# Patient Record
Sex: Male | Born: 1960 | Race: Black or African American | Hispanic: No | Marital: Single | Smoking: Current every day smoker
Health system: Southern US, Community
[De-identification: ages and names within clinical notes are randomized; demographics above are authoritative.]

## PROBLEM LIST (undated history)

## (undated) DIAGNOSIS — E119 Type 2 diabetes mellitus without complications: Secondary | ICD-10-CM

## (undated) DIAGNOSIS — I2699 Other pulmonary embolism without acute cor pulmonale: Secondary | ICD-10-CM

## (undated) DIAGNOSIS — E785 Hyperlipidemia, unspecified: Secondary | ICD-10-CM

## (undated) DIAGNOSIS — M869 Osteomyelitis, unspecified: Secondary | ICD-10-CM

## (undated) DIAGNOSIS — I639 Cerebral infarction, unspecified: Secondary | ICD-10-CM

## (undated) DIAGNOSIS — I10 Essential (primary) hypertension: Secondary | ICD-10-CM

## (undated) DIAGNOSIS — I739 Peripheral vascular disease, unspecified: Secondary | ICD-10-CM

## (undated) DIAGNOSIS — I5189 Other ill-defined heart diseases: Secondary | ICD-10-CM

## (undated) HISTORY — DX: Other pulmonary embolism without acute cor pulmonale: I26.99

## (undated) HISTORY — PX: FACIAL RECONSTRUCTION SURGERY: SHX631

## (undated) HISTORY — DX: Osteomyelitis, unspecified: M86.9

## (undated) HISTORY — DX: Peripheral vascular disease, unspecified: I73.9

## (undated) HISTORY — DX: Essential (primary) hypertension: I10

## (undated) HISTORY — DX: Other ill-defined heart diseases: I51.89

## (undated) HISTORY — DX: Hyperlipidemia, unspecified: E78.5

---
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
## 2006-02-14 ENCOUNTER — Emergency Department: Payer: Self-pay | Admitting: Emergency Medicine

## 2013-12-29 ENCOUNTER — Emergency Department: Payer: Self-pay | Admitting: Emergency Medicine

## 2013-12-29 LAB — URINALYSIS, COMPLETE
BILIRUBIN, UR: NEGATIVE
BLOOD: NEGATIVE
Bacteria: NONE SEEN
KETONE: NEGATIVE
Leukocyte Esterase: NEGATIVE
Nitrite: NEGATIVE
Ph: 5 (ref 4.5–8.0)
Protein: NEGATIVE
Specific Gravity: 1.017 (ref 1.003–1.030)
Squamous Epithelial: NONE SEEN

## 2013-12-29 LAB — TROPONIN I: Troponin-I: <0.02 ng/mL

## 2013-12-29 LAB — BASIC METABOLIC PANEL
Anion Gap: 8 (ref 7–16)
BUN: 8 mg/dL (ref 7–18)
CALCIUM: 8.3 mg/dL — AB (ref 8.5–10.1)
CREATININE: 0.86 mg/dL (ref 0.60–1.30)
Chloride: 99 mmol/L (ref 98–107)
Co2: 28 mmol/L (ref 21–32)
Glucose: 234 mg/dL — ABNORMAL HIGH (ref 65–99)
Osmolality: 276 (ref 275–301)
Potassium: 3.8 mmol/L (ref 3.5–5.1)
SODIUM: 135 mmol/L — AB (ref 136–145)

## 2013-12-29 LAB — CBC WITH DIFFERENTIAL/PLATELET
BASOS PCT: 0.7 %
Basophil #: 0 10*3/uL (ref 0.0–0.1)
EOS ABS: 0.1 10*3/uL (ref 0.0–0.7)
Eosinophil %: 2.2 %
HCT: 45.2 % (ref 40.0–52.0)
HGB: 14.8 g/dL (ref 13.0–18.0)
LYMPHS PCT: 36 %
Lymphocyte #: 2.3 10*3/uL (ref 1.0–3.6)
MCH: 29.9 pg (ref 26.0–34.0)
MCHC: 32.8 g/dL (ref 32.0–36.0)
MCV: 91 fL (ref 80–100)
Monocyte #: 0.3 x10 3/mm (ref 0.2–1.0)
Monocyte %: 4.9 %
NEUTROS ABS: 3.6 10*3/uL (ref 1.4–6.5)
NEUTROS PCT: 56.2 %
Platelet: 143 10*3/uL — ABNORMAL LOW (ref 150–440)
RBC: 4.96 10*6/uL (ref 4.40–5.90)
RDW: 12.8 % (ref 11.5–14.5)
WBC: 6.3 10*3/uL (ref 3.8–10.6)

## 2013-12-29 LAB — HEMOGLOBIN A1C: Hemoglobin A1C: 8.4 % — ABNORMAL HIGH (ref 4.2–6.3)

## 2014-10-30 ENCOUNTER — Emergency Department: Payer: No Typology Code available for payment source

## 2014-10-30 ENCOUNTER — Inpatient Hospital Stay
Admission: EM | Admit: 2014-10-30 | Discharge: 2014-11-03 | DRG: 252 | Disposition: A | Discharge status: Home / Self Care | Payer: No Typology Code available for payment source | Attending: Specialist | Admitting: Specialist

## 2014-10-30 ENCOUNTER — Encounter: Payer: Self-pay | Admitting: Emergency Medicine

## 2014-10-30 DIAGNOSIS — E119 Type 2 diabetes mellitus without complications: Secondary | ICD-10-CM

## 2014-10-30 DIAGNOSIS — Z950 Presence of cardiac pacemaker: Secondary | ICD-10-CM

## 2014-10-30 DIAGNOSIS — M869 Osteomyelitis, unspecified: Secondary | ICD-10-CM | POA: Diagnosis present

## 2014-10-30 DIAGNOSIS — E1152 Type 2 diabetes mellitus with diabetic peripheral angiopathy with gangrene: Secondary | ICD-10-CM | POA: Diagnosis present

## 2014-10-30 DIAGNOSIS — E876 Hypokalemia: Secondary | ICD-10-CM | POA: Diagnosis present

## 2014-10-30 DIAGNOSIS — A480 Gas gangrene: Secondary | ICD-10-CM | POA: Diagnosis present

## 2014-10-30 DIAGNOSIS — R739 Hyperglycemia, unspecified: Secondary | ICD-10-CM

## 2014-10-30 DIAGNOSIS — F17200 Nicotine dependence, unspecified, uncomplicated: Secondary | ICD-10-CM | POA: Diagnosis present

## 2014-10-30 DIAGNOSIS — E1169 Type 2 diabetes mellitus with other specified complication: Secondary | ICD-10-CM | POA: Diagnosis present

## 2014-10-30 DIAGNOSIS — I96 Gangrene, not elsewhere classified: Secondary | ICD-10-CM | POA: Diagnosis present

## 2014-10-30 DIAGNOSIS — Z716 Tobacco abuse counseling: Secondary | ICD-10-CM

## 2014-10-30 DIAGNOSIS — E1165 Type 2 diabetes mellitus with hyperglycemia: Secondary | ICD-10-CM | POA: Diagnosis present

## 2014-10-30 DIAGNOSIS — Z9114 Patient's other noncompliance with medication regimen: Secondary | ICD-10-CM | POA: Diagnosis not present

## 2014-10-30 HISTORY — DX: Type 2 diabetes mellitus without complications: E11.9

## 2014-10-30 LAB — CBC WITH DIFFERENTIAL/PLATELET
Basophils Absolute: 0.1 10*3/uL (ref 0–0.1)
Basophils Relative: 1 %
Eosinophils Absolute: 0.1 10*3/uL (ref 0–0.7)
Eosinophils Relative: 1 %
HCT: 38.1 % — ABNORMAL LOW (ref 40.0–52.0)
Hemoglobin: 12.6 g/dL — ABNORMAL LOW (ref 13.0–18.0)
LYMPHS PCT: 11 %
Lymphs Abs: 1.3 10*3/uL (ref 1.0–3.6)
MCH: 29.2 pg (ref 26.0–34.0)
MCHC: 33.1 g/dL (ref 32.0–36.0)
MCV: 88.4 fL (ref 80.0–100.0)
MONOS PCT: 6 %
Monocytes Absolute: 0.6 10*3/uL (ref 0.2–1.0)
NEUTROS ABS: 9.2 10*3/uL — AB (ref 1.4–6.5)
Neutrophils Relative %: 81 %
PLATELETS: 217 10*3/uL (ref 150–440)
RBC: 4.31 MIL/uL — ABNORMAL LOW (ref 4.40–5.90)
RDW: 12.8 % (ref 11.5–14.5)
WBC: 11.3 10*3/uL — AB (ref 3.8–10.6)

## 2014-10-30 LAB — BASIC METABOLIC PANEL
Anion gap: 8 (ref 5–15)
BUN: 13 mg/dL (ref 6–20)
CO2: 27 mmol/L (ref 22–32)
Calcium: 9 mg/dL (ref 8.9–10.3)
Chloride: 100 mmol/L — ABNORMAL LOW (ref 101–111)
Creatinine, Ser: 0.74 mg/dL (ref 0.61–1.24)
GLUCOSE: 114 mg/dL — AB (ref 65–99)
Potassium: 3.5 mmol/L (ref 3.5–5.1)
Sodium: 135 mmol/L (ref 135–145)

## 2014-10-30 LAB — GLUCOSE, CAPILLARY
GLUCOSE-CAPILLARY: 101 mg/dL — AB (ref 65–99)
GLUCOSE-CAPILLARY: 148 mg/dL — AB (ref 65–99)
Glucose-Capillary: 95 mg/dL (ref 65–99)

## 2014-10-30 LAB — LACTIC ACID, PLASMA: Lactic Acid, Venous: 1.2 mmol/L (ref 0.5–2.0)

## 2014-10-30 MED ORDER — ENOXAPARIN SODIUM 40 MG/0.4ML ~~LOC~~ SOLN
40.0000 mg | SUBCUTANEOUS | Status: DC
  Administered 2014-10-30 – 2014-11-01 (×2): 40 mg via SUBCUTANEOUS
  Filled 2014-10-30 (×3): qty 0.4

## 2014-10-30 MED ORDER — SODIUM CHLORIDE 0.9 % IV BOLUS (SEPSIS)
1000.0000 mL | Freq: Once | INTRAVENOUS | Status: AC
  Administered 2014-10-30: 1000 mL via INTRAVENOUS

## 2014-10-30 MED ORDER — SODIUM CHLORIDE 0.9 % IV SOLN
INTRAVENOUS | Status: DC
  Administered 2014-10-30: 15:00:00 via INTRAVENOUS

## 2014-10-30 MED ORDER — INSULIN ASPART 100 UNIT/ML ~~LOC~~ SOLN
0.0000 [IU] | Freq: Every day | SUBCUTANEOUS | Status: DC
  Administered 2014-11-02: 2 [IU] via SUBCUTANEOUS
  Filled 2014-10-30: qty 2
  Filled 2014-10-30: qty 3

## 2014-10-30 MED ORDER — PIPERACILLIN-TAZOBACTAM 3.375 G IVPB
3.3750 g | Freq: Three times a day (TID) | INTRAVENOUS | Status: DC
  Administered 2014-10-30 – 2014-10-31 (×2): 3.375 g via INTRAVENOUS
  Administered 2014-10-31: 2.25 g via INTRAVENOUS
  Administered 2014-10-31 – 2014-11-03 (×8): 3.375 g via INTRAVENOUS
  Filled 2014-10-30 (×14): qty 50

## 2014-10-30 MED ORDER — VANCOMYCIN HCL 10 G IV SOLR
1250.0000 mg | Freq: Once | INTRAVENOUS | Status: AC
  Administered 2014-10-30: 1250 mg via INTRAVENOUS
  Filled 2014-10-30: qty 1250

## 2014-10-30 MED ORDER — PIPERACILLIN-TAZOBACTAM 3.375 G IVPB 30 MIN
3.3750 g | Freq: Once | INTRAVENOUS | Status: AC
  Administered 2014-10-30: 3.375 g via INTRAVENOUS
  Filled 2014-10-30: qty 50

## 2014-10-30 MED ORDER — ACETAMINOPHEN 325 MG PO TABS
650.0000 mg | ORAL_TABLET | Freq: Four times a day (QID) | ORAL | Status: DC | PRN
  Administered 2014-11-01: 650 mg via ORAL
  Filled 2014-10-30: qty 2

## 2014-10-30 MED ORDER — VANCOMYCIN HCL 10 G IV SOLR
1250.0000 mg | Freq: Two times a day (BID) | INTRAVENOUS | Status: DC
  Administered 2014-10-30 – 2014-11-02 (×6): 1250 mg via INTRAVENOUS
  Filled 2014-10-30 (×8): qty 1250

## 2014-10-30 MED ORDER — SENNOSIDES-DOCUSATE SODIUM 8.6-50 MG PO TABS: 1.0000 | ORAL_TABLET | Freq: Every evening | ORAL | Status: DC | PRN

## 2014-10-30 MED ORDER — INFLUENZA VAC SPLIT QUAD 0.5 ML IM SUSY
0.5000 mL | PREFILLED_SYRINGE | INTRAMUSCULAR | Status: AC
Start: 2014-10-31 — End: 2014-11-01
  Administered 2014-11-01: 0.5 mL via INTRAMUSCULAR
  Filled 2014-10-30: qty 0.5

## 2014-10-30 MED ORDER — ACETAMINOPHEN 650 MG RE SUPP: 650.0000 mg | Freq: Four times a day (QID) | RECTAL | Status: DC | PRN

## 2014-10-30 MED ORDER — ONDANSETRON HCL 4 MG PO TABS: 4.0000 mg | ORAL_TABLET | Freq: Four times a day (QID) | ORAL | Status: DC | PRN

## 2014-10-30 MED ORDER — VANCOMYCIN HCL IN DEXTROSE 1-5 GM/200ML-% IV SOLN: 1000.0000 mg | INTRAVENOUS | Status: DC

## 2014-10-30 MED ORDER — INSULIN ASPART 100 UNIT/ML ~~LOC~~ SOLN
0.0000 [IU] | Freq: Three times a day (TID) | SUBCUTANEOUS | Status: DC
  Administered 2014-10-30 – 2014-11-02 (×2): 1 [IU] via SUBCUTANEOUS
  Administered 2014-11-02 – 2014-11-03 (×2): 2 [IU] via SUBCUTANEOUS
  Filled 2014-10-30 (×2): qty 1

## 2014-10-30 MED ORDER — PIPERACILLIN-TAZOBACTAM 3.375 G IVPB
3.3750 g | Freq: Four times a day (QID) | INTRAVENOUS | Status: DC
Start: 2014-10-30 — End: 2014-10-30

## 2014-10-30 MED ORDER — ONDANSETRON HCL 4 MG/2ML IJ SOLN: 4.0000 mg | Freq: Four times a day (QID) | INTRAMUSCULAR | Status: DC | PRN

## 2014-10-30 NOTE — ED Provider Notes (Addendum)
CSN: [id number]     Arrival date & time 10/30/14  1015 History   First MD Initiated Contact with Patient 10/30/14 1035     Chief Complaint  Patient presents with  . Leg Swelling     (Consider location/radiation/quality/duration/timing/severity/associated sxs/prior Treatment) The history is provided by the patient.  Jourdon Laflash is a 54 y.o. male hx of DM not on meds here with R foot pain. Patient states that he stubbed his toe about 2 weeks ago. States that he had progressively got more painful and swollen. States that the big toe turned black as well with with drainage. Denies fever or chills. He doesn't check his blood sugar and is not taking anything for diabetes.   History reviewed. No pertinent past medical history. History reviewed. No pertinent past surgical history. History reviewed. No pertinent family history. Social History  Substance Use Topics  . Smoking status: Current Every Day Smoker  . Smokeless tobacco: None  . Alcohol Use: No    Review of Systems  Musculoskeletal:       R foot pain   All other systems reviewed and are negative.     Allergies  Review of patient's allergies indicates no known allergies.  Home Medications   Prior to Admission medications   Medication Sig Start Date End Date Taking? Authorizing Provider  naproxen sodium (ANAPROX) 220 MG tablet Take 220 mg by mouth daily as needed (pain).   Yes Historical Provider, MD   BP 136/81 mmHg  Pulse 78  Temp(Src) 98.2 F (36.8 C) (Oral)  Resp 18  Ht 5\' 6"  (1.676 m)  Wt 165 lb (74.844 kg)  BMI 26.64 kg/m2  SpO2 100% Physical Exam  Constitutional: He is oriented to person, place, and time.  Chronically ill   HENT:  Head: Normocephalic.  Mouth/Throat: Oropharynx is clear and moist.  Eyes: Conjunctivae are normal. Pupils are equal, round, and reactive to light.  Neck: Normal range of motion. Neck supple.  Cardiovascular: Normal rate, regular rhythm and normal heart sounds.    Pulmonary/Chest: Effort normal and breath sounds normal. No respiratory distress. He has no wheezes. He has no rales.  Abdominal: Soft. Bowel sounds are normal. He exhibits no distension. There is no tenderness. There is no rebound.  Musculoskeletal: Normal range of motion.  R big toe with obvious gangrene, cellulitis to mid foot. 2+ pedal pulse. Foul smelling. R calf swollen   Neurological: He is alert and oriented to person, place, and time.  Skin: Skin is warm. There is erythema.  Psychiatric: He has a normal mood and affect. His behavior is normal. Judgment and thought content normal.  Nursing note and vitals reviewed.   ED Course  Procedures (including critical care time) Labs Review Labs Reviewed  CBC WITH DIFFERENTIAL/PLATELET - Abnormal; Notable for the following:    WBC 11.3 (*)    RBC 4.31 (*)    Hemoglobin 12.6 (*)    HCT 38.1 (*)    Neutro Abs 9.2 (*)    All other components within normal limits  BASIC METABOLIC PANEL - Abnormal; Notable for the following:    Chloride 100 (*)    Glucose, Bld 114 (*)    All other components within normal limits  GLUCOSE, CAPILLARY - Abnormal; Notable for the following:    Glucose-Capillary 101 (*)    All other components within normal limits  CULTURE, BLOOD (ROUTINE X 2)  CULTURE, BLOOD (ROUTINE X 2)  LACTIC ACID, PLASMA  CBG MONITORING, ED    Imaging  Review US Venous Img Lower Unilateral Right  10/30/2014   CLINICAL DATA:  Right lower extremity pain and swelling for 2 weeks. Evaluate for DVT.  EXAM: RIGHT LOWER EXTREMITY VENOUS DOPPLER ULTRASOUND  TECHNIQUE: Gray-scale sonography with graded compression, as well as color Doppler and duplex ultrasound were performed to evaluate the lower extremity deep venous systems from the level of the common femoral vein and including the common femoral, femoral, profunda femoral, popliteal and calf veins including the posterior tibial, peroneal and gastrocnemius veins when visible. The  superficial great saphenous vein was also interrogated. Spectral Doppler was utilized to evaluate flow at rest and with distal augmentation maneuvers in the common femoral, femoral and popliteal veins.  COMPARISON:  Right foot radiographs - 10/30/2014  FINDINGS: Contralateral Common Femoral Vein: Respiratory phasicity is normal and symmetric with the symptomatic side. No evidence of thrombus. Normal compressibility.  Common Femoral Vein: No evidence of thrombus. Normal compressibility, respiratory phasicity and response to augmentation.  Saphenofemoral Junction: No evidence of thrombus. Normal compressibility and flow on color Doppler imaging.  Profunda Femoral Vein: No evidence of thrombus. Normal compressibility and flow on color Doppler imaging.  Femoral Vein: No evidence of thrombus. Normal compressibility, respiratory phasicity and response to augmentation.  Popliteal Vein: No evidence of thrombus. Normal compressibility, respiratory phasicity and response to augmentation.  Calf Veins: No evidence of thrombus. Normal compressibility and flow on color Doppler imaging.  Superficial Great Saphenous Vein: No evidence of thrombus. Normal compressibility and flow on color Doppler imaging.  Venous Reflux:  None.  Other Findings: Note is made of a benign appearing, non pathologically enlarged likely reactive right inguinal lymph node (image 41).  IMPRESSION: No evidence of DVT within right lower extremity.   Electronically Signed   By: Sandi Mariscal M.D.   On: 10/30/2014 12:13   Dg Foot Complete Right  10/30/2014   CLINICAL DATA:  Foot swelling for 2 weeks. Redness. Ulcerations. Gangrenous appearance.  EXAM: RIGHT FOOT COMPLETE - 3+ VIEW  COMPARISON:  None.  FINDINGS: New diffuse soft tissue swelling. Mild amount of subcutaneous air about the great toe cannot be excluded. This to be consistent with clinical diagnosis of a a gangrenous foot. Infection within the soft tissues of the right great toe should be considered  . Subtle erosion of the distal right first metatarsal noted. This could represent a focal area of osteomyelitis. Joint space involvement is most likely present.  IMPRESSION: 1. Diffuse soft tissue swelling. Soft tissue air is noted about the right great toe suggesting a gangrenous toe with soft tissue infection as clinically suggested. 2. Focal lucency noted the distal right first metatarsal consistent with osteomyelitis. Involvement of the joint space is most likely present. Gadolinium-enhanced MRI of the right foot can be obtained for further evaluation as needed. These results will be called to the ordering clinician or representative by the Radiologist Assistant, and communication documented in the PACS or zVision Dashboard.   Electronically Signed   By: Marcello Moores  Register   On: 10/30/2014 11:14   I have personally reviewed and evaluated these images and lab results as part of my medical decision-making.   EKG Interpretation None     ED ECG REPORT I, Joud Ingwersen, the attending physician, personally viewed and interpreted this ECG.   Date: 10/30/2014  EKG Time: 13:30  Rate: \77  Rhythm: normal EKG, normal sinus rhythm  Axis: normal  Intervals:none  ST&T Change: none    MDM   Final diagnoses:  None  Jediah Gallis is a 54 y.o. male here with gangrene R big toe. Will do sepsis workup and get DVT study and xray.  1:20 PM Xray showed gangrene with air. Called podiatry, Dr. Cleda Mccreedy, who will see patient. WBC 11. CBG 100. Given vanc/zosyn. Hospitalist to admit.     Wandra Arthurs, MD 10/30/14 [place] Leeann Bady, MD 10/30/14 1330

## 2014-10-30 NOTE — Progress Notes (Signed)
ANTIBIOTIC CONSULT NOTE - INITIAL  Pharmacy Consult for Zosyn/Vancomycin Indication: Diabetic Foot Infection/Possible Osteomyelitis  No Known Allergies  Patient Measurements: Height: 5\' 6"  (167.6 cm) Weight: 165 lb (74.844 kg) IBW/kg (Calculated) : 63.8 Adjusted Body Weight:    Vital Signs: Temp: 98.2 F (36.8 C) (10/07 1032) Temp Source: Oral (10/07 1032) BP: 136/81 mmHg (10/07 1301) Pulse Rate: 78 (10/07 1301)  Labs:  Recent Labs  10/30/14 1119  WBC 11.3*  HGB 12.6*  PLT 217  CREATININE 0.74   Estimated Creatinine Clearance: 97.5 mL/min (by C-G formula based on Cr of 0.74).   Microbiology: No results found for this or any previous visit (from the past 720 hour(s)).  Medical History: History reviewed. No pertinent past medical history.  Medications:  Scheduled:  . enoxaparin (LOVENOX) injection  40 mg Subcutaneous Q24H  . insulin aspart  0-5 Units Subcutaneous QHS  . insulin aspart  0-9 Units Subcutaneous TID WC   Anti-infectives    Start     Dose/Rate Route Frequency Ordered Stop   10/31/14 0000  vancomycin (VANCOCIN) IVPB 1000 mg/200 mL premix  Status:  Discontinued     1,000 mg 200 mL/hr over 60 Minutes Intravenous Every 24 hours 10/30/14 1325 10/30/14 1420   10/30/14 1500  vancomycin (VANCOCIN) 1,250 mg in sodium chloride 0.9 % 250 mL IVPB     1,250 mg 166.7 mL/hr over 90 Minutes Intravenous  Once 10/30/14 1420     10/30/14 1415  piperacillin-tazobactam (ZOSYN) IVPB 3.375 g     3.375 g 100 mL/hr over 30 Minutes Intravenous  Once 10/30/14 1413     10/30/14 1330  piperacillin-tazobactam (ZOSYN) IVPB 3.375 g  Status:  Discontinued     3.375 g 12.5 mL/hr over 240 Minutes Intravenous 4 times per day 10/30/14 1325 10/30/14 1413     Assessment: 54 yo male with gangrenous right great toe, possible Osteomyelitis. Hx DM. Possible amputation?  Ke 0.085  T1/2 8.15  Vd 52.36  Goal of Therapy:  Vancomycin trough level 15-20 mcg/ml  Plan:  Measure  antibiotic drug levels at steady state Follow up culture results Will order EI Zosyn 3.375gm IV q8h  Will order Vancomycin 1250mg  IV Q12h with 2nd dose to start ~7 hrs after 1st for stacked dosing. Will order Vancomycin trough prior to 5th dose on 10/9 at 0930.  Chinita Greenland PharmD Clinical Pharmacist 10/30/2014 2:36 PM

## 2014-10-30 NOTE — Consult Note (Signed)
[facility] Vascular Consult Note  MRN : [id number]  [name] is a 54 y.o. (Oct 18, 1960) male who presents with chief complaint of  Chief Complaint  Patient presents with  . Leg Swelling  .  History of Present Illness: Patient presents with 3 week history of foul smelling, dark discoloration of the right great toe.  This is mildly painful.  He has a fever with signs of infection and was found to have gangrenous changes.  For OR debridement tomorrow.  We are asked to see him to assess perfusion.  No claudication.  No ischemic rest pain  Current Facility-Administered Medications  Medication Dose Route Frequency Provider Last Rate Last Dose  . 0.9 %  sodium chloride infusion   Intravenous Continuous Epifanio Lesches, MD 75 mL/hr at 10/30/14 1454    . acetaminophen (TYLENOL) tablet 650 mg  650 mg Oral Q6H PRN Epifanio Lesches, MD       Or  . acetaminophen (TYLENOL) suppository 650 mg  650 mg Rectal Q6H PRN Epifanio Lesches, MD      . enoxaparin (LOVENOX) injection 40 mg  40 mg Subcutaneous Q24H Epifanio Lesches, MD   40 mg at 10/30/14 1618  . insulin aspart (novoLOG) injection 0-5 Units  0-5 Units Subcutaneous QHS Epifanio Lesches, MD      . insulin aspart (novoLOG) injection 0-9 Units  0-9 Units Subcutaneous TID WC Epifanio Lesches, MD   1 Units at 10/30/14 1644  . ondansetron (ZOFRAN) tablet 4 mg  4 mg Oral Q6H PRN Epifanio Lesches, MD       Or  . ondansetron (ZOFRAN) injection 4 mg  4 mg Intravenous Q6H PRN Epifanio Lesches, MD      . senna-docusate (Senokot-S) tablet 1 tablet  1 tablet Oral QHS PRN Epifanio Lesches, MD        History reviewed. No pertinent past medical history.  History reviewed. No pertinent past surgical history.  Social History Social History  Substance Use Topics  . Smoking status: Current Every Day Smoker  . Smokeless tobacco: None  . Alcohol Use: No  No IVDU  Family History No bleeding disorders,  clotting disorders, or autoimmune diseases  No Known Allergies   REVIEW OF SYSTEMS (Negative unless checked)  Constitutional: [] Weight loss  [] Fever  [] Chills Cardiac: [] Chest pain   [] Chest pressure   [] Palpitations   [] Shortness of breath when laying flat   [] Shortness of breath at rest   [] Shortness of breath with exertion. Vascular:  [] Pain in legs with walking   [] Pain in legs at rest   [] Pain in legs when laying flat   [] Claudication   [] Pain in feet when walking  [] Pain in feet at rest  [] Pain in feet when laying flat   [] History of DVT   [] Phlebitis   [x] Swelling in legs   [] Varicose veins   [x] Non-healing ulcers Pulmonary:   [] Uses home oxygen   [] Productive cough   [] Hemoptysis   [] Wheeze  [] COPD   [] Asthma Neurologic:  [] Dizziness  [] Blackouts   [] Seizures   [] History of stroke   [] History of TIA  [] Aphasia   [] Temporary blindness   [] Dysphagia   [] Weakness or numbness in arms   [] Weakness or numbness in legs Musculoskeletal:  [] Arthritis   [] Joint swelling   [] Joint pain   [] Low back pain Hematologic:  [] Easy bruising  [] Easy bleeding   [] Hypercoagulable state   [] Anemic  [] Hepatitis Gastrointestinal:  [] Blood in stool   [] Vomiting blood  [] Gastroesophageal reflux/heartburn   [] Difficulty  swallowing. Genitourinary:  [] Chronic kidney disease   [] Difficult urination  [] Frequent urination  [] Burning with urination   [] Blood in urine Skin:  [] Rashes   [x] Ulcers   [] Wounds Psychological:  [] History of anxiety   []  History of major depression.  Physical Examination  Filed Vitals:   10/30/14 1032 10/30/14 1301 10/30/14 1450 10/30/14 1506  BP: 139/76 136/81 122/69 136/68  Pulse: 81 78 72 82  Temp: 98.2 F (36.8 C)   98.5 F (36.9 C)  TempSrc: Oral   Oral  Resp: 20 18 18 18   Height: 5\' 6"  (1.676 m)     Weight: 74.844 kg (165 lb)     SpO2: 100% 100% 99% 100%   Body mass index is 26.64 kg/(m^2). Gen:  WD/WN, NAD Head: [place]/AT, No temporalis wasting. Prominent temp pulse not  noted. Ear/Nose/Throat: Hearing grossly intact, nares w/o erythema or drainage, oropharynx w/o Erythema/Exudate Eyes: PERRLA, EOMI.  Neck: Supple, no nuchal rigidity.  No bruit or JVD.  Pulmonary:  Good air movement, clear to auscultation bilaterally.  Cardiac: RRR, normal S1, S2, no Murmurs, rubs or gallops. Vascular:  Vessel Right Left  Radial Palpable Palpable  Ulnar Palpable Palpable  Brachial Palpable Palpable  Carotid Palpable, without bruit Palpable, without bruit  Aorta Not palpable N/A  Femoral Palpable Palpable  Popliteal Weakly Palpable Palpable  PT Weakly Palpable Palpable  DP Palpable Palpable   Gastrointestinal: soft, non-tender/non-distended. No guarding/reflex. No masses, surgical incisions, or scars. Musculoskeletal: M/S 5/5 throughout.  Extremities without ischemic changes.  No deformity or atrophy. RLE 2+ edema. Neurologic: CN 2-12 intact. Pain and light touch intact in extremities.  Symmetrical.  Speech is fluent. Motor exam as listed above. Psychiatric: Judgment intact, Mood & affect appropriate for pt's clinical situation. Dermatologic: Right great toe with gangrenous changes and a foul odor. Lymph : No Cervical, Axillary, or Inguinal lymphadenopathy.      CBC Lab Results  Component Value Date   WBC 11.3* 10/30/2014   HGB 12.6* 10/30/2014   HCT 38.1* 10/30/2014   MCV 88.4 10/30/2014   PLT 217 10/30/2014    BMET    Component Value Date/Time   NA 135 10/30/2014 1119   NA 135* 12/29/2013 1214   K 3.5 10/30/2014 1119   K 3.8 12/29/2013 1214   CL 100* 10/30/2014 1119   CL 99 12/29/2013 1214   CO2 27 10/30/2014 1119   CO2 28 12/29/2013 1214   GLUCOSE 114* 10/30/2014 1119   GLUCOSE 234* 12/29/2013 1214   BUN 13 10/30/2014 1119   BUN 8 12/29/2013 1214   CREATININE 0.74 10/30/2014 1119   CREATININE 0.86 12/29/2013 1214   CALCIUM 9.0 10/30/2014 1119   CALCIUM 8.3* 12/29/2013 1214   GFRNONAA >60 10/30/2014 1119   GFRNONAA >60 12/29/2013 1214    GFRAA >60 10/30/2014 1119   GFRAA >60 12/29/2013 1214   Estimated Creatinine Clearance: 97.5 mL/min (by C-G formula based on Cr of 0.74).  COAG No results found for: INR, PROTIME  Radiology US Venous Img Lower Unilateral Right  10/30/2014   CLINICAL DATA:  Right lower extremity pain and swelling for 2 weeks. Evaluate for DVT.  EXAM: RIGHT LOWER EXTREMITY VENOUS DOPPLER ULTRASOUND  TECHNIQUE: Gray-scale sonography with graded compression, as well as color Doppler and duplex ultrasound were performed to evaluate the lower extremity deep venous systems from the level of the common femoral vein and including the common femoral, femoral, profunda femoral, popliteal and calf veins including the posterior tibial, peroneal and gastrocnemius veins when visible.  The superficial great saphenous vein was also interrogated. Spectral Doppler was utilized to evaluate flow at rest and with distal augmentation maneuvers in the common femoral, femoral and popliteal veins.  COMPARISON:  Right foot radiographs - 10/30/2014  FINDINGS: Contralateral Common Femoral Vein: Respiratory phasicity is normal and symmetric with the symptomatic side. No evidence of thrombus. Normal compressibility.  Common Femoral Vein: No evidence of thrombus. Normal compressibility, respiratory phasicity and response to augmentation.  Saphenofemoral Junction: No evidence of thrombus. Normal compressibility and flow on color Doppler imaging.  Profunda Femoral Vein: No evidence of thrombus. Normal compressibility and flow on color Doppler imaging.  Femoral Vein: No evidence of thrombus. Normal compressibility, respiratory phasicity and response to augmentation.  Popliteal Vein: No evidence of thrombus. Normal compressibility, respiratory phasicity and response to augmentation.  Calf Veins: No evidence of thrombus. Normal compressibility and flow on color Doppler imaging.  Superficial Great Saphenous Vein: No evidence of thrombus. Normal compressibility  and flow on color Doppler imaging.  Venous Reflux:  None.  Other Findings: Note is made of a benign appearing, non pathologically enlarged likely reactive right inguinal lymph node (image 41).  IMPRESSION: No evidence of DVT within right lower extremity.   Electronically Signed   By: Sandi Mariscal M.D.   On: 10/30/2014 12:13   Dg Foot Complete Right  10/30/2014   CLINICAL DATA:  Foot swelling for 2 weeks. Redness. Ulcerations. Gangrenous appearance.  EXAM: RIGHT FOOT COMPLETE - 3+ VIEW  COMPARISON:  None.  FINDINGS: New diffuse soft tissue swelling. Mild amount of subcutaneous air about the great toe cannot be excluded. This to be consistent with clinical diagnosis of a a gangrenous foot. Infection within the soft tissues of the right great toe should be considered . Subtle erosion of the distal right first metatarsal noted. This could represent a focal area of osteomyelitis. Joint space involvement is most likely present.  IMPRESSION: 1. Diffuse soft tissue swelling. Soft tissue air is noted about the right great toe suggesting a gangrenous toe with soft tissue infection as clinically suggested. 2. Focal lucency noted the distal right first metatarsal consistent with osteomyelitis. Involvement of the joint space is most likely present. Gadolinium-enhanced MRI of the right foot can be obtained for further evaluation as needed. These results will be called to the ordering clinician or representative by the Radiologist Assistant, and communication documented in the PACS or zVision Dashboard.   Electronically Signed   By: [place]   On: 10/30/2014 11:14     Assessment/Plan 1. PAD with gangrene RLE. Although his pulses can be felt distally, given the clear limb threatening nature of the situation and our lack of noninvasive studies at this institution, I would recommend an angiogram be performed for further evaluation. This will be scheduled for Monday, October 10. Risks and benefits were discussed with  the patient. He was agreeable to proceed. 2. Tobacco dependence.  Makes pad more likely. Tobacco cessation recommended   Haily Caley, MD  10/30/2014 6:27 PM

## 2014-10-30 NOTE — H&P (Signed)
[facility] [place] NAME: [name]    MR#:  [id number]  DATE OF BIRTH:  Mar 25, 1960  DATE OF ADMISSION:  10/30/2014  PRIMARY CARE PHYSICIAN: No PCP Per Patient   REQUESTING/REFERRING PHYSICIAN: Dr.Yao  CHIEF COMPLAINT:   Chief Complaint  Patient presents with  . Leg Swelling    HISTORY OF PRESENT ILLNESS:  [name]  is a 54 y.o. male with a known history of diabetes not on medications for the past 1 year and noncompliant with medicines, heavy tobacco abuse comes in with worsening infection of the right great toe. Started with blister on the bottom of the right great toe about 2 weeks ago associated with pain. Patient blister popped up, has foul-smelling drainage for the first few days, patient turned black in the last 2 days. Fever, chills. Patient does not take any medication for diabetes.  PAST MEDICAL HISTORY:  History reviewed. No pertinent past medical history.  PAST SURGICAL HISTOIRY:  History reviewed. No pertinent past surgical history.  SOCIAL HISTORY:   Social History  Substance Use Topics  . Smoking status: Current Every Day Smoker  . Smokeless tobacco: Not on file  . Alcohol Use: No    FAMILY HISTORY:  History reviewed. No pertinent family history.  DRUG ALLERGIES:  No Known Allergies  REVIEW OF SYSTEMS:  CONSTITUTIONAL: No fever, fatigue or weakness.  EYES: No blurred or double vision.  EARS, NOSE, AND THROAT: No tinnitus or ear pain.  RESPIRATORY: No cough, shortness of breath, wheezing or hemoptysis.  CARDIOVASCULAR: No chest pain, orthopnea, edema.  GASTROINTESTINAL: No nausea, vomiting, diarrhea or abdominal pain.  GENITOURINARY: No dysuria, hematuria.  ENDOCRINE: No polyuria, nocturia,  HEMATOLOGY: No anemia, easy bruising or bleeding SKIN: No rash or lesion. MUSCULOSKELETAL: Right great toe discharge and pain. NEUROLOGIC: No tingling, numbness, weakness.  PSYCHIATRY: No anxiety or  depression.   MEDICATIONS AT HOME:   Prior to Admission medications   Medication Sig Start Date End Date Taking? Authorizing Provider  naproxen sodium (ANAPROX) 220 MG tablet Take 220 mg by mouth daily as needed (pain).   Yes Historical Provider, MD      VITAL SIGNS:  Blood pressure 136/81, pulse 78, temperature 98.2 F (36.8 C), temperature source Oral, resp. rate 18, height 5\' 6"  (1.676 m), weight 74.844 kg (165 lb), SpO2 100 %.  PHYSICAL EXAMINATION:  GENERAL:  54 y.o.-year-old patient lying in the bed with no acute distress.  EYES: Pupils equal, round, reactive to light and accommodation. No scleral icterus. Extraocular muscles intact.  HEENT: Head atraumatic, normocephalic. Oropharynx and nasopharynx clear.  NECK:  Supple, no jugular venous distention. No thyroid enlargement, no tenderness.  LUNGS: Normal breath sounds bilaterally, no wheezing, rales,rhonchi or crepitation. No use of accessory muscles of respiration.  CARDIOVASCULAR: S1, S2 normal. No murmurs, rubs, or gallops.  ABDOMEN: Soft, nontender, nondistended. Bowel sounds present. No organomegaly or mass.  EXTREMITIES: Peripheral pulses the dorsalis pedis on both feet, right leg edema present up to the ankle, the area of black discoloration with ulcer and foul-smelling drainage on the bottom of the right great toe .  NEUROLOGIC: Cranial nerves II through XII are intact. Muscle strength 5/5 in all extremities. Sensation intact. Gait not checked PSYCHIATRIC: The patient is alert and oriented x 3.  SKIN: Ulcer on the bottom of  right great toe  LABORATORY PANEL:   CBC  Recent Labs Lab 10/30/14 1119  WBC 11.3*  HGB 12.6*  HCT 38.1*  PLT 217   ------------------------------------------------------------------------------------------------------------------  Chemistries   Recent Labs Lab 10/30/14 1119  NA 135  K 3.5  CL 100*  CO2 27  GLUCOSE 114*  BUN 13  CREATININE 0.74  CALCIUM 9.0    ------------------------------------------------------------------------------------------------------------------  Cardiac Enzymes No results for input(s): TROPONINI in the last 168 hours. ------------------------------------------------------------------------------------------------------------------  RADIOLOGY:  US Venous Img Lower Unilateral Right  10/30/2014   CLINICAL DATA:  Right lower extremity pain and swelling for 2 weeks. Evaluate for DVT.  EXAM: RIGHT LOWER EXTREMITY VENOUS DOPPLER ULTRASOUND  TECHNIQUE: Gray-scale sonography with graded compression, as well as color Doppler and duplex ultrasound were performed to evaluate the lower extremity deep venous systems from the level of the common femoral vein and including the common femoral, femoral, profunda femoral, popliteal and calf veins including the posterior tibial, peroneal and gastrocnemius veins when visible. The superficial great saphenous vein was also interrogated. Spectral Doppler was utilized to evaluate flow at rest and with distal augmentation maneuvers in the common femoral, femoral and popliteal veins.  COMPARISON:  Right foot radiographs - 10/30/2014  FINDINGS: Contralateral Common Femoral Vein: Respiratory phasicity is normal and symmetric with the symptomatic side. No evidence of thrombus. Normal compressibility.  Common Femoral Vein: No evidence of thrombus. Normal compressibility, respiratory phasicity and response to augmentation.  Saphenofemoral Junction: No evidence of thrombus. Normal compressibility and flow on color Doppler imaging.  Profunda Femoral Vein: No evidence of thrombus. Normal compressibility and flow on color Doppler imaging.  Femoral Vein: No evidence of thrombus. Normal compressibility, respiratory phasicity and response to augmentation.  Popliteal Vein: No evidence of thrombus. Normal compressibility, respiratory phasicity and response to augmentation.  Calf Veins: No evidence of thrombus. Normal  compressibility and flow on color Doppler imaging.  Superficial Great Saphenous Vein: No evidence of thrombus. Normal compressibility and flow on color Doppler imaging.  Venous Reflux:  None.  Other Findings: Note is made of a benign appearing, non pathologically enlarged likely reactive right inguinal lymph node (image 41).  IMPRESSION: No evidence of DVT within right lower extremity.   Electronically Signed   By: Sandi Mariscal M.D.   On: 10/30/2014 12:13   Dg Foot Complete Right  10/30/2014   CLINICAL DATA:  Foot swelling for 2 weeks. Redness. Ulcerations. Gangrenous appearance.  EXAM: RIGHT FOOT COMPLETE - 3+ VIEW  COMPARISON:  None.  FINDINGS: New diffuse soft tissue swelling. Mild amount of subcutaneous air about the great toe cannot be excluded. This to be consistent with clinical diagnosis of a a gangrenous foot. Infection within the soft tissues of the right great toe should be considered . Subtle erosion of the distal right first metatarsal noted. This could represent a focal area of osteomyelitis. Joint space involvement is most likely present.  IMPRESSION: 1. Diffuse soft tissue swelling. Soft tissue air is noted about the right great toe suggesting a gangrenous toe with soft tissue infection as clinically suggested. 2. Focal lucency noted the distal right first metatarsal consistent with osteomyelitis. Involvement of the joint space is most likely present. Gadolinium-enhanced MRI of the right foot can be obtained for further evaluation as needed. These results will be called to the ordering clinician or representative by the Radiologist Assistant, and communication documented in the PACS or zVision Dashboard.   Electronically Signed   By: Marcello Moores  Register   On: 10/30/2014 11:14    EKG:   Orders placed or performed during the hospital encounter of 10/30/14  . EKG 12-Lead  . EKG 12-Lead  paroxysmal sinus rhythm with 77 bpm no ST-T changes.  IMPRESSION AND PLAN:  35. 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 Admit to medical service, start vancomycin, Zosyn, podiatriy consult for possible toe amputation of the right great toe Diabetes mellitus  type 2: Noncompliance with medications not taking medicine for almost more than an year. Patient will get the hemoglobin A1c, start the insulin and sliding scale with coverage, he was on metformin before , if no surgical or contrast-induced procedure is planned ,can start Metformin. 3. Tobacco abuse: Counseled again is smoking for  8 minutes.  GI, DVT prophylaxis   All the records are reviewed and case discussed with ED provider. Management plans discussed with the patient, family and they are in agreement.  CODE STATUS: full TOTAL TIME TAKING CARE OF THIS PATIENT:55 minutes.    Epifanio Lesches M.D on 10/30/2014 at 1:40 PM  Between 7am to 6pm - Pager - [phone]  After 6pm go to [email or web address] - password [email or web address] [facility]  [facility]  Office  [phone]  CC: Primary care physician; No PCP Per Patient

## 2014-10-30 NOTE — Progress Notes (Signed)
righrt great toe darker ;necrotic with foul odor present

## 2014-10-30 NOTE — Consult Note (Signed)
Reason for Consult: Gangrene with osteomyelitis right great toe Referring Physician: [facility]  [name] is an 54 y.o. male.  HPI: The patient relates that this started a couple of weeks ago when he injured the right great toe. He noticed a blister on the bottom of the toe that just recently within the last few days started to turn black and discolored. Started to notice some odor from the area. Presented to the emergency department where x-rays confirmed gas gangrene in the soft tissues with some erosive changes in the bone of the great toe.  History reviewed. No pertinent past medical history.  History reviewed. No pertinent past surgical history.  History reviewed. No pertinent family history.  Social History:  reports that he has been smoking.  He does not have any smokeless tobacco history on file. He reports that he does not drink alcohol or use illicit drugs.  Allergies: No Known Allergies  Medications: I have reviewed the patient's current medications.  Results for orders placed or performed during the hospital encounter of 10/30/14 (from the past 48 hour(s))  CBC with Differential/Platelet     Status: Abnormal   Collection Time: 10/30/14 11:19 AM  Result Value Ref Range   WBC 11.3 (H) 3.8 - 10.6 K/uL   RBC 4.31 (L) 4.40 - 5.90 MIL/uL   Hemoglobin 12.6 (L) 13.0 - 18.0 g/dL   HCT 38.1 (L) 40.0 - 52.0 %   MCV 88.4 80.0 - 100.0 fL   MCH 29.2 26.0 - 34.0 pg   MCHC 33.1 32.0 - 36.0 g/dL   RDW 12.8 11.5 - 14.5 %   Platelets 217 150 - 440 K/uL   Neutrophils Relative % 81 %   Neutro Abs 9.2 (H) 1.4 - 6.5 K/uL   Lymphocytes Relative 11 %   Lymphs Abs 1.3 1.0 - 3.6 K/uL   Monocytes Relative 6 %   Monocytes Absolute 0.6 0.2 - 1.0 K/uL   Eosinophils Relative 1 %   Eosinophils Absolute 0.1 0 - 0.7 K/uL   Basophils Relative 1 %   Basophils Absolute 0.1 0 - 0.1 K/uL  Basic metabolic panel     Status: Abnormal   Collection Time: 10/30/14 11:19 AM  Result  Value Ref Range   Sodium 135 135 - 145 mmol/L   Potassium 3.5 3.5 - 5.1 mmol/L   Chloride 100 (L) 101 - 111 mmol/L   CO2 27 22 - 32 mmol/L   Glucose, Bld 114 (H) 65 - 99 mg/dL   BUN 13 6 - 20 mg/dL   Creatinine, Ser 0.74 0.61 - 1.24 mg/dL   Calcium 9.0 8.9 - 10.3 mg/dL   GFR calc non Af Amer >60 >60 mL/min   GFR calc Af Amer >60 >60 mL/min    Comment: (NOTE) The eGFR has been calculated using the CKD EPI equation. This calculation has not been validated in all clinical situations. eGFR's persistently <60 mL/min signify possible Chronic Kidney Disease.    Anion gap 8 5 - 15  Lactic acid, plasma     Status: None   Collection Time: 10/30/14 11:19 AM  Result Value Ref Range   Lactic Acid, Venous 1.2 0.5 - 2.0 mmol/L  Glucose, capillary     Status: Abnormal   Collection Time: 10/30/14 12:09 PM  Result Value Ref Range   Glucose-Capillary 101 (H) 65 - 99 mg/dL  Glucose, capillary     Status: Abnormal   Collection Time: 10/30/14  4:23 PM  Result Value Ref Range  Glucose-Capillary 148 (H) 65 - 99 mg/dL   Comment 1 Notify RN     US Venous Img Lower Unilateral Right  10/30/2014   CLINICAL DATA:  Right lower extremity pain and swelling for 2 weeks. Evaluate for DVT.  EXAM: RIGHT LOWER EXTREMITY VENOUS DOPPLER ULTRASOUND  TECHNIQUE: Gray-scale sonography with graded compression, as well as color Doppler and duplex ultrasound were performed to evaluate the lower extremity deep venous systems from the level of the common femoral vein and including the common femoral, femoral, profunda femoral, popliteal and calf veins including the posterior tibial, peroneal and gastrocnemius veins when visible. The superficial great saphenous vein was also interrogated. Spectral Doppler was utilized to evaluate flow at rest and with distal augmentation maneuvers in the common femoral, femoral and popliteal veins.  COMPARISON:  Right foot radiographs - 10/30/2014  FINDINGS: Contralateral Common Femoral Vein:  Respiratory phasicity is normal and symmetric with the symptomatic side. No evidence of thrombus. Normal compressibility.  Common Femoral Vein: No evidence of thrombus. Normal compressibility, respiratory phasicity and response to augmentation.  Saphenofemoral Junction: No evidence of thrombus. Normal compressibility and flow on color Doppler imaging.  Profunda Femoral Vein: No evidence of thrombus. Normal compressibility and flow on color Doppler imaging.  Femoral Vein: No evidence of thrombus. Normal compressibility, respiratory phasicity and response to augmentation.  Popliteal Vein: No evidence of thrombus. Normal compressibility, respiratory phasicity and response to augmentation.  Calf Veins: No evidence of thrombus. Normal compressibility and flow on color Doppler imaging.  Superficial Great Saphenous Vein: No evidence of thrombus. Normal compressibility and flow on color Doppler imaging.  Venous Reflux:  None.  Other Findings: Note is made of a benign appearing, non pathologically enlarged likely reactive right inguinal lymph node (image 41).  IMPRESSION: No evidence of DVT within right lower extremity.   Electronically Signed   By: Sandi Mariscal M.D.   On: 10/30/2014 12:13   Dg Foot Complete Right  10/30/2014   CLINICAL DATA:  Foot swelling for 2 weeks. Redness. Ulcerations. Gangrenous appearance.  EXAM: RIGHT FOOT COMPLETE - 3+ VIEW  COMPARISON:  None.  FINDINGS: New diffuse soft tissue swelling. Mild amount of subcutaneous air about the great toe cannot be excluded. This to be consistent with clinical diagnosis of a a gangrenous foot. Infection within the soft tissues of the right great toe should be considered . Subtle erosion of the distal right first metatarsal noted. This could represent a focal area of osteomyelitis. Joint space involvement is most likely present.  IMPRESSION: 1. Diffuse soft tissue swelling. Soft tissue air is noted about the right great toe suggesting a gangrenous toe with soft  tissue infection as clinically suggested. 2. Focal lucency noted the distal right first metatarsal consistent with osteomyelitis. Involvement of the joint space is most likely present. Gadolinium-enhanced MRI of the right foot can be obtained for further evaluation as needed. These results will be called to the ordering clinician or representative by the Radiologist Assistant, and communication documented in the PACS or zVision Dashboard.   Electronically Signed   By: [place]   On: 10/30/2014 11:14    Review of Systems  Constitutional: Negative.   HENT: Negative.   Eyes: Negative.   Cardiovascular: Negative.   Gastrointestinal: Negative.   Genitourinary: Negative.   Musculoskeletal: Negative.   Skin:       Drainage from the ulceration on the bottom of his right great toe. Has noticed some malodor with this as well. Redness and discoloration in the  right foot  Neurological:       Relates some occasional paresthesias and tingling in the feet.  Endo/Heme/Allergies: Negative.   Psychiatric/Behavioral: Negative.    Blood pressure 136/68, pulse 82, temperature 98.5 F (36.9 C), temperature source Oral, resp. rate 18, height _0  (1.676 m), weight 74.844 kg (165 lb), SpO2 100 %. Physical Exam  Cardiovascular:  DP and PT pulses are barely palpable bilateral. Capillary filling time absent to the right hallux  Musculoskeletal:  Adequate range of motion of the pedal joints. Muscle testing is normal.  Neurological:  Protective threshold monofilament wire appears to be intact to the digits and forefoot.  Skin:  The skin is warm dry and somewhat atrophic with some diminished hair growth distally in the forefoot and digits. Cyanosis is present in the distal right hallux with a full-thickness malodorous ulceration with gangrenous tissue along the plantar aspect of the hallux IPJ. Some discoloration is noted in the distal forefoot with some mild erythema. Some edema is noted generalized in the  right foot and lower leg as compared to the left.    Assessment/Plan: Assessment: 1. Gangrene with osteomyelitis right great toe. 2. Diabetes mellitus poorly controlled. 3. Evidence for peripheral vascular disease. Plan: Discussed with the patient the need for amputation of his right great toe due to the gangrenous changes. We also discussed that with his diabetes under poor control as well as his long history of smoking that he is at a much higher risk for vascular disease and nonhealing following the surgery. We will obtain a vascular consult but plan to proceed with the amputation for tomorrow. Consent form for amputation of the right great toe. Nothing by mouth after midnight.  Sriya Kroeze W. 10/30/2014, 5:35 PM

## 2014-10-30 NOTE — Progress Notes (Signed)
diabetec ulcer

## 2014-10-30 NOTE — ED Notes (Signed)
Pt to ed with c/o right leg and foot swelling x 2 weeks.  Denies having PMD. Pt also reports pain worse with walking in right leg.

## 2014-10-31 ENCOUNTER — Inpatient Hospital Stay: Payer: No Typology Code available for payment source | Admitting: Anesthesiology

## 2014-10-31 ENCOUNTER — Encounter
Admission: EM | Disposition: A | Discharge status: Home / Self Care | Payer: Self-pay | Source: Home / Self Care | Attending: Specialist

## 2014-10-31 HISTORY — PX: AMPUTATION TOE: SHX6595

## 2014-10-31 LAB — GLUCOSE, CAPILLARY
GLUCOSE-CAPILLARY: 73 mg/dL (ref 65–99)
GLUCOSE-CAPILLARY: 110 mg/dL — AB (ref 65–99)
Glucose-Capillary: 83 mg/dL (ref 65–99)
Glucose-Capillary: 109 mg/dL — ABNORMAL HIGH (ref 65–99)

## 2014-10-31 LAB — BASIC METABOLIC PANEL
ANION GAP: 6 (ref 5–15)
BUN: 13 mg/dL (ref 6–20)
CO2: 24 mmol/L (ref 22–32)
Calcium: 8.3 mg/dL — ABNORMAL LOW (ref 8.9–10.3)
Chloride: 108 mmol/L (ref 101–111)
Creatinine, Ser: 0.72 mg/dL (ref 0.61–1.24)
GFR calc Af Amer: >60 mL/min (ref >60)
GFR calc non Af Amer: >60 mL/min (ref >60)
GLUCOSE: 71 mg/dL (ref 65–99)
POTASSIUM: 3.3 mmol/L — AB (ref 3.5–5.1)
Sodium: 138 mmol/L (ref 135–145)

## 2014-10-31 LAB — CBC
HEMATOCRIT: 33.1 % — AB (ref 40.0–52.0)
HEMOGLOBIN: 10.9 g/dL — AB (ref 13.0–18.0)
MCH: 29.4 pg (ref 26.0–34.0)
MCHC: 32.9 g/dL (ref 32.0–36.0)
MCV: 89.1 fL (ref 80.0–100.0)
Platelets: 204 10*3/uL (ref 150–440)
RBC: 3.72 MIL/uL — ABNORMAL LOW (ref 4.40–5.90)
RDW: 12.8 % (ref 11.5–14.5)
WBC: 9.3 10*3/uL (ref 3.8–10.6)

## 2014-10-31 LAB — HEMOGLOBIN A1C: Hgb A1c MFr Bld: 8 % — ABNORMAL HIGH (ref 4.0–6.0)

## 2014-10-31 LAB — SURGICAL PCR SCREEN
MRSA, PCR: NEGATIVE
STAPHYLOCOCCUS AUREUS: POSITIVE — AB

## 2014-10-31 SURGERY — AMPUTATION, TOE
Anesthesia: Monitor Anesthesia Care | Laterality: Right

## 2014-10-31 MED ORDER — FENTANYL CITRATE (PF) 100 MCG/2ML IJ SOLN
INTRAMUSCULAR | Status: DC | PRN
  Administered 2014-10-31: 50 ug via INTRAVENOUS

## 2014-10-31 MED ORDER — DEXTROSE 5 % IV SOLN
Freq: Once | INTRAVENOUS | Status: AC
  Administered 2014-10-31: 09:00:00 via INTRAVENOUS

## 2014-10-31 MED ORDER — MORPHINE SULFATE (PF) 4 MG/ML IV SOLN: 4.0000 mg | INTRAVENOUS | Status: DC | PRN

## 2014-10-31 MED ORDER — SODIUM CHLORIDE 0.9 % IV SOLN
INTRAVENOUS | Status: DC | PRN
  Administered 2014-10-31: 14:00:00 via INTRAVENOUS

## 2014-10-31 MED ORDER — NEOMYCIN-POLYMYXIN B GU 40-200000 IR SOLN
Status: DC | PRN
  Administered 2014-10-31: 2 mL

## 2014-10-31 MED ORDER — BUPIVACAINE HCL 0.5 % IJ SOLN
INTRAMUSCULAR | Status: DC | PRN
  Administered 2014-10-31: 10 mL

## 2014-10-31 MED ORDER — BUPIVACAINE HCL (PF) 0.5 % IJ SOLN
INTRAMUSCULAR | Status: AC
  Filled 2014-10-31: qty 30

## 2014-10-31 MED ORDER — SODIUM CHLORIDE 0.9 % IJ SOLN
3.0000 mL | Freq: Two times a day (BID) | INTRAMUSCULAR | Status: DC
  Administered 2014-11-01 – 2014-11-03 (×5): 3 mL via INTRAVENOUS

## 2014-10-31 MED ORDER — NEOMYCIN-POLYMYXIN B GU 40-200000 IR SOLN
Status: AC
  Filled 2014-10-31: qty 2

## 2014-10-31 MED ORDER — EPHEDRINE SULFATE 50 MG/ML IJ SOLN
INTRAMUSCULAR | Status: DC | PRN
  Administered 2014-10-31: 10 mg via INTRAVENOUS

## 2014-10-31 MED ORDER — PROMETHAZINE HCL 25 MG PO TABS: 12.5000 mg | ORAL_TABLET | ORAL | Status: DC | PRN

## 2014-10-31 MED ORDER — LIDOCAINE HCL (CARDIAC) 20 MG/ML IV SOLN
INTRAVENOUS | Status: DC | PRN
  Administered 2014-10-31: 60 mg via INTRAVENOUS

## 2014-10-31 MED ORDER — MIDAZOLAM HCL 5 MG/5ML IJ SOLN
INTRAMUSCULAR | Status: DC | PRN
  Administered 2014-10-31: 2 mg via INTRAVENOUS

## 2014-10-31 MED ORDER — FENTANYL CITRATE (PF) 100 MCG/2ML IJ SOLN: 25.0000 ug | INTRAMUSCULAR | Status: DC | PRN

## 2014-10-31 MED ORDER — HYDROCODONE-ACETAMINOPHEN 5-325 MG PO TABS
1.0000 | ORAL_TABLET | ORAL | Status: DC | PRN
  Administered 2014-10-31 – 2014-11-03 (×3): 1 via ORAL
  Filled 2014-10-31 (×4): qty 1

## 2014-10-31 MED ORDER — PROPOFOL 10 MG/ML IV BOLUS
INTRAVENOUS | Status: DC | PRN
  Administered 2014-10-31: 200 mg via INTRAVENOUS

## 2014-10-31 MED ORDER — LIDOCAINE HCL (PF) 1 % IJ SOLN
INTRAMUSCULAR | Status: AC
  Filled 2014-10-31: qty 30

## 2014-10-31 MED ORDER — SODIUM CHLORIDE 0.9 % IJ SOLN: 3.0000 mL | INTRAMUSCULAR | Status: DC | PRN

## 2014-10-31 MED ORDER — POTASSIUM CHLORIDE CRYS ER 20 MEQ PO TBCR
20.0000 meq | EXTENDED_RELEASE_TABLET | Freq: Two times a day (BID) | ORAL | Status: DC
  Administered 2014-10-31 – 2014-11-03 (×7): 20 meq via ORAL
  Filled 2014-10-31 (×6): qty 1

## 2014-10-31 MED ORDER — SODIUM CHLORIDE 0.9 % IV SOLN
250.0000 mL | INTRAVENOUS | Status: DC | PRN
  Administered 2014-10-31: 250 mL via INTRAVENOUS

## 2014-10-31 MED ORDER — MORPHINE SULFATE (PF) 2 MG/ML IV SOLN: 2.0000 mg | INTRAVENOUS | Status: DC | PRN

## 2014-10-31 MED ORDER — ONDANSETRON HCL 4 MG/2ML IJ SOLN: 4.0000 mg | Freq: Once | INTRAMUSCULAR | Status: DC | PRN

## 2014-10-31 SURGICAL SUPPLY — 49 items
BANDAGE ELASTIC 4 CLIP ST LF (GAUZE/BANDAGES/DRESSINGS) ×3 IMPLANT
BLADE MED AGGRESSIVE (BLADE) ×3 IMPLANT
BLADE OSC/SAGITTAL MD 5.5X18 (BLADE) ×3 IMPLANT
BLADE SURG 15 STRL LF DISP TIS (BLADE) ×2 IMPLANT
BLADE SURG 15 STRL SS (BLADE) ×4
BLADE SURG MINI STRL (BLADE) ×3 IMPLANT
BNDG COHESIVE 4X5 WHT NS (GAUZE/BANDAGES/DRESSINGS) ×3 IMPLANT
BNDG ESMARK 4X12 TAN STRL LF (GAUZE/BANDAGES/DRESSINGS) ×3 IMPLANT
BNDG GAUZE 4.5X4.1 6PLY STRL (MISCELLANEOUS) ×3 IMPLANT
CANISTER SUCT 1200ML W/VALVE (MISCELLANEOUS) IMPLANT
CANISTER SUCT 3000ML (MISCELLANEOUS) ×6 IMPLANT
CLOSURE WOUND 1/2 X4 (GAUZE/BANDAGES/DRESSINGS) ×1
CLOSURE WOUND 1/4X4 (GAUZE/BANDAGES/DRESSINGS)
CUFF TOURN 18 STER (MISCELLANEOUS) IMPLANT
CUFF TOURN DUAL PL 12 NO SLV (MISCELLANEOUS) ×3 IMPLANT
DRAPE FLUOR MINI C-ARM 54X84 (DRAPES) IMPLANT
DURAPREP 26ML APPLICATOR (WOUND CARE) ×3 IMPLANT
GAUZE FLUFF 18X24 1PLY STRL (GAUZE/BANDAGES/DRESSINGS) IMPLANT
GAUZE PETRO XEROFOAM 1X8 (MISCELLANEOUS) ×3 IMPLANT
GAUZE SPONGE 4X4 12PLY STRL (GAUZE/BANDAGES/DRESSINGS) ×3 IMPLANT
GAUZE STRETCH 2X75IN STRL (MISCELLANEOUS) IMPLANT
GAUZE XEROFORM 4X4 STRL (GAUZE/BANDAGES/DRESSINGS) ×3 IMPLANT
GLOVE BIO SURGEON STRL SZ7.5 (GLOVE) ×9 IMPLANT
GLOVE INDICATOR 8.0 STRL GRN (GLOVE) ×3 IMPLANT
GOWN STRL REUS W/ TWL LRG LVL3 (GOWN DISPOSABLE) ×2 IMPLANT
GOWN STRL REUS W/TWL LRG LVL3 (GOWN DISPOSABLE) ×4
HANDPIECE VERSAJET DEBRIDEMENT (MISCELLANEOUS) ×3 IMPLANT
KIT RM TURNOVER STRD PROC AR (KITS) ×3 IMPLANT
LABEL OR SOLS (LABEL) ×3 IMPLANT
NEEDLE FILTER BLUNT 18X 1/2SAF (NEEDLE) ×2
NEEDLE FILTER BLUNT 18X1 1/2 (NEEDLE) ×1 IMPLANT
NEEDLE HYPO 25X1 1.5 SAFETY (NEEDLE) ×6 IMPLANT
NS IRRIG 500ML POUR BTL (IV SOLUTION) ×3 IMPLANT
PACK EXTREMITY ARMC (MISCELLANEOUS) ×3 IMPLANT
PAD ABD DERMACEA PRESS 5X9 (GAUZE/BANDAGES/DRESSINGS) ×6 IMPLANT
PAD GROUND ADULT SPLIT (MISCELLANEOUS) ×3 IMPLANT
PENCIL ELECTRO HAND CTR (MISCELLANEOUS) IMPLANT
SOL .9 NS 3000ML IRR  AL (IV SOLUTION) ×2
SOL .9 NS 3000ML IRR UROMATIC (IV SOLUTION) ×1 IMPLANT
SOL PREP PVP 2OZ (MISCELLANEOUS) ×3
SOLUTION PREP PVP 2OZ (MISCELLANEOUS) ×1 IMPLANT
STOCKINETTE STRL 6IN 960660 (GAUZE/BANDAGES/DRESSINGS) ×3 IMPLANT
STRIP CLOSURE SKIN 1/2X4 (GAUZE/BANDAGES/DRESSINGS) ×2 IMPLANT
STRIP CLOSURE SKIN 1/4X4 (GAUZE/BANDAGES/DRESSINGS) IMPLANT
SUT ETHILON 4-0 (SUTURE) ×4
SUT ETHILON 4-0 FS2 18XMFL BLK (SUTURE) ×2
SUT ETHILON 5-0 FS-2 18 BLK (SUTURE) ×3 IMPLANT
SUTURE ETHLN 4-0 FS2 18XMF BLK (SUTURE) ×2 IMPLANT
SYRINGE 10CC LL (SYRINGE) ×3 IMPLANT

## 2014-10-31 NOTE — Anesthesia Preprocedure Evaluation (Signed)
Anesthesia Evaluation  Patient identified by MRN, date of birth, ID band Patient awake    Reviewed: Allergy & Precautions, NPO status , Patient's Chart, lab work & pertinent test results  History of Anesthesia Complications Negative for: history of anesthetic complications  Airway Mallampati: II       Dental  (+) Edentulous Upper, Partial Lower, Poor Dentition   Pulmonary Current Smoker,     + decreased breath sounds      Cardiovascular negative cardio ROS Normal cardiovascular exam     Neuro/Psych    GI/Hepatic negative GI ROS, Neg liver ROS,   Endo/Other  diabetes, Poorly Controlled  Renal/GU negative Renal ROS  negative genitourinary   Musculoskeletal negative musculoskeletal ROS (+)   Abdominal Normal abdominal exam  (+)   Peds negative pediatric ROS (+)  Hematology negative hematology ROS (+)   Anesthesia Other Findings   Reproductive/Obstetrics                             Anesthesia Physical Anesthesia Plan  ASA: II  Anesthesia Plan: MAC   Post-op Pain Management:    Induction: Intravenous  Airway Management Planned: Nasal Cannula  Additional Equipment:   Intra-op Plan:   Post-operative Plan:   Informed Consent: I have reviewed the patients History and Physical, chart, labs and discussed the procedure including the risks, benefits and alternatives for the proposed anesthesia with the patient or authorized representative who has indicated his/her understanding and acceptance.     Plan Discussed with: CRNA  Anesthesia Plan Comments:         Anesthesia Quick Evaluation

## 2014-10-31 NOTE — Progress Notes (Signed)
Right great toe

## 2014-10-31 NOTE — Interval H&P Note (Signed)
History and Physical Interval Note:  10/31/2014 12:05 PM  [name]  has presented today for surgery, with the diagnosis of Gangrene  The various methods of treatment have been discussed with the patient and family. After consideration of risks, benefits and other options for treatment, the patient has consented to  Procedure(s): AMPUTATION TOE (Right) as a surgical intervention .  The patient's history has been reviewed, patient examined, no change in status, stable for surgery.  I have reviewed the patient's chart and labs.  Questions were answered to the patient's satisfaction.     Tresea Heine W.

## 2014-10-31 NOTE — Op Note (Signed)
Date of operation: 10/31/2014.  Surgeon: Durward Fortes DPM.  Preoperative diagnosis: Gangrene with osteomyelitis right great toe.  Postoperative diagnosis: Same.  Procedure: Amputation right great toe.  Anesthesia: Gen. endotracheal.  Hemostasis: None.  Estimated blood loss: Approximately 5 cc.  Pathology: Right great toe.  Cultures: Bone culture right great toe.  Complications: None, other than minimal bleeding throughout the procedure.  Operative indications: This is a 54 year old male with recent development of a blister that progressed to gangrene on his right great toe. Presented to the emergency department where he was admitted for IV antibiotics and surgical management.  Operative procedure: The patient was taken to the operating room and placed on the table in the supine position. Following satisfactory general anesthesia the left foot was prepped and draped in the usual sterile fashion. Attention was then directed to the distal aspect of the right foot where an obviously gangrenous great toe was present. An elliptical incision was made from dorsal to plantar around the base of the toe. Devitalized tissue was still present along the lateral border of the incision. The incision was carried sharply down to bone and periosteal dissection carried back to the level of the metatarsophalangeal joint where the toe was disarticulated in toto. There was still noted to be some necrotic tissue along the lateral aspect and this was excisionally debrided with a versa jet debrider on a setting of 6. The wound was then thoroughly flushed with the versa jet on a setting of 1-2. At this point there was noted to be relatively granular-looking tissues with no residual necrosis. Minimal bleeding was noted throughout the procedure. The wound was then closed with 40 vertical mattress and simple interrupted sutures. Xeroform 4 x 4's and ABDs and Kerlix were then applied followed by an Ace wrap. The patient  tolerated the procedure and anesthesia well and was transported to the PACU with vital signs stable and in good condition.

## 2014-10-31 NOTE — Progress Notes (Signed)
[place] [facility] [place] NAME: [name]    MR#:  [id number]  DATE OF BIRTH:  1960/02/22  SUBJECTIVE:  CHIEF COMPLAINT:   Chief Complaint  Patient presents with  . Leg Swelling   patient here due to swelling of the right leg and foot and noted to have a right great toe osteomyelitis. Going to the OR later today. Right great toe amputation.  No complaints.  REVIEW OF SYSTEMS:    Review of Systems  Constitutional: Negative for fever and chills.  HENT: Negative for congestion and tinnitus.   Eyes: Negative for blurred vision and double vision.  Respiratory: Negative for cough, shortness of breath and wheezing.   Cardiovascular: Negative for chest pain, orthopnea and PND.  Gastrointestinal: Negative for nausea, vomiting, abdominal pain and diarrhea.  Genitourinary: Negative for dysuria and hematuria.  Neurological: Negative for dizziness, sensory change and focal weakness.  All other systems reviewed and are negative.   Nutrition: Carb control Tolerating Diet: Yes Tolerating PT: Await evaluation   DRUG ALLERGIES:  No Known Allergies  VITALS:  Blood pressure 122/69, pulse 68, temperature 98 F (36.7 C), temperature source Oral, resp. rate 18, height 5\' 6"  (1.676 m), weight 74.844 kg (165 lb), SpO2 100 %.  PHYSICAL EXAMINATION:   Physical Exam  GENERAL:  54 y.o.-year-old patient lying in the bed with no acute distress.  EYES: Pupils equal, round, reactive to light and accommodation. No scleral icterus. Extraocular muscles intact.  HEENT: Head atraumatic, normocephalic. Oropharynx and nasopharynx clear.  NECK:  Supple, no jugular venous distention. No thyroid enlargement, no tenderness.  LUNGS: Normal breath sounds bilaterally, no wheezing, rales, rhonchi. No use of accessory muscles of respiration.  CARDIOVASCULAR: S1, S2 normal. No murmurs, rubs, or gallops.  ABDOMEN: Soft, nontender, nondistended. Bowel sounds present.  No organomegaly or mass.  EXTREMITIES: No cyanosis, clubbing b/l.  Right foot dressing in place. +1 right ankle/foot edema.  NEUROLOGIC: Cranial nerves II through XII are intact. No focal Motor or sensory deficits b/l.   PSYCHIATRIC: The patient is alert and oriented x 3. Good affect SKIN: No obvious rash, lesion, or ulcer.    LABORATORY PANEL:   CBC  Recent Labs Lab 10/31/14 0312  WBC 9.3  HGB 10.9*  HCT 33.1*  PLT 204   ------------------------------------------------------------------------------------------------------------------  Chemistries   Recent Labs Lab 10/31/14 0312  NA 138  K 3.3*  CL 108  CO2 24  GLUCOSE 71  BUN 13  CREATININE 0.72  CALCIUM 8.3*   ------------------------------------------------------------------------------------------------------------------  Cardiac Enzymes No results for input(s): TROPONINI in the last 168 hours. ------------------------------------------------------------------------------------------------------------------  RADIOLOGY:  US Venous Img Lower Unilateral Right  10/30/2014   CLINICAL DATA:  Right lower extremity pain and swelling for 2 weeks. Evaluate for DVT.  EXAM: RIGHT LOWER EXTREMITY VENOUS DOPPLER ULTRASOUND  TECHNIQUE: Gray-scale sonography with graded compression, as well as color Doppler and duplex ultrasound were performed to evaluate the lower extremity deep venous systems from the level of the common femoral vein and including the common femoral, femoral, profunda femoral, popliteal and calf veins including the posterior tibial, peroneal and gastrocnemius veins when visible. The superficial great saphenous vein was also interrogated. Spectral Doppler was utilized to evaluate flow at rest and with distal augmentation maneuvers in the common femoral, femoral and popliteal veins.  COMPARISON:  Right foot radiographs - 10/30/2014  FINDINGS: Contralateral Common Femoral Vein: Respiratory phasicity is normal and  symmetric with the symptomatic side. No evidence of thrombus.  Normal compressibility.  Common Femoral Vein: No evidence of thrombus. Normal compressibility, respiratory phasicity and response to augmentation.  Saphenofemoral Junction: No evidence of thrombus. Normal compressibility and flow on color Doppler imaging.  Profunda Femoral Vein: No evidence of thrombus. Normal compressibility and flow on color Doppler imaging.  Femoral Vein: No evidence of thrombus. Normal compressibility, respiratory phasicity and response to augmentation.  Popliteal Vein: No evidence of thrombus. Normal compressibility, respiratory phasicity and response to augmentation.  Calf Veins: No evidence of thrombus. Normal compressibility and flow on color Doppler imaging.  Superficial Great Saphenous Vein: No evidence of thrombus. Normal compressibility and flow on color Doppler imaging.  Venous Reflux:  None.  Other Findings: Note is made of a benign appearing, non pathologically enlarged likely reactive right inguinal lymph node (image 41).  IMPRESSION: No evidence of DVT within right lower extremity.   Electronically Signed   By: Sandi Mariscal M.D.   On: 10/30/2014 12:13   Dg Foot Complete Right  10/30/2014   CLINICAL DATA:  Foot swelling for 2 weeks. Redness. Ulcerations. Gangrenous appearance.  EXAM: RIGHT FOOT COMPLETE - 3+ VIEW  COMPARISON:  None.  FINDINGS: New diffuse soft tissue swelling. Mild amount of subcutaneous air about the great toe cannot be excluded. This to be consistent with clinical diagnosis of a a gangrenous foot. Infection within the soft tissues of the right great toe should be considered . Subtle erosion of the distal right first metatarsal noted. This could represent a focal area of osteomyelitis. Joint space involvement is most likely present.  IMPRESSION: 1. Diffuse soft tissue swelling. Soft tissue air is noted about the right great toe suggesting a gangrenous toe with soft tissue infection as clinically  suggested. 2. Focal lucency noted the distal right first metatarsal consistent with osteomyelitis. Involvement of the joint space is most likely present. Gadolinium-enhanced MRI of the right foot can be obtained for further evaluation as needed. These results will be called to the ordering clinician or representative by the Radiologist Assistant, and communication documented in the PACS or zVision Dashboard.   Electronically Signed   By: Marcello Moores  Register   On: 10/30/2014 11:14     ASSESSMENT AND PLAN:   54 year old male with past medical history of diabetes who presented to the hospital due to right foot swelling and noted to have a right great toe osteomyelitis.  #1 right foot osteomyelitis-patient has an Oxymizer the right great toe. This is likely the cause of patient's right foot swelling and pain and redness. -Continue IV vancomycin, Zosyn. Follow cultures. -Afebrile and hemodynamically stable.   -Appreciate podiatry input and patient going to the OR later today for right great toe amputation.  #2 diabetes type 2-uncontrolled. Continue sliding scale insulin for now.  #3 hypokalemia-we'll place on oral potassium supplements.   All the records are reviewed and case discussed with Care Management/Social Workerr. Management plans discussed with the patient, family and they are in agreement.  CODE STATUS: Full  DVT Prophylaxis: Lovenox   TOTAL TIME TAKING CARE OF THIS PATIENT: 25 minutes.   POSSIBLE D/C IN 1-2 DAYS, DEPENDING ON CLINICAL CONDITION.   Henreitta Leber M.D on 10/31/2014 at 2:00 PM  Between 7am to 6pm - Pager - [phone]  After 6pm go to [email or web address] - password [email or web address] [facility]  [place]  Office  [phone]  CC: Primary care physician; No PCP Per Patient

## 2014-10-31 NOTE — Progress Notes (Signed)
Dr.Sainani here to see patient, informed of patient's K+= 3.3

## 2014-10-31 NOTE — Progress Notes (Signed)
Call received from [name], RN- report given, consents on chart. Informed of patient's blood sugar of 83.  Will send 1400 pm zosyn in chart- if available from Pharmacy.

## 2014-10-31 NOTE — Anesthesia Procedure Notes (Signed)
Procedure Name: LMA Insertion Date/Time: 10/31/2014 2:04 PM Performed by: Delaney Meigs Pre-anesthesia Checklist: Patient identified, Emergency Drugs available, Suction available, Patient being monitored and Timeout performed Patient Re-evaluated:Patient Re-evaluated prior to inductionOxygen Delivery Method: Circle system utilized and Simple face mask Preoxygenation: Pre-oxygenation with 100% oxygen Intubation Type: IV induction Ventilation: Mask ventilation without difficulty LMA Size: 4.5 Number of attempts: 1 Placement Confirmation: breath sounds checked- equal and bilateral and positive ETCO2 Tube secured with: Tape

## 2014-10-31 NOTE — Plan of Care (Signed)
Problem: Consults Goal: Diabetes Guidelines if Diabetic/Glucose > 140 If diabetic or lab glucose is > 140 mg/dl - Initiate Diabetes/Hyperglycemia Guidelines & Document Interventions  Outcome: Completed/Met Date Met:  10/31/14 fsbs 95

## 2014-10-31 NOTE — Transfer of Care (Signed)
Immediate Anesthesia Transfer of Care Note  Patient: [name]  Procedure(s) Performed: Procedure(s): AMPUTATION TOE (Right)  Patient Location: PACU  Anesthesia Type:General  Level of Consciousness: awake, alert  and oriented  Airway & Oxygen Therapy: Patient Spontanous Breathing and Patient connected to face mask oxygen  Post-op Assessment: Report given to RN and Post -op Vital signs reviewed and stable  Post vital signs: Reviewed and stable  Last Vitals: 83 hr 99% sat 28resp 100.2 temp 122/69 Filed Vitals:   10/31/14 0737  BP: 122/69  Pulse: 68  Temp: 36.7 C  Resp: 18    Complications: No apparent anesthesia complications

## 2014-10-31 NOTE — Progress Notes (Signed)
Orderly here to take patient to surgery.

## 2014-10-31 NOTE — Progress Notes (Signed)
Dr Verdell Carmine notified of glucose 73 orders received

## 2014-10-31 NOTE — H&P (View-Only) (Signed)
Reason for Consult: Gangrene with osteomyelitis right great toe Referring Physician: [facility]  Angelina Luhmann is an 54 y.o. male.  HPI: The patient relates that this started a couple of weeks ago when he injured the right great toe. He noticed a blister on the bottom of the toe that just recently within the last few days started to turn black and discolored. Started to notice some odor from the area. Presented to the emergency department where x-rays confirmed gas gangrene in the soft tissues with some erosive changes in the bone of the great toe.  History reviewed. No pertinent past medical history.  History reviewed. No pertinent past surgical history.  History reviewed. No pertinent family history.  Social History:  reports that he has been smoking.  He does not have any smokeless tobacco history on file. He reports that he does not drink alcohol or use illicit drugs.  Allergies: No Known Allergies  Medications: I have reviewed the patient's current medications.  Results for orders placed or performed during the hospital encounter of 10/30/14 (from the past 48 hour(s))  CBC with Differential/Platelet     Status: Abnormal   Collection Time: 10/30/14 11:19 AM  Result Value Ref Range   WBC 11.3 (H) 3.8 - 10.6 K/uL   RBC 4.31 (L) 4.40 - 5.90 MIL/uL   Hemoglobin 12.6 (L) 13.0 - 18.0 g/dL   HCT 38.1 (L) 40.0 - 52.0 %   MCV 88.4 80.0 - 100.0 fL   MCH 29.2 26.0 - 34.0 pg   MCHC 33.1 32.0 - 36.0 g/dL   RDW 12.8 11.5 - 14.5 %   Platelets 217 150 - 440 K/uL   Neutrophils Relative % 81 %   Neutro Abs 9.2 (H) 1.4 - 6.5 K/uL   Lymphocytes Relative 11 %   Lymphs Abs 1.3 1.0 - 3.6 K/uL   Monocytes Relative 6 %   Monocytes Absolute 0.6 0.2 - 1.0 K/uL   Eosinophils Relative 1 %   Eosinophils Absolute 0.1 0 - 0.7 K/uL   Basophils Relative 1 %   Basophils Absolute 0.1 0 - 0.1 K/uL  Basic metabolic panel     Status: Abnormal   Collection Time: 10/30/14 11:19 AM  Result  Value Ref Range   Sodium 135 135 - 145 mmol/L   Potassium 3.5 3.5 - 5.1 mmol/L   Chloride 100 (L) 101 - 111 mmol/L   CO2 27 22 - 32 mmol/L   Glucose, Bld 114 (H) 65 - 99 mg/dL   BUN 13 6 - 20 mg/dL   Creatinine, Ser 0.74 0.61 - 1.24 mg/dL   Calcium 9.0 8.9 - 10.3 mg/dL   GFR calc non Af Amer >60 >60 mL/min   GFR calc Af Amer >60 >60 mL/min    Comment: (NOTE) The eGFR has been calculated using the CKD EPI equation. This calculation has not been validated in all clinical situations. eGFR's persistently <60 mL/min signify possible Chronic Kidney Disease.    Anion gap 8 5 - 15  Lactic acid, plasma     Status: None   Collection Time: 10/30/14 11:19 AM  Result Value Ref Range   Lactic Acid, Venous 1.2 0.5 - 2.0 mmol/L  Glucose, capillary     Status: Abnormal   Collection Time: 10/30/14 12:09 PM  Result Value Ref Range   Glucose-Capillary 101 (H) 65 - 99 mg/dL  Glucose, capillary     Status: Abnormal   Collection Time: 10/30/14  4:23 PM  Result Value Ref Range     Glucose-Capillary 148 (H) 65 - 99 mg/dL   Comment 1 Notify RN     Us Venous Img Lower Unilateral Right  10/30/2014   CLINICAL DATA:  Right lower extremity pain and swelling for 2 weeks. Evaluate for DVT.  EXAM: RIGHT LOWER EXTREMITY VENOUS DOPPLER ULTRASOUND  TECHNIQUE: Gray-scale sonography with graded compression, as well as color Doppler and duplex ultrasound were performed to evaluate the lower extremity deep venous systems from the level of the common femoral vein and including the common femoral, femoral, profunda femoral, popliteal and calf veins including the posterior tibial, peroneal and gastrocnemius veins when visible. The superficial great saphenous vein was also interrogated. Spectral Doppler was utilized to evaluate flow at rest and with distal augmentation maneuvers in the common femoral, femoral and popliteal veins.  COMPARISON:  Right foot radiographs - 10/30/2014  FINDINGS: Contralateral Common Femoral Vein:  Respiratory phasicity is normal and symmetric with the symptomatic side. No evidence of thrombus. Normal compressibility.  Common Femoral Vein: No evidence of thrombus. Normal compressibility, respiratory phasicity and response to augmentation.  Saphenofemoral Junction: No evidence of thrombus. Normal compressibility and flow on color Doppler imaging.  Profunda Femoral Vein: No evidence of thrombus. Normal compressibility and flow on color Doppler imaging.  Femoral Vein: No evidence of thrombus. Normal compressibility, respiratory phasicity and response to augmentation.  Popliteal Vein: No evidence of thrombus. Normal compressibility, respiratory phasicity and response to augmentation.  Calf Veins: No evidence of thrombus. Normal compressibility and flow on color Doppler imaging.  Superficial Great Saphenous Vein: No evidence of thrombus. Normal compressibility and flow on color Doppler imaging.  Venous Reflux:  None.  Other Findings: Note is made of a benign appearing, non pathologically enlarged likely reactive right inguinal lymph node (image 41).  IMPRESSION: No evidence of DVT within right lower extremity.   Electronically Signed   By: John  Watts M.D.   On: 10/30/2014 12:13   Dg Foot Complete Right  10/30/2014   CLINICAL DATA:  Foot swelling for 2 weeks. Redness. Ulcerations. Gangrenous appearance.  EXAM: RIGHT FOOT COMPLETE - 3+ VIEW  COMPARISON:  None.  FINDINGS: New diffuse soft tissue swelling. Mild amount of subcutaneous air about the great toe cannot be excluded. This to be consistent with clinical diagnosis of a a gangrenous foot. Infection within the soft tissues of the right great toe should be considered . Subtle erosion of the distal right first metatarsal noted. This could represent a focal area of osteomyelitis. Joint space involvement is most likely present.  IMPRESSION: 1. Diffuse soft tissue swelling. Soft tissue air is noted about the right great toe suggesting a gangrenous toe with soft  tissue infection as clinically suggested. 2. Focal lucency noted the distal right first metatarsal consistent with osteomyelitis. Involvement of the joint space is most likely present. Gadolinium-enhanced MRI of the right foot can be obtained for further evaluation as needed. These results will be called to the ordering clinician or representative by the Radiologist Assistant, and communication documented in the PACS or zVision Dashboard.   Electronically Signed   By: Thomas  Register   On: 10/30/2014 11:14    Review of Systems  Constitutional: Negative.   HENT: Negative.   Eyes: Negative.   Cardiovascular: Negative.   Gastrointestinal: Negative.   Genitourinary: Negative.   Musculoskeletal: Negative.   Skin:       Drainage from the ulceration on the bottom of his right great toe. Has noticed some malodor with this as well. Redness and discoloration in the   right foot  Neurological:       Relates some occasional paresthesias and tingling in the feet.  Endo/Heme/Allergies: Negative.   Psychiatric/Behavioral: Negative.    Blood pressure 136/68, pulse 82, temperature 98.5 F (36.9 C), temperature source Oral, resp. rate 18, height 5' 6" (1.676 m), weight 74.844 kg (165 lb), SpO2 100 %. Physical Exam  Cardiovascular:  DP and PT pulses are barely palpable bilateral. Capillary filling time absent to the right hallux  Musculoskeletal:  Adequate range of motion of the pedal joints. Muscle testing is normal.  Neurological:  Protective threshold monofilament wire appears to be intact to the digits and forefoot.  Skin:  The skin is warm dry and somewhat atrophic with some diminished hair growth distally in the forefoot and digits. Cyanosis is present in the distal right hallux with a full-thickness malodorous ulceration with gangrenous tissue along the plantar aspect of the hallux IPJ. Some discoloration is noted in the distal forefoot with some mild erythema. Some edema is noted generalized in the  right foot and lower leg as compared to the left.    Assessment/Plan: Assessment: 1. Gangrene with osteomyelitis right great toe. 2. Diabetes mellitus poorly controlled. 3. Evidence for peripheral vascular disease. Plan: Discussed with the patient the need for amputation of his right great toe due to the gangrenous changes. We also discussed that with his diabetes under poor control as well as his long history of smoking that he is at a much higher risk for vascular disease and nonhealing following the surgery. We will obtain a vascular consult but plan to proceed with the amputation for tomorrow. Consent form for amputation of the right great toe. Nothing by mouth after midnight.  Floreine Kingdon W. 10/30/2014, 5:35 PM      

## 2014-10-31 NOTE — Progress Notes (Signed)
Patient npo for surgery today on right great toe MRSA swab negative for resistance but positive for staph. Antibiotics given iv as ordered. No c/o pain. Surgery today at 1200 pm with Dr. Sharlotte Alamo. Patient to sign  Consents  this am

## 2014-11-01 LAB — GLUCOSE, CAPILLARY
GLUCOSE-CAPILLARY: 104 mg/dL — AB (ref 65–99)
Glucose-Capillary: 74 mg/dL (ref 65–99)
Glucose-Capillary: 119 mg/dL — ABNORMAL HIGH (ref 65–99)
Glucose-Capillary: 139 mg/dL — ABNORMAL HIGH (ref 65–99)

## 2014-11-01 LAB — HEMOGLOBIN A1C: Hgb A1c MFr Bld: 7.7 % — ABNORMAL HIGH (ref 4.0–6.0)

## 2014-11-01 LAB — POTASSIUM: Potassium: 3.7 mmol/L (ref 3.5–5.1)

## 2014-11-01 LAB — VANCOMYCIN, TROUGH: VANCOMYCIN TR: 9 ug/mL — AB (ref 10–20)

## 2014-11-01 NOTE — Progress Notes (Signed)
[place] [facility] [place] NAME: [name]    MR#:  [id number]  DATE OF BIRTH:  07/28/1960  SUBJECTIVE:  CHIEF COMPLAINT:   Chief Complaint  Patient presents with  . Leg Swelling   patient here due to swelling of the right leg and foot and noted to have a right great toe osteomyelitis. Status post right great toe amputation postop day #1.  No complaints presently  REVIEW OF SYSTEMS:    Review of Systems  Constitutional: Negative for fever and chills.  HENT: Negative for congestion and tinnitus.   Eyes: Negative for blurred vision and double vision.  Respiratory: Negative for cough, shortness of breath and wheezing.   Cardiovascular: Negative for chest pain, orthopnea and PND.  Gastrointestinal: Negative for nausea, vomiting, abdominal pain and diarrhea.  Genitourinary: Negative for dysuria and hematuria.  Neurological: Negative for dizziness, sensory change and focal weakness.  All other systems reviewed and are negative.   Nutrition: Carb control Tolerating Diet: Yes Tolerating PT: Await evaluation   DRUG ALLERGIES:  No Known Allergies  VITALS:  Blood pressure 136/76, pulse 69, temperature 98.6 F (37 C), temperature source Oral, resp. rate 18, height 5\' 6"  (1.676 m), weight 74.844 kg (165 lb), SpO2 100 %.  PHYSICAL EXAMINATION:   Physical Exam  GENERAL:  54 y.o.-year-old patient lying in the bed with no acute distress.  EYES: Pupils equal, round, reactive to light and accommodation. No scleral icterus. Extraocular muscles intact.  HEENT: Head atraumatic, normocephalic. Oropharynx and nasopharynx clear.  NECK:  Supple, no jugular venous distention. No thyroid enlargement, no tenderness.  LUNGS: Normal breath sounds bilaterally, no wheezing, rales, rhonchi. No use of accessory muscles of respiration.  CARDIOVASCULAR: S1, S2 normal. No murmurs, rubs, or gallops.  ABDOMEN: Soft, nontender, nondistended. Bowel sounds  present. No organomegaly or mass.  EXTREMITIES: No cyanosis, clubbing b/l.  Right foot dressing in place. +1 right ankle/foot edema.  NEUROLOGIC: Cranial nerves II through XII are intact. No focal Motor or sensory deficits b/l.   PSYCHIATRIC: The patient is alert and oriented x 3. Good affect SKIN: No obvious rash, lesion, or ulcer.    LABORATORY PANEL:   CBC  Recent Labs Lab 10/31/14 0312  WBC 9.3  HGB 10.9*  HCT 33.1*  PLT 204   ------------------------------------------------------------------------------------------------------------------  Chemistries   Recent Labs Lab 10/31/14 0312  NA 138  K 3.3*  CL 108  CO2 24  GLUCOSE 71  BUN 13  CREATININE 0.72  CALCIUM 8.3*   ------------------------------------------------------------------------------------------------------------------  Cardiac Enzymes No results for input(s): TROPONINI in the last 168 hours. ------------------------------------------------------------------------------------------------------------------  RADIOLOGY:  No results found.   ASSESSMENT AND PLAN:   54 year old male with past medical history of diabetes who presented to the hospital due to right foot swelling and noted to have a right great toe osteomyelitis.  #1 right foot osteomyelitis-patient has an Osteomyelitis of the right great toe. This is likely the cause of patient's right foot swelling and pain and redness. -Status post right great toe amputation. Postop day #1. -Continue IV vancomycin, Zosyn. Follow cultures. -Afebrile and hemodynamically stable.   -Patient likely to go for angiogram tomorrow as per vascular surgery.  #2 diabetes type 2-Continue sliding scale insulin for now. -Noncompliant and was not taking oral meds. Will check hemoglobin A1c. Blood sugars here have been stable.   #3 hypokalemia-improving w/ supplementation.    All the records are reviewed and case discussed with Care Management/Social  Workerr. Management plans  discussed with the patient, family and they are in agreement.  CODE STATUS: Full  DVT Prophylaxis: Lovenox   TOTAL TIME TAKING CARE OF THIS PATIENT: 25 minutes.   POSSIBLE D/C IN 1-2 DAYS, DEPENDING ON CLINICAL CONDITION.   Henreitta Leber M.D on 11/01/2014 at 12:03 PM  Between 7am to 6pm - Pager - [phone]  After 6pm go to [email or web address] - password [email or web address] [facility]  [place] [facility]  Office  [phone]  CC: Primary care physician; No PCP Per Patient

## 2014-11-01 NOTE — Progress Notes (Signed)
1 Day Post-Op  Subjective: Patient seen. Only mild occasional pain in his right foot. Approximately 3-4.  Objective: Vital signs in last 24 hours: Temp:  [98.3 F (36.8 C)-101 F (38.3 C)] 98.6 F (37 C) (10/09 0740) Pulse Rate:  [67-83] 69 (10/09 0740) Resp:  [15-35] 18 (10/09 0740) BP: (109-136)/(65-76) 136/76 mmHg (10/09 0740) SpO2:  [97 %-100 %] 100 % (10/09 0740) Last BM Date: 10/31/14  Intake/Output from previous day: 10/08 0701 - 10/09 0700 In: 1105 [P.O.:240; I.V.:865] Out: 1400 [Urine:1400] Intake/Output this shift: Total I/O In: 120 [P.O.:120] Out: 1450 [Urine:1450]  The bandages dry and intact with no evidence of any bleeding through the bandage. Some dusky appearance to the distal tips of the second and third toes.  Lab Results:   Recent Labs  10/30/14 1119 10/31/14 0312  WBC 11.3* 9.3  HGB 12.6* 10.9*  HCT 38.1* 33.1*  PLT 217 204   BMET  Recent Labs  10/30/14 1119 10/31/14 0312 11/01/14 0924  NA 135 138  --   K 3.5 3.3* 3.7  CL 100* 108  --   CO2 27 24  --   GLUCOSE 114* 71  --   BUN 13 13  --   CREATININE 0.74 0.72  --   CALCIUM 9.0 8.3*  --    PT/INR No results for input(s): LABPROT, INR in the last 72 hours. ABG No results for input(s): PHART, HCO3 in the last 72 hours.  Invalid input(s): PCO2, PO2  Studies/Results: No results found.  Anti-infectives: Anti-infectives    Start     Dose/Rate Route Frequency Ordered Stop   10/31/14 0000  vancomycin (VANCOCIN) IVPB 1000 mg/200 mL premix  Status:  Discontinued     1,000 mg 200 mL/hr over 60 Minutes Intravenous Every 24 hours 10/30/14 1325 10/30/14 1420   10/30/14 2200  piperacillin-tazobactam (ZOSYN) IVPB 3.375 g     3.375 g 12.5 mL/hr over 240 Minutes Intravenous 3 times per day 10/30/14 1952     10/30/14 2200  vancomycin (VANCOCIN) 1,250 mg in sodium chloride 0.9 % 250 mL IVPB     1,250 mg 166.7 mL/hr over 90 Minutes Intravenous Every 12 hours 10/30/14 1952     10/30/14 1500   vancomycin (VANCOCIN) 1,250 mg in sodium chloride 0.9 % 250 mL IVPB     1,250 mg 166.7 mL/hr over 90 Minutes Intravenous  Once 10/30/14 1420 10/30/14 1748   10/30/14 1415  piperacillin-tazobactam (ZOSYN) IVPB 3.375 g     3.375 g 100 mL/hr over 30 Minutes Intravenous  Once 10/30/14 1413 10/30/14 1524   10/30/14 1330  piperacillin-tazobactam (ZOSYN) IVPB 3.375 g  Status:  Discontinued     3.375 g 12.5 mL/hr over 240 Minutes Intravenous 4 times per day 10/30/14 1325 10/30/14 1413      Assessment/Plan: s/p Procedure(s): AMPUTATION TOE (Right) Assessment: Gangrene with osteomyelitis status post amputation   Plan: Dressing was left intact. Scheduled for angiogram tomorrow with intervention. We will reassess following his angiogram for additional tissue loss and/or further debridements or amputations.  LOS: 2 days    Jalaiyah Throgmorton W. 11/01/2014

## 2014-11-01 NOTE — Progress Notes (Signed)
Right great toe

## 2014-11-01 NOTE — Care Management Important Message (Signed)
Important Message  Patient Details  Name: Tilian Celedon MRN: [id number] Date of Birth: [birth year]   Medicare Important Message Given:  Yes-second notification given    Chellsea Beckers A, RN 11/01/2014, 8:54 AM

## 2014-11-01 NOTE — Care Management Note (Signed)
Case Management Note  Patient Details  Name: [name] MRN: [id number] Date of Birth: 07-28-1960  Subjective/Objective:      54yo [name] was admitted 10/30/14 per gangrene and osteomylitis of his right great toe. Toe was amputated on 10/31/14 by Dr Cleda Mccreedy. [name] is employed full time at [employer] in [place]. He states that he has never had [name] physician PCP. Pharmacy[facility] [place] equipment consists of [name] shower chair. No home oxygen. Drives own car. Anticipate need for either [name] walker or crutches at discharge. Case management will follow for discharge planning.               Action/Plan:   Expected Discharge Date:  11/03/14               Expected Discharge Plan:     In-House Referral:     Discharge planning Services     Post Acute Care Choice:    Choice offered to:     DME Arranged:    DME Agency:     HH Arranged:    [place] Agency:     Status of Service:     Medicare Important Message Given:  Yes-second notification given Date Medicare IM Given:    Medicare IM give by:    Date Additional Medicare IM Given:    Additional Medicare Important Message give by:     If discussed at [place] of Stay Meetings, dates discussed:    Additional Comments:  Gregory Willhoite A, RN 11/01/2014, 11:06 AM

## 2014-11-01 NOTE — Anesthesia Postprocedure Evaluation (Signed)
  Anesthesia Post-op Note  Patient: [name]  Procedure(s) Performed: Procedure(s): AMPUTATION TOE (Right)  Anesthesia type:MAC  Patient location: PACU  Post pain: Pain level controlled  Post assessment: Post-op Vital signs reviewed, Patient's Cardiovascular Status Stable, Respiratory Function Stable, Patent Airway and No signs of Nausea or vomiting  Post vital signs: Reviewed and stable  Last Vitals:  Filed Vitals:   11/01/14 0003  BP: 123/73  Pulse: 73  Temp: 37.4 C  Resp: 18    Level of consciousness: awake, alert  and patient cooperative  Complications: No apparent anesthesia complications

## 2014-11-01 NOTE — Progress Notes (Signed)
ANTIBIOTIC CONSULT NOTE - INITIAL  Pharmacy Consult for Zosyn/Vancomycin Indication: Diabetic Foot Infection/Possible Osteomyelitis  No Known Allergies  Patient Measurements: Height: 5\' 6"  (167.6 cm) Weight: 165 lb (74.844 kg) IBW/kg (Calculated) : 63.8 Adjusted Body Weight:    Vital Signs: Temp: 98.6 F (37 C) (10/09 0740) Temp Source: Oral (10/09 0740) BP: 136/76 mmHg (10/09 0740) Pulse Rate: 69 (10/09 0740)  Labs:  Recent Labs  10/30/14 1119 10/31/14 0312  WBC 11.3* 9.3  HGB 12.6* 10.9*  PLT 217 204  CREATININE 0.74 0.72   Estimated Creatinine Clearance: 97.5 mL/min (by C-G formula based on Cr of 0.72).   Microbiology: Recent Results (from the past 720 hour(s))  Blood culture (routine x 2)     Status: None (Preliminary result)   Collection Time: 10/30/14 11:19 AM  Result Value Ref Range Status   Specimen Description BLOOD LEFT AC  Final   Special Requests   Final    BOTTLES DRAWN AEROBIC AND ANAEROBIC  AER 14CC ANA 10CC   Culture NO GROWTH 2 DAYS  Final   Report Status PENDING  Incomplete  Blood culture (routine x 2)     Status: None (Preliminary result)   Collection Time: 10/30/14 11:52 AM  Result Value Ref Range Status   Specimen Description BLOOD RIGHT HAND  Final   Special Requests BOTTLES DRAWN AEROBIC AND ANAEROBIC  1CC  Final   Culture NO GROWTH 2 DAYS  Final   Report Status PENDING  Incomplete  Surgical pcr screen     Status: Abnormal   Collection Time: 10/30/14 10:30 PM  Result Value Ref Range Status   MRSA, PCR NEGATIVE NEGATIVE Final   Staphylococcus aureus POSITIVE (A) NEGATIVE Final    Comment: CALLED IVIE FRYAR AT 0129 ON 10/31/14 [facility] READ BACK   Wound culture     Status: None (Preliminary result)   Collection Time: 10/31/14  2:47 PM  Result Value Ref Range Status   Specimen Description WOUND  Final   Special Requests NONE  Final   Gram Stain PENDING  Incomplete   Culture NO GROWTH < 24 HOURS  Final   Report Status PENDING  Incomplete     Medical History: Past Medical History  Diagnosis Date  . Presence of permanent cardiac pacemaker     Medications:  Scheduled:  . enoxaparin (LOVENOX) injection  40 mg Subcutaneous Q24H  . insulin aspart  0-5 Units Subcutaneous QHS  . insulin aspart  0-9 Units Subcutaneous TID WC  . piperacillin-tazobactam (ZOSYN)  IV  3.375 g Intravenous 3 times per day  . potassium chloride  20 mEq Oral BID  . sodium chloride  3 mL Intravenous Q12H  . vancomycin  1,250 mg Intravenous Q12H   Anti-infectives    Start     Dose/Rate Route Frequency Ordered Stop   10/31/14 0000  vancomycin (VANCOCIN) IVPB 1000 mg/200 mL premix  Status:  Discontinued     1,000 mg 200 mL/hr over 60 Minutes Intravenous Every 24 hours 10/30/14 1325 10/30/14 1420   10/30/14 2200  piperacillin-tazobactam (ZOSYN) IVPB 3.375 g     3.375 g 12.5 mL/hr over 240 Minutes Intravenous 3 times per day 10/30/14 1952     10/30/14 2200  vancomycin (VANCOCIN) 1,250 mg in sodium chloride 0.9 % 250 mL IVPB     1,250 mg 166.7 mL/hr over 90 Minutes Intravenous Every 12 hours 10/30/14 1952     10/30/14 1500  vancomycin (VANCOCIN) 1,250 mg in sodium chloride 0.9 % 250 mL IVPB  1,250 mg 166.7 mL/hr over 90 Minutes Intravenous  Once 10/30/14 1420 10/30/14 1748   10/30/14 1415  piperacillin-tazobactam (ZOSYN) IVPB 3.375 g     3.375 g 100 mL/hr over 30 Minutes Intravenous  Once 10/30/14 1413 10/30/14 1524   10/30/14 1330  piperacillin-tazobactam (ZOSYN) IVPB 3.375 g  Status:  Discontinued     3.375 g 12.5 mL/hr over 240 Minutes Intravenous 4 times per day 10/30/14 1325 10/30/14 1413     Assessment: 54 yo male with gangrenous right great toe, possible Osteomyelitis. Hx DM. Possible amputation?  Goal of Therapy:  Vancomycin trough level 15-20 mcg/ml  Plan:  Measure antibiotic drug levels at steady state Follow up culture results Will continue Zosyn 3.375gm IV q8h.  Vancomycin dose missed last night so trough, although low, is  not meaningful. Will continue vancomycin 1250 mg iv q 12 hours and check another trough in 4 doses.   Ulice Dash, PharmD Clinical Pharmacist 11/01/2014 10:45 AM

## 2014-11-01 NOTE — Progress Notes (Deleted)
PT here to see patient this am for morning therapy.

## 2014-11-02 ENCOUNTER — Encounter: Payer: Self-pay | Admitting: Podiatry

## 2014-11-02 ENCOUNTER — Encounter
Admission: EM | Disposition: A | Discharge status: Home / Self Care | Payer: Self-pay | Source: Home / Self Care | Attending: Specialist

## 2014-11-02 HISTORY — PX: PERIPHERAL VASCULAR CATHETERIZATION: SHX172C

## 2014-11-02 LAB — VANCOMYCIN, TROUGH: Vancomycin Tr: 12 ug/mL (ref 10–20)

## 2014-11-02 LAB — GLUCOSE, CAPILLARY
GLUCOSE-CAPILLARY: 158 mg/dL — AB (ref 65–99)
GLUCOSE-CAPILLARY: 76 mg/dL (ref 65–99)
Glucose-Capillary: 124 mg/dL — ABNORMAL HIGH (ref 65–99)
Glucose-Capillary: 128 mg/dL — ABNORMAL HIGH (ref 65–99)
Glucose-Capillary: 237 mg/dL — ABNORMAL HIGH (ref 65–99)

## 2014-11-02 LAB — CREATININE, SERUM: Creatinine, Ser: 0.81 mg/dL (ref 0.61–1.24)

## 2014-11-02 SURGERY — ABDOMINAL AORTOGRAM W/LOWER EXTREMITY
Laterality: Right | Wound class: Clean

## 2014-11-02 MED ORDER — FENTANYL CITRATE (PF) 100 MCG/2ML IJ SOLN
INTRAMUSCULAR | Status: AC
  Filled 2014-11-02: qty 2

## 2014-11-02 MED ORDER — LIDOCAINE-EPINEPHRINE (PF) 1 %-1:200000 IJ SOLN
INTRAMUSCULAR | Status: DC | PRN
  Administered 2014-11-02: 10 mL via INTRADERMAL

## 2014-11-02 MED ORDER — HEPARIN SODIUM (PORCINE) 1000 UNIT/ML IJ SOLN
INTRAMUSCULAR | Status: AC
  Filled 2014-11-02: qty 1

## 2014-11-02 MED ORDER — IOHEXOL 300 MG/ML  SOLN
INTRAMUSCULAR | Status: DC | PRN
  Administered 2014-11-02: 70 mL via INTRA_ARTERIAL

## 2014-11-02 MED ORDER — CEFAZOLIN SODIUM 1-5 GM-% IV SOLN
INTRAVENOUS | Status: AC
Start: 2014-11-02 — End: 2014-11-02
  Filled 2014-11-02: qty 50

## 2014-11-02 MED ORDER — ENOXAPARIN SODIUM 60 MG/0.6ML ~~LOC~~ SOLN
40.0000 mg | Freq: Every day | SUBCUTANEOUS | Status: DC
  Administered 2014-11-02: 40 mg via SUBCUTANEOUS
  Filled 2014-11-02 (×2): qty 0.6

## 2014-11-02 MED ORDER — MIDAZOLAM HCL 5 MG/5ML IJ SOLN
INTRAMUSCULAR | Status: AC
Start: 2014-11-02 — End: 2014-11-02
  Filled 2014-11-02: qty 5

## 2014-11-02 MED ORDER — FENTANYL CITRATE (PF) 100 MCG/2ML IJ SOLN
INTRAMUSCULAR | Status: DC | PRN
  Administered 2014-11-02 (×2): 50 ug via INTRAVENOUS

## 2014-11-02 MED ORDER — HEPARIN SODIUM (PORCINE) 1000 UNIT/ML IJ SOLN
INTRAMUSCULAR | Status: DC | PRN
  Administered 2014-11-02: 4000 [IU] via INTRAVENOUS

## 2014-11-02 MED ORDER — MIDAZOLAM HCL 2 MG/2ML IJ SOLN
INTRAMUSCULAR | Status: DC | PRN
  Administered 2014-11-02: 2 mg via INTRAVENOUS
  Administered 2014-11-02: 1 mg via INTRAVENOUS

## 2014-11-02 SURGICAL SUPPLY — 19 items
BALLN LUTONIX 4X150X130 (BALLOONS) ×5
BALLN LUTONIX 5X150X130 (BALLOONS) ×5
BALLN ULTRVRSE 3X220X150 (BALLOONS) ×5
BALLOON LUTONIX 4X150X130 (BALLOONS) ×3 IMPLANT
BALLOON LUTONIX 5X150X130 (BALLOONS) ×3 IMPLANT
BALLOON ULTRVRSE 3X220X150 (BALLOONS) ×3 IMPLANT
CATH CXI SUPP ST 4FR 135CM (MICROCATHETER) ×5 IMPLANT
CATH ROYAL FLUSH PIG 5F 70CM (CATHETERS) ×5 IMPLANT
CATH VERT 100CM (CATHETERS) ×5 IMPLANT
DEVICE PRESTO INFLATION (MISCELLANEOUS) ×5 IMPLANT
DEVICE STARCLOSE SE CLOSURE (Vascular Products) ×5 IMPLANT
GLIDEWIRE ADV .035X260CM (WIRE) ×5 IMPLANT
PACK ANGIOGRAPHY (CUSTOM PROCEDURE TRAY) ×5 IMPLANT
SHEATH ANL2 6FRX45 HC (SHEATH) ×5 IMPLANT
SHEATH BRITE TIP 5FRX11 (SHEATH) ×5 IMPLANT
SYR MEDRAD MARK V 150ML (SYRINGE) ×5 IMPLANT
TUBING CONTRAST HIGH PRESS 72 (TUBING) ×5 IMPLANT
WIRE G V18X300CM (WIRE) ×5 IMPLANT
WIRE J 3MM .035X145CM (WIRE) ×5 IMPLANT

## 2014-11-02 NOTE — Op Note (Signed)
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 Ultrasound guidance for vascular access left femoral artery 2. Catheter placement into right posterior tibial artery from left femoral approach 3. Aortogram and selective right lower extremity angiogram 4. Percutaneous transluminal angioplasty of right posterior tibial artery and tibioperoneal trunk with 3 mm diameter conventional and 4 mm diameter drug-coated angioplasty balloon 5. Percutaneous transluminal angioplasty of right popliteal artery with 5 mm diameter by 15 cm length Lutonix drug-coated angioplasty balloon  6.  StarClose closure device left femoral artery  EBL: 25 cc  Contrast: 70 cc  Fluoro Time: 5 minutes  Indications: Patient is a 54 year old male with a gangrenous right great toe and poor blood flow at surgery for amputation of the toe. The patient is brought in for angiography for further evaluation and potential treatment. Risks and benefits are discussed and informed consent is obtained  Procedure: The patient was identified and appropriate procedural time out was performed. The patient was then placed supine on the table and prepped and draped in the usual sterile fashion. Ultrasound was used to evaluate the left common femoral artery. It was patent . A digital ultrasound image was acquired. A Seldinger needle was used to access the left common femoral artery under direct ultrasound guidance and a permanent image was performed. A 0.035 J wire was advanced without resistance and a 5Fr sheath was placed. Pigtail catheter was placed into the aorta and an AP aortogram was performed. This demonstrated  normal renal arteries and normal aorta and iliac segments without significant stenosis. I then crossed the aortic bifurcation and advanced to the right femoral head. Selective right lower extremity angiogram was then performed. This demonstrated normal common femoral artery and profunda femoris artery, the SFA demonstrated multiple areas of less than 50% stenosis, the popliteal artery had 2 areas of 60-70% stenosis in close proximity. There was then occlusion of the tibioperoneal trunk and the proximal peroneal and posterior tibial arteries. The posterior tibial artery was the dominant runoff to the foot after it reconstituted in its proximal to mid segment. The patient was systemically heparinized and a 6 [place] Ansell sheath was then placed over the [employer] wire. I then used a Kumpe catheter and the advantage wire to navigate through the popliteal stenosis and then into the tibioperoneal trunk occlusion. I then exchanged for a 135 cm CXI catheter and cross the tibioperoneal trunk and proximal posterior tibial artery occlusion confirmed intraluminal flow in the mid posterior tibial artery. I then exchanged for a 0.018 wire to perform intervention. A 3 mm diameter by 22 cm length balloon was used to treat the posterior tibial artery and tibioperoneal trunk. This was inflated to 8 atm for 1 minute. I then pre-treated the popliteal artery prior to using a larger Lutonix drug-coated angioplasty balloon in the popliteal artery for definitive treatment. A 5 mm diameter by 15 cm length Lutonix drug-coated angioplasty balloon was inflated in the popliteal artery to 14 atm for 1 minute with less than 20% residual stenosis after angioplasty. The tibioperoneal trunk and proximal posterior tibial artery however had greater than 60% residual stenosis. I then upsized to a 4 mm diameter by 15 cm length Lutonix drug-coated angioplasty balloon in the tibioperoneal trunk and proximal posterior tibial artery. This was  inflated to 8 atm for 1 minute. The balloon was  then deflated and completion angiogram showed markedly improved flow with only 10-15% residual stenosis in the tibioperoneal trunk and proximal posterior tibial artery. The posterior tibial artery was then continuous to the foot. I elected to terminate the procedure. The sheath was removed and StarClose closure device was deployed in the left femoral artery with excellent hemostatic result. The patient was taken to the recovery room in stable condition having tolerated the procedure well.  Findings:  Aortogram: normal aorta and common and external iliac arteries, left hypogastric artery occlusion, normal renal arteries Right Lower Extremity: popliteal stenosis, occlusion of TP trunk and proximal PT artery, PT artery was dominant runoff   Disposition: Patient was taken to the recovery room in stable condition having tolerated the procedure well.  Complications: None  DEW,JASON 11/02/2014 2:10 PM

## 2014-11-02 NOTE — Progress Notes (Addendum)
[name] at [place] NAME: [name]    MR#:  [id number]  DATE OF BIRTH:  03-09-1960  SUBJECTIVE:   patient here due to swelling of the right leg and foot and noted to have a right great toe osteomyelitis. Status post right great toe amputation postop day #2.  No complaints presently Seen in specials recovery post angiogram  REVIEW OF SYSTEMS:    Review of Systems  Constitutional: Negative for fever and chills.  HENT: Negative for congestion and tinnitus.   Eyes: Negative for blurred vision and double vision.  Respiratory: Negative for cough, shortness of breath and wheezing.   Cardiovascular: Negative for chest pain, orthopnea and PND.  Gastrointestinal: Negative for nausea, vomiting, abdominal pain and diarrhea.  Genitourinary: Negative for dysuria and hematuria.  Neurological: Negative for dizziness, sensory change and focal weakness.  All other systems reviewed and are negative.   Nutrition: Carb control Tolerating Diet: Yes Tolerating PT: Await evaluation   DRUG ALLERGIES:  No Known Allergies  VITALS:  Blood pressure 132/76, pulse 69, temperature 98.6 F (37 C), temperature source Oral, resp. rate 16, height 5\' 6"  (1.676 m), weight 74.844 kg (165 lb), SpO2 100 %.  PHYSICAL EXAMINATION:   Physical Exam  GENERAL:  54 y.o.-year-old patient lying in the bed with no acute distress.  EYES: Pupils equal, round, reactive to light and accommodation. No scleral icterus. Extraocular muscles intact.  HEENT: Head atraumatic, normocephalic. Oropharynx and nasopharynx clear.  NECK:  Supple, no jugular venous distention. No thyroid enlargement, no tenderness.  LUNGS: Normal breath sounds bilaterally, no wheezing, rales, rhonchi. No use of accessory muscles of respiration.  CARDIOVASCULAR: S1, S2 normal. No murmurs, rubs, or gallops.  ABDOMEN: Soft, nontender, nondistended. Bowel sounds present. No organomegaly or mass.   EXTREMITIES: No cyanosis, clubbing b/l.  Right foot dressing in place. +1 right ankle/foot edema.  NEUROLOGIC: Cranial nerves II through XII are intact. No focal Motor or sensory deficits b/l.   PSYCHIATRIC: The patient is alert and oriented x 3. Good affect SKIN: No obvious rash, lesion, or ulcer.    LABORATORY PANEL:   CBC  Recent Labs Lab 10/31/14 0312  WBC 9.3  HGB 10.9*  HCT 33.1*  PLT 204   ------------------------------------------------------------------------------------------------------------------  Chemistries   Recent Labs Lab 10/31/14 0312 11/01/14 0924  NA 138  --   K 3.3* 3.7  CL 108  --   CO2 24  --   GLUCOSE 71  --   BUN 13  --   CREATININE 0.72  --   CALCIUM 8.3*  --    ASSESSMENT AND PLAN:   54 year old male with past medical history of diabetes who presented to the hospital due to right foot swelling and noted to have a right great toe osteomyelitis.  #1 right foot osteomyelitis-patient has an Osteomyelitis of the right great toe. This is likely the cause of patient's right foot swelling and pain and redness. -Status post right great toe amputation. Postop day #2. -Continue IV vancomycin, Zosyn. Follow cultures. -Afebrile and hemodynamically stable.   -s/p angiogram oct 10th as per vascular surgery with angioplasty  #2 diabetes type 2-Continue sliding scale insulin for now. -Noncompliant and was not taking oral meds. Will check hemoglobin A1c. Blood sugars here have been stable.   #3 hypokalemia-improving w/ supplementation.    All the records are reviewed and case discussed with Care Management/Social Workerr. Management plans discussed with the patient, family and they are in agreement.  CODE STATUS: Full  DVT Prophylaxis: Lovenox   TOTAL TIME TAKING CARE OF THIS PATIENT: 25 minutes.   POSSIBLE D/C IN 1-2 DAYS, DEPENDING ON CLINICAL CONDITION.   [name] M.D on 11/02/2014 at 5:39 PM  Between 7am to 6pm - Pager -  [phone]  After 6pm go to [email or web address] - password [email or web address] [facility]  [facility]  Office  [phone]  CC: Primary care physician; No PCP Per Patient

## 2014-11-02 NOTE — Progress Notes (Signed)
2 Days Post-Op  Subjective: Patient seen. Doing well. No real complaints.  Objective: Vital signs in last 24 hours: Temp:  [98.1 F (36.7 C)-99.5 F (37.5 C)] 98.8 F (37.1 C) (10/10 0726) Pulse Rate:  [63-76] 64 (10/10 0726) Resp:  [14-18] 14 (10/10 0726) BP: (115-139)/(69-81) 133/78 mmHg (10/10 0726) SpO2:  [99 %-100 %] 100 % (10/10 0726) Last BM Date: 10/31/14  Intake/Output from previous day: 10/09 0701 - 10/10 0700 In: 540 [P.O.:240; IV Piggyback:300] Out: 2850 [Urine:2850] Intake/Output this shift:    Minimal bleeding on the bandage. No significant sign of purulence. The incision is well coapted with some mild dehiscence along the base of the second toe area. Still some discoloration in the forefoot but no significant increase in the dusky appearance to the second and third toes. Improvement in the edema in the foot and leg.  Lab Results:   Recent Labs  10/30/14 1119 10/31/14 0312  WBC 11.3* 9.3  HGB 12.6* 10.9*  HCT 38.1* 33.1*  PLT 217 204   BMET  Recent Labs  10/30/14 1119 10/31/14 0312 11/01/14 0924  NA 135 138  --   K 3.5 3.3* 3.7  CL 100* 108  --   CO2 27 24  --   GLUCOSE 114* 71  --   BUN 13 13  --   CREATININE 0.74 0.72  --   CALCIUM 9.0 8.3*  --    PT/INR No results for input(s): LABPROT, INR in the last 72 hours. ABG No results for input(s): PHART, HCO3 in the last 72 hours.  Invalid input(s): PCO2, PO2  Studies/Results: No results found.  Anti-infectives: Anti-infectives    Start     Dose/Rate Route Frequency Ordered Stop   10/31/14 0000  vancomycin (VANCOCIN) IVPB 1000 mg/200 mL premix  Status:  Discontinued     1,000 mg 200 mL/hr over 60 Minutes Intravenous Every 24 hours 10/30/14 1325 10/30/14 1420   10/30/14 2200  piperacillin-tazobactam (ZOSYN) IVPB 3.375 g     3.375 g 12.5 mL/hr over 240 Minutes Intravenous 3 times per day 10/30/14 1952     10/30/14 2200  vancomycin (VANCOCIN) 1,250 mg in sodium chloride 0.9 % 250 mL IVPB      1,250 mg 166.7 mL/hr over 90 Minutes Intravenous Every 12 hours 10/30/14 1952     10/30/14 1500  vancomycin (VANCOCIN) 1,250 mg in sodium chloride 0.9 % 250 mL IVPB     1,250 mg 166.7 mL/hr over 90 Minutes Intravenous  Once 10/30/14 1420 10/30/14 1748   10/30/14 1415  piperacillin-tazobactam (ZOSYN) IVPB 3.375 g     3.375 g 100 mL/hr over 30 Minutes Intravenous  Once 10/30/14 1413 10/30/14 1524   10/30/14 1330  piperacillin-tazobactam (ZOSYN) IVPB 3.375 g  Status:  Discontinued     3.375 g 12.5 mL/hr over 240 Minutes Intravenous 4 times per day 10/30/14 1325 10/30/14 1413      Assessment/Plan: s/p Procedure(s): AMPUTATION TOE (Right) Assessment: Gangrene with osteomyelitis status post amputation   Plan: Betadine and a sterile bandage reapplied to the right foot. Patient is scheduled for his angiogram later on today. We will continue to monitor patient post procedure for healing or demarcation.  LOS: 3 days    Gregory Creson W. 11/02/2014

## 2014-11-02 NOTE — Progress Notes (Signed)
ACCORDING TO VASCULAR ,PT WILL HAVE ARTERIOGRAM AT 4PM. RN SPOKE WITH  DR Lucky Cowboy. MD ORDERS CLEAR LIQUID BREAKFAST THEN NPO

## 2014-11-03 ENCOUNTER — Encounter: Payer: Self-pay | Admitting: Vascular Surgery

## 2014-11-03 LAB — LIPID PANEL
CHOL/HDL RATIO: 6.9 ratio
Cholesterol: 118 mg/dL (ref 0–200)
HDL: 17 mg/dL — AB (ref >40)
LDL CALC: 80 mg/dL (ref 0–99)
TRIGLYCERIDES: 103 mg/dL (ref <150)
VLDL: 21 mg/dL (ref 0–40)

## 2014-11-03 LAB — GLUCOSE, CAPILLARY
Glucose-Capillary: 118 mg/dL — ABNORMAL HIGH (ref 65–99)
Glucose-Capillary: 168 mg/dL — ABNORMAL HIGH (ref 65–99)

## 2014-11-03 LAB — WOUND CULTURE: CULTURE: NORMAL

## 2014-11-03 MED ORDER — AMOXICILLIN-POT CLAVULANATE 875-125 MG PO TABS: 1.0000 | ORAL_TABLET | Freq: Two times a day (BID) | ORAL | Status: DC

## 2014-11-03 MED ORDER — VANCOMYCIN HCL 10 G IV SOLR
1500.0000 mg | Freq: Two times a day (BID) | INTRAVENOUS | Status: DC
  Administered 2014-11-03: 1500 mg via INTRAVENOUS
  Filled 2014-11-03 (×2): qty 1500

## 2014-11-03 MED ORDER — METFORMIN HCL 500 MG PO TABS: 500.0000 mg | ORAL_TABLET | Freq: Two times a day (BID) | ORAL | Status: DC

## 2014-11-03 NOTE — Progress Notes (Signed)
ANTIBIOTIC CONSULT NOTE - INITIAL  Pharmacy Consult for Zosyn/Vancomycin Indication: Diabetic Foot Infection/Possible Osteomyelitis  No Known Allergies  Patient Measurements: Height: 5\' 6"  (167.6 cm) Weight: 165 lb (74.844 kg) IBW/kg (Calculated) : 63.8 Adjusted Body Weight:    Vital Signs: Temp: 99.2 F (37.3 C) (10/10 1934) Temp Source: Oral (10/10 1934) BP: 127/78 mmHg (10/10 1934) Pulse Rate: 81 (10/10 1934)  Labs:  Recent Labs  10/31/14 0312 11/02/14 2156  WBC 9.3  --   HGB 10.9*  --   PLT 204  --   CREATININE 0.72 0.81   Estimated Creatinine Clearance: 96.3 mL/min (by C-G formula based on Cr of 0.81).   Microbiology: Recent Results (from the past 720 hour(s))  Blood culture (routine x 2)     Status: None (Preliminary result)   Collection Time: 10/30/14 11:19 AM  Result Value Ref Range Status   Specimen Description BLOOD LEFT AC  Final   Special Requests   Final    BOTTLES DRAWN AEROBIC AND ANAEROBIC  AER 14CC ANA 10CC   Culture NO GROWTH 3 DAYS  Final   Report Status PENDING  Incomplete  Blood culture (routine x 2)     Status: None (Preliminary result)   Collection Time: 10/30/14 11:52 AM  Result Value Ref Range Status   Specimen Description BLOOD RIGHT HAND  Final   Special Requests BOTTLES DRAWN AEROBIC AND ANAEROBIC  1CC  Final   Culture NO GROWTH 3 DAYS  Final   Report Status PENDING  Incomplete  Surgical pcr screen     Status: Abnormal   Collection Time: 10/30/14 10:30 PM  Result Value Ref Range Status   MRSA, PCR NEGATIVE NEGATIVE Final   Staphylococcus aureus POSITIVE (A) NEGATIVE Final    Comment: CALLED IVIE FRYAR AT 0129 ON 10/31/14 RWW READ BACK   Anaerobic culture     Status: None (Preliminary result)   Collection Time: 10/31/14  2:47 PM  Result Value Ref Range Status   Specimen Description WOUND  Final   Special Requests NONE  Final   Gram Stain   Final    FEW WBC SEEN RARE GRAM POSITIVE COCCI RARE GRAM NEGATIVE RODS    Culture  HOLDING FOR POSSIBLE PATHOGEN  Final   Report Status PENDING  Incomplete  Wound culture     Status: None (Preliminary result)   Collection Time: 10/31/14  2:47 PM  Result Value Ref Range Status   Specimen Description WOUND  Final   Special Requests NONE  Final   Gram Stain PENDING  Incomplete   Culture NO GROWTH 2 DAYS  Final   Report Status PENDING  Incomplete    Medical History: Past Medical History  Diagnosis Date  . Presence of permanent cardiac pacemaker   . Diabetes mellitus without complication (HCC)     Medications:  Scheduled:  . enoxaparin (LOVENOX) injection  40 mg Subcutaneous QHS  . insulin aspart  0-5 Units Subcutaneous QHS  . insulin aspart  0-9 Units Subcutaneous TID WC  . piperacillin-tazobactam (ZOSYN)  IV  3.375 g Intravenous 3 times per day  . potassium chloride  20 mEq Oral BID  . sodium chloride  3 mL Intravenous Q12H  . vancomycin  1,500 mg Intravenous Q12H   Anti-infectives    Start     Dose/Rate Route Frequency Ordered Stop   11/03/14 1000  vancomycin (VANCOCIN) 1,500 mg in sodium chloride 0.9 % 500 mL IVPB     1,500 mg 250 mL/hr over 120 Minutes Intravenous  Every 12 hours 11/03/14 0051     10/31/14 0000  vancomycin (VANCOCIN) IVPB 1000 mg/200 mL premix  Status:  Discontinued     1,000 mg 200 mL/hr over 60 Minutes Intravenous Every 24 hours 10/30/14 1325 10/30/14 1420   10/30/14 2200  piperacillin-tazobactam (ZOSYN) IVPB 3.375 g     3.375 g 12.5 mL/hr over 240 Minutes Intravenous 3 times per day 10/30/14 1952     10/30/14 2200  vancomycin (VANCOCIN) 1,250 mg in sodium chloride 0.9 % 250 mL IVPB  Status:  Discontinued     1,250 mg 166.7 mL/hr over 90 Minutes Intravenous Every 12 hours 10/30/14 1952 11/03/14 0050   10/30/14 1500  vancomycin (VANCOCIN) 1,250 mg in sodium chloride 0.9 % 250 mL IVPB     1,250 mg 166.7 mL/hr over 90 Minutes Intravenous  Once 10/30/14 1420 10/30/14 1748   10/30/14 1415  piperacillin-tazobactam (ZOSYN) IVPB 3.375 g      3.375 g 100 mL/hr over 30 Minutes Intravenous  Once 10/30/14 1413 10/30/14 1524   10/30/14 1330  piperacillin-tazobactam (ZOSYN) IVPB 3.375 g  Status:  Discontinued     3.375 g 12.5 mL/hr over 240 Minutes Intravenous 4 times per day 10/30/14 1325 10/30/14 1413     Assessment: 54 yo male with gangrenous right great toe, possible Osteomyelitis. Hx DM. Possible amputation?  Goal of Therapy:  Vancomycin trough level 15-20 mcg/ml  Plan:  Measure antibiotic drug levels at steady state Follow up culture results Will continue Zosyn 3.375gm IV q8h.  Vancomycin dose missed last night so trough, although low, is not meaningful. Will continue vancomycin 1250 mg iv q 12 hours and check another trough in 4 doses.   1010 2156 vancomycin trough subtherapeutic. Increased to 1.5 gm IV Q12H (Ke 0.096 hr, predicted trough 15 mcg/mL), want to keep on Q12H dosing if patient needs prolonged course. Will check another level on 1013.  [name] A. Jordan Hawks, PharmD Clinical Pharmacist 11/03/2014 12:51 AM

## 2014-11-03 NOTE — Discharge Summary (Signed)
[facility] [place] NAME: Abdishakur Ryman    MR#:  [id number]  DATE OF BIRTH:  [birth year]  DATE OF ADMISSION:  10/30/2014 ADMITTING PHYSICIAN: Epifanio Lesches, MD  DATE OF DISCHARGE: 11/03/2014  3:04 PM  PRIMARY CARE PHYSICIAN: No PCP Per Patient    ADMISSION DIAGNOSIS:  Hyperglycemia [R73.9] Toe gangrene ([place]) [I96]  DISCHARGE DIAGNOSIS:  Active Problems:   Toe gangrene ([place])   SECONDARY DIAGNOSIS:   Past Medical History  Diagnosis Date  . Presence of permanent cardiac pacemaker   . Diabetes mellitus without complication [facility])     HOSPITAL COURSE:   54 year old male with past medical history of diabetes who presented to the hospital due to right foot swelling and noted to have a right great toe osteomyelitis.  #1 right foot osteomyelitis-this was likely the cause of patient's right foot swelling pain and redness. Patient was admitted to the hospital started on broad-spectrum IV antibiotics with vancomycin and Zosyn. Podiatry consult was obtained. Patient underwent right great toe amputation. -Postoperatively patient has done well. He denies any worsening pain. He was also seen by vascular surgery and underwent angiogram and revascularization to his right lower extremity. -Patient is clinically afebrile and hemodynamically stable. He is being discharged home on oral Augmentin for 2 weeks with close follow-up with podiatry next week. -Patient was advised to bear weight on his heel and was discharged on a specialized boot.    #2 diabetes type 2-patient was noncompliant with his medications. His hemoglobin A1c was noted to be 7.7. -He is being discharged on oral metformin and will follow up with his primary care physician.  #3 hypokalemia-resolved with supplementation   DISCHARGE CONDITIONS:   Stable  CONSULTS OBTAINED:  Treatment Team:  Sharlotte Alamo, MD Algernon Huxley, MD  DRUG ALLERGIES:  No Known  Allergies  DISCHARGE MEDICATIONS:   Discharge Medication List as of 11/03/2014  2:46 PM    START taking these medications   Details  amoxicillin-clavulanate (AUGMENTIN) 875-125 MG tablet Take 1 tablet by mouth 2 (two) times daily., Starting 11/03/2014, Until Discontinued, Print    metFORMIN (GLUCOPHAGE) 500 MG tablet Take 1 tablet (500 mg total) by mouth 2 (two) times daily with a meal., Starting 11/03/2014, Until Discontinued, Print      CONTINUE these medications which have NOT CHANGED   Details  naproxen sodium (ANAPROX) 220 MG tablet Take 220 mg by mouth daily as needed (pain)., Until Discontinued, Historical Med         DISCHARGE INSTRUCTIONS:   DIET:  Diabetic diet  DISCHARGE CONDITION:  Stable  ACTIVITY:  Activity as tolerated  OXYGEN:  Home Oxygen: No.   Oxygen Delivery: room air  DISCHARGE LOCATION:  home   If you experience worsening of your admission symptoms, develop shortness of breath, life threatening emergency, suicidal or homicidal thoughts you must seek medical attention immediately by calling 911 or calling your MD immediately  if symptoms less severe.  You Must read complete instructions/literature along with all the possible adverse reactions/side effects for all the Medicines you take and that have been prescribed to you. Take any new Medicines after you have completely understood and accpet all the possible adverse reactions/side effects.   Please note  You were cared for by a hospitalist during your hospital stay. If you have any questions about your discharge medications or the care you received while you were in the hospital after you are discharged, you can call the unit and  asked to speak with the hospitalist on call if the hospitalist that took care of you is not available. Once you are discharged, your primary care physician will handle any further medical issues. Please note that NO REFILLS for any discharge medications will be authorized  once you are discharged, as it is imperative that you return to your primary care physician (or establish a relationship with a primary care physician if you do not have one) for your aftercare needs so that they can reassess your need for medications and monitor your lab values.     Today   Patient denies any nausea vomiting fever. He denies any lower extremity pain.  VITAL SIGNS:  Blood pressure 116/74, pulse 65, temperature 98.4 F (36.9 C), temperature source Oral, resp. rate 16, height 5\' 6"  (1.676 m), weight 74.844 kg (165 lb), SpO2 100 %.  I/O:   Intake/Output Summary (Last 24 hours) at 11/03/14 1611 Last data filed at 11/03/14 1024  Gross per 24 hour  Intake   2040 ml  Output   1900 ml  Net    140 ml    PHYSICAL EXAMINATION:  GENERAL:  54 y.o.-year-old patient lying in the bed with no acute distress.  EYES: Pupils equal, round, reactive to light and accommodation. No scleral icterus. Extraocular muscles intact.  HEENT: Head atraumatic, normocephalic. Oropharynx and nasopharynx clear.  NECK:  Supple, no jugular venous distention. No thyroid enlargement, no tenderness.  LUNGS: Normal breath sounds bilaterally, no wheezing, rales,rhonchi. No use of accessory muscles of respiration.  CARDIOVASCULAR: S1, S2 normal. No murmurs, rubs, or gallops.  ABDOMEN: Soft, non-tender, non-distended. Bowel sounds present. No organomegaly or mass.  EXTREMITIES: No pedal edema, cyanosis, or clubbing. Right lower extremity dressing in place with no drainage.   NEUROLOGIC: Cranial nerves II through XII are intact. No focal motor or sensory defecits b/l.  PSYCHIATRIC: The patient is alert and oriented x 3. Good affect.  SKIN: No obvious rash, lesion, or ulcer.   DATA REVIEW:   CBC  Recent Labs Lab 10/31/14 0312  WBC 9.3  HGB 10.9*  HCT 33.1*  PLT 204    Chemistries   Recent Labs Lab 10/31/14 0312 11/01/14 0924 11/02/14 2156  NA 138  --   --   K 3.3* 3.7  --   CL 108  --   --    CO2 24  --   --   GLUCOSE 71  --   --   BUN 13  --   --   CREATININE 0.72  --  0.81  CALCIUM 8.3*  --   --     Cardiac Enzymes No results for input(s): TROPONINI in the last 168 hours.  Microbiology Results  Results for orders placed or performed during the hospital encounter of 10/30/14  Blood culture (routine x 2)     Status: None (Preliminary result)   Collection Time: 10/30/14 11:19 AM  Result Value Ref Range Status   Specimen Description BLOOD LEFT AC  Final   Special Requests   Final    BOTTLES DRAWN AEROBIC AND ANAEROBIC  AER 14CC ANA 10CC   Culture NO GROWTH 3 DAYS  Final   Report Status PENDING  Incomplete  Blood culture (routine x 2)     Status: None (Preliminary result)   Collection Time: 10/30/14 11:52 AM  Result Value Ref Range Status   Specimen Description BLOOD RIGHT HAND  Final   Special Requests BOTTLES DRAWN AEROBIC AND ANAEROBIC  [place]  Final  Culture NO GROWTH 3 DAYS  Final   Report Status PENDING  Incomplete  Surgical pcr screen     Status: Abnormal   Collection Time: 10/30/14 10:30 PM  Result Value Ref Range Status   MRSA, PCR NEGATIVE NEGATIVE Final   Staphylococcus aureus POSITIVE (A) NEGATIVE Final    Comment: CALLED IVIE FRYAR AT 0129 ON 10/31/14 RWW READ BACK   Anaerobic culture     Status: None (Preliminary result)   Collection Time: 10/31/14  2:47 PM  Result Value Ref Range Status   Specimen Description WOUND  Final   Special Requests NONE  Final   Gram Stain   Final    FEW WBC SEEN RARE GRAM POSITIVE COCCI RARE GRAM NEGATIVE RODS    Culture HOLDING FOR POSSIBLE PATHOGEN  Final   Report Status PENDING  Incomplete  Wound culture     Status: None   Collection Time: 10/31/14  2:47 PM  Result Value Ref Range Status   Specimen Description WOUND  Final   Special Requests NONE  Final   Gram Stain FEW WBC SEEN FEW GRAM POSITIVE COCCI   Final   Culture NORMAL SKIN FLORA  Final   Report Status 11/03/2014 FINAL  Final    RADIOLOGY:  No  results found.    Management plans discussed with the patient, family and they are in agreement.  CODE STATUS:     Code Status Orders        Start     Ordered   10/31/14 1614  Full code   Continuous     10/31/14 1613      TOTAL TIME TAKING CARE OF THIS PATIENT: 40 minutes.    Henreitta Leber M.D on 11/03/2014 at 4:11 PM  Between 7am to 6pm - Pager - [phone]  After 6pm go to [email or web address] - password [email or web address] [facility]  [facility]  Office  [phone]  CC: Primary care physician; No PCP Per Patient

## 2014-11-03 NOTE — Progress Notes (Signed)
Inpatient Diabetes Program Recommendations  AACE/[name]: New Consensus Statement on Inpatient Glycemic Control (2015)  Target Ranges:  Prepandial:   less than 140 mg/dL      Peak postprandial:   less than 180 mg/dL (1-2 hours)      Critically ill patients:  140 - 180 mg/dL  Results for ARMANII, BOELENS (MRN [id number]) as of 11/03/2014 09:31  Ref. Range 11/02/2014 08:27 11/02/2014 11:19 11/02/2014 16:20 11/02/2014 21:05 11/03/2014 07:49  Glucose-Capillary Latest Ref Range: 65-99 mg/dL 128 (H) 158 (H) 76 237 (H) 118 (H)  Results for NICHOL, EMMEL (MRN [id number]) as of 11/03/2014 09:31  Ref. Range 10/31/2014 03:12  Hemoglobin A1C Latest Ref Range: 4.0-6.0 % 7.7 (H)   Review of Glycemic Control  Diabetes history: DM2 Outpatient Diabetes medications: None Current orders for Inpatient glycemic control: Novolog 0-9 TID with meals, Novolog 0-5 units HS  Inpatient Diabetes Program Recommendations: HgbA1C: A1C 7.7% on 10/31/14. Recommend patient be discharged on oral DM medication and establish care with primary MD for follow up.  Thanks, Barnie Alderman, RN, MSN, CCRN, CDE Diabetes Coordinator Inpatient Diabetes Program [phone] (Team Pager from [place] to [place]) [phone] ([facility] office) [phone] [facility] office) [phone] [facility] office)

## 2014-11-03 NOTE — Progress Notes (Signed)
[facility]  Daily Progress Note   Subjective: 1 Day Post-Op: Aortogram and selective right lower extremity angiogram, Percutaneous transluminal angioplasty of right posterior tibial artery and tibioperoneal trunk with 3 mm diameter conventional and 4 mm diameter drug-coated angioplasty balloon, Percutaneous transluminal angioplasty of right popliteal artery with 5 mm diameter by 15 cm length Lutonix drug-coated angioplasty balloon  Patient without complaint this AM.   Objective: Filed Vitals:   11/02/14 1805 11/02/14 1934 11/03/14 0405 11/03/14 0747  BP: 129/75 127/78 128/75 116/74  Pulse: 73 81 74 65  Temp: 98.5 F (36.9 C) 99.2 F (37.3 C) 100.4 F (38 C) 98.4 F (36.9 C)  TempSrc: Oral Oral Oral Oral  Resp: 14 18 18 16   Height:      Weight:      SpO2: 100% 99% 99% 100%    Intake/Output Summary (Last 24 hours) at 11/03/14 0913 Last data filed at 11/03/14 0800  Gross per 24 hour  Intake   1180 ml  Output   1900 ml  Net   -720 ml    Physical Exam: A&Ox3, NAD CV: RRR Pulmonary: CTA Bilaterally Abdomen: Soft, Nontender, Nondistended Vascular:  Right: Podiatry dressing on right foot - clean and dry. Extremity  warm, minimal edema. Popliteal pulse palpable.   Laboratory: CBC    Component Value Date/Time   WBC 9.3 10/31/2014 0312   WBC 6.3 12/29/2013 1214   HGB 10.9* 10/31/2014 0312   HGB 14.8 12/29/2013 1214   HCT 33.1* 10/31/2014 0312   HCT 45.2 12/29/2013 1214   PLT 204 10/31/2014 0312   PLT 143* 12/29/2013 1214    BMET    Component Value Date/Time   NA 138 10/31/2014 0312   NA 135* 12/29/2013 1214   K 3.7 11/01/2014 0924   K 3.8 12/29/2013 1214   CL 108 10/31/2014 0312   CL 99 12/29/2013 1214   CO2 24 10/31/2014 0312   CO2 28 12/29/2013 1214   GLUCOSE 71 10/31/2014 0312   GLUCOSE 234* 12/29/2013 1214   BUN 13 10/31/2014 0312   BUN 8 12/29/2013 1214   CREATININE 0.81 11/02/2014 2156   CREATININE 0.86 12/29/2013 1214   CALCIUM 8.3* 10/31/2014 0312   CALCIUM 8.3* 12/29/2013 1214   GFRNONAA >60 11/02/2014 2156   GFRNONAA >60 12/29/2013 1214   GFRAA >60 11/02/2014 2156   GFRAA >60 12/29/2013 1214    Assessment/Planning: 54 year old male s/p Day Post-Op: Aortogram and selective right lower extremity angiogram, Percutaneous transluminal angioplasty of right posterior tibial artery and tibioperoneal trunk with 3 mm diameter conventional and 4 mm diameter drug-coated angioplasty balloon, Percutaneous transluminal angioplasty of right popliteal artery with 5 mm diameter by 15 cm length Lutonix drug-coated angioplasty balloon. Doing well. 1) Patient to follow up in 3-4 weeks 2) Vascular surgery will sign off - please reconsult if needed.   Marcelle Overlie PA-C 11/03/2014 9:13 AM

## 2014-11-03 NOTE — Clinical Documentation Improvement (Signed)
Internal Medicine  A cause and effect relationship may not be assumed and must be documented by a provider.  Please clarify the relationship, if any, between  Right great toe osteomyelitis and Diabetes  Are the conditions:   Due to or associated with each other  Unrelated to each other  Other  Clinically Undetermined   Supporting Information (risk factors, sign and symptoms, diagnostics, treatment): Gangrene with osteomyelitis right great toe. diabetes type 2-Continue sliding scale insulin for now. -Noncompliant and was not taking oral meds.  Please exercise your independent, professional judgment when responding. A specific answer is not anticipated or expected. Please update your documentation within the medical record to reflect your response to this query.  Thank You,  [place] [phone]

## 2014-11-03 NOTE — Discharge Instructions (Signed)

## 2014-11-03 NOTE — Care Management (Addendum)
Crutches requested from Will with [employer] and patient agrees; he declined walker. Patient states he "has surgical boot on his foot". He agrees to establishing with PCP for DM tx and to follow up with Dr. Sharlotte Alamo for wound care. Appointment made with Dr. Cleda Mccreedy on 11/05/14 and placed on discharge follow up. He has been made of aware of follow up with Dr. Lucky Cowboy in November and patient agrees. He states he has transportation. Appointment made with Dr[facility]Singh at [facility] clinic. Patient advised to go to [facility] walk-in clinic until then for Rx refills to treat DM- he agrees and knows where it is located. He states his Medicare is updated and paying for services.

## 2014-11-03 NOTE — Progress Notes (Signed)
1 Day Post-Op  Subjective: Patient seen. No complaints of pain.  Objective: Vital signs in last 24 hours: Temp:  [98 F (36.7 C)-100.4 F (38 C)] 100.4 F (38 C) (10/11 0405) Pulse Rate:  [63-81] 74 (10/11 0405) Resp:  [14-18] 18 (10/11 0405) BP: (112-141)/(75-81) 128/75 mmHg (10/11 0405) SpO2:  [98 %-100 %] 99 % (10/11 0405) Last BM Date: 10/31/14  Intake/Output from previous day: 10/10 0701 - 10/11 0700 In: 1180 [P.O.:480; IV Piggyback:700] Out: 1200 [Urine:1200] Intake/Output this shift:    Minimal drainage on the bandage. The incision is still well coapted with the exception of the small area of dehiscence along the base of the second toe. No significant change in the discoloration of the skin or toes. Does not clearly show any progression devascularization. PT pulse is palpable today.  Lab Results:  No results for input(s): WBC, HGB, HCT, PLT in the last 72 hours. BMET  Recent Labs  11/01/14 0924 11/02/14 2156  K 3.7  --   CREATININE  --  0.81   PT/INR No results for input(s): LABPROT, INR in the last 72 hours. ABG No results for input(s): PHART, HCO3 in the last 72 hours.  Invalid input(s): PCO2, PO2  Studies/Results: No results found.  Anti-infectives: Anti-infectives    Start     Dose/Rate Route Frequency Ordered Stop   11/03/14 1000  vancomycin (VANCOCIN) 1,500 mg in sodium chloride 0.9 % 500 mL IVPB     1,500 mg 250 mL/hr over 120 Minutes Intravenous Every 12 hours 11/03/14 0051     10/31/14 0000  vancomycin (VANCOCIN) IVPB 1000 mg/200 mL premix  Status:  Discontinued     1,000 mg 200 mL/hr over 60 Minutes Intravenous Every 24 hours 10/30/14 1325 10/30/14 1420   10/30/14 2200  piperacillin-tazobactam (ZOSYN) IVPB 3.375 g     3.375 g 12.5 mL/hr over 240 Minutes Intravenous 3 times per day 10/30/14 1952     10/30/14 2200  vancomycin (VANCOCIN) 1,250 mg in sodium chloride 0.9 % 250 mL IVPB  Status:  Discontinued     1,250 mg 166.7 mL/hr over 90  Minutes Intravenous Every 12 hours 10/30/14 1952 11/03/14 0050   10/30/14 1500  vancomycin (VANCOCIN) 1,250 mg in sodium chloride 0.9 % 250 mL IVPB     1,250 mg 166.7 mL/hr over 90 Minutes Intravenous  Once 10/30/14 1420 10/30/14 1748   10/30/14 1415  piperacillin-tazobactam (ZOSYN) IVPB 3.375 g     3.375 g 100 mL/hr over 30 Minutes Intravenous  Once 10/30/14 1413 10/30/14 1524   10/30/14 1330  piperacillin-tazobactam (ZOSYN) IVPB 3.375 g  Status:  Discontinued     3.375 g 12.5 mL/hr over 240 Minutes Intravenous 4 times per day 10/30/14 1325 10/30/14 1413      Assessment/Plan: s/p Procedure(s): Abdominal Aortogram w/Lower Extremity (N/A) Lower Extremity Intervention Assessment: Stable status post amputation right great toe and revascularization   Plan: Betadine and a sterile dressing reapplied to the right foot. At this point I think the patient should be stable for discharge and evaluation outpatient on appropriate antibiotics. He will leave the bandage dry and intact area and no need for home health at this point. Follow up in the clinic in about 1 week  LOS: 4 days    Kailey Esquilin W. 11/03/2014

## 2014-11-04 LAB — CULTURE, BLOOD (ROUTINE X 2)
CULTURE: NO GROWTH
Culture: NO GROWTH

## 2014-11-04 LAB — SURGICAL PATHOLOGY

## 2014-11-05 LAB — ANAEROBIC CULTURE

## 2014-11-25 ENCOUNTER — Ambulatory Visit: Payer: PRIVATE HEALTH INSURANCE | Admitting: Surgery

## 2014-11-26 ENCOUNTER — Encounter: Payer: No Typology Code available for payment source | Attending: Surgery | Admitting: Surgery

## 2014-11-26 DIAGNOSIS — F17218 Nicotine dependence, cigarettes, with other nicotine-induced disorders: Secondary | ICD-10-CM | POA: Insufficient documentation

## 2014-11-26 DIAGNOSIS — M1990 Unspecified osteoarthritis, unspecified site: Secondary | ICD-10-CM | POA: Insufficient documentation

## 2014-11-26 DIAGNOSIS — M86671 Other chronic osteomyelitis, right ankle and foot: Secondary | ICD-10-CM | POA: Diagnosis not present

## 2014-11-26 DIAGNOSIS — T8131XA Disruption of external operation (surgical) wound, not elsewhere classified, initial encounter: Secondary | ICD-10-CM | POA: Insufficient documentation

## 2014-11-26 DIAGNOSIS — E11621 Type 2 diabetes mellitus with foot ulcer: Secondary | ICD-10-CM | POA: Insufficient documentation

## 2014-11-26 DIAGNOSIS — N186 End stage renal disease: Secondary | ICD-10-CM | POA: Diagnosis not present

## 2014-11-26 DIAGNOSIS — X58XXXA Exposure to other specified factors, initial encounter: Secondary | ICD-10-CM | POA: Diagnosis not present

## 2014-11-26 DIAGNOSIS — G629 Polyneuropathy, unspecified: Secondary | ICD-10-CM | POA: Insufficient documentation

## 2014-11-26 DIAGNOSIS — I70235 Atherosclerosis of native arteries of right leg with ulceration of other part of foot: Secondary | ICD-10-CM | POA: Insufficient documentation

## 2014-11-26 DIAGNOSIS — I120 Hypertensive chronic kidney disease with stage 5 chronic kidney disease or end stage renal disease: Secondary | ICD-10-CM | POA: Diagnosis not present

## 2014-11-26 DIAGNOSIS — L97514 Non-pressure chronic ulcer of other part of right foot with necrosis of bone: Secondary | ICD-10-CM | POA: Insufficient documentation

## 2014-11-26 DIAGNOSIS — T8781 Dehiscence of amputation stump: Secondary | ICD-10-CM | POA: Insufficient documentation

## 2014-11-26 NOTE — Progress Notes (Addendum)
DANIELL, TINDER ([id number]) Visit Report for 11/26/2014 Chief Complaint Document Details Patient Name: [name] Date of Service: 11/26/2014 8:45 AM Medical Record Number: [id number] Patient Account Number: [id number] Date of Birth/Sex: 08-31-1960 (54 y.o. Male) Treating RN: Cornell Barman Primary Care Physician: PATIENT, NO Other Clinician: Referring Physician: CLINE, TODD Treating Physician/Extender: Frann Rider in Treatment: 0 Information Obtained from: Patient Chief Complaint Patient in today for treatment of non-healing wound and HBO Treatment. This patient has had an recent surgery for gangrene and osteomyelitis of his right big toe with postoperative flap teased since and residual osteomyelitis for the last 3 weeks. Electronic Signature(s) Signed: 11/26/2014 10:04:11 AM By: Christin Fudge MD, FACS Previous Signature: 11/26/2014 9:12:00 AM Version By: Christin Fudge MD, FACS Entered By: Christin Fudge on 11/26/2014 10:04:11 [name] ([id number]) -------------------------------------------------------------------------------- HPI Details Patient Name: [name] Date of Service: 11/26/2014 8:45 AM Medical Record Number: [id number] Patient Account Number: [id number] Date of Birth/Sex: 1960-06-16 (54 y.o. Male) Treating RN: Cornell Barman Primary Care Physician: PATIENT, NO Other Clinician: Referring Physician: CLINE, TODD Treating Physician/Extender: Frann Rider in Treatment: 0 History of Present Illness Location: open wound at the right first metatarsal region Quality: Patient reports No Pain. Severity: Patient states wound are getting worse. Duration: Patient has had the wound for < 3 weeks prior to presenting for treatment Context: The wound appeared gradually over time Modifying Factors: Other treatment(s) tried include:has had angioplasty and amputation of his right big toe Associated Signs and Symptoms: Patient reports having difficulty standing for  long periods. HPI Description: 54 year old gentleman,with a known history of diabetes and not on any medication for the past 1 year due to noncompliance, heavy tobacco abuse, initially presented with a blister at the bottom of his right great toe about 2 weeks duration and this was noted in early October.The patient had a gangrenous right toe and was found to have poor blood flow for amputation was taken up by Dr. Lucky Cowboy for angiography and this was done on 11/02/2014. He had a successful angioplasty of the right posterior tibial artery and the right popliteal artery with appropriate angiograms. prior to that on 10/31/2014 Dr. Caryl Comes from podiatry had taken up for surgery for gangrene with osteomyelitis of the right great toe. this was done through the MP joint, and sutured closed. his workup prior to surgery had indicated that he had no DVT of the right lower extremity and no venous reflex. A plain x-ray of the right foot confirmed air in the region of the right great toe suggesting gangrene and there was osteomyelitis of the right first metatarsal consistent with osteomyelitis. he was discharged home on 11/03/2014 with oral Augmentin for 2 weeks, oral medications for his diabetes mellitus and his last hemoglobin A1c was 7.7. Of note the final pathology specimen showed osteomyelitis and soft tissue necrosis are close to the proximal margin of the specimen. Electronic Signature(s) Signed: 11/26/2014 10:05:06 AM By: Christin Fudge MD, FACS Previous Signature: 11/26/2014 9:12:07 AM Version By: Christin Fudge MD, FACS Previous Signature: 11/26/2014 9:10:28 AM Version By: Christin Fudge MD, FACS Previous Signature: 11/26/2014 9:06:33 AM Version By: Christin Fudge MD, FACS Previous Signature: 11/26/2014 9:02:33 AM Version By: Christin Fudge MD, FACS Entered By: Christin Fudge on 11/26/2014 10:05:06 MICHAIL, GARTEN  ([id number]) -------------------------------------------------------------------------------- Physical Exam Details Patient Name: [name] Date of Service: 11/26/2014 8:45 AM Medical Record Number: [id number] Patient Account Number: [id number] Date of Birth/Sex: 03/07/1960 (54 y.o. Male) Treating RN: Cornell Barman Primary Care Physician: PATIENT, NO Other Clinician: Referring Physician:  CLINE, TODD Treating Physician/Extender: Christin Fudge Weeks in Treatment: 0 Constitutional . Pulse regular. Respirations normal and unlabored. Afebrile. . Eyes Nonicteric. Reactive to light. Ears, Nose, Mouth, and Throat Lips, teeth, and gums WNL.Marland Kitchen Moist mucosa without lesions . Neck supple and nontender. No palpable supraclavicular or cervical adenopathy. Normal sized without goiter. Respiratory WNL. No retractions.. Cardiovascular Pedal Pulses palpable but ABI was not measured due to the breech recent angioplasty.. No clubbing, cyanosis or edema. Gastrointestinal (GI) Abdomen without masses or tenderness.. No liver or spleen enlargement or tenderness.. Lymphatic No adneopathy. No adenopathy. No adenopathy. Musculoskeletal Adexa without tenderness or enlargement.. Digits and nails w/o clubbing, cyanosis, infection, petechiae, ischemia, or inflammatory conditions.. Integumentary (Hair, Skin) No suspicious lesions. No crepitus or fluctuance. No peri-wound warmth or erythema. No masses.Marland Kitchen Psychiatric Judgement and insight Intact.. No evidence of depression, anxiety, or agitation.. Notes He has a large open wound at the base of the first metatarsal where a previous amputation was done. There is significant amount of soft tissue debris and there is some slough. Does not probe down to bone. No evidence of cellulitis or purulent discharge. Electronic Signature(s) Signed: 11/26/2014 10:06:30 AM By: Christin Fudge MD, FACS Previous Signature: 11/26/2014 9:12:14 AM Version By: Christin Fudge MD,  FACS Entered By: Christin Fudge on 11/26/2014 10:06:30 [name] ([id number]) -------------------------------------------------------------------------------- Physician Orders Details Patient Name: [name] Date of Service: 11/26/2014 8:45 AM Medical Record Number: [id number] Patient Account Number: [id number] Date of Birth/Sex: 1960-08-22 (54 y.o. Male) Treating RN: Cornell Barman Primary Care Physician: PATIENT, NO Other Clinician: Referring Physician: CLINE, TODD Treating Physician/Extender: Frann Rider in Treatment: 0 Verbal / Phone Orders: Yes Clinician: Cornell Barman Read Back and Verified: Yes Diagnosis Coding ICD-10 Coding Code Description E11.621 Type 2 diabetes mellitus with foot ulcer F17.218 Nicotine dependence, cigarettes, with other nicotine-induced disorders Wound Cleansing Wound #1 Right Toe Great o Clean wound with wound cleanser. Primary Wound Dressing Wound #1 Right Toe Great o Saline moistened gauze - Hydrogel Ag Secondary Dressing Wound #1 Right Toe Great o Gauze and Kerlix/Conform Dressing Change Frequency Wound #1 Right Toe Great o Change dressing every day. Follow-up Appointments Wound #1 Right Toe Great o Return Appointment in 1 week. Additional Orders / Instructions Wound #1 Right Toe Great o Stop Smoking o Increase protein intake. Hyperbaric Oxygen Therapy Wound #1 Right Toe Great o Evaluate for HBO Therapy o Indication: - Osteomyelitis o If appropriate for treatment, begin HBOT per protocol: AVROM, GRINDEL ([id number]) o 2.0 ATA for 90 Minutes without Air Breaks o One treatment per day (delivered Monday through Friday unless otherwise specified in Special Instructions below): o Total # of Treatments: - 40 o Finger stick Blood Glucose Pre- and Post- HBOT Treatment. o Follow Hyperbaric Oxygen Glycemia Protocol HBO Contraindications Wound #1 Right Toe Great - Right Lower Extremity o HBO  contraindications of hyperbaric oxygen therapy were reviewed and the patient found to have no untreated pneumothorax or history of spontaneous pneumothorax. o HBO contraindications of hyperbaric oxygen therapy were reviewed and the patient found to have no history of medications such as Bleomycin, Adriamycin, disulfiram, cisplatin and sulfamylon and is not currently receiving any chemotherapy. o HBO contraindications of hyperbaric oxygen therapy were reviewed and the patient found to have no Upper respiratory infection and chronic sinusitis. o HBO contraindications of hyperbaric oxygen therapy were reviewed and the patient found to have no history of retinal surgery proceeding 6 weeks or intraocular gas o HBO contraindications of hyperbaric oxygen therapy were reviewed and the patient found  to have no history seizure disorder or any anticonvulsant medication. o HBO contraindications of hyperbaric oxygen therapy were reviewed and the patient found to have no septicemia with CO2 retention. o HBO contraindications of hyperbaric oxygen therapy were reviewed and the patient found to have no fever greater than 100 degrees. o HBO contraindications of hyperbaric oxygen therapy were reviewed and the patient found to have no pregnancy noted. o HBO contraindications of hyperbaric oxygen therapy were reviewed and the patient found to have no medications such as steroids or narcotics or Phenergan. Medications-please add to medication list. Wound #1 Right Toe Great - Right Lower Extremity o P.O. Antibiotics [employer] o chest xray oooo o EKG [place], Zhane ([id number]) oooo GLYCEMIA INTERVENTIONS PROTOCOL PRE-HBO GLYCEMIA INTERVENTIONS ACTION INTERVENTION Obtain pre-HBO capillary blood 1 glucose (ensure physician order is in chart). A. Notify HBO physician and await physician orders. 2 If result is 70 mg/dl or below: B. If the result meets the hospital definition of  a critical result, follow hospital policy. A. Give patient an 8 ounce Glucerna Shake, an 8 ounce Ensure, or 8 ounces of a Glucerna/Ensure equivalent dietary supplement*. B. Wait 30 minutes. If result is 71 mg/dl to 130 mg/dl: C. Retest patientos capillary blood glucose (CBG). D. If result greater than or equal to 110 mg/dl, proceed with HBO. If result less than 110 mg/dl, notify HBO physician and consider holding HBO. If result is 131 mg/dl to 249 mg/dl: A. Proceed with HBO. A. Notify HBO physician and await physician orders. B. It is recommended to hold HBO and do blood/urine ketone If result is 250 mg/dl or greater: testing. C. If the result meets the hospital definition of a critical result, follow hospital policy. POST-HBO GLYCEMIA INTERVENTIONS ACTION INTERVENTION Obtain post HBO capillary blood 1 glucose (ensure physician order is in chart). A. Notify HBO physician and await physician orders. 2 If result is 70 mg/dl or below: B. If the result meets the hospital definition of a critical result, follow hospital policy. If result is 71 mg/dl to 100 mg/dl: A. Give patient an 8 ounce Glucerna Shake, an 8 ounce Ensure, or 8 ounces of a [place], [place] ([id number]) Glucerna/Ensure equivalent dietary supplement*. B. Wait 15 minutes for symptoms of hypoglycemia (i.e. nervousness, anxiety, sweating, chills, clamminess, irritability, confusion, tachycardia or dizziness). C. If patient asymptomatic, discharge patient. If patient symptomatic, repeat capillary blood glucose (CBG) and notify HBO physician. If result is 101 mg/dl to 249 mg/dl: A. Discharge patient. A. Notify HBO physician and await physician orders. B. It is recommended to do If result is 250 mg/dl or greater: blood/urine ketone testing. C. If the result meets the hospital definition of a critical result, follow hospital policy. *Juice or candies are NOT equivalent products. If patient refuses  the Glucerna or Ensure, please consult the hospital dietitian for an appropriate substitute. Electronic Signature(s) Signed: 11/26/2014 3:36:42 PM By: Christin Fudge MD, FACS Signed: 11/26/2014 4:59:44 PM By: Gretta Cool RN, BSN, Kim RN, BSN Entered By: Gretta Cool, RN, BSN, Kim on 11/26/2014 09:40:30 LAMARIAN, ANSTEY ([id number]) -------------------------------------------------------------------------------- Problem List Details Patient Name: [name] Date of Service: 11/26/2014 8:45 AM Medical Record Number: [id number] Patient Account Number: [id number] Date of Birth/Sex: 1960-06-23 (54 y.o. 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 M86.671 Other chronic osteomyelitis, right ankle and foot 11/26/2014 Yes T87.81 Dehiscence of amputation stump 11/26/2014 Yes T81.31XA Disruption of external operation (surgical) wound, not 11/26/2014 Yes elsewhere classified, initial encounter Inactive Problems Resolved Problems Electronic Signature(s) Signed: 11/26/2014 10:03:16 AM By: Christin Fudge MD, FACS Previous Signature: 11/26/2014 9:11:48 AM Version By: Christin Fudge MD, FACS Previous Signature: 11/26/2014 9:11:39 AM Version By: Christin Fudge MD, FACS [place]ch [place], Siddhant ([id number]) Entered By: Christin Fudge on 11/26/2014 10:03:16 [name]  ([id number]) -------------------------------------------------------------------------------- Progress Note Details Patient Name: [name] Date of Service: 11/26/2014 8:45 AM Medical Record Number: [id number] Patient Account Number: [id number] Date of Birth/Sex: Jun 05, 1960 (54 y.o. Male) Treating RN: Cornell Barman Primary Care Physician: PATIENT, NO Other Clinician: Referring Physician: CLINE, TODD Treating Physician/Extender: Frann Rider in Treatment: 0 Subjective Chief Complaint Information obtained from Patient Patient in today for treatment of non-healing wound and HBO Treatment. This patient has had an recent surgery for gangrene and osteomyelitis of his right big toe with postoperative flap teased since and residual osteomyelitis for the last 3 weeks. History of Present Illness (HPI) The following HPI elements were documented for the patient's wound: Location: open wound at the right first metatarsal region Quality: Patient reports No Pain. Severity: Patient states wound are getting worse. Duration: Patient has had the wound for < 3 weeks prior to presenting for treatment Context: The wound appeared gradually over time Modifying Factors: Other treatment(s) tried include:has had angioplasty and amputation of his right big toe Associated Signs and Symptoms: Patient reports having difficulty standing for long periods. 54 year old gentleman,with a known history of diabetes and not on any medication for the past 1 year due to noncompliance, heavy tobacco abuse, initially presented with a blister at the bottom of his right great toe about 2 weeks duration and this was noted in early October.The patient had a gangrenous right toe and was found to have poor blood flow for amputation was taken up by Dr. Lucky Cowboy for angiography and this was done on 11/02/2014. He had a successful angioplasty of the right posterior tibial artery and the right popliteal artery with appropriate  angiograms. prior to that on 10/31/2014 Dr. Caryl Comes from podiatry had taken up for surgery for gangrene with osteomyelitis of the right great toe. this was done through the MP joint, and sutured closed. his workup prior to surgery had indicated that he had no DVT of the right lower extremity and no venous reflex. A plain x-ray of the right foot confirmed air in the region of the right great toe suggesting gangrene and there was osteomyelitis of the right first metatarsal consistent with osteomyelitis. he was discharged home on 11/03/2014 with oral Augmentin for 2 weeks, oral medications for his diabetes mellitus and his last hemoglobin A1c was 7.7. Of note the final pathology specimen showed osteomyelitis and soft tissue necrosis are close to the proximal margin of the specimen. Wound History Patient presents with 1 open wound that has been present for approximately 1 month. Patient has been treating wound in the following manner: wet to dry. Laboratory tests have been performed in the last month. Patient reportedly has not tested positive for an antibiotic resistant organism. Patient reportedly has tested positive for osteomyelitis. Patient reportedly has had testing performed to evaluate circulation in the legs. JORDY, FOLTYN ([id number]) Patient  History Information obtained from Patient. Allergies No Known Drug Allergies Family History Diabetes - Mother, No family history of Cancer, Heart Disease, Hypertension, Kidney Disease, Lung Disease, Seizures, Stroke, Thyroid Problems. Social History Current every day smoker, Marital Status - Single, Alcohol Use - Never, Drug Use - No History, Caffeine Use - Daily. Medical History Eyes Denies history of Cataracts, Glaucoma, Optic Neuritis Ear/Nose/Mouth/Throat Denies history of Chronic sinus problems/congestion, Middle ear problems Hematologic/Lymphatic Denies history of Anemia, Hemophilia, Human Immunodeficiency Virus, Lymphedema, Sickle  Cell Disease Respiratory Denies history of Aspiration, Asthma, Chronic Obstructive Pulmonary Disease (COPD), Pneumothorax, Sleep Apnea, Tuberculosis Cardiovascular Patient has history of Peripheral Venous Disease - unspecified Denies history of Angina, Arrhythmia, Congestive Heart Failure, Coronary Artery Disease, Deep Vein Thrombosis, Hypertension, Hypotension, Myocardial Infarction, Peripheral Arterial Disease, Phlebitis, Vasculitis Gastrointestinal Denies history of Cirrhosis , Colitis, Crohn s, Hepatitis A, Hepatitis B, Hepatitis C Endocrine Patient has history of Type II Diabetes - 2015 Denies history of Type I Diabetes Genitourinary Patient has history of End Stage Renal Disease Immunological Denies history of Lupus Erythematosus, Raynaud s, Scleroderma Integumentary (Skin) Denies history of History of Burn, History of pressure wounds Musculoskeletal Patient has history of Osteoarthritis - shoulder Denies history of Gout, Rheumatoid Arthritis, Osteomyelitis Neurologic Patient has history of Neuropathy Denies history of Dementia, Quadriplegia, Paraplegia Oncologic Denies history of Received Chemotherapy, Received Radiation TYRANN, GRUETT ([id number]) Psychiatric Denies history of Anorexia/bulimia, Confinement Anxiety Patient is treated with Oral Agents. Blood sugar is not tested. Hospitalization/Surgery History - 10/31/2014, [facility], Amputation of great toe. Medical And Surgical History Notes Constitutional Symptoms (General Health) Type II Diabetes Review of Systems (ROS) Constitutional Symptoms (General Health) The patient has no complaints or symptoms. Eyes The patient has no complaints or symptoms. Ear/Nose/Mouth/Throat The patient has no complaints or symptoms. Hematologic/Lymphatic The patient has no complaints or symptoms. Respiratory The patient has no complaints or symptoms. Cardiovascular The patient has no complaints or symptoms. Gastrointestinal The  patient has no complaints or symptoms. Endocrine The patient has no complaints or symptoms. Genitourinary The patient has no complaints or symptoms. Immunological The patient has no complaints or symptoms. Integumentary (Skin) Complains or has symptoms of Wounds. Denies complaints or symptoms of Bleeding or bruising tendency, Breakdown, Swelling. Musculoskeletal The patient has no complaints or symptoms. Neurologic Complains or has symptoms of Numbness/parasthesias - right foot. Oncologic The patient has no complaints or symptoms. Psychiatric The patient has no complaints or symptoms. Objective WESTYN, SKOUSEN ([id number]) Constitutional Pulse regular. Respirations normal and unlabored. Afebrile. Vitals Time Taken: 9:02 AM, Height: 67 in, Source: Stated, Weight: 165 lbs, Source: Stated, BMI: 25.8, Temperature: 98.1 F, Pulse: 72 bpm, Respiratory Rate: 16 breaths/min, Blood Pressure: 124/72 mmHg. Eyes Nonicteric. Reactive to light. Ears, Nose, Mouth, and Throat Lips, teeth, and gums WNL.Marland Kitchen Moist mucosa without lesions . Neck supple and nontender. No palpable supraclavicular or cervical adenopathy. Normal sized without goiter. Respiratory WNL. No retractions.. Cardiovascular Pedal Pulses palpable but ABI was not measured due to the breech recent angioplasty.. No clubbing, cyanosis or edema. Gastrointestinal (GI) Abdomen without masses or tenderness.. No liver or spleen enlargement or tenderness.. Lymphatic No adneopathy. No adenopathy. No adenopathy. Musculoskeletal Adexa without tenderness or enlargement.. Digits and nails w/o clubbing, cyanosis, infection, petechiae, ischemia, or inflammatory conditions.Marland Kitchen Psychiatric Judgement and insight Intact.. No evidence of depression, anxiety, or agitation.. General Notes: He has a large open wound at the base of the first metatarsal where a previous amputation was done. There is significant amount of soft tissue debris and there is  some slough. Does not probe down to bone. No evidence of cellulitis or purulent discharge. Integumentary (Hair, Skin) No suspicious lesions. No crepitus or fluctuance. No peri-wound warmth or erythema. No masses.. Wound #1 status is Open. Original cause of wound was Surgical Injury. The wound is located on the Right Toe Great. The wound measures 3.2cm length x 2cm width x 1.4cm depth; 5.027cm^2 area and 7.037cm^3 volume. The wound is limited to skin breakdown. There is a large amount of serosanguineous drainage noted. The wound margin is flat and intact. There is small (1-33%) red granulation within the wound bed. There is a large (67-100%) amount of necrotic tissue within the wound bed including Adherent Slough. The periwound skin appearance exhibited: Moist. The periwound skin appearance did not exhibit: Callus, Krul, Avinash ([id number]) Crepitus, Excoriation, Fluctuance, Friable, Induration, Localized Edema, Rash, Scarring, Dry/Scaly, Maceration, Atrophie Blanche, Cyanosis, Ecchymosis, Hemosiderin Staining, Mottled, Pallor, Rubor, Erythema. 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 After doing a thorough review of his recent surgery and the pathology, I  have recommended: 1. Continue to follow local wound care as outlined by his podiatrist Dr. Cleda Mccreedy. 2. He will benefit greatly from hyperbaric oxygen therapy and I have recommended the protocol for chronic osteomyelitis. 3. He completely give up smoking and I spent a great deal of time discussing the risks benefits and alternatives and the methodology to achieve smoking cessation. 4. see his PCP as soon as possible regarding proper control of his diabetes mellitus, close monitoring of his blood glucose levels and get medication for smoking cessation. We have spent a great deal of time discussing all the risks benefits alternatives and possible complications of hyperbaric oxygen therapy and after thorough discussion he is willing to proceed. We will start working on his insurance clearance and begin hyperbaric oxygen therapy treatments as soon as possible. Plan Wound Cleansing: Wound #1 Right Toe Great: Clean wound with wound cleanser. Primary Wound Dressing: Wound #1 Right Toe GreatFLORY, STEEVER ([id number]) Saline moistened gauze - Hydrogel Ag Secondary Dressing: Wound #1 Right Toe Great: Gauze and Kerlix/Conform Dressing Change Frequency: Wound #1 Right Toe Great: Change dressing every day. Follow-up Appointments: Wound #1 Right Toe Great: Return Appointment in 1 week. Additional Orders / Instructions: Wound #1 Right Toe Great: Stop Smoking Increase protein intake. Hyperbaric Oxygen Therapy: Wound #1 Right Toe Great: Evaluate for HBO Therapy Indication: - Osteomyelitis If appropriate for treatment, begin HBOT per protocol: 2.0 ATA for 90 Minutes without Air Breaks One treatment per day (delivered Monday through Friday unless otherwise specified in Special Instructions below): Total # of Treatments: - 40 Finger stick Blood Glucose Pre- and Post- HBOT Treatment. Follow Hyperbaric Oxygen Glycemia Protocol HBO Contraindications: Wound #1 Right Toe Great: HBO  contraindications of hyperbaric oxygen therapy were reviewed and the patient found to have no untreated pneumothorax or history of spontaneous pneumothorax. HBO contraindications of hyperbaric oxygen therapy were  reviewed and the patient found to have no history of medications such as Bleomycin, Adriamycin, disulfiram, cisplatin and sulfamylon and is not currently receiving any chemotherapy. HBO contraindications of hyperbaric oxygen therapy were reviewed and the patient found to have no Upper respiratory infection and chronic sinusitis. HBO contraindications of hyperbaric oxygen therapy were reviewed and the patient found to have no history of retinal surgery proceeding 6 weeks or intraocular gas HBO contraindications of hyperbaric oxygen therapy were reviewed and the patient found to have no history seizure disorder or any anticonvulsant medication. HBO contraindications of hyperbaric oxygen therapy were reviewed and the patient found to have no septicemia with CO2 retention. HBO contraindications of hyperbaric oxygen therapy were reviewed and the patient found to have no fever greater than 100 degrees. HBO contraindications of hyperbaric oxygen therapy were reviewed and the patient found to have no pregnancy noted. HBO contraindications of hyperbaric oxygen therapy were reviewed and the patient found to have no medications such as steroids or narcotics or Phenergan. Medications-please add to medication list.: Wound #1 Right Toe Great: P.O. Antibiotics ordered were: chest xray, EKG ALEXIO, SHARPSTEEN ([id number]) This 54 year old gentleman with uncontrolled and untreated diabetes mellitus and a Wagner grade 4 diabetic foot ulcer, status post recent amputation for osteomyelitis and gangrene, also has significant peripheral vascular disease. After doing a thorough review of his recent surgery and the pathology, I have recommended: 1. Continue to follow local wound care as outlined by his  podiatrist Dr. Cleda Mccreedy. 2. He will benefit greatly from hyperbaric oxygen therapy and I have recommended the protocol for chronic osteomyelitis. 3. He completely give up smoking and I spent a great deal of time discussing the risks benefits and alternatives and the methodology to achieve smoking cessation. 4. see his PCP as soon as possible regarding proper control of his diabetes mellitus, close monitoring of his blood glucose levels and get medication for smoking cessation. We have spent a great deal of time discussing all the risks benefits alternatives and possible complications of hyperbaric oxygen therapy and after thorough discussion he is willing to proceed. We will start working on his insurance clearance and begin hyperbaric oxygen therapy treatments as soon as possible. Electronic Signature(s) Signed: 12/01/2014 4:35:47 PM By: Christin Fudge MD, FACS Previous Signature: 11/26/2014 10:12:00 AM Version By: Christin Fudge MD, FACS Previous Signature: 11/26/2014 9:12:22 AM Version By: Christin Fudge MD, FACS Entered By: Christin Fudge on 12/01/2014 16:35:47 PETRUS, HOSHAW ([id number]) -------------------------------------------------------------------------------- ROS/PFSH Details Patient Name: [name] Date of Service: 11/26/2014 8:45 AM Medical Record Number: [id number] Patient Account Number: [id number] Date of Birth/Sex: 1960/05/04 (54 y.o. Male) Treating RN: Cornell Barman Primary Care Physician: PATIENT, NO Other Clinician: Referring Physician: CLINE, TODD Treating Physician/Extender: Frann Rider in Treatment: 0 Information Obtained From Patient Wound History Do you currently have one or more open woundso Yes How many open wounds do you currently haveo 1 Approximately how long have you had your woundso 1 month How have you been treating your wound(s) until nowo wet to dry Has your wound(s) ever healed and then re-openedo No Have you had any lab work done in the past  montho Yes Have you tested positive for an antibiotic resistant organism (MRSA, VRE)o No Have you tested positive for osteomyelitis (bone infection)o Yes Date: 10/31/2014 Have you had any tests for circulation on your legso Yes Who ordered the testo Dr Cleda Mccreedy Where was the test doneo 12/15/2014 Integumentary (Skin) Complaints and Symptoms: Positive for: Wounds Negative for: Bleeding or bruising tendency; Breakdown; Swelling  Medical History: Negative for: History of Burn; History of pressure wounds Neurologic Complaints and Symptoms: Positive for: Numbness/parasthesias - right foot Medical History: Positive for: Neuropathy Negative for: Dementia; Quadriplegia; Paraplegia Psychiatric Complaints and Symptoms: No Complaints or Symptoms Complaints and Symptoms: Negative for: Anxiety; JERIMYAH, WILDERMUTH ([id number]) Medical History: Negative for: Anorexia/bulimia; Confinement Anxiety Constitutional Symptoms (General Health) Complaints and Symptoms: No Complaints or Symptoms Medical History: Past Medical History Notes: Type II Diabetes Eyes Complaints and Symptoms: No Complaints or Symptoms Medical History: Negative for: Cataracts; Glaucoma; Optic Neuritis Ear/Nose/Mouth/Throat Complaints and Symptoms: No Complaints or Symptoms Medical History: Negative for: Chronic sinus problems/congestion; Middle ear problems Hematologic/Lymphatic Complaints and Symptoms: No Complaints or Symptoms Medical History: Negative for: Anemia; Hemophilia; Human Immunodeficiency Virus; Lymphedema; Sickle Cell Disease Respiratory Complaints and Symptoms: No Complaints or Symptoms Medical History: Negative for: Aspiration; Asthma; Chronic Obstructive Pulmonary Disease (COPD); Pneumothorax; Sleep Apnea; Tuberculosis Cardiovascular Complaints and Symptoms: No Complaints or Symptoms Medical History: Positive for: Peripheral Venous Disease - unspecified RATHMAN, Jerome  ([id number]) Negative for: Angina; Arrhythmia; Congestive Heart Failure; Coronary Artery Disease; Deep Vein Thrombosis; Hypertension; Hypotension; Myocardial Infarction; Peripheral Arterial Disease; Phlebitis; Vasculitis Gastrointestinal Complaints and Symptoms: No Complaints or Symptoms Medical History: Negative for: Cirrhosis ; Colitis; Crohnos; Hepatitis A; Hepatitis B; Hepatitis C Endocrine Complaints and Symptoms: No Complaints or Symptoms Medical History: Positive for: Type II Diabetes - 2015 Negative for: Type I Diabetes Time with diabetes: 1 year Treated with: Oral agents Blood sugar tested every day: No Genitourinary Complaints and Symptoms: No Complaints or Symptoms Medical History: Positive for: End Stage Renal Disease Immunological Complaints and Symptoms: No Complaints or Symptoms Medical History: Negative for: Lupus Erythematosus; Raynaudos; Scleroderma Musculoskeletal Complaints and Symptoms: No Complaints or Symptoms Medical History: Positive for: Osteoarthritis - shoulder Negative for: Gout; Rheumatoid Arthritis; Osteomyelitis Oncologic ARMONTE, GIRAUD ([id number]) Complaints and Symptoms: No Complaints or Symptoms Medical History: Negative for: Received Chemotherapy; Received Radiation Hospitalization / Surgery History Name of Hospital Purpose of Hospitalization/Surgery Date [facility] Amputation of great toe 10/31/2014 Family and Social History Cancer: No; Diabetes: Yes - Mother; Heart Disease: No; Hypertension: No; Kidney Disease: No; Lung Disease: No; Seizures: No; Stroke: No; Thyroid Problems: No; Current every day smoker; Marital Status - Single; Alcohol Use: Never; Drug Use: No History; Caffeine Use: Daily; Living Will: No; Medical Power of Attorney: No Physician Affirmation I have reviewed and agree with the above information. Electronic Signature(s) Signed: 12/01/2014 4:10:23 PM By: Gretta Cool RN, BSN, Kim RN, BSN Signed: 12/01/2014 4:34:10 PM By:  Christin Fudge MD, FACS Previous Signature: 11/26/2014 10:06:50 AM Version By: Christin Fudge MD, FACS Previous Signature: 11/26/2014 4:59:44 PM Version By: Gretta Cool RN, BSN, Kim RN, BSN Entered By: Gretta Cool, RN, BSN, Kim on 12/01/2014 16:07:43 [name] ([id number]) -------------------------------------------------------------------------------- [place] Details Patient Name: [name] Date of Service: 11/26/2014 Medical Record Number: [id number] Patient Account Number: [id number] Date of Birth/Sex: Apr 24, 1960 (54 y.o. 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 M86.671 Other chronic osteomyelitis, right ankle and foot T87.81 Dehiscence of amputation stump Disruption of external operation (surgical) wound, not elsewhere classified, initial T81.31XA encounter Facility Procedures CPT4 Code Description: [id number]  [place] VISIT-LEV 4 EST PT Modifier: Quantity: 1 CPT4 Code Description: [id number] 99406-SMOKING CESSATION 3-10MINS ICD-10 Description Diagnosis F17.218 Nicotine dependence, cigarettes, with other nicotine-in Modifier: duced disorders Quantity: 1 Physician Procedures CPT4: Description Modifier Quantity Code [id number] - WC PHYS LEVEL 4 - EST PT 1 ICD-10 Description Diagnosis E11.621 Type 2 diabetes mellitus with foot ulcer F17.218 Nicotine dependence, cigarettes, with other nicotine-induced disorders I70.235  Atherosclerosis of native arteries of right leg with ulceration of  other part of foot M86.671 Other chronic osteomyelitis, right ankle and foot CPT4: 99406 99406- SMOKING CESSATION 3-10 MINS 1 Description MCLAIN, BRADWAY ([id number]) Electronic Signature(s) Signed: 11/26/2014 10:12:34 AM By: Christin Fudge MD, FACS Entered By: Christin Fudge on 11/26/2014 10:12:34

## 2014-11-27 NOTE — Progress Notes (Signed)
[name], [name] ([id number]) Visit Report for 11/26/2014 HBO Risk Assessment Details Patient Name: [name], [name] Date of Service: 11/26/2014 8:45 AM Medical Record Number: [id number] Patient Account Number: [id number] Date of Birth/Sex: 12-26-1960 (54 y.o. Male) Treating RN: Cornell Barman Primary Care Physician: PATIENT, NO Other Clinician: Referring Physician: CLINE, TODD Treating Physician/Extender: Frann Rider in Treatment: 0 HBO Risk Assessment Items Answer Barotrauma Risks: Upper Respiratory Infections No Prior Radiation Treatment to Head/Neck No Tracheostomy No Ear problems or surgery (otosclerosis)- Consider pressure equalization tubes No Sinus Problems, Sinus Obstruction No Pulmonary Risks: Currently seeing a pulmonologisto No Emphysema No Pneumothorax No Tuberculosis No Other lung problems (COPD with CO2 retention, lesions, surgery) -Refer to No CPGs Congestive heart Failure -Consider holding HBO if ejection fraction<30% No History of smoking Yes Bullous Disease, Blebs No Other pulmonary abnormalities No Cardiac Risks: Currently seeing a cardiologisto No Pacemaker/AICD No Hypertension No Diuretic Used (water pill). If yes, last time taken: No History of prior or current malignancy (Cancer) Surgery No Radiation therapy No Chemotherapy No Ophthalmic Risks: Optic Neuritis No [name], [name] ([id number]) Cataracts No Myopia No Retinopathy or Retinal Detachment Surgery- Consider pressure equalization No tubes Confinement Anxiety Claustrophobia No Dialysis Dialysis No Any implants; medical or non-medical No Pregnancy No Diabetes Non-Insulin if Yes for Diabetes: Dependent Diabetes Medication: metformin HgbA1C within 3 months No Seizures Seizures No Currently using these medications: Aspirin No Digoxin (CHF patient) No Narcotics No Phenothiazine (Thorazine,etc.) No Prednisone or other steroids No Disulfiram (Antabuse) No Mafenide Acetate  (Sulfamylon-burn cream) No Amiodarone No Electronic Signature(s) Signed: 11/26/2014 4:59:44 PM By: Gretta Cool, RN, BSN, Kim RN, BSN Entered By: Gretta Cool, RN, BSN, Kim on 11/26/2014 09:51:30

## 2014-11-27 NOTE — Progress Notes (Addendum)
MAYCON, GRAP ([id number]) Visit Report for 11/26/2014 Allergy List Details Patient Name: BENHAMIN, DAMBOISE Date of Service: 11/26/2014 8:45 AM Medical Record Number: [id number] Patient Account Number: [id number] Date of Birth/Sex: 11-Jul-1960 (54 y.o. Male) Treating RN: Cornell Barman Primary Care Physician: PATIENT, NO Other Clinician: Referring Physician: CLINE, TODD Treating Physician/Extender: Frann Rider in Treatment: 0 Allergies Active Allergies No Known Drug Allergies Allergy Notes Electronic Signature(s) Signed: 12/01/2014 4:10:23 PM By: Gretta Cool, RN, BSN, Kim RN, BSN Previous Signature: 11/26/2014 4:59:44 PM Version By: Gretta Cool RN, BSN, Kim RN, BSN Entered By: Gretta Cool, RN, BSN, Kim on 12/01/2014 16:07:33 [name] ([id number]) -------------------------------------------------------------------------------- Arrival Information Details Patient Name: [name] Date of Service: 11/26/2014 8:45 AM Medical Record Number: [id number] Patient Account Number: [id number] Date of Birth/Sex: [birth year] (54 y.o. Male) Treating RN: Cornell Barman Primary Care Physician: PATIENT, NO Other Clinician: Referring Physician: CLINE, TODD Treating Physician/Extender: Frann Rider in Treatment: 0 Visit Information Patient Arrived: Ambulatory Arrival Time: 09:01 Accompanied By: self Transfer Assistance: None Patient Identification Verified: Yes Secondary Verification Process Yes Completed: Patient Has Alerts: Yes Patient Alerts: Type II Diabetic Electronic Signature(s) Signed: 12/01/2014 4:10:23 PM By: Gretta Cool, RN, BSN, Kim RN, BSN Previous Signature: 11/26/2014 4:59:44 PM Version By: Gretta Cool RN, BSN, Kim RN, BSN Entered By: Gretta Cool, RN, BSN, Kim on 12/01/2014 [place], [place] ([id number]) -------------------------------------------------------------------------------- Clinic Level of Care Assessment Details Patient Name: [name] Date of Service: 11/26/2014 8:45  AM Medical Record Number: [id number] Patient Account Number: [id number] Date of Birth/Sex: 29-Jul-1960 (54 y.o. Male) Treating RN: Cornell Barman Primary Care Physician: PATIENT, NO Other Clinician: Referring Physician: CLINE, TODD Treating Physician/Extender: Frann Rider in Treatment: 0 Clinic Level of Care Assessment Items TOOL 2 Quantity Score []  - Use when only an EandM is performed on the INITIAL visit 0 ASSESSMENTS - Nursing Assessment / Reassessment X - General Physical Exam (combine w/ comprehensive assessment (listed just 1 20 below) when performed on new pt. evals) X - Comprehensive Assessment (HX, ROS, Risk Assessments, Wounds Hx, etc.) 1 25 ASSESSMENTS - Wound and Skin Assessment / Reassessment X - Simple Wound Assessment / Reassessment - one wound 1 5 []  - Complex Wound Assessment / Reassessment - multiple wounds 0 []  - Dermatologic / Skin Assessment (not related to wound area) 0 ASSESSMENTS - Ostomy and/or Continence Assessment and Care []  - Incontinence Assessment and Management 0 []  - Ostomy Care Assessment and Management (repouching, etc.) 0 PROCESS - Coordination of Care X - Simple Patient / Family Education for ongoing care 1 15 []  - Complex (extensive) Patient / Family Education for ongoing care 0 X - Staff obtains Programmer, systems, Records, Test Results / Process Orders 1 10 []  - Staff telephones HHA, Nursing Homes / Clarify orders / etc 0 []  - Routine Transfer to another Facility (non-emergent condition) 0 []  - Routine Hospital Admission (non-emergent condition) 0 X - New Admissions / Biomedical engineer / Ordering NPWT, Apligraf, etc. 1 15 []  - Emergency Hospital Admission (emergent condition) 0 X - Simple Discharge Coordination 1 10 Grygiel, Quantrell ([id number]) []  - Complex (extensive) Discharge Coordination 0 PROCESS - Special Needs []  - Pediatric / Minor Patient Management 0 []  - Isolation Patient Management 0 []  - Hearing / Language / Visual special  needs 0 []  - Assessment of Community assistance (transportation, D/C planning, etc.) 0 []  - Additional assistance / Altered mentation 0 []  - Support Surface(s) Assessment (bed, cushion, seat, etc.) 0 INTERVENTIONS - Wound Cleansing / Measurement X - Wound Imaging (photographs - any number of wounds)  1 5 []  - Wound Tracing (instead of photographs) 0 X - Simple Wound Measurement - one wound 1 5 []  - Complex Wound Measurement - multiple wounds 0 X - Simple Wound Cleansing - one wound 1 5 []  - Complex Wound Cleansing - multiple wounds 0 INTERVENTIONS - Wound Dressings X - Small Wound Dressing one or multiple wounds 1 10 []  - Medium Wound Dressing one or multiple wounds 0 []  - Large Wound Dressing one or multiple wounds 0 []  - Application of Medications - injection 0 INTERVENTIONS - Miscellaneous []  - External ear exam 0 []  - Specimen Collection (cultures, biopsies, blood, body fluids, etc.) 0 []  - Specimen(s) / Culture(s) sent or taken to Lab for analysis 0 []  - Patient Transfer (multiple staff / Civil Service fast streamer / Similar devices) 0 []  - Simple Staple / Suture removal (25 or less) 0 []  - Complex Staple / Suture removal (26 or more) 0 Brinkmeier, Shjon ([id number]) []  - Hypo / Hyperglycemic Management (close monitor of Blood Glucose) 0 []  - Ankle / Brachial Index (ABI) - do not check if billed separately 0 Has the patient been seen at the hospital within the last three years: Yes Total Score: 125 Level Of Care: New/Established - Level 4 Electronic Signature(s) Signed: 11/26/2014 4:59:44 PM By: Gretta Cool, RN, BSN, Kim RN, BSN Entered By: Gretta Cool, RN, BSN, Kim on 11/26/2014 09:43:14 [name] ([id number]) -------------------------------------------------------------------------------- Encounter Discharge Information Details Patient Name: [name] Date of Service: 11/26/2014 8:45 AM Medical Record Number: [id number] Patient Account Number: [id number] Date of Birth/Sex: [birth year] (54 y.o.  Male) Treating RN: Cornell Barman Primary Care Physician: PATIENT, NO Other Clinician: Referring Physician: CLINE, TODD Treating Physician/Extender: Frann Rider in Treatment: 0 Encounter Discharge Information Items Discharge Pain Level: 0 Discharge Condition: Stable Ambulatory Status: Ambulatory Discharge Destination: Home Transportation: Private Auto Accompanied By: self Schedule Follow-up Appointment: Yes Medication Reconciliation completed and provided to Patient/Care Yes Slayde Brault: Provided on Clinical Summary of Care: 11/26/2014 Form Type Recipient Paper Patient PC Electronic Signature(s) Signed: 11/26/2014 9:55:38 AM By: Ruthine Dose Entered By: Ruthine Dose on 11/26/2014 09:55:37 [place], Saralyn Pilar ([id number]) -------------------------------------------------------------------------------- Lower Extremity Assessment Details Patient Name: [name] Date of Service: 11/26/2014 8:45 AM Medical Record Number: [id number] Patient Account Number: [id number] Date of Birth/Sex: [birth year] (54 y.o. Male) Treating RN: Cornell Barman Primary Care Physician: PATIENT, NO Other Clinician: Referring Physician: CLINE, TODD Treating Physician/Extender: Frann Rider in Treatment: 0 Vascular Assessment Pulses: Posterior Tibial Dorsalis Pedis Palpable: [Right:Yes] Extremity colors, hair growth, and conditions: Extremity Color: [Right:Mottled] Hair Growth on Extremity: [Right:Yes] Temperature of Extremity: [Right:Warm] Capillary Refill: [Right:< 3 seconds] Toe Nail Assessment Left: Right: Thick: No Discolored: No Deformed: No Improper Length and Hygiene: No Electronic Signature(s) Signed: 12/01/2014 4:10:23 PM By: Gretta Cool, RN, BSN, Kim RN, BSN Previous Signature: 11/26/2014 4:59:44 PM Version By: Gretta Cool, RN, BSN, Kim RN, BSN Entered By: Gretta Cool, RN, BSN, Kim on 12/01/2014 16:07:22 CAROLOS, HARLAND  ([id number]) -------------------------------------------------------------------------------- Multi Wound Chart Details Patient Name: [name] Date of Service: 11/26/2014 8:45 AM Medical Record Number: [id number] Patient Account Number: [id number] Date of Birth/Sex: 09-May-1960 (54 y.o. Male) Treating RN: Cornell Barman Primary Care Physician: PATIENT, NO Other Clinician: Referring Physician: CLINE, TODD Treating Physician/Extender: Frann Rider in Treatment: 0 Vital Signs Height(in): 67 Pulse(bpm): 72 Weight(lbs): 165 Blood Pressure 124/72 (mmHg): Body Mass Index(BMI): 26 Temperature(F): 98.1 Respiratory Rate 16 (breaths/min): Photos: [1:No Photos] [N/A:N/A] Wound Location: [1:Right Toe Great] [N/A:N/A] Wounding Event: [1:Surgical Injury] [N/A:N/A] Primary Etiology: [1:Acute Osteomyelitis] [N/A:N/A] Comorbid History: [1:Peripheral Venous Disease,  Type II Diabetes, End Stage Renal Disease, Osteoarthritis, Neuropathy] [N/A:N/A] Date Acquired: [1:10/31/2014] [N/A:N/A] Weeks of Treatment: [1:0] [N/A:N/A] Wound Status: [1:Open] [N/A:N/A] Measurements L x W x D 3.2x2x1.4 [N/A:N/A] (cm) Area (cm) : [1:5.027] [N/A:N/A] Volume (cm) : [1:7.037] [N/A:N/A] Classification: [1:Full Thickness Without Exposed Support Structures] [N/A:N/A] HBO Classification: [1:Grade 3] [N/A:N/A] Wagner Verification: [1:MRI] [N/A:N/A] Exudate Amount: [1:Large] [N/A:N/A] Exudate Type: [1:Serosanguineous] [N/A:N/A] Exudate Color: [1:red, brown] [N/A:N/A] Wound Margin: [1:Flat and Intact] [N/A:N/A] Granulation Amount: [1:Small (1-33%)] [N/A:N/A] Granulation Quality: [1:Red] [N/A:N/A] Necrotic Amount: [1:Large (67-100%)] [N/A:N/A] Exposed Structures: [1:Fascia: No Fat: No Tendon: No] [N/A:N/A] Muscle: No Joint: No Bone: No Limited to Skin Breakdown Periwound Skin Texture: Edema: No N/A N/A Excoriation: No Induration: No Callus: No Crepitus: No Fluctuance: No Friable: No Rash:  No Scarring: No Periwound Skin Moist: Yes N/A N/A Moisture: Maceration: No Dry/Scaly: No Periwound Skin Color: Atrophie Blanche: No N/A N/A Cyanosis: No Ecchymosis: No Erythema: No Hemosiderin Staining: No Mottled: No Pallor: No Rubor: No Tenderness on No N/A N/A Palpation: Wound Preparation: Ulcer Cleansing: N/A N/A Rinsed/Irrigated with Saline Topical Anesthetic Applied: None Treatment Notes Electronic Signature(s) Signed: 11/26/2014 4:59:44 PM By: Gretta Cool, RN, BSN, Kim RN, BSN Entered By: Gretta Cool, RN, BSN, Kim on 11/26/2014 09:34:01 [name] ([id number]) -------------------------------------------------------------------------------- [place] Details Patient Name: [name] Date of Service: 11/26/2014 8:45 AM Medical Record Number: [id number] Patient Account Number: [id number] Date of Birth/Sex: Sep 25, 1960 (54 y.o. Male) Treating RN: Cornell Barman Primary Care Physician: PATIENT, NO Other Clinician: Referring Physician: CLINE, TODD Treating Physician/Extender: Frann Rider in Treatment: 0 Active Inactive HBO Nursing Diagnoses: Anxiety related to feelings of confinement associated with the hyperbaric oxygen chamber Anxiety related to knowledge deficit of hyperbaric oxygen therapy and treatment procedures Discomfort related to temperature and humidity changes inside hyperbaric chamber Potential for barotraumas to ears, sinuses, teeth, and lungs or cerebral gas embolism related to changes in atmospheric pressure inside hyperbaric oxygen chamber Potential for oxygen toxicity seizures related to delivery of 100% oxygen at an increased atmospheric pressure Potential for pulmonary oxygen toxicity related to delivery of 100% oxygen at an increased atmospheric pressure Goals: Patient will tolerate the hyperbaric oxygen therapy treatment Date Initiated: 11/26/2014 Goal Status: Active Interventions: Administer the correct therapeutic gas  delivery based on the patients needs and limitations, per physician order Notes: Orientation to the Wound Care Program Nursing Diagnoses: Knowledge deficit related to the wound healing center program Goals: Patient/caregiver will verbalize understanding of the [facility] Date Initiated: 11/26/2014 Goal Status: Active Interventions: Provide education on orientation to the wound center [place], Saralyn Pilar ([id number]) Notes: Peripheral Neuropathy Nursing Diagnoses: Potential alteration in peripheral tissue perfusion (select prior to confirmation of diagnosis) Goals: Patient/caregiver will verbalize understanding of disease process and disease management Date Initiated: 11/26/2014 Goal Status: Active Interventions: Assess signs and symptoms of neuropathy upon admission and as needed Notes: Soft Tissue Infection Nursing Diagnoses: Impaired tissue integrity Goals: Patient will remain free of wound infection Date Initiated: 11/26/2014 Goal Status: Active Interventions: Assess signs and symptoms of infection every visit Treatment Activities: Systemic antibiotics : 11/26/2014 Notes: Tissue Oxygenation Nursing Diagnoses: Potential alteration in peripheral tissue perfusion (select prior to confirmation of diagnosis) Goals: Patient/caregiver will verbalize understanding of disease process and disease management Date Initiated: 11/26/2014 Goal Status: Active Interventions: Assess patient understanding of disease process and management upon diagnosis and as needed EMMANUAL, RAGANS ([id number]) Treatment Activities: Non-invasive vascular studies : 11/26/2014 Test ordered outside of clinic : 11/26/2014 Notes: Wound/Skin Impairment Nursing Diagnoses: Impaired tissue integrity Goals:  Ulcer/skin breakdown will heal within 14 weeks Date Initiated: 11/26/2014 Goal Status: Active Interventions: Assess patient/caregiver ability to obtain necessary supplies Notes: Electronic  Signature(s) Signed: 12/01/2014 4:10:23 PM By: Gretta Cool, RN, BSN, Kim RN, BSN Previous Signature: 11/26/2014 4:59:44 PM Version By: Gretta Cool, RN, BSN, Kim RN, BSN Entered By: Gretta Cool, RN, BSN, Kim on 12/01/2014 16:08:40 [name] ([id number]) -------------------------------------------------------------------------------- Pain Assessment Details Patient Name: [name] Date of Service: 11/26/2014 8:45 AM Medical Record Number: [id number] Patient Account Number: [id number] Date of Birth/Sex: 10-25-1960 (53 y.o. Male) Treating RN: Cornell Barman Primary Care Physician: PATIENT, NO Other Clinician: Referring Physician: CLINE, TODD Treating Physician/Extender: Frann Rider in Treatment: 0 Active Problems Location of Pain Severity and Description of Pain Patient Has Paino No Site Locations Pain Management and Medication Current Pain Management: Electronic Signature(s) Signed: 12/01/2014 4:10:23 PM By: Gretta Cool, RN, BSN, Kim RN, BSN Previous Signature: 11/26/2014 4:59:44 PM Version By: Gretta Cool, RN, BSN, Kim RN, BSN Entered By: Gretta Cool, RN, BSN, Kim on 12/01/2014 16:07:06 [name] ([id number]) -------------------------------------------------------------------------------- Patient/Caregiver Education Details Patient Name: [name] Date of Service: 11/26/2014 8:45 AM Medical Record Number: [id number] Patient Account Number: [id number] Date of Birth/Gender: Jan 10, 1961 (54 y.o. Male) Treating RN: Cornell Barman Primary Care Physician: PATIENT, NO Other Clinician: Referring Physician: CLINE, TODD Treating Physician/Extender: Frann Rider in Treatment: 0 Education Assessment Education Provided To: Patient Education Topics Provided Wound/Skin Impairment: Handouts: Caring for Your Ulcer, Other: wound care as prescribed. Electronic Signature(s) Signed: 12/01/2014 4:10:23 PM By: Gretta Cool, RN, BSN, Kim RN, BSN Previous Signature: 11/26/2014 4:59:44 PM Version By: Gretta Cool, RN, BSN, Kim  RN, BSN Entered By: Gretta Cool, RN, BSN, Kim on 12/01/2014 16:09:51 [name] ([id number]) -------------------------------------------------------------------------------- Wound Assessment Details Patient Name: [name] Date of Service: 11/26/2014 8:45 AM Medical Record Number: [id number] Patient Account Number: [id number] Date of Birth/Sex: 01/07/1961 (54 y.o. Male) Treating RN: Cornell Barman Primary Care Physician: PATIENT, NO Other Clinician: Referring Physician: CLINE, TODD Treating Physician/Extender: BURNS III, WALTER Weeks in Treatment: 0 Wound Status Wound Number: 1 Primary Acute Osteomyelitis Etiology: Wound Location: Right Toe Great Wound Open Wounding Event: Surgical Injury Status: Date Acquired: 10/31/2014 Comorbid Peripheral Venous Disease, Type II Weeks Of Treatment: 0 History: Diabetes, End Stage Renal Disease, Clustered Wound: No Osteoarthritis, Neuropathy Photos Photo Uploaded By: Gretta Cool, RN, BSN, Kim on 11/26/2014 17:28:16 Wound Measurements Length: (cm) 3.2 % Reduction Width: (cm) 2 % Reduction Depth: (cm) 1.4 Area: (cm) 5.027 Volume: (cm) 7.037 in Area: in Volume: Wound Description Full Thickness Without Exposed Classification: Support Structures Diabetic Severity Grade 3 (Wagner): Earleen Newport Verification: MRI Wound Margin: Flat and Intact Exudate Amount: Large Exudate Type: Serosanguineous Exudate Color: red, brown Wound Bed ANDRIE, KRYSINSKI ([id number]) Granulation Amount: Small (1-33%) Exposed Structure Granulation Quality: Red Fascia Exposed: No Necrotic Amount: Large (67-100%) Fat Layer Exposed: No Necrotic Quality: Adherent Slough Tendon Exposed: No Muscle Exposed: No Joint Exposed: No Bone Exposed: No Limited to Skin Breakdown Periwound Skin Texture Texture Color No Abnormalities Noted: No No Abnormalities Noted: No Callus: No Atrophie Blanche: No Crepitus: No Cyanosis: No Excoriation: No Ecchymosis: No Fluctuance:  No Erythema: No Friable: No Hemosiderin Staining: No Induration: No Mottled: No Localized Edema: No Pallor: No Rash: No Rubor: No Scarring: No Moisture No Abnormalities Noted: No Dry / Scaly: No Maceration: No Moist: Yes Wound Preparation Ulcer Cleansing: Rinsed/Irrigated with Saline Topical Anesthetic Applied: None Treatment Notes Wound #1 (Right Toe Great) 1. Cleansed with: Clean wound with Normal Saline 4. Dressing Applied: Saline moistened guaze 5. Secondary Dressing Applied Gauze and Kerlix/Conform  Electronic Signature(s) Signed: 11/26/2014 4:59:44 PM By: Gretta Cool, RN, BSN, Kim RN, BSN Entered By: Gretta Cool, RN, BSN, Kim on 11/26/2014 09:23:45 ZAKARYA, FRISCHMAN ([id number]) -------------------------------------------------------------------------------- [place] Details Patient Name: [name] Date of Service: 11/26/2014 8:45 AM Medical Record Number: [id number] Patient Account Number: [id number] Date of Birth/Sex: [birth year] (54 y.o. Male) Treating RN: Cornell Barman Primary Care Physician: PATIENT, NO Other Clinician: Referring Physician: CLINE, TODD Treating Physician/Extender: Frann Rider in Treatment: 0 Vital Signs Time Taken: 09:02 Temperature (F): 98.1 Height (in): 67 Pulse (bpm): 72 Source: Stated Respiratory Rate (breaths/min): 16 Weight (lbs): 165 Blood Pressure (mmHg): 124/72 Source: Stated Reference Range: 80 - 120 mg / dl Body Mass Index (BMI): 25.8 Electronic Signature(s) Signed: 12/01/2014 4:10:23 PM By: Gretta Cool, RN, BSN, Kim RN, BSN Previous Signature: 11/26/2014 4:59:44 PM Version By: Gretta Cool, RN, BSN, Kim RN, BSN Entered By: Gretta Cool, RN, BSN, Kim on 12/01/2014 16:07:13

## 2014-11-27 NOTE — Progress Notes (Addendum)
[name] ([id number]) Visit Report for 11/26/2014 Abuse/Suicide Risk Screen Details Patient Name: [name], [name] Date of Service: 11/26/2014 8:45 AM Medical Record Number: [id number] Patient Account Number: [id number] Date of Birth/Sex: [birth year] (54 y.o. Male) Treating RN: [name] Primary Care Physician: PATIENT, NO Other Clinician: Referring Physician: CLINE, TODD Treating Physician/Extender: Frann Rider in Treatment: 0 Abuse/Suicide Risk Screen Items Answer ABUSE/SUICIDE RISK SCREEN: Has anyone close to you tried to hurt or harm you recentlyo No Do you feel uncomfortable with anyone in your familyo No Has anyone forced you do things that you didnot want to doo No Do you have any thoughts of harming yourselfo No Electronic Signature(s) Signed: 12/01/2014 4:10:23 PM By: Gregory Cool, RN, BSN, Kim RN, BSN Previous Signature: 11/26/2014 4:59:44 PM Version By: Gregory Cool, RN, BSN, Kim RN, BSN Entered By: Gregory Cool, RN, BSN, Kim on 12/01/2014 16:07:51 [name] ([id number]) -------------------------------------------------------------------------------- Activities of Daily Living Details Patient Name: [name] Date of Service: 11/26/2014 8:45 AM Medical Record Number: [id number] Patient Account Number: [id number] Date of Birth/Sex: [birth year] (54 y.o. Male) Treating RN: [name] Primary Care Physician: PATIENT, NO Other Clinician: Referring Physician: CLINE, TODD Treating Physician/Extender: Frann Rider in Treatment: 0 Activities of Daily Living Items Answer Activities of Daily Living (Please select one for each item) Drive Automobile Completely Able Take Medications Completely Able Use Telephone Completely Able Care for Appearance Completely Able Use Toilet Completely Able Bath / Shower Completely Able Dress Self Completely Able Feed Self Completely Able Walk Completely Able Get In / Out Bed Completely [place] for Self Completely Able Electronic Signature(s) Signed: 12/01/2014 4:10:23 PM By: Gregory Cool, RN, BSN, Kim RN, BSN Previous Signature: 11/26/2014 4:59:44 PM Version By: Gregory Cool, RN, BSN, Kim RN, BSN Entered By: Gregory Cool, RN, BSN, Kim on 12/01/2014 16:08:00 [name] ([id number]) -------------------------------------------------------------------------------- Education Assessment Details Patient Name: [name] Date of Service: 11/26/2014 8:45 AM Medical Record Number: [id number] Patient Account Number: [id number] Date of Birth/Sex: 01-03-1961 (54 y.o. Male) Treating RN: [name] Primary Care Physician: PATIENT, NO Other Clinician: Referring Physician: CLINE, TODD Treating Physician/Extender: Frann Rider in Treatment: 0 Primary Learner Assessed: Patient Learning Preferences/Education Level/Primary Language Learning Preference: Explanation, Demonstration Highest Education Level: High School Preferred Language: English Cognitive Barrier Assessment/Beliefs Language Barrier: No Translator Needed: No Memory Deficit: No Emotional Barrier: No Cultural/Religious Beliefs Affecting Medical No Care: Physical Barrier Assessment Impaired Vision: No Impaired Hearing: No Decreased Hand dexterity: No Knowledge/Comprehension Assessment Knowledge Level: High Comprehension Level: High Ability to understand written High instructions: Ability to understand verbal High instructions: Motivation Assessment Anxiety Level: Calm Cooperation: Cooperative Education Importance: Acknowledges Need Interest in Health Problems: Asks Questions Perception: Coherent Willingness to Engage in Self- High Management Activities: Readiness to Engage in Self- High Management Activities: Electronic Signature(s) [name] ([id number]) Signed: 12/01/2014 4:10:23 PM By: Gregory Cool, RN, BSN, Kim RN, BSN Previous Signature: 11/26/2014 4:59:44 PM  Version By: Gregory Cool RN, BSN, Kim RN, BSN Entered By: Gregory Cool, RN, BSN, Kim on 12/01/2014 16:08:07 [name] ([id number]) -------------------------------------------------------------------------------- Fall Risk Assessment Details Patient Name: [name] Date of Service: 11/26/2014 8:45 AM Medical Record Number: [id number] Patient Account Number: [id number] Date of Birth/Sex: [birth year] (54 y.o. Male) Treating RN: [name] Primary Care Physician: PATIENT, NO Other Clinician: Referring Physician: CLINE, TODD Treating Physician/Extender: Frann Rider in Treatment: 0 Fall Risk Assessment Items FALL RISK ASSESSMENT: History of falling - immediate or within 3 months 25 Yes Secondary diagnosis 0 No Ambulatory aid None/bed  rest/wheelchair/nurse 0 Yes Crutches/cane/walker 0 No Furniture 0 No IV Access/Saline Lock 0 No Gait/Training Normal/bed rest/immobile 0 Yes Weak 0 No Impaired 0 No Mental Status Oriented to own ability 0 Yes Electronic Signature(s) Signed: 12/01/2014 4:10:23 PM By: Gregory Cool, RN, BSN, Kim RN, BSN Previous Signature: 11/26/2014 4:59:44 PM Version By: Gregory Cool, RN, BSN, Kim RN, BSN Entered By: Gregory Cool, RN, BSN, Kim on 12/01/2014 16:08:15 [name] ([id number]) -------------------------------------------------------------------------------- Foot Assessment Details Patient Name: [name] Date of Service: 11/26/2014 8:45 AM Medical Record Number: [id number] Patient Account Number: [id number] Date of Birth/Sex: 03-27-1960 (54 y.o. Male) Treating RN: [name] Primary Care Physician: PATIENT, NO Other Clinician: Referring Physician: CLINE, TODD Treating Physician/Extender: Frann Rider in Treatment: 0 Foot Assessment Items Site Locations + = Sensation present, - = Sensation absent, C = Callus, U = Ulcer R = Redness, W = Warmth, M = Maceration, PU = Pre-ulcerative lesion F = Fissure, S = Swelling, D = Dryness Assessment Right: Left: Other  Deformity: No No Prior Foot Ulcer: No No Prior Amputation: Yes No Charcot Joint: No No Ambulatory Status: Ambulatory Without Help Gait: Steady Electronic Signature(s) Signed: 12/01/2014 4:10:23 PM By: Gregory Cool, RN, BSN, Kim RN, BSN Previous Signature: 11/26/2014 4:59:44 PM Version By: Gregory Cool, RN, BSN, Kim RN, BSN Entered By: Gregory Cool, RN, BSN, Kim on 12/01/2014 16:08:31 [name] ([id number]) -------------------------------------------------------------------------------- Nutrition Risk Assessment Details Patient Name: [name] Date of Service: 11/26/2014 8:45 AM Medical Record Number: [id number] Patient Account Number: [id number] Date of Birth/Sex: [birth year] (54 y.o. Male) Treating RN: [name] Primary Care Physician: PATIENT, NO Other Clinician: Referring Physician: CLINE, TODD Treating Physician/Extender: Frann Rider in Treatment: 0 Height (in): 67 Weight (lbs): 165 Body Mass Index (BMI): 25.8 Nutrition Risk Assessment Items NUTRITION RISK SCREEN: I have an illness or condition that made me change the kind and/or 0 No amount of food I eat I eat fewer than two meals per day 0 No I eat few fruits and vegetables, or milk products 0 No I have three or more drinks of beer, liquor or wine almost every day 0 No I have tooth or mouth problems that make it hard for me to eat 0 No I don't always have enough money to buy the food I need 0 No I eat alone most of the time 0 No I take three or more different prescribed or over-the-counter drugs a 0 No day Without wanting to, I have lost or gained 10 pounds in the last six 0 No months I am not always physically able to shop, cook and/or feed myself 0 No Nutrition Protocols Good Risk Protocol 0 No interventions needed Moderate Risk Protocol Electronic Signature(s) Signed: 12/01/2014 4:10:23 PM By: Gregory Cool, RN, BSN, Kim RN, BSN Previous Signature: 11/26/2014 4:59:44 PM Version By: Gregory Cool, RN, BSN, Kim RN, BSN Entered By:  Gregory Cool, RN, BSN, Kim on 12/01/2014 [id number]

## 2014-12-03 ENCOUNTER — Encounter: Payer: No Typology Code available for payment source | Admitting: Surgery

## 2014-12-03 DIAGNOSIS — E11621 Type 2 diabetes mellitus with foot ulcer: Secondary | ICD-10-CM | POA: Diagnosis not present

## 2014-12-03 NOTE — Progress Notes (Signed)
AMASA, MCKECHNIE ([id number]) Visit Report for 12/03/2014 Arrival Information Details Patient Name: [name] Date of Service: 12/03/2014 9:00 AM Medical Record Number: [id number] Patient Account Number: [id number] Date of Birth/Sex: 1960-07-23 (54 y.o. Male) Treating RN: Cornell Barman Primary Care Physician: PATIENT, NO Other Clinician: Referring Physician: CLINE, TODD Treating Physician/Extender: Frann Rider in Treatment: 1 Visit Information History Since Last Visit Added or deleted any medications: No Patient Arrived: Ambulatory Any new allergies or adverse reactions: No Arrival Time: 09:12 Had a fall or experienced change in No Accompanied By: self activities of daily living that may affect Transfer Assistance: None risk of falls: Patient Identification Verified: Yes Signs or symptoms of abuse/neglect since last No Secondary Verification Process Yes visito Completed: Hospitalized since last visit: No Patient Has Alerts: Yes Has Dressing in Place as Prescribed: Yes Patient Alerts: Type II Has Footwear/Offloading in Place as Yes Diabetic Prescribed: Right: Wedge Shoe Pain Present Now: No Electronic Signature(s) Signed: 12/03/2014 11:56:10 AM By: Gretta Cool, RN, BSN, Kim RN, BSN Entered By: Gretta Cool, RN, BSN, Kim on 12/03/2014 09:13:25 [name] ([id number]) -------------------------------------------------------------------------------- Clinic Level of Care Assessment Details Patient Name: [name] Date of Service: 12/03/2014 9:00 AM Medical Record Number: [id number] Patient Account Number: [id number] Date of Birth/Sex: 07/29/1960 (54 y.o. Male) Treating RN: Cornell Barman Primary Care Physician: PATIENT, NO Other Clinician: Referring Physician: CLINE, TODD Treating Physician/Extender: Frann Rider in Treatment: 1 Clinic Level of Care Assessment Items TOOL 4 Quantity Score []  - Use when only an EandM is performed on FOLLOW-UP visit 0 ASSESSMENTS  - Nursing Assessment / Reassessment []  - Reassessment of Co-morbidities (includes updates in patient status) 0 X - Reassessment of Adherence to Treatment Plan 1 5 ASSESSMENTS - Wound and Skin Assessment / Reassessment X - Simple Wound Assessment / Reassessment - one wound 1 5 []  - Complex Wound Assessment / Reassessment - multiple wounds 0 []  - Dermatologic / Skin Assessment (not related to wound area) 0 ASSESSMENTS - Focused Assessment []  - Circumferential Edema Measurements - multi extremities 0 []  - Nutritional Assessment / Counseling / Intervention 0 []  - Lower Extremity Assessment (monofilament, tuning fork, pulses) 0 []  - Peripheral Arterial Disease Assessment (using hand held doppler) 0 ASSESSMENTS - Ostomy and/or Continence Assessment and Care []  - Incontinence Assessment and Management 0 []  - Ostomy Care Assessment and Management (repouching, etc.) 0 PROCESS - Coordination of Care X - Simple Patient / Family Education for ongoing care 1 15 []  - Complex (extensive) Patient / Family Education for ongoing care 0 []  - Staff obtains Programmer, systems, Records, Test Results / Process Orders 0 []  - Staff telephones HHA, Nursing Homes / Clarify orders / etc 0 []  - Routine Transfer to another Facility (non-emergent condition) 0 HARTSELL, Ibrahem ([id number]) []  - Routine Hospital Admission (non-emergent condition) 0 []  - New Admissions / Biomedical engineer / Ordering NPWT, Apligraf, etc. 0 []  - Emergency Hospital Admission (emergent condition) 0 X - Simple Discharge Coordination 1 10 []  - Complex (extensive) Discharge Coordination 0 PROCESS - Special Needs []  - Pediatric / Minor Patient Management 0 []  - Isolation Patient Management 0 []  - Hearing / Language / Visual special needs 0 []  - Assessment of Community assistance (transportation, D/C planning, etc.) 0 []  - Additional assistance / Altered mentation 0 []  - Support Surface(s) Assessment (bed, cushion, seat, etc.) 0 INTERVENTIONS -  Wound Cleansing / Measurement X - Simple Wound Cleansing - one wound 1 5 []  - Complex Wound Cleansing - multiple wounds 0 X - Wound Imaging (photographs -  any number of wounds) 1 5 []  - Wound Tracing (instead of photographs) 0 X - Simple Wound Measurement - one wound 1 5 []  - Complex Wound Measurement - multiple wounds 0 INTERVENTIONS - Wound Dressings X - Small Wound Dressing one or multiple wounds 1 10 []  - Medium Wound Dressing one or multiple wounds 0 []  - Large Wound Dressing one or multiple wounds 0 []  - Application of Medications - topical 0 []  - Application of Medications - injection 0 INTERVENTIONS - Miscellaneous []  - External ear exam 0 Maiolo, Lev ([id number]) []  - Specimen Collection (cultures, biopsies, blood, body fluids, etc.) 0 []  - Specimen(s) / Culture(s) sent or taken to Lab for analysis 0 []  - Patient Transfer (multiple staff / Harrel Lemon Lift / Similar devices) 0 []  - Simple Staple / Suture removal (25 or less) 0 []  - Complex Staple / Suture removal (26 or more) 0 []  - Hypo / Hyperglycemic Management (close monitor of Blood Glucose) 0 []  - Ankle / Brachial Index (ABI) - do not check if billed separately 0 X - Vital Signs 1 5 Has the patient been seen at the hospital within the last three years: Yes Total Score: 65 Level Of Care: New/Established - Level 2 Electronic Signature(s) Signed: 12/03/2014 11:56:10 AM By: Gretta Cool, RN, BSN, Kim RN, BSN Entered By: Gretta Cool, RN, BSN, Kim on 12/03/2014 09:25:09 [name] ([id number]) -------------------------------------------------------------------------------- Encounter Discharge Information Details Patient Name: [name] Date of Service: 12/03/2014 9:00 AM Medical Record Number: [id number] Patient Account Number: [id number] Date of Birth/Sex: Mar 05, 1960 (54 y.o. Male) Treating RN: Cornell Barman Primary Care Physician: PATIENT, NO Other Clinician: Referring Physician: CLINE, TODD Treating Physician/Extender:  Frann Rider in Treatment: 1 Encounter Discharge Information Items Discharge Pain Level: 0 Discharge Condition: Stable Ambulatory Status: Ambulatory Discharge Destination: Home Transportation: Private Auto Accompanied By: self Schedule Follow-up Appointment: No Medication Reconciliation completed and provided to Patient/Care No Nayelli Inglis: Provided on Clinical Summary of Care: 12/03/2014 Form Type Recipient Paper Patient PC Electronic Signature(s) Signed: 12/03/2014 11:56:10 AM By: Gretta Cool, RN, BSN, Kim RN, BSN Previous Signature: 12/03/2014 9:25:40 AM Version By: Ruthine Dose Entered By: Gretta Cool RN, BSN, Kim on 12/03/2014 09:26:07 [name] ([id number]) -------------------------------------------------------------------------------- Lower Extremity Assessment Details Patient Name: [name] Date of Service: 12/03/2014 9:00 AM Medical Record Number: [id number] Patient Account Number: [id number] Date of Birth/Sex: January 27, 1960 (54 y.o. Male) Treating RN: Cornell Barman Primary Care Physician: PATIENT, NO Other Clinician: Referring Physician: CLINE, TODD Treating Physician/Extender: Frann Rider in Treatment: 1 Vascular Assessment Pulses: Posterior Tibial Dorsalis Pedis Palpable: [Right:Yes] Extremity colors, hair growth, and conditions: Extremity Color: [Right:Normal] Hair Growth on Extremity: [Right:Yes] Temperature of Extremity: [Right:Warm] Capillary Refill: [Right:< 3 seconds] Toe Nail Assessment Left: Right: Thick: No Discolored: No Deformed: No Improper Length and Hygiene: No Electronic Signature(s) Signed: 12/03/2014 11:56:10 AM By: Gretta Cool, RN, BSN, Kim RN, BSN Entered By: Gretta Cool, RN, BSN, Kim on 12/03/2014 [place], [place] ([id number]) -------------------------------------------------------------------------------- Multi Wound Chart Details Patient Name: [name] Date of Service: 12/03/2014 9:00 AM Medical Record Number:  [id number] Patient Account Number: [id number] Date of Birth/Sex: [birth year] (54 y.o. Male) Treating RN: Cornell Barman Primary Care Physician: PATIENT, NO Other Clinician: Referring Physician: CLINE, TODD Treating Physician/Extender: Frann Rider in Treatment: 1 Vital Signs Height(in): 67 Pulse(bpm): 76 Weight(lbs): 165 Blood Pressure 129/72 (mmHg): Body Mass Index(BMI): 26 Temperature(F): 97.6 Respiratory Rate 16 (breaths/min): Photos: [1:No Photos] [N/A:N/A] Wound Location: [1:Right Toe Great] [N/A:N/A] Wounding Event: [1:Surgical Injury] [N/A:N/A] Primary Etiology: [1:Acute Osteomyelitis] [N/A:N/A] Comorbid History: [1:Peripheral Venous  Disease, Type II Diabetes, End Stage Renal Disease, Osteoarthritis, Neuropathy] [N/A:N/A] Date Acquired: [1:10/31/2014] [N/A:N/A] Weeks of Treatment: [1:1] [N/A:N/A] Wound Status: [1:Open] [N/A:N/A] Measurements L x W x D 3x1.8x1.2 [N/A:N/A] (cm) Area (cm) : [1:4.241] [N/A:N/A] Volume (cm) : [1:5.089] [N/A:N/A] % Reduction in Area: [1:15.60%] [N/A:N/A] % Reduction in Volume: 27.70% [N/A:N/A] Classification: [1:Full Thickness Without Exposed Support Structures] [N/A:N/A] HBO Classification: [1:Grade 3] [N/A:N/A] Wagner Verification: [1:MRI] [N/A:N/A] Exudate Amount: [1:Large] [N/A:N/A] Exudate Type: [1:Serosanguineous] [N/A:N/A] Exudate Color: [1:red, brown] [N/A:N/A] Wound Margin: [1:Flat and Intact] [N/A:N/A] Granulation Amount: [1:Small (1-33%)] [N/A:N/A] Granulation Quality: [1:Red] [N/A:N/A] Necrotic Amount: [1:Large (67-100%)] [N/A:N/A] Exposed Structures: [N/A:N/A] Fascia: No Fat: No Tendon: No Muscle: No Joint: No Bone: No Limited to Skin Breakdown Periwound Skin Texture: Edema: No N/A N/A Excoriation: No Induration: No Callus: No Crepitus: No Fluctuance: No Friable: No Rash: No Scarring: No Periwound Skin Moist: Yes N/A N/A Moisture: Maceration: No Dry/Scaly: No Periwound Skin Color: Atrophie Blanche:  No N/A N/A Cyanosis: No Ecchymosis: No Erythema: No Hemosiderin Staining: No Mottled: No Pallor: No Rubor: No Tenderness on No N/A N/A Palpation: Wound Preparation: Ulcer Cleansing: N/A N/A Rinsed/Irrigated with Saline Topical Anesthetic Applied: None Treatment Notes Electronic Signature(s) Signed: 12/03/2014 11:56:10 AM By: Gretta Cool, RN, BSN, Kim RN, BSN Entered By: Gretta Cool, RN, BSN, Kim on 12/03/2014 09:20:02 [name] ([id number]) -------------------------------------------------------------------------------- [place] Details Patient Name: [name] Date of Service: 12/03/2014 9:00 AM Medical Record Number: [id number] Patient Account Number: [id number] Date of Birth/Sex: 1960/09/30 (54 y.o. Male) Treating RN: Cornell Barman Primary Care Physician: PATIENT, NO Other Clinician: Referring Physician: CLINE, TODD Treating Physician/Extender: Frann Rider in Treatment: 1 Active Inactive Electronic Signature(s) Signed: 12/03/2014 11:56:10 AM By: Gretta Cool, RN, BSN, Kim RN, BSN Entered By: Gretta Cool, RN, BSN, Kim on 12/03/2014 09:19:54 [name] ([id number]) -------------------------------------------------------------------------------- Pain Assessment Details Patient Name: [name] Date of Service: 12/03/2014 9:00 AM Medical Record Number: [id number] Patient Account Number: [id number] Date of Birth/Sex: 20-Aug-1960 (54 y.o. Male) Treating RN: Cornell Barman Primary Care Physician: PATIENT, NO Other Clinician: Referring Physician: CLINE, TODD Treating Physician/Extender: Frann Rider in Treatment: 1 Active Problems Location of Pain Severity and Description of Pain Patient Has Paino No Site Locations Pain Management and Medication Current Pain Management: Electronic Signature(s) Signed: 12/03/2014 11:56:10 AM By: Gretta Cool, RN, BSN, Kim RN, BSN Entered By: Gretta Cool, RN, BSN, Kim on 12/03/2014 09:13:30 [name]  ([id number]) -------------------------------------------------------------------------------- Patient/Caregiver Education Details Patient Name: [name] Date of Service: 12/03/2014 9:00 AM Medical Record Number: [id number] Patient Account Number: [id number] Date of Birth/Gender: October 18, 1960 (54 y.o. Male) Treating RN: Cornell Barman Primary Care Physician: PATIENT, NO Other Clinician: Referring Physician: CLINE, TODD Treating Physician/Extender: Frann Rider in Treatment: 1 Education Assessment Education Provided To: Patient Education Topics Provided Wound/Skin Impairment: Handouts: Caring for Your Ulcer, Other: transfer care back to podiatry Methods: Demonstration, Explain/Verbal Responses: State content correctly Electronic Signature(s) Signed: 12/03/2014 11:56:10 AM By: Gretta Cool, RN, BSN, Kim RN, BSN Entered By: Gretta Cool, RN, BSN, Kim on 12/03/2014 09:26:43 [name] ([id number]) -------------------------------------------------------------------------------- Wound Assessment Details Patient Name: [name] Date of Service: 12/03/2014 9:00 AM Medical Record Number: [id number] Patient Account Number: [id number] Date of Birth/Sex: Jan 24, 1960 (54 y.o. Male) Treating RN: Cornell Barman Primary Care Physician: PATIENT, NO Other Clinician: Referring Physician: CLINE, TODD Treating Physician/Extender: Frann Rider in Treatment: 1 Wound Status Wound Number: 1 Primary Acute Osteomyelitis Etiology: Wound Location: Right Toe Great Wound Open Wounding Event: Surgical Injury Status: Date Acquired: 10/31/2014 Comorbid Peripheral Venous Disease, Type II Weeks Of Treatment:  1 History: Diabetes, End Stage Renal Disease, Clustered Wound: No Osteoarthritis, Neuropathy Photos Photo Uploaded By: Gretta Cool, RN, BSN, Kim on 12/03/2014 11:49:32 Wound Measurements Length: (cm) 3 Width: (cm) 1.8 Depth: (cm) 1.2 Area: (cm) 4.241 Volume: (cm) 5.089 % Reduction in Area:  15.6% % Reduction in Volume: 27.7% Wound Description Full Thickness Without Exposed Classification: Support Structures Diabetic Severity Grade 3 (Wagner): Earleen Newport Verification: MRI Wound Margin: Flat and Intact Exudate Amount: Large Exudate Type: Serosanguineous Exudate Color: red, brown Wound Bed JACOBEY, BURTNETT ([id number]) Granulation Amount: Small (1-33%) Exposed Structure Granulation Quality: Red Fascia Exposed: No Necrotic Amount: Large (67-100%) Fat Layer Exposed: No Necrotic Quality: Adherent Slough Tendon Exposed: No Muscle Exposed: No Joint Exposed: No Bone Exposed: No Limited to Skin Breakdown Periwound Skin Texture Texture Color No Abnormalities Noted: No No Abnormalities Noted: No Callus: No Atrophie Blanche: No Crepitus: No Cyanosis: No Excoriation: No Ecchymosis: No Fluctuance: No Erythema: No Friable: No Hemosiderin Staining: No Induration: No Mottled: No Localized Edema: No Pallor: No Rash: No Rubor: No Scarring: No Moisture No Abnormalities Noted: No Dry / Scaly: No Maceration: No Moist: Yes Wound Preparation Ulcer Cleansing: Rinsed/Irrigated with Saline Topical Anesthetic Applied: None Treatment Notes Wound #1 (Right Toe Great) 1. Cleansed with: Clean wound with Normal Saline 4. Dressing Applied: Saline moistened guaze 5. Secondary Dressing Applied Gauze and Kerlix/Conform Notes ace wrap; surgical shoe Electronic Signature(s) Signed: 12/03/2014 11:56:10 AM By: Gretta Cool, RN, BSN, Kim RN, BSN Entered By: Gretta Cool, RN, BSN, Kim on 12/03/2014 09:15:38 JHAYDEN, KUTCHER ([id number]) MARKANTHONY, LOCKLAR ([id number]) -------------------------------------------------------------------------------- [place] Details Patient Name: [name] Date of Service: 12/03/2014 9:00 AM Medical Record Number: [id number] Patient Account Number: [id number] Date of Birth/Sex: September 20, 1960 (54 y.o. Male) Treating RN: Cornell Barman Primary Care Physician: PATIENT,  NO Other Clinician: Referring Physician: CLINE, TODD Treating Physician/Extender: Frann Rider in Treatment: 1 Vital Signs Time Taken: 09:13 Temperature (F): 97.6 Height (in): 67 Pulse (bpm): 76 Weight (lbs): 165 Respiratory Rate (breaths/min): 16 Body Mass Index (BMI): 25.8 Blood Pressure (mmHg): 129/72 Reference Range: 80 - 120 mg / dl Electronic Signature(s) Signed: 12/03/2014 11:56:10 AM By: Gretta Cool, RN, BSN, Kim RN, BSN Entered By: Gretta Cool, RN, BSN, Kim on 12/03/2014 09:13:48

## 2014-12-04 NOTE — Progress Notes (Signed)
Gregory, [name] ([id number]) Visit Report for 12/03/2014 Chief Complaint Document Details Patient Name: [name], [name] Date of Service: 12/03/2014 9:00 AM Medical Record Number: [id number] Patient Account Number: [id number] Date of Birth/Sex: 23-Oct-1960 (54 y.o. Male) Treating RN: Cornell Barman Primary Care Physician: PATIENT, NO Other Clinician: Referring Physician: CLINE, TODD Treating Physician/Extender: Frann Rider in Treatment: 1 Information Obtained from: Patient Chief Complaint Patient in today for treatment of non-healing wound and HBO Treatment. This patient has had an recent surgery for gangrene and osteomyelitis of his right big toe with postoperative flap teased since and residual osteomyelitis for the last 3 weeks. Electronic Signature(s) Signed: 12/03/2014 9:51:51 AM By: Christin Fudge MD, FACS Entered By: Christin Fudge on 12/03/2014 09:51:51 DAMIEL, WORTHEY ([id number]) -------------------------------------------------------------------------------- HPI Details Patient Name: [name] Date of Service: 12/03/2014 9:00 AM Medical Record Number: [id number] Patient Account Number: [id number] Date of Birth/Sex: Jun 17, 1960 (54 y.o. Male) Treating RN: Cornell Barman Primary Care Physician: PATIENT, NO Other Clinician: Referring Physician: CLINE, TODD Treating Physician/Extender: Frann Rider in Treatment: 1 History of Present Illness Location: open wound at the right first metatarsal region Quality: Patient reports No Pain. Severity: Patient states wound are getting worse. Duration: Patient has had the wound for < 3 weeks prior to presenting for treatment Context: The wound appeared gradually over time Modifying Factors: Other treatment(s) tried include:has had angioplasty and amputation of his right big toe Associated Signs and Symptoms: Patient reports having difficulty standing for long periods. HPI Description: 54 year old gentleman,with a known history  of diabetes and not on any medication for the past 1 year due to noncompliance, heavy tobacco abuse, initially presented with a blister at the bottom of his right great toe about 2 weeks duration and this was noted in early October.The patient had a gangrenous right toe and was found to have poor blood flow for amputation was taken up by Dr. Lucky Cowboy for angiography and this was done on 11/02/2014. He had a successful angioplasty of the right posterior tibial artery and the right popliteal artery with appropriate angiograms. prior to that on 10/31/2014 Dr. Cleda Mccreedy from podiatry had taken up for surgery for gangrene with osteomyelitis of the right great toe. this was done through the MP joint, and sutured closed. his workup prior to surgery had indicated that he had no DVT of the right lower extremity and no venous reflex. A plain x-ray of the right foot confirmed air in the region of the right great toe suggesting gangrene and there was osteomyelitis of the right first metatarsal consistent with osteomyelitis. he was discharged home on 11/03/2014 with oral Augmentin for 2 weeks, oral medications for his diabetes mellitus and his last hemoglobin A1c was 7.7. Of note the final pathology specimen showed osteomyelitis and soft tissue necrosis are close to the proximal margin of the specimen. 12/03/2014 -- the patient comes to discuss the management of his right diabetic ulcer with respect to hyperbaric oxygen therapy. His wound care has been taken care of by his podiatrist Dr. Cleda Mccreedy. Electronic Signature(s) Signed: 12/03/2014 9:52:54 AM By: Christin Fudge MD, FACS Entered By: Christin Fudge on 12/03/2014 09:52:54 DEX, GANSCHOW ([id number]) -------------------------------------------------------------------------------- Physical Exam Details Patient Name: [name] Date of Service: 12/03/2014 9:00 AM Medical Record Number: [id number] Patient Account Number: [id number] Date of Birth/Sex: 1960-04-21  (54 y.o. Male) Treating RN: Cornell Barman Primary Care Physician: PATIENT, NO Other Clinician: Referring Physician: CLINE, TODD Treating Physician/Extender: Frann Rider in Treatment: 1 Constitutional . Pulse regular. Respirations normal and unlabored. Afebrile. . Eyes Nonicteric.  Reactive to light. Ears, Nose, Mouth, and Throat Lips, teeth, and gums WNL.Marland Kitchen Moist mucosa without lesions . Neck supple and nontender. No palpable supraclavicular or cervical adenopathy. Normal sized without goiter. Respiratory WNL. No retractions.. Cardiovascular Pedal Pulses WNL. No clubbing, cyanosis or edema. Lymphatic No adneopathy. No adenopathy. No adenopathy. Musculoskeletal Adexa without tenderness or enlargement.. Digits and nails w/o clubbing, cyanosis, infection, petechiae, ischemia, or inflammatory conditions.. Integumentary (Hair, Skin) No suspicious lesions. No crepitus or fluctuance. No peri-wound warmth or erythema. No masses.Marland Kitchen Psychiatric Judgement and insight Intact.. No evidence of depression, anxiety, or agitation.. Notes the large open wound at the base of his first metatarsal continues to have some soft tissue debris and slough and no evidence of purulence or cellulitis. Electronic Signature(s) Signed: 12/03/2014 9:53:28 AM By: Christin Fudge MD, FACS Entered By: Christin Fudge on 12/03/2014 09:53:28 [name] ([id number]) -------------------------------------------------------------------------------- Physician Orders Details Patient Name: [name] Date of Service: 12/03/2014 9:00 AM Medical Record Number: [id number] Patient Account Number: [id number] Date of Birth/Sex: 11-Oct-1960 (54 y.o. Male) Treating RN: Cornell Barman Primary Care Physician: PATIENT, NO Other Clinician: Referring Physician: CLINE, TODD Treating Physician/Extender: Frann Rider in Treatment: 1 Verbal / Phone Orders: Yes Clinician: Cornell Barman Read Back and Verified: Yes Diagnosis  Coding Primary Wound Dressing Wound #1 Right Toe Great o Saline moistened gauze Secondary Dressing Wound #1 Right Toe Great o Conform/Kerlix Discharge From [facility] Wound #1 Right Toe Great o Discharge from [place] back to podiatry Electronic Signature(s) Signed: 12/03/2014 11:56:10 AM By: Gretta Cool, RN, BSN, Kim RN, BSN Signed: 12/03/2014 4:04:17 PM By: Christin Fudge MD, FACS Entered By: Gretta Cool, RN, BSN, Kim on 12/03/2014 09:24:48 [name] ([id number]) -------------------------------------------------------------------------------- Problem List Details Patient Name: [name] Date of Service: 12/03/2014 9:00 AM Medical Record Number: [id number] Patient Account Number: [id number] Date of Birth/Sex: Apr 16, 1960 (54 y.o. 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 M86.671 Other chronic osteomyelitis, right ankle and foot 11/26/2014 Yes T87.81 Dehiscence of amputation stump 11/26/2014 Yes T81.31XA Disruption of external operation (surgical) wound, not 11/26/2014 Yes elsewhere classified, initial encounter Inactive Problems Resolved Problems Electronic Signature(s) Signed: 12/03/2014 9:51:45 AM By: Christin Fudge MD, FACS Entered By: Christin Fudge on 12/03/2014 09:51:45 JAKIEM, SCOGGINS ([id number]) Vicente Serene, Saralyn Pilar  ([id number]) -------------------------------------------------------------------------------- Progress Note Details Patient Name: [name] Date of Service: 12/03/2014 9:00 AM Medical Record Number: [id number] Patient Account Number: [id number] Date of Birth/Sex: 1960/02/28 (54 y.o. Male) Treating RN: Cornell Barman Primary Care Physician: PATIENT, NO Other Clinician: Referring Physician: CLINE, TODD Treating Physician/Extender: Frann Rider in Treatment: 1 Subjective Chief Complaint Information obtained from Patient Patient in today for treatment of non-healing wound and HBO Treatment. This patient has had an recent surgery for gangrene and osteomyelitis of his right big toe with postoperative flap teased since and residual osteomyelitis for the last 3 weeks. History of Present Illness (HPI) The following HPI elements were documented for the patient's wound: Location: open wound at the right first metatarsal region Quality: Patient reports No Pain. Severity: Patient states wound are getting  worse. Duration: Patient has had the wound for < 3 weeks prior to presenting for treatment Context: The wound appeared gradually over time Modifying Factors: Other treatment(s) tried include:has had angioplasty and amputation of his right big toe Associated Signs and Symptoms: Patient reports having difficulty standing for long periods. 54 year old gentleman,with a known history of diabetes and not on any medication for the past 1 year due to noncompliance, heavy tobacco abuse, initially presented with a blister at the bottom of his right great toe about 2 weeks duration and this was noted in early October.The patient had a gangrenous right toe and was found to have poor blood flow for amputation was taken up by Dr. Lucky Cowboy for angiography and this was done on 11/02/2014. He had a successful angioplasty of the right posterior tibial artery and the right popliteal artery with appropriate  angiograms. prior to that on 10/31/2014 Dr. Cleda Mccreedy from podiatry had taken up for surgery for gangrene with osteomyelitis of the right great toe. this was done through the MP joint, and sutured closed. his workup prior to surgery had indicated that he had no DVT of the right lower extremity and no venous reflex. A plain x-ray of the right foot confirmed air in the region of the right great toe suggesting gangrene and there was osteomyelitis of the right first metatarsal consistent with osteomyelitis. he was discharged home on 11/03/2014 with oral Augmentin for 2 weeks, oral medications for his diabetes mellitus and his last hemoglobin A1c was 7.7. Of note the final pathology specimen showed osteomyelitis and soft tissue necrosis are close to the proximal margin of the specimen. 12/03/2014 -- the patient comes to discuss the management of his right diabetic ulcer with respect to hyperbaric oxygen therapy. His wound care has been taken care of by his podiatrist Dr. Cleda Mccreedy. HAROUN, COLLA ([id number]) Objective Constitutional Pulse regular. Respirations normal and unlabored. Afebrile. Vitals Time Taken: 9:13 AM, Height: 67 in, Weight: 165 lbs, BMI: 25.8, Temperature: 97.6 F, Pulse: 76 bpm, Respiratory Rate: 16 breaths/min, Blood Pressure: 129/72 mmHg. Eyes Nonicteric. Reactive to light. Ears, Nose, Mouth, and Throat Lips, teeth, and gums WNL.Marland Kitchen Moist mucosa without lesions . Neck supple and nontender. No palpable supraclavicular or cervical adenopathy. Normal sized without goiter. Respiratory WNL. No retractions.. Cardiovascular Pedal Pulses WNL. No clubbing, cyanosis or edema. Lymphatic No adneopathy. No adenopathy. No adenopathy. Musculoskeletal Adexa without tenderness or enlargement.. Digits and nails w/o clubbing, cyanosis, infection, petechiae, ischemia, or inflammatory conditions.Marland Kitchen Psychiatric Judgement and insight Intact.. No evidence of depression, anxiety, or  agitation.. General Notes: the large open wound at the base of his first metatarsal continues to have some soft tissue debris and slough and no evidence of purulence or cellulitis. Integumentary (Hair, Skin) No suspicious lesions. No crepitus or fluctuance. No peri-wound warmth or erythema. No masses.. Wound #1 status is Open. Original cause of wound was Surgical Injury. The wound is located on the Right Toe Great. The wound measures 3cm length x 1.8cm width x 1.2cm depth; 4.241cm^2 area and 5.089cm^3 volume. The wound is limited to skin breakdown. There is a large amount of serosanguineous drainage noted. The wound margin is flat and intact. There is small (1-33%) red granulation within the wound bed. There is a large (67-100%) amount of necrotic tissue within the wound bed including Adherent Slough. The periwound skin appearance exhibited: Moist. The periwound skin appearance did not exhibit: Callus, Macchi, Anterio ([id number]) Crepitus, Excoriation, Fluctuance, Friable, Induration, Localized Edema, Rash, Scarring, Dry/Scaly, Maceration, Atrophie Blanche, Cyanosis, Ecchymosis, Hemosiderin  Staining, Mottled, Pallor, Rubor, Erythema. 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 The patient declines to undergo therapy with hyperbaric oxygen treatment as the financial commitment is beyond the scope of his means. As far as his wound on the right foot goes, this is being taken care of by his podiatrist Dr. Cleda Mccreedy. I have deferred management of this wound to Dr. Cleda Mccreedy, who is been seeing him regularly. At any stage if it is felt that he needs our help for wound care, we would be happy to see him back in the wound care clinic. Plan Primary Wound Dressing: Wound #1 Right Toe Great: Saline moistened gauze Secondary Dressing: Wound #1 Right Toe Great: Conform/Kerlix Discharge From [facility]: Wound #1 Right Toe Great: Discharge from [place] back to podiatry TAJMIR, GLOMB ([id number]) Though I highly recommended hyperbaric oxygen therapy and my staff has been working with his insurance company, I understand that he is not going to be covered for a major part of the cost and he will have a high copayment. The patient declines to undergo therapy with hyperbaric oxygen treatment as the financial commitment is beyond the scope of his means. As far as his wound on the right foot goes, this is being taken care of by his podiatrist Dr. Cleda Mccreedy. I have deferred management of this wound to Dr. Cleda Mccreedy, who is been seeing him regularly. At any stage if it is felt that he needs our help for wound care, we would be happy to see him back in the wound care clinic. Electronic Signature(s) Signed: 12/03/2014 9:56:17 AM By: Christin Fudge MD, FACS Entered By: Christin Fudge on 12/03/2014 09:56:17 DAYVION, SLUIS ([id number]) -------------------------------------------------------------------------------- SuperBill Details Patient Name: [name] Date of Service: 12/03/2014 Medical Record Number: [id number] Patient Account Number: [id number] Date of Birth/Sex: 04/16/1960 (54 y.o. 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 M86.671 Other chronic osteomyelitis, right ankle and foot T87.81 Dehiscence of amputation stump Disruption of external operation (surgical) wound, not elsewhere classified, initial T81.31XA encounter Facility Procedures CPT4 Code: [id number] Description: [id number] - WOUND CARE VISIT-LEV 2 EST PT Modifier: Quantity: 1 Physician Procedures CPT4: Description Modifier Quantity Code [id number] - WC PHYS LEVEL 3 - EST PT 1 ICD-10 Description Diagnosis E11.621 Type 2 diabetes mellitus with foot ulcer T81.31XA Disruption of external operation (surgical) wound, not elsewhere classified,  initial encounter T87.81 Dehiscence of amputation stump M86.671 Other chronic osteomyelitis, right ankle  and foot Electronic Signature(s) Signed: 12/03/2014 9:56:35 AM By: Christin Fudge MD, FACS Entered By: Christin Fudge on 12/03/2014 09:56:35

## 2015-03-25 DIAGNOSIS — G629 Polyneuropathy, unspecified: Secondary | ICD-10-CM | POA: Insufficient documentation

## 2018-01-23 DIAGNOSIS — I639 Cerebral infarction, unspecified: Secondary | ICD-10-CM

## 2018-01-23 HISTORY — DX: Cerebral infarction, unspecified: I63.9

## 2018-01-26 ENCOUNTER — Inpatient Hospital Stay (HOSPITAL_COMMUNITY)
Admission: EM | Admit: 2018-01-26 | Discharge: 2018-01-28 | DRG: 066 | Disposition: A | Discharge status: Home / Self Care | Payer: BLUE CROSS/BLUE SHIELD | Attending: Family Medicine | Admitting: Family Medicine

## 2018-01-26 ENCOUNTER — Encounter (HOSPITAL_COMMUNITY): Payer: Self-pay

## 2018-01-26 ENCOUNTER — Emergency Department (HOSPITAL_COMMUNITY): Payer: BLUE CROSS/BLUE SHIELD

## 2018-01-26 ENCOUNTER — Other Ambulatory Visit: Payer: Self-pay

## 2018-01-26 DIAGNOSIS — R270 Ataxia, unspecified: Secondary | ICD-10-CM | POA: Diagnosis not present

## 2018-01-26 DIAGNOSIS — I160 Hypertensive urgency: Secondary | ICD-10-CM | POA: Diagnosis not present

## 2018-01-26 DIAGNOSIS — Z23 Encounter for immunization: Secondary | ICD-10-CM

## 2018-01-26 DIAGNOSIS — Z7984 Long term (current) use of oral hypoglycemic drugs: Secondary | ICD-10-CM

## 2018-01-26 DIAGNOSIS — Z89421 Acquired absence of other right toe(s): Secondary | ICD-10-CM

## 2018-01-26 DIAGNOSIS — E119 Type 2 diabetes mellitus without complications: Secondary | ICD-10-CM | POA: Diagnosis not present

## 2018-01-26 DIAGNOSIS — I6381 Other cerebral infarction due to occlusion or stenosis of small artery: Principal | ICD-10-CM | POA: Diagnosis present

## 2018-01-26 DIAGNOSIS — E1165 Type 2 diabetes mellitus with hyperglycemia: Secondary | ICD-10-CM | POA: Diagnosis present

## 2018-01-26 DIAGNOSIS — Z8673 Personal history of transient ischemic attack (TIA), and cerebral infarction without residual deficits: Secondary | ICD-10-CM

## 2018-01-26 DIAGNOSIS — Z950 Presence of cardiac pacemaker: Secondary | ICD-10-CM

## 2018-01-26 DIAGNOSIS — I639 Cerebral infarction, unspecified: Secondary | ICD-10-CM | POA: Diagnosis present

## 2018-01-26 DIAGNOSIS — E876 Hypokalemia: Secondary | ICD-10-CM | POA: Diagnosis not present

## 2018-01-26 DIAGNOSIS — R29700 NIHSS score 0: Secondary | ICD-10-CM | POA: Diagnosis present

## 2018-01-26 DIAGNOSIS — F17210 Nicotine dependence, cigarettes, uncomplicated: Secondary | ICD-10-CM | POA: Diagnosis present

## 2018-01-26 LAB — COMPREHENSIVE METABOLIC PANEL
ALT: 14 U/L (ref 0–44)
AST: 17 U/L (ref 15–41)
Albumin: 4.1 g/dL (ref 3.5–5.0)
Alkaline Phosphatase: 86 U/L (ref 38–126)
Anion gap: 8 (ref 5–15)
BUN: 9 mg/dL (ref 6–20)
CO2: 29 mmol/L (ref 22–32)
Calcium: 8.8 mg/dL — ABNORMAL LOW (ref 8.9–10.3)
Chloride: 102 mmol/L (ref 98–111)
Creatinine, Ser: 0.99 mg/dL (ref 0.61–1.24)
GFR calc Af Amer: >60 mL/min (ref >60)
GFR calc non Af Amer: >60 mL/min (ref >60)
Glucose, Bld: 275 mg/dL — ABNORMAL HIGH (ref 70–99)
Potassium: 3.3 mmol/L — ABNORMAL LOW (ref 3.5–5.1)
Sodium: 139 mmol/L (ref 135–145)
Total Bilirubin: 0.8 mg/dL (ref 0.3–1.2)
Total Protein: 8.4 g/dL — ABNORMAL HIGH (ref 6.5–8.1)

## 2018-01-26 LAB — DIFFERENTIAL
Abs Immature Granulocytes: 0.03 10*3/uL (ref 0.00–0.07)
Basophils Absolute: 0 10*3/uL (ref 0.0–0.1)
Basophils Relative: 0 %
Eosinophils Absolute: 0 10*3/uL (ref 0.0–0.5)
Eosinophils Relative: 0 %
Immature Granulocytes: 0 %
Lymphocytes Relative: 12 %
Lymphs Abs: 0.9 10*3/uL (ref 0.7–4.0)
Monocytes Absolute: 0.2 10*3/uL (ref 0.1–1.0)
Monocytes Relative: 3 %
Neutro Abs: 6.4 10*3/uL (ref 1.7–7.7)
Neutrophils Relative %: 85 %

## 2018-01-26 LAB — I-STAT CHEM 8, ED
BUN: 8 mg/dL (ref 6–20)
Calcium, Ion: 1.15 mmol/L (ref 1.15–1.40)
Chloride: 99 mmol/L (ref 98–111)
Creatinine, Ser: 0.9 mg/dL (ref 0.61–1.24)
Glucose, Bld: 273 mg/dL — ABNORMAL HIGH (ref 70–99)
HCT: 48 % (ref 39.0–52.0)
Hemoglobin: 16.3 g/dL (ref 13.0–17.0)
Potassium: 3.3 mmol/L — ABNORMAL LOW (ref 3.5–5.1)
Sodium: 140 mmol/L (ref 135–145)
TCO2: 29 mmol/L (ref 22–32)

## 2018-01-26 LAB — CBC
HCT: 45.5 % (ref 39.0–52.0)
Hemoglobin: 14.7 g/dL (ref 13.0–17.0)
MCH: 30.4 pg (ref 26.0–34.0)
MCHC: 32.3 g/dL (ref 30.0–36.0)
MCV: 94 fL (ref 80.0–100.0)
Platelets: 122 10*3/uL — ABNORMAL LOW (ref 150–400)
RBC: 4.84 MIL/uL (ref 4.22–5.81)
RDW: 12.4 % (ref 11.5–15.5)
WBC: 7.6 10*3/uL (ref 4.0–10.5)
nRBC: 0 % (ref 0.0–0.2)

## 2018-01-26 LAB — I-STAT TROPONIN, ED: Troponin i, poc: 0 ng/mL (ref 0.00–0.08)

## 2018-01-26 LAB — PROTIME-INR
INR: 1.01
Prothrombin Time: 13.2 seconds (ref 11.4–15.2)

## 2018-01-26 LAB — CBG MONITORING, ED
GLUCOSE-CAPILLARY: 251 mg/dL — AB (ref 70–99)
Glucose-Capillary: 230 mg/dL — ABNORMAL HIGH (ref 70–99)

## 2018-01-26 LAB — APTT: aPTT: 24 seconds (ref 24–36)

## 2018-01-26 LAB — ETHANOL: Alcohol, Ethyl (B): <10 mg/dL (ref <10)

## 2018-01-26 MED ORDER — INSULIN ASPART 100 UNIT/ML ~~LOC~~ SOLN
0.0000 [IU] | Freq: Three times a day (TID) | SUBCUTANEOUS | Status: DC
  Administered 2018-01-27: 2 [IU] via SUBCUTANEOUS
  Administered 2018-01-27: 1 [IU] via SUBCUTANEOUS
  Administered 2018-01-28: 2 [IU] via SUBCUTANEOUS
  Filled 2018-01-26 (×2): qty 1

## 2018-01-26 MED ORDER — ASPIRIN 325 MG PO TABS
325.0000 mg | ORAL_TABLET | Freq: Every day | ORAL | Status: DC
  Administered 2018-01-27 – 2018-01-28 (×2): 325 mg via ORAL
  Filled 2018-01-26 (×2): qty 1

## 2018-01-26 MED ORDER — ONDANSETRON HCL 4 MG/2ML IJ SOLN
4.0000 mg | Freq: Once | INTRAMUSCULAR | Status: AC
  Administered 2018-01-26: 4 mg via INTRAVENOUS
  Filled 2018-01-26: qty 2

## 2018-01-26 MED ORDER — INSULIN ASPART 100 UNIT/ML ~~LOC~~ SOLN
0.0000 [IU] | Freq: Every day | SUBCUTANEOUS | Status: DC
  Administered 2018-01-26: 2 [IU] via SUBCUTANEOUS
  Filled 2018-01-26: qty 1

## 2018-01-26 MED ORDER — POTASSIUM CHLORIDE 10 MEQ/100ML IV SOLN
10.0000 meq | Freq: Once | INTRAVENOUS | Status: AC
  Administered 2018-01-26: 10 meq via INTRAVENOUS
  Filled 2018-01-26: qty 100

## 2018-01-26 MED ORDER — ACETAMINOPHEN 160 MG/5ML PO SOLN: 650.0000 mg | ORAL | Status: DC | PRN

## 2018-01-26 MED ORDER — LABETALOL HCL 5 MG/ML IV SOLN: 10.0000 mg | INTRAVENOUS | Status: DC | PRN

## 2018-01-26 MED ORDER — ACETAMINOPHEN 325 MG PO TABS: 650.0000 mg | ORAL_TABLET | ORAL | Status: DC | PRN

## 2018-01-26 MED ORDER — SODIUM CHLORIDE 0.9 % IV SOLN
INTRAVENOUS | Status: AC
  Administered 2018-01-26 – 2018-01-27 (×2): via INTRAVENOUS

## 2018-01-26 MED ORDER — ASPIRIN 300 MG RE SUPP
300.0000 mg | Freq: Every day | RECTAL | Status: DC
  Administered 2018-01-27: 300 mg via RECTAL
  Filled 2018-01-26: qty 1

## 2018-01-26 MED ORDER — POTASSIUM CHLORIDE CRYS ER 20 MEQ PO TBCR
20.0000 meq | EXTENDED_RELEASE_TABLET | Freq: Once | ORAL | Status: AC
  Administered 2018-01-26: 20 meq via ORAL
  Filled 2018-01-26: qty 1

## 2018-01-26 MED ORDER — STROKE: EARLY STAGES OF RECOVERY BOOK
Freq: Once | Status: DC
  Filled 2018-01-26 (×2): qty 1

## 2018-01-26 MED ORDER — ACETAMINOPHEN 650 MG RE SUPP: 650.0000 mg | RECTAL | Status: DC | PRN

## 2018-01-26 MED ORDER — ENOXAPARIN SODIUM 40 MG/0.4ML ~~LOC~~ SOLN
40.0000 mg | SUBCUTANEOUS | Status: DC
  Administered 2018-01-27 – 2018-01-28 (×2): 40 mg via SUBCUTANEOUS
  Filled 2018-01-26 (×2): qty 0.4

## 2018-01-26 MED ORDER — HYDRALAZINE HCL 20 MG/ML IJ SOLN: 10.0000 mg | INTRAMUSCULAR | Status: DC | PRN

## 2018-01-26 MED ORDER — SENNOSIDES-DOCUSATE SODIUM 8.6-50 MG PO TABS: 1.0000 | ORAL_TABLET | Freq: Every evening | ORAL | Status: DC | PRN

## 2018-01-26 NOTE — ED Notes (Signed)
[place], [place] requesting EKG. Given to Zenia Resides, MD

## 2018-01-26 NOTE — ED Notes (Signed)
Opyd, MD notified that pt passed stroke swallow screen.

## 2018-01-26 NOTE — ED Notes (Addendum)
Neuro tele cart at bedside

## 2018-01-26 NOTE — ED Notes (Signed)
Pt aware that urine sample is needed. Unable to provide at this time

## 2018-01-26 NOTE — ED Notes (Signed)
**   PT'S FAMILY WOULD LIKE TO BE CONTACTED REGARDING ALL UPDATES FOR PT AT ANYTIME**  -Linton Rump- [phone] [name] (sister)- [phone] - [name] (brother)- [phone]

## 2018-01-26 NOTE — ED Notes (Signed)
Pt still unable to void at this time 

## 2018-01-26 NOTE — Consult Note (Signed)
[facility] Consult Services   Date of Service:   01/26/2018 19:48:13  Impression:     .  RO Acute Ischemic Stroke  Comments: acute onset dizziness and n/v - DDx peripheral vertigo vs brainstem/cerebellar stroke. Recommend admission for stroke workup.  Mechanism of Stroke: Possible Thromboembolic Possible Cardioembolic Small Vessel Disease  Metrics: Last Known Well: 01/26/2018 13:00:00 [facility] Notification Time: 01/26/2018 19:48:13 Arrival Time: 01/26/2018 18:35:00 Stamp Time: 01/26/2018 19:48:13 Time First Login Attempt: 01/26/2018 19:55:53 Video Start Time: 01/26/2018 19:55:53  Symptoms: difficulty walking NIHSS Start Assessment Time: 01/26/2018 20:00:20 Patient is not a candidate for tPA. Patient was not deemed candidate for tPA due to LSN >4.5 hours. Video End Time: 01/26/2018 20:14:39  CT head showed no acute hemorrhage or acute core infarct. CT head was reviewed.  Advanced imaging was not obtained as the presentation was not suggestive of Large Vessel Occlusive Disease.   ER Physician notified of the decision on thrombolytics management on 01/26/2018 20:14:39  Our recommendations are outlined below.  Recommendations:     .  Activate Stroke Protocol Admission/Order Set     .  Stroke/Telemetry Floor     .  Neuro Checks     .  Bedside Swallow Eval     .  DVT Prophylaxis     .  IV Fluids, Normal Saline     .  Head of Bed Below 30 Degrees     .  Euglycemia and Avoid Hyperthermia (PRN Acetaminophen)     .  start ASA if CT head is neg for hemorrhage.  Routine Consultation with [facility] for Follow up Care  Sign Out:     .  Discussed with Emergency Department Provider    ------------------------------------------------------------------------------  History of Present Illness: Patient is a 58 year old Male.  Patient was brought by EMS for symptoms of difficulty walking  58 yo man with acute onset blurry  vision, dizziness/ataxia, and difficulty walking. He came from an internet parlor when he developed the above symptoms all of a sudden while he was sitting. He takes no blood thinners. He lives at home with his brother. He has not tried to walk since 1300 when he arrived to the internet parlor, so his LSN is at 1300  CT head showed no acute hemorrhage or acute core infarct. CT head was reviewed.    Examination: 1A: Level of Consciousness - Alert; keenly responsive + 0 1B: Ask Month and Age - Both Questions Right + 0 1C: Blink Eyes & Squeeze Hands - Performs Both Tasks + 0 2: Test Horizontal Extraocular Movements - Normal + 0 3: Test Visual Fields - No Visual Loss + 0 4: Test Facial Palsy (Use Grimace if Obtunded) - Normal symmetry + 0 5A: Test Left Arm Motor Drift - No Drift for 10 Seconds + 0 5B: Test Right Arm Motor Drift - No Drift for 10 Seconds + 0 6A: Test Left Leg Motor Drift - No Drift for 5 Seconds + 0 6B: Test Right Leg Motor Drift - No Drift for 5 Seconds + 0 7: Test Limb Ataxia (FNF/Heel-Shin) - No Ataxia + 0 8: Test Sensation - Normal; No sensory loss + 0 9: Test Language/Aphasia - Normal; No aphasia + 0 10: Test Dysarthria - Normal + 0 11: Test Extinction/Inattention - No abnormality + 0  NIHSS Score: 0  Patient was informed the Neurology Consult would happen via TeleHealth consult by way of interactive audio and video telecommunications and consented to receiving care in  this manner.  Due to the immediate potential for life-threatening deterioration due to underlying acute neurologic illness, I spent 35 minutes providing critical care. This time includes time for face to face visit via telemedicine, review of medical records, imaging studies and discussion of findings with providers, the patient and/or family.   Dr Hal Morales   [facility] [phone]  Case [id number]

## 2018-01-26 NOTE — ED Notes (Signed)
Pt reported being nauseous but no emesis.
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
## 2018-01-26 NOTE — ED Notes (Signed)
Attempted to get urine from pt. Pt still unable to provide sample but will inform staff when able to
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
## 2018-01-26 NOTE — ED Notes (Signed)
Patient transported to CT 
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
## 2018-01-26 NOTE — ED Triage Notes (Signed)
He tells me that, whilst at a [employer], he felt "dizzy and I couldn't walk right" [sic]. EMS were told by a "nurse" at that same gathering that they checked his cbg and found it to be in the 200's, which she considered "low". They then gave him food, and also gave him a metformin tablet. He arrives here in no distress and is oriented x 4 with clear speech.

## 2018-01-26 NOTE — ED Notes (Signed)
Bed: CO97 Expected date:  Expected time:  Means of arrival:  Comments: Hyperglycemia

## 2018-01-26 NOTE — ED Notes (Signed)
Pt had 2 episodes of emesis. [place], [place] aware

## 2018-01-27 ENCOUNTER — Observation Stay (HOSPITAL_COMMUNITY): Payer: BLUE CROSS/BLUE SHIELD

## 2018-01-27 ENCOUNTER — Other Ambulatory Visit (HOSPITAL_COMMUNITY): Payer: BLUE CROSS/BLUE SHIELD

## 2018-01-27 ENCOUNTER — Observation Stay (HOSPITAL_BASED_OUTPATIENT_CLINIC_OR_DEPARTMENT_OTHER): Payer: BLUE CROSS/BLUE SHIELD

## 2018-01-27 ENCOUNTER — Other Ambulatory Visit: Payer: Self-pay

## 2018-01-27 ENCOUNTER — Encounter (HOSPITAL_COMMUNITY): Payer: Self-pay | Admitting: *Deleted

## 2018-01-27 DIAGNOSIS — R29700 NIHSS score 0: Secondary | ICD-10-CM | POA: Diagnosis not present

## 2018-01-27 DIAGNOSIS — I160 Hypertensive urgency: Secondary | ICD-10-CM | POA: Diagnosis not present

## 2018-01-27 DIAGNOSIS — I6381 Other cerebral infarction due to occlusion or stenosis of small artery: Secondary | ICD-10-CM | POA: Diagnosis present

## 2018-01-27 DIAGNOSIS — E119 Type 2 diabetes mellitus without complications: Secondary | ICD-10-CM | POA: Diagnosis not present

## 2018-01-27 DIAGNOSIS — R270 Ataxia, unspecified: Secondary | ICD-10-CM | POA: Diagnosis not present

## 2018-01-27 DIAGNOSIS — Z89421 Acquired absence of other right toe(s): Secondary | ICD-10-CM | POA: Diagnosis not present

## 2018-01-27 DIAGNOSIS — F17210 Nicotine dependence, cigarettes, uncomplicated: Secondary | ICD-10-CM | POA: Diagnosis not present

## 2018-01-27 DIAGNOSIS — I351 Nonrheumatic aortic (valve) insufficiency: Secondary | ICD-10-CM

## 2018-01-27 DIAGNOSIS — E876 Hypokalemia: Secondary | ICD-10-CM | POA: Diagnosis not present

## 2018-01-27 DIAGNOSIS — Z950 Presence of cardiac pacemaker: Secondary | ICD-10-CM | POA: Diagnosis not present

## 2018-01-27 DIAGNOSIS — I639 Cerebral infarction, unspecified: Secondary | ICD-10-CM | POA: Diagnosis present

## 2018-01-27 DIAGNOSIS — Z7984 Long term (current) use of oral hypoglycemic drugs: Secondary | ICD-10-CM | POA: Diagnosis not present

## 2018-01-27 DIAGNOSIS — Z23 Encounter for immunization: Secondary | ICD-10-CM | POA: Diagnosis not present

## 2018-01-27 DIAGNOSIS — E1165 Type 2 diabetes mellitus with hyperglycemia: Secondary | ICD-10-CM | POA: Diagnosis not present

## 2018-01-27 DIAGNOSIS — Z8673 Personal history of transient ischemic attack (TIA), and cerebral infarction without residual deficits: Secondary | ICD-10-CM | POA: Diagnosis not present

## 2018-01-27 LAB — LIPID PANEL
Cholesterol: 124 mg/dL (ref 0–200)
HDL: 29 mg/dL — ABNORMAL LOW (ref >40)
LDL CALC: 84 mg/dL (ref 0–99)
Total CHOL/HDL Ratio: 4.3 RATIO
Triglycerides: 54 mg/dL (ref <150)
VLDL: 11 mg/dL (ref 0–40)

## 2018-01-27 LAB — MAGNESIUM: Magnesium: 1.8 mg/dL (ref 1.7–2.4)

## 2018-01-27 LAB — URINALYSIS, ROUTINE W REFLEX MICROSCOPIC
Bilirubin Urine: NEGATIVE
Glucose, UA: >=500 mg/dL — AB
Hgb urine dipstick: NEGATIVE
KETONES UR: NEGATIVE mg/dL
Leukocytes, UA: NEGATIVE
NITRITE: NEGATIVE
Protein, ur: NEGATIVE mg/dL
Specific Gravity, Urine: 1.023 (ref 1.005–1.030)
pH: 6 (ref 5.0–8.0)

## 2018-01-27 LAB — CBG MONITORING, ED
Glucose-Capillary: 145 mg/dL — ABNORMAL HIGH (ref 70–99)
Glucose-Capillary: 111 mg/dL — ABNORMAL HIGH (ref 70–99)

## 2018-01-27 LAB — GLUCOSE, CAPILLARY: Glucose-Capillary: 168 mg/dL — ABNORMAL HIGH (ref 70–99)

## 2018-01-27 LAB — ECHOCARDIOGRAM COMPLETE
HEIGHTINCHES: 66 in
WEIGHTICAEL: 2790 [oz_av]

## 2018-01-27 LAB — RAPID URINE DRUG SCREEN, HOSP PERFORMED
Amphetamines: NOT DETECTED
Barbiturates: NOT DETECTED
Benzodiazepines: NOT DETECTED
Cocaine: NOT DETECTED
Opiates: NOT DETECTED
Tetrahydrocannabinol: NOT DETECTED

## 2018-01-27 LAB — HIV ANTIBODY (ROUTINE TESTING W REFLEX): HIV Screen 4th Generation wRfx: NONREACTIVE

## 2018-01-27 LAB — HEMOGLOBIN A1C
Hgb A1c MFr Bld: 7.3 % — ABNORMAL HIGH (ref 4.8–5.6)
Mean Plasma Glucose: 162.81 mg/dL

## 2018-01-27 MED ORDER — INFLUENZA VAC SPLIT QUAD 0.5 ML IM SUSY
0.5000 mL | PREFILLED_SYRINGE | INTRAMUSCULAR | Status: AC
  Administered 2018-01-28: 0.5 mL via INTRAMUSCULAR
  Filled 2018-01-27: qty 0.5

## 2018-01-27 MED ORDER — PNEUMOCOCCAL VAC POLYVALENT 25 MCG/0.5ML IJ INJ
0.5000 mL | INJECTION | INTRAMUSCULAR | Status: AC
  Administered 2018-01-28: 0.5 mL via INTRAMUSCULAR
  Filled 2018-01-27: qty 0.5

## 2018-01-27 MED ORDER — CLOPIDOGREL BISULFATE 75 MG PO TABS
75.0000 mg | ORAL_TABLET | Freq: Every day | ORAL | Status: DC
  Administered 2018-01-27 – 2018-01-28 (×2): 75 mg via ORAL
  Filled 2018-01-27 (×3): qty 1

## 2018-01-27 MED ORDER — ATORVASTATIN CALCIUM 40 MG PO TABS
40.0000 mg | ORAL_TABLET | Freq: Every day | ORAL | Status: DC
  Administered 2018-01-27: 40 mg via ORAL
  Filled 2018-01-27 (×2): qty 1

## 2018-01-27 NOTE — ED Notes (Addendum)
Cheral Marker, MD called stating "he will be coming to see pt soon"

## 2018-01-27 NOTE — Treatment Plan (Addendum)
MRI results returned. 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 Discussed with on-call Neurologist. Recommendation to continue as planned and transfer to [facility] where Stroke Team will continue to follow.  ADDENDUM: Discussed case with Stroke Team. Stroke work up essentially complete. Recommendation for addition of statin and continuation on dual antiplatelet (ASA and plavix). Also OK to d/c home per Dr. Leonie Man. Updated patient. Patient still feeling nauseated and concerned he may not be able to tolerate dinner later. Patient earlier vomited breakfast. Will continue with supportive care with anti-emetics. Question nausea related to uncontrolled BP at presentation. However, would continue with permissive HTN for now given presenting stroke.

## 2018-01-27 NOTE — Progress Notes (Signed)
PROGRESS NOTE    [name]  WLN:[id number] DOB: 1960-06-25 DOA: 01/26/2018 PCP: Glendon Axe, MD    Brief Narrative:  58 y.o. male with medical history significant for type 2 diabetes mellitus,, now presenting to the emergency department for evaluation of off balance gait.  Patient reports that he woke in his usual state of health, was having an uneventful day, but when he stood tried to ambulate, he had difficulty balancing and describes that he was beginning to fall to the side.  Reports falling towards both sides.  Denies any headache, change in vision or hearing, focal weakness, chest pain, or palpitations.  Reports chronic right-sided numbness involving upper and lower extremities that he attributes to having diabetes.  Denies any fall or trauma.  Denies any nausea or abdominal pain at this time, but did have 2 episodes of nonbloody vomiting prior to admission.  Reports that he may have had similar episodes previously, but never lasting this long.  No recent fevers or chills.  ED Course: Upon arrival to the ED, patient is found to be afebrile, saturating well on room air, slightly bradycardic, and with blood pressure 200/100.  EKG features a sinus rhythm with nonspecific ST-T abnormalities similar to prior.  Noncontrast head CT reveals an age-indeterminate lacunar infarct involving the left thalamus.  Chemistry panel is notable for glucose of 275 and potassium 3.3.  CBC features a slight thrombocytopenia.  INR and troponin are normal.  Ethanol is undetectable.  Patient was evaluated by telemetry neurologist in the ED, was not a candidate for tPA due to presentation outside the window, but admission for CVA work-up was recommended.  Inpatient neurologist was consulted by the ED physician and agreed with a medical admission to [facility] for further evaluation and management of possible CVA  Assessment & Plan:   Principal Problem:   Ataxia Active Problems:   Hypertensive urgency   Diabetes mellitus type II, non insulin dependent (HCC)   Hypokalemia  1. Ataxia  - Presents with difficulty standing, reports falling towards both sides when trying to ambulate, had N/V, but denies sensation of room spinning - Head CT with age-indeterminate lacunar infarct involving left thalamus  - Neurology consulted  - Check MRI brain, MRA head, performed, pending results -carotid US without occlusion bilaterally -, echocardiogram reviewed with normal LVEF and mild hypokinesis noted. Trop neg -, A1c 7.3 -fasting lipids with LDL of 84 - Continue cardiac monitoring, frequent neuro checks, PT/OT/SLP evals. Thus far, recs for home health PT - Started ASA, permit HTN in acute-phase of suspected ischemic CVA    2. Hypertensive urgency  - BP as high as 200/100 in ED  - Permit HTN up to 740/814 for now given concern for acute ischemic CVA  - Cont monitor for now  3. Type II DM  - A1c was 5.9% in 2017, 7.7% in 2016  - Managed with metformin at home, held on admission  - Check CBG's and use a SSI with Novolog while in hospital  -Repeat A1c of 7.3  4. Hypokalemia  - Serum potassium is 3.3 on admission, likely secondary to GI-losses  - Replaced  - repeat bmet in AM   DVT prophylaxis: Lovenox subQ Code Status: Full Family Communication: Pt in room, family not at bedside Disposition Plan: Uncertain at this time  Consultants:   Neurology  Procedures:     Antimicrobials: Anti-infectives (From admission, onward)   None       Subjective: Feeling "dizzy" and nauseated  Objective: Vitals:  01/27/18 1117 01/27/18 1222 01/27/18 1224 01/27/18 1316  BP: (!) 165/90 (!) 177/98 (!) 177/98 (!) 178/93  Pulse: 65  70 66  Resp: 13  18 12   Temp:      TempSrc:      SpO2: 100%  100% 100%  Weight:      Height:        Intake/Output Summary (Last 24 hours) at 01/27/2018 1610 Last data filed at 01/27/2018 0834 Gross per 24 hour  Intake -  Output 650 ml  Net -650 ml   Filed  Weights   01/26/18 2001  Weight: 79.1 kg    Examination:  General exam: Appears calm and comfortable  Respiratory system: Clear to auscultation. Respiratory effort normal. Cardiovascular system: S1 & S2 heard, RRR Gastrointestinal system: Abdomen is nondistended, soft and nontender. No organomegaly or masses felt. Normal bowel sounds heard. Central nervous system: Alert and oriented. No focal neurological deficits. Extremities: Symmetric 5 x 5 power. Skin: No rashes, lesions Psychiatry: Judgement and insight appear normal. Mood & affect appropriate.   Data Reviewed: I have personally reviewed following labs and imaging studies  CBC: Recent Labs  Lab 01/26/18 1944 01/26/18 1952  WBC 7.6  --   NEUTROABS 6.4  --   HGB 14.7 16.3  HCT 45.5 48.0  MCV 94.0  --   PLT 122*  --    Basic Metabolic Panel: Recent Labs  Lab 01/26/18 1944 01/26/18 1952 01/27/18 0722  NA 139 140  --   K 3.3* 3.3*  --   CL 102 99  --   CO2 29  --   --   GLUCOSE 275* 273*  --   BUN 9 8  --   CREATININE 0.99 0.90  --   CALCIUM 8.8*  --   --   MG  --   --  1.8   GFR: Estimated Creatinine Clearance: 91.7 mL/min (by C-G formula based on SCr of 0.9 mg/dL). Liver Function Tests: Recent Labs  Lab 01/26/18 1944  AST 17  ALT 14  ALKPHOS 86  BILITOT 0.8  PROT 8.4*  ALBUMIN 4.1   No results for input(s): LIPASE, AMYLASE in the last 168 hours. No results for input(s): AMMONIA in the last 168 hours. Coagulation Profile: Recent Labs  Lab 01/26/18 1944  INR 1.01   Cardiac Enzymes: No results for input(s): CKTOTAL, CKMB, CKMBINDEX, TROPONINI in the last 168 hours. BNP (last 3 results) No results for input(s): PROBNP in the last 8760 hours. HbA1C: Recent Labs    01/27/18 0722  HGBA1C 7.3*   CBG: Recent Labs  Lab 01/26/18 1855 01/26/18 2247 01/27/18 0828  GLUCAP 251* 230* 145*   Lipid Profile: Recent Labs    01/27/18 0722  CHOL 124  HDL 29*  LDLCALC 84  TRIG 54  CHOLHDL 4.3    Thyroid Function Tests: No results for input(s): TSH, T4TOTAL, FREET4, T3FREE, THYROIDAB in the last 72 hours. Anemia Panel: No results for input(s): VITAMINB12, FOLATE, FERRITIN, TIBC, IRON, RETICCTPCT in the last 72 hours. Sepsis Labs: No results for input(s): PROCALCITON, LATICACIDVEN in the last 168 hours.  No results found for this or any previous visit (from the past 240 hour(s)).   Radiology Studies: Ct Head Code Stroke Wo Contrast  Result Date: 01/26/2018 CLINICAL DATA:  Code stroke. RIGHT extremity numbness. History of diabetes and pacemaker. EXAM: CT HEAD WITHOUT CONTRAST TECHNIQUE: Contiguous axial images were obtained from the base of the skull through the vertex without intravenous contrast. COMPARISON:  None.  FINDINGS: BRAIN: No intraparenchymal hemorrhage, mass effect nor midline shift. Moderate to severe parenchymal brain volume loss. No hydrocephalus. Age indeterminate LEFT thalamus lacunar infarct. No acute large vascular territory infarcts. No abnormal extra-axial fluid collections. Basal cisterns are patent. VASCULAR: Mild calcific atherosclerosis carotid siphon. SKULL/SOFT TISSUES: No skull fracture. No significant soft tissue swelling. ORBITS/SINUSES: The included ocular globes and orbital contents are nonacute, old RIGHT medial orbital blowout fracture. Paranasal sinus mucosal thickening. Mastoid air cells are well aerated. OTHER: None. ASPECTS [facility] Stroke Program Early CT Score) - Ganglionic level infarction (caudate, lentiform nuclei, internal capsule, insula, M1-M3 cortex): 7 - Supraganglionic infarction (M4-M6 cortex): 3 Total score (0-10 with 10 being normal): 10 IMPRESSION: 1. Age indeterminate LEFT thalamus lacunar infarct. 2. ASPECTS is 10. 3. Moderate to severe parenchymal brain volume loss, advanced for age. 4. Critical Value/emergent results were called by telephone at the time of interpretation on 01/26/2018 at 8:10 pm to Dr. Rodell Perna , who verbally acknowledged  these results. Electronically Signed   By: Elon Alas M.D.   On: 01/26/2018 20:11   Vas US Carotid (at [place] Only)  Result Date: 01/27/2018 Carotid Arterial Duplex Study Indications: CVA. Limitations: tortuous carotids Performing Technologist: Abram Sander RVS  Examination Guidelines: A complete evaluation includes B-mode imaging, spectral Doppler, color Doppler, and power Doppler as needed of all accessible portions of each vessel. Bilateral testing is considered an integral part of a complete examination. Limited examinations for reoccurring indications may be performed as noted.  Right Carotid Findings: +----------+--------+--------+--------+-----------+--------+           PSV cm/sEDV cm/sStenosisDescribe   Comments +----------+--------+--------+--------+-----------+--------+ CCA Prox  125     17              homogeneous         +----------+--------+--------+--------+-----------+--------+ CCA Distal80      14              homogeneous         +----------+--------+--------+--------+-----------+--------+ ICA Prox  69      13      1-39%   homogeneous         +----------+--------+--------+--------+-----------+--------+ ICA Distal68      19                                  +----------+--------+--------+--------+-----------+--------+ ECA       93      12                                  +----------+--------+--------+--------+-----------+--------+ +----------+--------+-------+--------+-------------------+           PSV cm/sEDV cmsDescribeArm Pressure (mmHG) +----------+--------+-------+--------+-------------------+ [id number]                                        +----------+--------+-------+--------+-------------------+ +---------+--------+--------+--------------+ VertebralPSV cm/sEDV cm/snot visualized +---------+--------+--------+--------------+  Left Carotid Findings: +----------+--------+--------+--------+-----------+--------------+            PSV cm/sEDV cm/sStenosisDescribe   Comments       +----------+--------+--------+--------+-----------+--------------+ CCA Prox  125     15              homogeneous               +----------+--------+--------+--------+-----------+--------------+ CCA Distal94      21  homogeneous               +----------+--------+--------+--------+-----------+--------------+ ICA Prox  80      17      1-39%   homogeneous               +----------+--------+--------+--------+-----------+--------------+ ICA Distal                                   Not visualized +----------+--------+--------+--------+-----------+--------------+ ECA       63      9                                         +----------+--------+--------+--------+-----------+--------------+ +----------+--------+--------+--------+-------------------+ SubclavianPSV cm/sEDV cm/sDescribeArm Pressure (mmHG) +----------+--------+--------+--------+-------------------+           131                                         +----------+--------+--------+--------+-------------------+ +---------+--------+--+--------+--+---------+ VertebralPSV cm/s63EDV cm/s13Antegrade +---------+--------+--+--------+--+---------+  Summary: Right Carotid: Velocities in the right ICA are consistent with a 1-39% stenosis. Left Carotid: Velocities in the left ICA are consistent with a 1-39% stenosis. Vertebrals: Left vertebral artery demonstrates antegrade flow. Right vertebral             artery was not visualized. *See table(s) above for measurements and observations.  Electronically signed by Antony Contras MD on 01/27/2018 at 2:07:58 PM.    Final     Scheduled Meds: .  stroke: mapping our early stages of recovery book   Does not apply Once  . aspirin  300 mg Rectal Daily   Or  . aspirin  325 mg Oral Daily  . enoxaparin (LOVENOX) injection  40 mg Subcutaneous Q24H  . insulin aspart  0-5 Units Subcutaneous QHS  . insulin aspart   0-9 Units Subcutaneous TID WC   Continuous Infusions:   LOS: 0 days   Marylu Lund, MD [facility] Pager On Amion  If 7PM-7AM, please contact night-coverage 01/27/2018, 4:10 PM

## 2018-01-27 NOTE — Evaluation (Signed)
Physical Therapy Evaluation Patient Details Name: [name] MRN: [id number] DOB: 11-Apr-1960 Today's Date: 01/27/2018   History of Present Illness  58 y.o. male with medical history significant for type 2 diabetes mellitus,, now presenting to the emergency department for evaluation of off balance gait.  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 Pt currently with functional limitations due to the deficits listed below (see PT Problem List). Pt ambulated 100' with RW with min/guard assist for safety. When pt attempted to ambulate without RW he had significant loss of balance requiring mod assist to recover. Pt reported feeling, "lightheaded" in sitting and standing, orthostatics were negative. At baseline, pt is independent with mobility and drives a fork lift for work.  Pt will benefit from skilled PT to increase their independence and safety with mobility to allow discharge to the venue listed below.       Follow Up Recommendations Outpatient PT;Home health PT;Supervision for mobility/OOB(outpt PT vs HHPT depending on progress)    Equipment Recommendations  Rolling walker with 5" wheels    Recommendations for Other Services       Precautions / Restrictions Precautions Precautions: Fall Precaution Comments: pt denies h/o falls Restrictions Weight Bearing Restrictions: No      Mobility  Bed Mobility Overal bed mobility: Independent                Transfers Overall transfer level: Needs assistance Equipment used: Rolling walker (2 wheeled) Transfers: Sit to/from Stand Sit to Stand: Min guard;Min assist         General transfer comment: posterior lean initially in standing, then able to maintain neutral with RW  Ambulation/Gait Ambulation/Gait assistance: Min assist;+2 safety/equipment Gait Distance (Feet): 100 Feet Assistive device: Rolling walker (2 wheeled) Gait  Pattern/deviations: Step-through pattern;Decreased stride length Gait velocity: decr   General Gait Details: unsteady without RW, with RW pt had no loss of balance  Stairs            Wheelchair Mobility    Modified Rankin (Stroke Patients Only)       Balance Overall balance assessment: Needs assistance Sitting-balance support: Feet supported;Single extremity supported Sitting balance-Leahy Scale: Fair     Standing balance support: Bilateral upper extremity supported Standing balance-Leahy Scale: Poor Standing balance comment: posterior lean intially, then fair with RW                             Pertinent Vitals/Pain Pain Assessment: No/denies pain    Home Living Family/patient expects to be discharged to:: Private residence Living Arrangements: Other relatives(brother) Available Help at Discharge: Family;Available 24 hours/day Type of Home: Apartment Home Access: Stairs to enter   Entrance Stairs-Number of Steps: flight Home Layout: Bed/bath upstairs Home Equipment: None      Prior Function Level of Independence: Independent         Comments: drives a forklift for work     Journalist, newspaper        Extremity/Trunk Assessment   Upper Extremity Assessment Upper Extremity Assessment: Defer to OT evaluation;Overall WFL for tasks assessed(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; no change from baseline)    Lower Extremity Assessment Lower Extremity Assessment: Overall WFL for tasks assessed    Cervical / Trunk Assessment Cervical / Trunk Assessment: Normal  Communication   Communication: No difficulties  Cognition Arousal/Alertness: Awake/alert Behavior During Therapy: WFL for tasks assessed/performed Overall Cognitive Status: Within Functional  Limits for tasks assessed                                        General Comments      Exercises     Assessment/Plan    PT Assessment Patient needs continued PT services  PT Problem  List Decreased balance       PT Treatment Interventions DME instruction;Gait training;Stair training;Therapeutic exercise;Therapeutic activities;Functional mobility training;Patient/family education    PT Goals (Current goals can be found in the Care Plan section)  Acute Rehab PT Goals Patient Stated Goal: return to work driving a forklift PT Goal Formulation: With patient Time For Goal Achievement: 02/10/18 Potential to Achieve Goals: Good    Frequency Min 4X/week   Barriers to discharge        Co-evaluation               AM-PAC PT "6 Clicks" Mobility  Outcome Measure Help needed turning from your back to your side while in a flat bed without using bedrails?: None Help needed moving from lying on your back to sitting on the side of a flat bed without using bedrails?: None Help needed moving to and from a bed to a chair (including a wheelchair)?: A Little Help needed standing up from a chair using your arms (e.g., wheelchair or bedside chair)?: A Little Help needed to walk in hospital room?: A Little Help needed climbing 3-5 steps with a railing? : A Lot 6 Click Score: 19    End of Session Equipment Utilized During Treatment: Gait belt Activity Tolerance: Patient tolerated treatment well Patient left: in bed;with call bell/phone within reach Nurse Communication: Mobility status PT Visit Diagnosis: Unsteadiness on feet (R26.81);Difficulty in walking, not elsewhere classified (R26.2)    Time: 1210-1228 PT Time Calculation (min) (ACUTE ONLY): 18 min   Charges:   PT Evaluation $PT Eval Low Complexity: 1 Low          Blondell Reveal Kistler PT 01/27/2018  Acute Rehabilitation Services Pager [phone] Office [phone]

## 2018-01-27 NOTE — ED Notes (Signed)
[facility] is here, and he is loaded onto their stretcher and transferred to [facility] without incident.

## 2018-01-27 NOTE — ED Notes (Signed)
I have just called report to Mickel Baas, RN on [place] at [facility]. Will call [facility] now for transport.

## 2018-01-27 NOTE — ED Notes (Signed)
MD at bedside. 

## 2018-01-27 NOTE — ED Notes (Signed)
ED TO INPATIENT HANDOFF REPORT  Name/Age/Gender [name] 58 y.o. male  Code Status    Code Status Orders  (From admission, onward)         Start     Ordered   01/26/18 2135  Full code  Continuous     01/26/18 2136        Code Status History    Date Active Date Inactive Code Status Order ID Comments User Context   10/31/2014 1613 11/03/2014 1804 Full Code [id number]  Sharlotte Alamo, MD Inpatient   10/30/2014 1325 10/31/2014 1613 Full Code [id number]  Epifanio Lesches, MD ED      Home/SNF/Other Home  Chief Complaint Hyperglycemia   Level of Care/Admitting Diagnosis ED Disposition    ED Disposition Condition [facility] Area: [place] [[id number]]  Level of Care: Medical Telemetry [104]  Diagnosis: Stroke [facility]) [[id number]]  Admitting Physician: Donne Hazel [phone]  Attending Physician: Donne Hazel [[id number]]  Estimated length of stay: 3 - 4 days  Certification:: I certify this patient will need inpatient services for at least 2 midnights  PT Class (Do Not Modify): Inpatient [101]  PT Acc Code (Do Not Modify): Private [1]       Medical History Past Medical History:  Diagnosis Date  . Diabetes mellitus without complication ([place])   . Presence of permanent cardiac pacemaker     Allergies No Known Allergies  IV Location/Drains/Wounds Patient Lines/Drains/Airways Status   Active Line/Drains/Airways    Name:   Placement date:   Placement time:   Site:   Days:   Peripheral IV 01/26/18 Right Antecubital   01/26/18    1830    Antecubital   1          Labs/Imaging Results for orders placed or performed during the hospital encounter of 01/26/18 (from the past 48 hour(s))  CBG monitoring, ED     Status: Abnormal   Collection Time: 01/26/18  6:55 PM  Result Value Ref Range   Glucose-Capillary 251 (H) 70 - 99 mg/dL  Ethanol     Status: None   Collection Time: 01/26/18  7:44 PM  Result Value Ref Range   Alcohol, Ethyl (B) <10  <10 mg/dL    Comment: (NOTE) Lowest detectable limit for serum alcohol is 10 mg/dL. For medical purposes only. Performed at [facility], [place] [address], [place], [place] [address]   Protime-INR     Status: None   Collection Time: 01/26/18  7:44 PM  Result Value Ref Range   Prothrombin Time 13.2 11.4 - 15.2 seconds   INR 1.01     Comment: Performed at [facility], [place] [address], [place], [place] [address]  APTT     Status: None   Collection Time: 01/26/18  7:44 PM  Result Value Ref Range   aPTT 24 24 - 36 seconds    Comment: Performed at [facility], [place] [address], [place], [place] [address]  CBC     Status: Abnormal   Collection Time: 01/26/18  7:44 PM  Result Value Ref Range   WBC 7.6 4.0 - 10.5 K/uL   RBC 4.84 4.22 - 5.81 MIL/uL   Hemoglobin 14.7 13.0 - 17.0 g/dL   HCT 45.5 39.0 - 52.0 %   MCV 94.0 80.0 - 100.0 fL   MCH 30.4 26.0 - 34.0 pg   MCHC 32.3 30.0 - 36.0 g/dL   RDW 12.4 11.5 - 15.5 %  Platelets 122 (L) 150 - 400 K/uL    Comment: REPEATED TO VERIFY SPECIMEN CHECKED FOR CLOTS    nRBC 0.0 0.0 - 0.2 %    Comment: Performed at [facility], [place] [address] [place], [place] [address]  Differential     Status: None   Collection Time: 01/26/18  7:44 PM  Result Value Ref Range   Neutrophils Relative % 85 %   Neutro Abs 6.4 1.7 - 7.7 K/uL   Lymphocytes Relative 12 %   Lymphs Abs 0.9 0.7 - 4.0 K/uL   Monocytes Relative 3 %   Monocytes Absolute 0.2 0.1 - 1.0 K/uL   Eosinophils Relative 0 %   Eosinophils Absolute 0.0 0.0 - 0.5 K/uL   Basophils Relative 0 %   Basophils Absolute 0.0 0.0 - 0.1 K/uL   Immature Granulocytes 0 %   Abs Immature Granulocytes 0.03 0.00 - 0.07 K/uL    Comment: Performed at [facility], [place] [address], [place], [place] [address]  Comprehensive metabolic panel     Status: Abnormal   Collection Time: 01/26/18  7:44 PM  Result Value Ref Range    Sodium 139 135 - 145 mmol/L   Potassium 3.3 (L) 3.5 - 5.1 mmol/L   Chloride 102 98 - 111 mmol/L   CO2 29 22 - 32 mmol/L   Glucose, Bld 275 (H) 70 - 99 mg/dL   BUN 9 6 - 20 mg/dL   Creatinine, Ser 0.99 0.61 - 1.24 mg/dL   Calcium 8.8 (L) 8.9 - 10.3 mg/dL   Total Protein 8.4 (H) 6.5 - 8.1 g/dL   Albumin 4.1 3.5 - 5.0 g/dL   AST 17 15 - 41 U/L   ALT 14 0 - 44 U/L   Alkaline Phosphatase 86 38 - 126 U/L   Total Bilirubin 0.8 0.3 - 1.2 mg/dL   GFR calc non Af Amer >60 >60 mL/min   GFR calc Af Amer >60 >60 mL/min   Anion gap 8 5 - 15    Comment: Performed at [facility], [place] [address], [place], [place] [address]  I-Stat Troponin, ED (not at [facility])     Status: None   Collection Time: 01/26/18  7:48 PM  Result Value Ref Range   Troponin i, poc 0.00 0.00 - 0.08 ng/mL   Comment 3            Comment: Due to the release kinetics of cTnI, a negative result within the first hours of the onset of symptoms does not rule out myocardial infarction with certainty. If myocardial infarction is still suspected, repeat the test at appropriate intervals.   I-Stat Chem 8, ED     Status: Abnormal   Collection Time: 01/26/18  7:52 PM  Result Value Ref Range   Sodium 140 135 - 145 mmol/L   Potassium 3.3 (L) 3.5 - 5.1 mmol/L   Chloride 99 98 - 111 mmol/L   BUN 8 6 - 20 mg/dL   Creatinine, Ser 0.90 0.61 - 1.24 mg/dL   Glucose, Bld 273 (H) 70 - 99 mg/dL   Calcium, Ion 1.15 1.15 - 1.40 mmol/L   TCO2 29 22 - 32 mmol/L   Hemoglobin 16.3 13.0 - 17.0 g/dL   HCT 48.0 39.0 - 52.0 %  CBG monitoring, ED     Status: Abnormal   Collection Time: 01/26/18 10:47 PM  Result Value Ref Range   Glucose-Capillary 230 (H) 70 - 99 mg/dL  HIV antibody (Routine Testing)  Status: None   Collection Time: 01/27/18  7:22 AM  Result Value Ref Range   HIV Screen 4th Generation wRfx Non Reactive Non Reactive    Comment: (NOTE) Performed At: [facility] [place], [place]  [id number] Rush Farmer MD BM:[id number]   Hemoglobin A1c     Status: Abnormal   Collection Time: 01/27/18  7:22 AM  Result Value Ref Range   Hgb A1c MFr Bld 7.3 (H) 4.8 - 5.6 %    Comment: (NOTE) Pre diabetes:          5.7%-6.4% Diabetes:              >6.4% Glycemic control for   <7.0% adults with diabetes    Mean Plasma Glucose 162.81 mg/dL    Comment: Performed at [facility], [place] [address], [place], [place] [address]  Lipid panel     Status: Abnormal   Collection Time: 01/27/18  7:22 AM  Result Value Ref Range   Cholesterol 124 0 - 200 mg/dL   Triglycerides 54 <150 mg/dL   HDL 29 (L) >40 mg/dL   Total CHOL/HDL Ratio 4.3 RATIO   VLDL 11 0 - 40 mg/dL   LDL Cholesterol 84 0 - 99 mg/dL    Comment:        Total Cholesterol/HDL:CHD Risk Coronary Heart Disease Risk Table                     Men   Women  1/2 Average Risk   3.4   3.3  Average Risk       5.0   4.4  2 X Average Risk   9.6   7.1  3 X Average Risk  23.4   11.0        Use the calculated Patient Ratio above and the CHD Risk Table to determine the patient's CHD Risk.        ATP III CLASSIFICATION (LDL):  <100     mg/dL   Optimal  100-129  mg/dL   Near or Above                    Optimal  130-159  mg/dL   Borderline  160-189  mg/dL   High  >190     mg/dL   Very High Performed at [place] [address] [place], [place] [address]   Magnesium     Status: None   Collection Time: 01/27/18  7:22 AM  Result Value Ref Range   Magnesium 1.8 1.7 - 2.4 mg/dL    Comment: Performed at [facility], [place] [address] [place], [place] [address]  CBG monitoring, ED     Status: Abnormal   Collection Time: 01/27/18  8:28 AM  Result Value Ref Range   Glucose-Capillary 145 (H) 70 - 99 mg/dL  Urine rapid drug screen (hosp performed)     Status: None   Collection Time: 01/27/18  8:34 AM  Result Value Ref Range   Opiates NONE DETECTED NONE DETECTED   Cocaine NONE DETECTED  NONE DETECTED   Benzodiazepines NONE DETECTED NONE DETECTED   Amphetamines NONE DETECTED NONE DETECTED   Tetrahydrocannabinol NONE DETECTED NONE DETECTED   Barbiturates NONE DETECTED NONE DETECTED    Comment: (NOTE) DRUG SCREEN FOR MEDICAL PURPOSES ONLY.  IF CONFIRMATION IS NEEDED FOR ANY PURPOSE, NOTIFY LAB WITHIN 5 DAYS. LOWEST DETECTABLE LIMITS FOR URINE DRUG SCREEN Drug Class  Cutoff (ng/mL) Amphetamine and metabolites    1000 Barbiturate and metabolites    200 Benzodiazepine                 370 Tricyclics and metabolites     300 Opiates and metabolites        300 Cocaine and metabolites        300 THC                            50 Performed at [facility], [place] [address] [place], [place] [address]   Urinalysis, Routine w reflex microscopic     Status: Abnormal   Collection Time: 01/27/18  8:34 AM  Result Value Ref Range   Color, Urine YELLOW YELLOW   APPearance CLEAR CLEAR   Specific Gravity, Urine 1.023 1.005 - 1.030   pH 6.0 5.0 - 8.0   Glucose, UA >=500 (A) NEGATIVE mg/dL   Hgb urine dipstick NEGATIVE NEGATIVE   Bilirubin Urine NEGATIVE NEGATIVE   Ketones, ur NEGATIVE NEGATIVE mg/dL   Protein, ur NEGATIVE NEGATIVE mg/dL   Nitrite NEGATIVE NEGATIVE   Leukocytes, UA NEGATIVE NEGATIVE   RBC / HPF 0-5 0 - 5 RBC/hpf   WBC, UA 0-5 0 - 5 WBC/hpf   Bacteria, UA RARE (A) NONE SEEN   Mucus PRESENT     Comment: Performed at [facility], [place] [address] [place], [place] [address]   Mr Brain Wo Contrast  Result Date: 01/27/2018 CLINICAL DATA:  58 y/o  M; episode of dizziness and ataxia. EXAM: MRI HEAD WITHOUT CONTRAST MRA HEAD WITHOUT CONTRAST TECHNIQUE: Multiplanar, multiecho pulse sequences of the brain and surrounding structures were obtained without intravenous contrast. Angiographic images of the head were obtained using MRA technique without contrast. COMPARISON:  01/26/2018 CT head FINDINGS: MRI HEAD FINDINGS  Brain: 8 mm focus of reduced diffusion within the left posterior pons/brachium pontis (series 4, image 15 and series 400, image 15) compatible with acute/early subacute infarction. No associated hemorrhage or mass effect. Small chronic infarcts are present within the bilateral cerebellar hemispheres, bilateral thalami, and the right caudate head. Nonspecific T2 FLAIR hyperintensities in predominantly periventricular white matter are compatible with mild chronic microvascular ischemic changes for age. Moderate volume loss of the brain. No extra-axial collection, hemorrhage, mass effect, or herniation. Vascular: As below. Skull and upper cervical spine: Normal marrow signal. Sinuses/Orbits: Left maxillary sinus mucous retention cyst. Right lamina papyracea chronic fracture with medial displacement. No additional abnormal signal of the paranasal sinuses or mastoid air cells. Other: None. MRA HEAD FINDINGS Moderate motion artifact, suboptimal assessment for small 2-3 mm aneurysm or subtle stenosis. Anterior circulation: No large vessel occlusion, high-grade stenosis, or large aneurysm. Posterior circulation: No flow related signal within the right vertebral artery. Patent left vertebral artery, basilar artery, and bilateral posterior cerebral arteries. Anatomic variation: Complete circle-of-Willis. IMPRESSION: MRI head: 1. 8 mm acute/early subacute infarction within the left posterior pons/brachium pontis. No associated hemorrhage or mass effect. 2. 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 3. Mild chronic microvascular ischemic changes and moderate volume loss of the brain. MRA head: 1. Moderate motion artifact, suboptimal assessment for small aneurysm or subtle stenosis. 2. No flow related signal within the right vertebral artery. Findings may reflect slow flow due to high-grade stenosis or occlusion, age indeterminate. 3. No additional large vessel occlusion, segment of  high-grade stenosis, or large aneurysm identified. These results will be  called to the ordering clinician or representative by the Radiologist Assistant, and communication documented in the PACS or zVision Dashboard. Electronically Signed   By: Kristine Garbe M.D.   On: 01/27/2018 16:14   Mr Jodene Nam Head Wo Contrast  Result Date: 01/27/2018 CLINICAL DATA:  58 y/o  M; episode of dizziness and ataxia. EXAM: MRI HEAD WITHOUT CONTRAST MRA HEAD WITHOUT CONTRAST TECHNIQUE: Multiplanar, multiecho pulse sequences of the brain and surrounding structures were obtained without intravenous contrast. Angiographic images of the head were obtained using MRA technique without contrast. COMPARISON:  01/26/2018 CT head FINDINGS: MRI HEAD FINDINGS Brain: 8 mm focus of reduced diffusion within the left posterior pons/brachium pontis (series 4, image 15 and series 400, image 15) compatible with acute/early subacute infarction. No associated hemorrhage or mass effect. Small chronic infarcts are present within the bilateral cerebellar hemispheres, bilateral thalami, and the right caudate head. Nonspecific T2 FLAIR hyperintensities in predominantly periventricular white matter are compatible with mild chronic microvascular ischemic changes for age. Moderate volume loss of the brain. No extra-axial collection, hemorrhage, mass effect, or herniation. Vascular: As below. Skull and upper cervical spine: Normal marrow signal. Sinuses/Orbits: Left maxillary sinus mucous retention cyst. Right lamina papyracea chronic fracture with medial displacement. No additional abnormal signal of the paranasal sinuses or mastoid air cells. Other: None. MRA HEAD FINDINGS Moderate motion artifact, suboptimal assessment for small 2-3 mm aneurysm or subtle stenosis. Anterior circulation: No large vessel occlusion, high-grade stenosis, or large aneurysm. Posterior circulation: No flow related signal within the right vertebral artery. Patent left vertebral  artery, basilar artery, and bilateral posterior cerebral arteries. Anatomic variation: Complete circle-of-Willis. IMPRESSION: MRI head: 1. 8 mm acute/early subacute infarction within the left posterior pons/brachium pontis. No associated hemorrhage or mass effect. 2. 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 3. Mild chronic microvascular ischemic changes and moderate volume loss of the brain. MRA head: 1. Moderate motion artifact, suboptimal assessment for small aneurysm or subtle stenosis. 2. No flow related signal within the right vertebral artery. Findings may reflect slow flow due to high-grade stenosis or occlusion, age indeterminate. 3. No additional large vessel occlusion, segment of high-grade stenosis, or large aneurysm identified. These results will be called to the ordering clinician or representative by the Radiologist Assistant, and communication documented in the PACS or zVision Dashboard. Electronically Signed   By: Kristine Garbe M.D.   On: 01/27/2018 16:14   Ct Head Code Stroke Wo Contrast  Result Date: 01/26/2018 CLINICAL DATA:  Code stroke. RIGHT extremity numbness. History of diabetes and pacemaker. EXAM: CT HEAD WITHOUT CONTRAST TECHNIQUE: Contiguous axial images were obtained from the base of the skull through the vertex without intravenous contrast. COMPARISON:  None. FINDINGS: BRAIN: No intraparenchymal hemorrhage, mass effect nor midline shift. Moderate to severe parenchymal brain volume loss. No hydrocephalus. Age indeterminate LEFT thalamus lacunar infarct. No acute large vascular territory infarcts. No abnormal extra-axial fluid collections. Basal cisterns are patent. VASCULAR: Mild calcific atherosclerosis carotid siphon. SKULL/SOFT TISSUES: No skull fracture. No significant soft tissue swelling. ORBITS/SINUSES: The included ocular globes and orbital contents are nonacute, old RIGHT medial orbital blowout fracture. Paranasal sinus  mucosal thickening. Mastoid air cells are well aerated. OTHER: None. ASPECTS [facility] Stroke Program Early CT Score) - Ganglionic level infarction (caudate, lentiform nuclei, internal capsule, insula, M1-M3 cortex): 7 - Supraganglionic infarction (M4-M6 cortex): 3 Total score (0-10 with 10 being normal): 10 IMPRESSION: 1. Age indeterminate LEFT thalamus lacunar infarct. 2. ASPECTS is 10. 3. Moderate to severe  parenchymal brain volume loss, advanced for age. 4. Critical Value/emergent results were called by telephone at the time of interpretation on 01/26/2018 at 8:10 pm to Dr. Rodell Perna , who verbally acknowledged these results. Electronically Signed   By: Elon Alas M.D.   On: 01/26/2018 20:11   Vas US Carotid (at [place] Only)  Result Date: 01/27/2018 Carotid Arterial Duplex Study Indications: CVA. Limitations: tortuous carotids Performing Technologist: Abram Sander RVS  Examination Guidelines: A complete evaluation includes B-mode imaging, spectral Doppler, color Doppler, and power Doppler as needed of all accessible portions of each vessel. Bilateral testing is considered an integral part of a complete examination. Limited examinations for reoccurring indications may be performed as noted.  Right Carotid Findings: +----------+--------+--------+--------+-----------+--------+           PSV cm/sEDV cm/sStenosisDescribe   Comments +----------+--------+--------+--------+-----------+--------+ CCA Prox  125     17              homogeneous         +----------+--------+--------+--------+-----------+--------+ CCA Distal80      14              homogeneous         +----------+--------+--------+--------+-----------+--------+ ICA Prox  69      13      1-39%   homogeneous         +----------+--------+--------+--------+-----------+--------+ ICA Distal68      19                                  +----------+--------+--------+--------+-----------+--------+ ECA       93      12                                   +----------+--------+--------+--------+-----------+--------+ +----------+--------+-------+--------+-------------------+           PSV cm/sEDV cmsDescribeArm Pressure (mmHG) +----------+--------+-------+--------+-------------------+ [id number]                                        +----------+--------+-------+--------+-------------------+ +---------+--------+--------+--------------+ VertebralPSV cm/sEDV cm/snot visualized +---------+--------+--------+--------------+  Left Carotid Findings: +----------+--------+--------+--------+-----------+--------------+           PSV cm/sEDV cm/sStenosisDescribe   Comments       +----------+--------+--------+--------+-----------+--------------+ CCA Prox  125     15              homogeneous               +----------+--------+--------+--------+-----------+--------------+ CCA Distal94      21              homogeneous               +----------+--------+--------+--------+-----------+--------------+ ICA Prox  80      17      1-39%   homogeneous               +----------+--------+--------+--------+-----------+--------------+ ICA Distal                                   Not visualized +----------+--------+--------+--------+-----------+--------------+ ECA       63      9                                         +----------+--------+--------+--------+-----------+--------------+ +----------+--------+--------+--------+-------------------+  SubclavianPSV cm/sEDV cm/sDescribeArm Pressure (mmHG) +----------+--------+--------+--------+-------------------+           131                                         +----------+--------+--------+--------+-------------------+ +---------+--------+--+--------+--+---------+ VertebralPSV cm/s63EDV cm/s13Antegrade +---------+--------+--+--------+--+---------+  Summary: Right Carotid: Velocities in the right ICA are consistent with a 1-39% stenosis.  Left Carotid: Velocities in the left ICA are consistent with a 1-39% stenosis. Vertebrals: Left vertebral artery demonstrates antegrade flow. Right vertebral             artery was not visualized. *See table(s) above for measurements and observations.  Electronically signed by Antony Contras MD on 01/27/2018 at 2:07:58 PM.    Final    EKG Interpretation  Date/Time:  Saturday January 26 2018 19:13:02 EST Ventricular Rate:  59 PR Interval:    QRS Duration: 113 QT Interval:  433 QTC Calculation: 429 R Axis:   47 Text Interpretation:  Sinus rhythm Borderline intraventricular conduction delay Nonspecific T abnormalities, lateral leads ST elevation, consider anterior injury No significant change since last tracing Confirmed by Dorie Rank [phone]) on 01/26/2018 7:31:26 PM   Pending Labs Unresulted Labs (From admission, onward)    Start     Ordered   02/02/18 0500  Creatinine, serum  (enoxaparin (LOVENOX)    CrCl >/= 30 ml/min)  Weekly,   R    Comments:  while on enoxaparin therapy    01/26/18 2136   01/28/18 3557  Basic metabolic panel  Tomorrow morning,   R     01/27/18 1625          Vitals/Pain Today's Vitals   01/27/18 1224 01/27/18 1316 01/27/18 1644 01/27/18 1700  BP: (!) 177/98 (!) 178/93 (!) 181/97 (!) 180/95  Pulse: 70 66 65 61  Resp: 18 12 14    Temp:      TempSrc:      SpO2: 100% 100% 99% 99%  Weight:      Height:      PainSc:        Isolation Precautions No active isolations  Medications Medications   stroke: mapping our early stages of recovery book (has no administration in time range)  0.9 %  sodium chloride infusion ( Intravenous New Bag/Given 01/27/18 0928)  acetaminophen (TYLENOL) tablet 650 mg (has no administration in time range)    Or  acetaminophen (TYLENOL) solution 650 mg (has no administration in time range)    Or  acetaminophen (TYLENOL) suppository 650 mg (has no administration in time range)  senna-docusate (Senokot-S) tablet 1 tablet (has no  administration in time range)  enoxaparin (LOVENOX) injection 40 mg (40 mg Subcutaneous Given 01/27/18 0955)  aspirin suppository 300 mg (300 mg Rectal Given 01/27/18 0955)    Or  aspirin tablet 325 mg ( Oral See Alternative 01/27/18 0955)  insulin aspart (novoLOG) injection 0-9 Units (2 Units Subcutaneous Given 01/27/18 1306)  insulin aspart (novoLOG) injection 0-5 Units (2 Units Subcutaneous Given 01/26/18 2301)  hydrALAZINE (APRESOLINE) injection 10 mg (has no administration in time range)  clopidogrel (PLAVIX) tablet 75 mg (has no administration in time range)  atorvastatin (LIPITOR) tablet 40 mg (has no administration in time range)  ondansetron (ZOFRAN) injection 4 mg (4 mg Intravenous Given 01/26/18 1914)  ondansetron (ZOFRAN) injection 4 mg (4 mg Intravenous Given 01/26/18 2111)  potassium chloride 10 mEq in 100 mL IVPB (0 mEq Intravenous Stopped  01/27/18 0019)  potassium chloride SA (K-DUR,KLOR-CON) CR tablet 20 mEq (20 mEq Oral Given 01/26/18 2257)    Mobility walks

## 2018-01-27 NOTE — Plan of Care (Signed)
Pt is alert and oriented X4. Able to communicate needs. Pt has been oriented to the room and has been educated on smoking cessation, S/S of stroke/TIA, medication adherence, and to find a PCP after discharge. Pt agreed with the POC and verbalized understanding.

## 2018-01-27 NOTE — Progress Notes (Signed)
  Echocardiogram 2D Echocardiogram has been performed.  [name] 01/27/2018, 12:10 PM

## 2018-01-27 NOTE — ED Notes (Signed)
He has just competed his carotid doppler test. He remains in no distress.

## 2018-01-27 NOTE — ED Notes (Signed)
Pt still unable to give urine sample

## 2018-01-27 NOTE — ED Notes (Signed)
Spoke [name] about update on pt.

## 2018-01-27 NOTE — ED Notes (Signed)
He has had his P.T. consult. It has also come to our attention that pt. Is to have MRI. We notified them at 1100 hours. They tell us they will come in to perform the MRI.

## 2018-01-27 NOTE — Progress Notes (Signed)
Carotid duplex has been completed.   Preliminary results in CV Proc.   Abram Sander 01/27/2018 1:13 PM

## 2018-01-27 NOTE — ED Notes (Signed)
As I write this, he is in MRI.

## 2018-01-27 NOTE — ED Notes (Signed)
Attempted to call the family members of pt. No answer for all three numbers

## 2018-01-28 DIAGNOSIS — I639 Cerebral infarction, unspecified: Secondary | ICD-10-CM

## 2018-01-28 DIAGNOSIS — R270 Ataxia, unspecified: Secondary | ICD-10-CM

## 2018-01-28 LAB — GLUCOSE, CAPILLARY
Glucose-Capillary: 104 mg/dL — ABNORMAL HIGH (ref 70–99)
Glucose-Capillary: 190 mg/dL — ABNORMAL HIGH (ref 70–99)

## 2018-01-28 LAB — CBG MONITORING, ED: Glucose-Capillary: 153 mg/dL — ABNORMAL HIGH (ref 70–99)

## 2018-01-28 MED ORDER — ASPIRIN 81 MG PO TBEC: 81.0000 mg | DELAYED_RELEASE_TABLET | Freq: Every day | ORAL | 0 refills | Status: DC

## 2018-01-28 MED ORDER — METFORMIN HCL 500 MG PO TABS: 500.0000 mg | ORAL_TABLET | Freq: Two times a day (BID) | ORAL | 0 refills | Status: DC

## 2018-01-28 MED ORDER — ASPIRIN EC 81 MG PO TBEC: 81.0000 mg | DELAYED_RELEASE_TABLET | Freq: Every day | ORAL | Status: DC

## 2018-01-28 MED ORDER — CLOPIDOGREL BISULFATE 75 MG PO TABS: 75.0000 mg | ORAL_TABLET | Freq: Every day | ORAL | 0 refills | Status: DC

## 2018-01-28 MED ORDER — ATORVASTATIN CALCIUM 40 MG PO TABS: 40.0000 mg | ORAL_TABLET | Freq: Every day | ORAL | 0 refills | Status: DC

## 2018-01-28 NOTE — Progress Notes (Signed)
Discharge instructions (including medications) discussed with and copy provided to patient/caregiver 

## 2018-01-28 NOTE — Progress Notes (Signed)
Occupational Therapy Evaluation Patient Details Name: [name] MRN: [id number] DOB: [birth year] Today's Date: 01/28/2018    History of Present Illness 58 y.o. male with medical history significant for type 2 diabetes mellitus,, now presenting to the emergency department for evaluation of off balance gait.  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 Pt states he has not been "taking care of his diabetes and would like to talk with a nurse". Pt currently requires min A for mobility for ADL and S/set up with bathing/dressing due to deficits listed below. Discussed pt's interest in diabetes education with nsg. Will follow acutely to facilitate safe DC home with intermittent S of brother. Recommend follow up with outpt OT to facilitate return to IADL tasks and work. Pt states his brother can drive him to outpt therapy and that he prefers to do therapy in [facility] if possible.     Follow Up Recommendations  Outpatient OT;Supervision - Intermittent    Equipment Recommendations  3 in 1 bedside commode    Recommendations for Other Services       Precautions / Restrictions Precautions Precautions: Fall Precaution Comments: pt denies h/o falls Restrictions Weight Bearing Restrictions: No      Mobility Bed Mobility Overal bed mobility: Independent                Transfers Overall transfer level: Needs assistance Equipment used: Rolling walker (2 wheeled) Transfers: Sit to/from Stand Sit to Stand: Min guard         General transfer comment: posterior lean initially in standing, bracing self against bed; lost balance x 2 and was allowed to return to sitting to show pt he had difficulty with dynamic stanidng balance    Balance Overall balance assessment: Needs  assistance Sitting-balance support: Feet supported;Single extremity supported Sitting balance-Leahy Scale: Good     Standing balance support: Bilateral upper extremity supported Standing balance-Leahy Scale: Poor Standing balance comment: posterior lean intially, then fair with RW                       ADL either performed or assessed with clinical judgement   ADL Overall ADL's : Needs assistance/impaired Eating/Feeding: Independent   Grooming: Supervision/safety;Standing   Upper Body Bathing: Set up;Sitting   Lower Body Bathing: Supervison/ safety;Sit to/from stand   Upper Body Dressing : Set up;Sitting   Lower Body Dressing: Supervision/safety;Sit to/from stand   Toilet Transfer: Min guard;Ambulation   Toileting- Clothing Manipulation and Hygiene: Supervision/safety;Sit to/from stand       Functional mobility during ADLs: Minimal assistance;Rolling walker;Cueing for safety;Cueing for sequencing General ADL Comments: LOB to right when turning due to picking up RW from floor. Educated pt on need to take his time and to not complete quick movements due to high fall risk; encouraged use of 3in1 as Public house manager; pt verbalized understanidng     Vision Baseline Vision/History: No visual deficits Vision Assessment?: No apparent visual deficits     Perception     Praxis      Pertinent Vitals/Pain Pain Assessment: No/denies pain     Hand Dominance Right   Extremity/Trunk Assessment Upper Extremity Assessment Upper Extremity Assessment: Overall WFL for tasks assessed   Lower Extremity Assessment Lower Extremity Assessment: Defer to PT evaluation   Cervical / Trunk Assessment Cervical / Trunk  Assessment: Normal   Communication Communication Communication: No difficulties   Cognition Arousal/Alertness: Awake/alert Behavior During Therapy: WFL for tasks assessed/performed Overall Cognitive Status: No family/caregiver present to determine baseline cognitive  functioning                                 General Comments: Pt stating he diesn't need a stool/chair to sit on in shower given balance deficits; after allowing pt to lose his balance several times, pt agreed to need for shower chair   General Comments       Exercises     Shoulder Instructions      Home Living Family/patient expects to be discharged to:: Private residence Living Arrangements: Other relatives Available Help at Discharge: Family;Available 24 hours/day Type of Home: Apartment Home Access: Stairs to enter [employer] of Steps: flight   Home Layout: Bed/bath upstairs     Bathroom Shower/Tub: Corporate investment banker: Standard Bathroom Accessibility: Yes How Accessible: Accessible via walker Home Equipment: None          Prior Functioning/Environment Level of Independence: Independent        Comments: drives a forklift for work        OT Problem List: Impaired balance (sitting and/or standing);Decreased safety awareness;Decreased knowledge of use of DME or AE      OT Treatment/Interventions: Self-care/ADL training;Therapeutic exercise;Neuromuscular education;DME and/or AE instruction;Therapeutic activities;Patient/family education;Balance training    OT Goals(Current goals can be found in the care plan section) Acute Rehab OT Goals Patient Stated Goal: return to work driving a forklift OT Goal Formulation: With patient Time For Goal Achievement: 02/11/18 Potential to Achieve Goals: Good  OT Frequency: Min 2X/week   Barriers to D/C:            Co-evaluation              AM-PAC OT "6 Clicks" Daily Activity     Outcome Measure Help from another person eating meals?: None Help from another person taking care of personal grooming?: None Help from another person toileting, which includes using toliet, bedpan, or urinal?: A Little Help from another person bathing (including washing, rinsing,  drying)?: A Little Help from another person to put on and taking off regular upper body clothing?: None Help from another person to put on and taking off regular lower body clothing?: A Little 6 Click Score: 21   End of Session Equipment Utilized During Treatment: Gait belt;Rolling walker Nurse Communication: Mobility status  Activity Tolerance: Patient tolerated treatment well Patient left: in bed;with call bell/phone within reach;with bed alarm set  OT Visit Diagnosis: Unsteadiness on feet (R26.81);Other abnormalities of gait and mobility (R26.89);Ataxia, unspecified (R27.0)                Time: 8588-5027 OT Time Calculation (min): 15 min Charges:  OT General Charges $OT Visit: 1 Visit OT Evaluation $OT Eval Moderate Complexity: [place], OT/L   Acute OT Clinical Specialist Acute Rehabilitation Services Pager [phone] Office [phone]   [facility] 01/28/2018, 1:39 PM

## 2018-01-28 NOTE — Discharge Summary (Signed)
Physician Discharge Summary  Darris Staiger FGH:[id number] DOB: [birth year] DOA: 56/04/2018  PCP: Glendon Axe, MD  Admit date: 01/26/2018 Discharge date: 01/28/2018  Admitted From: Home Disposition: Home   Recommendations for Outpatient Follow-up:  1. Follow up with PCP in 1-2 weeks, needs to check to see if new insurance covers them.  2. Follow up with neurology. Office to contact patient for follow up. 3. Continue risk factor modification. 4. Recommend nonemergent referral to cardiology for ischemic evaluation due to inferolateral wall hypokinesis on echocardiogram.   Home Health: Outpatient OT and PT Equipment/Devices: 3 in 1, rolling walker Discharge Condition: Stable CODE STATUS: Full Diet recommendation: Heart healthy, carb-modified  Brief/Interim Summary: 58 y.o.malewith medical history significant fortype 2 diabetes mellitus,, now presenting to the emergency department for evaluation of off balance gait. Patient reports that he woke in his usual state of health, was having an uneventful day, but when he stood tried to ambulate, he had difficulty balancing and describes that he was beginning to fall to the side. Reports falling towards both sides. Denies any headache, change in vision or hearing, focal weakness, chest pain, or palpitations. Reports chronic right-sided numbness involving upper and lower extremities that he attributes to having diabetes. Denies any fall or trauma. Denies any nausea or abdominal pain at this time, but did have 2 episodes of nonbloody vomiting prior to admission. Reports that he may have had similar episodes previously, but never lasting this long. No recent fevers or chills.  ED Course:Upon arrival to the ED, patient is found to be afebrile, saturating well on room air, slightly bradycardic, and with blood pressure 200/100. EKG features a sinus rhythm with nonspecific ST-T abnormalities similar to prior. Noncontrast head CT reveals an  age-indeterminate lacunar infarct involving the left thalamus. Chemistry panel is notable for glucose of 275 and potassium 3.3. CBC features a slight thrombocytopenia. INR and troponin are normal. Ethanol is undetectable. Patient was evaluated by telemetry neurologist in the ED, was not a candidate for tPA due to presentation outside the window, but admission for CVA work-up was recommended. Inpatient neurologist was consulted by the ED physician and agreed with a medical admission to [facility] for further evaluation and management of possible CVA  Hospital Course: MRI demonstrated acute left pontine CVA. Neurology was consulted and the patient transferred [facility] for stroke team evaluation. Dual antiplatelet therapy was started.  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 MRA showed antegrade LVA flow and no RVA flow, bilateral ICA 1-39% stenosis. No CES on echocardiogram (EF 55-60%). Dizziness symptomatically much improved.  - DAPT per neurology x3 weeks, then aspirin alone. - Start statin. LDL 84.  - PCP follow up to determine whether antihypertensive is needed. - Needs better blood glucose control. Has not been taking metformin, HbA1c 7.3%, though was in goal range when taking metformin so will represcribe this with plan to follow up with CBGs at PCP.  - Outpatient OT, PT recommended.  HTN with hypertensive urgency: Permissive HTN allowed during admission, long-term goal normotensive.  - No medications PTA - Urgency resolved spontaneously.   T2DM: HbA1c was 5.9% in 2017, 7.3% now.  - Restart metformin and follow up with PCP  Hypokalemia -Serum potassium  is 3.3 on  admission, likely secondary to GI-losses -Replaced  Inferolateral hypokinesis on echocardiogram: No chest pain, troponin negative.  - Consider outpatient ischemic work up.  Discharge Instructions Discharge Instructions    Ambulatory referral to Neurology   Complete by:  As directed    Follow up with stroke clinic NP (Jessica Vanschaick or Cecille Rubin, if both not available, consider Dr. Antony Contras, Dr. Bess Harvest, or Dr. Sarina Ill) at [facility] in about 4 weeks.   Ambulatory referral to Physical Therapy   Complete by:  As directed    Diet - low sodium heart healthy   Complete by:  As directed    Diet Carb Modified   Complete by:  As directed    Discharge instructions   Complete by:  As directed    - Start taking aspirin 81mg  every day. For the next 3 weeks you will also take plavix everyday.  - Restart metformin, a new prescription was sent to your pharmacy - No new blood pressure medications were started, though you will need close follow up with your doctor, so schedule an appointment as soon as possible. If they are not on your insurance plan, call to get scheduled with a primary doctor who is covered ASAP. - Follow up with neurology will be arranged and communicated with you after discharge. - Start lipitor to reduce cholesterol and reduce your future risk of stroke   Increase activity slowly   Complete by:  As directed      Allergies as of 01/28/2018   No Known Allergies     Medication List    TAKE these medications   aspirin 81 MG EC tablet Take 1 tablet (81 mg total) by mouth daily. Start taking on:  January 29, 2018   atorvastatin 40 MG tablet Commonly known as:  LIPITOR Take 1 tablet (40 mg total) by mouth daily at 6 PM.   clopidogrel 75 MG tablet Commonly known as:  PLAVIX Take 1 tablet (75 mg total) by mouth daily with breakfast. Start taking on:  January 29, 2018   metFORMIN 500 MG tablet Commonly known as:   GLUCOPHAGE Take 1 tablet (500 mg total) by mouth 2 (two) times daily with a meal.            Durable Medical Equipment  (From admission, onward)         Start     Ordered   01/28/18 1518  For home use only DME 3 n 1  Once     01/28/18 1517   01/28/18 1242  For home use only DME Walker rolling  Once    Question:  Patient needs a walker to treat with the following condition  Answer:  Stroke [facility])   01/28/18 1241         Follow-up Information    [facility] Follow up.   Specialty:  Rehabilitation Why:  [place] will contact you for the first appointment Contact information: [employer] [id number] [address] [place] [phone]       [facility] Follow up in 4 week(s).   Specialty:  Neurology Why:  Stroke clinic.  Office will call with appointment date and time. Contact information: [address] [place] [phone]       Glendon Axe, MD. Schedule an appointment as soon as possible for a visit.   Specialty:  Internal Medicine Contact information: [address]  [place] [address] [phone]          No Known Allergies  Consultations:  Neurology  Procedures/Studies: Mr Brain 101 Contrast  Result Date: 01/27/2018 CLINICAL DATA:  58 y/o  M; episode of dizziness and ataxia. EXAM: MRI HEAD WITHOUT CONTRAST MRA HEAD WITHOUT CONTRAST TECHNIQUE: Multiplanar, multiecho pulse sequences of the brain and surrounding structures were obtained without intravenous contrast. Angiographic images of the head were obtained using MRA technique without contrast. COMPARISON:  01/26/2018 CT head FINDINGS: MRI HEAD FINDINGS Brain: 8 mm focus of reduced diffusion within the left posterior pons/brachium pontis (series 4, image 15 and series 400, image 15) compatible with acute/early subacute infarction. No associated hemorrhage or  mass effect. Small chronic infarcts are present within the bilateral cerebellar hemispheres, bilateral thalami, and the right caudate head. Nonspecific T2 FLAIR hyperintensities in predominantly periventricular white matter are compatible with mild chronic microvascular ischemic changes for age. Moderate volume loss of the brain. No extra-axial collection, hemorrhage, mass effect, or herniation. Vascular: As below. Skull and upper cervical spine: Normal marrow signal. Sinuses/Orbits: Left maxillary sinus mucous retention cyst. Right lamina papyracea chronic fracture with medial displacement. No additional abnormal signal of the paranasal sinuses or mastoid air cells. Other: None. MRA HEAD FINDINGS Moderate motion artifact, suboptimal assessment for small 2-3 mm aneurysm or subtle stenosis. Anterior circulation: No large vessel occlusion, high-grade stenosis, or large aneurysm. Posterior circulation: No flow related signal within the right vertebral artery. Patent left vertebral artery, basilar artery, and bilateral posterior cerebral arteries. Anatomic variation: Complete circle-of-Willis. IMPRESSION: MRI head: 1. 8 mm acute/early subacute infarction within the left posterior pons/brachium pontis. No associated hemorrhage or mass effect. 2. 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 3. Mild chronic microvascular ischemic changes and moderate volume loss of the brain. MRA head: 1. Moderate motion artifact, suboptimal assessment for small aneurysm or subtle stenosis. 2. No flow related signal within the right vertebral artery. Findings may reflect slow flow due to high-grade stenosis or occlusion, age indeterminate. 3. No additional large vessel occlusion, segment of high-grade stenosis, or large aneurysm identified. These results will be called to the ordering clinician or representative by the Radiologist Assistant, and communication documented in the PACS or zVision  Dashboard. Electronically Signed   By: Kristine Garbe M.D.   On: 01/27/2018 16:14   Mr Jodene Nam Head Wo Contrast  Result Date: 01/27/2018 CLINICAL DATA:  58 y/o  M; episode of dizziness and ataxia. EXAM: MRI HEAD WITHOUT CONTRAST MRA HEAD WITHOUT CONTRAST TECHNIQUE: Multiplanar, multiecho pulse sequences of the brain and surrounding structures were obtained without intravenous contrast. Angiographic images of the head were obtained using MRA technique without contrast. COMPARISON:  01/26/2018 CT head FINDINGS: MRI HEAD FINDINGS Brain: 8 mm focus of reduced diffusion within the left posterior pons/brachium pontis (series 4, image 15 and series 400, image 15) compatible with acute/early subacute infarction. No associated hemorrhage or mass effect. Small chronic infarcts are present within the bilateral cerebellar hemispheres, bilateral thalami, and the right caudate head. Nonspecific T2 FLAIR hyperintensities in predominantly periventricular white matter are compatible with mild chronic microvascular ischemic changes for age. Moderate volume loss of the brain. No extra-axial collection, hemorrhage, mass effect, or herniation. Vascular: As below. Skull and upper cervical spine: Normal marrow signal. Sinuses/Orbits: Left maxillary sinus mucous retention cyst. Right lamina papyracea chronic fracture with medial displacement. No additional abnormal signal of the paranasal sinuses or mastoid air cells. Other: None. MRA HEAD FINDINGS Moderate motion artifact,  suboptimal assessment for small 2-3 mm aneurysm or subtle stenosis. Anterior circulation: No large vessel occlusion, high-grade stenosis, or large aneurysm. Posterior circulation: No flow related signal within the right vertebral artery. Patent left vertebral artery, basilar artery, and bilateral posterior cerebral arteries. Anatomic variation: Complete circle-of-Willis. IMPRESSION: MRI head: 1. 8 mm acute/early subacute infarction within the left posterior  pons/brachium pontis. No associated hemorrhage or mass effect. 2. 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 3. Mild chronic microvascular ischemic changes and moderate volume loss of the brain. MRA head: 1. Moderate motion artifact, suboptimal assessment for small aneurysm or subtle stenosis. 2. No flow related signal within the right vertebral artery. Findings may reflect slow flow due to high-grade stenosis or occlusion, age indeterminate. 3. No additional large vessel occlusion, segment of high-grade stenosis, or large aneurysm identified. These results will be called to the ordering clinician or representative by the Radiologist Assistant, and communication documented in the PACS or zVision Dashboard. Electronically Signed   By: Kristine Garbe M.D.   On: 01/27/2018 16:14   Ct Head Code Stroke Wo Contrast  Result Date: 01/26/2018 CLINICAL DATA:  Code stroke. RIGHT extremity numbness. History of diabetes and pacemaker. EXAM: CT HEAD WITHOUT CONTRAST TECHNIQUE: Contiguous axial images were obtained from the base of the skull through the vertex without intravenous contrast. COMPARISON:  None. FINDINGS: BRAIN: No intraparenchymal hemorrhage, mass effect nor midline shift. Moderate to severe parenchymal brain volume loss. No hydrocephalus. Age indeterminate LEFT thalamus lacunar infarct. No acute large vascular territory infarcts. No abnormal extra-axial fluid collections. Basal cisterns are patent. VASCULAR: Mild calcific atherosclerosis carotid siphon. SKULL/SOFT TISSUES: No skull fracture. No significant soft tissue swelling. ORBITS/SINUSES: The included ocular globes and orbital contents are nonacute, old RIGHT medial orbital blowout fracture. Paranasal sinus mucosal thickening. Mastoid air cells are well aerated. OTHER: None. ASPECTS [facility] Stroke Program Early CT Score) - Ganglionic level infarction (caudate, lentiform nuclei, internal capsule, insula,  M1-M3 cortex): 7 - Supraganglionic infarction (M4-M6 cortex): 3 Total score (0-10 with 10 being normal): 10 IMPRESSION: 1. Age indeterminate LEFT thalamus lacunar infarct. 2. ASPECTS is 10. 3. Moderate to severe parenchymal brain volume loss, advanced for age. 4. Critical Value/emergent results were called by telephone at the time of interpretation on 01/26/2018 at 8:10 pm to Dr. Rodell Perna , who verbally acknowledged these results. Electronically Signed   By: Elon Alas M.D.   On: 01/26/2018 20:11   Vas US Carotid (at [place] Only)  Result Date: 01/27/2018 Carotid Arterial Duplex Study Indications: CVA. Limitations: tortuous carotids Performing Technologist: Abram Sander RVS  Examination Guidelines: A complete evaluation includes B-mode imaging, spectral Doppler, color Doppler, and power Doppler as needed of all accessible portions of each vessel. Bilateral testing is considered an integral part of a complete examination. Limited examinations for reoccurring indications may be performed as noted.  Right Carotid Findings: +----------+--------+--------+--------+-----------+--------+           PSV cm/sEDV cm/sStenosisDescribe   Comments +----------+--------+--------+--------+-----------+--------+ CCA Prox  125     17              homogeneous         +----------+--------+--------+--------+-----------+--------+ CCA Distal80      14              homogeneous         +----------+--------+--------+--------+-----------+--------+ ICA Prox  69      13      1-39%   homogeneous         +----------+--------+--------+--------+-----------+--------+  ICA Distal68      19                                  +----------+--------+--------+--------+-----------+--------+ ECA       93      12                                  +----------+--------+--------+--------+-----------+--------+ +----------+--------+-------+--------+-------------------+           PSV cm/sEDV cmsDescribeArm  Pressure (mmHG) +----------+--------+-------+--------+-------------------+ [id number]                                        +----------+--------+-------+--------+-------------------+ +---------+--------+--------+--------------+ VertebralPSV cm/sEDV cm/snot visualized +---------+--------+--------+--------------+  Left Carotid Findings: +----------+--------+--------+--------+-----------+--------------+           PSV cm/sEDV cm/sStenosisDescribe   Comments       +----------+--------+--------+--------+-----------+--------------+ CCA Prox  125     15              homogeneous               +----------+--------+--------+--------+-----------+--------------+ CCA Distal94      21              homogeneous               +----------+--------+--------+--------+-----------+--------------+ ICA Prox  80      17      1-39%   homogeneous               +----------+--------+--------+--------+-----------+--------------+ ICA Distal                                   Not visualized +----------+--------+--------+--------+-----------+--------------+ ECA       63      9                                         +----------+--------+--------+--------+-----------+--------------+ +----------+--------+--------+--------+-------------------+ SubclavianPSV cm/sEDV cm/sDescribeArm Pressure (mmHG) +----------+--------+--------+--------+-------------------+           131                                         +----------+--------+--------+--------+-------------------+ +---------+--------+--+--------+--+---------+ VertebralPSV cm/s63EDV cm/s13Antegrade +---------+--------+--+--------+--+---------+  Summary: Right Carotid: Velocities in the right ICA are consistent with a 1-39% stenosis. Left Carotid: Velocities in the left ICA are consistent with a 1-39% stenosis. Vertebrals: Left vertebral artery demonstrates antegrade flow. Right vertebral             artery was not visualized.  *See table(s) above for measurements and observations.  Electronically signed by Antony Contras MD on 01/27/2018 at 2:07:58 PM.    Final     Echocardiogram 01/26/2018: - Left ventricle: The cavity size was normal. Wall thickness was   increased in a pattern of mild LVH. Systolic function was normal.   The estimated ejection fraction was in the range of 55% to 60%.   There is hypokinesis of the inferolateral myocardium. Doppler   parameters are consistent with abnormal left ventricular   relaxation (grade 1 diastolic dysfunction). - Aortic valve:  Noncoronary cusp mobility was restricted. There was   mild regurgitation. - Mitral valve: Calcified annulus.  Impressions: - Mild hypokinesis of the inferolateral wall with overall preserved   LV systolic function; mild diastolic dysfuction; mild   [id number] aortic valve with mild AI,  Subjective: Balance improving, more functionally mobile today. Denies chest pain or dyspnea, wants to go home.   Discharge Exam: Vitals:   01/28/18 0908 01/28/18 1158  BP: (!) 147/83 (!) 154/83  Pulse: 95 67  Resp: 18 18  Temp: 98.3 F (36.8 C) 98.2 F (36.8 C)  SpO2:     General: Pt is alert, awake, not in acute distress Cardiovascular: RRR, S1/S2 +, no rubs, no gallops Respiratory: CTA bilaterally, no wheezing, no rhonchi Abdominal: Soft, NT, ND, bowel sounds + Extremities: No edema, no cyanosis Neuro: Alert, oriented, no focal weakness or sensory deficit. No nystagmus.  Labs: BNP (last 3 results) No results for input(s): BNP in the last 8760 hours. Basic Metabolic Panel: Recent Labs  Lab 01/26/18 1944 01/26/18 1952 01/27/18 0722  NA 139 140  --   K 3.3* 3.3*  --   CL 102 99  --   CO2 29  --   --   GLUCOSE 275* 273*  --   BUN 9 8  --   CREATININE 0.99 0.90  --   CALCIUM 8.8*  --   --   MG  --   --  1.8   Liver Function Tests: Recent Labs  Lab 01/26/18 1944  AST 17  ALT 14  ALKPHOS 86  BILITOT 0.8  PROT 8.4*  ALBUMIN 4.1    No results for input(s): LIPASE, AMYLASE in the last 168 hours. No results for input(s): AMMONIA in the last 168 hours. CBC: Recent Labs  Lab 01/26/18 1944 01/26/18 1952  WBC 7.6  --   NEUTROABS 6.4  --   HGB 14.7 16.3  HCT 45.5 48.0  MCV 94.0  --   PLT 122*  --    Cardiac Enzymes: No results for input(s): CKTOTAL, CKMB, CKMBINDEX, TROPONINI in the last 168 hours. BNP: Invalid input(s): POCBNP CBG: Recent Labs  Lab 01/27/18 1303 01/27/18 1758 01/27/18 2154 01/28/18 0635 01/28/18 1158  GLUCAP 153* 111* 168* 104* 190*   D-Dimer No results for input(s): DDIMER in the last 72 hours. Hgb A1c Recent Labs    01/27/18 0722  HGBA1C 7.3*   Lipid Profile Recent Labs    01/27/18 0722  CHOL 124  HDL 29*  LDLCALC 84  TRIG 54  CHOLHDL 4.3   Thyroid function studies No results for input(s): TSH, T4TOTAL, T3FREE, THYROIDAB in the last 72 hours.  Invalid input(s): FREET3 Anemia work up No results for input(s): VITAMINB12, FOLATE, FERRITIN, TIBC, IRON, RETICCTPCT in the last 72 hours. Urinalysis    Component Value Date/Time   COLORURINE YELLOW 01/27/2018 0834   APPEARANCEUR CLEAR 01/27/2018 0834   APPEARANCEUR Clear 12/29/2013 1214   LABSPEC 1.023 01/27/2018 0834   LABSPEC 1.017 12/29/2013 1214   PHURINE 6.0 01/27/2018 0834   GLUCOSEU >=500 (A) 01/27/2018 0834   GLUCOSEU >=500 12/29/2013 [place] 01/27/2018 [place] 01/27/2018 0834   BILIRUBINUR Negative 12/29/2013 1214   [place] 01/27/2018 0834   PROTEINUR NEGATIVE 01/27/2018 0834   NITRITE NEGATIVE 01/27/2018 [place] 01/27/2018 0834   LEUKOCYTESUR Negative 12/29/2013 1214    Microbiology No results found for this or any previous visit (from the past 240 hour(s)).  Time  coordinating discharge: Approximately 40 minutes  Patrecia Pour, MD  [facility] 01/28/2018, 3:30 PM Pager [phone]

## 2018-01-28 NOTE — Progress Notes (Addendum)
STROKE TEAM PROGRESS NOTE  HPI: ( Dr Cheral Marker )[name] is an 58 y.o. male who presented to the [facility] ED for evaluation of dizziness and unstable gait which started at a [employer] gathering. A "nurse" at the gathering had checked his CBG and noted that it was in the 200's, which she considered to be "low" so he was given "food" and a "metformin tablet". At the [facility] ED a Teleneurology consult was obtained; per the Teleneurologist, the patient's presentation was suggestive of peripheral vertigo versus a brainstem or cerebellar stroke. NIHSS score was 0. CT head was obtained revealing an age-indeterminate left thalamus lacunar infarction.  INTERVAL HISTORY His RN is at the bedside.  He was transferred form [facility] last evening. He still has paresthesias  Vitals:   01/27/18 2307 01/28/18 0309 01/28/18 0908 01/28/18 1158  BP: (!) 157/89 (!) 162/87 (!) 147/83 (!) 154/83  Pulse: 69 61 95 67  Resp: 17 18 18 18   Temp: 98.5 F (36.9 C) 98.5 F (36.9 C) 98.3 F (36.8 C) 98.2 F (36.8 C)  TempSrc: Oral Oral Oral Oral  SpO2: 95% 99%    Weight: 79.2 kg     Height: 5\' 6"  (1.676 m)       CBC:  Recent Labs  Lab 01/26/18 1944 01/26/18 1952  WBC 7.6  --   NEUTROABS 6.4  --   HGB 14.7 16.3  HCT 45.5 48.0  MCV 94.0  --   PLT 122*  --     Basic Metabolic Panel:  Recent Labs  Lab 01/26/18 1944 01/26/18 1952 01/27/18 0722  NA 139 140  --   K 3.3* 3.3*  --   CL 102 99  --   CO2 29  --   --   GLUCOSE 275* 273*  --   BUN 9 8  --   CREATININE 0.99 0.90  --   CALCIUM 8.8*  --   --   MG  --   --  1.8   Lipid Panel:     Component Value Date/Time   CHOL 124 01/27/2018 0722   TRIG 54 01/27/2018 0722   HDL 29 (L) 01/27/2018 0722   CHOLHDL 4.3 01/27/2018 0722   VLDL 11 01/27/2018 0722   LDLCALC 84 01/27/2018 0722   HgbA1c:  Lab Results  Component Value Date   HGBA1C 7.3 (H) 01/27/2018   Urine Drug Screen:     Component Value Date/Time   LABOPIA NONE DETECTED 01/27/2018 0834   COCAINSCRNUR  NONE DETECTED 01/27/2018 0834   LABBENZ NONE DETECTED 01/27/2018 0834   AMPHETMU NONE DETECTED 01/27/2018 0834   THCU NONE DETECTED 01/27/2018 0834   LABBARB NONE DETECTED 01/27/2018 0834    Alcohol Level     Component Value Date/Time   ETH <10 01/26/2018 1944    IMAGING Mr Brain Wo Contrast  Result Date: 01/27/2018 CLINICAL DATA:  58 y/o  M; episode of dizziness and ataxia. EXAM: MRI HEAD WITHOUT CONTRAST MRA HEAD WITHOUT CONTRAST TECHNIQUE: Multiplanar, multiecho pulse sequences of the brain and surrounding structures were obtained without intravenous contrast. Angiographic images of the head were obtained using MRA technique without contrast. COMPARISON:  01/26/2018 CT head FINDINGS: MRI HEAD FINDINGS Brain: 8 mm focus of reduced diffusion within the left posterior pons/brachium pontis (series 4, image 15 and series 400, image 15) compatible with acute/early subacute infarction. No associated hemorrhage or mass effect. Small chronic infarcts are present within the bilateral cerebellar hemispheres, bilateral thalami, and the right caudate head. Nonspecific T2 FLAIR  hyperintensities in predominantly periventricular white matter are compatible with mild chronic microvascular ischemic changes for age. Moderate volume loss of the brain. No extra-axial collection, hemorrhage, mass effect, or herniation. Vascular: As below. Skull and upper cervical spine: Normal marrow signal. Sinuses/Orbits: Left maxillary sinus mucous retention cyst. Right lamina papyracea chronic fracture with medial displacement. No additional abnormal signal of the paranasal sinuses or mastoid air cells. Other: None. MRA HEAD FINDINGS Moderate motion artifact, suboptimal assessment for small 2-3 mm aneurysm or subtle stenosis. Anterior circulation: No large vessel occlusion, high-grade stenosis, or large aneurysm. Posterior circulation: No flow related signal within the right vertebral artery. Patent left vertebral artery, basilar  artery, and bilateral posterior cerebral arteries. Anatomic variation: Complete circle-of-Willis. IMPRESSION: MRI head: 1. 8 mm acute/early subacute infarction within the left posterior pons/brachium pontis. No associated hemorrhage or mass effect. 2. 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 3. Mild chronic microvascular ischemic changes and moderate volume loss of the brain. MRA head: 1. Moderate motion artifact, suboptimal assessment for small aneurysm or subtle stenosis. 2. No flow related signal within the right vertebral artery. Findings may reflect slow flow due to high-grade stenosis or occlusion, age indeterminate. 3. No additional large vessel occlusion, segment of high-grade stenosis, or large aneurysm identified. These results will be called to the ordering clinician or representative by the Radiologist Assistant, and communication documented in the PACS or zVision Dashboard. Electronically Signed   By: Kristine Garbe M.D.   On: 01/27/2018 16:14   Mr Jodene Nam Head Wo Contrast  Result Date: 01/27/2018 CLINICAL DATA:  58 y/o  M; episode of dizziness and ataxia. EXAM: MRI HEAD WITHOUT CONTRAST MRA HEAD WITHOUT CONTRAST TECHNIQUE: Multiplanar, multiecho pulse sequences of the brain and surrounding structures were obtained without intravenous contrast. Angiographic images of the head were obtained using MRA technique without contrast. COMPARISON:  01/26/2018 CT head FINDINGS: MRI HEAD FINDINGS Brain: 8 mm focus of reduced diffusion within the left posterior pons/brachium pontis (series 4, image 15 and series 400, image 15) compatible with acute/early subacute infarction. No associated hemorrhage or mass effect. Small chronic infarcts are present within the bilateral cerebellar hemispheres, bilateral thalami, and the right caudate head. Nonspecific T2 FLAIR hyperintensities in predominantly periventricular white matter are compatible with mild chronic  microvascular ischemic changes for age. Moderate volume loss of the brain. No extra-axial collection, hemorrhage, mass effect, or herniation. Vascular: As below. Skull and upper cervical spine: Normal marrow signal. Sinuses/Orbits: Left maxillary sinus mucous retention cyst. Right lamina papyracea chronic fracture with medial displacement. No additional abnormal signal of the paranasal sinuses or mastoid air cells. Other: None. MRA HEAD FINDINGS Moderate motion artifact, suboptimal assessment for small 2-3 mm aneurysm or subtle stenosis. Anterior circulation: No large vessel occlusion, high-grade stenosis, or large aneurysm. Posterior circulation: No flow related signal within the right vertebral artery. Patent left vertebral artery, basilar artery, and bilateral posterior cerebral arteries. Anatomic variation: Complete circle-of-Willis. IMPRESSION: MRI head: 1. 8 mm acute/early subacute infarction within the left posterior pons/brachium pontis. No associated hemorrhage or mass effect. 2. 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 3. Mild chronic microvascular ischemic changes and moderate volume loss of the brain. MRA head: 1. Moderate motion artifact, suboptimal assessment for small aneurysm or subtle stenosis. 2. No flow related signal within the right vertebral artery. Findings may reflect slow flow due to high-grade stenosis or occlusion, age indeterminate. 3. No additional large vessel occlusion, segment of high-grade stenosis, or large aneurysm identified.  These results will be called to the ordering clinician or representative by the Radiologist Assistant, and communication documented in the PACS or zVision Dashboard. Electronically Signed   By: Kristine Garbe M.D.   On: 01/27/2018 16:14   Ct Head Code Stroke Wo Contrast  Result Date: 01/26/2018 CLINICAL DATA:  Code stroke. RIGHT extremity numbness. History of diabetes and pacemaker. EXAM: CT HEAD WITHOUT  CONTRAST TECHNIQUE: Contiguous axial images were obtained from the base of the skull through the vertex without intravenous contrast. COMPARISON:  None. FINDINGS: BRAIN: No intraparenchymal hemorrhage, mass effect nor midline shift. Moderate to severe parenchymal brain volume loss. No hydrocephalus. Age indeterminate LEFT thalamus lacunar infarct. No acute large vascular territory infarcts. No abnormal extra-axial fluid collections. Basal cisterns are patent. VASCULAR: Mild calcific atherosclerosis carotid siphon. SKULL/SOFT TISSUES: No skull fracture. No significant soft tissue swelling. ORBITS/SINUSES: The included ocular globes and orbital contents are nonacute, old RIGHT medial orbital blowout fracture. Paranasal sinus mucosal thickening. Mastoid air cells are well aerated. OTHER: None. ASPECTS [facility] Stroke Program Early CT Score) - Ganglionic level infarction (caudate, lentiform nuclei, internal capsule, insula, M1-M3 cortex): 7 - Supraganglionic infarction (M4-M6 cortex): 3 Total score (0-10 with 10 being normal): 10 IMPRESSION: 1. Age indeterminate LEFT thalamus lacunar infarct. 2. ASPECTS is 10. 3. Moderate to severe parenchymal brain volume loss, advanced for age. 4. Critical Value/emergent results were called by telephone at the time of interpretation on 01/26/2018 at 8:10 pm to Dr. Rodell Perna , who verbally acknowledged these results. Electronically Signed   By: Elon Alas M.D.   On: 01/26/2018 20:11   Vas US Carotid (at [place] Only)  Result Date: 01/27/2018 Carotid Arterial Duplex Study Indications: CVA. Limitations: tortuous carotids Performing Technologist: Abram Sander RVS  Examination Guidelines: A complete evaluation includes B-mode imaging, spectral Doppler, color Doppler, and power Doppler as needed of all accessible portions of each vessel. Bilateral testing is considered an integral part of a complete examination. Limited examinations for reoccurring indications may be performed as  noted.  Right Carotid Findings: +----------+--------+--------+--------+-----------+--------+           PSV cm/sEDV cm/sStenosisDescribe   Comments +----------+--------+--------+--------+-----------+--------+ CCA Prox  125     17              homogeneous         +----------+--------+--------+--------+-----------+--------+ CCA Distal80      14              homogeneous         +----------+--------+--------+--------+-----------+--------+ ICA Prox  69      13      1-39%   homogeneous         +----------+--------+--------+--------+-----------+--------+ ICA Distal68      19                                  +----------+--------+--------+--------+-----------+--------+ ECA       93      12                                  +----------+--------+--------+--------+-----------+--------+ +----------+--------+-------+--------+-------------------+           PSV cm/sEDV cmsDescribeArm Pressure (mmHG) +----------+--------+-------+--------+-------------------+ [id number]                                        +----------+--------+-------+--------+-------------------+ +---------+--------+--------+--------------+  [place] cm/sEDV cm/snot visualized +---------+--------+--------+--------------+  Left Carotid Findings: +----------+--------+--------+--------+-----------+--------------+           PSV cm/sEDV cm/sStenosisDescribe   Comments       +----------+--------+--------+--------+-----------+--------------+ CCA Prox  125     15              homogeneous               +----------+--------+--------+--------+-----------+--------------+ CCA Distal94      21              homogeneous               +----------+--------+--------+--------+-----------+--------------+ ICA Prox  80      17      1-39%   homogeneous               +----------+--------+--------+--------+-----------+--------------+ ICA Distal                                   Not visualized  +----------+--------+--------+--------+-----------+--------------+ ECA       63      9                                         +----------+--------+--------+--------+-----------+--------------+ +----------+--------+--------+--------+-------------------+ SubclavianPSV cm/sEDV cm/sDescribeArm Pressure (mmHG) +----------+--------+--------+--------+-------------------+           131                                         +----------+--------+--------+--------+-------------------+ +---------+--------+--+--------+--+---------+ VertebralPSV cm/s63EDV cm/s13Antegrade +---------+--------+--+--------+--+---------+  Summary: Right Carotid: Velocities in the right ICA are consistent with a 1-39% stenosis. Left Carotid: Velocities in the left ICA are consistent with a 1-39% stenosis. Vertebrals: Left vertebral artery demonstrates antegrade flow. Right vertebral             artery was not visualized. *See table(s) above for measurements and observations.  Electronically signed by Antony Contras MD on 01/27/2018 at 2:07:58 PM.    Final    2D echocardiogram  - Left ventricle: The cavity size was normal. Wall thickness was increased in a pattern of mild LVH. Systolic function was normal. The estimated ejection fraction was in the range of 55% to 60%. There is hypokinesis of the inferolateral myocardium. Doppler parameters are consistent with abnormal left ventricular relaxation (grade 1 diastolic dysfunction). - Aortic valve: Noncoronary cusp mobility was restricted. There was mild regurgitation. - Mitral valve: Calcified annulus. Impressions:  Mild hypokinesis of the inferolateral wall with overall preserved LV systolic function; mild diastolic dysfuction; mild DJT:[id number] aortic valve with mild AI,   PHYSICAL EXAM Pleasant middle aged african [place] male not in distress. . Afebrile. Head is nontraumatic. Neck is supple without bruit.    Cardiac exam no murmur or gallop. Lungs are clear to  auscultation. Distal pulses are well felt. Neurological Exam ;  Awake  Alert oriented x 3. Normal speech and language.eye movements full without nystagmus.fundi were not visualized. Vision acuity and fields appear normal. Hearing is normal. Palatal movements are normal. Face symmetric. Tongue midline. Normal strength, tone, reflexes and coordination. Normal sensation. Gait deferred.  ASSESSMENT/PLAN [name] is a 58 y.o. male with history of diabetes and permanent pacemaker presenting with dizziness and unstable gait.    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  Moderate to severe atrophy.  ASPECTS 10.     MRI left posterior pontine infarct. old bilateral cerebellar and thalamic infarcts. Small vessel disease. Atrophy.   MRA motion artifact.  No flow seen RVA.  Carotid Doppler  B ICA 1-39% stenosis, LVA antegrade, RVA not seen  2D Echo EF 55 to 60%.  No source of embolus.  LDL 84  HgbA1c 7.3  Lovenox 40 mg sq daily for VTE prophylaxis  No antithrombotic prior to admission, now on aspirin 325 mg daily and clopidogrel 75 mg daily. Given mild stroke, recommend aspirin 81 mg and plavix 75 mg daily x 3 weeks, then aspirin alone. Orders adjusted.   Therapy recommendations:  OP PT, RW 5" wheels  Disposition:  pending   Hypertensive urgency  Blood pressure as high as 200/100 in the ED   Stable today . Permissive hypertension (OK if < 220/120) but gradually normalize in 5-7 days . Long-term BP goal normotensive  Hyperlipidemia  Home meds:  No statin  LDL 84, goal < 70  Continue statin at discharge  Diabetes type II  HgbA1c 7.3, goal < 7.0  Uncontrolled  Other Stroke Risk Factors  Cigarette smoker, advised to stop smoking  Overweight, Body mass index is 28.18 kg/m., recommend weight loss, diet and exercise as appropriate   Other Active Problems  Hypokalemia  K3.3   NOTHING FURTHER TO ADD FROM THE STROKE STANDPOINT  Patient has a 10-15% risk of having another stroke over the next year, the highest risk is within 2 weeks of the most recent stroke/TIA (risk of having a stroke following a stroke or TIA is the same).  Ongoing risk factor control by Primary Care Physician  Stroke Service will sign off. Please call should any needs arise.  Follow-up Stroke Clinic at [facility] in 4 weeks, order placed.  Hospital day # 1  Burnetta Sabin, MSN, APRN, ANVP-BC, AGPCNP-BC Advanced Practice Stroke Nurse [place] for Schedule & Pager information 01/28/2018 2:03 PM  I have personally obtained history,examined this patient, reviewed notes, independently viewed imaging studies, participated in medical decision making and plan of care.ROS completed by me personally and pertinent positives fully documented  I have made any additions or clarifications directly to the above note. Agree with note above. He presented with paresthesias and dizziness  unsteady gait secondary to small thalamic lacunar infarct from sma Recommend Detected therapy for 3 week modification. Greater than 50% time during this 35 minute visit was spent on counseling and coordination of care about his lacunar stroke and discussion about about stroke risk factors and need for aggressive risk factor modification.  Antony Contras, MD Medical Director [place] Pager: [phone] 01/28/2018 3:51 PM  To contact Stroke Continuity provider, please refer to [email or web address] After hours, contact General Neurology
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
## 2018-01-28 NOTE — Plan of Care (Signed)
  Problem: Education: Goal: Knowledge of disease or condition will improve Outcome: Progressing Goal: Knowledge of secondary prevention will improve Outcome: Progressing Goal: Knowledge of patient specific risk factors addressed and post discharge goals established will improve Outcome: Progressing   Problem: Coping: Goal: Will identify appropriate support needs Outcome: Progressing   Problem: Health Behavior/Discharge Planning: Goal: Ability to manage health-related needs will improve Outcome: Progressing   Problem: Self-Care: Goal: Ability to participate in self-care as condition permits will improve Outcome: Progressing   Problem: Ischemic Stroke/TIA Tissue Perfusion: Goal: Complications of ischemic stroke/TIA will be minimized Outcome: Progressing

## 2018-01-28 NOTE — Progress Notes (Signed)
Stroke book given and reviewed with patient

## 2018-01-28 NOTE — Progress Notes (Signed)
Physical Therapy Treatment Patient Details Name: [name] MRN: [id number] DOB: 1961-01-06 Today's Date: 01/28/2018    History of Present Illness 58 y.o. male with medical history significant for type 2 diabetes mellitus,, now presenting to the emergency department for evaluation of off balance gait.  CT showed age indeterminate lacunar infarct involving the left thalamus    PT Comments    Patient progressing with balance and ambulation.  Remains high fall risk with Berg balance assessment score 32/56 (less than 45 demonstrates fall risk).  Utilized walker safely for ambulation to decrease fall risk.  Feel continued skilled PT indicated prior to d/c and likely most appropriate for outpatient PT.    Follow Up Recommendations  Outpatient PT;Supervision - Intermittent     Equipment Recommendations  Rolling walker with 5" wheels    Recommendations for Other Services       Precautions / Restrictions Precautions Precautions: Fall Restrictions Weight Bearing Restrictions: No    Mobility  Bed Mobility Overal bed mobility: Independent                Transfers Overall transfer level: Needs assistance Equipment used: Rolling walker (2 wheeled) Transfers: Sit to/from Stand Sit to Stand: Min guard            Ambulation/Gait Ambulation/Gait assistance: Min Gaffer (Feet): 250 Feet Assistive device: Rolling walker (2 wheeled) Gait Pattern/deviations: Step-through pattern;Decreased stride length         Stairs Stairs: Yes Stairs assistance: Supervision;Min guard Stair Management: Alternating pattern;Two rails;Forwards Number of Stairs: 4 General stair comments: 2 steps x 2   Wheelchair Mobility    Modified Rankin (Stroke Patients Only) Modified Rankin (Stroke Patients Only) Pre-Morbid Rankin Score: No symptoms Modified Rankin: Moderately severe disability     Balance Overall balance assessment: Needs assistance   Sitting  balance-Leahy Scale: Good       Standing balance-Leahy Scale: Poor                   Standardized Balance Assessment Standardized Balance Assessment : Berg Balance Test Berg Balance Test Sit to Stand: Able to stand  independently using hands Standing Unsupported: Able to stand safely 2 minutes Sitting with Back Unsupported but Feet Supported on Floor or Stool: Able to sit safely and securely 2 minutes Stand to Sit: Sits safely with minimal use of hands Transfers: Able to transfer safely, definite need of hands Standing Unsupported with Eyes Closed: Able to stand 10 seconds with supervision Standing Ubsupported with Feet Together: Needs help to attain position and unable to hold for 15 seconds From Standing, Reach Forward with Outstretched Arm: Can reach forward >12 cm safely (5") From Standing Position, Pick up Object from Floor: Able to pick up shoe, needs supervision From Standing Position, Turn to Look Behind Over each Shoulder: Needs supervision when turning Turn 360 Degrees: Needs close supervision or verbal cueing Standing Unsupported, Alternately Place Feet on Step/Stool: Able to complete >2 steps/needs minimal assist Standing Unsupported, One Foot in Front: Needs help to step but can hold 15 seconds Standing on One Leg: Tries to lift leg/unable to hold 3 seconds but remains standing independently Total Score: 32        Cognition Arousal/Alertness: Awake/alert Behavior During Therapy: WFL for tasks assessed/performed Overall Cognitive Status: Within Functional Limits for tasks assessed  Exercises      General Comments        Pertinent Vitals/Pain Pain Assessment: No/denies pain    Home Living                      Prior Function            PT Goals (current goals can now be found in the care plan section) Progress towards PT goals: Progressing toward goals    Frequency    Min  4X/week      PT Plan Current plan remains appropriate    Co-evaluation              AM-PAC PT "6 Clicks" Mobility   Outcome Measure  Help needed turning from your back to your side while in a flat bed without using bedrails?: None Help needed moving from lying on your back to sitting on the side of a flat bed without using bedrails?: None Help needed moving to and from a bed to a chair (including a wheelchair)?: A Little Help needed standing up from a chair using your arms (e.g., wheelchair or bedside chair)?: A Little Help needed to walk in hospital room?: A Little Help needed climbing 3-5 steps with a railing? : A Little 6 Click Score: 20    End of Session Equipment Utilized During Treatment: Gait belt Activity Tolerance: Patient tolerated treatment well Patient left: in bed;with call bell/phone within reach;with bed alarm set         Time: 1191-4782 PT Time Calculation (min) (ACUTE ONLY): 20 min  Charges:  $Gait Training: 8-22 mins                     Magda Kiel, [place] [phone] 01/28/2018    Reginia Naas 01/28/2018, 10:49 AM

## 2018-01-28 NOTE — Care Management Note (Signed)
Case Management Note  Patient Details  Name: [name] MRN: [id number] Date of Birth: 12-09-1960  Subjective/Objective:  Pt admitted with a  Stroke. He is from home with brother. Pt states his brother can provide supervision at home. DME: none Denies issues with obtaining home medications. Denies issues with transportation.                  Action/Plan: CM consulted for outpatient therapy. CM provided choice and [facility] selected. Orders in Epic and information on the AVS.  Pt with orders for walker and 3 in 1. James with [facility] DME notified and delivered to the room. Pt has transportation home.   Expected Discharge Date:  01/28/18               Expected Discharge Plan:  OP Rehab  In-House Referral:     Discharge planning Services  CM Consult  Post Acute Care Choice:  Durable Medical Equipment Choice offered to:  Patient  DME Arranged:  3-N-1, Walker rolling DME Agency:  [place]:    [place]:     Status of Service:  Completed, signed off  If discussed at [place] of Stay Meetings, dates discussed:    Additional Comments:  Pollie Friar, RN 01/28/2018, 4:18 PM

## 2018-01-31 ENCOUNTER — Other Ambulatory Visit: Payer: Self-pay

## 2018-01-31 NOTE — Patient Outreach (Signed)
[facility]) Care Management  01/31/2018  [name] December 14, 1960 [id number]   EMMI: stroke red alert Referral date: 01/31/18 Referral reason: Scheduled follow up appointment: no,  Problems setting up rehab: yes Insurance: [employer] / [employer] Day # 1  Telephone call to patient regarding EMMI stroke red alert. HIPAA verified with patient. Explained reason for call.  Patient states he saw his primary MD on yesterday and has a follow up appointment next week 02/06/17 for blood work. Patient states he does not have physical therapy scheduled at this time.  Patient states his doctor didn't say anything to him about therapy.  He states he has not heard from [facility]. Patient states he is not sure if he needs therapy because he is not having any problems. Patient states, "my doctor did some walking test with me and checked me out."   RNCM advised patient to call his primary MD to inquire if she feels he needs therapy at this time. Patient verbalized understanding.  Patient states he has transportation to his appointment. Patient states he is taking his medications as prescribed.  Patient reports he is taking the blood thinner, plavix.  RNCM advised patient of signs/ symptoms of bleeding. Advised patient to contact his doctor for these symptoms.  RMCM reviewed signs/ symptoms of stroke. Advised patient that 911 should be call for stroke like symptoms.  Patient denies having any new symptoms since discharge from the hospital. Patient denies any further needs/ concerns.   PLAN: RNCM will close patient due to patient being assessed and having no further needs.   Quinn Plowman RN,BSN,CCM [facility] Telephonic  [phone]

## 2018-02-02 DIAGNOSIS — E538 Deficiency of other specified B group vitamins: Secondary | ICD-10-CM | POA: Insufficient documentation

## 2018-09-14 ENCOUNTER — Inpatient Hospital Stay (HOSPITAL_COMMUNITY)
Admission: EM | Admit: 2018-09-14 | Discharge: 2018-09-19 | DRG: 101 | Disposition: A | Discharge status: Home Health | Payer: Self-pay | Attending: Internal Medicine | Admitting: Internal Medicine

## 2018-09-14 ENCOUNTER — Encounter (HOSPITAL_COMMUNITY): Payer: Self-pay | Admitting: Emergency Medicine

## 2018-09-14 ENCOUNTER — Emergency Department (HOSPITAL_COMMUNITY): Payer: Self-pay

## 2018-09-14 ENCOUNTER — Other Ambulatory Visit: Payer: Self-pay

## 2018-09-14 DIAGNOSIS — E876 Hypokalemia: Secondary | ICD-10-CM | POA: Diagnosis not present

## 2018-09-14 DIAGNOSIS — G40909 Epilepsy, unspecified, not intractable, without status epilepticus: Principal | ICD-10-CM | POA: Diagnosis present

## 2018-09-14 DIAGNOSIS — R531 Weakness: Secondary | ICD-10-CM

## 2018-09-14 DIAGNOSIS — D696 Thrombocytopenia, unspecified: Secondary | ICD-10-CM | POA: Diagnosis not present

## 2018-09-14 DIAGNOSIS — Z20828 Contact with and (suspected) exposure to other viral communicable diseases: Secondary | ICD-10-CM | POA: Diagnosis present

## 2018-09-14 DIAGNOSIS — Z7982 Long term (current) use of aspirin: Secondary | ICD-10-CM

## 2018-09-14 DIAGNOSIS — Z8673 Personal history of transient ischemic attack (TIA), and cerebral infarction without residual deficits: Secondary | ICD-10-CM

## 2018-09-14 DIAGNOSIS — E785 Hyperlipidemia, unspecified: Secondary | ICD-10-CM | POA: Diagnosis present

## 2018-09-14 DIAGNOSIS — E1165 Type 2 diabetes mellitus with hyperglycemia: Secondary | ICD-10-CM

## 2018-09-14 DIAGNOSIS — E871 Hypo-osmolality and hyponatremia: Secondary | ICD-10-CM | POA: Diagnosis not present

## 2018-09-14 DIAGNOSIS — I639 Cerebral infarction, unspecified: Secondary | ICD-10-CM | POA: Diagnosis present

## 2018-09-14 DIAGNOSIS — E119 Type 2 diabetes mellitus without complications: Secondary | ICD-10-CM

## 2018-09-14 DIAGNOSIS — E1151 Type 2 diabetes mellitus with diabetic peripheral angiopathy without gangrene: Secondary | ICD-10-CM | POA: Diagnosis present

## 2018-09-14 DIAGNOSIS — T17908A Unspecified foreign body in respiratory tract, part unspecified causing other injury, initial encounter: Secondary | ICD-10-CM

## 2018-09-14 DIAGNOSIS — Z7902 Long term (current) use of antithrombotics/antiplatelets: Secondary | ICD-10-CM

## 2018-09-14 DIAGNOSIS — R29707 NIHSS score 7: Secondary | ICD-10-CM | POA: Diagnosis present

## 2018-09-14 DIAGNOSIS — I10 Essential (primary) hypertension: Secondary | ICD-10-CM | POA: Diagnosis present

## 2018-09-14 DIAGNOSIS — I70209 Unspecified atherosclerosis of native arteries of extremities, unspecified extremity: Secondary | ICD-10-CM | POA: Diagnosis present

## 2018-09-14 DIAGNOSIS — R4701 Aphasia: Secondary | ICD-10-CM | POA: Diagnosis present

## 2018-09-14 DIAGNOSIS — F17210 Nicotine dependence, cigarettes, uncomplicated: Secondary | ICD-10-CM | POA: Diagnosis present

## 2018-09-14 DIAGNOSIS — R471 Dysarthria and anarthria: Secondary | ICD-10-CM | POA: Diagnosis present

## 2018-09-14 DIAGNOSIS — G459 Transient cerebral ischemic attack, unspecified: Secondary | ICD-10-CM

## 2018-09-14 DIAGNOSIS — Z833 Family history of diabetes mellitus: Secondary | ICD-10-CM

## 2018-09-14 LAB — URINALYSIS, ROUTINE W REFLEX MICROSCOPIC
Bacteria, UA: NONE SEEN
Bilirubin Urine: NEGATIVE
Glucose, UA: 50 mg/dL — AB
Hgb urine dipstick: NEGATIVE
Ketones, ur: NEGATIVE mg/dL
Leukocytes,Ua: NEGATIVE
Nitrite: NEGATIVE
Protein, ur: 30 mg/dL — AB
Specific Gravity, Urine: >1.046 — ABNORMAL HIGH (ref 1.005–1.030)
pH: 6 (ref 5.0–8.0)

## 2018-09-14 LAB — AMMONIA: Ammonia: 10 umol/L (ref 9–35)

## 2018-09-14 LAB — RAPID URINE DRUG SCREEN, HOSP PERFORMED
Amphetamines: NOT DETECTED
Barbiturates: NOT DETECTED
Benzodiazepines: NOT DETECTED
Cocaine: NOT DETECTED
Opiates: NOT DETECTED
Tetrahydrocannabinol: NOT DETECTED

## 2018-09-14 LAB — GLUCOSE, CAPILLARY
Glucose-Capillary: 113 mg/dL — ABNORMAL HIGH (ref 70–99)
Glucose-Capillary: 121 mg/dL — ABNORMAL HIGH (ref 70–99)

## 2018-09-14 LAB — CBC
HCT: 40.4 % (ref 39.0–52.0)
Hemoglobin: 13.4 g/dL (ref 13.0–17.0)
MCH: 29.6 pg (ref 26.0–34.0)
MCHC: 33.2 g/dL (ref 30.0–36.0)
MCV: 89.2 fL (ref 80.0–100.0)
Platelets: 157 10*3/uL (ref 150–400)
RBC: 4.53 MIL/uL (ref 4.22–5.81)
RDW: 13 % (ref 11.5–15.5)
WBC: 6.7 10*3/uL (ref 4.0–10.5)
nRBC: 0 % (ref 0.0–0.2)

## 2018-09-14 LAB — COMPREHENSIVE METABOLIC PANEL
ALT: 26 U/L (ref 0–44)
AST: 23 U/L (ref 15–41)
Albumin: 4 g/dL (ref 3.5–5.0)
Alkaline Phosphatase: 76 U/L (ref 38–126)
Anion gap: 14 (ref 5–15)
BUN: 8 mg/dL (ref 6–20)
CO2: 20 mmol/L — ABNORMAL LOW (ref 22–32)
Calcium: 8.9 mg/dL (ref 8.9–10.3)
Chloride: 98 mmol/L (ref 98–111)
Creatinine, Ser: 0.8 mg/dL (ref 0.61–1.24)
GFR calc Af Amer: >60 mL/min (ref >60)
GFR calc non Af Amer: >60 mL/min (ref >60)
Glucose, Bld: 183 mg/dL — ABNORMAL HIGH (ref 70–99)
Potassium: 3.4 mmol/L — ABNORMAL LOW (ref 3.5–5.1)
Sodium: 132 mmol/L — ABNORMAL LOW (ref 135–145)
Total Bilirubin: 1.2 mg/dL (ref 0.3–1.2)
Total Protein: 7.7 g/dL (ref 6.5–8.1)

## 2018-09-14 LAB — DIFFERENTIAL
Abs Immature Granulocytes: 0.03 10*3/uL (ref 0.00–0.07)
Basophils Absolute: 0 10*3/uL (ref 0.0–0.1)
Basophils Relative: 0 %
Eosinophils Absolute: 0 10*3/uL (ref 0.0–0.5)
Eosinophils Relative: 0 %
Immature Granulocytes: 1 %
Lymphocytes Relative: 14 %
Lymphs Abs: 0.9 10*3/uL (ref 0.7–4.0)
Monocytes Absolute: 0.3 10*3/uL (ref 0.1–1.0)
Monocytes Relative: 5 %
Neutro Abs: 5.3 10*3/uL (ref 1.7–7.7)
Neutrophils Relative %: 80 %

## 2018-09-14 LAB — CBG MONITORING, ED: Glucose-Capillary: 172 mg/dL — ABNORMAL HIGH (ref 70–99)

## 2018-09-14 LAB — HEMOGLOBIN A1C
Hgb A1c MFr Bld: 7.3 % — ABNORMAL HIGH (ref 4.8–5.6)
Mean Plasma Glucose: 162.81 mg/dL

## 2018-09-14 LAB — I-STAT CHEM 8, ED
BUN: 10 mg/dL (ref 6–20)
Calcium, Ion: 0.99 mmol/L — ABNORMAL LOW (ref 1.15–1.40)
Chloride: 97 mmol/L — ABNORMAL LOW (ref 98–111)
Creatinine, Ser: 0.7 mg/dL (ref 0.61–1.24)
Glucose, Bld: 185 mg/dL — ABNORMAL HIGH (ref 70–99)
HCT: 42 % (ref 39.0–52.0)
Hemoglobin: 14.3 g/dL (ref 13.0–17.0)
Potassium: 3.3 mmol/L — ABNORMAL LOW (ref 3.5–5.1)
Sodium: 134 mmol/L — ABNORMAL LOW (ref 135–145)
TCO2: 23 mmol/L (ref 22–32)

## 2018-09-14 LAB — APTT: aPTT: 31 seconds (ref 24–36)

## 2018-09-14 LAB — ETHANOL: Alcohol, Ethyl (B): <10 mg/dL (ref <10)

## 2018-09-14 LAB — PROTIME-INR
INR: 1.1 (ref 0.8–1.2)
Prothrombin Time: 14 seconds (ref 11.4–15.2)

## 2018-09-14 MED ORDER — IOHEXOL 350 MG/ML SOLN
100.0000 mL | Freq: Once | INTRAVENOUS | Status: AC | PRN
  Administered 2018-09-14: 13:00:00 100 mL via INTRAVENOUS

## 2018-09-14 MED ORDER — STROKE: EARLY STAGES OF RECOVERY BOOK
Freq: Once | Status: AC
  Administered 2018-09-14: 18:00:00
  Filled 2018-09-14: qty 1

## 2018-09-14 MED ORDER — INSULIN ASPART 100 UNIT/ML ~~LOC~~ SOLN
0.0000 [IU] | SUBCUTANEOUS | Status: DC
  Administered 2018-09-14 – 2018-09-16 (×5): 1 [IU] via SUBCUTANEOUS
  Administered 2018-09-16: 20:00:00 2 [IU] via SUBCUTANEOUS
  Administered 2018-09-16: 17:00:00 1 [IU] via SUBCUTANEOUS
  Administered 2018-09-17: 20:00:00 2 [IU] via SUBCUTANEOUS
  Administered 2018-09-17: 01:00:00 1 [IU] via SUBCUTANEOUS
  Administered 2018-09-17: 13:00:00 3 [IU] via SUBCUTANEOUS
  Administered 2018-09-17 – 2018-09-18 (×2): 1 [IU] via SUBCUTANEOUS
  Administered 2018-09-18: 21:00:00 2 [IU] via SUBCUTANEOUS
  Administered 2018-09-18: 02:00:00 1 [IU] via SUBCUTANEOUS
  Administered 2018-09-18 (×2): 2 [IU] via SUBCUTANEOUS
  Administered 2018-09-19: 12:00:00 3 [IU] via SUBCUTANEOUS
  Administered 2018-09-19: 01:00:00 1 [IU] via SUBCUTANEOUS

## 2018-09-14 MED ORDER — HEPARIN SODIUM (PORCINE) 5000 UNIT/ML IJ SOLN
5000.0000 [IU] | Freq: Three times a day (TID) | INTRAMUSCULAR | Status: DC
  Administered 2018-09-14 – 2018-09-19 (×14): 5000 [IU] via SUBCUTANEOUS
  Filled 2018-09-14 (×14): qty 1

## 2018-09-14 MED ORDER — CLOPIDOGREL BISULFATE 75 MG PO TABS
75.0000 mg | ORAL_TABLET | Freq: Every day | ORAL | Status: DC
  Administered 2018-09-15 – 2018-09-19 (×5): 75 mg via ORAL
  Filled 2018-09-14 (×5): qty 1

## 2018-09-14 MED ORDER — SODIUM CHLORIDE 0.9 % IV SOLN
INTRAVENOUS | Status: DC
  Administered 2018-09-14 – 2018-09-18 (×4): via INTRAVENOUS

## 2018-09-14 MED ORDER — ASPIRIN EC 81 MG PO TBEC
81.0000 mg | DELAYED_RELEASE_TABLET | Freq: Every day | ORAL | Status: DC
  Administered 2018-09-15 – 2018-09-19 (×5): 81 mg via ORAL
  Filled 2018-09-14 (×5): qty 1

## 2018-09-14 MED ORDER — ACETAMINOPHEN 160 MG/5ML PO SOLN: 650.0000 mg | ORAL | Status: DC | PRN

## 2018-09-14 MED ORDER — ATORVASTATIN CALCIUM 40 MG PO TABS
40.0000 mg | ORAL_TABLET | Freq: Every day | ORAL | Status: DC
  Administered 2018-09-14 – 2018-09-18 (×4): 40 mg via ORAL
  Filled 2018-09-14 (×4): qty 1

## 2018-09-14 MED ORDER — ACETAMINOPHEN 650 MG RE SUPP: 650.0000 mg | RECTAL | Status: DC | PRN

## 2018-09-14 MED ORDER — ASPIRIN 81 MG PO CHEW
324.0000 mg | CHEWABLE_TABLET | Freq: Once | ORAL | Status: AC
  Administered 2018-09-14: 14:00:00 324 mg via ORAL
  Filled 2018-09-14: qty 4

## 2018-09-14 MED ORDER — ACETAMINOPHEN 325 MG PO TABS
650.0000 mg | ORAL_TABLET | ORAL | Status: DC | PRN
  Filled 2018-09-14: qty 2
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
## 2018-09-14 NOTE — ED Notes (Signed)
Pt CBG 172. Notified RN.

## 2018-09-14 NOTE — ED Notes (Signed)
Dr. Lorraine Lax at pt bedside.

## 2018-09-14 NOTE — Progress Notes (Signed)
New Admission Note:  Arrival Method: via stretcher from ED  Mental Orientation: Oriented to self only  Telemetry: 3w31 Assessment: Completed Skin: Pt missing R great toe amputated, abrasion on R hand  IV: L forearm.  Pain: 0/10 Safety Measures: Safety Fall Prevention Plan was given, discussed. [id number]: Patient has been orientated to the room, unit and the staff. Family: None at the bedside.   PT had not voided in ER since admit. RN did a bladder scan and was 634 ML. IN & OUT was done and results were 722ml   Orders have been reviewed and implemented. Will continue to monitor the patient. Call light has been placed within reach and bed alarm has been activated.   Arta Silence ,RN
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
## 2018-09-14 NOTE — H&P (Signed)
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 Orders  Family Communication: Admission, patients condition and plan of care including tests being ordered have been discussed with the patient  who indicate understanding and agree with the plan and Code Status.  Code Status   Likely DC to home  Condition GUARDED   Consults called: Neurology  Admission status: Observation  Time spent in minutes : 60 minutes   Phillips Climes M.D on 09/14/2018 at 2:32 PM  Between 7am to 7pm - Pager - [phone]. After 7pm go to [email or web address] - password [facility]  [facility] - Office  [phone]

## 2018-09-14 NOTE — ED Notes (Signed)
Pt's nephew Wray Kearns: [phone]; Mr. Toney Rakes stated please call with updates.

## 2018-09-14 NOTE — ED Triage Notes (Signed)
Pt from home with  LKW 1300 yesterday (09/13/2018) by pt's brother. Brother discovered pt upstairs leaning to right.  EMS noted patient weak on right side as well.  Brother states pt had a stroke about 6 months ago - unclear if there were any deficits. Pt has a HX of DM, HTN, and hyper cholesteremia.   EMS Vitals:  BP 156/82 HR 78 Sp02 96% RA  CBG 174

## 2018-09-14 NOTE — ED Notes (Signed)
Pt returned to room from CT

## 2018-09-14 NOTE — ED Notes (Signed)
ED Provider at bedside. 

## 2018-09-14 NOTE — ED Notes (Signed)
Pt informed of need for urine sample. Placed condom catheter on pt. Pt resting in bed at this time.

## 2018-09-14 NOTE — ED Notes (Signed)
Patient transported to CT 

## 2018-09-14 NOTE — ED Provider Notes (Signed)
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 Discharge Orders    None       Frederica Kuster, PA-C 09/14/18 1515    Lajean Saver, MD 09/14/18 1549

## 2018-09-15 ENCOUNTER — Observation Stay (HOSPITAL_BASED_OUTPATIENT_CLINIC_OR_DEPARTMENT_OTHER): Payer: Self-pay

## 2018-09-15 ENCOUNTER — Observation Stay (HOSPITAL_COMMUNITY): Payer: Self-pay

## 2018-09-15 DIAGNOSIS — T17908A Unspecified foreign body in respiratory tract, part unspecified causing other injury, initial encounter: Secondary | ICD-10-CM

## 2018-09-15 DIAGNOSIS — R569 Unspecified convulsions: Secondary | ICD-10-CM

## 2018-09-15 DIAGNOSIS — I351 Nonrheumatic aortic (valve) insufficiency: Secondary | ICD-10-CM

## 2018-09-15 DIAGNOSIS — Z8673 Personal history of transient ischemic attack (TIA), and cerebral infarction without residual deficits: Secondary | ICD-10-CM

## 2018-09-15 LAB — CBC
HCT: 38.1 % — ABNORMAL LOW (ref 39.0–52.0)
Hemoglobin: 12.7 g/dL — ABNORMAL LOW (ref 13.0–17.0)
MCH: 29.5 pg (ref 26.0–34.0)
MCHC: 33.3 g/dL (ref 30.0–36.0)
MCV: 88.4 fL (ref 80.0–100.0)
Platelets: 145 10*3/uL — ABNORMAL LOW (ref 150–400)
RBC: 4.31 MIL/uL (ref 4.22–5.81)
RDW: 12.9 % (ref 11.5–15.5)
WBC: 6 10*3/uL (ref 4.0–10.5)
nRBC: 0 % (ref 0.0–0.2)

## 2018-09-15 LAB — GLUCOSE, CAPILLARY
Glucose-Capillary: 118 mg/dL — ABNORMAL HIGH (ref 70–99)
Glucose-Capillary: 116 mg/dL — ABNORMAL HIGH (ref 70–99)
Glucose-Capillary: 109 mg/dL — ABNORMAL HIGH (ref 70–99)
Glucose-Capillary: 150 mg/dL — ABNORMAL HIGH (ref 70–99)
Glucose-Capillary: 129 mg/dL — ABNORMAL HIGH (ref 70–99)
Glucose-Capillary: 129 mg/dL — ABNORMAL HIGH (ref 70–99)

## 2018-09-15 LAB — LIPID PANEL
Cholesterol: 93 mg/dL (ref 0–200)
HDL: 39 mg/dL — ABNORMAL LOW (ref >40)
LDL Cholesterol: 41 mg/dL (ref 0–99)
Total CHOL/HDL Ratio: 2.4 RATIO
Triglycerides: 65 mg/dL (ref <150)
VLDL: 13 mg/dL (ref 0–40)

## 2018-09-15 LAB — BASIC METABOLIC PANEL
Anion gap: 10 (ref 5–15)
BUN: 8 mg/dL (ref 6–20)
CO2: 24 mmol/L (ref 22–32)
Calcium: 8.6 mg/dL — ABNORMAL LOW (ref 8.9–10.3)
Chloride: 98 mmol/L (ref 98–111)
Creatinine, Ser: 0.87 mg/dL (ref 0.61–1.24)
GFR calc Af Amer: >60 mL/min (ref >60)
GFR calc non Af Amer: >60 mL/min (ref >60)
Glucose, Bld: 103 mg/dL — ABNORMAL HIGH (ref 70–99)
Potassium: 3.2 mmol/L — ABNORMAL LOW (ref 3.5–5.1)
Sodium: 132 mmol/L — ABNORMAL LOW (ref 135–145)

## 2018-09-15 LAB — NOVEL CORONAVIRUS, NAA (HOSP ORDER, SEND-OUT TO REF LAB; TAT 18-24 HRS): SARS-CoV-2, NAA: NOT DETECTED

## 2018-09-15 LAB — ECHOCARDIOGRAM COMPLETE

## 2018-09-15 MED ORDER — LEVETIRACETAM IN NACL 500 MG/100ML IV SOLN
500.0000 mg | Freq: Two times a day (BID) | INTRAVENOUS | Status: DC
  Administered 2018-09-16 – 2018-09-19 (×7): 500 mg via INTRAVENOUS
  Filled 2018-09-15 (×7): qty 100

## 2018-09-15 MED ORDER — LORAZEPAM 2 MG/ML IJ SOLN
2.0000 mg | Freq: Once | INTRAMUSCULAR | Status: AC
  Administered 2018-09-15: 16:00:00 2 mg via INTRAVENOUS

## 2018-09-15 MED ORDER — POTASSIUM CHLORIDE CRYS ER 20 MEQ PO TBCR
40.0000 meq | EXTENDED_RELEASE_TABLET | ORAL | Status: AC
  Administered 2018-09-15 (×2): 40 meq via ORAL
  Filled 2018-09-15 (×2): qty 2

## 2018-09-15 MED ORDER — LEVETIRACETAM IN NACL 1500 MG/100ML IV SOLN
1500.0000 mg | Freq: Once | INTRAVENOUS | Status: AC
  Administered 2018-09-15: 18:00:00 1500 mg via INTRAVENOUS
  Filled 2018-09-15 (×2): qty 100

## 2018-09-15 MED ORDER — LORAZEPAM 2 MG/ML IJ SOLN
INTRAMUSCULAR | Status: AC
  Filled 2018-09-15: qty 1

## 2018-09-15 NOTE — Progress Notes (Signed)
Dr Doristine Bosworth is notified that pt has not voided for 10 hours. Bladder scan showed below 200. Pt denies feeling of urgency and fullness. Order received to do in and out cath when bladder scan shows over 550 ml. Continue to monitor the patient.

## 2018-09-15 NOTE — Progress Notes (Signed)
PROGRESS NOTE    Natasha Moix  [id number] DOB: [birth year] DOA: 09/14/2018 PCP: Glendon Axe, MD   Brief Narrative:  Symere Muise  is a 58 y.o. male, with past medical history significant for hypertension, hyperlipidemia, diabetes mellitus, recent admission and January 2020 for acute CVA. apparently patient was noted by his brother to be leaning to the left, patient had unsteady gait yesterday, he was last known normal was yesterday morning, brother found him in the bathroom this morning, he had fallen, patient denied any head trauma, he denies any pain or injuries, himself a poor historian.  - in ED patient was noted to have mild right-sided weakness, seen by neurology service who recommended admission for further work-up, CT head with no acute finding, CTA head and neck, significant for right vertebral artery occlusion, and area of ischemia in left frontal lobe, of unclear acuity, and received full dose aspirin in ED and was admitted to hospital service.  Assessment & Plan:   Active Problems:   Diabetes mellitus type II, non insulin dependent (HCC)   Stroke (HCC)   HTN (hypertension)   TIA (transient ischemic attack)   Right sided weakness   Acute encephalopathy/right-sided weakness/dysarthria: Patient is still significantly confused and was also lethargic.  He was not able to talk much with me.  Not able to understand and follow simple commands but was able to move all extremities spontaneously.  He is on aspirin and Plavix.  Interestingly, despite of evidence of acute ischemic noted on the CT cerebral perfusion, MRI is negative for any acute intracranial infarct.  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  Passed bedside swallow.  SLP, PT OT on board.  EEG pending.  Neurology on board.  Management per them.  Essential hypertension: Allowing permissive hypertension.  Continue to hold  amlodipine.  Hyperlipidemia: Continue atorvastatin 40 mg.  Type 2 diabetes mellitus: Blood sugar controlled.  Continue SSI.  Hypokalemia: Replace potassium.  DVT prophylaxis: Heparin Code Status: Full code Family Communication:  None present at bedside. Disposition Plan: To be determined.  Consultants:   Neurology  Procedures:   None  Antimicrobials:   None   Subjective: Patient seen and examined.  He was lethargic.  Not able to talk much or follow simple commands however he was moving all extremities spontaneously.  Seem to have significant aphasia.  Objective: Vitals:   09/15/18 0018 09/15/18 0402 09/15/18 0731 09/15/18 1206  BP: (!) 160/83 (!) 166/82 (!) 156/82 (!) 157/87  Pulse: 69 71 64 64  Resp: 18 18 16 20   Temp: 98.8 F (37.1 C) 100 F (37.8 C) 99.6 F (37.6 C) 99.5 F (37.5 C)  TempSrc: Oral Oral Oral Oral  SpO2: 100% 99% 98% 100%    Intake/Output Summary (Last 24 hours) at 09/15/2018 1507 Last data filed at 09/15/2018 0951 Gross per 24 hour  Intake 134.03 ml  Output 433 ml  Net -298.97 ml   There were no vitals filed for this visit.  Examination:  General exam: Lethargic Respiratory system: Clear to auscultation. Respiratory effort normal. Cardiovascular system: S1 & S2 heard, RRR. No JVD, murmurs, rubs, gallops or clicks. No pedal edema. Gastrointestinal system: Abdomen is nondistended, soft and nontender. No organomegaly or masses felt. Normal bowel sounds heard. Central nervous system: Lethargic but moving all extremities spontaneously.  Seemed to be aphasic. Extremities: Symmetric 5 x 5 power. Skin: No rashes, lesions or ulcers Psychiatry: Unable to assess due to lethargy.   Data  Reviewed: I have personally reviewed following labs and imaging studies  CBC: Recent Labs  Lab 09/14/18 1052 09/14/18 1100 09/15/18 0639  WBC 6.7  --  6.0  NEUTROABS 5.3  --   --   HGB 13.4 14.3 12.7*  HCT 40.4 42.0 38.1*  MCV 89.2  --  88.4  PLT 157   --  [id number]*   Basic Metabolic Panel: Recent Labs  Lab 09/14/18 1052 09/14/18 1100 09/15/18 0639  NA 132* 134* 132*  K 3.4* 3.3* 3.2*  CL 98 97* 98  CO2 20*  --  24  GLUCOSE 183* 185* 103*  BUN 8 10 8   CREATININE 0.80 0.70 0.87  CALCIUM 8.9  --  8.6*   GFR: CrCl cannot be calculated (Unknown ideal weight.). Liver Function Tests: Recent Labs  Lab 09/14/18 1052  AST 23  ALT 26  ALKPHOS 76  BILITOT 1.2  PROT 7.7  ALBUMIN 4.0   No results for input(s): LIPASE, AMYLASE in the last 168 hours. Recent Labs  Lab 09/14/18 1057  AMMONIA 10   Coagulation Profile: Recent Labs  Lab 09/14/18 1052  INR 1.1   Cardiac Enzymes: No results for input(s): CKTOTAL, CKMB, CKMBINDEX, TROPONINI in the last 168 hours. BNP (last 3 results) No results for input(s): PROBNP in the last 8760 hours. HbA1C: Recent Labs    09/14/18 1052  HGBA1C 7.3*   CBG: Recent Labs  Lab 09/14/18 2054 09/15/18 0105 09/15/18 0359 09/15/18 0733 09/15/18 1204  GLUCAP 121* 118* 116* 109* 150*   Lipid Profile: Recent Labs    09/15/18 0639  CHOL 93  HDL 39*  LDLCALC 41  TRIG 65  CHOLHDL 2.4   Thyroid Function Tests: No results for input(s): TSH, T4TOTAL, FREET4, T3FREE, THYROIDAB in the last 72 hours. Anemia Panel: No results for input(s): VITAMINB12, FOLATE, FERRITIN, TIBC, IRON, RETICCTPCT in the last 72 hours. Sepsis Labs: No results for input(s): PROCALCITON, LATICACIDVEN in the last 168 hours.  Recent Results (from the past 240 hour(s))  Novel Coronavirus, NAA (send-out to ref lab)     Status: None   Collection Time: 09/14/18  1:44 PM   Specimen: Nasopharyngeal Swab; Respiratory  Result Value Ref Range Status   SARS-CoV-2, NAA NOT DETECTED NOT DETECTED Final    Comment: (NOTE) This test was developed and its performance characteristics determined by [employer]. This test has not been FDA cleared or approved. This test has been authorized by FDA under an Emergency Use  Authorization (EUA). This test is only authorized for the duration of time the declaration that circumstances exist justifying the authorization of the emergency use of in vitro diagnostic tests for detection of SARS-CoV-2 virus and/or diagnosis of COVID-19 infection under section 564(b)(1) of the Act, 21 U.S.C. [id number]), unless the authorization is terminated or revoked sooner. When diagnostic testing is negative, the possibility of a false negative result should be considered in the context of a patient's recent exposures and the presence of clinical signs and symptoms consistent with COVID-19. An individual without symptoms of COVID-19 and who is not shedding SARS-CoV-2 virus would expect to have a negative (not detected) result in this assay. Performed  At: [facility] [address] [place], [place] [id number] Rush Farmer MD [id number]    [place]  Final    Comment: Performed at [facility], [place] [address], [place], [place] [address]      Radiology Studies: Ct Angio Head W Or Wo Contrast  Result Date: 09/14/2018 CLINICAL DATA:  Focal neuro deficit, > 6 hrs, stroke suspected R sided weakness; Focal neuro deficit, < 6 hrs, stroke suspected EXAM: CT ANGIOGRAPHY HEAD AND NECK CT PERFUSION BRAIN TECHNIQUE: Multidetector CT imaging of the head and neck was performed using the standard protocol during bolus administration of intravenous contrast. Multiplanar CT image reconstructions and MIPs were obtained to evaluate the vascular anatomy. Carotid stenosis measurements (when applicable) are obtained utilizing NASCET criteria, using the distal internal carotid diameter as the denominator. Multiphase CT imaging of the brain was performed following IV bolus contrast injection. Subsequent parametric perfusion maps were calculated using RAPID software. CONTRAST:  155mL OMNIPAQUE IOHEXOL 350 MG/ML SOLN COMPARISON:  CT head fifth without contrast  09/14/2018. MRI of the brain 01/27/2018. FINDINGS: CTA NECK FINDINGS Aortic arch: A 3 vessel arch configuration is present. Minimal calcifications are present at the origin of the innominate artery and along the undersurface of the arch. There is no significant stenosis or aneurysm. Right carotid system: The right common carotid artery is within normal limits. Bifurcation is normal. There is some tortuosity of the cervical right ICA without significant stenosis. Left carotid system: The left common carotid artery is tortuous proximally. The bifurcation is unremarkable. There is moderate tortuosity of the cervical left ICA without significant stenosis. Vertebral arteries: The left vertebral artery is hypoplastic. The right vertebral artery is occluded. There is no significant reconstitution of the right vertebral artery in the neck. Skeleton: There straightening of the normal cervical lordosis. No significant listhesis is present. Vertebral body heights are maintained. No focal lytic or blastic lesions are present. Multiple dental caries and periapical lucencies are present within residual mandibular teeth. Other neck: No focal mucosal or submucosal lesions are present. The submandibular and parotid glands and ducts are normal. Thyroid is unremarkable. No significant adenopathy is present. Upper chest: The lung apices are clear. Paraseptal emphysematous changes are noted. Thoracic inlet is normal. Review of the MIP images confirms the above findings CTA HEAD FINDINGS Anterior circulation: Atherosclerotic calcifications are present within the cavernous internal carotid arteries bilaterally. There is tortuosity and moderate stenosis involving the cavernous right internal carotid artery. The left internal carotid artery is within normal limits through the ICA terminus. The A1 and M1 segments are normal. The anterior communicating artery is patent. Right A1 segment is dominant. MCA bifurcations are within normal limits.  There is mild distal branch vessel narrowing involving ACA and MCA branch vessels. There is slight increased vascularity on the left, suggesting the possibility of luxury perfusion. Posterior circulation: The right vertebral artery is reconstituted at the V4 segment. PICA is opacified. A distal V4 stenosis is present. The left vertebral artery is hypoplastic but patent through the vertebrobasilar junction. The basilar artery is small. Posterior communicating arteries are patent bilaterally. The PCA branch vessels are unremarkable. Small P1 segments are noted bilaterally. Venous sinuses: Dural sinuses are patent. The straight sinus and deep cerebral veins are intact. Cortical veins are unremarkable. Anatomic variants: Prominent posterior communicating arteries bilaterally. Review of the MIP images confirms the above findings CT Brain Perfusion Findings: ASPECTS: 10/10 CBF (<30%) Volume: 34mL Perfusion (Tmax>6.0s) volume: 23mL Mismatch Volume: 88mL Infarction Location:Anterior left frontal lobe. IMPRESSION: 1. Area of ischemia in the anterior left frontal lobe. There is some bilateral increased T-max which is likely artifactual. 2. No significant core infarct. 3. Question luxury perfusion over the left hemisphere consistent with recent ischemia. 4. 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 5. The left vertebral artery is patent. There is flow  in the distal right V4 segment to the posterior communicating artery. 6. 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 7. Prominent posterior communicating arteries bilaterally. 8. No other significant proximal large vessel occlusion. 9. No significant carotid stenosis in the neck. There is mild tortuosity. 10. Minimal atherosclerotic changes at the aortic arch without significant stenosis. These results were called by telephone at the time of interpretation on 09/14/2018 at 1:49 pm to Dr. Samara Snide , who verbally acknowledged  these results. Electronically Signed   By: San Morelle M.D.   On: 09/14/2018 13:50   Ct Head Wo Contrast  Result Date: 09/14/2018 CLINICAL DATA:  Patient with right-sided weakness. EXAM: CT HEAD WITHOUT CONTRAST TECHNIQUE: Contiguous axial images were obtained from the base of the skull through the vertex without intravenous contrast. COMPARISON:  Brain CT 01/26/2018 FINDINGS: Brain: Ventricles are prominent compatible with atrophy. No evidence for acute cortically based infarct, intracranial hemorrhage, mass lesion or mass-effect. Old left thalamic lacunar infarct. Vascular: Unremarkable Skull: Intact Sinuses/Orbits: Paranasal sinuses are well aerated. Mastoid air cells are unremarkable. Orbits are unremarkable. Other: None. IMPRESSION: No acute intracranial process. Atrophy and chronic microvascular ischemic changes. Electronically Signed   By: Lovey Newcomer M.D.   On: 09/14/2018 12:08   Ct Angio Neck W And/or Wo Contrast  Result Date: 09/14/2018 CLINICAL DATA:  Focal neuro deficit, > 6 hrs, stroke suspected R sided weakness; Focal neuro deficit, < 6 hrs, stroke suspected EXAM: CT ANGIOGRAPHY HEAD AND NECK CT PERFUSION BRAIN TECHNIQUE: Multidetector CT imaging of the head and neck was performed using the standard protocol during bolus administration of intravenous contrast. Multiplanar CT image reconstructions and MIPs were obtained to evaluate the vascular anatomy. Carotid stenosis measurements (when applicable) are obtained utilizing NASCET criteria, using the distal internal carotid diameter as the denominator. Multiphase CT imaging of the brain was performed following IV bolus contrast injection. Subsequent parametric perfusion maps were calculated using RAPID software. CONTRAST:  165mL OMNIPAQUE IOHEXOL 350 MG/ML SOLN COMPARISON:  CT head fifth without contrast 09/14/2018. MRI of the brain 01/27/2018. FINDINGS: CTA NECK FINDINGS Aortic arch: A 3 vessel arch configuration is present. Minimal  calcifications are present at the origin of the innominate artery and along the undersurface of the arch. There is no significant stenosis or aneurysm. Right carotid system: The right common carotid artery is within normal limits. Bifurcation is normal. There is some tortuosity of the cervical right ICA without significant stenosis. Left carotid system: The left common carotid artery is tortuous proximally. The bifurcation is unremarkable. There is moderate tortuosity of the cervical left ICA without significant stenosis. Vertebral arteries: The left vertebral artery is hypoplastic. The right vertebral artery is occluded. There is no significant reconstitution of the right vertebral artery in the neck. Skeleton: There straightening of the normal cervical lordosis. No significant listhesis is present. Vertebral body heights are maintained. No focal lytic or blastic lesions are present. Multiple dental caries and periapical lucencies are present within residual mandibular teeth. Other neck: No focal mucosal or submucosal lesions are present. The submandibular and parotid glands and ducts are normal. Thyroid is unremarkable. No significant adenopathy is present. Upper chest: The lung apices are clear. Paraseptal emphysematous changes are noted. Thoracic inlet is normal. Review of the MIP images confirms the above findings CTA HEAD FINDINGS Anterior circulation: Atherosclerotic calcifications are present within the cavernous internal carotid arteries bilaterally. There is tortuosity and moderate stenosis involving the cavernous right internal carotid artery. The left internal carotid artery is  within normal limits through the ICA terminus. The A1 and M1 segments are normal. The anterior communicating artery is patent. Right A1 segment is dominant. MCA bifurcations are within normal limits. There is mild distal branch vessel narrowing involving ACA and MCA branch vessels. There is slight increased vascularity on the  left, suggesting the possibility of luxury perfusion. Posterior circulation: The right vertebral artery is reconstituted at the V4 segment. PICA is opacified. A distal V4 stenosis is present. The left vertebral artery is hypoplastic but patent through the vertebrobasilar junction. The basilar artery is small. Posterior communicating arteries are patent bilaterally. The PCA branch vessels are unremarkable. Small P1 segments are noted bilaterally. Venous sinuses: Dural sinuses are patent. The straight sinus and deep cerebral veins are intact. Cortical veins are unremarkable. Anatomic variants: Prominent posterior communicating arteries bilaterally. Review of the MIP images confirms the above findings CT Brain Perfusion Findings: ASPECTS: 10/10 CBF (<30%) Volume: 65mL Perfusion (Tmax>6.0s) volume: 46mL Mismatch Volume: 4mL Infarction Location:Anterior left frontal lobe. IMPRESSION: 1. Area of ischemia in the anterior left frontal lobe. There is some bilateral increased T-max which is likely artifactual. 2. No significant core infarct. 3. Question luxury perfusion over the left hemisphere consistent with recent ischemia. 4. 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 5. The left vertebral artery is patent. There is flow in the distal right V4 segment to the posterior communicating artery. 6. 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 7. Prominent posterior communicating arteries bilaterally. 8. No other significant proximal large vessel occlusion. 9. No significant carotid stenosis in the neck. There is mild tortuosity. 10. Minimal atherosclerotic changes at the aortic arch without significant stenosis. These results were called by telephone at the time of interpretation on 09/14/2018 at 1:49 pm to Dr. Samara Snide , who verbally acknowledged these results. Electronically Signed   By: San Morelle M.D.   On: 09/14/2018 13:50   Mr Brain Wo Contrast  Result  Date: 09/15/2018 CLINICAL DATA:  Initial evaluation for right-sided weakness, stroke suspected. EXAM: MRI HEAD WITHOUT CONTRAST TECHNIQUE: Multiplanar, multiecho pulse sequences of the brain and surrounding structures were obtained without intravenous contrast. COMPARISON:  Prior CT and CTA from 09/14/2018 as well as previous brain MRI from 01/27/2018. FINDINGS: Brain: Diffuse prominence of the CSF containing spaces compatible with generalized age-related cerebral atrophy. Patchy and confluent T2/FLAIR hyperintensity within the periventricular deep white matter both cerebral hemispheres most consistent with chronic small vessel ischemic disease, mild in nature. Superimposed remote lacunar infarcts seen involving the bilateral thalami, right caudate head, right paramedian pons, and bilateral cerebellar hemispheres. No abnormal foci of restricted diffusion to suggest acute or subacute ischemia. Specifically, no diffusion abnormality seen to correspond with previously noted perfusion abnormality on prior CT for fusion. Gray-white matter differentiation maintained. No evidence for acute or chronic intracranial hemorrhage. No mass lesion, midline shift or mass effect. Mild diffuse ventricular prominence related global parenchymal volume loss, stable from previous. No hydrocephalus. No extra-axial fluid collection. Pituitary gland suprasellar region normal. Vascular: Apparent loss of normal flow void within the left V4 segment (series 10, image 3), likely related to slow flow, as the left V4 segment appears patent on prior CTA. Visualized major intracranial vascular flow voids otherwise maintained. Skull and upper cervical spine: Craniocervical junction within normal limits. Upper cervical spine normal. Visualized bone marrow signal intensity diffusely decreased on T1 weighted imaging, nonspecific, but most commonly related to anemia, smoking, or obesity. No focal marrow replacing lesion. Scalp soft tissues  unremarkable.  Sinuses/Orbits: Globes and orbital soft tissues demonstrate no acute finding. Remote posttraumatic defect noted at the right lamina for pre shift. Mild scattered mucosal thickening throughout the paranasal sinuses. No air-fluid level to suggest acute sinusitis. No mastoid effusion. Inner ear structures grossly normal. Other: None. IMPRESSION: 1. No acute intracranial infarct or other abnormality identified. 2. Multiple scattered chronic lacunar infarcts involving the right caudate, bilateral thalami, pons, and bilateral cerebellar hemispheres. 3. Moderately advanced cerebral atrophy with mild chronic microvascular ischemic disease. Electronically Signed   By: Jeannine Boga M.D.   On: 09/15/2018 03:11   Ct Cerebral Perfusion W Contrast  Result Date: 09/14/2018 CLINICAL DATA:  Focal neuro deficit, > 6 hrs, stroke suspected R sided weakness; Focal neuro deficit, < 6 hrs, stroke suspected EXAM: CT ANGIOGRAPHY HEAD AND NECK CT PERFUSION BRAIN TECHNIQUE: Multidetector CT imaging of the head and neck was performed using the standard protocol during bolus administration of intravenous contrast. Multiplanar CT image reconstructions and MIPs were obtained to evaluate the vascular anatomy. Carotid stenosis measurements (when applicable) are obtained utilizing NASCET criteria, using the distal internal carotid diameter as the denominator. Multiphase CT imaging of the brain was performed following IV bolus contrast injection. Subsequent parametric perfusion maps were calculated using RAPID software. CONTRAST:  171mL OMNIPAQUE IOHEXOL 350 MG/ML SOLN COMPARISON:  CT head fifth without contrast 09/14/2018. MRI of the brain 01/27/2018. FINDINGS: CTA NECK FINDINGS Aortic arch: A 3 vessel arch configuration is present. Minimal calcifications are present at the origin of the innominate artery and along the undersurface of the arch. There is no significant stenosis or aneurysm. Right carotid system: The right common carotid  artery is within normal limits. Bifurcation is normal. There is some tortuosity of the cervical right ICA without significant stenosis. Left carotid system: The left common carotid artery is tortuous proximally. The bifurcation is unremarkable. There is moderate tortuosity of the cervical left ICA without significant stenosis. Vertebral arteries: The left vertebral artery is hypoplastic. The right vertebral artery is occluded. There is no significant reconstitution of the right vertebral artery in the neck. Skeleton: There straightening of the normal cervical lordosis. No significant listhesis is present. Vertebral body heights are maintained. No focal lytic or blastic lesions are present. Multiple dental caries and periapical lucencies are present within residual mandibular teeth. Other neck: No focal mucosal or submucosal lesions are present. The submandibular and parotid glands and ducts are normal. Thyroid is unremarkable. No significant adenopathy is present. Upper chest: The lung apices are clear. Paraseptal emphysematous changes are noted. Thoracic inlet is normal. Review of the MIP images confirms the above findings CTA HEAD FINDINGS Anterior circulation: Atherosclerotic calcifications are present within the cavernous internal carotid arteries bilaterally. There is tortuosity and moderate stenosis involving the cavernous right internal carotid artery. The left internal carotid artery is within normal limits through the ICA terminus. The A1 and M1 segments are normal. The anterior communicating artery is patent. Right A1 segment is dominant. MCA bifurcations are within normal limits. There is mild distal branch vessel narrowing involving ACA and MCA branch vessels. There is slight increased vascularity on the left, suggesting the possibility of luxury perfusion. Posterior circulation: The right vertebral artery is reconstituted at the V4 segment. PICA is opacified. A distal V4 stenosis is present. The left  vertebral artery is hypoplastic but patent through the vertebrobasilar junction. The basilar artery is small. Posterior communicating arteries are patent bilaterally. The PCA branch vessels are unremarkable. Small P1 segments are noted bilaterally. Venous sinuses:  Dural sinuses are patent. The straight sinus and deep cerebral veins are intact. Cortical veins are unremarkable. Anatomic variants: Prominent posterior communicating arteries bilaterally. Review of the MIP images confirms the above findings CT Brain Perfusion Findings: ASPECTS: 10/10 CBF (<30%) Volume: 80mL Perfusion (Tmax>6.0s) volume: 7mL Mismatch Volume: 10mL Infarction Location:Anterior left frontal lobe. IMPRESSION: 1. Area of ischemia in the anterior left frontal lobe. There is some bilateral increased T-max which is likely artifactual. 2. No significant core infarct. 3. Question luxury perfusion over the left hemisphere consistent with recent ischemia. 4. 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 5. The left vertebral artery is patent. There is flow in the distal right V4 segment to the posterior communicating artery. 6. 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 7. Prominent posterior communicating arteries bilaterally. 8. No other significant proximal large vessel occlusion. 9. No significant carotid stenosis in the neck. There is mild tortuosity. 10. Minimal atherosclerotic changes at the aortic arch without significant stenosis. These results were called by telephone at the time of interpretation on 09/14/2018 at 1:49 pm to Dr. Samara Snide , who verbally acknowledged these results. Electronically Signed   By: San Morelle M.D.   On: 09/14/2018 13:50    Scheduled Meds:  aspirin EC  81 mg Oral Daily   atorvastatin  40 mg Oral q1800   clopidogrel  75 mg Oral Q breakfast   heparin  5,000 Units Subcutaneous Q8H   insulin aspart  0-9 Units Subcutaneous Q4H   Continuous  Infusions:  sodium chloride 50 mL/hr at 09/14/18 1743     LOS: 0 days   Time spent: 35 minutes.   Darliss Cheney, MD [facility] Pager [phone]  If 7PM-7AM, please contact night-coverage [email or web address] Password [email or web address] 09/15/2018, 3:07 PM

## 2018-09-15 NOTE — Progress Notes (Signed)
Pt had a grand mal siezure for less than 1 minute. It was witnessed by NT who was passing by his room. Pt is currently alert. Dr. Cletus Gash was notified, neurologist was paged.

## 2018-09-15 NOTE — Evaluation (Signed)
Occupational Therapy Evaluation Patient Details Name: [name] MRN: [id number] DOB: [birth year] Today's Date: 09/15/2018    History of Present Illness 58 y.o. male with past medical history significant for hypertension, diabetes mellitus, hyperlipidemia, acute stroke in January 2020 presents to the emergency department with altered mental status and right-sided weakness and leaning to the right side.   Clinical Impression   Patient admitted for above, limited by problem list below including R sided weakness/impaired coordination, impaired vision, cognition, balance and activity tolerance. Pt unable to reports PLOF, home setup retrieved from chart review.  Patient currently requires max-total assist for ADLs, +2 mod assist for transfers. Pt with limited verbalizations throughout session, but he is oriented to self when given options, he is able to intermittently follow 1 step commands, has poor attention/awareness to R side.  Believe he will best benefit from intensive CIR level rehab in order to maximize independence with ADLs/mobility.  Will follow acutely.     Follow Up Recommendations  CIR;Supervision/Assistance - 24 hour    Equipment Recommendations  3 in 1 bedside commode    Recommendations for Other Services Rehab consult     Precautions / Restrictions Precautions Precautions: Fall Restrictions Weight Bearing Restrictions: No      Mobility Bed Mobility Overal bed mobility: Needs Assistance Bed Mobility: Rolling;Supine to Sit;Sit to Supine Rolling: Min assist   Supine to sit: Mod assist Sit to supine: Min guard(performed on his on)   General bed mobility comments: patient required moderate assist to come to upright, poor use of UE to engage in process, increased time and effort.  Patient did attempt (successfully) to lay down mid session but then required continued assist to come back to EOB before transition.  Transfers Overall transfer level: Needs  assistance Equipment used: 2 person hand held assist Transfers: Sit to/from [facility] Sit to Stand: +2 physical assistance;Mod assist Stand pivot transfers: +2 physical assistance;Min assist       General transfer comment: Moderate assist for power up from intiation to upright. Increased time and effort. Min assist for pivotal steps to chair but poor ability to transition back to sitting,    Balance Overall balance assessment: Needs assistance Sitting-balance support: Feet supported Sitting balance-Leahy Scale: Poor Sitting balance - Comments: poor midline posture, min guard to hands on assist to keep upright. Ant/post sway in sitting   Standing balance support: During functional activity;Bilateral upper extremity supported Standing balance-Leahy Scale: Poor Standing balance comment: relaint on BUE and external support                           ADL either performed or assessed with clinical judgement   ADL Overall ADL's : Needs assistance/impaired     Grooming: Maximal assistance;Sitting;Wash/dry face Grooming Details (indicate cue type and reason): hand over hand support to bring hand to face, unable to sustain acitvity without support Upper Body Bathing: Maximal assistance;Sitting   Lower Body Bathing: Total assistance;+2 for physical assistance;Sit to/from stand   Upper Body Dressing : Total assistance;Sitting   Lower Body Dressing: Total assistance;+2 for physical assistance;Sit to/from stand   Toilet Transfer: Moderate assistance;+2 for physical assistance;Stand-pivot Toilet Transfer Details (indicate cue type and reason): simulated to recliner  Toileting- Clothing Manipulation and Hygiene: Total assistance;Sit to/from stand       Functional mobility during ADLs: Moderate assistance;+2 for safety/equipment;+2 for physical assistance;Cueing for safety;Cueing for sequencing General ADL Comments: pt limited by R sided inattention, coordination  and cognition  Vision   Vision Assessment?: Vision impaired- to be further tested in functional context Additional Comments: limited assessment, able to scan grossly in all places; slight decreased attention/scanning to R side     Perception Perception Perception Tested?: Yes Perception Deficits: Inattention/neglect Inattention/Neglect: Does not attend to right visual field;Does not attend to right side of body   Praxis      Pertinent Vitals/Pain Pain Assessment: Faces Faces Pain Scale: No hurt     Hand Dominance Right   Extremity/Trunk Assessment Upper Extremity Assessment Upper Extremity Assessment: RUE deficits/detail;Difficult to assess due to impaired cognition RUE Deficits / Details: grossly weaker with poor coordination, R inattention noted; functionally able to open to attempt washing face but assist for functional movement  RUE Sensation: decreased proprioception;decreased light touch RUE Coordination: decreased fine motor;decreased gross motor   Lower Extremity Assessment Lower Extremity Assessment: Defer to PT evaluation RLE Deficits / Details: history of R toe ambutation RLE Coordination: decreased fine motor;decreased gross motor LLE Coordination: decreased fine motor;decreased gross motor       Communication Communication Communication: Expressive difficulties   Cognition Arousal/Alertness: Awake/alert;Lethargic(intermittent lethargy but arousable throughout) Behavior During Therapy: Flat affect Overall Cognitive Status: Difficult to assess Area of Impairment: Attention;Memory;Safety/judgement;Awareness;Problem solving;Following commands;Orientation                 Orientation Level: (pt oriented to person with cues) Current Attention Level: Focused;Sustained(perseverative ) Memory: Decreased short-term memory;Decreased recall of precautions Following Commands: Follows one step commands inconsistently;Follows one step commands with increased  time Safety/Judgement: Decreased awareness of safety;Decreased awareness of deficits Awareness: Intellectual Problem Solving: Slow processing;Decreased initiation;Difficulty sequencing;Requires verbal cues;Requires tactile cues(perseverative throughout session, limited R awareness)     General Comments       Exercises     Shoulder Instructions      Home Living Family/patient expects to be discharged to:: Private residence Living Arrangements: Other relatives Available Help at Discharge: Family;Available 24 hours/day Type of Home: Apartment Home Access: Stairs to enter [employer] of Steps: flight   Home Layout: Bed/bath upstairs     Bathroom Shower/Tub: Corporate investment banker: Standard Bathroom Accessibility: Yes   Home Equipment: None   Additional Comments: history obtained from chart review- pt unable to verbalize      Prior Functioning/Environment          Comments: assume independent, pt unable to verbalize        OT Problem List: Decreased strength;Decreased activity tolerance;Impaired balance (sitting and/or standing);Impaired vision/perception;Decreased coordination;Decreased cognition;Decreased safety awareness;Decreased knowledge of use of DME or AE;Decreased knowledge of precautions;Impaired UE functional use      OT Treatment/Interventions: Self-care/ADL training;Neuromuscular education;DME and/or AE instruction;Therapeutic activities;Balance training;Patient/family education;Cognitive remediation/compensation;Visual/perceptual remediation/compensation    OT Goals(Current goals can be found in the care plan section) Acute Rehab OT Goals Patient Stated Goal: none stated OT Goal Formulation: Patient unable to participate in goal setting Time For Goal Achievement: 09/29/18 Potential to Achieve Goals: Fair  OT Frequency: Min 2X/week   Barriers to D/C:            Co-evaluation PT/OT/SLP Co-Evaluation/Treatment:  Yes Reason for Co-Treatment: Complexity of the patient's impairments (multi-system involvement) PT goals addressed during session: Mobility/safety with mobility OT goals addressed during session: ADL's and self-care      AM-PAC OT "6 Clicks" Daily Activity     Outcome Measure Help from another person eating meals?: Total Help from another person taking care of personal grooming?: A Lot Help from another person toileting, which includes  using toliet, bedpan, or urinal?: Total Help from another person bathing (including washing, rinsing, drying)?: A Lot Help from another person to put on and taking off regular upper body clothing?: Total Help from another person to put on and taking off regular lower body clothing?: Total 6 Click Score: 8   End of Session Nurse Communication: Mobility status;Precautions  Activity Tolerance: Patient tolerated treatment well Patient left: in chair;with call bell/phone within reach;with chair alarm set  OT Visit Diagnosis: Other abnormalities of gait and mobility (R26.89);Other symptoms and signs involving the nervous system (R29.898)                Time: 0840-0900 OT Time Calculation (min): 20 min Charges:  OT General Charges $OT Visit: 1 Visit OT Evaluation $OT Eval Moderate Complexity: [place], OT Acute Rehabilitation Services Pager [phone] Office [phone]   Delight Stare 09/15/2018, 11:09 AM

## 2018-09-15 NOTE — Progress Notes (Signed)
STROKE TEAM PROGRESS NOTE   INTERVAL HISTORY His nephew is at the bedside.  Pt still aphasia, intermittently following commands, not blinking to visual threat on the right. Left arm drift. However, MRI negative for new stroke. Concerning for post ictal state, planned for EEG in am.   However, pt had GTC x 2 at 3:45pm, lasting 1 min each with 10 min apart. Witnessed by nurse. Gave ativan 2mg  and loaded with keppar. Updated pt sister over the phone. Pt admission presentation most likely due to seizure with postictal state. Will continue keppra IV and will do LTM EEG overnight.    OBJECTIVE Vitals:   09/15/18 0018 09/15/18 0402 09/15/18 0731 09/15/18 1206  BP: (!) 160/83 (!) 166/82 (!) 156/82 (!) 157/87  Pulse: 69 71 64 64  Resp: 18 18 16 20   Temp: 98.8 F (37.1 C) 100 F (37.8 C) 99.6 F (37.6 C) 99.5 F (37.5 C)  TempSrc: Oral Oral Oral Oral  SpO2: 100% 99% 98% 100%    CBC:  Recent Labs  Lab 09/14/18 1052 09/14/18 1100 09/15/18 0639  WBC 6.7  --  6.0  NEUTROABS 5.3  --   --   HGB 13.4 14.3 12.7*  HCT 40.4 42.0 38.1*  MCV 89.2  --  88.4  PLT 157  --  145*    Basic Metabolic Panel:  Recent Labs  Lab 09/14/18 1052 09/14/18 1100 09/15/18 0639  NA 132* 134* 132*  K 3.4* 3.3* 3.2*  CL 98 97* 98  CO2 20*  --  24  GLUCOSE 183* 185* 103*  BUN 8 10 8   CREATININE 0.80 0.70 0.87  CALCIUM 8.9  --  8.6*    Lipid Panel:     Component Value Date/Time   CHOL 93 09/15/2018 0639   TRIG 65 09/15/2018 0639   HDL 39 (L) 09/15/2018 0639   CHOLHDL 2.4 09/15/2018 0639   VLDL 13 09/15/2018 0639   LDLCALC 41 09/15/2018 0639   HgbA1c:  Lab Results  Component Value Date   HGBA1C 7.3 (H) 09/14/2018   Urine Drug Screen:     Component Value Date/Time   LABOPIA NONE DETECTED 09/14/2018 1715   COCAINSCRNUR NONE DETECTED 09/14/2018 1715   LABBENZ NONE DETECTED 09/14/2018 1715   AMPHETMU NONE DETECTED 09/14/2018 1715   THCU NONE DETECTED 09/14/2018 1715   LABBARB NONE DETECTED  09/14/2018 1715    Alcohol Level     Component Value Date/Time   ETH <10 09/14/2018 1052    IMAGING  Ct Angio Head W Or Wo Contrast Ct Angio Neck W And/or Wo Contrast Ct Cerebral Perfusion W Contrast 09/14/2018  IMPRESSION:  1. Area of ischemia in the anterior left frontal lobe. There is some bilateral increased T-max which is likely artifactual.  2. No significant core infarct.  3. Question luxury perfusion over the left hemisphere consistent with recent ischemia.  4. 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  5. The left vertebral artery is patent. There is flow in the distal right V4 segment to the posterior communicating artery.  6. 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  7. Prominent posterior communicating arteries bilaterally.  8. No other significant proximal large vessel occlusion.  9. No significant carotid stenosis in the neck. There is mild tortuosity.  10. Minimal atherosclerotic changes at the aortic arch without significant stenosis.   Ct Head Wo Contrast 09/14/2018 IMPRESSION:  No acute intracranial process. Atrophy and chronic microvascular ischemic changes.    Mr  Brain Wo Contrast 09/15/2018 IMPRESSION:  1. No acute intracranial infarct or other abnormality identified.  2. Multiple scattered chronic lacunar infarcts involving the right caudate, bilateral thalami, pons, and bilateral cerebellar hemispheres.  3. Moderately advanced cerebral atrophy with mild chronic microvascular ischemic disease.   Transthoracic Echocardiogram   1. The left ventricle has normal systolic function, with an ejection fraction of 55-60%. The cavity size was normal. Left ventricular diastolic Doppler parameters are indeterminate. No evidence of left ventricular regional wall motion abnormalities.  2. The right ventricle has normal systolic function. The cavity was normal. There is no increase in right ventricular wall thickness.  Right ventricular systolic pressure could not be assessed.  3. Left atrial size was mildly dilated.  4. The aortic valve is tricuspid. Mild calcification of the aortic valve. The noncoronary cusp is mildly thickened and restricted in motion. Aortic valve regurgitation is mild by color flow Doppler. Mild aortic annular calcification noted.  5. The mitral valve is grossly normal. There is mild mitral annular calcification present.  6. The tricuspid valve is grossly normal.  7. The aorta is normal unless otherwise noted.  ECG - SR rate 71 BPM. (See cardiology reading for complete details)  EEG - pending   PHYSICAL EXAM  Temp:  [98.8 F (37.1 C)-100.1 F (37.8 C)] 100 F (37.8 C) (08/23 1644) Pulse Rate:  [64-80] 80 (08/23 1644) Resp:  [14-20] 20 (08/23 1644) BP: (104-195)/(67-90) 104/78 (08/23 1644) SpO2:  [98 %-100 %] 100 % (08/23 1644) Weight:  [80 kg] 80 kg (08/23 1600)  General - Well nourished, well developed, in no apparent distress.  Ophthalmologic - fundi not visualized due to noncooperation.  Cardiovascular - Regular rate and rhythm.  Neuro - pt lethargic, but eyes open on voice. However, expressive aphasia, mumbles some sounds but not words, however, intermittently he was able to tell me his name. Not answer questions appropriately, but able to follow most simple commands, but not all. Blinking to visual threat consistent on the left but not consistent to the right. Bilateral gaze intact. PERRL. No facial asymmetry. LUE at least 4/5 and RUE drift 3+/5, BLE 4/5. Coordination not cooperative. Sensation not cooperative, gait not tested.   ASSESSMENT/PLAN [name] is a 58 y.o. male with history of hypertension, diabetes mellitus, tobacco use, aspvd, hyperlipidemia, acute stroke in January 2020 presenting with confusion, right sided weakness, aphasia and recent fall. He did not receive IV t-PA due to late presentation (>4.5 hours from time of onset).  Seizure with post  ictal state - likely related to hx of stroke  Resultant GTC x 2   CT head - No acute intracranial process.   MRI head - No acute intracranial infarct or other abnormality identified. Multiple scattered chronic lacunar infarcts   CTA H&N and CTP - area of ischemia in the anterior left frontal lobe. 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   2D Echo EF 55-60%  EEG - pending  [employer] Virus 2 novel coronavirus - negative  LDL - 41  HgbA1c - 7.3  UDS - negative  VTE prophylaxis - [place] Heparin  aspirin 81 mg daily and clopidogrel 75 mg daily prior to admission, now on aspirin 81 mg daily and clopidogrel 75 mg daily.  Ativan 2mg  once + keppra 1.5 g load followed by keppra 500mg  bid   Patient counseled to be compliant with his antithrombotic medications  Ongoing aggressive stroke risk factor management  Therapy recommendations:  CIR  Disposition:  Pending  Hx of stroke  01/2018 admitted for dizziness and gait imbalance.  MRI showed left pontine infarct and old left thalamic infarct.  MRA showed right VA occlusion.  Carotid Doppler negative.  EF 55 to 60%.  LDL 84 and A1c 7.3.  He was put on DAPT and Lipitor 40.  Fever   Tmax 100  Pt also has cough after seizure  Concerning for aspiration  CXR pending  May consider empiric Abx if fever persists  Hypertension  Home BP meds: Norvasc 5 mg daily  Current BP meds: none  Stable . Long-term BP goal normotensive  Hyperlipidemia  Home Lipid lowering medication: Lipitor 40 mg daily  LDL 41, goal < 70  Current lipid lowering medication: Lipitor 40 mg daily  Continue statin at discharge  Diabetes  Home diabetic meds: Metformin 1,000 mg Bid  HgbA1c 7.3, goal < 7.0  SSI  CBG monitoring  PCP close follow up  Tobacco abuse  Current smoker  Smoking cessation counseling provided  Pt is willing to quit  Other Stroke Risk  Factors    Other Active Problems  Hypokalemia - 3.4->3.3->3.2 supplemented  Hyponatremia - 132  Mild thrombocytopenia   Hospital day # 0  I spent  35 minutes in total face-to-face time with the patient, more than 50% of which was spent in counseling and coordination of care, reviewing test results, images and medication, and discussing the diagnosis of seizure, post ictal, hx of stroke, fever, DM, HTN, treatment plan and potential prognosis. This patient's care requiresreview of multiple databases, neurological assessment, discussion with family, other specialists and medical decision making of high complexity. I had long discussion with sister over the phone, updated pt current condition, treatment plan and potential prognosis. She expressed understanding and appreciation.   Rosalin Hawking, MD PhD Stroke Neurology 09/15/2018 7:20 PM    To contact Stroke Continuity provider, please refer to [email or web address] After hours, contact General Neurology

## 2018-09-15 NOTE — Evaluation (Signed)
Physical Therapy Evaluation Patient Details Name: [name] MRN: [id number] DOB: 1960-08-14 Today's Date: 09/15/2018   History of Present Illness  58 y.o. male with past medical history significant for hypertension, diabetes mellitus, hyperlipidemia, acute stroke in January 2020 presents to the emergency department with altered mental status and right-sided weakness and leaning to the right side.  Clinical Impression  Orders received for PT evaluation. Patient demonstrates deficits in functional mobility as indicated below. Will benefit from continued skilled PT to address deficits and maximize function. Will see as indicated and progress as tolerated.  I have attempted to discuss the patient's current level of function related to neuro deficits with the patient but patient is unable to engage in communication at this time. Given current deficits, feel patient will benefit from comprehensive inpatient therapies. Recommend CIR at this time.         Follow Up Recommendations CIR    Equipment Recommendations  (TBD)    Recommendations for Other Services Rehab consult     Precautions / Restrictions Precautions Precautions: Fall Restrictions Weight Bearing Restrictions: No      Mobility  Bed Mobility Overal bed mobility: Needs Assistance Bed Mobility: Rolling;Supine to Sit;Sit to Supine Rolling: Min assist   Supine to sit: Mod assist Sit to supine: Min guard(performed on his on)   General bed mobility comments: patient required moderate assist to come to upright, poor use of UE to engage in process, increased time and effort.  Patient did attempt (successfully) to lay down mid session but then required continued assist to come back to EOB before transition.  Transfers Overall transfer level: Needs assistance Equipment used: 2 person hand held assist Transfers: Sit to/from [facility] Sit to Stand: +2 physical assistance;Mod assist Stand pivot transfers: +2  physical assistance;Min assist       General transfer comment: Moderate assist for power up from intiation to upright. Increased time and effort. Min assist for pivotal steps to chair but poor ability   Ambulation/Gait             General Gait Details: limited to transfer steps only  Stairs            Wheelchair Mobility    Modified Rankin (Stroke Patients Only)       Balance Overall balance assessment: Needs assistance Sitting-balance support: Feet supported Sitting balance-Leahy Scale: Poor Sitting balance - Comments: poor midline posture, min guard to hands on assist to keep upright. Ant/post sway in sitting   Standing balance support: During functional activity Standing balance-Leahy Scale: Poor Standing balance comment: requires hands on assist in upright                             Pertinent Vitals/Pain Pain Assessment: Faces Faces Pain Scale: No hurt    Home Living Family/patient expects to be discharged to:: Private residence Living Arrangements: Other relatives Available Help at Discharge: Family;Available 24 hours/day Type of Home: Apartment Home Access: Stairs to enter   Entrance Stairs-Number of Steps: flight Home Layout: Bed/bath upstairs Home Equipment: None Additional Comments: history obtained from [employer] [facility]    Prior Function                 Hand Dominance        Extremity/Trunk Assessment   Upper Extremity Assessment Upper Extremity Assessment: RUE deficits/detail;Difficult to assess due to impaired cognition RUE Coordination: decreased fine motor;decreased gross motor    Lower Extremity Assessment Lower  Extremity Assessment: Generalized weakness;LLE deficits/detail;RLE deficits/detail RLE Deficits / Details: history of R toe ambutation RLE Coordination: decreased fine motor;decreased gross motor LLE Coordination: decreased fine motor;decreased gross motor       Communication   Communication:  Expressive difficulties  Cognition Arousal/Alertness: Awake/alert;Lethargic(intermittent lethargy but arousable throughout) Behavior During Therapy: Flat affect Overall Cognitive Status: Difficult to assess Area of Impairment: Attention;Memory;Safety/judgement;Awareness;Problem solving;Following commands                   Current Attention Level: Focused;Sustained(perseverative )   Following Commands: Follows one step commands inconsistently;Follows one step commands with increased time Safety/Judgement: Decreased awareness of safety;Decreased awareness of deficits Awareness: Intellectual Problem Solving: Slow processing;Decreased initiation;Difficulty sequencing;Requires verbal cues;Requires tactile cues(perseverative throughout session, limited R awareness)        General Comments      Exercises     Assessment/Plan    PT Assessment Patient needs continued PT services  PT Problem List Decreased strength;Decreased activity tolerance;Decreased balance;Decreased mobility;Decreased coordination;Decreased cognition;Decreased safety awareness       PT Treatment Interventions DME instruction;Gait training;Stair training;Functional mobility training;Therapeutic activities;Therapeutic exercise;Balance training;Neuromuscular re-education;Cognitive remediation;Patient/family education    PT Goals (Current goals can be found in the Care Plan section)  Acute Rehab PT Goals Patient Stated Goal: none stated Time For Goal Achievement: 09/22/18 Potential to Achieve Goals: Good    Frequency Min 4X/week   Barriers to discharge        Co-evaluation PT/OT/SLP Co-Evaluation/Treatment: Yes Reason for Co-Treatment: Complexity of the patient's impairments (multi-system involvement) PT goals addressed during session: Mobility/safety with mobility OT goals addressed during session: ADL's and self-care       AM-PAC PT "6 Clicks" Mobility  Outcome Measure Help needed turning from your  back to your side while in a flat bed without using bedrails?: A Lot Help needed moving from lying on your back to sitting on the side of a flat bed without using bedrails?: A Lot Help needed moving to and from a bed to a chair (including a wheelchair)?: A Lot Help needed standing up from a chair using your arms (e.g., wheelchair or bedside chair)?: A Lot Help needed to walk in hospital room?: A Lot Help needed climbing 3-5 steps with a railing? : Total 6 Click Score: 11    End of Session   Activity Tolerance: Patient limited by fatigue Patient left: in chair;with call bell/phone within reach;with chair alarm set Nurse Communication: Mobility status PT Visit Diagnosis: Unsteadiness on feet (R26.81);Muscle weakness (generalized) (M62.81);Apraxia (R48.2);Other symptoms and signs involving the nervous system (R29.898)    Time: [id number] PT Time Calculation (min) (ACUTE ONLY): 21 min   Charges:   PT Evaluation $PT Eval Moderate Complexity: 1 Mod          Alben Deeds, PT DPT  Board Certified Neurologic Specialist Acute Rehabilitation Services Pager [phone] Office [place] 09/15/2018, 9:15 AM

## 2018-09-15 NOTE — Progress Notes (Signed)
  Echocardiogram 2D Echocardiogram has been performed.  [name] 09/15/2018, 2:44 PM

## 2018-09-15 NOTE — Progress Notes (Signed)
Rehab Admissions Coordinator Note:  Per PT recommendation, this patient was screened by Jhonnie Garner for appropriateness for an Inpatient Acute Rehab Consult.  Noted MRI shows no acute infarct and pt is currently under observation status. At this time, feel pt would not meet medical necessity criteria to warrant an IP Rehab stay. Will follow at a distance and monitor for a diagnosis amenable to CIR.   Jhonnie Garner 09/15/2018, 11:06 AM  I can be reached at [phone].

## 2018-09-16 ENCOUNTER — Observation Stay (HOSPITAL_COMMUNITY): Payer: Self-pay

## 2018-09-16 DIAGNOSIS — G40909 Epilepsy, unspecified, not intractable, without status epilepticus: Secondary | ICD-10-CM

## 2018-09-16 DIAGNOSIS — R531 Weakness: Secondary | ICD-10-CM

## 2018-09-16 LAB — CBC WITH DIFFERENTIAL/PLATELET
Abs Immature Granulocytes: 0.01 10*3/uL (ref 0.00–0.07)
Basophils Absolute: 0 10*3/uL (ref 0.0–0.1)
Basophils Relative: 0 %
Eosinophils Absolute: 0.1 10*3/uL (ref 0.0–0.5)
Eosinophils Relative: 1 %
HCT: 38.2 % — ABNORMAL LOW (ref 39.0–52.0)
Hemoglobin: 12.9 g/dL — ABNORMAL LOW (ref 13.0–17.0)
Immature Granulocytes: 0 %
Lymphocytes Relative: 30 %
Lymphs Abs: 1.7 10*3/uL (ref 0.7–4.0)
MCH: 29.9 pg (ref 26.0–34.0)
MCHC: 33.8 g/dL (ref 30.0–36.0)
MCV: 88.4 fL (ref 80.0–100.0)
Monocytes Absolute: 0.4 10*3/uL (ref 0.1–1.0)
Monocytes Relative: 7 %
Neutro Abs: 3.4 10*3/uL (ref 1.7–7.7)
Neutrophils Relative %: 62 %
Platelets: 147 10*3/uL — ABNORMAL LOW (ref 150–400)
RBC: 4.32 MIL/uL (ref 4.22–5.81)
RDW: 12.7 % (ref 11.5–15.5)
WBC: 5.6 10*3/uL (ref 4.0–10.5)
nRBC: 0 % (ref 0.0–0.2)

## 2018-09-16 LAB — GLUCOSE, CAPILLARY
Glucose-Capillary: 110 mg/dL — ABNORMAL HIGH (ref 70–99)
Glucose-Capillary: 110 mg/dL — ABNORMAL HIGH (ref 70–99)
Glucose-Capillary: 121 mg/dL — ABNORMAL HIGH (ref 70–99)
Glucose-Capillary: 145 mg/dL — ABNORMAL HIGH (ref 70–99)
Glucose-Capillary: 145 mg/dL — ABNORMAL HIGH (ref 70–99)
Glucose-Capillary: 147 mg/dL — ABNORMAL HIGH (ref 70–99)
Glucose-Capillary: 187 mg/dL — ABNORMAL HIGH (ref 70–99)
Glucose-Capillary: 81 mg/dL (ref 70–99)

## 2018-09-16 LAB — BASIC METABOLIC PANEL
Anion gap: 12 (ref 5–15)
BUN: 11 mg/dL (ref 6–20)
CO2: 20 mmol/L — ABNORMAL LOW (ref 22–32)
Calcium: 8.5 mg/dL — ABNORMAL LOW (ref 8.9–10.3)
Chloride: 102 mmol/L (ref 98–111)
Creatinine, Ser: 0.8 mg/dL (ref 0.61–1.24)
GFR calc Af Amer: >60 mL/min (ref >60)
GFR calc non Af Amer: >60 mL/min (ref >60)
Glucose, Bld: 86 mg/dL (ref 70–99)
Potassium: 3.2 mmol/L — ABNORMAL LOW (ref 3.5–5.1)
Sodium: 134 mmol/L — ABNORMAL LOW (ref 135–145)

## 2018-09-16 LAB — MAGNESIUM: Magnesium: 1.8 mg/dL (ref 1.7–2.4)

## 2018-09-16 MED ORDER — POTASSIUM CHLORIDE CRYS ER 20 MEQ PO TBCR
40.0000 meq | EXTENDED_RELEASE_TABLET | ORAL | Status: AC
  Administered 2018-09-16 (×2): 40 meq via ORAL
  Filled 2018-09-16 (×2): qty 2

## 2018-09-16 MED ORDER — AMLODIPINE BESYLATE 2.5 MG PO TABS
2.5000 mg | ORAL_TABLET | Freq: Every day | ORAL | Status: DC
  Administered 2018-09-16 – 2018-09-19 (×4): 2.5 mg via ORAL
  Filled 2018-09-16 (×4): qty 1

## 2018-09-16 MED ORDER — TAMSULOSIN HCL 0.4 MG PO CAPS
0.4000 mg | ORAL_CAPSULE | Freq: Every day | ORAL | Status: DC
  Administered 2018-09-16 – 2018-09-18 (×4): 0.4 mg via ORAL
  Filled 2018-09-16 (×4): qty 1

## 2018-09-16 NOTE — Progress Notes (Addendum)
EEG Completed; Results Pending  

## 2018-09-16 NOTE — Progress Notes (Signed)
LTM started, nurse educated on event button.

## 2018-09-16 NOTE — Progress Notes (Signed)
STROKE TEAM PROGRESS NOTE   INTERVAL HISTORY Pt on LTM EEG today. No more clinical seizure. Pt today more awake alert than yesterday, able to tell me his name, able to repeat 3 word sentences, tracking bilaterally, follow central commands but not peripheral commands, still has mild right neglect and aphasia.     OBJECTIVE Vitals:   09/15/18 2311 09/16/18 0413 09/16/18 0748 09/16/18 1111  BP: (!) 170/84 (!) 175/91 (!) 158/91 137/79  Pulse: 72 71 69 68  Resp: 18 18 18 18   Temp: 98.8 F (37.1 C) 98.7 F (37.1 C) 98.9 F (37.2 C) 99.3 F (37.4 C)  TempSrc: Oral Oral Oral Oral  SpO2: 98% 100% 100% 99%  Weight:      Height:        CBC:  Recent Labs  Lab 09/14/18 1052  09/15/18 0639 09/16/18 0830  WBC 6.7  --  6.0 5.6  NEUTROABS 5.3  --   --  3.4  HGB 13.4   < > 12.7* 12.9*  HCT 40.4   < > 38.1* 38.2*  MCV 89.2  --  88.4 88.4  PLT 157  --  145* 147*   < > = values in this interval not displayed.    Basic Metabolic Panel:  Recent Labs  Lab 09/15/18 0639 09/16/18 0830  NA 132* 134*  K 3.2* 3.2*  CL 98 102  CO2 24 20*  GLUCOSE 103* 86  BUN 8 11  CREATININE 0.87 0.80  CALCIUM 8.6* 8.5*  MG  --  1.8    Lipid Panel:     Component Value Date/Time   CHOL 93 09/15/2018 0639   TRIG 65 09/15/2018 0639   HDL 39 (L) 09/15/2018 0639   CHOLHDL 2.4 09/15/2018 0639   VLDL 13 09/15/2018 0639   LDLCALC 41 09/15/2018 0639   HgbA1c:  Lab Results  Component Value Date   HGBA1C 7.3 (H) 09/14/2018   Urine Drug Screen:     Component Value Date/Time   LABOPIA NONE DETECTED 09/14/2018 1715   COCAINSCRNUR NONE DETECTED 09/14/2018 1715   LABBENZ NONE DETECTED 09/14/2018 1715   AMPHETMU NONE DETECTED 09/14/2018 1715   THCU NONE DETECTED 09/14/2018 1715   LABBARB NONE DETECTED 09/14/2018 1715    Alcohol Level     Component Value Date/Time   ETH <10 09/14/2018 1052    IMAGING Ct Head Wo Contrast 09/14/2018 IMPRESSION:  No acute intracranial process. Atrophy and  chronic microvascular ischemic changes.   Ct Angio Head W Or Wo Contrast Ct Angio Neck W And/or Wo Contrast Ct Cerebral Perfusion W Contrast 09/14/2018  IMPRESSION:  1. Area of ischemia in the anterior left frontal lobe. There is some bilateral increased T-max which is likely artifactual.  2. No significant core infarct.  3. Question luxury perfusion over the left hemisphere consistent with recent ischemia.  4. 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  5. The left vertebral artery is patent. There is flow in the distal right V4 segment to the posterior communicating artery.  6. 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  7. Prominent posterior communicating arteries bilaterally.  8. No other significant proximal large vessel occlusion.  9. No significant carotid stenosis in the neck. There is mild tortuosity.  10. Minimal atherosclerotic changes at the aortic arch without significant stenosis.    Mr Brain Wo Contrast 09/15/2018 IMPRESSION:  1. No acute intracranial infarct or other abnormality identified.  2. Multiple scattered chronic lacunar infarcts involving the  right caudate, bilateral thalami, pons, and bilateral cerebellar hemispheres.  3. Moderately advanced cerebral atrophy with mild chronic microvascular ischemic disease.   Transthoracic Echocardiogram   1. The left ventricle has normal systolic function, with an ejection fraction of 55-60%. The cavity size was normal. Left ventricular diastolic Doppler parameters are indeterminate. No evidence of left ventricular regional wall motion abnormalities.  2. The right ventricle has normal systolic function. The cavity was normal. There is no increase in right ventricular wall thickness. Right ventricular systolic pressure could not be assessed.  3. Left atrial size was mildly dilated.  4. The aortic valve is tricuspid. Mild calcification of the aortic valve. The noncoronary cusp is  mildly thickened and restricted in motion. Aortic valve regurgitation is mild by color flow Doppler. Mild aortic annular calcification noted.  5. The mitral valve is grossly normal. There is mild mitral annular calcification present.  6. The tricuspid valve is grossly normal.  7. The aorta is normal unless otherwise noted.  ECG - SR rate 71 BPM. (See cardiology reading for complete details)  EEG - This EEG recorded evidence of a non-specific left hemispheric dysfunction. There was no seizure or predisposition seen.  Please note that lack of epileptiform activity on EEG does not preclude the possibility of epilepsy.   LTM EEG pending   PHYSICAL EXAM   Temp:  [98.7 F (37.1 C)-100 F (37.8 C)] 99.3 F (37.4 C) (08/24 1111) Pulse Rate:  [68-80] 68 (08/24 1111) Resp:  [18-20] 18 (08/24 1111) BP: (104-195)/(78-91) 137/79 (08/24 1111) SpO2:  [98 %-100 %] 99 % (08/24 1111) Weight:  [80 kg] 80 kg (08/23 1600)  General - Well nourished, well developed, in no apparent distress.  Ophthalmologic - fundi not visualized due to noncooperation.  Cardiovascular - Regular rate and rhythm.  Neuro - pt awake alert, eyes open. Still has expressive aphasia only able to tell me his name, able to repeat 3 word sentences, but not long sentence, not able to name and no spontaneous speech, only follow central commands but not peripheral commands. Blinking to visual threat consistent on the left but not consistent to the right. Bilateral gaze intact, tracking b/l. PERRL. No facial asymmetry. Moving all extremities equally. Coordination and sensation not cooperative, gait not tested.   ASSESSMENT/PLAN Mr. Tunney Lamprecht is a 58 y.o. male with history of hypertension, diabetes mellitus, tobacco use, aspvd, hyperlipidemia, acute stroke in January 2020 presenting with confusion, right sided weakness, aphasia and recent fall. He did not receive IV t-PA due to late presentation (>4.5 hours from time of  onset).  Seizure with prolonged post ictal state - likely related to hx of stroke  Resultant GTC x 2 on 8/23   CT head - No acute intracranial process.   MRI head - No acute intracranial infarct or other abnormality identified. Multiple scattered chronic lacunar infarcts   CTA H&N and CTP - area of ischemia in the anterior left frontal lobe. 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   2D Echo EF 55-60%  Spot EEG - left slowing, no seizure  LTM EEG - pending   [employer] Virus 2 novel coronavirus - negative  LDL - 41  HgbA1c - 7.3  UDS - negative  VTE prophylaxis - [place] Heparin  aspirin 81 mg daily and clopidogrel 75 mg daily prior to admission, now on aspirin 81 mg daily and clopidogrel 75 mg daily.  Had keppra 1.5 g load, now on keppra 500mg   bid   Therapy recommendations:  CIR  Disposition:  Pending  Hx of stroke  01/2018 admitted for dizziness and gait imbalance.  MRI showed left pontine infarct and old left thalamic infarct.  MRA showed right VA occlusion.  Carotid Doppler negative.  EF 55 to 60%.  LDL 84 and A1c 7.3.  He was put on DAPT and Lipitor 40.  Fever, resolved  Tmax 100 -> afebrile  Pt had cough after seizure episode  Concerning for aspiration  CXR NAD   Hypertension  Home BP meds: Norvasc 5 mg daily  Current BP meds: none  Stable . Long-term BP goal normotensive  Hyperlipidemia  Home Lipid lowering medication: Lipitor 40 mg daily  LDL 41, goal < 70  Current lipid lowering medication: Lipitor 40 mg daily  Continue statin at discharge  Diabetes  Home diabetic meds: Metformin 1,000 mg Bid  HgbA1c 7.3, goal < 7.0  SSI  CBG monitoring  PCP close follow up  Tobacco abuse  Current smoker  Smoking cessation counseling provided  Pt is willing to quit  Other Stroke Risk Factors    Other Active Problems  Hypokalemia - 3.4->3.3->3.2  supplemented  Hyponatremia - 132  Mild thrombocytopenia   Hospital day # 0   Rosalin Hawking, MD PhD Stroke Neurology 09/16/2018 2:04 PM   To contact Stroke Continuity provider, please refer to [email or web address] After hours, contact General Neurology

## 2018-09-16 NOTE — Progress Notes (Signed)
Patient has not voided since 0400 when in and out cath was completed. Bladder scan preformed and 500 cc of urine in bladder. MD notified. MD stated to that he would place a order for patient to have foley catheter placed. Awaiting orders. Will continue to monitor.

## 2018-09-16 NOTE — Procedures (Signed)
History: 58 yo M with   Sedation: None  Technique: This is a 21 channel routine scalp EEG performed at the bedside with bipolar and monopolar montages arranged in accordance to the international 10/20 system of electrode placement. One channel was dedicated to EKG recording.    Background: There is a posterior dominant rhythm of 8 Hz which is asymmetric with attenuation on the left. There is also mild irregular 1 -2 Hz slow activity diffusely in the left hemisphere as well. There are two runs of bilaterally symmetric, centrally predominant sharply contoured activity occurring in early sleep favored to represent repetitive vertex waves as opposed to epileptiform activity.No epileptiform discharges were seen.   Photic stimulation: Physiologic driving is not performed  EEG Abnormalities: 1) Left hemispheric slow activity 2) Attenuation of faster frequencies on the left.   Clinical Interpretation: This EEG recorded evidence of a non-specific left hemispheric dysfunction. There was no seizure or predisposition seen.  Please note that lack of epileptiform activity on EEG does not preclude the possibility of epilepsy.   Roland Rack, MD [facility] [phone]  If 7pm- 7am, please page neurology on call as listed in [place].

## 2018-09-16 NOTE — Progress Notes (Signed)
Physical Therapy Treatment Patient Details Name: [name] MRN: [id number] DOB: September 27, 1960 Today's Date: 09/16/2018    History of Present Illness 58 y.o. male with past medical history significant for hypertension, diabetes mellitus, hyperlipidemia, acute stroke in January 2020 presents to the emergency department with altered mental status and right-sided weakness and leaning to the right side.  MRI negative for stroke.  EEG pending for possible post-ictal state weakness.     PT Comments    Session limited by pt being connected to LT EEG.  He was able to sit EOB and stand with RW EOB taking 2-3 side steps to the [facility] to reposition in supine.  Mod assist overall for OOB mobility.  CIR states he is not a candidate for their program, so he may need post acute rehab at SNF prior to going home.  PT will continue to follow acutely for safe mobility progression   Follow Up Recommendations  SNF     Equipment Recommendations  Rolling walker with 5" wheels;3in1 (PT);Hospital bed    Recommendations for Other Services   NA     Precautions / Restrictions Precautions Precautions: Fall    Mobility  Bed Mobility Overal bed mobility: Needs Assistance Bed Mobility: Supine to Sit;Sit to Supine     Supine to sit: Min assist;HOB elevated Sit to supine: Min assist;HOB elevated   General bed mobility comments: Min assist to support trunk to come to sitting EOB.  Multimodal cues used to get pt to intiate moving legs to EOB.    Transfers Overall transfer level: Needs assistance Equipment used: 1 person hand held assist;Rolling walker (2 wheeled) Transfers: Sit to/from Stand Sit to Stand: Mod assist         General transfer comment: Mod assist to stand from bed without RW (as pt reports he did not use one PTA), mod assist to stand from bed with RW, however, better ability to stabilize once up.    Ambulation/Gait Ambulation/Gait assistance: Min assist Gait Distance (Feet): 3  Feet Assistive device: Rolling walker (2 wheeled) Gait Pattern/deviations: Step-to pattern(side step)     General Gait Details: side step to [facility], limited by LT EEG wire.           Balance Overall balance assessment: Needs assistance Sitting-balance support: Feet supported;Bilateral upper extremity supported Sitting balance-Leahy Scale: Poor Sitting balance - Comments: min assist to close supervision.   Postural control: Posterior lean Standing balance support: Bilateral upper extremity supported Standing balance-Leahy Scale: Poor Standing balance comment: needs external support in standing from PT And support from RW.  Posterior lean in standing.                             Cognition Arousal/Alertness: Awake/alert Behavior During Therapy: Flat affect Overall Cognitive Status: Impaired/Different from baseline Area of Impairment: Following commands;Safety/judgement;Awareness;Problem solving                   Current Attention Level: Sustained   Following Commands: Follows one step commands inconsistently;Follows one step commands with increased time Safety/Judgement: Decreased awareness of safety;Decreased awareness of deficits Awareness: Intellectual Problem Solving: Slow processing;Decreased initiation;Difficulty sequencing;Requires verbal cues;Requires tactile cues General Comments: Pt needed multimodal cues to come to sitting EOB.  Very slow to process vs also HOH?  Very slow to move as well.       Exercises General Exercises - Lower Extremity Long Arc Quad: AROM;Both;10 reps Hip Flexion/Marching: (attempted, but pt unable to do or  unable to understand)        Pertinent Vitals/Pain Pain Assessment: No/denies pain       Prior Function            PT Goals (current goals can now be found in the care plan section) Acute Rehab PT Goals Patient Stated Goal: Pt wants to know when he can go home.  Progress towards PT goals: Progressing toward  goals    Frequency    Min 3X/week      PT Plan Discharge plan needs to be updated       AM-PAC PT "6 Clicks" Mobility   Outcome Measure  Help needed turning from your back to your side while in a flat bed without using bedrails?: A Lot Help needed moving from lying on your back to sitting on the side of a flat bed without using bedrails?: A Little Help needed moving to and from a bed to a chair (including a wheelchair)?: A Lot Help needed standing up from a chair using your arms (e.g., wheelchair or bedside chair)?: A Lot Help needed to walk in hospital room?: A Lot Help needed climbing 3-5 steps with a railing? : Total 6 Click Score: 12    End of Session   Activity Tolerance: Patient tolerated treatment well Patient left: in bed;with call bell/phone within reach;with bed alarm set   PT Visit Diagnosis: Unsteadiness on feet (R26.81);Muscle weakness (generalized) (M62.81);Apraxia (R48.2);Other symptoms and signs involving the nervous system [id number])     Time: [id number] PT Time Calculation (min) (ACUTE ONLY): 17 min  Charges:  $Therapeutic Activity: 8-22 mins          Tracia Lacomb B. Rodneshia Greenhouse, PT, DPT  Acute Rehabilitation [phone] pager [phone] office  @ [place]: [phone]            09/16/2018, 6:03 PM

## 2018-09-16 NOTE — Progress Notes (Signed)
PROGRESS NOTE    [name]  [id number] DOB: March 14, 1960 DOA: 09/14/2018 PCP: Glendon Axe, MD   Brief Narrative:  [name]  is a 58 y.o. male, with past medical history significant for hypertension, hyperlipidemia, diabetes mellitus, recent admission and January 2020 for acute CVA. apparently patient was noted by his brother to be leaning to the left, patient had unsteady gait yesterday, he was last known normal was yesterday morning, brother found him in the bathroom this morning, he had fallen, patient denied any head trauma, he denies any pain or injuries, himself a poor historian.  - in ED patient was noted to have mild right-sided weakness, seen by neurology service who recommended admission for further work-up, CT head with no acute finding, CTA head and neck, significant for right vertebral artery occlusion, and area of ischemia in left frontal lobe, of unclear acuity, and received full dose aspirin in ED and was admitted to hospital service.  He was seen by neurology.  His aspirin, Plavix and atorvastatin were continued. Interestingly, despite of evidence of acute ischemic noted on the CT cerebral perfusion, MRI is negative.  Patient then had 2 witnessed seizures on 09/15/2018 and he was loaded with Keppra and Keppra was started by neurology.  Assessment & Plan:   Active Problems:   Diabetes mellitus type II, non insulin dependent (HCC)   Stroke (HCC)   HTN (hypertension)   TIA (transient ischemic attack)   Right sided weakness   Acute encephalopathy/right-sided weakness/dysarthria/seizure disorder: Patient slightly more awake today but still confused and has baseline dysarthria.  No focal neurological deficit.  Had seizures x2 yesterday and both of them were witnessed.  Was loaded with Keppra and he continues to be on Keppra 500 mg twice daily by neurology.  He was assessed by PT OT and they recommended CIR however due to not having any stroke on MRI, he did not meet  criteria for CIR.  We will let PT OT reassess him and let neurology see him today before we make any further decisions.  Appreciate neurology help.  Essential hypertension: Blood pressure slightly elevated on diastolic side.  Will resume his amlodipine.  Hyperlipidemia: Continue atorvastatin 40 mg.  Type 2 diabetes mellitus: Blood sugar controlled.  Continue SSI.  Hypokalemia: Once again 3.2.  Replace potassium.   DVT prophylaxis: Heparin Code Status: Full code Family Communication:  None present at bedside. Disposition Plan: To be determined.  Pending further evaluation by PT OT and neurology.  Consultants:   Neurology  Procedures:   None  Antimicrobials:   None   Subjective: Patient seen and examined.  He is slightly more alert compared to yesterday.  Still with dysarthria.  Trying to speak but unable to speak in clear sentences.  No focal neurological deficit.  Objective: Vitals:   09/15/18 1941 09/15/18 2311 09/16/18 0413 09/16/18 0748  BP: (!) 157/83 (!) 170/84 (!) 175/91 (!) 158/91  Pulse: 70 72 71 69  Resp: 18 18 18 18   Temp: 99.5 F (37.5 C) 98.8 F (37.1 C) 98.7 F (37.1 C) 98.9 F (37.2 C)  TempSrc: Oral Oral Oral Oral  SpO2: 100% 98% 100% 100%  Weight:      Height:        Intake/Output Summary (Last 24 hours) at 09/16/2018 1106 Last data filed at 09/16/2018 1042 Gross per 24 hour  Intake 1956.14 ml  Output 1650 ml  Net 306.14 ml   Filed Weights   09/15/18 1600  Weight: 80 kg  Examination: General exam: Appears calm and comfortable but lethargic Respiratory system: Clear to auscultation. Respiratory effort normal. Cardiovascular system: S1 & S2 heard, RRR. No JVD, murmurs, rubs, gallops or clicks. No pedal edema. Gastrointestinal system: Abdomen is nondistended, soft and nontender. No organomegaly or masses felt. Normal bowel sounds heard. Central nervous system: Lethargic. No focal neurological deficits. Extremities: Symmetric 5 x 5  power. Skin: No rashes, lesions or ulcers Psychiatry: Judgement and insight appear poor, mood & affect flat    Data Reviewed: I have personally reviewed following labs and imaging studies  CBC: Recent Labs  Lab 09/14/18 1052 09/14/18 1100 09/15/18 0639 09/16/18 0830  WBC 6.7  --  6.0 5.6  NEUTROABS 5.3  --   --  3.4  HGB 13.4 14.3 12.7* 12.9*  HCT 40.4 42.0 38.1* 38.2*  MCV 89.2  --  88.4 88.4  PLT 157  --  145* [id number]*   Basic Metabolic Panel: Recent Labs  Lab 09/14/18 1052 09/14/18 1100 09/15/18 0639 09/16/18 0830  NA 132* 134* 132* 134*  K 3.4* 3.3* 3.2* 3.2*  CL 98 97* 98 102  CO2 20*  --  24 20*  GLUCOSE 183* 185* 103* 86  BUN 8 10 8 11   CREATININE 0.80 0.70 0.87 0.80  CALCIUM 8.9  --  8.6* 8.5*  MG  --   --   --  1.8   GFR: Estimated Creatinine Clearance: 102.5 mL/min (by C-G formula based on SCr of 0.8 mg/dL). Liver Function Tests: Recent Labs  Lab 09/14/18 1052  AST 23  ALT 26  ALKPHOS 76  BILITOT 1.2  PROT 7.7  ALBUMIN 4.0   No results for input(s): LIPASE, AMYLASE in the last 168 hours. Recent Labs  Lab 09/14/18 1057  AMMONIA 10   Coagulation Profile: Recent Labs  Lab 09/14/18 1052  INR 1.1   Cardiac Enzymes: No results for input(s): CKTOTAL, CKMB, CKMBINDEX, TROPONINI in the last 168 hours. BNP (last 3 results) No results for input(s): PROBNP in the last 8760 hours. HbA1C: Recent Labs    09/14/18 1052  HGBA1C 7.3*   CBG: Recent Labs  Lab 09/15/18 1646 09/15/18 1940 09/16/18 0021 09/16/18 0417 09/16/18 0738  GLUCAP 129* 129* 110* 110* 81   Lipid Profile: Recent Labs    09/15/18 0639  CHOL 93  HDL 39*  LDLCALC 41  TRIG 65  CHOLHDL 2.4   Thyroid Function Tests: No results for input(s): TSH, T4TOTAL, FREET4, T3FREE, THYROIDAB in the last 72 hours. Anemia Panel: No results for input(s): VITAMINB12, FOLATE, FERRITIN, TIBC, IRON, RETICCTPCT in the last 72 hours. Sepsis Labs: No results for input(s): PROCALCITON,  LATICACIDVEN in the last 168 hours.  Recent Results (from the past 240 hour(s))  Novel Coronavirus, NAA (send-out to ref lab)     Status: None   Collection Time: 09/14/18  1:44 PM   Specimen: Nasopharyngeal Swab; Respiratory  Result Value Ref Range Status   SARS-CoV-2, NAA NOT DETECTED NOT DETECTED Final    Comment: (NOTE) This test was developed and its performance characteristics determined by [employer]. This test has not been FDA cleared or approved. This test has been authorized by FDA under an Emergency Use Authorization (EUA). This test is only authorized for the duration of time the declaration that circumstances exist justifying the authorization of the emergency use of in vitro diagnostic tests for detection of SARS-CoV-2 virus and/or diagnosis of COVID-19 infection under section 564(b)(1) of the Act, 21 U.S.C. KA:123727), unless the authorization is terminated or  revoked sooner. When diagnostic testing is negative, the possibility of a false negative result should be considered in the context of a patient's recent exposures and the presence of clinical signs and symptoms consistent with COVID-19. An individual without symptoms of COVID-19 and who is not shedding SARS-CoV-2 virus would expect to have a negative (not detected) result in this assay. Performed  At: [facility] [address] [place], [place] [id number] Rush Farmer MD [id number]    [place]  Final    Comment: Performed at [facility], [place] [address], [place], [place] [address]      Radiology Studies: Ct Angio Head W Or Wo Contrast  Result Date: 09/14/2018 CLINICAL DATA:  Focal neuro deficit, > 6 hrs, stroke suspected R sided weakness; Focal neuro deficit, < 6 hrs, stroke suspected EXAM: CT ANGIOGRAPHY HEAD AND NECK CT PERFUSION BRAIN TECHNIQUE: Multidetector CT imaging of the head and neck was performed using the standard protocol during bolus  administration of intravenous contrast. Multiplanar CT image reconstructions and MIPs were obtained to evaluate the vascular anatomy. Carotid stenosis measurements (when applicable) are obtained utilizing NASCET criteria, using the distal internal carotid diameter as the denominator. Multiphase CT imaging of the brain was performed following IV bolus contrast injection. Subsequent parametric perfusion maps were calculated using RAPID software. CONTRAST:  142mL OMNIPAQUE IOHEXOL 350 MG/ML SOLN COMPARISON:  CT head fifth without contrast 09/14/2018. MRI of the brain 01/27/2018. FINDINGS: CTA NECK FINDINGS Aortic arch: A 3 vessel arch configuration is present. Minimal calcifications are present at the origin of the innominate artery and along the undersurface of the arch. There is no significant stenosis or aneurysm. Right carotid system: The right common carotid artery is within normal limits. Bifurcation is normal. There is some tortuosity of the cervical right ICA without significant stenosis. Left carotid system: The left common carotid artery is tortuous proximally. The bifurcation is unremarkable. There is moderate tortuosity of the cervical left ICA without significant stenosis. Vertebral arteries: The left vertebral artery is hypoplastic. The right vertebral artery is occluded. There is no significant reconstitution of the right vertebral artery in the neck. Skeleton: There straightening of the normal cervical lordosis. No significant listhesis is present. Vertebral body heights are maintained. No focal lytic or blastic lesions are present. Multiple dental caries and periapical lucencies are present within residual mandibular teeth. Other neck: No focal mucosal or submucosal lesions are present. The submandibular and parotid glands and ducts are normal. Thyroid is unremarkable. No significant adenopathy is present. Upper chest: The lung apices are clear. Paraseptal emphysematous changes are noted. Thoracic  inlet is normal. Review of the MIP images confirms the above findings CTA HEAD FINDINGS Anterior circulation: Atherosclerotic calcifications are present within the cavernous internal carotid arteries bilaterally. There is tortuosity and moderate stenosis involving the cavernous right internal carotid artery. The left internal carotid artery is within normal limits through the ICA terminus. The A1 and M1 segments are normal. The anterior communicating artery is patent. Right A1 segment is dominant. MCA bifurcations are within normal limits. There is mild distal branch vessel narrowing involving ACA and MCA branch vessels. There is slight increased vascularity on the left, suggesting the possibility of luxury perfusion. Posterior circulation: The right vertebral artery is reconstituted at the V4 segment. PICA is opacified. A distal V4 stenosis is present. The left vertebral artery is hypoplastic but patent through the vertebrobasilar junction. The basilar artery is small. Posterior communicating arteries are patent bilaterally. The PCA branch vessels are  unremarkable. Small P1 segments are noted bilaterally. Venous sinuses: Dural sinuses are patent. The straight sinus and deep cerebral veins are intact. Cortical veins are unremarkable. Anatomic variants: Prominent posterior communicating arteries bilaterally. Review of the MIP images confirms the above findings CT Brain Perfusion Findings: ASPECTS: 10/10 CBF (<30%) Volume: 21mL Perfusion (Tmax>6.0s) volume: 74mL Mismatch Volume: 20mL Infarction Location:Anterior left frontal lobe. IMPRESSION: 1. Area of ischemia in the anterior left frontal lobe. There is some bilateral increased T-max which is likely artifactual. 2. No significant core infarct. 3. Question luxury perfusion over the left hemisphere consistent with recent ischemia. 4. 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 5. The left vertebral artery is patent. There is flow in the distal  right V4 segment to the posterior communicating artery. 6. 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 7. Prominent posterior communicating arteries bilaterally. 8. No other significant proximal large vessel occlusion. 9. No significant carotid stenosis in the neck. There is mild tortuosity. 10. Minimal atherosclerotic changes at the aortic arch without significant stenosis. These results were called by telephone at the time of interpretation on 09/14/2018 at 1:49 pm to Dr. Samara Snide , who verbally acknowledged these results. Electronically Signed   By: San Morelle M.D.   On: 09/14/2018 13:50   Ct Head Wo Contrast  Result Date: 09/14/2018 CLINICAL DATA:  Patient with right-sided weakness. EXAM: CT HEAD WITHOUT CONTRAST TECHNIQUE: Contiguous axial images were obtained from the base of the skull through the vertex without intravenous contrast. COMPARISON:  Brain CT 01/26/2018 FINDINGS: Brain: Ventricles are prominent compatible with atrophy. No evidence for acute cortically based infarct, intracranial hemorrhage, mass lesion or mass-effect. Old left thalamic lacunar infarct. Vascular: Unremarkable Skull: Intact Sinuses/Orbits: Paranasal sinuses are well aerated. Mastoid air cells are unremarkable. Orbits are unremarkable. Other: None. IMPRESSION: No acute intracranial process. Atrophy and chronic microvascular ischemic changes. Electronically Signed   By: Lovey Newcomer M.D.   On: 09/14/2018 12:08   Ct Angio Neck W And/or Wo Contrast  Result Date: 09/14/2018 CLINICAL DATA:  Focal neuro deficit, > 6 hrs, stroke suspected R sided weakness; Focal neuro deficit, < 6 hrs, stroke suspected EXAM: CT ANGIOGRAPHY HEAD AND NECK CT PERFUSION BRAIN TECHNIQUE: Multidetector CT imaging of the head and neck was performed using the standard protocol during bolus administration of intravenous contrast. Multiplanar CT image reconstructions and MIPs were obtained to evaluate the vascular anatomy.  Carotid stenosis measurements (when applicable) are obtained utilizing NASCET criteria, using the distal internal carotid diameter as the denominator. Multiphase CT imaging of the brain was performed following IV bolus contrast injection. Subsequent parametric perfusion maps were calculated using RAPID software. CONTRAST:  136mL OMNIPAQUE IOHEXOL 350 MG/ML SOLN COMPARISON:  CT head fifth without contrast 09/14/2018. MRI of the brain 01/27/2018. FINDINGS: CTA NECK FINDINGS Aortic arch: A 3 vessel arch configuration is present. Minimal calcifications are present at the origin of the innominate artery and along the undersurface of the arch. There is no significant stenosis or aneurysm. Right carotid system: The right common carotid artery is within normal limits. Bifurcation is normal. There is some tortuosity of the cervical right ICA without significant stenosis. Left carotid system: The left common carotid artery is tortuous proximally. The bifurcation is unremarkable. There is moderate tortuosity of the cervical left ICA without significant stenosis. Vertebral arteries: The left vertebral artery is hypoplastic. The right vertebral artery is occluded. There is no significant reconstitution of the right vertebral artery in the neck. Skeleton: There  straightening of the normal cervical lordosis. No significant listhesis is present. Vertebral body heights are maintained. No focal lytic or blastic lesions are present. Multiple dental caries and periapical lucencies are present within residual mandibular teeth. Other neck: No focal mucosal or submucosal lesions are present. The submandibular and parotid glands and ducts are normal. Thyroid is unremarkable. No significant adenopathy is present. Upper chest: The lung apices are clear. Paraseptal emphysematous changes are noted. Thoracic inlet is normal. Review of the MIP images confirms the above findings CTA HEAD FINDINGS Anterior circulation: Atherosclerotic  calcifications are present within the cavernous internal carotid arteries bilaterally. There is tortuosity and moderate stenosis involving the cavernous right internal carotid artery. The left internal carotid artery is within normal limits through the ICA terminus. The A1 and M1 segments are normal. The anterior communicating artery is patent. Right A1 segment is dominant. MCA bifurcations are within normal limits. There is mild distal branch vessel narrowing involving ACA and MCA branch vessels. There is slight increased vascularity on the left, suggesting the possibility of luxury perfusion. Posterior circulation: The right vertebral artery is reconstituted at the V4 segment. PICA is opacified. A distal V4 stenosis is present. The left vertebral artery is hypoplastic but patent through the vertebrobasilar junction. The basilar artery is small. Posterior communicating arteries are patent bilaterally. The PCA branch vessels are unremarkable. Small P1 segments are noted bilaterally. Venous sinuses: Dural sinuses are patent. The straight sinus and deep cerebral veins are intact. Cortical veins are unremarkable. Anatomic variants: Prominent posterior communicating arteries bilaterally. Review of the MIP images confirms the above findings CT Brain Perfusion Findings: ASPECTS: 10/10 CBF (<30%) Volume: 55mL Perfusion (Tmax>6.0s) volume: 15mL Mismatch Volume: 66mL Infarction Location:Anterior left frontal lobe. IMPRESSION: 1. Area of ischemia in the anterior left frontal lobe. There is some bilateral increased T-max which is likely artifactual. 2. No significant core infarct. 3. Question luxury perfusion over the left hemisphere consistent with recent ischemia. 4. 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 5. The left vertebral artery is patent. There is flow in the distal right V4 segment to the posterior communicating artery. 6. 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  7. Prominent posterior communicating arteries bilaterally. 8. No other significant proximal large vessel occlusion. 9. No significant carotid stenosis in the neck. There is mild tortuosity. 10. Minimal atherosclerotic changes at the aortic arch without significant stenosis. These results were called by telephone at the time of interpretation on 09/14/2018 at 1:49 pm to Dr. Samara Snide , who verbally acknowledged these results. Electronically Signed   By: San Morelle M.D.   On: 09/14/2018 13:50   Mr Brain Wo Contrast  Result Date: 09/15/2018 CLINICAL DATA:  Initial evaluation for right-sided weakness, stroke suspected. EXAM: MRI HEAD WITHOUT CONTRAST TECHNIQUE: Multiplanar, multiecho pulse sequences of the brain and surrounding structures were obtained without intravenous contrast. COMPARISON:  Prior CT and CTA from 09/14/2018 as well as previous brain MRI from 01/27/2018. FINDINGS: Brain: Diffuse prominence of the CSF containing spaces compatible with generalized age-related cerebral atrophy. Patchy and confluent T2/FLAIR hyperintensity within the periventricular deep white matter both cerebral hemispheres most consistent with chronic small vessel ischemic disease, mild in nature. Superimposed remote lacunar infarcts seen involving the bilateral thalami, right caudate head, right paramedian pons, and bilateral cerebellar hemispheres. No abnormal foci of restricted diffusion to suggest acute or subacute ischemia. Specifically, no diffusion abnormality seen to correspond with previously noted perfusion abnormality on prior CT for fusion. Gray-white matter  differentiation maintained. No evidence for acute or chronic intracranial hemorrhage. No mass lesion, midline shift or mass effect. Mild diffuse ventricular prominence related global parenchymal volume loss, stable from previous. No hydrocephalus. No extra-axial fluid collection. Pituitary gland suprasellar region normal. Vascular: Apparent loss of  normal flow void within the left V4 segment (series 10, image 3), likely related to slow flow, as the left V4 segment appears patent on prior CTA. Visualized major intracranial vascular flow voids otherwise maintained. Skull and upper cervical spine: Craniocervical junction within normal limits. Upper cervical spine normal. Visualized bone marrow signal intensity diffusely decreased on T1 weighted imaging, nonspecific, but most commonly related to anemia, smoking, or obesity. No focal marrow replacing lesion. Scalp soft tissues unremarkable. Sinuses/Orbits: Globes and orbital soft tissues demonstrate no acute finding. Remote posttraumatic defect noted at the right lamina for pre shift. Mild scattered mucosal thickening throughout the paranasal sinuses. No air-fluid level to suggest acute sinusitis. No mastoid effusion. Inner ear structures grossly normal. Other: None. IMPRESSION: 1. No acute intracranial infarct or other abnormality identified. 2. Multiple scattered chronic lacunar infarcts involving the right caudate, bilateral thalami, pons, and bilateral cerebellar hemispheres. 3. Moderately advanced cerebral atrophy with mild chronic microvascular ischemic disease. Electronically Signed   By: Jeannine Boga M.D.   On: 09/15/2018 03:11   Ct Cerebral Perfusion W Contrast  Result Date: 09/14/2018 CLINICAL DATA:  Focal neuro deficit, > 6 hrs, stroke suspected R sided weakness; Focal neuro deficit, < 6 hrs, stroke suspected EXAM: CT ANGIOGRAPHY HEAD AND NECK CT PERFUSION BRAIN TECHNIQUE: Multidetector CT imaging of the head and neck was performed using the standard protocol during bolus administration of intravenous contrast. Multiplanar CT image reconstructions and MIPs were obtained to evaluate the vascular anatomy. Carotid stenosis measurements (when applicable) are obtained utilizing NASCET criteria, using the distal internal carotid diameter as the denominator. Multiphase CT imaging of the brain was  performed following IV bolus contrast injection. Subsequent parametric perfusion maps were calculated using RAPID software. CONTRAST:  132mL OMNIPAQUE IOHEXOL 350 MG/ML SOLN COMPARISON:  CT head fifth without contrast 09/14/2018. MRI of the brain 01/27/2018. FINDINGS: CTA NECK FINDINGS Aortic arch: A 3 vessel arch configuration is present. Minimal calcifications are present at the origin of the innominate artery and along the undersurface of the arch. There is no significant stenosis or aneurysm. Right carotid system: The right common carotid artery is within normal limits. Bifurcation is normal. There is some tortuosity of the cervical right ICA without significant stenosis. Left carotid system: The left common carotid artery is tortuous proximally. The bifurcation is unremarkable. There is moderate tortuosity of the cervical left ICA without significant stenosis. Vertebral arteries: The left vertebral artery is hypoplastic. The right vertebral artery is occluded. There is no significant reconstitution of the right vertebral artery in the neck. Skeleton: There straightening of the normal cervical lordosis. No significant listhesis is present. Vertebral body heights are maintained. No focal lytic or blastic lesions are present. Multiple dental caries and periapical lucencies are present within residual mandibular teeth. Other neck: No focal mucosal or submucosal lesions are present. The submandibular and parotid glands and ducts are normal. Thyroid is unremarkable. No significant adenopathy is present. Upper chest: The lung apices are clear. Paraseptal emphysematous changes are noted. Thoracic inlet is normal. Review of the MIP images confirms the above findings CTA HEAD FINDINGS Anterior circulation: Atherosclerotic calcifications are present within the cavernous internal carotid arteries bilaterally. There is tortuosity and moderate stenosis involving the cavernous right internal carotid artery.  The left internal  carotid artery is within normal limits through the ICA terminus. The A1 and M1 segments are normal. The anterior communicating artery is patent. Right A1 segment is dominant. MCA bifurcations are within normal limits. There is mild distal branch vessel narrowing involving ACA and MCA branch vessels. There is slight increased vascularity on the left, suggesting the possibility of luxury perfusion. Posterior circulation: The right vertebral artery is reconstituted at the V4 segment. PICA is opacified. A distal V4 stenosis is present. The left vertebral artery is hypoplastic but patent through the vertebrobasilar junction. The basilar artery is small. Posterior communicating arteries are patent bilaterally. The PCA branch vessels are unremarkable. Small P1 segments are noted bilaterally. Venous sinuses: Dural sinuses are patent. The straight sinus and deep cerebral veins are intact. Cortical veins are unremarkable. Anatomic variants: Prominent posterior communicating arteries bilaterally. Review of the MIP images confirms the above findings CT Brain Perfusion Findings: ASPECTS: 10/10 CBF (<30%) Volume: 40mL Perfusion (Tmax>6.0s) volume: 29mL Mismatch Volume: 78mL Infarction Location:Anterior left frontal lobe. IMPRESSION: 1. Area of ischemia in the anterior left frontal lobe. There is some bilateral increased T-max which is likely artifactual. 2. No significant core infarct. 3. Question luxury perfusion over the left hemisphere consistent with recent ischemia. 4. 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 5. The left vertebral artery is patent. There is flow in the distal right V4 segment to the posterior communicating artery. 6. 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 7. Prominent posterior communicating arteries bilaterally. 8. No other significant proximal large vessel occlusion. 9. No significant carotid stenosis in the neck. There is mild tortuosity. 10. Minimal  atherosclerotic changes at the aortic arch without significant stenosis. These results were called by telephone at the time of interpretation on 09/14/2018 at 1:49 pm to Dr. Samara Snide , who verbally acknowledged these results. Electronically Signed   By: San Morelle M.D.   On: 09/14/2018 13:50   Dg Chest Port 1 View  Result Date: 09/15/2018 CLINICAL DATA:  Aspiration into airway EXAM: PORTABLE CHEST 1 VIEW COMPARISON:  None. FINDINGS: Heart is borderline in size. No confluent airspace opacities, effusions or edema. Low lung volumes. No acute bony abnormality. IMPRESSION: Low volumes.  No active disease. Electronically Signed   By: Rolm Baptise M.D.   On: 09/15/2018 20:27    Scheduled Meds:  aspirin EC  81 mg Oral Daily   atorvastatin  40 mg Oral q1800   clopidogrel  75 mg Oral Q breakfast   heparin  5,000 Units Subcutaneous Q8H   insulin aspart  0-9 Units Subcutaneous Q4H   Continuous Infusions:  sodium chloride 50 mL/hr at 09/14/18 1743   levETIRAcetam 500 mg (09/16/18 0543)     LOS: 0 days   Time spent: 31 minutes.   Darliss Cheney, MD [facility] Pager [phone]  If 7PM-7AM, please contact night-coverage [email or web address] Password [email or web address] 09/16/2018, 11:06 AM

## 2018-09-17 DIAGNOSIS — G40909 Epilepsy, unspecified, not intractable, without status epilepticus: Principal | ICD-10-CM

## 2018-09-17 LAB — BASIC METABOLIC PANEL
Anion gap: 6 (ref 5–15)
BUN: 12 mg/dL (ref 6–20)
CO2: 21 mmol/L — ABNORMAL LOW (ref 22–32)
Calcium: 8.3 mg/dL — ABNORMAL LOW (ref 8.9–10.3)
Chloride: 106 mmol/L (ref 98–111)
Creatinine, Ser: 0.84 mg/dL (ref 0.61–1.24)
GFR calc Af Amer: >60 mL/min (ref >60)
GFR calc non Af Amer: >60 mL/min (ref >60)
Glucose, Bld: 113 mg/dL — ABNORMAL HIGH (ref 70–99)
Potassium: 3.5 mmol/L (ref 3.5–5.1)
Sodium: 133 mmol/L — ABNORMAL LOW (ref 135–145)

## 2018-09-17 LAB — GLUCOSE, CAPILLARY
Glucose-Capillary: 116 mg/dL — ABNORMAL HIGH (ref 70–99)
Glucose-Capillary: 203 mg/dL — ABNORMAL HIGH (ref 70–99)
Glucose-Capillary: 141 mg/dL — ABNORMAL HIGH (ref 70–99)
Glucose-Capillary: 154 mg/dL — ABNORMAL HIGH (ref 70–99)

## 2018-09-17 NOTE — Progress Notes (Signed)
vLTM EEG complete. No skin breakdown 

## 2018-09-17 NOTE — Progress Notes (Signed)
  Speech Language Pathology Treatment: Cognitive-Linquistic(Aphasia)  Patient Details Name: [name] MRN: [id number] DOB: 1960-05-20 Today's Date: 09/17/2018 Time: 1010-1034 SLP Time Calculation (min) (ACUTE ONLY): 24 min  Assessment / Plan / Recommendation Clinical Impression  Pt was seen for aphasia treatment and was cooperative throughout the session. He demonstrated 60% accuracy with confrontational naming increasing to 100% with mod phonemic and orthographic cues. He achieved 20% accuracy with phrase completion increasing to 100% with mod cues. He consistently required re-phrasing and mod cues for accuracy with complex yes/no questions. He identified pictures by description with 60% accuracy increasing to 80% with min cues. SLP will continue to follow pt.    HPI HPI: Pt is a 58 y.o. male with past medical history significant for hypertension, hyperlipidemia, diabetes mellitus, recent admission in January 2020 for acute CVA. Pt presented to the ED s/p fall and denied head trauma, or any pain or injuries but patient presented as a "very poor historian" at that time. MRI was negative for acute changes but showed multiple scattered chronic lacunar infarcts involving the right caudate, bilateral thalami, pons, and bilateral cerebellar hemispheres. EEG showed cortical dysfunction in left hemisphere, maximal left frontotemporal region as well as mild diffuse encephalopathy      SLP Plan  Continue with current plan of care  Patient needs continued Speech Lanaguage Pathology Services    Recommendations                   Follow up Recommendations: Skilled Nursing facility SLP Visit Diagnosis: Aphasia (R47.01) Plan: Continue with current plan of care       [name], [place], [place] Office number [phone] Pager [phone]                [name] 09/17/2018, 10:55 AM
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
## 2018-09-17 NOTE — Procedures (Addendum)
Patient Name: [name]  MRN: [id number]  Epilepsy Attending: Lora Havens  Referring Physician/Provider: Dr Rosalin Hawking Duration: 09/16/2018 1320 to 09/17/2018 10  Patient history:  58 y.o. male with history of  scattered chronic lacunar infarcts involving the right caudate, bilateral thalami, pons, and bilateral cerebellar hemispheres who presented with confusion, right sided weakness, aphasia and recent fall. CT perfusion showed area of ischemia in the anterior left frontal lobe. EEG to evaluate for seizure.   Level of alertness: awake, asleep  Technical aspects: This EEG study was done with scalp electrodes positioned according to the 10-20 International system of electrode placement. Electrical activity was acquired at a sampling rate of 500Hz  and reviewed with a high frequency filter of 70Hz  and a low frequency filter of 1Hz . EEG data were recorded continuously and digitally stored.   DESCRIPTION    State Description Frequency Location Comments  Awake Posterior dominant rhythm 8hz  Posterior head regions, reactive to eye opening and eye closing, asymmetry decreased left   Sleep Vertex waves  Maximal frontocentral   Sleep Spindles 12-14hz  Maximal frontocentral   Sleep K complexes  Maximal frontocentral   Awake Continuous slow 2-3hz  Lateralized left hemisphere, maximal frontotemporal   Awake Intermittent rhythmic slow 2-3Hz  Generalized, maximal left fronto temporal      ABNORMALITY: 1. Background asymmetry, decreased left 2. Continuous slow, left hemisphere, maximal left fronto temporal 3. Intermittent rhythmic slow, generalized, maximal left frontotemporal   IMPRESSION: This study is suggestive of cortical dysfunction in left hemisphere, maximal left frontotemporal region as well as mild diffuse encephalopathy. No seizures or epileptiform discharges were seen throughout the recording.    [name] [name]

## 2018-09-17 NOTE — Progress Notes (Signed)
PROGRESS NOTE    [name]  [id number] DOB: [birth year] DOA: 09/14/2018 PCP: Glendon Axe, MD   Brief Narrative:  [name]  is a 58 y.o. male, with past medical history significant for hypertension, hyperlipidemia, diabetes mellitus, recent admission and January 2020 for acute CVA. apparently patient was noted by his brother to be leaning to the left, patient had unsteady gait yesterday, he was last known normal was yesterday morning, brother found him in the bathroom this morning, he had fallen, patient denied any head trauma, he denies any pain or injuries, himself a poor historian.  - in ED patient was noted to have mild right-sided weakness, seen by neurology service who recommended admission for further work-up, CT head with no acute finding, CTA head and neck, significant for right vertebral artery occlusion, and area of ischemia in left frontal lobe, of unclear acuity, and received full dose aspirin in ED and was admitted to hospital service.  Neurology was consulted.  Interestingly, despite of evidence of acute ischemic noted on the CT cerebral perfusion, MRI of the head is negative.  He was basically admitted for acute encephalopathy.  He then had witnessed seizure activity x2 on 09/15/2026 he was loaded with Keppra and was started on 500 mg twice daily by neurology.  EEG was done which is negative for any seizure.  Patient is currently undergoing LTM EEG.  Assessment & Plan:   Active Problems:   Diabetes mellitus type II, non insulin dependent (HCC)   Stroke (HCC)   HTN (hypertension)   TIA (transient ischemic attack)   Right sided weakness   Seizure disorder ([place])   Acute encephalopathy/right-sided weakness/dysarthria: Slightly more alert than yesterday but is still disoriented.  He is following simple commands.  Still has dysarthria which is likely his baseline due to his recent stroke.  Apparently, he has not had any seizure activity since last 24 hours.  He is  still getting LTM EEG.  SLP, PT OT on board.  Neurology on board.  Appreciate their help.    Essential hypertension: Blood pressure much better.  Continue amlodipine and rest of the medications.  Hyperlipidemia: Continue atorvastatin 40 mg.  Type 2 diabetes mellitus: Blood sugar controlled.  Continue SSI.  Hypokalemia: Resolved.  DVT prophylaxis: Heparin Code Status: Full code Family Communication:  None present at bedside. Disposition Plan: To be determined.  Consultants:   Neurology  Procedures:   None  Antimicrobials:   None   Subjective: Patient seen and examined.  He was more alert than yesterday however remained disoriented.  Has baseline dysarthria.  Objective: Vitals:   09/16/18 1926 09/16/18 2321 09/17/18 0309 09/17/18 0905  BP: (!) 151/84 (!) 152/87 126/79 134/76  Pulse: 65 66 68 67  Resp: 18 18 18 15   Temp: 98.6 F (37 C) 99.1 F (37.3 C) 98.6 F (37 C) 98.3 F (36.8 C)  TempSrc: Oral Oral Oral Oral  SpO2: 100% 100% 100% 100%  Weight:      Height:        Intake/Output Summary (Last 24 hours) at 09/17/2018 0945 Last data filed at 09/17/2018 0935 Gross per 24 hour  Intake 1797.11 ml  Output 1150 ml  Net 647.11 ml   Filed Weights   09/15/18 1600  Weight: 80 kg    Examination:  General exam: Appears calm and comfortable  Respiratory system: Clear to auscultation. Respiratory effort normal. Cardiovascular system: S1 & S2 heard, RRR. No JVD, murmurs, rubs, gallops or clicks. No pedal edema. Gastrointestinal system: Abdomen  is nondistended, soft and nontender. No organomegaly or masses felt. Normal bowel sounds heard. Central nervous system: More alert but completely disoriented.  Weakness in the right upper and lower extremity. Skin: No rashes, lesions or ulcers Psychiatry: Judgement and insight appear poor. Mood & affect flat   Data Reviewed: I have personally reviewed following labs and imaging studies  CBC: Recent Labs  Lab 09/14/18  1052 09/14/18 1100 09/15/18 0639 09/16/18 0830  WBC 6.7  --  6.0 5.6  NEUTROABS 5.3  --   --  3.4  HGB 13.4 14.3 12.7* 12.9*  HCT 40.4 42.0 38.1* 38.2*  MCV 89.2  --  88.4 88.4  PLT 157  --  145* [id number]*   Basic Metabolic Panel: Recent Labs  Lab 09/14/18 1052 09/14/18 1100 09/15/18 0639 09/16/18 0830 09/17/18 0400  NA 132* 134* 132* 134* 133*  K 3.4* 3.3* 3.2* 3.2* 3.5  CL 98 97* 98 102 106  CO2 20*  --  24 20* 21*  GLUCOSE 183* 185* 103* 86 113*  BUN 8 10 8 11 12   CREATININE 0.80 0.70 0.87 0.80 0.84  CALCIUM 8.9  --  8.6* 8.5* 8.3*  MG  --   --   --  1.8  --    GFR: Estimated Creatinine Clearance: 97.6 mL/min (by C-G formula based on SCr of 0.84 mg/dL). Liver Function Tests: Recent Labs  Lab 09/14/18 1052  AST 23  ALT 26  ALKPHOS 76  BILITOT 1.2  PROT 7.7  ALBUMIN 4.0   No results for input(s): LIPASE, AMYLASE in the last 168 hours. Recent Labs  Lab 09/14/18 1057  AMMONIA 10   Coagulation Profile: Recent Labs  Lab 09/14/18 1052  INR 1.1   Cardiac Enzymes: No results for input(s): CKTOTAL, CKMB, CKMBINDEX, TROPONINI in the last 168 hours. BNP (last 3 results) No results for input(s): PROBNP in the last 8760 hours. HbA1C: Recent Labs    09/14/18 1052  HGBA1C 7.3*   CBG: Recent Labs  Lab 09/16/18 1215 09/16/18 1650 09/16/18 1928 09/16/18 2326 09/17/18 0450  GLUCAP 121* 145* 187* 145* 116*   Lipid Profile: Recent Labs    09/15/18 0639  CHOL 93  HDL 39*  LDLCALC 41  TRIG 65  CHOLHDL 2.4   Thyroid Function Tests: No results for input(s): TSH, T4TOTAL, FREET4, T3FREE, THYROIDAB in the last 72 hours. Anemia Panel: No results for input(s): VITAMINB12, FOLATE, FERRITIN, TIBC, IRON, RETICCTPCT in the last 72 hours. Sepsis Labs: No results for input(s): PROCALCITON, LATICACIDVEN in the last 168 hours.  Recent Results (from the past 240 hour(s))  Novel Coronavirus, NAA (send-out to ref lab)     Status: None   Collection Time: 09/14/18   1:44 PM   Specimen: Nasopharyngeal Swab; Respiratory  Result Value Ref Range Status   SARS-CoV-2, NAA NOT DETECTED NOT DETECTED Final    Comment: (NOTE) This test was developed and its performance characteristics determined by [employer]. This test has not been FDA cleared or approved. This test has been authorized by FDA under an Emergency Use Authorization (EUA). This test is only authorized for the duration of time the declaration that circumstances exist justifying the authorization of the emergency use of in vitro diagnostic tests for detection of SARS-CoV-2 virus and/or diagnosis of COVID-19 infection under section 564(b)(1) of the Act, 21 U.S.C. [id number]), unless the authorization is terminated or revoked sooner. When diagnostic testing is negative, the possibility of a false negative result should be considered in the context of a  patient's recent exposures and the presence of clinical signs and symptoms consistent with COVID-19. An individual without symptoms of COVID-19 and who is not shedding SARS-CoV-2 virus would expect to have a negative (not detected) result in this assay. Performed  At: [facility] [address] [place], [place] [id number] Rush Farmer MD [id number]    [place]  Final    Comment: Performed at [facility], [place] [address], [place], [place] [address]      Radiology Studies: Dg Chest [facility] 1 View  Result Date: 09/15/2018 CLINICAL DATA:  Aspiration into airway EXAM: PORTABLE CHEST 1 VIEW COMPARISON:  None. FINDINGS: Heart is borderline in size. No confluent airspace opacities, effusions or edema. Low lung volumes. No acute bony abnormality. IMPRESSION: Low volumes.  No active disease. Electronically Signed   By: Rolm Baptise M.D.   On: 09/15/2018 20:27    Scheduled Meds: . amLODipine  2.5 mg Oral Daily  . aspirin EC  81 mg Oral Daily  . atorvastatin  40 mg Oral q1800  . clopidogrel  75 mg  Oral Q breakfast  . heparin  5,000 Units Subcutaneous Q8H  . insulin aspart  0-9 Units Subcutaneous Q4H  . tamsulosin  0.4 mg Oral QPC supper   Continuous Infusions: . sodium chloride 50 mL/hr at 09/17/18 0935  . levETIRAcetam 500 mg (09/17/18 0456)     LOS: 1 day   Time spent: 30 minutes.   Darliss Cheney, MD [facility] Pager [phone]  If 7PM-7AM, please contact night-coverage [email or web address] Password [facility] 09/17/2018, 9:45 AM

## 2018-09-17 NOTE — Plan of Care (Signed)
Progressing towards goals

## 2018-09-17 NOTE — Progress Notes (Signed)
STROKE TEAM PROGRESS NOTE   INTERVAL HISTORY Pt lying in bed. Sister came in visiting him stated that at baseline, pt is talking walking coherent without significant neuro deficit from previous stroke. LTM EEG negative, will d/c. Will also repeat MRI DWI.   Vitals:   09/16/18 1926 09/16/18 2321 09/17/18 0309 09/17/18 0905  BP: (!) 151/84 (!) 152/87 126/79 134/76  Pulse: 65 66 68 67  Resp: 18 18 18 15   Temp: 98.6 F (37 C) 99.1 F (37.3 C) 98.6 F (37 C) 98.3 F (36.8 C)  TempSrc: Oral Oral Oral Oral  SpO2: 100% 100% 100% 100%  Weight:      Height:        CBC:  Recent Labs  Lab 09/14/18 1052  09/15/18 0639 09/16/18 0830  WBC 6.7  --  6.0 5.6  NEUTROABS 5.3  --   --  3.4  HGB 13.4   < > 12.7* 12.9*  HCT 40.4   < > 38.1* 38.2*  MCV 89.2  --  88.4 88.4  PLT 157  --  145* 147*   < > = values in this interval not displayed.    Basic Metabolic Panel:  Recent Labs  Lab 09/16/18 0830 09/17/18 0400  NA 134* 133*  K 3.2* 3.5  CL 102 106  CO2 20* 21*  GLUCOSE 86 113*  BUN 11 12  CREATININE 0.80 0.84  CALCIUM 8.5* 8.3*  MG 1.8  --    Lipid Panel:     Component Value Date/Time   CHOL 93 09/15/2018 0639   TRIG 65 09/15/2018 0639   HDL 39 (L) 09/15/2018 0639   CHOLHDL 2.4 09/15/2018 0639   VLDL 13 09/15/2018 0639   LDLCALC 41 09/15/2018 0639   HgbA1c:  Lab Results  Component Value Date   HGBA1C 7.3 (H) 09/14/2018   Urine Drug Screen:     Component Value Date/Time   LABOPIA NONE DETECTED 09/14/2018 1715   COCAINSCRNUR NONE DETECTED 09/14/2018 1715   LABBENZ NONE DETECTED 09/14/2018 1715   AMPHETMU NONE DETECTED 09/14/2018 1715   THCU NONE DETECTED 09/14/2018 1715   LABBARB NONE DETECTED 09/14/2018 1715    Alcohol Level     Component Value Date/Time   ETH <10 09/14/2018 1052    IMAGING Ct Head Wo Contrast 09/14/2018 IMPRESSION:  No acute intracranial process. Atrophy and chronic microvascular ischemic changes.   Ct Angio Head W Or Wo Contrast Ct  Angio Neck W And/or Wo Contrast Ct Cerebral Perfusion W Contrast 09/14/2018  IMPRESSION:  1. Area of ischemia in the anterior left frontal lobe. There is some bilateral increased T-max which is likely artifactual.  2. No significant core infarct.  3. Question luxury perfusion over the left hemisphere consistent with recent ischemia.  4. 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  5. The left vertebral artery is patent. There is flow in the distal right V4 segment to the posterior communicating artery.  6. 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  7. Prominent posterior communicating arteries bilaterally.  8. No other significant proximal large vessel occlusion.  9. No significant carotid stenosis in the neck. There is mild tortuosity.  10. Minimal atherosclerotic changes at the aortic arch without significant stenosis.    Mr Brain Wo Contrast 09/15/2018 IMPRESSION:  1. No acute intracranial infarct or other abnormality identified.  2. Multiple scattered chronic lacunar infarcts involving the right caudate, bilateral thalami, pons, and bilateral cerebellar hemispheres.  3. Moderately advanced cerebral  atrophy with mild chronic microvascular ischemic disease.   Transthoracic Echocardiogram  1. The left ventricle has normal systolic function, with an ejection fraction of 55-60%. The cavity size was normal. Left ventricular diastolic Doppler parameters are indeterminate. No evidence of left ventricular regional wall motion abnormalities. 2. The right ventricle has normal systolic function. The cavity was normal. There is no increase in right ventricular wall thickness. Right ventricular systolic pressure could not be assessed. 3. Left atrial size was mildly dilated. 4. The aortic valve is tricuspid. Mild calcification of the aortic valve. The noncoronary cusp is mildly thickened and restricted in motion. Aortic valve regurgitation is mild  by color flow Doppler. Mild aortic annular calcification noted. 5. The mitral valve is grossly normal. There is mild mitral annular calcification present. 6. The tricuspid valve is grossly normal. 7. The aorta is normal unless otherwise noted.  ECG - SR rate 71 BPM. (See cardiology reading for complete details)  EEG - ThisEEG recorded evidence of a non-specific lefthemispheric dysfunction. There was no seizure or predisposition seen.Please note that lack of epileptiform activity on EEG does not preclude the possibility of epilepsy.   LTM EEG 1. Background asymmetry, decreased left 2. Continuous slow, left hemisphere, maximal left fronto temporal 3. Intermittent rhythmic slow, generalized, maximal left frontotemporal IMPRESSION: This study is suggestive of cortical dysfunction in left hemisphere, maximal left frontotemporal region as well as mild diffuse encephalopathy. No seizures or epileptiform discharges were seen throughout the recording.   PHYSICAL EXAM   Vitals:   09/16/18 1926 09/16/18 2321 09/17/18 0309 09/17/18 0905  BP: (!) 151/84 (!) 152/87 126/79 134/76  Pulse: 65 66 68 67  Resp: 18 18 18 15   Temp: 98.6 F (37 C) 99.1 F (37.3 C) 98.6 F (37 C) 98.3 F (36.8 C)  TempSrc: Oral Oral Oral Oral  SpO2: 100% 100% 100% 100%  Weight:      Height:        General - Well nourished, well developed, in no apparent distress.  Ophthalmologic - fundi not visualized due to noncooperation.  Cardiovascular - Regular rate and rhythm.  Neuro - pt awake alert, eyes open. Still has expressive aphasia only able to tell me his name, able to repeat 5 word sentences, but not long sentence, naming 1/5, no spontaneous speech, only able to close eyes on request but no other commands. Blinking to visual threat consistent on the left but not consistent to the right. Bilateral gaze intact, tracking b/l. PERRL. No facial asymmetry. Moving all extremities equally. Coordination and  sensation not cooperative, gait not tested.  ASSESSMENT/PLAN Mr. Trevor Herrera is a 58 y.o. male with history of hypertension, diabetes mellitus, tobacco use, aspvd, hyperlipidemia, acute stroke in January 2020 presenting with confusion, right sided weakness, aphasia and recent fall. He did not receive IV t-PA due to late presentation (>4.5 hours from time of onset).  Seizure with prolonged post ictal state - likely related to hx of stroke  CT head  No acute abnormality. Small vessel disease. Atrophy.     CTA head & neck occluded R VA. Mod R cavernous ICA stenosis. Mild tortuosity in the neck. Minimal atherosclerosis aortic arch.   CT perfusion anterior L frontal lobe ischemia. No core infarct. ? Luxury perfusion L brain c/w recent ischemia.   MRI  No acute infarct. Mult old lacunes R caudate head, B thalamic, pons, and B cerebellum. Moderately advanced strophy and small vessel disease.   2D Echo EF 55-60%. No source of embolus  Spot EEG - left slowing, no seizure  LTM EEG - L cortical dysfxn. Encephalopathy. No sz.   MRI DWI repeat pending  Lacey Jensen Virus 2 novel coronavirus - negative  LDL 41  HgbA1c 7.3  Heparin 5000 units sq tid for VTE prophylaxis  aspirin 81 mg daily and clopidogrel 75 mg daily prior to admission, now on aspirin 81 mg daily and clopidogrel 75 mg daily. continue at d/c.   Had keppra 1.5 g load, now on keppra 500mg  bid   Therapy recommendations:  CIR  Disposition:  pending   Hx of stroke  01/2018 admitted for dizziness and gait imbalance.  MRI showed left pontine infarct and old left thalamic infarct.  MRA showed right VA occlusion.  Carotid Doppler negative.  EF 55 to 60%.  LDL 84 and A1c 7.3.  He was put on DAPT and Lipitor 40.  Fever, resolved  Tmax 100 -> afebrile  Pt had cough after seizure episode  Concerning for aspiration  CXR NAD   Hypertension  Home BP meds: Norvasc 5 mg daily  Current BP meds: none  Stable  Long-term  BP goal normotensive  Hyperlipidemia  Home Lipid lowering medication: Lipitor 40 mg daily  LDL 41, goal < 70  Current lipid lowering medication: Lipitor 40 mg daily  Continue statin at discharge  Diabetes  Home diabetic meds: Metformin 1,000 mg Bid  HgbA1c 7.3, goal < 7.0  SSI  CBG monitoring  PCP close follow up  Tobacco abuse  Current smoker  Smoking cessation counseling provided  Pt is willing to quit  Other Stroke Risk Factors    Other Active Problems  Hypokalemia - 3.4->3.3->3.2 supplemented - 3.5  Hyponatremia - 132 ->133  Mild thrombocytopenia 147  Hospital day # 1  I had long discussion with sister at bedside, updated pt current condition, treatment plan and potential prognosis. She expressed understanding and appreciation.   Rosalin Hawking, MD PhD Stroke Neurology 09/17/2018 1:20 PM   To contact Stroke Continuity provider, please refer to [email or web address] After hours, contact General Neurology

## 2018-09-18 ENCOUNTER — Inpatient Hospital Stay (HOSPITAL_COMMUNITY): Payer: Self-pay

## 2018-09-18 LAB — GLUCOSE, CAPILLARY
Glucose-Capillary: 121 mg/dL — ABNORMAL HIGH (ref 70–99)
Glucose-Capillary: 129 mg/dL — ABNORMAL HIGH (ref 70–99)
Glucose-Capillary: 142 mg/dL — ABNORMAL HIGH (ref 70–99)
Glucose-Capillary: 155 mg/dL — ABNORMAL HIGH (ref 70–99)
Glucose-Capillary: 175 mg/dL — ABNORMAL HIGH (ref 70–99)
Glucose-Capillary: 179 mg/dL — ABNORMAL HIGH (ref 70–99)
Glucose-Capillary: 94 mg/dL (ref 70–99)

## 2018-09-18 NOTE — Progress Notes (Signed)
PROGRESS NOTE    [name]  [id number] DOB: [birth year] DOA: 09/14/2018 PCP: Glendon Axe, MD    Brief Narrative:  58 year old male who presented with focal neurologic deficit.  Patient does have significant past medical history for hypertension, dyslipidemia, type II days mellitus and CVA.  24 hours prior to admission patient was found leaning toward his left side, associated with unsteady gait and falls.  On his initial physical examination blood pressure 179/112, heart rate 77, temperature 98.1, respiratory rate 23, oxygen saturation 100%.  He was awake and alert, mildly confused.  Slurred speech, right upper extremity strength 4 out of 5.  His lungs were clear to auscultation bilaterally, heart S1-S2 present and rhythm, his abdomen was soft nontender, L extremity edema.  Head CT with no acute changes.  CT angiography with ischemia in the anterior left frontal lobe.  Occluded right vertebral artery.  71 bpm, normal axis, normal intervals, sinus rhythm, J-point elevation in V1 and V2, no T wave changes.  Patient was admitted to the hospital with working diagnosis of acute focal neurologic deficit, rule out CVA.  Further work-up revealed negative ischemia.  Brain MRI.  Patient hospitalization was complicated by witnessed seizure x2.  Patient has received a load of Keppra.  Assessment & Plan:   Active Problems:   Diabetes mellitus type II, non insulin dependent (HCC)   Stroke (HCC)   HTN (hypertension)   TIA (transient ischemic attack)   Right sided weakness   Seizure disorder ([place])  1. Acute metabolic encephalopathy complicated with seizures. Today patient is more awake and alert, non focal, his sister at the bedside, reports patient close to baseline. No recurrent seizure. Continue with Keppra 500 mg bid per neurology recommendations. Physical therapy recommends SNF. Discontinue IV fluids.    2. CVA/ old lacunar right caudate head, thalamic, pons, and cerebellum. Patient  clinically at his baseline, will continue dual antiplatelet therapy with asa and clopidogrel. Physical therapy evaluation. Continue statin therapy.   3. HTN. Continue blood pressure control with amlodipine.   4. T2DM. Continue glucose cover and monitoring with insulin sliding scale.   5. Tobacco abuse. Continue smoking cessation.   DVT prophylaxis: enoxaparin   Code Status: full Family Communication: I spoke with patient's sister at the bedside and all questions were addressed.  Disposition Plan/ discharge barriers: pending final neurology recommendations.   Body mass index is 28.47 kg/m. Malnutrition Type:      Malnutrition Characteristics:      Nutrition Interventions:     RN Pressure Injury Documentation:     Consultants:   neurology   Procedures:     Antimicrobials:       Subjective: Patient clinically improving, mentally close to baseline per her sister at the bedside. No further seizures. Patient continue to be weak and deconditioned not yet back to baseline.   Objective: Vitals:   09/17/18 1940 09/17/18 2338 09/18/18 0348 09/18/18 0722  BP: (!) 157/87 (!) 154/77 (!) 158/84 (!) 144/77  Pulse: 64 63 61 64  Resp: 19 18 18 16   Temp: 99 F (37.2 C) 98.2 F (36.8 C) 98.2 F (36.8 C) 98.5 F (36.9 C)  TempSrc: Oral Oral Oral Oral  SpO2: 100% 99% 100% 99%  Weight:      Height:        Intake/Output Summary (Last 24 hours) at 09/18/2018 1204 Last data filed at 09/18/2018 1022 Gross per 24 hour  Intake 240 ml  Output 2050 ml  Net -1810 ml   [employer]  09/15/18 1600  Weight: 80 kg    Examination:   General: Not in pain or dyspnea, deconditioned  Neurology: Awake and alert, mild confusion, non focal and no dysarthria.  E ENT: no pallor, no icterus, oral mucosa moist Cardiovascular: No JVD. S1-S2 present, rhythmic, no gallops, rubs, or murmurs. No lower extremity edema. Pulmonary: positive breath sounds bilaterally, adequate air  movement, no wheezing, rhonchi or rales. Gastrointestinal. Abdomen with no organomegaly, non tender, no rebound or guarding Skin. No rashes Musculoskeletal: no joint deformities     Data Reviewed: I have personally reviewed following labs and imaging studies  CBC: Recent Labs  Lab 09/14/18 1052 09/14/18 1100 09/15/18 0639 09/16/18 0830  WBC 6.7  --  6.0 5.6  NEUTROABS 5.3  --   --  3.4  HGB 13.4 14.3 12.7* 12.9*  HCT 40.4 42.0 38.1* 38.2*  MCV 89.2  --  88.4 88.4  PLT 157  --  145* [id number]*   Basic Metabolic Panel: Recent Labs  Lab 09/14/18 1052 09/14/18 1100 09/15/18 0639 09/16/18 0830 09/17/18 0400  NA 132* 134* 132* 134* 133*  K 3.4* 3.3* 3.2* 3.2* 3.5  CL 98 97* 98 102 106  CO2 20*  --  24 20* 21*  GLUCOSE 183* 185* 103* 86 113*  BUN 8 10 8 11 12   CREATININE 0.80 0.70 0.87 0.80 0.84  CALCIUM 8.9  --  8.6* 8.5* 8.3*  MG  --   --   --  1.8  --    GFR: Estimated Creatinine Clearance: 97.6 mL/min (by C-G formula based on SCr of 0.84 mg/dL). Liver Function Tests: Recent Labs  Lab 09/14/18 1052  AST 23  ALT 26  ALKPHOS 76  BILITOT 1.2  PROT 7.7  ALBUMIN 4.0   No results for input(s): LIPASE, AMYLASE in the last 168 hours. Recent Labs  Lab 09/14/18 1057  AMMONIA 10   Coagulation Profile: Recent Labs  Lab 09/14/18 1052  INR 1.1   Cardiac Enzymes: No results for input(s): CKTOTAL, CKMB, CKMBINDEX, TROPONINI in the last 168 hours. BNP (last 3 results) No results for input(s): PROBNP in the last 8760 hours. HbA1C: No results for input(s): HGBA1C in the last 72 hours. CBG: Recent Labs  Lab 09/17/18 1622 09/17/18 1942 09/18/18 0013 09/18/18 0351 09/18/18 0859  GLUCAP 141* 154* 121* 94 179*   Lipid Profile: No results for input(s): CHOL, HDL, LDLCALC, TRIG, CHOLHDL, LDLDIRECT in the last 72 hours. Thyroid Function Tests: No results for input(s): TSH, T4TOTAL, FREET4, T3FREE, THYROIDAB in the last 72 hours. Anemia Panel: No results for  input(s): VITAMINB12, FOLATE, FERRITIN, TIBC, IRON, RETICCTPCT in the last 72 hours.    Radiology Studies: I have reviewed all of the imaging during this hospital visit personally     Scheduled Meds: . amLODipine  2.5 mg Oral Daily  . aspirin EC  81 mg Oral Daily  . atorvastatin  40 mg Oral q1800  . clopidogrel  75 mg Oral Q breakfast  . heparin  5,000 Units Subcutaneous Q8H  . insulin aspart  0-9 Units Subcutaneous Q4H  . tamsulosin  0.4 mg Oral QPC supper   Continuous Infusions: . sodium chloride 50 mL/hr at 09/18/18 0420  . levETIRAcetam 500 mg (09/18/18 0503)     LOS: 2 days        Mauricio Gerome Apley, MD

## 2018-09-18 NOTE — Progress Notes (Signed)
STROKE TEAM PROGRESS NOTE   INTERVAL HISTORY Pt sitting in bed, sister and RN at bedside. Sister is feeding him lunch. He apparently much improved cognitively than yesterday, able to work with PT/OT and now recommend [facility] PT/OT. He is able to tell me his name and hospital. RN told me he was able to tell the month and year earlier. MRI again showed no stroke.   Vitals:   09/18/18 0348 09/18/18 0722 09/18/18 1411 09/18/18 1649  BP: (!) 158/84 (!) 144/77 (!) 156/85 (!) 149/85  Pulse: 61 64 65 72  Resp: 18 16 16    Temp: 98.2 F (36.8 C) 98.5 F (36.9 C)  98.3 F (36.8 C)  TempSrc: Oral Oral  Oral  SpO2: 100% 99%  100%  Weight:      Height:        CBC:  Recent Labs  Lab 09/14/18 1052  09/15/18 0639 09/16/18 0830  WBC 6.7  --  6.0 5.6  NEUTROABS 5.3  --   --  3.4  HGB 13.4   < > 12.7* 12.9*  HCT 40.4   < > 38.1* 38.2*  MCV 89.2  --  88.4 88.4  PLT 157  --  145* 147*   < > = values in this interval not displayed.    Basic Metabolic Panel:  Recent Labs  Lab 09/16/18 0830 09/17/18 0400  NA 134* 133*  K 3.2* 3.5  CL 102 106  CO2 20* 21*  GLUCOSE 86 113*  BUN 11 12  CREATININE 0.80 0.84  CALCIUM 8.5* 8.3*  MG 1.8  --    Lipid Panel:     Component Value Date/Time   CHOL 93 09/15/2018 0639   TRIG 65 09/15/2018 0639   HDL 39 (L) 09/15/2018 0639   CHOLHDL 2.4 09/15/2018 0639   VLDL 13 09/15/2018 0639   LDLCALC 41 09/15/2018 0639   HgbA1c:  Lab Results  Component Value Date   HGBA1C 7.3 (H) 09/14/2018   Urine Drug Screen:     Component Value Date/Time   LABOPIA NONE DETECTED 09/14/2018 1715   COCAINSCRNUR NONE DETECTED 09/14/2018 1715   LABBENZ NONE DETECTED 09/14/2018 1715   AMPHETMU NONE DETECTED 09/14/2018 1715   THCU NONE DETECTED 09/14/2018 1715   LABBARB NONE DETECTED 09/14/2018 1715    Alcohol Level     Component Value Date/Time   ETH <10 09/14/2018 1052    IMAGING Ct Head Wo Contrast 09/14/2018 IMPRESSION:  No acute intracranial process.  Atrophy and chronic microvascular ischemic changes.   Ct Angio Head W Or Wo Contrast Ct Angio Neck W And/or Wo Contrast Ct Cerebral Perfusion W Contrast 09/14/2018  IMPRESSION:  1. Area of ischemia in the anterior left frontal lobe. There is some bilateral increased T-max which is likely artifactual.  2. No significant core infarct.  3. Question luxury perfusion over the left hemisphere consistent with recent ischemia.  4. 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  5. The left vertebral artery is patent. There is flow in the distal right V4 segment to the posterior communicating artery.  6. 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  7. Prominent posterior communicating arteries bilaterally.  8. No other significant proximal large vessel occlusion.  9. No significant carotid stenosis in the neck. There is mild tortuosity.  10. Minimal atherosclerotic changes at the aortic arch without significant stenosis.    Mr Brain Wo Contrast 09/15/2018 IMPRESSION:  1. No acute intracranial infarct or other abnormality identified.  2. Multiple scattered chronic lacunar infarcts involving the right caudate, bilateral thalami, pons, and bilateral cerebellar hemispheres.  3. Moderately advanced cerebral atrophy with mild chronic microvascular ischemic disease.   Transthoracic Echocardiogram  1. The left ventricle has normal systolic function, with an ejection fraction of 55-60%. The cavity size was normal. Left ventricular diastolic Doppler parameters are indeterminate. No evidence of left ventricular regional wall motion abnormalities. 2. The right ventricle has normal systolic function. The cavity was normal. There is no increase in right ventricular wall thickness. Right ventricular systolic pressure could not be assessed. 3. Left atrial size was mildly dilated. 4. The aortic valve is tricuspid. Mild calcification of the aortic valve. The  noncoronary cusp is mildly thickened and restricted in motion. Aortic valve regurgitation is mild by color flow Doppler. Mild aortic annular calcification noted. 5. The mitral valve is grossly normal. There is mild mitral annular calcification present. 6. The tricuspid valve is grossly normal. 7. The aorta is normal unless otherwise noted.  ECG - SR rate 71 BPM. (See cardiology reading for complete details)  EEG - ThisEEG recorded evidence of a non-specific lefthemispheric dysfunction. There was no seizure or predisposition seen.Please note that lack of epileptiform activity on EEG does not preclude the possibility of epilepsy.   LTM EEG 1. Background asymmetry, decreased left 2. Continuous slow, left hemisphere, maximal left fronto temporal 3. Intermittent rhythmic slow, generalized, maximal left frontotemporal IMPRESSION: This study is suggestive of cortical dysfunction in left hemisphere, maximal left frontotemporal region as well as mild diffuse encephalopathy. No seizures or epileptiform discharges were seen throughout the recording.  Mr Brain Wo Contrast  Result Date: 09/18/2018 CLINICAL DATA:  Stroke follow-up.  Dizziness and gait imbalance. EXAM: MRI HEAD WITHOUT CONTRAST TECHNIQUE: Multiplanar, multiecho pulse sequences of the brain and surrounding structures were obtained without intravenous contrast. COMPARISON:  09/15/2018 FINDINGS: A limited examination was performed at the request of the ordering neurologist's consisting of only axial and coronal diffusion imaging. No acute infarct is identified. A subcentimeter focus of mild trace diffusion hyperintensity in the posterior right frontal lobe on axial imaging (series 3, image 41) is not confirmed on coronal imaging and was also present on the prior study which showed some associated T2/FLAIR hyperintensity, and this is favored to reflect an old infarct. Chronic changes including multiple old infarcts elsewhere and cerebral  atrophy were more fully evaluated on the recent prior complete brain MRI. IMPRESSION: Diffusion only brain MRI without an acute infarct identified. Electronically Signed   By: Logan Bores M.D.   On: 09/18/2018 13:53    PHYSICAL EXAM   Vitals:   09/18/18 0348 09/18/18 0722 09/18/18 1411 09/18/18 1649  BP: (!) 158/84 (!) 144/77 (!) 156/85 (!) 149/85  Pulse: 61 64 65 72  Resp: 18 16 16    Temp: 98.2 F (36.8 C) 98.5 F (36.9 C)  98.3 F (36.8 C)  TempSrc: Oral Oral  Oral  SpO2: 100% 99%  100%  Weight:      Height:        General - Well nourished, well developed, in no apparent distress.  Ophthalmologic - fundi not visualized due to noncooperation.  Cardiovascular - Regular rate and rhythm.  Neuro - pt awake alert, eyes open. improved aphasia able to tell me his name, [place] and time, able to repeat sentences, naming 3/5, following central commands. Blinking to visual threat bilaterally. Bilateral gaze intact, tracking b/l. PERRL. No facial asymmetry. Moving all extremities equally. Coordination and sensation not cooperative, gait  not tested.  ASSESSMENT/PLAN [name] is a 58 y.o. male with history of hypertension, diabetes mellitus, tobacco use, aspvd, hyperlipidemia, acute stroke in January 2020 presenting with confusion, right sided weakness, aphasia and recent fall. He did not receive IV t-PA due to late presentation (>4.5 hours from time of onset).  Seizure with prolonged post ictal state - likely related to hx of stroke  CT head  No acute abnormality. Small vessel disease. Atrophy.     CTA head & neck occluded R VA. Mod R cavernous ICA stenosis. Mild tortuosity in the neck. Minimal atherosclerosis aortic arch.   CT perfusion anterior L frontal lobe ischemia. No core infarct. ? Luxury perfusion L brain c/w recent ischemia.   MRI  No acute infarct. Mult old lacunes R caudate head, B thalamic, pons, and B cerebellum. Moderately advanced strophy and small vessel  disease.   2D Echo EF 55-60%. No source of embolus   Spot EEG - left slowing, no seizure  LTM EEG - L cortical dysfxn. Encephalopathy. No sz.   MRI DWI repeat no acute stroke  [employer] Virus 2 novel coronavirus - negative  LDL 41  HgbA1c 7.3  Heparin 5000 units sq tid for VTE prophylaxis  aspirin 81 mg daily and clopidogrel 75 mg daily prior to admission, now on aspirin 81 mg daily and clopidogrel 75 mg daily. continue at d/c.   Had keppra 1.5 g load, now on keppra 500mg  bid   Therapy recommendations:  CIR  Disposition:  pending   Hx of stroke  01/2018 admitted for dizziness and gait imbalance.  MRI showed left pontine infarct and old left thalamic infarct.  MRA showed right VA occlusion.  Carotid Doppler negative.  EF 55 to 60%.  LDL 84 and A1c 7.3.  He was put on DAPT and Lipitor 40.  Fever, resolved  Tmax 100 -> afebrile  Pt had cough after seizure episode  Concerning for aspiration  CXR NAD   Hypertension  Home BP meds: Norvasc 5 mg daily  Current BP meds: none  Stable  Long-term BP goal normotensive  Hyperlipidemia  Home Lipid lowering medication: Lipitor 40 mg daily  LDL 41, goal < 70  Current lipid lowering medication: Lipitor 40 mg daily  Continue statin at discharge  Diabetes  Home diabetic meds: Metformin 1,000 mg Bid  HgbA1c 7.3, goal < 7.0  SSI  CBG monitoring  PCP close follow up  Tobacco abuse  Current smoker  Smoking cessation counseling provided  Pt is willing to quit  Other Stroke Risk Factors    Other Active Problems  Hypokalemia - 3.4->3.3->3.2 supplemented - 3.5  Hyponatremia - 132 ->133  Mild thrombocytopenia 147  Hospital day # 2   Rosalin Hawking, MD PhD Stroke Neurology 09/18/2018 7:03 PM   To contact Stroke Continuity provider, please refer to [email or web address] After hours, contact General Neurology

## 2018-09-18 NOTE — Progress Notes (Signed)
Physical Therapy Treatment Patient Details Name: [name] MRN: [id number] DOB: 05-23-1960 Today's Date: 09/18/2018    History of Present Illness 58 y.o. male with past medical history significant for hypertension, diabetes mellitus, hyperlipidemia, acute stroke in January 2020 presents to the emergency department with altered mental status and right-sided weakness and leaning to the right side.  MRI negative for stroke.  EEG pending for possible post-ictal state weakness.     PT Comments    Pt is much better than last session.  His cognition has improved (although not normal), processing speed and command following are better.  He is aware that he is unsteady on his feet.  He reports he lives with a brother who can provide light physical care.  PT will continue to follow acutely for safe mobility progression   Follow Up Recommendations  Home health PT;Supervision/Assistance - 24 hour     Equipment Recommendations  None recommended by PT(pt reports he has a walker and a cane)    Recommendations for Other Services   NA     Precautions / Restrictions Precautions Precautions: Fall Precaution Comments: mildly unsteady on his feet    Mobility  Bed Mobility Overal bed mobility: Needs Assistance Bed Mobility: Supine to Sit     Supine to sit: Min guard     General bed mobility comments: Min guard assist for safety during transitions.   Transfers Overall transfer level: Needs assistance Equipment used: 1 person hand held assist Transfers: Sit to/from Stand Sit to Stand: Min guard         General transfer comment: Min guard assit for safety and balance.   Ambulation/Gait Ambulation/Gait assistance: Min guard Gait Distance (Feet): 150 Feet Assistive device: 1 person hand held assist Gait Pattern/deviations: Step-through pattern;Staggering left;Staggering right     General Gait Details: Pt with mildly staggering gait pattern and reports he is not "quite right".             Balance Overall balance assessment: Needs assistance Sitting-balance support: No upper extremity supported;Feet supported Sitting balance-Leahy Scale: Good     Standing balance support: Single extremity supported Standing balance-Leahy Scale: Poor Standing balance comment: needs external support in standing (light).                            Cognition Arousal/Alertness: Awake/alert Behavior During Therapy: WFL for tasks assessed/performed Overall Cognitive Status: Impaired/Different from baseline Area of Impairment: Problem solving                 Orientation Level: Situation Current Attention Level: Selective Memory: Decreased short-term memory Following Commands: Follows one step commands with increased time   Awareness: Emergent Problem Solving: Slow processing General Comments: Pt's processing speed is much better than last session, but remains a bit slow.  He is oriented to where he is, but not why.             PT Goals (current goals can now be found in the care plan section) Acute Rehab PT Goals Patient Stated Goal: to go home tomorrow with his brother. Progress towards PT goals: Progressing toward goals    Frequency    Min 3X/week      PT Plan Discharge plan needs to be updated       AM-PAC PT "6 Clicks" Mobility   Outcome Measure  Help needed turning from your back to your side while in a flat bed without using bedrails?: A Little Help needed  moving from lying on your back to sitting on the side of a flat bed without using bedrails?: A Little Help needed moving to and from a bed to a chair (including a wheelchair)?: A Little Help needed standing up from a chair using your arms (e.g., wheelchair or bedside chair)?: A Little Help needed to walk in hospital room?: A Little Help needed climbing 3-5 steps with a railing? : A Little 6 Click Score: 18    End of Session Equipment Utilized During Treatment: Gait  belt Activity Tolerance: Patient tolerated treatment well Patient left: in chair;with call bell/phone within reach;with chair alarm set   PT Visit Diagnosis: Unsteadiness on feet (R26.81);Muscle weakness (generalized) (M62.81);Apraxia (R48.2);Other symptoms and signs involving the nervous system [id number])     Time: [id number] PT Time Calculation (min) (ACUTE ONLY): 13 min  Charges:  $Gait Training: 8-22 mins                     Shamaria Kavan B. Suede [place], PT, DPT  Acute Rehabilitation [phone] pager [phone]) [phone] office  @ [facility]: [phone]

## 2018-09-18 NOTE — Progress Notes (Signed)
OT Cancellation Note  Patient Details Name: [name] MRN: [id number] DOB: [birth year]   Cancelled Treatment:    Reason Eval/Treat Not Completed: Patient at procedure or test/ unavailable(MD in with pt and family member).  Will continue to follow per POC.  Simonne Come 09/18/2018, 2:47 PM

## 2018-09-18 NOTE — Progress Notes (Signed)
  Speech Language Pathology Treatment: Cognitive-Linquistic(Aphasia)  Patient Details Name: [name] MRN: [id number] DOB: Mar 13, 1960 Today's Date: 09/18/2018 Time: [id number] SLP Time Calculation (min) (ACUTE ONLY): 23 min  Assessment / Plan / Recommendation Clinical Impression  Pt was seen for aphasia treatment and was cooperative throughout the session. However, his processing speed was notably increased compared to that which was demonstrated yesterday. Upon inquiry about how he slept last night, he stated, "I stayed up all night" and agreed that he was tired. The impact of this on his performance is considered. Pt demonstrated 80% accuracy with confrontational naming. He achieved 40% accuracy with phrase completion increasing to 80% with phonemic cues. He was better able to respond to some open-ended questions which required single-word responses. Max support was needed during structured sentence formulation tasks. SLP will continue to follow pt.     HPI HPI: Pt is a 58 y.o. male with past medical history significant for hypertension, hyperlipidemia, diabetes mellitus, recent admission in January 2020 for acute CVA. Pt presented to the ED s/p fall and denied head trauma, or any pain or injuries but patient presented as a "very poor historian" at that time. MRI was negative for acute changes but showed multiple scattered chronic lacunar infarcts involving the right caudate, bilateral thalami, pons, and bilateral cerebellar hemispheres. EEG showed cortical dysfunction in left hemisphere, maximal left frontotemporal region as well as mild diffuse encephalopathy      SLP Plan  Continue with current plan of care       Recommendations                   Follow up Recommendations: Skilled Nursing facility SLP Visit Diagnosis: Aphasia (R47.01) Plan: Continue with current plan of care       Raquel Sayres I. Hardin Negus, [place], [place] Office number  [phone] Pager [phone]                Horton Marshall 09/18/2018, 11:58 AM

## 2018-09-19 LAB — GLUCOSE, CAPILLARY
Glucose-Capillary: 114 mg/dL — ABNORMAL HIGH (ref 70–99)
Glucose-Capillary: 107 mg/dL — ABNORMAL HIGH (ref 70–99)
Glucose-Capillary: 216 mg/dL — ABNORMAL HIGH (ref 70–99)

## 2018-09-19 MED ORDER — TAMSULOSIN HCL 0.4 MG PO CAPS: 0.4000 mg | ORAL_CAPSULE | Freq: Every day | ORAL | 0 refills | Status: AC

## 2018-09-19 MED ORDER — LEVETIRACETAM 500 MG PO TABS: 500.0000 mg | ORAL_TABLET | Freq: Two times a day (BID) | ORAL | 0 refills | Status: DC

## 2018-09-19 MED ORDER — LEVETIRACETAM 500 MG PO TABS: 500.0000 mg | ORAL_TABLET | Freq: Two times a day (BID) | ORAL | Status: DC

## 2018-09-19 NOTE — Progress Notes (Signed)
  Speech Language Pathology Treatment: Cognitive-Linquistic(Aphasia )  Patient Details Name: [name] MRN: [id number] DOB: 1960-08-31 Today's Date: 09/19/2018 Time: 1236-1300 SLP Time Calculation (min) (ACUTE ONLY): 24 min  Assessment / Plan / Recommendation Clinical Impression  [name] was seen for aphasia treatment and was cooperation throughout the session. His alertness and participation were both improved compared to yesterday. His language skills are notably improved compared to that which was noted during the initial evaluation. He demonstrated 80% accuracy with responsive naming increasing to 100% with min cues. He achieved 80% accuracy with structured sentence formulation and was able to formulate simple sentences during conversation with additional processing time. With mod cues he provided 5 items per concrete category during divergent naming tasks. He was oriented to person, place, and situation but required min-mod cues for orientation to time. Mod cues were needed for functional problem solving during the session related to his wanting and attempting to independently go to the bathroom despite his self-reported lower extremity weakness having a catheter. SLP will continue to follow [name].    HPI HPI: [name] is a 58 y.o. male with past medical history significant for hypertension, hyperlipidemia, diabetes mellitus, recent admission in January 2020 for acute CVA. [name] presented to the ED s/p fall and denied head trauma, or any pain or injuries but patient presented as a "very poor historian" at that time. MRI was negative for acute changes but showed multiple scattered chronic lacunar infarcts involving the right caudate, bilateral thalami, pons, and bilateral cerebellar hemispheres. EEG showed cortical dysfunction in left hemisphere, maximal left frontotemporal region as well as mild diffuse encephalopathy      SLP Plan  Continue with current plan of care       Recommendations                    Follow up Recommendations: Home health SLP;24 hour supervision/assistance SLP Visit Diagnosis: Aphasia (R47.01) Plan: Continue with current plan of care       Senta Kantor I. Hardin Negus, [place], [place] Office number [phone] Pager [place] 09/19/2018, 1:01 PM

## 2018-09-19 NOTE — TOC Transition Note (Signed)
Transition of Care [facility]) - CM/SW Discharge Note   Patient Details  Name: [name] MRN: [id number] Date of Birth: 1960-08-28  Transition of Care [facility]) CM/SW Contact:  Sharin Mons, RN Phone Number: 09/19/2018, 3:08 PM   Clinical Narrative:  Admitted with AMS.Hx of hypertension, diabetes mellitus, hyperlipidemia, acute stroke in January 2020. Resides with brother [place]zz[place].  Pt will transition to home with today. Lives with brother, Haake.  Sister [name] @ bedside states family to ensure pt has 24/7 supervision/ assist.  NCM made referral for [facility] care with [facility] for home health services ( RN,PT,OT). Dorian Pod stated [facility] unable to accept pt however will search other agencies for acceptance. NCM will f/u with pt/ sister once aware of acceptance from agency...Marland KitchenMarland Kitchen  Pt states already has DME ( cane , walker).  Adoniram Cicotte ([phone]     PCP: Glendon Axe   Sister to provide transportation to home.  Final next level of care: Home w Home Health Services Barriers to Discharge: No Barriers Identified   Patient Goals and CMS Choice        Discharge Placement                       Discharge Plan and Services                          Home health services arranged: RN,PT,OT .Marland KitchenMarland Kitchen PENDING Charity approval   Date [place]: 09/19/18 Time [place]: 1506 Representative spoke with at [place]: Dorian Pod  Social Determinants of Health ([place]) Interventions     Readmission Risk Interventions No flowsheet data found.

## 2018-09-19 NOTE — Progress Notes (Signed)
Occupational Therapy Treatment Patient Details Name: [name] MRN: [id number] DOB: 03/24/1960 Today's Date: 09/19/2018    History of present illness 58 y.o. male with past medical history significant for hypertension, diabetes mellitus, hyperlipidemia, acute stroke in January 2020 presents to the emergency department with altered mental status and right-sided weakness and leaning to the right side.  MRI negative for stroke.  EEG pending for possible post-ictal state weakness.    OT comments  Pt continues to progress towards occupational therapy goals. Pt needing min A for hand held ambulating in room to bathroom for toileting needs. Min cuing and increased time for processing and safety awareness. Pt impulsive and attempting to stand without assistance multiple times this session. Pt returning to sit in recliner chair at end of session with chair alarm activated and all needs within reach. Pt continues to benefit from OT intervention  Follow Up Recommendations  HHOT ;Supervision/Assistance - 24 hour    Equipment Recommendations  (defer to next venue of care)       Precautions / Restrictions Precautions Precautions: Fall Precaution Comments: mildly unsteady on his feet Restrictions Weight Bearing Restrictions: No       Mobility Bed Mobility Overal bed mobility: Needs Assistance Bed Mobility: Supine to Sit Rolling: Supervision   Supine to sit: Supervision     General bed mobility comments: min cuing for hand placement and technique  Transfers Overall transfer level: Needs assistance Equipment used: 1 person hand held assist Transfers: Sit to/from Stand Sit to Stand: Min guard Stand pivot transfers: Min assist       General transfer comment: min guard/assist for safety and balance    Balance Overall balance assessment: Needs assistance Sitting-balance support: No upper extremity supported;Feet supported Sitting balance-Leahy Scale: Good Sitting balance -  Comments: min assist to close supervision.     Standing balance support: Single extremity supported Standing balance-Leahy Scale: Fair Standing balance comment: needs external support in standing (light).        ADL either performed or assessed with clinical judgement   ADL Overall ADL's : Needs assistance/impaired     Grooming: Standing;Minimal assistance Grooming Details (indicate cue type and reason): min A standing at sink without UE support     Toilet Transfer: Minimal assistance;Grab bars   Toileting- Clothing Manipulation and Hygiene: Minimal assistance;Sit to/from stand         General ADL Comments: min A overall for balance without use of AD. Pt needing min cuing and increased time to problem solve     Vision   Vision Assessment?: Vision impaired- to be further tested in functional context          Cognition Arousal/Alertness: Awake/alert Behavior During Therapy: Flat affect Overall Cognitive Status: Impaired/Different from baseline Area of Impairment: Problem solving;Attention;Safety/judgement;Awareness                 Orientation Level: Situation Current Attention Level: Selective Memory: Decreased short-term memory Following Commands: Follows one step commands with increased time Safety/Judgement: Decreased awareness of safety;Decreased awareness of deficits Awareness: Emergent Problem Solving: Slow processing                     Pertinent Vitals/ Pain       Pain Assessment: Faces Faces Pain Scale: No hurt         Frequency  Min 2X/week        Progress Toward Goals  OT Goals(current goals can now be found in the care plan section)  Progress towards OT  goals: Progressing toward goals  Acute Rehab OT Goals Patient Stated Goal: to go home Time For Goal Achievement: 10/03/18 Potential to Achieve Goals: Good  Plan Discharge plan remains appropriate       AM-PAC OT "6 Clicks" Daily Activity     Outcome Measure   Help from  another person eating meals?: A Little Help from another person taking care of personal grooming?: A Little Help from another person toileting, which includes using toliet, bedpan, or urinal?: A Little Help from another person bathing (including washing, rinsing, drying)?: A Little Help from another person to put on and taking off regular upper body clothing?: A Little Help from another person to put on and taking off regular lower body clothing?: A Lot 6 Click Score: 17    End of Session    OT Visit Diagnosis: Other abnormalities of gait and mobility (R26.89);Other symptoms and signs involving the nervous system (R29.898)   Activity Tolerance Patient tolerated treatment well   Patient Left in chair;with call bell/phone within reach;with chair alarm set   Nurse Communication Mobility status;Precautions        Time: [id number] OT Time Calculation (min): 20 min  Charges: OT General Charges $OT Visit: 1 Visit OT Treatments $Self Care/Home Management : 8-22 mins   Gypsy Decant, Ms, OTR/L 09/19/2018, 12:03 PM

## 2018-09-19 NOTE — Discharge Summary (Addendum)
Physician Discharge Summary  [name] [id number] DOB: December 07, 1960 DOA: 09/14/2018  PCP: Glendon Axe, MD  Admit date: 09/14/2018 Discharge date: 09/19/2018  Admitted From: Home  Disposition:  Home   Recommendations for Outpatient Follow-up and new medication changes:  1. Follow up with Dr. Candiss Norse in 7 days.  2. Patient has been placed on Keppra 500 mg po bid. 3. Continue dual antiplatelet therapy with asa and clopidogrel.   Home Health: Yes   Equipment/Devices: walker    Discharge Condition: stable  CODE STATUS: full  Diet recommendation: heart healthy and diabetic prudent.   Brief/Interim Summary: 58 year old male who presented with focal neurologic deficit.  Patient does have significant past medical history for hypertension, dyslipidemia, type II days mellitus and CVA.  24 hours prior to admission patient was found leaning toward his left side, associated with unsteady gait and falls.  On his initial physical examination blood pressure 179/112, heart rate 77, temperature 98.1, respiratory rate 23, oxygen saturation 100%.  He was awake and alert, mildly confused.  Slurred speech, right upper extremity strength 4 out of 5.  His lungs were clear to auscultation bilaterally, heart S1-S2 present and rhythm, his abdomen was soft nontender, L extremity edema.   Sodium 132, potassium 3.4, chloride 98, bicarb 20, glucose 183, BUN 8, creatinine 0.80, white count 6.7, hemoglobin 13.4, hematocrit 40.4, platelets 157.  SARS COVID-19 was negative.  Urine analysis had 0-5 white cells, specific gravity more than 1.046.  Toxicology screen negative, alcohol level less than 10. Head CT with no acute changes.  CT angiography with ischemia in the anterior left frontal lobe.  Occluded right vertebral artery.  71 bpm, normal axis, normal intervals, sinus rhythm, J-point elevation in V1 and V2, no T wave changes.  Chest radiograph, hypoinflated, left rotation, cardiomegaly.  No infiltrates.  Patient  was admitted to the hospital with working diagnosis of acute focal neurologic deficit, rule out CVA.  Further work-up revealed negative ischemia per brain MRI.  Patient hospitalization was complicated by witnessed seizure x2, controlled with Keppra.  1.  Acute metabolic encephalopathy, complicated by seizures.  Patient was admitted to the medical ward, he was placed on a remote telemetry monitor.  He had 2 witnessed seizures.  Patient had further work-up with electroencephalography, showing cortical dysfunction in the left hemisphere, no active seizures.  Patient was loaded with Keppra with good toleration.  Currently on 500 mg of Keppra twice daily.  Patient will follow-up as an outpatient.  Continue seizure precautions.  2.  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  Patient underwent brain MRI showed no acute infarct, patient will continue statin therapy along with dual antiplatelet therapy.  Physical therapy recommended continue home therapy per home health. (Ruled out acute CVA).  3.  Hypertension.  Continue blood pressure control with amlodipine.  4.  Type 2 diabetes mellitus.  Patient was placed on insulin sliding scale while hospitalized. At discharge will continue metformin at discharge.   5.  Tobacco abuse.  Continue smoking cessation.      Discharge Diagnoses:  Active Problems:   Diabetes mellitus type II, non insulin dependent (HCC)   Stroke (HCC)   HTN (hypertension)   TIA (transient ischemic attack)   Right sided weakness   Seizure disorder [facility])    Discharge Instructions   Allergies as of 09/19/2018   No Known Allergies     Medication List    TAKE these medications   amLODipine 2.5 MG tablet Commonly  known as: NORVASC Take 2.5 mg by mouth daily.   aspirin 81 MG EC tablet Take 1 tablet (81 mg total) by mouth daily.   atorvastatin 40 MG tablet Commonly known as: LIPITOR Take 1 tablet (40 mg total) by  mouth daily at 6 PM.   clopidogrel 75 MG tablet Commonly known as: PLAVIX Take 1 tablet (75 mg total) by mouth daily with breakfast.   levETIRAcetam 500 MG tablet Commonly known as: KEPPRA Take 1 tablet (500 mg total) by mouth 2 (two) times daily.   metFORMIN 1000 MG tablet Commonly known as: GLUCOPHAGE Take 1,000 mg by mouth 2 (two) times daily with a meal.   tamsulosin 0.4 MG Caps capsule Commonly known as: FLOMAX Take 1 capsule (0.4 mg total) by mouth daily after supper.       No Known Allergies  Consultations:  Neurology    Procedures/Studies: Ct Angio Head W Or Wo Contrast  Result Date: 09/14/2018 CLINICAL DATA:  Focal neuro deficit, > 6 hrs, stroke suspected R sided weakness; Focal neuro deficit, < 6 hrs, stroke suspected EXAM: CT ANGIOGRAPHY HEAD AND NECK CT PERFUSION BRAIN TECHNIQUE: Multidetector CT imaging of the head and neck was performed using the standard protocol during bolus administration of intravenous contrast. Multiplanar CT image reconstructions and MIPs were obtained to evaluate the vascular anatomy. Carotid stenosis measurements (when applicable) are obtained utilizing NASCET criteria, using the distal internal carotid diameter as the denominator. Multiphase CT imaging of the brain was performed following IV bolus contrast injection. Subsequent parametric perfusion maps were calculated using RAPID software. CONTRAST:  143mL OMNIPAQUE IOHEXOL 350 MG/ML SOLN COMPARISON:  CT head fifth without contrast 09/14/2018. MRI of the brain 01/27/2018. FINDINGS: CTA NECK FINDINGS Aortic arch: A 3 vessel arch configuration is present. Minimal calcifications are present at the origin of the innominate artery and along the undersurface of the arch. There is no significant stenosis or aneurysm. Right carotid system: The right common carotid artery is within normal limits. Bifurcation is normal. There is some tortuosity of the cervical right ICA without significant stenosis. Left  carotid system: The left common carotid artery is tortuous proximally. The bifurcation is unremarkable. There is moderate tortuosity of the cervical left ICA without significant stenosis. Vertebral arteries: The left vertebral artery is hypoplastic. The right vertebral artery is occluded. There is no significant reconstitution of the right vertebral artery in the neck. Skeleton: There straightening of the normal cervical lordosis. No significant listhesis is present. Vertebral body heights are maintained. No focal lytic or blastic lesions are present. Multiple dental caries and periapical lucencies are present within residual mandibular teeth. Other neck: No focal mucosal or submucosal lesions are present. The submandibular and parotid glands and ducts are normal. Thyroid is unremarkable. No significant adenopathy is present. Upper chest: The lung apices are clear. Paraseptal emphysematous changes are noted. Thoracic inlet is normal. Review of the MIP images confirms the above findings CTA HEAD FINDINGS Anterior circulation: Atherosclerotic calcifications are present within the cavernous internal carotid arteries bilaterally. There is tortuosity and moderate stenosis involving the cavernous right internal carotid artery. The left internal carotid artery is within normal limits through the ICA terminus. The A1 and M1 segments are normal. The anterior communicating artery is patent. Right A1 segment is dominant. MCA bifurcations are within normal limits. There is mild distal branch vessel narrowing involving ACA and MCA branch vessels. There is slight increased vascularity on the left, suggesting the possibility of luxury perfusion. Posterior circulation: The right vertebral artery is  reconstituted at the V4 segment. PICA is opacified. A distal V4 stenosis is present. The left vertebral artery is hypoplastic but patent through the vertebrobasilar junction. The basilar artery is small. Posterior communicating arteries  are patent bilaterally. The PCA branch vessels are unremarkable. Small P1 segments are noted bilaterally. Venous sinuses: Dural sinuses are patent. The straight sinus and deep cerebral veins are intact. Cortical veins are unremarkable. Anatomic variants: Prominent posterior communicating arteries bilaterally. Review of the MIP images confirms the above findings CT Brain Perfusion Findings: ASPECTS: 10/10 CBF (<30%) Volume: 30mL Perfusion (Tmax>6.0s) volume: 41mL Mismatch Volume: 79mL Infarction Location:Anterior left frontal lobe. IMPRESSION: 1. Area of ischemia in the anterior left frontal lobe. There is some bilateral increased T-max which is likely artifactual. 2. No significant core infarct. 3. Question luxury perfusion over the left hemisphere consistent with recent ischemia. 4. 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 5. The left vertebral artery is patent. There is flow in the distal right V4 segment to the posterior communicating artery. 6. 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 7. Prominent posterior communicating arteries bilaterally. 8. No other significant proximal large vessel occlusion. 9. No significant carotid stenosis in the neck. There is mild tortuosity. 10. Minimal atherosclerotic changes at the aortic arch without significant stenosis. These results were called by telephone at the time of interpretation on 09/14/2018 at 1:49 pm to Dr. Samara Snide , who verbally acknowledged these results. Electronically Signed   By: San Morelle M.D.   On: 09/14/2018 13:50   Ct Head Wo Contrast  Result Date: 09/14/2018 CLINICAL DATA:  Patient with right-sided weakness. EXAM: CT HEAD WITHOUT CONTRAST TECHNIQUE: Contiguous axial images were obtained from the base of the skull through the vertex without intravenous contrast. COMPARISON:  Brain CT 01/26/2018 FINDINGS: Brain: Ventricles are prominent compatible with atrophy. No evidence for acute  cortically based infarct, intracranial hemorrhage, mass lesion or mass-effect. Old left thalamic lacunar infarct. Vascular: Unremarkable Skull: Intact Sinuses/Orbits: Paranasal sinuses are well aerated. Mastoid air cells are unremarkable. Orbits are unremarkable. Other: None. IMPRESSION: No acute intracranial process. Atrophy and chronic microvascular ischemic changes. Electronically Signed   By: Lovey Newcomer M.D.   On: 09/14/2018 12:08   Ct Angio Neck W And/or Wo Contrast  Result Date: 09/14/2018 CLINICAL DATA:  Focal neuro deficit, > 6 hrs, stroke suspected R sided weakness; Focal neuro deficit, < 6 hrs, stroke suspected EXAM: CT ANGIOGRAPHY HEAD AND NECK CT PERFUSION BRAIN TECHNIQUE: Multidetector CT imaging of the head and neck was performed using the standard protocol during bolus administration of intravenous contrast. Multiplanar CT image reconstructions and MIPs were obtained to evaluate the vascular anatomy. Carotid stenosis measurements (when applicable) are obtained utilizing NASCET criteria, using the distal internal carotid diameter as the denominator. Multiphase CT imaging of the brain was performed following IV bolus contrast injection. Subsequent parametric perfusion maps were calculated using RAPID software. CONTRAST:  130mL OMNIPAQUE IOHEXOL 350 MG/ML SOLN COMPARISON:  CT head fifth without contrast 09/14/2018. MRI of the brain 01/27/2018. FINDINGS: CTA NECK FINDINGS Aortic arch: A 3 vessel arch configuration is present. Minimal calcifications are present at the origin of the innominate artery and along the undersurface of the arch. There is no significant stenosis or aneurysm. Right carotid system: The right common carotid artery is within normal limits. Bifurcation is normal. There is some tortuosity of the cervical right ICA without significant stenosis. Left carotid system: The left common carotid artery is tortuous proximally. The bifurcation is  unremarkable. There is moderate tortuosity of  the cervical left ICA without significant stenosis. Vertebral arteries: The left vertebral artery is hypoplastic. The right vertebral artery is occluded. There is no significant reconstitution of the right vertebral artery in the neck. Skeleton: There straightening of the normal cervical lordosis. No significant listhesis is present. Vertebral body heights are maintained. No focal lytic or blastic lesions are present. Multiple dental caries and periapical lucencies are present within residual mandibular teeth. Other neck: No focal mucosal or submucosal lesions are present. The submandibular and parotid glands and ducts are normal. Thyroid is unremarkable. No significant adenopathy is present. Upper chest: The lung apices are clear. Paraseptal emphysematous changes are noted. Thoracic inlet is normal. Review of the MIP images confirms the above findings CTA HEAD FINDINGS Anterior circulation: Atherosclerotic calcifications are present within the cavernous internal carotid arteries bilaterally. There is tortuosity and moderate stenosis involving the cavernous right internal carotid artery. The left internal carotid artery is within normal limits through the ICA terminus. The A1 and M1 segments are normal. The anterior communicating artery is patent. Right A1 segment is dominant. MCA bifurcations are within normal limits. There is mild distal branch vessel narrowing involving ACA and MCA branch vessels. There is slight increased vascularity on the left, suggesting the possibility of luxury perfusion. Posterior circulation: The right vertebral artery is reconstituted at the V4 segment. PICA is opacified. A distal V4 stenosis is present. The left vertebral artery is hypoplastic but patent through the vertebrobasilar junction. The basilar artery is small. Posterior communicating arteries are patent bilaterally. The PCA branch vessels are unremarkable. Small P1 segments are noted bilaterally. Venous sinuses: Dural sinuses  are patent. The straight sinus and deep cerebral veins are intact. Cortical veins are unremarkable. Anatomic variants: Prominent posterior communicating arteries bilaterally. Review of the MIP images confirms the above findings CT Brain Perfusion Findings: ASPECTS: 10/10 CBF (<30%) Volume: 52mL Perfusion (Tmax>6.0s) volume: 65mL Mismatch Volume: 45mL Infarction Location:Anterior left frontal lobe. IMPRESSION: 1. Area of ischemia in the anterior left frontal lobe. There is some bilateral increased T-max which is likely artifactual. 2. No significant core infarct. 3. Question luxury perfusion over the left hemisphere consistent with recent ischemia. 4. 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 5. The left vertebral artery is patent. There is flow in the distal right V4 segment to the posterior communicating artery. 6. 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 7. Prominent posterior communicating arteries bilaterally. 8. No other significant proximal large vessel occlusion. 9. No significant carotid stenosis in the neck. There is mild tortuosity. 10. Minimal atherosclerotic changes at the aortic arch without significant stenosis. These results were called by telephone at the time of interpretation on 09/14/2018 at 1:49 pm to Dr. Samara Snide , who verbally acknowledged these results. Electronically Signed   By: San Morelle M.D.   On: 09/14/2018 13:50   Mr Brain Wo Contrast  Result Date: 09/18/2018 CLINICAL DATA:  Stroke follow-up.  Dizziness and gait imbalance. EXAM: MRI HEAD WITHOUT CONTRAST TECHNIQUE: Multiplanar, multiecho pulse sequences of the brain and surrounding structures were obtained without intravenous contrast. COMPARISON:  09/15/2018 FINDINGS: A limited examination was performed at the request of the ordering neurologist's consisting of only axial and coronal diffusion imaging. No acute infarct is identified. A subcentimeter focus of mild  trace diffusion hyperintensity in the posterior right frontal lobe on axial imaging (series 3, image 41) is not confirmed on coronal imaging and was also present on the prior  study which showed some associated T2/FLAIR hyperintensity, and this is favored to reflect an old infarct. Chronic changes including multiple old infarcts elsewhere and cerebral atrophy were more fully evaluated on the recent prior complete brain MRI. IMPRESSION: Diffusion only brain MRI without an acute infarct identified. Electronically Signed   By: Logan Bores M.D.   On: 09/18/2018 13:53   Mr Brain Wo Contrast  Result Date: 09/15/2018 CLINICAL DATA:  Initial evaluation for right-sided weakness, stroke suspected. EXAM: MRI HEAD WITHOUT CONTRAST TECHNIQUE: Multiplanar, multiecho pulse sequences of the brain and surrounding structures were obtained without intravenous contrast. COMPARISON:  Prior CT and CTA from 09/14/2018 as well as previous brain MRI from 01/27/2018. FINDINGS: Brain: Diffuse prominence of the CSF containing spaces compatible with generalized age-related cerebral atrophy. Patchy and confluent T2/FLAIR hyperintensity within the periventricular deep white matter both cerebral hemispheres most consistent with chronic small vessel ischemic disease, mild in nature. Superimposed remote lacunar infarcts seen involving the bilateral thalami, right caudate head, right paramedian pons, and bilateral cerebellar hemispheres. No abnormal foci of restricted diffusion to suggest acute or subacute ischemia. Specifically, no diffusion abnormality seen to correspond with previously noted perfusion abnormality on prior CT for fusion. Gray-white matter differentiation maintained. No evidence for acute or chronic intracranial hemorrhage. No mass lesion, midline shift or mass effect. Mild diffuse ventricular prominence related global parenchymal volume loss, stable from previous. No hydrocephalus. No extra-axial fluid collection. Pituitary  gland suprasellar region normal. Vascular: Apparent loss of normal flow void within the left V4 segment (series 10, image 3), likely related to slow flow, as the left V4 segment appears patent on prior CTA. Visualized major intracranial vascular flow voids otherwise maintained. Skull and upper cervical spine: Craniocervical junction within normal limits. Upper cervical spine normal. Visualized bone marrow signal intensity diffusely decreased on T1 weighted imaging, nonspecific, but most commonly related to anemia, smoking, or obesity. No focal marrow replacing lesion. Scalp soft tissues unremarkable. Sinuses/Orbits: Globes and orbital soft tissues demonstrate no acute finding. Remote posttraumatic defect noted at the right lamina for pre shift. Mild scattered mucosal thickening throughout the paranasal sinuses. No air-fluid level to suggest acute sinusitis. No mastoid effusion. Inner ear structures grossly normal. Other: None. IMPRESSION: 1. No acute intracranial infarct or other abnormality identified. 2. Multiple scattered chronic lacunar infarcts involving the right caudate, bilateral thalami, pons, and bilateral cerebellar hemispheres. 3. Moderately advanced cerebral atrophy with mild chronic microvascular ischemic disease. Electronically Signed   By: Jeannine Boga M.D.   On: 09/15/2018 03:11   Ct Cerebral Perfusion W Contrast  Result Date: 09/14/2018 CLINICAL DATA:  Focal neuro deficit, > 6 hrs, stroke suspected R sided weakness; Focal neuro deficit, < 6 hrs, stroke suspected EXAM: CT ANGIOGRAPHY HEAD AND NECK CT PERFUSION BRAIN TECHNIQUE: Multidetector CT imaging of the head and neck was performed using the standard protocol during bolus administration of intravenous contrast. Multiplanar CT image reconstructions and MIPs were obtained to evaluate the vascular anatomy. Carotid stenosis measurements (when applicable) are obtained utilizing NASCET criteria, using the distal internal carotid diameter  as the denominator. Multiphase CT imaging of the brain was performed following IV bolus contrast injection. Subsequent parametric perfusion maps were calculated using RAPID software. CONTRAST:  152mL OMNIPAQUE IOHEXOL 350 MG/ML SOLN COMPARISON:  CT head fifth without contrast 09/14/2018. MRI of the brain 01/27/2018. FINDINGS: CTA NECK FINDINGS Aortic arch: A 3 vessel arch configuration is present. Minimal calcifications are present at the origin of the innominate artery and along the undersurface of the  arch. There is no significant stenosis or aneurysm. Right carotid system: The right common carotid artery is within normal limits. Bifurcation is normal. There is some tortuosity of the cervical right ICA without significant stenosis. Left carotid system: The left common carotid artery is tortuous proximally. The bifurcation is unremarkable. There is moderate tortuosity of the cervical left ICA without significant stenosis. Vertebral arteries: The left vertebral artery is hypoplastic. The right vertebral artery is occluded. There is no significant reconstitution of the right vertebral artery in the neck. Skeleton: There straightening of the normal cervical lordosis. No significant listhesis is present. Vertebral body heights are maintained. No focal lytic or blastic lesions are present. Multiple dental caries and periapical lucencies are present within residual mandibular teeth. Other neck: No focal mucosal or submucosal lesions are present. The submandibular and parotid glands and ducts are normal. Thyroid is unremarkable. No significant adenopathy is present. Upper chest: The lung apices are clear. Paraseptal emphysematous changes are noted. Thoracic inlet is normal. Review of the MIP images confirms the above findings CTA HEAD FINDINGS Anterior circulation: Atherosclerotic calcifications are present within the cavernous internal carotid arteries bilaterally. There is tortuosity and moderate stenosis involving the  cavernous right internal carotid artery. The left internal carotid artery is within normal limits through the ICA terminus. The A1 and M1 segments are normal. The anterior communicating artery is patent. Right A1 segment is dominant. MCA bifurcations are within normal limits. There is mild distal branch vessel narrowing involving ACA and MCA branch vessels. There is slight increased vascularity on the left, suggesting the possibility of luxury perfusion. Posterior circulation: The right vertebral artery is reconstituted at the V4 segment. PICA is opacified. A distal V4 stenosis is present. The left vertebral artery is hypoplastic but patent through the vertebrobasilar junction. The basilar artery is small. Posterior communicating arteries are patent bilaterally. The PCA branch vessels are unremarkable. Small P1 segments are noted bilaterally. Venous sinuses: Dural sinuses are patent. The straight sinus and deep cerebral veins are intact. Cortical veins are unremarkable. Anatomic variants: Prominent posterior communicating arteries bilaterally. Review of the MIP images confirms the above findings CT Brain Perfusion Findings: ASPECTS: 10/10 CBF (<30%) Volume: 30mL Perfusion (Tmax>6.0s) volume: 47mL Mismatch Volume: 47mL Infarction Location:Anterior left frontal lobe. IMPRESSION: 1. Area of ischemia in the anterior left frontal lobe. There is some bilateral increased T-max which is likely artifactual. 2. No significant core infarct. 3. Question luxury perfusion over the left hemisphere consistent with recent ischemia. 4. 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 5. The left vertebral artery is patent. There is flow in the distal right V4 segment to the posterior communicating artery. 6. 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 7. Prominent posterior communicating arteries bilaterally. 8. No other significant proximal large vessel occlusion. 9. No significant carotid  stenosis in the neck. There is mild tortuosity. 10. Minimal atherosclerotic changes at the aortic arch without significant stenosis. These results were called by telephone at the time of interpretation on 09/14/2018 at 1:49 pm to Dr. Samara Snide , who verbally acknowledged these results. Electronically Signed   By: San Morelle M.D.   On: 09/14/2018 13:50   Dg Chest Port 1 View  Result Date: 09/15/2018 CLINICAL DATA:  Aspiration into airway EXAM: PORTABLE CHEST 1 VIEW COMPARISON:  None. FINDINGS: Heart is borderline in size. No confluent airspace opacities, effusions or edema. Low lung volumes. No acute bony abnormality. IMPRESSION: Low volumes.  No active disease. Electronically Signed  By: Rolm Baptise M.D.   On: 09/15/2018 20:27      Procedures:   Subjective: Patient is feeling back to his baseline, no chest pain, no dyspnea, no nausea or vomiting. No further seizure.   Discharge Exam: Vitals:   09/19/18 0411 09/19/18 0730  BP: 135/73 130/64  Pulse: 65 (!) 57  Resp: 20 20  Temp: 98.6 F (37 C) 98.1 F (36.7 C)  SpO2: 100% 99%   Vitals:   09/18/18 2009 09/18/18 2331 09/19/18 0411 09/19/18 0730  BP: (!) 155/88 (!) 143/70 135/73 130/64  Pulse: 70 63 65 (!) 57  Resp: 19 20 20 20   Temp: 99.1 F (37.3 C) 99 F (37.2 C) 98.6 F (37 C) 98.1 F (36.7 C)  TempSrc: Oral Oral Oral Oral  SpO2: 100% 98% 100% 99%  Weight:      Height:        General: Not in pain or dyspnea.  Neurology: Awake and alert.   E ENT: no pallor, no icterus, oral mucosa moist Cardiovascular: No JVD. S1-S2 present, rhythmic, no gallops, rubs, or murmurs. No lower extremity edema. Pulmonary: vesicular breath sounds bilaterally, adequate air movement, no wheezing, rhonchi or rales. Gastrointestinal. Abdomen with no organomegaly, non tender, no rebound or guarding Skin. No rashes Musculoskeletal: no joint deformities   The results of significant diagnostics from this hospitalization (including  imaging, microbiology, ancillary and laboratory) are listed below for reference.     Microbiology: Recent Results (from the past 240 hour(s))  Novel Coronavirus, NAA (send-out to ref lab)     Status: None   Collection Time: 09/14/18  1:44 PM   Specimen: Nasopharyngeal Swab; Respiratory  Result Value Ref Range Status   SARS-CoV-2, NAA NOT DETECTED NOT DETECTED Final    Comment: (NOTE) This test was developed and its performance characteristics determined by [employer]. This test has not been FDA cleared or approved. This test has been authorized by FDA under an Emergency Use Authorization (EUA). This test is only authorized for the duration of time the declaration that circumstances exist justifying the authorization of the emergency use of in vitro diagnostic tests for detection of SARS-CoV-2 virus and/or diagnosis of COVID-19 infection under section 564(b)(1) of the Act, 21 U.S.C. [id number]), unless the authorization is terminated or revoked sooner. When diagnostic testing is negative, the possibility of a false negative result should be considered in the context of a patient's recent exposures and the presence of clinical signs and symptoms consistent with COVID-19. An individual without symptoms of COVID-19 and who is not shedding SARS-CoV-2 virus would expect to have a negative (not detected) result in this assay. Performed  At: [facility] [address] [place], [place] [id number] Rush Farmer MD [id number]    [place]  Final    Comment: Performed at [facility], [place] [address], [place], [place] [address]     Labs: BNP (last 3 results) No results for input(s): BNP in the last 8760 hours. Basic Metabolic Panel: Recent Labs  Lab 09/14/18 1052 09/14/18 1100 09/15/18 0639 09/16/18 0830 09/17/18 0400  NA 132* 134* 132* 134* 133*  K 3.4* 3.3* 3.2* 3.2* 3.5  CL 98 97* 98 102 106  CO2 20*  --  24 20* 21*   GLUCOSE 183* 185* 103* 86 113*  BUN 8 10 8 11 12   CREATININE 0.80 0.70 0.87 0.80 0.84  CALCIUM 8.9  --  8.6* 8.5* 8.3*  MG  --   --   --  1.8  --    Liver Function Tests: Recent Labs  Lab 09/14/18 1052  AST 23  ALT 26  ALKPHOS 76  BILITOT 1.2  PROT 7.7  ALBUMIN 4.0   No results for input(s): LIPASE, AMYLASE in the last 168 hours. Recent Labs  Lab 09/14/18 1057  AMMONIA 10   CBC: Recent Labs  Lab 09/14/18 1052 09/14/18 1100 09/15/18 0639 09/16/18 0830  WBC 6.7  --  6.0 5.6  NEUTROABS 5.3  --   --  3.4  HGB 13.4 14.3 12.7* 12.9*  HCT 40.4 42.0 38.1* 38.2*  MCV 89.2  --  88.4 88.4  PLT 157  --  145* 147*   Cardiac Enzymes: No results for input(s): CKTOTAL, CKMB, CKMBINDEX, TROPONINI in the last 168 hours. BNP: Invalid input(s): POCBNP CBG: Recent Labs  Lab 09/18/18 1719 09/18/18 2008 09/18/18 2356 09/19/18 0420 09/19/18 0727  GLUCAP 155* 175* 142* 114* 107*   D-Dimer No results for input(s): DDIMER in the last 72 hours. Hgb A1c No results for input(s): HGBA1C in the last 72 hours. Lipid Profile No results for input(s): CHOL, HDL, LDLCALC, TRIG, CHOLHDL, LDLDIRECT in the last 72 hours. Thyroid function studies No results for input(s): TSH, T4TOTAL, T3FREE, THYROIDAB in the last 72 hours.  Invalid input(s): FREET3 Anemia work up No results for input(s): VITAMINB12, FOLATE, FERRITIN, TIBC, IRON, RETICCTPCT in the last 72 hours. Urinalysis    Component Value Date/Time   COLORURINE YELLOW 09/14/2018 [place] 09/14/2018 1715   APPEARANCEUR Clear 12/29/2013 1214   LABSPEC >1.046 (H) 09/14/2018 1715   LABSPEC 1.017 12/29/2013 1214   PHURINE 6.0 09/14/2018 1715   GLUCOSEU 50 (A) 09/14/2018 1715   GLUCOSEU >=500 12/29/2013 1214   HGBUR NEGATIVE 09/14/2018 [place] 09/14/2018 1715   BILIRUBINUR Negative 12/29/2013 1214   KETONESUR NEGATIVE 09/14/2018 1715   PROTEINUR 30 (A) 09/14/2018 1715   NITRITE NEGATIVE  09/14/2018 1715   LEUKOCYTESUR NEGATIVE 09/14/2018 1715   LEUKOCYTESUR Negative 12/29/2013 1214   Sepsis Labs Invalid input(s): PROCALCITONIN,  WBC,  LACTICIDVEN Microbiology Recent Results (from the past 240 hour(s))  Novel Coronavirus, NAA (send-out to ref lab)     Status: None   Collection Time: 09/14/18  1:44 PM   Specimen: Nasopharyngeal Swab; Respiratory  Result Value Ref Range Status   SARS-CoV-2, NAA NOT DETECTED NOT DETECTED Final    Comment: (NOTE) This test was developed and its performance characteristics determined by [employer]. This test has not been FDA cleared or approved. This test has been authorized by FDA under an Emergency Use Authorization (EUA). This test is only authorized for the duration of time the declaration that circumstances exist justifying the authorization of the emergency use of in vitro diagnostic tests for detection of SARS-CoV-2 virus and/or diagnosis of COVID-19 infection under section 564(b)(1) of the Act, 21 U.S.C. [id number]), unless the authorization is terminated or revoked sooner. When diagnostic testing is negative, the possibility of a false negative result should be considered in the context of a patient's recent exposures and the presence of clinical signs and symptoms consistent with COVID-19. An individual without symptoms of COVID-19 and who is not shedding SARS-CoV-2 virus would expect to have a negative (not detected) result in this assay. Performed  At: [facility] [address] [place], [place] [id number] Rush Farmer MD [id number]    [place]  Final    Comment: Performed at [facility], [place] [address], [place], [place]  [id number]     Time coordinating discharge: 45 minutes  SIGNED:   Tawni Millers, MD  [facility] 09/19/2018, 10:54 AM

## 2018-09-19 NOTE — Plan of Care (Signed)
Patient stable, discussed POC with patient and sister, agreeable with plan, denies question/concerns at this time.

## 2018-09-19 NOTE — Progress Notes (Signed)
STROKE TEAM PROGRESS NOTE   INTERVAL HISTORY Patient lying in bed, dressed up for discharge.  He certainly more awake alert, coherent and interactive than before.  Still has psychomotor slowing, however orientated.  Still has Foley catheter, pending removal on discharge.  Vitals:   09/18/18 2331 09/19/18 0411 09/19/18 0730 09/19/18 1134  BP: (!) 143/70 135/73 130/64 130/78  Pulse: 63 65 (!) 57 66  Resp: 20 20 20 18   Temp: 99 F (37.2 C) 98.6 F (37 C) 98.1 F (36.7 C) 98 F (36.7 C)  TempSrc: Oral Oral Oral Oral  SpO2: 98% 100% 99% 100%  Weight:      Height:        CBC:  Recent Labs  Lab 09/14/18 1052  09/15/18 0639 09/16/18 0830  WBC 6.7  --  6.0 5.6  NEUTROABS 5.3  --   --  3.4  HGB 13.4   < > 12.7* 12.9*  HCT 40.4   < > 38.1* 38.2*  MCV 89.2  --  88.4 88.4  PLT 157  --  145* 147*   < > = values in this interval not displayed.    Basic Metabolic Panel:  Recent Labs  Lab 09/16/18 0830 09/17/18 0400  NA 134* 133*  K 3.2* 3.5  CL 102 106  CO2 20* 21*  GLUCOSE 86 113*  BUN 11 12  CREATININE 0.80 0.84  CALCIUM 8.5* 8.3*  MG 1.8  --    Lipid Panel:     Component Value Date/Time   CHOL 93 09/15/2018 0639   TRIG 65 09/15/2018 0639   HDL 39 (L) 09/15/2018 0639   CHOLHDL 2.4 09/15/2018 0639   VLDL 13 09/15/2018 0639   LDLCALC 41 09/15/2018 0639   HgbA1c:  Lab Results  Component Value Date   HGBA1C 7.3 (H) 09/14/2018   Urine Drug Screen:     Component Value Date/Time   LABOPIA NONE DETECTED 09/14/2018 1715   COCAINSCRNUR NONE DETECTED 09/14/2018 1715   LABBENZ NONE DETECTED 09/14/2018 1715   AMPHETMU NONE DETECTED 09/14/2018 1715   THCU NONE DETECTED 09/14/2018 1715   LABBARB NONE DETECTED 09/14/2018 1715    Alcohol Level     Component Value Date/Time   ETH <10 09/14/2018 1052    IMAGING Ct Head Wo Contrast 09/14/2018 IMPRESSION:  No acute intracranial process. Atrophy and chronic microvascular ischemic changes.   Ct Angio Head W Or Wo  Contrast Ct Angio Neck W And/or Wo Contrast Ct Cerebral Perfusion W Contrast 09/14/2018  IMPRESSION:  1. Area of ischemia in the anterior left frontal lobe. There is some bilateral increased T-max which is likely artifactual.  2. No significant core infarct.  3. Question luxury perfusion over the left hemisphere consistent with recent ischemia.  4. 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  5. The left vertebral artery is patent. There is flow in the distal right V4 segment to the posterior communicating artery.  6. 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  7. Prominent posterior communicating arteries bilaterally.  8. No other significant proximal large vessel occlusion.  9. No significant carotid stenosis in the neck. There is mild tortuosity.  10. Minimal atherosclerotic changes at the aortic arch without significant stenosis.    Mr Brain Wo Contrast 09/15/2018 IMPRESSION:  1. No acute intracranial infarct or other abnormality identified.  2. Multiple scattered chronic lacunar infarcts involving the right caudate, bilateral thalami, pons, and bilateral cerebellar hemispheres.  3. Moderately advanced cerebral atrophy  with mild chronic microvascular ischemic disease.   Transthoracic Echocardiogram  1. The left ventricle has normal systolic function, with an ejection fraction of 55-60%. The cavity size was normal. Left ventricular diastolic Doppler parameters are indeterminate. No evidence of left ventricular regional wall motion abnormalities. 2. The right ventricle has normal systolic function. The cavity was normal. There is no increase in right ventricular wall thickness. Right ventricular systolic pressure could not be assessed. 3. Left atrial size was mildly dilated. 4. The aortic valve is tricuspid. Mild calcification of the aortic valve. The noncoronary cusp is mildly thickened and restricted in motion. Aortic valve  regurgitation is mild by color flow Doppler. Mild aortic annular calcification noted. 5. The mitral valve is grossly normal. There is mild mitral annular calcification present. 6. The tricuspid valve is grossly normal. 7. The aorta is normal unless otherwise noted.  ECG - SR rate 71 BPM. (See cardiology reading for complete details)  EEG - ThisEEG recorded evidence of a non-specific lefthemispheric dysfunction. There was no seizure or predisposition seen.Please note that lack of epileptiform activity on EEG does not preclude the possibility of epilepsy.   LTM EEG 1. Background asymmetry, decreased left 2. Continuous slow, left hemisphere, maximal left fronto temporal 3. Intermittent rhythmic slow, generalized, maximal left frontotemporal IMPRESSION: This study is suggestive of cortical dysfunction in left hemisphere, maximal left frontotemporal region as well as mild diffuse encephalopathy. No seizures or epileptiform discharges were seen throughout the recording.  No results found.  PHYSICAL EXAM    Vitals:   09/18/18 2331 09/19/18 0411 09/19/18 0730 09/19/18 1134  BP: (!) 143/70 135/73 130/64 130/78  Pulse: 63 65 (!) 57 66  Resp: 20 20 20 18   Temp: 99 F (37.2 C) 98.6 F (37 C) 98.1 F (36.7 C) 98 F (36.7 C)  TempSrc: Oral Oral Oral Oral  SpO2: 98% 100% 99% 100%  Weight:      Height:        General - Well nourished, well developed, in no apparent distress.  Ophthalmologic - fundi not visualized due to noncooperation.  Cardiovascular - Regular rate and rhythm.  Neuro - pt awake alert, eyes open.   Paucity of speech, however orientated to his name, age, place and year, not 92 months, able to repeat sentences, naming 3/5, following all simple commands.  Visual field full. Bilateral gaze intact, tracking b/l. PERRL. No facial asymmetry. Moving all extremities equally. Coordination  intact bilaterally however slow action and sensation symmetrical bilaterally, gait  not tested.  ASSESSMENT/PLAN [name] is a 58 y.o. male with history of hypertension, diabetes mellitus, tobacco use, aspvd, hyperlipidemia, acute stroke in January 2020 presenting with confusion, right sided weakness, aphasia and recent fall. He did not receive IV t-PA due to late presentation (>4.5 hours from time of onset).  Seizure with prolonged post ictal state - likely related to hx of stroke  CT head  No acute abnormality. Small vessel disease. Atrophy.     CTA head & neck occluded R VA. Mod R cavernous ICA stenosis. Mild tortuosity in the neck. Minimal atherosclerosis aortic arch.   CT perfusion anterior L frontal lobe ischemia. No core infarct. ? Luxury perfusion L brain c/w recent ischemia.   MRI  No acute infarct. Mult old lacunes R caudate head, B thalamic, pons, and B cerebellum. Moderately advanced strophy and small vessel disease.   2D Echo EF 55-60%. No source of embolus   Spot EEG - left slowing, no seizure  LTM EEG -  L cortical dysfxn. Encephalopathy. No sz.   MRI DWI repeat no acute stroke  [employer] Virus 2 novel coronavirus - negative  LDL 41  HgbA1c 7.3  Heparin 5000 units sq tid for VTE prophylaxis  aspirin 81 mg daily and clopidogrel 75 mg daily prior to admission, now on aspirin 81 mg daily and clopidogrel 75 mg daily. continue at d/c.   Had keppra 1.5 g load, now on keppra 500mg  bid   Therapy recommendations:  CIR -> HHOT and HH PT  Disposition:  pending   Follow up [facility] at d/c  Hx of stroke  01/2018 admitted for dizziness and gait imbalance.  MRI showed left pontine infarct and old left thalamic infarct.  MRA showed right VA occlusion.  Carotid Doppler negative.  EF 55 to 60%.  LDL 84 and A1c 7.3.  He was put on DAPT and Lipitor 40.  Fever, resolved  Tmax 100 -> afebrile  Pt had cough after seizure episode  Concerning for aspiration  CXR NAD   Hypertension  Home BP meds: Norvasc 5 mg daily  Current BP meds:  none  Stable  Long-term BP goal normotensive  Hyperlipidemia  Home Lipid lowering medication: Lipitor 40 mg daily  LDL 41, goal < 70  Current lipid lowering medication: Lipitor 40 mg daily  Continue statin at discharge  Diabetes  Home diabetic meds: Metformin 1,000 mg Bid  HgbA1c 7.3, goal < 7.0  SSI  CBG monitoring  PCP close follow up  Tobacco abuse  Current smoker  Smoking cessation counseling provided  Pt is willing to quit  Other Stroke Risk Factors    Other Active Problems  Hypokalemia - 3.4->3.3->3.2 supplemented - 3.5  Hyponatremia - 132 ->133  Mild thrombocytopenia 147  Hospital day # 3  Neurology will sign off. Please call with questions. Pt will follow up with stroke clinic NP at [facility] in about 4 weeks. Thanks for the consult.  Rosalin Hawking, MD PhD Stroke Neurology 09/19/2018 2:20 PM   To contact Stroke Continuity provider, please refer to [email or web address] After hours, contact General Neurology

## 2018-10-22 NOTE — Progress Notes (Signed)
No show

## 2018-10-23 ENCOUNTER — Ambulatory Visit (INDEPENDENT_AMBULATORY_CARE_PROVIDER_SITE_OTHER): Payer: Self-pay | Admitting: Neurology

## 2018-10-23 DIAGNOSIS — Z5329 Procedure and treatment not carried out because of patient's decision for other reasons: Secondary | ICD-10-CM

## 2018-10-28 ENCOUNTER — Encounter: Payer: Self-pay | Admitting: Neurology

## 2019-07-12 ENCOUNTER — Other Ambulatory Visit: Payer: Self-pay

## 2019-07-12 ENCOUNTER — Emergency Department
Admission: EM | Admit: 2019-07-12 | Discharge: 2019-07-12 | Disposition: A | Discharge status: Home / Self Care | Payer: Self-pay | Attending: Emergency Medicine | Admitting: Emergency Medicine

## 2019-07-12 DIAGNOSIS — Y929 Unspecified place or not applicable: Secondary | ICD-10-CM | POA: Insufficient documentation

## 2019-07-12 DIAGNOSIS — Y999 Unspecified external cause status: Secondary | ICD-10-CM | POA: Insufficient documentation

## 2019-07-12 DIAGNOSIS — Z7984 Long term (current) use of oral hypoglycemic drugs: Secondary | ICD-10-CM | POA: Insufficient documentation

## 2019-07-12 DIAGNOSIS — E119 Type 2 diabetes mellitus without complications: Secondary | ICD-10-CM | POA: Insufficient documentation

## 2019-07-12 DIAGNOSIS — Z79899 Other long term (current) drug therapy: Secondary | ICD-10-CM | POA: Insufficient documentation

## 2019-07-12 DIAGNOSIS — Y9389 Activity, other specified: Secondary | ICD-10-CM | POA: Insufficient documentation

## 2019-07-12 DIAGNOSIS — W1839XA Other fall on same level, initial encounter: Secondary | ICD-10-CM | POA: Insufficient documentation

## 2019-07-12 DIAGNOSIS — I10 Essential (primary) hypertension: Secondary | ICD-10-CM | POA: Insufficient documentation

## 2019-07-12 DIAGNOSIS — I69351 Hemiplegia and hemiparesis following cerebral infarction affecting right dominant side: Secondary | ICD-10-CM | POA: Insufficient documentation

## 2019-07-12 DIAGNOSIS — W19XXXA Unspecified fall, initial encounter: Secondary | ICD-10-CM

## 2019-07-12 DIAGNOSIS — F17210 Nicotine dependence, cigarettes, uncomplicated: Secondary | ICD-10-CM | POA: Insufficient documentation

## 2019-07-12 DIAGNOSIS — Z7902 Long term (current) use of antithrombotics/antiplatelets: Secondary | ICD-10-CM | POA: Insufficient documentation

## 2019-07-12 DIAGNOSIS — R2689 Other abnormalities of gait and mobility: Secondary | ICD-10-CM | POA: Insufficient documentation

## 2019-07-12 DIAGNOSIS — E876 Hypokalemia: Secondary | ICD-10-CM | POA: Insufficient documentation

## 2019-07-12 LAB — CBC
HCT: 41.8 % (ref 39.0–52.0)
Hemoglobin: 14.7 g/dL (ref 13.0–17.0)
MCH: 30.2 pg (ref 26.0–34.0)
MCHC: 35.2 g/dL (ref 30.0–36.0)
MCV: 85.8 fL (ref 80.0–100.0)
Platelets: 149 10*3/uL — ABNORMAL LOW (ref 150–400)
RBC: 4.87 MIL/uL (ref 4.22–5.81)
RDW: 12.9 % (ref 11.5–15.5)
WBC: 5.8 10*3/uL (ref 4.0–10.5)
nRBC: 0 % (ref 0.0–0.2)

## 2019-07-12 LAB — URINALYSIS, COMPLETE (UACMP) WITH MICROSCOPIC
Bacteria, UA: NONE SEEN
Glucose, UA: 50 mg/dL — AB
Ketones, ur: 5 mg/dL — AB
Leukocytes,Ua: NEGATIVE
Nitrite: NEGATIVE
Protein, ur: NEGATIVE mg/dL
Specific Gravity, Urine: 1.019 (ref 1.005–1.030)
Squamous Epithelial / HPF: NONE SEEN (ref 0–5)
pH: 5 (ref 5.0–8.0)

## 2019-07-12 LAB — BASIC METABOLIC PANEL
Anion gap: 12 (ref 5–15)
BUN: 12 mg/dL (ref 6–20)
CO2: 24 mmol/L (ref 22–32)
Calcium: 9.4 mg/dL (ref 8.9–10.3)
Chloride: 98 mmol/L (ref 98–111)
Creatinine, Ser: 1.14 mg/dL (ref 0.61–1.24)
GFR calc Af Amer: >60 mL/min (ref >60)
GFR calc non Af Amer: >60 mL/min (ref >60)
Glucose, Bld: 125 mg/dL — ABNORMAL HIGH (ref 70–99)
Potassium: 3.1 mmol/L — ABNORMAL LOW (ref 3.5–5.1)
Sodium: 134 mmol/L — ABNORMAL LOW (ref 135–145)

## 2019-07-12 MED ORDER — POTASSIUM CHLORIDE 20 MEQ/15ML (10%) PO SOLN
40.0000 meq | Freq: Once | ORAL | Status: AC
  Administered 2019-07-12: 40 meq via ORAL
  Filled 2019-07-12: qty 30

## 2019-07-12 NOTE — ED Notes (Signed)
Patient is still unable to produce urine for analysis.

## 2019-07-12 NOTE — ED Triage Notes (Signed)
Pt arrives to ER via [facility]. Pt wanted to be checked for BP. Someone on scene told EMS he was confused but EMS reports A&O x 4. Does NOT take BP meds. EMS BP 162/90. Receiving 2nd COVID shot yesterday. 100% RA, 91 HR, 18 RR. EMS reports being called to residence for fall. FD helped pt up. States lost his balance. Fell about midnight. Stroke 1 year ago.   Pt c/o L arm sore from COVID shot. Denies pain anywhere else. Answering questions appropriately. A&O, speaking in clear complete sentences. Pt states "I feel weak in the knees."
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
## 2019-07-12 NOTE — ED Notes (Addendum)
In and out cath performed by  This RN, by sterile technique. Forskin retracted and replaced after procedure. Pt resting on bed. 264mL of clear yellow urine drained. Side rails returned to up position after procedure. 65f catheter

## 2019-07-12 NOTE — ED Notes (Signed)
[name], sister  [phone]

## 2019-07-12 NOTE — ED Notes (Signed)
Patient provided meal and oral fluids and instructed to provide urine sample for analysis.

## 2019-07-12 NOTE — ED Notes (Signed)
RN tried to assist pt to get urine. Pt unable to provide urine sample at this time.

## 2019-11-20 DIAGNOSIS — L602 Onychogryphosis: Secondary | ICD-10-CM | POA: Diagnosis not present

## 2019-11-20 DIAGNOSIS — L84 Corns and callosities: Secondary | ICD-10-CM | POA: Diagnosis not present

## 2019-11-20 DIAGNOSIS — M2041 Other hammer toe(s) (acquired), right foot: Secondary | ICD-10-CM | POA: Diagnosis not present

## 2019-11-20 DIAGNOSIS — L97512 Non-pressure chronic ulcer of other part of right foot with fat layer exposed: Secondary | ICD-10-CM | POA: Diagnosis not present

## 2019-11-20 DIAGNOSIS — E11621 Type 2 diabetes mellitus with foot ulcer: Secondary | ICD-10-CM | POA: Diagnosis not present

## 2019-11-20 DIAGNOSIS — L03031 Cellulitis of right toe: Secondary | ICD-10-CM | POA: Diagnosis not present

## 2019-11-20 DIAGNOSIS — L97519 Non-pressure chronic ulcer of other part of right foot with unspecified severity: Secondary | ICD-10-CM | POA: Diagnosis not present

## 2019-11-20 DIAGNOSIS — E1142 Type 2 diabetes mellitus with diabetic polyneuropathy: Secondary | ICD-10-CM | POA: Diagnosis not present

## 2019-12-03 DIAGNOSIS — M8950 Osteolysis, unspecified site: Secondary | ICD-10-CM | POA: Diagnosis not present

## 2019-12-03 DIAGNOSIS — S92321A Displaced fracture of second metatarsal bone, right foot, initial encounter for closed fracture: Secondary | ICD-10-CM | POA: Diagnosis not present

## 2019-12-03 DIAGNOSIS — L97519 Non-pressure chronic ulcer of other part of right foot with unspecified severity: Secondary | ICD-10-CM | POA: Diagnosis not present

## 2019-12-16 DIAGNOSIS — E11621 Type 2 diabetes mellitus with foot ulcer: Secondary | ICD-10-CM | POA: Diagnosis not present

## 2019-12-16 DIAGNOSIS — L97519 Non-pressure chronic ulcer of other part of right foot with unspecified severity: Secondary | ICD-10-CM | POA: Diagnosis not present

## 2019-12-16 DIAGNOSIS — F17210 Nicotine dependence, cigarettes, uncomplicated: Secondary | ICD-10-CM | POA: Diagnosis not present

## 2019-12-16 DIAGNOSIS — L03031 Cellulitis of right toe: Secondary | ICD-10-CM | POA: Diagnosis not present

## 2019-12-16 DIAGNOSIS — I10 Essential (primary) hypertension: Secondary | ICD-10-CM | POA: Diagnosis not present

## 2019-12-16 DIAGNOSIS — Z8673 Personal history of transient ischemic attack (TIA), and cerebral infarction without residual deficits: Secondary | ICD-10-CM | POA: Diagnosis not present

## 2019-12-16 DIAGNOSIS — E1151 Type 2 diabetes mellitus with diabetic peripheral angiopathy without gangrene: Secondary | ICD-10-CM | POA: Diagnosis not present

## 2019-12-16 DIAGNOSIS — Z89411 Acquired absence of right great toe: Secondary | ICD-10-CM | POA: Diagnosis not present

## 2019-12-16 DIAGNOSIS — I739 Peripheral vascular disease, unspecified: Secondary | ICD-10-CM | POA: Diagnosis not present

## 2020-01-27 DIAGNOSIS — I739 Peripheral vascular disease, unspecified: Secondary | ICD-10-CM | POA: Diagnosis not present

## 2020-03-02 DIAGNOSIS — Z7984 Long term (current) use of oral hypoglycemic drugs: Secondary | ICD-10-CM | POA: Diagnosis not present

## 2020-03-02 DIAGNOSIS — R569 Unspecified convulsions: Secondary | ICD-10-CM | POA: Diagnosis not present

## 2020-03-02 DIAGNOSIS — F17210 Nicotine dependence, cigarettes, uncomplicated: Secondary | ICD-10-CM | POA: Diagnosis not present

## 2020-03-02 DIAGNOSIS — I639 Cerebral infarction, unspecified: Secondary | ICD-10-CM | POA: Diagnosis not present

## 2020-03-02 DIAGNOSIS — M869 Osteomyelitis, unspecified: Secondary | ICD-10-CM | POA: Diagnosis not present

## 2020-03-02 DIAGNOSIS — M7989 Other specified soft tissue disorders: Secondary | ICD-10-CM | POA: Diagnosis not present

## 2020-03-02 DIAGNOSIS — I69959 Hemiplegia and hemiparesis following unspecified cerebrovascular disease affecting unspecified side: Secondary | ICD-10-CM | POA: Diagnosis not present

## 2020-03-02 DIAGNOSIS — I10 Essential (primary) hypertension: Secondary | ICD-10-CM | POA: Diagnosis not present

## 2020-03-02 DIAGNOSIS — E1169 Type 2 diabetes mellitus with other specified complication: Secondary | ICD-10-CM | POA: Diagnosis not present

## 2020-03-02 DIAGNOSIS — E1159 Type 2 diabetes mellitus with other circulatory complications: Secondary | ICD-10-CM | POA: Diagnosis not present

## 2020-03-02 DIAGNOSIS — L97519 Non-pressure chronic ulcer of other part of right foot with unspecified severity: Secondary | ICD-10-CM | POA: Diagnosis not present

## 2020-03-02 DIAGNOSIS — Z01818 Encounter for other preprocedural examination: Secondary | ICD-10-CM | POA: Diagnosis not present

## 2020-03-02 DIAGNOSIS — E11621 Type 2 diabetes mellitus with foot ulcer: Secondary | ICD-10-CM | POA: Diagnosis not present

## 2020-03-02 DIAGNOSIS — L97512 Non-pressure chronic ulcer of other part of right foot with fat layer exposed: Secondary | ICD-10-CM | POA: Diagnosis not present

## 2020-03-02 DIAGNOSIS — L03031 Cellulitis of right toe: Secondary | ICD-10-CM | POA: Diagnosis not present

## 2020-03-12 DIAGNOSIS — M86671 Other chronic osteomyelitis, right ankle and foot: Secondary | ICD-10-CM | POA: Diagnosis not present

## 2020-03-12 DIAGNOSIS — L03031 Cellulitis of right toe: Secondary | ICD-10-CM | POA: Diagnosis not present

## 2020-03-12 DIAGNOSIS — E785 Hyperlipidemia, unspecified: Secondary | ICD-10-CM | POA: Diagnosis not present

## 2020-03-12 DIAGNOSIS — M869 Osteomyelitis, unspecified: Secondary | ICD-10-CM | POA: Diagnosis not present

## 2020-03-12 DIAGNOSIS — Z89421 Acquired absence of other right toe(s): Secondary | ICD-10-CM | POA: Diagnosis not present

## 2020-03-12 DIAGNOSIS — L0390 Cellulitis, unspecified: Secondary | ICD-10-CM | POA: Diagnosis not present

## 2020-03-12 DIAGNOSIS — G40909 Epilepsy, unspecified, not intractable, without status epilepticus: Secondary | ICD-10-CM | POA: Diagnosis not present

## 2020-03-12 DIAGNOSIS — L97519 Non-pressure chronic ulcer of other part of right foot with unspecified severity: Secondary | ICD-10-CM | POA: Diagnosis not present

## 2020-03-12 DIAGNOSIS — E1151 Type 2 diabetes mellitus with diabetic peripheral angiopathy without gangrene: Secondary | ICD-10-CM | POA: Diagnosis not present

## 2020-03-12 DIAGNOSIS — F17210 Nicotine dependence, cigarettes, uncomplicated: Secondary | ICD-10-CM | POA: Diagnosis not present

## 2020-03-12 DIAGNOSIS — E1169 Type 2 diabetes mellitus with other specified complication: Secondary | ICD-10-CM | POA: Diagnosis not present

## 2020-03-12 DIAGNOSIS — E11621 Type 2 diabetes mellitus with foot ulcer: Secondary | ICD-10-CM | POA: Diagnosis not present

## 2020-03-12 DIAGNOSIS — I10 Essential (primary) hypertension: Secondary | ICD-10-CM | POA: Diagnosis not present

## 2020-03-12 DIAGNOSIS — L929 Granulomatous disorder of the skin and subcutaneous tissue, unspecified: Secondary | ICD-10-CM | POA: Diagnosis not present

## 2020-03-12 DIAGNOSIS — Z8673 Personal history of transient ischemic attack (TIA), and cerebral infarction without residual deficits: Secondary | ICD-10-CM | POA: Diagnosis not present

## 2020-03-12 DIAGNOSIS — E1142 Type 2 diabetes mellitus with diabetic polyneuropathy: Secondary | ICD-10-CM | POA: Diagnosis not present

## 2020-03-25 DIAGNOSIS — L989 Disorder of the skin and subcutaneous tissue, unspecified: Secondary | ICD-10-CM | POA: Diagnosis not present

## 2020-03-25 DIAGNOSIS — Z9889 Other specified postprocedural states: Secondary | ICD-10-CM | POA: Diagnosis not present

## 2020-03-25 DIAGNOSIS — E1140 Type 2 diabetes mellitus with diabetic neuropathy, unspecified: Secondary | ICD-10-CM | POA: Diagnosis not present

## 2020-07-01 DIAGNOSIS — G8191 Hemiplegia, unspecified affecting right dominant side: Secondary | ICD-10-CM | POA: Diagnosis not present

## 2020-07-01 DIAGNOSIS — B351 Tinea unguium: Secondary | ICD-10-CM | POA: Diagnosis not present

## 2020-07-01 DIAGNOSIS — Z87891 Personal history of nicotine dependence: Secondary | ICD-10-CM | POA: Diagnosis not present

## 2020-07-01 DIAGNOSIS — Z8673 Personal history of transient ischemic attack (TIA), and cerebral infarction without residual deficits: Secondary | ICD-10-CM | POA: Diagnosis not present

## 2020-07-01 DIAGNOSIS — Z89421 Acquired absence of other right toe(s): Secondary | ICD-10-CM | POA: Diagnosis not present

## 2020-07-01 DIAGNOSIS — G40909 Epilepsy, unspecified, not intractable, without status epilepticus: Secondary | ICD-10-CM | POA: Diagnosis not present

## 2020-07-01 DIAGNOSIS — I10 Essential (primary) hypertension: Secondary | ICD-10-CM | POA: Diagnosis not present

## 2020-07-01 DIAGNOSIS — B353 Tinea pedis: Secondary | ICD-10-CM | POA: Diagnosis not present

## 2020-07-01 DIAGNOSIS — Z89411 Acquired absence of right great toe: Secondary | ICD-10-CM | POA: Diagnosis not present

## 2020-07-01 DIAGNOSIS — E1151 Type 2 diabetes mellitus with diabetic peripheral angiopathy without gangrene: Secondary | ICD-10-CM | POA: Diagnosis not present

## 2020-07-01 DIAGNOSIS — E1142 Type 2 diabetes mellitus with diabetic polyneuropathy: Secondary | ICD-10-CM | POA: Diagnosis not present

## 2021-01-19 DIAGNOSIS — Z1389 Encounter for screening for other disorder: Secondary | ICD-10-CM | POA: Diagnosis not present

## 2021-01-19 DIAGNOSIS — R569 Unspecified convulsions: Secondary | ICD-10-CM | POA: Diagnosis not present

## 2021-01-19 DIAGNOSIS — E876 Hypokalemia: Secondary | ICD-10-CM | POA: Diagnosis not present

## 2021-01-19 DIAGNOSIS — Z23 Encounter for immunization: Secondary | ICD-10-CM | POA: Diagnosis not present

## 2021-01-19 DIAGNOSIS — E119 Type 2 diabetes mellitus without complications: Secondary | ICD-10-CM | POA: Diagnosis not present

## 2021-01-19 DIAGNOSIS — I10 Essential (primary) hypertension: Secondary | ICD-10-CM | POA: Diagnosis not present

## 2021-01-19 DIAGNOSIS — Z8673 Personal history of transient ischemic attack (TIA), and cerebral infarction without residual deficits: Secondary | ICD-10-CM | POA: Diagnosis not present

## 2021-01-19 DIAGNOSIS — Z1159 Encounter for screening for other viral diseases: Secondary | ICD-10-CM | POA: Diagnosis not present

## 2021-01-19 DIAGNOSIS — Z0130 Encounter for examination of blood pressure without abnormal findings: Secondary | ICD-10-CM | POA: Diagnosis not present

## 2021-04-23 ENCOUNTER — Emergency Department: Payer: Medicare Other

## 2021-04-23 ENCOUNTER — Other Ambulatory Visit: Payer: Self-pay

## 2021-04-23 ENCOUNTER — Inpatient Hospital Stay
Admission: EM | Admit: 2021-04-23 | Discharge: 2021-05-06 | DRG: 616 | Disposition: A | Discharge status: Rehabilitation Facility | Payer: Medicare Other | Attending: Internal Medicine | Admitting: Internal Medicine

## 2021-04-23 DIAGNOSIS — Z6825 Body mass index (BMI) 25.0-25.9, adult: Secondary | ICD-10-CM

## 2021-04-23 DIAGNOSIS — E119 Type 2 diabetes mellitus without complications: Secondary | ICD-10-CM

## 2021-04-23 DIAGNOSIS — E871 Hypo-osmolality and hyponatremia: Secondary | ICD-10-CM | POA: Diagnosis not present

## 2021-04-23 DIAGNOSIS — L03031 Cellulitis of right toe: Secondary | ICD-10-CM | POA: Diagnosis present

## 2021-04-23 DIAGNOSIS — I21A1 Myocardial infarction type 2: Secondary | ICD-10-CM | POA: Diagnosis present

## 2021-04-23 DIAGNOSIS — Z833 Family history of diabetes mellitus: Secondary | ICD-10-CM

## 2021-04-23 DIAGNOSIS — S91301A Unspecified open wound, right foot, initial encounter: Secondary | ICD-10-CM | POA: Diagnosis not present

## 2021-04-23 DIAGNOSIS — M869 Osteomyelitis, unspecified: Secondary | ICD-10-CM | POA: Diagnosis present

## 2021-04-23 DIAGNOSIS — E876 Hypokalemia: Secondary | ICD-10-CM | POA: Diagnosis not present

## 2021-04-23 DIAGNOSIS — F17210 Nicotine dependence, cigarettes, uncomplicated: Secondary | ICD-10-CM | POA: Diagnosis present

## 2021-04-23 DIAGNOSIS — R339 Retention of urine, unspecified: Secondary | ICD-10-CM | POA: Diagnosis not present

## 2021-04-23 DIAGNOSIS — L97519 Non-pressure chronic ulcer of other part of right foot with unspecified severity: Secondary | ICD-10-CM | POA: Diagnosis present

## 2021-04-23 DIAGNOSIS — E1142 Type 2 diabetes mellitus with diabetic polyneuropathy: Secondary | ICD-10-CM | POA: Diagnosis present

## 2021-04-23 DIAGNOSIS — R338 Other retention of urine: Secondary | ICD-10-CM | POA: Diagnosis not present

## 2021-04-23 DIAGNOSIS — Z7902 Long term (current) use of antithrombotics/antiplatelets: Secondary | ICD-10-CM

## 2021-04-23 DIAGNOSIS — S81801A Unspecified open wound, right lower leg, initial encounter: Secondary | ICD-10-CM

## 2021-04-23 DIAGNOSIS — I2699 Other pulmonary embolism without acute cor pulmonale: Secondary | ICD-10-CM | POA: Diagnosis present

## 2021-04-23 DIAGNOSIS — Z20822 Contact with and (suspected) exposure to covid-19: Secondary | ICD-10-CM | POA: Diagnosis present

## 2021-04-23 DIAGNOSIS — E1151 Type 2 diabetes mellitus with diabetic peripheral angiopathy without gangrene: Secondary | ICD-10-CM | POA: Diagnosis present

## 2021-04-23 DIAGNOSIS — I2510 Atherosclerotic heart disease of native coronary artery without angina pectoris: Secondary | ICD-10-CM | POA: Diagnosis present

## 2021-04-23 DIAGNOSIS — I739 Peripheral vascular disease, unspecified: Secondary | ICD-10-CM | POA: Diagnosis present

## 2021-04-23 DIAGNOSIS — I70235 Atherosclerosis of native arteries of right leg with ulceration of other part of foot: Secondary | ICD-10-CM | POA: Diagnosis not present

## 2021-04-23 DIAGNOSIS — I5032 Chronic diastolic (congestive) heart failure: Secondary | ICD-10-CM | POA: Diagnosis present

## 2021-04-23 DIAGNOSIS — M86671 Other chronic osteomyelitis, right ankle and foot: Secondary | ICD-10-CM | POA: Diagnosis present

## 2021-04-23 DIAGNOSIS — E11621 Type 2 diabetes mellitus with foot ulcer: Secondary | ICD-10-CM | POA: Diagnosis present

## 2021-04-23 DIAGNOSIS — E1165 Type 2 diabetes mellitus with hyperglycemia: Secondary | ICD-10-CM | POA: Diagnosis not present

## 2021-04-23 DIAGNOSIS — Z89412 Acquired absence of left great toe: Secondary | ICD-10-CM | POA: Diagnosis not present

## 2021-04-23 DIAGNOSIS — Z7984 Long term (current) use of oral hypoglycemic drugs: Secondary | ICD-10-CM

## 2021-04-23 DIAGNOSIS — Z89411 Acquired absence of right great toe: Secondary | ICD-10-CM | POA: Diagnosis not present

## 2021-04-23 DIAGNOSIS — I248 Other forms of acute ischemic heart disease: Secondary | ICD-10-CM | POA: Diagnosis not present

## 2021-04-23 DIAGNOSIS — I10 Essential (primary) hypertension: Secondary | ICD-10-CM | POA: Diagnosis present

## 2021-04-23 DIAGNOSIS — E860 Dehydration: Secondary | ICD-10-CM | POA: Diagnosis present

## 2021-04-23 DIAGNOSIS — R778 Other specified abnormalities of plasma proteins: Secondary | ICD-10-CM | POA: Diagnosis not present

## 2021-04-23 DIAGNOSIS — G40909 Epilepsy, unspecified, not intractable, without status epilepticus: Secondary | ICD-10-CM | POA: Diagnosis present

## 2021-04-23 DIAGNOSIS — L98494 Non-pressure chronic ulcer of skin of other sites with necrosis of bone: Secondary | ICD-10-CM | POA: Diagnosis present

## 2021-04-23 DIAGNOSIS — R627 Adult failure to thrive: Secondary | ICD-10-CM | POA: Diagnosis present

## 2021-04-23 DIAGNOSIS — I639 Cerebral infarction, unspecified: Secondary | ICD-10-CM | POA: Diagnosis not present

## 2021-04-23 DIAGNOSIS — Z79899 Other long term (current) drug therapy: Secondary | ICD-10-CM | POA: Diagnosis not present

## 2021-04-23 DIAGNOSIS — Z7982 Long term (current) use of aspirin: Secondary | ICD-10-CM

## 2021-04-23 DIAGNOSIS — I7092 Chronic total occlusion of artery of the extremities: Secondary | ICD-10-CM | POA: Diagnosis not present

## 2021-04-23 DIAGNOSIS — G4700 Insomnia, unspecified: Secondary | ICD-10-CM | POA: Diagnosis not present

## 2021-04-23 DIAGNOSIS — R5381 Other malaise: Secondary | ICD-10-CM | POA: Diagnosis not present

## 2021-04-23 DIAGNOSIS — I110 Hypertensive heart disease with heart failure: Secondary | ICD-10-CM | POA: Diagnosis present

## 2021-04-23 DIAGNOSIS — R531 Weakness: Secondary | ICD-10-CM | POA: Diagnosis present

## 2021-04-23 DIAGNOSIS — E1169 Type 2 diabetes mellitus with other specified complication: Secondary | ICD-10-CM | POA: Diagnosis present

## 2021-04-23 DIAGNOSIS — N3000 Acute cystitis without hematuria: Secondary | ICD-10-CM | POA: Diagnosis not present

## 2021-04-23 DIAGNOSIS — M79604 Pain in right leg: Secondary | ICD-10-CM

## 2021-04-23 DIAGNOSIS — E1369 Other specified diabetes mellitus with other specified complication: Secondary | ICD-10-CM | POA: Diagnosis not present

## 2021-04-23 DIAGNOSIS — M868X7 Other osteomyelitis, ankle and foot: Secondary | ICD-10-CM | POA: Diagnosis not present

## 2021-04-23 DIAGNOSIS — E785 Hyperlipidemia, unspecified: Secondary | ICD-10-CM | POA: Diagnosis present

## 2021-04-23 DIAGNOSIS — R509 Fever, unspecified: Secondary | ICD-10-CM | POA: Diagnosis not present

## 2021-04-23 DIAGNOSIS — I6389 Other cerebral infarction: Secondary | ICD-10-CM | POA: Diagnosis not present

## 2021-04-23 DIAGNOSIS — K5900 Constipation, unspecified: Secondary | ICD-10-CM | POA: Diagnosis not present

## 2021-04-23 DIAGNOSIS — Z8673 Personal history of transient ischemic attack (TIA), and cerebral infarction without residual deficits: Secondary | ICD-10-CM | POA: Diagnosis not present

## 2021-04-23 LAB — LACTIC ACID, PLASMA: Lactic Acid, Venous: 1.2 mmol/L (ref 0.5–1.9)

## 2021-04-23 LAB — BASIC METABOLIC PANEL
Anion gap: 9 (ref 5–15)
BUN: 19 mg/dL (ref 6–20)
CO2: 26 mmol/L (ref 22–32)
Calcium: 8.7 mg/dL — ABNORMAL LOW (ref 8.9–10.3)
Chloride: 99 mmol/L (ref 98–111)
Creatinine, Ser: 0.67 mg/dL (ref 0.61–1.24)
GFR, Estimated: >60 mL/min (ref >60)
Glucose, Bld: 146 mg/dL — ABNORMAL HIGH (ref 70–99)
Potassium: 3.4 mmol/L — ABNORMAL LOW (ref 3.5–5.1)
Sodium: 134 mmol/L — ABNORMAL LOW (ref 135–145)

## 2021-04-23 LAB — CBC
HCT: 43 % (ref 39.0–52.0)
Hemoglobin: 14.2 g/dL (ref 13.0–17.0)
MCH: 29 pg (ref 26.0–34.0)
MCHC: 33 g/dL (ref 30.0–36.0)
MCV: 87.9 fL (ref 80.0–100.0)
Platelets: 161 10*3/uL (ref 150–400)
RBC: 4.89 MIL/uL (ref 4.22–5.81)
RDW: 12.4 % (ref 11.5–15.5)
WBC: 6.7 10*3/uL (ref 4.0–10.5)
nRBC: 0 % (ref 0.0–0.2)

## 2021-04-23 LAB — URINALYSIS, COMPLETE (UACMP) WITH MICROSCOPIC
Bilirubin Urine: NEGATIVE
Glucose, UA: NEGATIVE mg/dL
Hgb urine dipstick: NEGATIVE
Ketones, ur: NEGATIVE mg/dL
Nitrite: NEGATIVE
Protein, ur: NEGATIVE mg/dL
Specific Gravity, Urine: 1.024 (ref 1.005–1.030)
Squamous Epithelial / HPF: NONE SEEN (ref 0–5)
pH: 5 (ref 5.0–8.0)

## 2021-04-23 LAB — TROPONIN I (HIGH SENSITIVITY)
Troponin I (High Sensitivity): 65 ng/L — ABNORMAL HIGH (ref <18)
Troponin I (High Sensitivity): 80 ng/L — ABNORMAL HIGH (ref <18)

## 2021-04-23 LAB — RESP PANEL BY RT-PCR (FLU A&B, COVID) ARPGX2
Influenza A by PCR: NEGATIVE
Influenza B by PCR: NEGATIVE
SARS Coronavirus 2 by RT PCR: NEGATIVE

## 2021-04-23 LAB — APTT: aPTT: 35 seconds (ref 24–36)

## 2021-04-23 MED ORDER — HEPARIN BOLUS VIA INFUSION
4000.0000 [IU] | Freq: Once | INTRAVENOUS | Status: AC
  Administered 2021-04-23: 4000 [IU] via INTRAVENOUS
  Filled 2021-04-23: qty 4000

## 2021-04-23 MED ORDER — ATORVASTATIN CALCIUM 20 MG PO TABS
40.0000 mg | ORAL_TABLET | Freq: Every day | ORAL | Status: DC
  Administered 2021-04-24: 40 mg via ORAL
  Filled 2021-04-23: qty 2

## 2021-04-23 MED ORDER — SODIUM CHLORIDE 0.9 % IV SOLN: INTRAVENOUS | Status: AC

## 2021-04-23 MED ORDER — SENNOSIDES-DOCUSATE SODIUM 8.6-50 MG PO TABS
1.0000 | ORAL_TABLET | Freq: Every evening | ORAL | Status: DC | PRN
  Administered 2021-05-02 – 2021-05-05 (×2): 1 via ORAL
  Filled 2021-04-23 (×2): qty 1

## 2021-04-23 MED ORDER — ACETAMINOPHEN 650 MG RE SUPP: 650.0000 mg | RECTAL | Status: DC | PRN

## 2021-04-23 MED ORDER — ASPIRIN EC 81 MG PO TBEC
81.0000 mg | DELAYED_RELEASE_TABLET | Freq: Every day | ORAL | Status: DC
  Administered 2021-04-24 – 2021-04-26 (×3): 81 mg via ORAL
  Filled 2021-04-23 (×3): qty 1

## 2021-04-23 MED ORDER — ACETAMINOPHEN 160 MG/5ML PO SOLN
650.0000 mg | ORAL | Status: DC | PRN
  Filled 2021-04-23: qty 20.3

## 2021-04-23 MED ORDER — ACETAMINOPHEN 325 MG PO TABS
650.0000 mg | ORAL_TABLET | ORAL | Status: DC | PRN
  Administered 2021-05-03 – 2021-05-05 (×3): 650 mg via ORAL
  Filled 2021-04-23 (×3): qty 2

## 2021-04-23 MED ORDER — STROKE: EARLY STAGES OF RECOVERY BOOK: Freq: Once | Status: DC

## 2021-04-23 MED ORDER — HEPARIN (PORCINE) 25000 UT/250ML-% IV SOLN
1200.0000 [IU]/h | INTRAVENOUS | Status: DC
  Administered 2021-04-23: 850 [IU]/h via INTRAVENOUS
  Administered 2021-04-24: 1200 [IU]/h via INTRAVENOUS
  Administered 2021-04-24: 850 [IU]/h via INTRAVENOUS
  Filled 2021-04-23 (×2): qty 250

## 2021-04-23 MED ORDER — LEVETIRACETAM 500 MG PO TABS
500.0000 mg | ORAL_TABLET | Freq: Two times a day (BID) | ORAL | Status: DC
  Administered 2021-04-24 – 2021-05-06 (×25): 500 mg via ORAL
  Filled 2021-04-23 (×25): qty 1

## 2021-04-23 MED ORDER — AMLODIPINE BESYLATE 5 MG PO TABS
2.5000 mg | ORAL_TABLET | Freq: Every day | ORAL | Status: DC
  Administered 2021-04-24 – 2021-04-26 (×3): 2.5 mg via ORAL
  Filled 2021-04-23 (×3): qty 1

## 2021-04-23 MED ORDER — CLOPIDOGREL BISULFATE 75 MG PO TABS
75.0000 mg | ORAL_TABLET | Freq: Every day | ORAL | Status: DC
  Administered 2021-04-24 – 2021-04-25 (×2): 75 mg via ORAL
  Filled 2021-04-23 (×2): qty 1

## 2021-04-23 NOTE — ED Triage Notes (Signed)
Pt from home, c/o weakness x3 days that has left him bedbound, pt lives with brother- states family comes daily to check on him. Pt found soaked in urine by EMS. Pt states he hasn't eaten today. Pt denies CP, SOB, N/V/D, cough, or pain. Pt is AOX4, NAD noted right big toe and second toe amputated 3 months ago per pt. Site appears clean dry and intact. No tenderness swelling or discharge noted, CMS intact.  ?Pt is diabetic- BGL 185. ?

## 2021-04-23 NOTE — ED Provider Notes (Signed)
? ?[facility] ?Provider Note ? ? ? Event Date/Time  ? First MD Initiated Contact with Patient 04/23/21 1648   ?  (approximate) ? ?History  ? ?Chief Complaint: Weakness ? ?HPI ? ?[name] is a 61 y.o. male with a past medical history of diabetes, prior CVA with weakness presents to the emergency department for generalized weakness.  According to EMS per family member for the past 3 days the patient has been very weak and unable to get out of bed.  Patient lives with a brother and other family comes to check on him daily.  EMS states the patient was soaked in urine states he has not eaten anything today.  Currently patient is alert and oriented.  Admits to feeling weak but denies any focal complaints such as chest pain abdominal pain, nausea vomiting diarrhea.  Patient does smell strongly of urine. ? ?Physical Exam  ? ?Triage Vital Signs: ?ED Triage Vitals  ?Enc Vitals Group  ?   BP 04/23/21 1650 (!) 164/91  ?   Pulse Rate 04/23/21 1650 77  ?   Resp 04/23/21 1650 18  ?   Temp 04/23/21 1656 98.9 ?F (37.2 ?C)  ?   Temp Source 04/23/21 1656 Oral  ?   SpO2 04/23/21 1650 98 %  ?   Weight --   ?   Height --   ?   Head Circumference --   ?   Peak Flow --   ?   Pain Score 04/23/21 1702 0  ?   Pain Loc --   ?   Pain Edu? --   ?   Excl. in [place]? --   ? ? ?Most recent vital signs: ?Vitals:  ? 04/23/21 1700 04/23/21 1730  ?BP: (!) 155/93 (!) 163/91  ?Pulse: 78 75  ?Resp: 19   ?Temp:    ?SpO2: 98% 96%  ? ? ?General: Awake, no distress.  ?CV:  Good peripheral perfusion.  Regular rate and rhythm  ?Resp:  Normal effort.  Equal breath sounds bilaterally.  ?Abd:  No distention.  Soft, nontender.  No rebound or guarding. ?Other:  Prior right great toe amputation, some dried blood to the area, no obvious signs of cellulitis. ? ? ?ED Results / Procedures / Treatments  ? ?EKG ? ?EKG viewed and interpreted by myself shows a normal sinus rhythm at 77 bpm with a narrow QRS, normal axis, normal intervals, no  concerning ST changes. ? ?RADIOLOGY ? ?I personally reviewed the CT images patient appears to have large ventricles but otherwise no significant abnormality seen on my evaluation. ?Radiology shows new subacute to chronic right cerebellar hemisphere infarct. ? ? ?MEDICATIONS ORDERED IN ED: ?Medications - No data to display ? ? ?IMPRESSION / MDM / ASSESSMENT AND PLAN / ED COURSE  ?I reviewed the triage vital signs and the nursing notes. ? ?Patient presents to the emergency department for 3 days of generalized weakness and fatigue.  Differential is quite broad would include infectious etiology such as urinary tract infection, COVID, metabolic or electrolyte abnormality, dehydration, CVA or ICH.  We will check labs, urine, obtain a COVID swab, CT scan of the head and continue to closely monitor. ? ?Patient's labs have resulted showing a troponin elevation which has increased on repeat.  We will cover with IV heparin.  Patient CT scan shows a new subacute to chronic right cerebellar infarct, we will proceed with MR imaging.  Urinalysis is equivocal but does not appear overly concerning for UTI we  will send urine culture.  COVID and flu is negative.  BMP is largely nonrevealing.  Normal CBC including normal white blood cell count.  We will admit to the hospital service for ongoing management and further work-up. ? ?CRITICAL CARE ?Performed by: Harvest Dark ? ? ?Total critical care time: 30 minutes ? ?Critical care time was exclusive of separately billable procedures and treating other patients. ? ?Critical care was necessary to treat or prevent imminent or life-threatening deterioration. ? ?Critical care was time spent personally by me on the following activities: development of treatment plan with patient and/or surrogate as well as nursing, discussions with consultants, evaluation of patient's response to treatment, examination of patient, obtaining history from patient or surrogate, ordering and performing  treatments and interventions, ordering and review of laboratory studies, ordering and review of radiographic studies, pulse oximetry and re-evaluation of patient's condition. ? ? ?FINAL CLINICAL IMPRESSION(S) / ED DIAGNOSES  ? ?Weakness ?CVA ?Elevated troponin/NSTEMI ? ?Note:  This document was prepared using Dragon voice recognition software and may include unintentional dictation errors. ?  Harvest Dark, MD ?04/23/21 2219 ? ?

## 2021-04-23 NOTE — ED Notes (Signed)
Patient transported to MRI 

## 2021-04-23 NOTE — ED Notes (Addendum)
Attempted in and out catheter x2 without success, MD notified. Pt connected to male pure wick and cleaned of urine- linens changed.  ?
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
## 2021-04-23 NOTE — ED Notes (Signed)
Heparin paused by MD request at this time.  ?

## 2021-04-23 NOTE — Progress Notes (Signed)
ANTICOAGULATION CONSULT NOTE ? ?Pharmacy Consult for heparin infusion ?Indication: chest pain/ACS ? ?No Known Allergies ? ?Patient Measurements: ?Height: 5\' 7"  (170.2 cm) ?Weight: 74.8 kg (165 lb) ?IBW/kg (Calculated) : 66.1 ?Heparin Dosing Weight: 74.8 kg ? ?Vital Signs: ?Temp: 98.9 ?F (37.2 ?C) (04/01 1656) ?Temp Source: Oral (04/01 1656) ?BP: 136/90 (04/01 2000) ?Pulse Rate: 75 (04/01 2000) ? ?Labs: ?Recent Labs  ?  04/23/21 ?1657 04/23/21 ?1903  ?HGB 14.2  --   ?HCT 43.0  --   ?PLT 161  --   ?CREATININE 0.67  --   ?TROPONINIHS 65* 80*  ? ? ?Estimated Creatinine Clearance: 94.1 mL/min (by C-G formula based on SCr of 0.67 mg/dL). ? ? ?Medical History: ?Past Medical History:  ?Diagnosis Date  ? Diabetes mellitus without complication ([place])   ? Stroke [facility]) 01/2018  ? Per Brother   ? ? ?Medications:  ?Per chart review, pt not on anticoagulation prior to admission ? ?Assessment: ?61 y.Gregory. male with a past medical history of diabetes, prior CVA with weakness who presented to the ED for generalized weakness. Pharmacy consulted for heparin dosing/monitoring.  ? ?Goal of Therapy:  ?Heparin level 0.3-0.7 units/ml ?Monitor platelets by anticoagulation protocol: Yes ?  ?Plan:  ?Give 4000 units bolus x 1 ?Start heparin infusion at 850 units/hr ?Check anti-Xa level in 6 hours and daily while on heparin ?Continue to monitor H&H and platelets ? ?[name] [name] ?04/23/2021,8:15 PM ? ? ?

## 2021-04-24 ENCOUNTER — Inpatient Hospital Stay: Payer: Medicare Other

## 2021-04-24 DIAGNOSIS — I6389 Other cerebral infarction: Secondary | ICD-10-CM

## 2021-04-24 DIAGNOSIS — I2699 Other pulmonary embolism without acute cor pulmonale: Secondary | ICD-10-CM | POA: Diagnosis not present

## 2021-04-24 DIAGNOSIS — R778 Other specified abnormalities of plasma proteins: Secondary | ICD-10-CM

## 2021-04-24 DIAGNOSIS — I639 Cerebral infarction, unspecified: Secondary | ICD-10-CM | POA: Diagnosis not present

## 2021-04-24 LAB — HEPARIN LEVEL (UNFRACTIONATED)
Heparin Unfractionated: <0.1 IU/mL — ABNORMAL LOW (ref 0.30–0.70)
Heparin Unfractionated: 0.21 IU/mL — ABNORMAL LOW (ref 0.30–0.70)
Heparin Unfractionated: 0.35 IU/mL (ref 0.30–0.70)

## 2021-04-24 LAB — BLOOD GAS, ARTERIAL
Acid-Base Excess: 3.4 mmol/L — ABNORMAL HIGH (ref 0.0–2.0)
Bicarbonate: 26.8 mmol/L (ref 20.0–28.0)
O2 Saturation: 97.3 %
Patient temperature: 37
pCO2 arterial: 36 mmHg (ref 32–48)
pH, Arterial: 7.48 — ABNORMAL HIGH (ref 7.35–7.45)
pO2, Arterial: 77 mmHg — ABNORMAL LOW (ref 83–108)

## 2021-04-24 LAB — CBC
HCT: 44.9 % (ref 39.0–52.0)
Hemoglobin: 14.4 g/dL (ref 13.0–17.0)
MCH: 28.7 pg (ref 26.0–34.0)
MCHC: 32.1 g/dL (ref 30.0–36.0)
MCV: 89.4 fL (ref 80.0–100.0)
Platelets: 163 10*3/uL (ref 150–400)
RBC: 5.02 MIL/uL (ref 4.22–5.81)
RDW: 12.5 % (ref 11.5–15.5)
WBC: 7 10*3/uL (ref 4.0–10.5)
nRBC: 0 % (ref 0.0–0.2)

## 2021-04-24 LAB — RESPIRATORY PANEL BY PCR

## 2021-04-24 LAB — LIPID PANEL
Cholesterol: 82 mg/dL (ref 0–200)
HDL: 35 mg/dL — ABNORMAL LOW (ref >40)
LDL Cholesterol: 33 mg/dL (ref 0–99)
Total CHOL/HDL Ratio: 2.3 RATIO
Triglycerides: 70 mg/dL (ref <150)
VLDL: 14 mg/dL (ref 0–40)

## 2021-04-24 LAB — MAGNESIUM: Magnesium: 2 mg/dL (ref 1.7–2.4)

## 2021-04-24 LAB — HIV ANTIBODY (ROUTINE TESTING W REFLEX): HIV Screen 4th Generation wRfx: NONREACTIVE

## 2021-04-24 LAB — D-DIMER, QUANTITATIVE: D-Dimer, Quant: 1.05 ug/mL-FEU — ABNORMAL HIGH (ref 0.00–0.50)

## 2021-04-24 LAB — CORTISOL: Cortisol, Plasma: 14.7 ug/dL

## 2021-04-24 LAB — SEDIMENTATION RATE: Sed Rate: 27 mm/hr — ABNORMAL HIGH (ref 0–20)

## 2021-04-24 LAB — TROPONIN I (HIGH SENSITIVITY)
Troponin I (High Sensitivity): 65 ng/L — ABNORMAL HIGH (ref <18)
Troponin I (High Sensitivity): 64 ng/L — ABNORMAL HIGH (ref <18)

## 2021-04-24 LAB — CK: Total CK: 169 U/L (ref 49–397)

## 2021-04-24 LAB — PROCALCITONIN: Procalcitonin: <0.1 ng/mL

## 2021-04-24 MED ORDER — ATORVASTATIN CALCIUM 20 MG PO TABS
20.0000 mg | ORAL_TABLET | Freq: Every day | ORAL | Status: DC
  Administered 2021-04-24 – 2021-05-05 (×11): 20 mg via ORAL
  Filled 2021-04-24 (×11): qty 1

## 2021-04-24 MED ORDER — POTASSIUM CHLORIDE 10 MEQ/100ML IV SOLN
10.0000 meq | INTRAVENOUS | Status: AC
  Administered 2021-04-24 (×2): 10 meq via INTRAVENOUS
  Filled 2021-04-24 (×2): qty 100

## 2021-04-24 MED ORDER — IOHEXOL 350 MG/ML SOLN
75.0000 mL | Freq: Once | INTRAVENOUS | Status: AC | PRN
  Administered 2021-04-24: 75 mL via INTRAVENOUS

## 2021-04-24 MED ORDER — HEPARIN BOLUS VIA INFUSION
4000.0000 [IU] | Freq: Once | INTRAVENOUS | Status: DC
  Filled 2021-04-24: qty 4000

## 2021-04-24 NOTE — Progress Notes (Signed)
ANTICOAGULATION CONSULT NOTE ? ?Pharmacy Consult for heparin infusion ?Indication: chest pain/ACS ? ?No Known Allergies ? ?Patient Measurements: ?Height: 5\' 7"  (170.2 cm) ?Weight: 72.1 kg (158 lb 14.4 oz) ?IBW/kg (Calculated) : 66.1 ?Heparin Dosing Weight: 74.8 kg ? ?Vital Signs: ?Temp: 98.7 ?F (37.1 ?C) (04/02 1415) ?BP: 137/76 (04/02 1415) ?Pulse Rate: 68 (04/02 1415) ? ?Labs: ?Recent Labs  ?  04/23/21 ?1657 04/23/21 ?1903 04/23/21 ?2037 04/24/21 ?8270 04/24/21 ?0308 04/24/21 ?0701 04/24/21 ?1456  ?HGB 14.2  --   --   --  14.4  --   --   ?HCT 43.0  --   --   --  44.9  --   --   ?PLT 161  --   --   --  163  --   --   ?APTT  --   --  35  --   --   --   --   ?HEPARINUNFRC  --   --   --   --   --  <0.10* 0.21*  ?CREATININE 0.67  --   --   --   --   --   --   ?CKTOTAL  --   --   --  169  --   --   --   ?TROPONINIHS 65* 80*  --  65* 64*  --   --   ? ? ? ?Estimated Creatinine Clearance: 94.1 mL/min (by C-G formula based on SCr of 0.67 mg/dL). ? ? ?Medical History: ?Past Medical History:  ?Diagnosis Date  ? Diabetes mellitus without complication ([place])   ? Stroke [facility]) 01/2018  ? Per Brother   ? ? ?Medications:  ?Per chart review, pt not on anticoagulation prior to admission ? ?Assessment: ?61 y.o. male with a past medical history of diabetes, prior CVA with weakness who presented to the ED for generalized weakness. Pharmacy consulted for heparin dosing/monitoring.  ? ?4/2 0701 HL <0.10 ?4/2 1456 HL 0.21 ? ?Goal of Therapy:  ?Heparin level 0.3-0.7 units/ml ?Monitor platelets by anticoagulation protocol: Yes ?  ?Plan:  ?Heparin level is subtherapeutic. Due to concern for CVA/TIA will not bolus and only increase infusion to 1200 units/hr. Recheck heparin level in 6 hours. CBC daily while on heparin.   ? ?Sherilyn Banker, PharmD ?04/24/2021,3:29 PM ? ? ?

## 2021-04-24 NOTE — Progress Notes (Signed)
ANTICOAGULATION CONSULT NOTE ? ?Pharmacy Consult for heparin infusion ?Indication: chest pain/ACS ? ?No Known Allergies ? ?Patient Measurements: ?Height: 5\' 7"  (170.2 cm) ?Weight: 72.1 kg (158 lb 14.4 oz) ?IBW/kg (Calculated) : 66.1 ?Heparin Dosing Weight: 74.8 kg ? ?Vital Signs: ?Temp: 98.6 ?F (37 ?C) (04/02 2307) ?BP: 139/88 (04/02 2307) ?Pulse Rate: 70 (04/02 2307) ? ?Labs: ?Recent Labs  ?  04/23/21 ?1657 04/23/21 ?1903 04/23/21 ?2037 04/24/21 ?7741 04/24/21 ?0308 04/24/21 ?0701 04/24/21 ?1456 04/24/21 ?2207  ?HGB 14.2  --   --   --  14.4  --   --   --   ?HCT 43.0  --   --   --  44.9  --   --   --   ?PLT 161  --   --   --  163  --   --   --   ?APTT  --   --  35  --   --   --   --   --   ?HEPARINUNFRC  --   --   --   --   --  <0.10* 0.21* 0.35  ?CREATININE 0.67  --   --   --   --   --   --   --   ?CKTOTAL  --   --   --  169  --   --   --   --   ?TROPONINIHS 65* 80*  --  65* 64*  --   --   --   ? ? ? ?Estimated Creatinine Clearance: 94.1 mL/min (by C-G formula based on SCr of 0.67 mg/dL). ? ? ?Medical History: ?Past Medical History:  ?Diagnosis Date  ? Diabetes mellitus without complication ([place])   ? Stroke [facility]) 01/2018  ? Per Brother   ? ? ?Medications:  ?Per chart review, pt not on anticoagulation prior to admission ? ?Assessment: ?61 y.o. male with a past medical history of diabetes, prior CVA with weakness who presented to the ED for generalized weakness. Pharmacy consulted for heparin dosing/monitoring.  ? ?4/2 0701 HL <0.10 ?4/2 1456 HL 0.21 ?4/2 2207 HL 0.35, therapeutic X 1  ? ?Goal of Therapy:  ?Heparin level 0.3-0.7 units/ml ?Monitor platelets by anticoagulation protocol: Yes ?  ?Plan:  ?4/2: HL @ 2207 = 0.35, therapeutic X 1 ?Will continue pt on current rate and recheck HL on 4/3 @ 0400.  ? ?Tomie Elko D, PharmD ?04/24/2021,11:16 PM ? ? ?
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
## 2021-04-24 NOTE — Progress Notes (Signed)
PT Cancellation Note ? ?Patient Details ?Name: [name] ?MRN: [id number] ?DOB: 03/13/1960 ? ? ?Cancelled Treatment:    Reason Eval/Treat Not Completed: Other (comment);Patient not medically ready PT orders received, chart reviewed. Pt noted to have new PE & heparin drip initiated on 04/23/21 at 2037. Per protocol, will hold PT evaluation for 48 hours unless MD requests PT mobilize pt earlier (MD requests to hold PT evaluation today). ? ?Lavone Nian, PT, DPT ?04/24/21, 12:54 PM ? ? ?Waunita Schooner ?04/24/2021, 12:52 PM ?

## 2021-04-24 NOTE — ED Notes (Signed)
Returned from MRI at this time. Patient transported to X-ray ?

## 2021-04-24 NOTE — ED Notes (Signed)
Patient appears to be sleeping. Eyes closed, respirations even and unlabored. NAD noted.  ?

## 2021-04-24 NOTE — Progress Notes (Addendum)
ANTICOAGULATION CONSULT NOTE ? ?Pharmacy Consult for heparin infusion ?Indication: chest pain/ACS ? ?No Known Allergies ? ?Patient Measurements: ?Height: 5\' 7"  (170.2 cm) ?Weight: 74.8 kg (165 lb) ?IBW/kg (Calculated) : 66.1 ?Heparin Dosing Weight: 74.8 kg ? ?Vital Signs: ?BP: 127/80 (04/02 0800) ?Pulse Rate: 74 (04/02 0800) ? ?Labs: ?Recent Labs  ?  04/23/21 ?1657 04/23/21 ?1903 04/23/21 ?2037 04/24/21 ?6606 04/24/21 ?0308 04/24/21 ?0701  ?HGB 14.2  --   --   --  14.4  --   ?HCT 43.0  --   --   --  44.9  --   ?PLT 161  --   --   --  163  --   ?APTT  --   --  35  --   --   --   ?HEPARINUNFRC  --   --   --   --   --  <0.10*  ?CREATININE 0.67  --   --   --   --   --   ?CKTOTAL  --   --   --  169  --   --   ?TROPONINIHS 65* 80*  --  65* 64*  --   ? ? ? ?Estimated Creatinine Clearance: 94.1 mL/min (by C-G formula based on SCr of 0.67 mg/dL). ? ? ?Medical History: ?Past Medical History:  ?Diagnosis Date  ? Diabetes mellitus without complication ([place])   ? Stroke [facility]) 01/2018  ? Per Brother   ? ? ?Medications:  ?Per chart review, pt not on anticoagulation prior to admission ? ?Assessment: ?61 y.o. male with a past medical history of diabetes, prior CVA with weakness who presented to the ED for generalized weakness. Pharmacy consulted for heparin dosing/monitoring.  ? ?4/2 0701 < 0.1 ? ?Goal of Therapy:  ?Heparin level 0.3-0.7 units/ml ?Monitor platelets by anticoagulation protocol: Yes ?  ?Plan:  ?Heparin level is subtherapeutic. Due to concern for CVA/TIA will not bolus and only increase infusion to 1050 units/hr. Recheck heparin level in 6 hours. CBC daily while on heparin.   ? ?Oswald Hillock, PharmD,  ?04/24/2021,8:19 AM ? ? ?

## 2021-04-24 NOTE — ED Notes (Signed)
This tech helped pt eat lunch meal. Pt ate 90% of it. Tray was discarded after pt was done. This tech washed pt's face with wash cloth and turned tv on for pt. Pt has no other needs at this time.  ?

## 2021-04-24 NOTE — ED Notes (Signed)
Breakfast tray at bedside 

## 2021-04-24 NOTE — Progress Notes (Signed)
OT Cancellation Note ? ?Patient Details ?Name: [name] ?MRN: [id number] ?DOB: 11/27/1960 ? ? ?Cancelled Treatment:    Reason Eval/Treat Not Completed: Patient not medically ready. Pt noted to have new PE & heparin drip initiated on 04/23/21 at 2037. Per protocol, will hold OT evaluation for 48 hours. MD aware. ? ?Fredirick Maudlin, OTR/L ?[place] ? ?

## 2021-04-24 NOTE — Progress Notes (Signed)
ANTICOAGULATION CONSULT NOTE ? ?Pharmacy Consult for heparin infusion ?Indication: chest pain/ACS ? ?No Known Allergies ? ?Patient Measurements: ?Height: 5\' 7"  (170.2 cm) ?Weight: 74.8 kg (165 lb) ?IBW/kg (Calculated) : 66.1 ?Heparin Dosing Weight: 74.8 kg ? ?Vital Signs: ?Temp: 98.9 ?F (37.2 ?C) (04/01 1656) ?Temp Source: Oral (04/01 1656) ?BP: 158/94 (04/02 0230) ?Pulse Rate: 76 (04/02 0200) ? ?Labs: ?Recent Labs  ?  04/23/21 ?1657 04/23/21 ?1903 04/23/21 ?2037 04/24/21 ?1884  ?HGB 14.2  --   --   --   ?HCT 43.0  --   --   --   ?PLT 161  --   --   --   ?APTT  --   --  35  --   ?CREATININE 0.67  --   --   --   ?CKTOTAL  --   --   --  169  ?TROPONINIHS 65* 80*  --  65*  ? ? ? ?Estimated Creatinine Clearance: 94.1 mL/min (by C-G formula based on SCr of 0.67 mg/dL). ? ? ?Medical History: ?Past Medical History:  ?Diagnosis Date  ? Diabetes mellitus without complication ([place])   ? Stroke [facility]) 01/2018  ? Per Brother   ? ? ?Medications:  ?Per chart review, pt not on anticoagulation prior to admission ? ?Assessment: ?61 y.o. male with a past medical history of diabetes, prior CVA with weakness who presented to the ED for generalized weakness. Pharmacy consulted for heparin dosing/monitoring.  ? ?Goal of Therapy:  ?Heparin level 0.3-0.7 units/ml ?Monitor platelets by anticoagulation protocol: Yes ?  ?Plan:  ?Give 4000 units bolus x 1 ?Start heparin infusion at 850 units/hr ?Check anti-Xa level in 6 hours and daily while on heparin ?Continue to monitor H&H and platelets ? ?  - Heparin drip was paused @ ~ 2300 for MRI but restarted on 4/2 @ ~ 0100.  Will reschedule HL for 0700.  ? ?Tylin Stradley D ?04/24/2021,3:04 AM ? ? ?
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
## 2021-04-24 NOTE — Progress Notes (Addendum)
[facility] Progress Note ? ?Patient: [name]    MRN:[id number]  DOA: 04/23/2021    ? ?Date of Service: the patient was seen and examined on 04/24/2021 ? ?Chief Complaint  ?Patient presents with  ? Weakness  ? ?Brief hospital course: ? Labron Pavelko is a 61 y.o. male with medical history significant of hypertension, hyperlipidemia, diabetes mellitus, CVA 2020, osteomyelitis s/p  right 2nd, great toe amputation who presents to ed BIB EMS with complaints of generalized weakness for the las 2-3 days. Per chart when EMS arrived patient was laying in urine soaked clothing.  Patient states he feels very fatigued but  has no n/v/d/abdominal pain/ dysuria/ f/c/chest pain or shortness of breath or uri sxs. ?  ?ED Course:  ?Vitals: ?Afeb, 98.9,  bp 164/91 hr 77, rr 18 sat 98%  ?Ekg:sinus rhythm  borderline st changes  ?Trop 1hr 65 2 hour80  ?Labs: ?134, K 3.4, glu 146 ?Lactic 1.2  ?Wbc 6.7,hgb 14.2 plt 161  ?CT head ?1. New subacute to chronic right cerebellar hemisphere infarct. ?2. Chronic small vessel ischemic disease and brain atrophy ? ? ?Assessment and Plan: ? ?Acute pulmonary embolism ?Patient presented with debility and failure to thrive for past 3 days ?no infectious etiology found ?Subacute cva  noted on CT new from prior CT  ?MRI brain: No acute intracranial abnormality. 2. Chronic microvascular ischemic disease, with multiple remote lacunar infarcts involving the deep gray nuclei, pons, and cerebellum. ?Underlying age advanced atrophy, most pronounced at the brainstem, Diffuse ventricular prominence, similar to previous. While this finding may be related to underlying atrophy, a degree of NPH could contributory, and could be considered in the correct clinical setting. ?Absent flow void within the left V4 segment, likely occluded, stable from previous. ?CTA chest : pulmonary embolism in the distal right pulmonary artery, as well as in the proximal right upper middle and lower lobe pulmonary artery  branches. No evidence of right heart strain. ?D-dimer 1.05 slightly elevated ?Troponin slightly elevated, EKG no significant changes ?LDL 33, procalcitonin negative, CK1 6 9 within normal range, lactic acid 1.2, ESR 27 ?Patient was started on heparin IV infusion, will transition to DOAC when stable ?  ? ?TIA/CVA ?-hx of CVA 2020 ?- place on cva /tia protocol  ?- MRI negative for acute CVA.  Complete report as above ?echo, pending  ?- SLP/OT/PT/neuro checks  ?- continue on ASA,plavix , statin  ?-neurology consulted, recommended DOAC from neuro perspective. ?  ?  ?Elevated troponin, type II MI due to underlying CAD  ?ACS ruled out, no further intervention as per cardiology.   ?-borderline st changes  ?- no sob/chest pain or cardiac complaints  ?- f/u echo  ?-cardiology consulted ?Patient takes aspirin and Plavix at home and now patient will need DOAC, so we need to drop one agent.  We will follow cardiology for further recommendation. ? ? ?Hypertension ?Continue amlodipine home dose ?We will continue monitor BP and titrate medications accordingly ? ? Hyperlipidemia ?-continue Lipitor 20 mg p.o. daily ?-LDL 33 below <70  ?  ?  ?Diabetes mellitus ?- place no finger sticks , iss  ?-hold metformin  ?  ?Hx of Osteomyelitis  ?-s/p right 1 st and 2nd toe amputation ? ? ?Body mass index is 25.84 kg/m?.  ?Interventions: ?  ?Follow PT and OT eval for disposition plan ?   ? ?Diet: Heart healthy and carb modified ?DVT Prophylaxis: Therapeutic Anticoagulation with heparin IV infusion   ? ?Advance goals of care discussion: Full code ? ?  Family Communication: family was NOT present at bedside, at the time of interview.  ?The pt provided permission to discuss medical plan with the family. Opportunity was given to ask question and all questions were answered satisfactorily.  ? ?Disposition:  ?Pt is from home, admitted with AMS, found to have pulmonary embolism on IV heparin infusion, still has AMS, which precludes a safe  discharge. ?Discharge to home versus SNF TBD after PT and OT eval, when clinically stable. ? ?Subjective:  ? ?Physical Exam: ?General:  lethargic oriented to place and person.  ?Appear in no distress, affect flat in affect ?Eyes: PERRLA ?ENT: Oral Mucosa Clear, moist  ?Neck: no JVD,  ?Cardiovascular: S1 and S2 Present, no Murmur,  ?Respiratory: good respiratory effort, Bilateral Air entry equal and Decreased, no Crackles, no wheezes ?Abdomen: Bowel Sound present, Soft and no tenderness,  ?Skin: no rashes ?Extremities: no Pedal edema, no calf tenderness, s/p Right 1st and 2nd Toe amputation due to h/o OM, dry skin ?Neurologic: without any new focal findings ?Gait not checked due to patient safety concerns ? ?Vitals:  ? 04/24/21 0800 04/24/21 0900 04/24/21 1100 04/24/21 1200  ?BP: 127/80 (!) 149/84 (!) 141/86 (!) 142/92  ?Pulse: 74 73 67 79  ?Resp: _0 ?Temp:      ?TempSrc:      ?SpO2: 94% 96% 97% 94%  ?Weight:      ?Height:      ? ? ?Intake/Output Summary (Last 24 hours) at 04/24/2021 1409 ?Last data filed at 04/24/2021 1102 ?Gross per 24 hour  ?Intake 667.55 ml  ?Output --  ?Net 667.55 ml  ? ?Filed Weights  ? 04/23/21 2011  ?Weight: 74.8 kg  ? ? ?Data Reviewed: ?I have personally reviewed and interpreted daily labs, tele strips, imagings as discussed above. ?I reviewed all nursing notes, pharmacy notes, vitals, pertinent old records ?I have discussed plan of care as described above with RN and patient/family. ? ?CBC: ?Recent Labs  ?Lab 04/23/21 ?1657 04/24/21 ?0308  ?WBC 6.7 7.0  ?HGB 14.2 14.4  ?HCT 43.0 44.9  ?MCV 87.9 89.4  ?PLT 161 163  ? ?Basic Metabolic Panel: ?Recent Labs  ?Lab 04/23/21 ?1657 04/24/21 ?6546  ?NA 134*  --   ?K 3.4*  --   ?CL 99  --   ?CO2 26  --   ?GLUCOSE 146*  --   ?BUN 19  --   ?CREATININE 0.67  --   ?CALCIUM 8.7*  --   ?MG  --  2.0  ? ? ?Studies: ?DG Chest 2 View ? ?Result Date: 04/24/2021 ?CLINICAL DATA:  Weakness EXAM: CHEST - 2 VIEW COMPARISON:  09/15/2018 FINDINGS: The heart size  and mediastinal contours are within normal limits. Both lungs are clear. The visualized skeletal structures are unremarkable. IMPRESSION: No active cardiopulmonary disease. Electronically Signed   By: Rolm Baptise M.D.   On: 04/24/2021 00:52  ? ?CT HEAD WO CONTRAST (5MM) ? ?Result Date: 04/23/2021 ?CLINICAL DATA:  Mental status change.  Weakness for 3 days. EXAM: CT HEAD WITHOUT CONTRAST TECHNIQUE: Contiguous axial images were obtained from the base of the skull through the vertex without intravenous contrast. RADIATION DOSE REDUCTION: This exam was performed according to the departmental dose-optimization program which includes automated exposure control, adjustment of the mA and/or kV according to patient size and/or use of iterative reconstruction technique. COMPARISON:  09/14/2018 FINDINGS: Brain: No evidence of acute infarction, hemorrhage, hydrocephalus, extra-axial collection or mass lesion/mass effect. New asymmetric area of low attenuation within the  right cerebral hemisphere compatible with subacute to chronic infarct, image 9/2. There is mild diffuse low-attenuation within the subcortical and periventricular white matter compatible with chronic microvascular disease. Prominence of the sulci and ventricles compatible with brain atrophy. Vascular: No hyperdense vessel or unexpected calcification. Skull: Normal. Negative for fracture or focal lesion. Sinuses/Orbits: No acute finding. Other: None IMPRESSION: 1. No acute intracranial abnormalities. 2. Chronic small vessel ischemic disease and brain atrophy 3. New subacute to chronic right cerebellar hemisphere infarct. Electronically Signed   By: Taylor  Stroud M.D.   On: 04/23/2021 17:29  ? ?CT Angio Chest Pulmonary Embolism (PE) W or WO Contrast ? ?Result Date: 04/24/2021 ?CLINICAL DATA:  Patient from home complains of weakness for 3 days. EXAM: CT ANGIOGRAPHY CHEST WITH CONTRAST TECHNIQUE: Multidetector CT imaging of the chest was performed using the standard  protocol during bolus administration of intravenous contrast. Multiplanar CT image reconstructions and MIPs were obtained to evaluate the vascular anatomy. RADIATION DOSE REDUCTION: This exam was performed according to the d

## 2021-04-24 NOTE — Consult Note (Signed)
Neurology Consultation ?Reason for Consult: Weakness ?Referring Physician: Dwyane Dee, D ? ?CC: Weakness ? ?History is obtained from:Patient ? ?HPI: [name] is a 61 y.o. male with a history of multiple previous strokes and diabetes who presents with generalized weakness that has been going on for a few days.  Neurology was consulted for this generalized weakness.  MRI has been performed which is negative for acute stroke.  In evaluation of chest pain/shortness of breath he had a CTA performed which demonstrates pulmonary embolus. ? ?At baseline, he apparently has some degree of aphasia and has a fairly high ischemic disease burden. ? ?ROS: A 14 point ROS was performed and is negative except as noted in the HPI.  ?Past Medical History:  ?Diagnosis Date  ? Diabetes mellitus without complication ([place])   ? Stroke [facility]) 01/2018  ? Per Brother   ? ? ? ?Family History  ?Problem Relation Age of Onset  ? Diabetes Brother   ? ? ? ?Social History:  reports that he has been smoking cigarettes. He has never used smokeless tobacco. He reports that he does not drink alcohol and does not use drugs. ? ? ?Exam: ?Current vital signs: ?BP 137/76 (BP Location: Right Arm)   Pulse 68   Temp 98.7 ?F (37.1 ?C)   Resp 17   Ht 5\' 7"  (1.702 m)   Wt 72.1 kg   SpO2 98%   BMI 24.89 kg/m?  ?Vital signs in last 24 hours: ?Temp:  [98.7 ?F (37.1 ?C)-98.9 ?F (37.2 ?C)] 98.7 ?F (37.1 ?C) (04/02 1415) ?Pulse Rate:  [67-81] 68 (04/02 1415) ?Resp:  [11-25] 17 (04/02 1415) ?BP: (127-174)/(76-140) 137/76 (04/02 1415) ?SpO2:  [93 %-100 %] 98 % (04/02 1415) ?Weight:  [72.1 kg-74.8 kg] 72.1 kg (04/02 1415) ? ? ?Physical Exam  ?Constitutional: Appears well-developed and well-nourished.  ?Psych: Affect appropriate to situation ?Eyes: No scleral injection ?HENT: No OP obstruction ?MSK: no joint deformities.  ?Cardiovascular: Normal rate and regular rhythm.  ?Respiratory: Effort normal, non-labored breathing ?GI: Soft.  No distension. There is no  tenderness.  ?Skin: WDI ? ?Neuro: ?Mental Status: ?Patient is awake, alert, he gives the year is 24, unable to give the month.  Gives president as [name]. ?Cranial Nerves: ?II: Visual Fields are full. Pupils are equal, round, and reactive to light.   ?III,IV, VI: EOMI without ptosis or diploplia.  ?V: Facial sensation is symmetric to temperature ?VII: Facial movement is symmetric.  ?VIII: hearing is intact to voice ?X: Uvula elevates symmetrically ?XI: Shoulder shrug is symmetric. ?XII: tongue is midline without atrophy or fasciculations.  ?Motor: ?Tone is normal. Bulk is normal. 5/5 strength was present in all four extremities.  ?Sensory: ?Sensation is symmetric to light touch and temperature in the arms and legs. ?Cerebellar: ?No clear ataxia on finger-nose-finger ? ? ? ? ?I have reviewed labs in epic and the results pertinent to this consultation are: ?Creatinine 0.67 ? ?I have reviewed the images obtained: MRI brain-significant atrophy out of proportion to his age, he has large ventricles which appear to me to be ex vacuo. ? ?Impression: 61 year old male with previous strokes who presents with generalized weakness and is found to have pulmonary embolus.  I suspect that this is the cause of his generalized weakness.  He does have some disorientation, and I strongly suspect based on his MRI that he has some degree of underlying dementia. ? ?Radiology raised the possibility of a contribution from NPH, but the ventriculomegaly is chronic and I do not think  in the setting of an acute stressor is the time to further this evaluation. ? ?While he is anticoagulated, there is no need for concurrent antiplatelet therapy from my perspective, but would resume this once he is done with anticoagulation. ? ?Recommendations: ?1) can hold antiplatelets while anticoagulated ?2) neurology will be available on an as-needed basis. ? ? ?Gregory Rack, MD ?[facility] ?[phone] ? ?If 7pm- 7am, please page  neurology on call as listed in [place]. ? ?

## 2021-04-24 NOTE — ED Notes (Signed)
Speech therapy at bedside.

## 2021-04-25 ENCOUNTER — Inpatient Hospital Stay: Admit: 2021-04-25 | Payer: Medicare Other

## 2021-04-25 ENCOUNTER — Inpatient Hospital Stay: Payer: Medicare Other

## 2021-04-25 DIAGNOSIS — R778 Other specified abnormalities of plasma proteins: Secondary | ICD-10-CM | POA: Diagnosis not present

## 2021-04-25 DIAGNOSIS — R7989 Other specified abnormal findings of blood chemistry: Secondary | ICD-10-CM | POA: Diagnosis present

## 2021-04-25 DIAGNOSIS — I2699 Other pulmonary embolism without acute cor pulmonale: Secondary | ICD-10-CM | POA: Diagnosis not present

## 2021-04-25 LAB — BASIC METABOLIC PANEL
Anion gap: 7 (ref 5–15)
BUN: 20 mg/dL (ref 6–20)
CO2: 26 mmol/L (ref 22–32)
Calcium: 8.5 mg/dL — ABNORMAL LOW (ref 8.9–10.3)
Chloride: 101 mmol/L (ref 98–111)
Creatinine, Ser: 0.86 mg/dL (ref 0.61–1.24)
GFR, Estimated: >60 mL/min (ref >60)
Glucose, Bld: 67 mg/dL — ABNORMAL LOW (ref 70–99)
Potassium: 3.5 mmol/L (ref 3.5–5.1)
Sodium: 134 mmol/L — ABNORMAL LOW (ref 135–145)

## 2021-04-25 LAB — MAGNESIUM: Magnesium: 2 mg/dL (ref 1.7–2.4)

## 2021-04-25 LAB — CBC
HCT: 41.6 % (ref 39.0–52.0)
Hemoglobin: 13.8 g/dL (ref 13.0–17.0)
MCH: 29.1 pg (ref 26.0–34.0)
MCHC: 33.2 g/dL (ref 30.0–36.0)
MCV: 87.8 fL (ref 80.0–100.0)
Platelets: 182 10*3/uL (ref 150–400)
RBC: 4.74 MIL/uL (ref 4.22–5.81)
RDW: 12.1 % (ref 11.5–15.5)
WBC: 5.5 10*3/uL (ref 4.0–10.5)
nRBC: 0 % (ref 0.0–0.2)

## 2021-04-25 LAB — GLUCOSE, CAPILLARY
Glucose-Capillary: 143 mg/dL — ABNORMAL HIGH (ref 70–99)
Glucose-Capillary: 57 mg/dL — ABNORMAL LOW (ref 70–99)
Glucose-Capillary: 235 mg/dL — ABNORMAL HIGH (ref 70–99)
Glucose-Capillary: 137 mg/dL — ABNORMAL HIGH (ref 70–99)
Glucose-Capillary: 195 mg/dL — ABNORMAL HIGH (ref 70–99)

## 2021-04-25 LAB — PHOSPHORUS: Phosphorus: 3.6 mg/dL (ref 2.5–4.6)

## 2021-04-25 LAB — HEPARIN LEVEL (UNFRACTIONATED): Heparin Unfractionated: 0.4 IU/mL (ref 0.30–0.70)

## 2021-04-25 MED ORDER — APIXABAN 5 MG PO TABS
5.0000 mg | ORAL_TABLET | Freq: Two times a day (BID) | ORAL | Status: DC
  Administered 2021-05-02: 5 mg via ORAL
  Filled 2021-04-25: qty 1

## 2021-04-25 MED ORDER — HEPARIN (PORCINE) 25000 UT/250ML-% IV SOLN
1200.0000 [IU]/h | INTRAVENOUS | Status: AC
  Administered 2021-04-25: 1200 [IU]/h via INTRAVENOUS
  Filled 2021-04-25: qty 250

## 2021-04-25 MED ORDER — APIXABAN 5 MG PO TABS
10.0000 mg | ORAL_TABLET | Freq: Two times a day (BID) | ORAL | Status: AC
  Administered 2021-04-25 – 2021-05-02 (×13): 10 mg via ORAL
  Filled 2021-04-25 (×13): qty 2

## 2021-04-25 MED ORDER — POTASSIUM CHLORIDE CRYS ER 20 MEQ PO TBCR
40.0000 meq | EXTENDED_RELEASE_TABLET | Freq: Once | ORAL | Status: AC
  Administered 2021-04-25: 40 meq via ORAL
  Filled 2021-04-25: qty 2

## 2021-04-25 NOTE — Progress Notes (Signed)
OT Cancellation Note ? ?Patient Details ?Name: [name] ?MRN: [id number] ?DOB: [birth year] ? ? ?Cancelled Treatment:    Reason Eval/Treat Not Completed: Patient not medically ready. Patient with new PE, still on 48 hour hold per policy. Will evaluate when patient is medically appropriate.  ? ?Ardeth Perfect., MPH, MS, OTR/L ?ascom [phone] ?04/25/21, 10:49 AM ? ?

## 2021-04-25 NOTE — Progress Notes (Signed)
ANTICOAGULATION CONSULT NOTE ? ?Pharmacy Consult for heparin infusion ?Indication: chest pain/ACS ? ?No Known Allergies ? ?Patient Measurements: ?Height: 5\' 7"  (170.2 cm) ?Weight: 72.1 kg (158 lb 14.4 oz) ?IBW/kg (Calculated) : 66.1 ?Heparin Dosing Weight: 74.8 kg ? ?Vital Signs: ?Temp: 98.5 ?F (36.9 ?C) (04/03 0427) ?BP: 139/77 (04/03 0427) ?Pulse Rate: 68 (04/03 0427) ? ?Labs: ?Recent Labs  ?  04/23/21 ?1657 04/23/21 ?1903 04/23/21 ?2037 04/24/21 ?0109 04/24/21 ?0308 04/24/21 ?0701 04/24/21 ?1456 04/24/21 ?2207 04/25/21 ?3235  ?HGB 14.2  --   --   --  14.4  --   --   --  13.8  ?HCT 43.0  --   --   --  44.9  --   --   --  41.6  ?PLT 161  --   --   --  163  --   --   --  182  ?APTT  --   --  35  --   --   --   --   --   --   ?HEPARINUNFRC  --   --   --   --   --    < > 0.21* 0.35 0.40  ?CREATININE 0.67  --   --   --   --   --   --   --  0.86  ?CKTOTAL  --   --   --  169  --   --   --   --   --   ?TROPONINIHS 65* 80*  --  65* 64*  --   --   --   --   ? < > = values in this interval not displayed.  ? ? ? ?Estimated Creatinine Clearance: 87.5 mL/min (by C-G formula based on SCr of 0.86 mg/dL). ? ? ?Medical History: ?Past Medical History:  ?Diagnosis Date  ? Diabetes mellitus without complication ([place])   ? Stroke [facility]) 01/2018  ? Per Brother   ? ? ?Medications:  ?Per chart review, pt not on anticoagulation prior to admission ? ?Assessment: ?61 y.o. male with a past medical history of diabetes, prior CVA with weakness who presented to the ED for generalized weakness. Pharmacy consulted for heparin dosing/monitoring.  ? ?4/2 0701 HL <0.10 ?4/2 1456 HL 0.21 ?4/2 2207 HL 0.35, therapeutic X 1  ?4/3 0353 HL 0.40, therapeutic X 2 ? ?Goal of Therapy:  ?Heparin level 0.3-0.7 units/ml ?Monitor platelets by anticoagulation protocol: Yes ?  ?Plan:  ?4/3:  HL @ 0353 = 0.40, therapeutic X 2  ?Will continue pt on current rate and recheck HL on 4/04 with AM labs.  ? ?Lexie Morini D, PharmD ?04/25/2021,5:24 AM ? ? ?

## 2021-04-25 NOTE — Progress Notes (Signed)
Inpatient Diabetes Program Recommendations ? ?AACE/[name]: New Consensus Statement on Inpatient Glycemic Control  ? ?Target Ranges:  Prepandial:   less than 140 mg/dL ?     Peak postprandial:   less than 180 mg/dL (1-2 hours) ?     Critically ill patients:  140 - 180 mg/dL  ? ? Latest Reference Range & Units 04/23/21 16:57 04/25/21 03:53  ?Glucose 70 - 99 mg/dL 146 (H) 67 (L)  ? ?Review of Glycemic Control ? ?Diabetes history: DM2 ?Outpatient Diabetes medications: Metformin 1000 mg BID ?Current orders for Inpatient glycemic control: None ? ?Inpatient Diabetes Program Recommendations:   ? ?Insulin: Please consider ordering CBGs with Novolog 0-6 units TID with meals. ? ?Thanks, ?Barnie Alderman, RN, MSN, CDE ?Diabetes Coordinator ?Inpatient Diabetes Program ?[phone] (Team Pager from 8am to 5pm) ? ? ? ?

## 2021-04-25 NOTE — Progress Notes (Signed)
PT Cancellation Note ? ?Patient Details ?Name: [name] ?MRN: [id number] ?DOB: 12/15/1960 ? ? ?Cancelled Treatment:    Reason Eval/Treat Not Completed: Patient not medically ready. Patient with new PE, 48 hour hold per policy. Will evaluate when patient is medically appropriate.  ? ? ?[name] ?04/25/2021, 10:09 AM ?

## 2021-04-25 NOTE — Progress Notes (Signed)
Speech Language Pathology Treatment:    ?Patient Details ?Name: [name] ?MRN: [id number] ?DOB: 11-29-1960 ?Today's Date: 04/25/2021 ?Time: [phone] ?SLP Time Calculation (min) (ACUTE ONLY): 20 min ? ?Assessment / Plan / Recommendation ?Clinical Impression ? Pt seen for skilled SLP services for further cognitive/language evaluation. Pt alert and cooperative.  ? ?Assessment completed via informal means and selected subtests of Cognistat. Marked improvement in pt's participation in skilled SLP services this date. Based on today's re-evaluation, pt presents with cognitive-linguistic deficits functional complex auditory comprehension, complex verbal expression, immediate and short term memory, temporal and situational orientation, verbal problem solving, and abstract reasoning. Pt orientated to self and place, not to time or situation (despite cueing). Pt able to follow 1-step and 2-step commands with extra time. Mild difficulty answering complex yes/no questions accurately. Pt with intact repetition, confrontation naming, and social automatic speech. Pt speech is without s/sx dysarthria. No obvious anomia; however, reduced verbal output/length of utterance appreciated at times. Pt with moderate difficulty with immediate recall (digit span forward) and severe difficulty with short term recall (4 word delayed recall; Memory subtest of Cognistat). Additionally, pt with moderate difficulty with abstract reasoning and problem solving (as per Reasoning and Problem Solving subtests of Cognistat). ?baseline level of cognitive-linguistic functioning given hx of stroke with aphasia in 2020 and level of assistance needed PTA. ?  ?Recommend post-acute SLP services at address functional cognitive-linguistic functioning in home/functional setting. Anticipate need for frequent/constant supervision and assistance with IADLs at d/c.  ? ?Pt and RN made aware of results, recommendations, and SLP POC. Pt verbalized  understanding/agreement; however, will likely benefit from reinforcement of content given cognitive-linguistic deficits.  ?   ?HPI HPI: Per 17 H&P "[name] is a 61 y.o. male with medical history significant of hypertension, hyperlipidemia, diabetes mellitus, CVA 2020, osteomyelitis s/p  right 2nd, great toe amputation who presents to ed BIB EMS with complaints of generalized weakness for the las 2-3 days. Per chart when EMS arrived patient was laying in urine soaked clothing.  Patient states he feels very fatigued but  has no n/v/d/abdominal pain/ dysuria/ f/c/chest pain or shortness of breath or uri sxs." Head CT, 04/23/21 "1. No acute intracranial abnormalities.  2. Chronic small vessel ischemic disease and brain atrophy  3. New subacute to chronic right cerebellar hemisphere infarct." MRI Brain, 04/24/21, "1. No acute intracranial abnormality.  2. Chronic microvascular ischemic disease, with multiple remote  lacunar infarcts involving the deep gray nuclei, pons, and  cerebellum.  3. Underlying age advanced atrophy, most pronounced at the brainstem  4. Diffuse ventricular prominence, similar to previous. While this  finding may be related to underlying atrophy, a degree of NPH could  contributory, and could be considered in the correct clinical  setting.  5. Absent flow void within the left V4 segment, likely occluded,  stable from previous." ?  ?   ?SLP Plan ? Discharge SLP treatment due to (comment) (recommendation for skilled SLP services at next level of care) ? ?  ?  ?Recommendations for follow up therapy are one component of a multi-disciplinary discharge planning process, led by the attending physician.  Recommendations may be updated based on patient status, additional functional criteria and insurance authorization. ?  ? ?Recommendations   ? ? ? ? 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 ? ? ? ?  ?  ? ?Cherrie Gauze, M.S., CCC-SLP ?Speech-Language Pathologist ?[facility] ?[phone] ([place])  ? ?Quintella Baton ? ?04/25/2021, 10:52 AM ?

## 2021-04-25 NOTE — Progress Notes (Signed)
? ?Progress Note ? ?Patient Name: [name] ?Date of Encounter: 04/25/2021 ? ?[facility] Cardiologist: None  ? ?Subjective  ? ?Patient is confused during interview. Denies chest pain.  ? ?Inpatient Medications  ?  ?Scheduled Meds: ?  stroke: mapping our early stages of recovery book   Does not apply Once  ? amLODipine  2.5 mg Oral Daily  ? aspirin EC  81 mg Oral Daily  ? atorvastatin  20 mg Oral q1800  ? clopidogrel  75 mg Oral Q breakfast  ? levETIRAcetam  500 mg Oral BID  ? ?Continuous Infusions: ? heparin 1,200 Units/hr (04/25/21 0455)  ? ?PRN Meds: ?acetaminophen **OR** acetaminophen (TYLENOL) oral liquid 160 mg/5 mL **OR** acetaminophen, senna-docusate  ? ?Vital Signs  ?  ?Vitals:  ? 04/24/21 1933 04/24/21 2307 04/25/21 0427 04/25/21 0744  ?BP: 137/75 139/88 139/77 131/88  ?Pulse: 67 70 68 72  ?Resp:  20 18 16   ?Temp: 98.6 ?F (37 ?C) 98.6 ?F (37 ?C) 98.5 ?F (36.9 ?C) 98.3 ?F (36.8 ?C)  ?TempSrc:      ?SpO2: 100% 100% 100% 100%  ?Weight:      ?Height:      ? ? ?Intake/Output Summary (Last 24 hours) at 04/25/2021 0746 ?Last data filed at 04/25/2021 0455 ?Gross per 24 hour  ?Intake 813.44 ml  ?Output --  ?Net 813.44 ml  ? ? ?  04/24/2021  ?  2:15 PM 04/23/2021  ?  8:11 PM 07/12/2019  ?  1:08 PM  ?Last 3 Weights  ?Weight (lbs) 158 lb 14.4 oz 165 lb 155 lb  ?Weight (kg) 72.077 kg 74.844 kg 70.308 kg  ?   ? ?Telemetry  ?  ?NSR, HR 60s - Personally Reviewed ? ?ECG  ?  ?No new - Personally Reviewed ? ?Physical Exam  ? ?GEN: No acute distress.   ?Neck: No JVD ?Cardiac: RRR, + murmur, no rubs, or gallops.  ?Respiratory: Clear to auscultation bilaterally. ?GI: Soft, nontender, non-distended  ?MS: No edema; No deformity. ?Neuro:  Nonfocal  ?Psych: Normal affect  ? ?Labs  ?  ?High Sensitivity Troponin:   ?Recent Labs  ?Lab 04/23/21 ?1657 04/23/21 ?1903 04/24/21 ?9379 04/24/21 ?0308  ?TROPONINIHS 65* 80* 65* 64*  ?   ?Chemistry ?Recent Labs  ?Lab 04/23/21 ?1657 04/24/21 ?0240 04/25/21 ?0353  ?NA 134*  --  134*  ?K 3.4*  --   3.5  ?CL 99  --  101  ?CO2 26  --  26  ?GLUCOSE 146*  --  67*  ?BUN 19  --  20  ?CREATININE 0.67  --  0.86  ?CALCIUM 8.7*  --  8.5*  ?MG  --  2.0 2.0  ?GFRNONAA >60  --  >60  ?ANIONGAP 9  --  7  ?  ?Lipids  ?Recent Labs  ?Lab 04/24/21 ?9735  ?CHOL 82  ?TRIG 70  ?HDL 35*  ?[place] 33  ?CHOLHDL 2.3  ?  ?Hematology ?Recent Labs  ?Lab 04/23/21 ?1657 04/24/21 ?0308 04/25/21 ?3299  ?WBC 6.7 7.0 5.5  ?RBC 4.89 5.02 4.74  ?HGB 14.2 14.4 13.8  ?HCT 43.0 44.9 41.6  ?MCV 87.9 89.4 87.8  ?MCH 29.0 28.7 29.1  ?MCHC 33.0 32.1 33.2  ?RDW 12.4 12.5 12.1  ?PLT 161 163 182  ? ?Thyroid No results for input(s): TSH, FREET4 in the last 168 hours.  ?BNPNo results for input(s): BNP, PROBNP in the last 168 hours.  ?DDimer  ?Recent Labs  ?Lab 04/24/21 ?0701  ?DDIMER 1.05*  ?  ? ?Radiology  ?  ?  DG Chest 2 View ? ?Result Date: 04/24/2021 ?CLINICAL DATA:  Weakness EXAM: CHEST - 2 VIEW COMPARISON:  09/15/2018 FINDINGS: The heart size and mediastinal contours are within normal limits. Both lungs are clear. The visualized skeletal structures are unremarkable. IMPRESSION: No active cardiopulmonary disease. Electronically Signed   By: Rolm Baptise M.D.   On: 04/24/2021 00:52  ? ?CT HEAD WO CONTRAST (5MM) ? ?Result Date: 04/23/2021 ?CLINICAL DATA:  Mental status change.  Weakness for 3 days. EXAM: CT HEAD WITHOUT CONTRAST TECHNIQUE: Contiguous axial images were obtained from the base of the skull through the vertex without intravenous contrast. RADIATION DOSE REDUCTION: This exam was performed according to the departmental dose-optimization program which includes automated exposure control, adjustment of the mA and/or kV according to patient size and/or use of iterative reconstruction technique. COMPARISON:  09/14/2018 FINDINGS: Brain: No evidence of acute infarction, hemorrhage, hydrocephalus, extra-axial collection or mass lesion/mass effect. New asymmetric area of low attenuation within the right cerebral hemisphere compatible with subacute to  chronic infarct, image 9/2. There is mild diffuse low-attenuation within the subcortical and periventricular white matter compatible with chronic microvascular disease. Prominence of the sulci and ventricles compatible with brain atrophy. Vascular: No hyperdense vessel or unexpected calcification. Skull: Normal. Negative for fracture or focal lesion. Sinuses/Orbits: No acute finding. Other: None IMPRESSION: 1. No acute intracranial abnormalities. 2. Chronic small vessel ischemic disease and brain atrophy 3. New subacute to chronic right cerebellar hemisphere infarct. Electronically Signed   By: Kerby Moors M.D.   On: 04/23/2021 17:29  ? ?CT Angio Chest Pulmonary Embolism (PE) W or WO Contrast ? ?Result Date: 04/24/2021 ?CLINICAL DATA:  Patient from home complains of weakness for 3 days. EXAM: CT ANGIOGRAPHY CHEST WITH CONTRAST TECHNIQUE: Multidetector CT imaging of the chest was performed using the standard protocol during bolus administration of intravenous contrast. Multiplanar CT image reconstructions and MIPs were obtained to evaluate the vascular anatomy. RADIATION DOSE REDUCTION: This exam was performed according to the departmental dose-optimization program which includes automated exposure control, adjustment of the mA and/or kV according to patient size and/or use of iterative reconstruction technique. CONTRAST:  35mL OMNIPAQUE IOHEXOL 350 MG/ML SOLN COMPARISON:  None. FINDINGS: Cardiovascular: Satisfactory opacification of the pulmonary arteries to the segmental level. There is filling defect in the distal right pulmonary artery, proximal right upper, right middle and lower lobe pulmonary artery branches. There is also suggestion of embolus in the SVC. Normal heart size. No pericardial effusion. Coronary artery atherosclerotic calcifications. No evidence of right heart strain. Mediastinum/Nodes: No enlarged mediastinal, hilar, or axillary lymph nodes. Thyroid gland, trachea, and esophagus demonstrate no  significant findings. Lungs/Pleura: There are few peripheral triangular opacities in the dependent right lower lobe which may represent atelectasis or small pulmonary infarcts due to embolism. Mild centrilobular emphysematous changes prominent in the upper lobes. Upper Abdomen: No acute abnormality. Musculoskeletal: No chest wall abnormality. No acute or significant osseous findings. Review of the MIP images confirms the above findings. IMPRESSION: 1. Pulmonary embolism in the distal right pulmonary artery, as well as in the proximal right upper middle and lower lobe pulmonary artery branches. No evidence of right heart strain. 2. Emphysematous changes prominent in the upper lobes. Few small triangular peripheral opacities in the right lower lobe, which may represent atelectasis or small pulmonary infarct due to embolism. Findings of pulmonary embolism were reported to Dr. Starleen Blue at approximately 10:20 a.m. on 04/24/2021. Aortic Atherosclerosis (ICD10-I70.0) and Emphysema (ICD10-J43.9). Electronically Signed   By: Judye Bos.O.  On: 04/24/2021 10:26  ? ?MR BRAIN WO CONTRAST ? ?Result Date: 04/24/2021 ?CLINICAL DATA:  Follow-up examination for stroke. EXAM: MRI HEAD WITHOUT CONTRAST TECHNIQUE: Multiplanar, multiecho pulse sequences of the brain and surrounding structures were obtained without intravenous contrast. COMPARISON:  Comparison made with prior CT from 04/23/2021 and MRI from 09/15/2018 FINDINGS: Brain: Advanced atrophy noted about the brainstem, with comparatively mild to moderate atrophy elsewhere about both cerebral hemispheres and cerebellum. Patchy and confluent T2/FLAIR hyperintensity involving the periventricular and deep white matter both cerebral hemispheres as well as the pons, consistent with chronic small vessel ischemic disease. Multiple scattered remote lacunar infarcts present about the deep gray nuclei, most pronounced at the thalami, with multiple remote lacunar infarcts of the pons. Few  small remote bilateral cerebellar infarcts. No abnormal foci of restricted diffusion to suggest acute or subacute ischemia. Gray-white matter differentiation otherwise maintained. No other areas of chronic co

## 2021-04-25 NOTE — Plan of Care (Signed)
?Problem: Education: ?Goal: Knowledge of General Education information will improve ?Description: Including pain rating scale, medication(s)/side effects and non-pharmacologic comfort measures ?04/25/2021 0241 by Abram Sander, RN ?Outcome: Progressing ?04/25/2021 0240 by Abram Sander, RN ?Outcome: Progressing ?  ?Problem: Health Behavior/Discharge Planning: ?Goal: Ability to manage health-related needs will improve ?04/25/2021 0241 by Abram Sander, RN ?Outcome: Progressing ?04/25/2021 0240 by Abram Sander, RN ?Outcome: Progressing ?  ?Problem: Clinical Measurements: ?Goal: Ability to maintain clinical measurements within normal limits will improve ?04/25/2021 0241 by Abram Sander, RN ?Outcome: Progressing ?04/25/2021 0240 by Abram Sander, RN ?Outcome: Progressing ?Goal: Will remain free from infection ?04/25/2021 0241 by Abram Sander, RN ?Outcome: Progressing ?04/25/2021 0240 by Abram Sander, RN ?Outcome: Progressing ?Goal: Diagnostic test results will improve ?04/25/2021 0241 by Abram Sander, RN ?Outcome: Progressing ?04/25/2021 0240 by Abram Sander, RN ?Outcome: Progressing ?  ?Problem: Activity: ?Goal: Risk for activity intolerance will decrease ?04/25/2021 0241 by Abram Sander, RN ?Outcome: Progressing ?04/25/2021 0240 by Abram Sander, RN ?Outcome: Progressing ?  ?Problem: Elimination: ?Goal: Will not experience complications related to bowel motility ?04/25/2021 0241 by Abram Sander, RN ?Outcome: Progressing ?04/25/2021 0240 by Abram Sander, RN ?Outcome: Progressing ?Goal: Will not experience complications related to urinary retention ?04/25/2021 0241 by Abram Sander, RN ?Outcome: Progressing ?04/25/2021 0240 by Abram Sander, RN ?Outcome: Progressing ?  ?Problem: Safety: ?Goal: Ability to remain free from injury will improve ?04/25/2021 0241 by Abram Sander, RN ?Outcome: Progressing ?04/25/2021 0240 by Abram Sander,  RN ?Outcome: Progressing ?  ?Problem: Education: ?Goal: Knowledge of disease or condition will improve ?04/25/2021 0241 by Abram Sander, RN ?Outcome: Progressing ?04/25/2021 0240 by Abram Sander, RN ?Outcome: Progressing ?Goal: Knowledge of secondary prevention will improve (SELECT ALL) ?04/25/2021 0241 by Abram Sander, RN ?Outcome: Progressing ?04/25/2021 0240 by Abram Sander, RN ?Outcome: Progressing ?Goal: Knowledge of patient specific risk factors will improve (INDIVIDUALIZE FOR PATIENT) ?04/25/2021 0241 by Abram Sander, RN ?Outcome: Progressing ?04/25/2021 0240 by Abram Sander, RN ?Outcome: Progressing ?Goal: Individualized Educational Video(s) ?04/25/2021 0241 by Abram Sander, RN ?Outcome: Progressing ?04/25/2021 0240 by Abram Sander, RN ?Outcome: Progressing ?  ?Problem: Coping: ?Goal: Will verbalize positive feelings about self ?04/25/2021 0241 by Abram Sander, RN ?Outcome: Progressing ?04/25/2021 0240 by Abram Sander, RN ?Outcome: Progressing ?Goal: Will identify appropriate support needs ?04/25/2021 0241 by Abram Sander, RN ?Outcome: Progressing ?04/25/2021 0240 by Abram Sander, RN ?Outcome: Progressing ?  ?Problem: Health Behavior/Discharge Planning: ?Goal: Ability to manage health-related needs will improve ?04/25/2021 0241 by Abram Sander, RN ?Outcome: Progressing ?04/25/2021 0240 by Abram Sander, RN ?Outcome: Progressing ?  ?Problem: Self-Care: ?Goal: Ability to participate in self-care as condition permits will improve ?04/25/2021 0241 by Abram Sander, RN ?Outcome: Progressing ?04/25/2021 0240 by Abram Sander, RN ?Outcome: Progressing ?Goal: Verbalization of feelings and concerns over difficulty with self-care will improve ?04/25/2021 0241 by Abram Sander, RN ?Outcome: Progressing ?04/25/2021 0240 by Abram Sander, RN ?Outcome: Progressing ?Goal: Ability to communicate needs accurately will improve ?04/25/2021 0241  by Abram Sander, RN ?Outcome: Progressing ?04/25/2021 0240 by Abram Sander, RN ?Outcome: Progressing ?  ?Problem: Nutrition: ?Goal: Risk of aspiration will decrease ?04/25/2021 0241 by Abram Sander, RN ?Outcome: Progressing ?04/25/2021 0240 by Abram Sander, RN ?Outcome: Progressing ?Goal: Dietary intake will improve ?04/25/2021 0241 by Abram Sander, RN ?Outcome: Progressing ?04/25/2021 0240  by Abram Sander, RN ?Outcome: Progressing ?  ?Problem: Ischemic Stroke/TIA Tissue Perfusion: ?Goal: Complications of ischemic stroke/TIA will be minimized ?04/25/2021 0241 by Abram Sander, RN ?Outcome: Progressing ?04/25/2021 0240 by Abram Sander, RN ?Outcome: Progressing ?  ?

## 2021-04-25 NOTE — Progress Notes (Addendum)
[facility] Progress Note ? ?Patient: [name]    CYE:[id number]  DOA: 04/23/2021    ? ?Date of Service: the patient was seen and examined on 04/25/2021 ? ?Chief Complaint  ?Patient presents with  ? Weakness  ? ?Brief hospital course: ? [name] is a 61 y.o. male with medical history significant of hypertension, hyperlipidemia, diabetes mellitus, CVA 2020, osteomyelitis s/p  right 2nd, great toe amputation who presents to ed BIB EMS with complaints of generalized weakness for the las 2-3 days. Per chart when EMS arrived patient was laying in urine soaked clothing.  Patient states he feels very fatigued but  has no n/v/d/abdominal pain/ dysuria/ f/c/chest pain or shortness of breath or uri sxs. ?  ?ED Course:  ?Vitals: ?Afeb, 98.9,  bp 164/91 hr 77, rr 18 sat 98%  ?ETK:KOECX rhythm  borderline st changes  ?Trop 1hr 65 2 hour80  ?Labs: ?134, K 3.4, glu 146 ?Lactic 1.2  ?Wbc 6.7,hgb 14.2 plt 161  ?CT head ?1. New subacute to chronic right cerebellar hemisphere infarct. ?2. Chronic small vessel ischemic disease and brain atrophy ? ? ?Assessment and Plan: ? ?Acute pulmonary embolism ?Patient presented with debility and failure to thrive for past 3 days ?no infectious etiology found ?Subacute cva  noted on CT new from prior CT  ?MRI brain: No acute intracranial abnormality. 2. Chronic microvascular ischemic disease, with multiple remote lacunar infarcts involving the deep gray nuclei, pons, and cerebellum. ?Underlying age advanced atrophy, most pronounced at the brainstem, Diffuse ventricular prominence, similar to previous. While this finding may be related to underlying atrophy, a degree of NPH could contributory, and could be considered in the correct clinical setting. ?Absent flow void within the left V4 segment, likely occluded, stable from previous. ?CTA chest : pulmonary embolism in the distal right pulmonary artery, as well as in the proximal right upper middle and lower lobe pulmonary artery  branches. No evidence of right heart strain. ?D-dimer 1.05 slightly elevated ?Troponin slightly elevated, EKG no significant changes ?LDL 33, procalcitonin negative, CK1 6 9 within normal range, lactic acid 1.2, ESR 27 ?S/p heparin IV infusion, started Eliquis 10 mg p.o. twice daily for 7 days followed by 5 mg p.o. twice daily started on 4/3 PM dose ?Follow venous duplex of lower extremities to rule out DVT ? ? ?TIA/CVA ?-hx of CVA 2020 ?- place on cva /tia protocol  ?- MRI negative for acute CVA.  Complete report as above ?echo, pending  ?- SLP/OT/PT/neuro checks  ?- continue on ASA, statin, discontinued Plavix from 4/4 pt received a dose on 4/3 ?-neurology consulted, recommended DOAC from neuro perspective. ?  ?  ?Elevated troponin, type II MI due to underlying CAD  ?ACS ruled out, no further intervention as per cardiology.   ?-borderline st changes  ?- no sob/chest pain or cardiac complaints  ?Continue aspirin and statin, discontinued Plavix from 4/4 pt received a dose on 4/3 ?- f/u echo, still report is pending ? ? ?Hypertension ?Continue amlodipine home dose ?We will continue monitor BP and titrate medications accordingly ? ? Hyperlipidemia ?-continue Lipitor 20 mg p.o. daily ?-LDL 33 below <70  ?  ?  ?Diabetes mellitus ?- place no finger sticks , iss  ?-hold metformin  ?  ?Hx of Osteomyelitis  ?-s/p right 1 st and 2nd toe amputation ? ? ?Body mass index is 25.84 kg/m?.  ?Interventions: ?  ?Follow PT and OT eval for disposition plan ?   ? ?Diet: Heart healthy and carb modified ?DVT  Prophylaxis: Therapeutic Anticoagulation with heparin IV infusion   ? ?Advance goals of care discussion: Full code ? ?Family Communication: family was present at bedside, at the time of interview.  ?The pt provided permission to discuss medical plan with the family. Opportunity was given to ask question and all questions were answered satisfactorily.  ?4/3 discussed with patient's Sister [name] over the phone ? ?Disposition:  ?Pt is  from home, admitted with AMS, found to have pulmonary embolism on IV heparin infusion, waiting for PT and OT eval, which precludes a safe discharge. ?Discharge to home versus SNF TBD after PT and OT eval, when clinically stable. ? ?Subjective: No significant events overnight, patient is gradually getting better AO x2, denied any chest palpitation, no shortness of breath. ? ? ?Physical Exam: ?General:  lethargic oriented to place and person.  ?Appear in no distress, affect flat in affect ?Eyes: PERRLA ?ENT: Oral Mucosa Clear, moist  ?Neck: no JVD,  ?Cardiovascular: S1 and S2 Present, no Murmur,  ?Respiratory: good respiratory effort, Bilateral Air entry equal and Decreased, no Crackles, no wheezes ?Abdomen: Bowel Sound present, Soft and no tenderness,  ?Skin: no rashes ?Extremities: no Pedal edema, no calf tenderness, s/p Right 1st and 2nd Toe amputation due to h/o OM, dry skin ?Neurologic: without any new focal findings ?Gait not checked due to patient safety concerns ? ?Vitals:  ? 04/24/21 2307 04/25/21 0427 04/25/21 0744 04/25/21 1135  ?BP: 139/88 139/77 131/88 (!) 145/72  ?Pulse: 70 68 72 74  ?Resp: 20 18 16 16   ?Temp: 98.6 ?F (37 ?C) 98.5 ?F (36.9 ?C) 98.3 ?F (36.8 ?C) 98 ?F (36.7 ?C)  ?TempSrc:      ?SpO2: 100% 100% 100% 100%  ?Weight:      ?Height:      ? ? ?Intake/Output Summary (Last 24 hours) at 04/25/2021 1441 ?Last data filed at 04/25/2021 0455 ?Gross per 24 hour  ?Intake 345.89 ml  ?Output --  ?Net 345.89 ml  ? ?Filed Weights  ? 04/23/21 2011 04/24/21 1415  ?Weight: 74.8 kg 72.1 kg  ? ? ?Data Reviewed: ?I have personally reviewed and interpreted daily labs, tele strips, imagings as discussed above. ?I reviewed all nursing notes, pharmacy notes, vitals, pertinent old records ?I have discussed plan of care as described above with RN and patient/family. ? ?CBC: ?Recent Labs  ?Lab 04/23/21 ?1657 04/24/21 ?0308 04/25/21 ?7654  ?WBC 6.7 7.0 5.5  ?HGB 14.2 14.4 13.8  ?HCT 43.0 44.9 41.6  ?MCV 87.9 89.4 87.8  ?PLT  161 163 182  ? ?Basic Metabolic Panel: ?Recent Labs  ?Lab 04/23/21 ?1657 04/24/21 ?6503 04/25/21 ?0353  ?NA 134*  --  134*  ?K 3.4*  --  3.5  ?CL 99  --  101  ?CO2 26  --  26  ?GLUCOSE 146*  --  67*  ?BUN 19  --  20  ?CREATININE 0.67  --  0.86  ?CALCIUM 8.7*  --  8.5*  ?MG  --  2.0 2.0  ?PHOS  --   --  3.6  ? ? ?Studies: ?No results found.  ?Scheduled Meds: ?  stroke: mapping our early stages of recovery book   Does not apply Once  ? amLODipine  2.5 mg Oral Daily  ? aspirin EC  81 mg Oral Daily  ? atorvastatin  20 mg Oral q1800  ? clopidogrel  75 mg Oral Q breakfast  ? levETIRAcetam  500 mg Oral BID  ? ?Continuous Infusions: ? heparin 1,200 Units/hr (04/25/21 0455)  ? ?  PRN Meds: acetaminophen **OR** acetaminophen (TYLENOL) oral liquid 160 mg/5 mL **OR** acetaminophen, senna-docusate ? ?Time spent: 40 minutes ? ?Author: ?Val Riles MD ?[facility] ?04/25/2021 2:41 PM ? ?To reach On-call, see care teams to locate the attending and reach out to them via [email or web address]. ?If 7PM-7AM, please contact night-coverage ?If you still have difficulty reaching the attending provider, please page the [facility] (Director on Call) for [facility] on amion for assistance. ? ?

## 2021-04-26 ENCOUNTER — Inpatient Hospital Stay (HOSPITAL_COMMUNITY)
Admit: 2021-04-26 | Discharge: 2021-04-26 | Disposition: A | Discharge status: Home / Self Care | Payer: Medicare Other | Attending: Internal Medicine | Admitting: Internal Medicine

## 2021-04-26 ENCOUNTER — Inpatient Hospital Stay: Payer: Medicare Other

## 2021-04-26 DIAGNOSIS — R531 Weakness: Secondary | ICD-10-CM | POA: Diagnosis not present

## 2021-04-26 DIAGNOSIS — E119 Type 2 diabetes mellitus without complications: Secondary | ICD-10-CM

## 2021-04-26 DIAGNOSIS — I248 Other forms of acute ischemic heart disease: Secondary | ICD-10-CM

## 2021-04-26 DIAGNOSIS — I6389 Other cerebral infarction: Secondary | ICD-10-CM | POA: Diagnosis not present

## 2021-04-26 DIAGNOSIS — I2699 Other pulmonary embolism without acute cor pulmonale: Secondary | ICD-10-CM | POA: Diagnosis not present

## 2021-04-26 DIAGNOSIS — S91301A Unspecified open wound, right foot, initial encounter: Secondary | ICD-10-CM | POA: Diagnosis not present

## 2021-04-26 LAB — BASIC METABOLIC PANEL
Anion gap: 8 (ref 5–15)
BUN: 15 mg/dL (ref 6–20)
CO2: 23 mmol/L (ref 22–32)
Calcium: 8.6 mg/dL — ABNORMAL LOW (ref 8.9–10.3)
Chloride: 102 mmol/L (ref 98–111)
Creatinine, Ser: 0.62 mg/dL (ref 0.61–1.24)
GFR, Estimated: >60 mL/min (ref >60)
Glucose, Bld: 124 mg/dL — ABNORMAL HIGH (ref 70–99)
Potassium: 3.9 mmol/L (ref 3.5–5.1)
Sodium: 133 mmol/L — ABNORMAL LOW (ref 135–145)

## 2021-04-26 LAB — ECHOCARDIOGRAM COMPLETE
AR max vel: 3.08 cm2
AV Area VTI: 2.91 cm2
AV Area mean vel: 3.06 cm2
AV Mean grad: 3.3 mmHg
AV Peak grad: 6.1 mmHg
Ao pk vel: 1.24 m/s
Area-P 1/2: 2.92 cm2
Height: 67 in
MV VTI: 2.62 cm2
S' Lateral: 3.1 cm
Weight: 2542.4 oz

## 2021-04-26 LAB — GLUCOSE, CAPILLARY
Glucose-Capillary: 115 mg/dL — ABNORMAL HIGH (ref 70–99)
Glucose-Capillary: 112 mg/dL — ABNORMAL HIGH (ref 70–99)
Glucose-Capillary: 96 mg/dL (ref 70–99)
Glucose-Capillary: 161 mg/dL — ABNORMAL HIGH (ref 70–99)

## 2021-04-26 LAB — HEMOGLOBIN A1C
Hgb A1c MFr Bld: 6 % — ABNORMAL HIGH (ref 4.8–5.6)
Mean Plasma Glucose: 126 mg/dL

## 2021-04-26 LAB — CBC
HCT: 41.8 % (ref 39.0–52.0)
Hemoglobin: 14.5 g/dL (ref 13.0–17.0)
MCH: 29.9 pg (ref 26.0–34.0)
MCHC: 34.7 g/dL (ref 30.0–36.0)
MCV: 86.2 fL (ref 80.0–100.0)
Platelets: 194 10*3/uL (ref 150–400)
RBC: 4.85 MIL/uL (ref 4.22–5.81)
RDW: 11.9 % (ref 11.5–15.5)
WBC: 5.9 10*3/uL (ref 4.0–10.5)
nRBC: 0 % (ref 0.0–0.2)

## 2021-04-26 LAB — HEPARIN LEVEL (UNFRACTIONATED): Heparin Unfractionated: >1.1 IU/mL — ABNORMAL HIGH (ref 0.30–0.70)

## 2021-04-26 LAB — PHOSPHORUS: Phosphorus: 3 mg/dL (ref 2.5–4.6)

## 2021-04-26 LAB — MAGNESIUM: Magnesium: 2 mg/dL (ref 1.7–2.4)

## 2021-04-26 MED ORDER — AMLODIPINE BESYLATE 5 MG PO TABS
5.0000 mg | ORAL_TABLET | Freq: Every day | ORAL | Status: DC
  Filled 2021-04-26: qty 1

## 2021-04-26 MED ORDER — HYDRALAZINE HCL 50 MG PO TABS: 25.0000 mg | ORAL_TABLET | Freq: Four times a day (QID) | ORAL | Status: DC | PRN

## 2021-04-26 NOTE — Evaluation (Signed)
Physical Therapy Evaluation ?Patient Details ?Name: [name] ?MRN: [id number] ?DOB: Mar 18, 1960 ?Today's Date: 04/26/2021 ? ?History of Present Illness ? Patient is a 61 year old male with a PMH (+) for diabetes, prior CVA with weakness presents to the emergency department for generalized weakness. Small PE found on chest CT on 04/24/21. ?  ?Clinical Impression ? Physical Therapy Evaluation completed this date. Patient tolerated session fairly well and was agreeable to treatment. Patient's chart reviewed, and home setup/PLOF was received from previous admission and reviewed with patient. Patient states he utilizes a 2 wheeled walker at home, and 1 lives in a 1 story apartment with his brother. About 1 flight of steps to enter. No pain reported throughout session. Patient demonstrated a decline in strength, balance, and functional mobility compared to his PLOF. Patient currently demonstrates at least 3/5 strength in BLEs, Mod-Max A for all bed mobility, Mod A to complete x2 sit to stand transfers from EOB, and Min A to maintain stable in standing with use of RW. Ambulation was deferred this session due to safety concerns. Patient required cueing on proper hand placement to complete sit to stands, and cueing on upright posture in standing, however patient was unable to demonstrate ability to due so due to BLE weakness. Patient would benefit from continued skilled physical therapy in order to optimize patient's return to PLOF. Recommend STR upon discharge from acute hospitalization.  ?   ? ?Recommendations for follow up therapy are one component of a multi-disciplinary discharge planning process, led by the attending physician.  Recommendations may be updated based on patient status, additional functional criteria and insurance authorization. ? ?Follow Up Recommendations Skilled nursing-short term rehab (<3 hours/day) ? ?  ?Assistance Recommended at Discharge Frequent or constant Supervision/Assistance  ?Patient can  return home with the following ? Two people to help with walking and/or transfers;A lot of help with bathing/dressing/bathroom;Help with stairs or ramp for entrance;Assist for transportation ? ?  ?Equipment Recommendations Other (comment) (defer to next level of care)  ?Recommendations for Other Services ?    ?  ?Functional Status Assessment Patient has had a recent decline in their functional status and demonstrates the ability to make significant improvements in function in a reasonable and predictable amount of time.  ? ?  ?Precautions / Restrictions Precautions ?Precautions: Fall ?Restrictions ?Weight Bearing Restrictions: No  ? ?  ? ?Mobility ? Bed Mobility ?Overal bed mobility: Needs Assistance ?Bed Mobility: Supine to Sit, Sit to Supine ?  ?  ?Supine to sit: Mod assist ?Sit to supine: Max assist (at trunk and BLEs) ?  ?General bed mobility comments: Mod A through B HHA to pull up into sitting, patient was able to move BLEs to EOB ?  ? ?Transfers ?Overall transfer level: Needs assistance ?Equipment used: Rollator (4 wheels) ?Transfers: Sit to/from Stand (x2) ?Sit to Stand: Mod assist ?  ?  ?  ?  ?  ?General transfer comment: significant posterior lean in standing, cueing for upright posture however patient was unable to complete ?  ? ?Ambulation/Gait ?Ambulation/Gait assistance:  (deferred due to safety) ?  ?  ?  ?  ?  ?  ?  ? ?Stairs ?  ?  ?  ?  ?  ? ?Wheelchair Mobility ?  ? ?Modified Rankin (Stroke Patients Only) ?  ? ?  ? ?Balance Overall balance assessment: Needs assistance ?Sitting-balance support: Bilateral upper extremity supported, Feet supported ?Sitting balance-Leahy Scale: Poor ?Sitting balance - Comments: Initailly supervision with static sitting, however with  fatigue patient required Min A to maintain upright posture due to left posterior lean ?Postural control: Left lateral lean, Posterior lean ?Standing balance support: Bilateral upper extremity supported, During functional activity, Reliant on  assistive device for balance ?Standing balance-Leahy Scale: Poor ?Standing balance comment: Min A to remain stable in standing ?  ?  ?  ?  ?  ?  ?  ?  ?  ?  ?  ?   ? ? ? ?Pertinent Vitals/Pain Pain Assessment ?Pain Assessment: No/denies pain  ? ? ?Home Living Family/patient expects to be discharged to:: Private residence ?Living Arrangements: Other relatives (lives with brother) ?Available Help at Discharge: Family ?Type of Home: Apartment ?Home Access: Stairs to enter ?Entrance Stairs-Rails: Right;Left;Can reach both ?Entrance Stairs-Number of Steps: 1 flight ?  ?Home Layout: One level ?Home Equipment: Conservation officer, nature (2 wheels) ?   ?  ?Prior Function   ?  ?  ?  ?  ?  ?  ?Mobility Comments: RW for mobility ?ADLs Comments: independent ?  ? ? ?Hand Dominance  ? Dominant Hand: Right ? ?  ?Extremity/Trunk Assessment  ? Upper Extremity Assessment ?Upper Extremity Assessment: Generalized weakness;Defer to OT evaluation ?  ? ?Lower Extremity Assessment ?Lower Extremity Assessment: Generalized weakness (at least 3/5 strength in BLEs) ?  ? ?   ?Communication  ? Communication: Expressive difficulties  ?Cognition Arousal/Alertness: Awake/alert ?Behavior During Therapy: [facility] for tasks assessed/performed ?Overall Cognitive Status: No family/caregiver present to determine baseline cognitive functioning ?  ?  ?  ?  ?  ?  ?  ?  ?  ?  ?  ?  ?  ?  ?  ?  ?General Comments: A&Ox3-self location situation ?  ?  ? ?  ?General Comments General comments (skin integrity, edema, etc.): SpO2 remained >90% throughout session, HR ranged from 74-83bpm ? ?  ?Exercises Other Exercises ?Other Exercises: patient educated on role of PT in acute setting, fall risk, and d/c recommendations  ? ?Assessment/Plan  ?  ?PT Assessment Patient needs continued PT services  ?PT Problem List Decreased strength;Decreased mobility;Decreased safety awareness;Decreased activity tolerance;Decreased balance;Decreased knowledge of use of DME ? ?   ?  ?PT Treatment  Interventions DME instruction;Therapeutic activities;Gait training;Therapeutic exercise;Cognitive remediation;Patient/family education;Stair training;Balance training;Functional mobility training   ? ?PT Goals (Current goals can be found in the Care Plan section)  ?Acute Rehab PT Goals ?Patient Stated Goal: to get better ?PT Goal Formulation: With patient ?Time For Goal Achievement: 05/10/21 ?Potential to Achieve Goals: Fair ? ?  ?Frequency Min 2X/week ?  ? ? ?Co-evaluation   ?  ?  ?  ?  ? ? ?  ?AM-PAC PT "6 Clicks" Mobility  ?Outcome Measure Help needed turning from your back to your side while in a flat bed without using bedrails?: A Lot ?Help needed moving from lying on your back to sitting on the side of a flat bed without using bedrails?: A Lot ?Help needed moving to and from a bed to a chair (including a wheelchair)?: A Lot ?Help needed standing up from a chair using your arms (e.g., wheelchair or bedside chair)?: A Lot ?Help needed to walk in hospital room?: Total ?Help needed climbing 3-5 steps with a railing? : Total ?6 Click Score: 10 ? ?  ?End of Session Equipment Utilized During Treatment: Gait belt ?Activity Tolerance: Patient limited by fatigue;Patient tolerated treatment well ?Patient left: in bed;with call bell/phone within reach;with bed alarm set ?Nurse Communication: Mobility status ?PT Visit Diagnosis: Unsteadiness on feet (  R26.81);Muscle weakness (generalized) (M62.81);Difficulty in walking, not elsewhere classified (R26.2) ?  ? ?Time: 4944-9675 ?PT Time Calculation (min) (ACUTE ONLY): 19 min ? ? ?Charges:   PT Evaluation ?$PT Eval Low Complexity: 1 Low ?  ?  ?   ? ?Iva Boop, PT  ?04/26/21. 11:18 AM ? ? ?

## 2021-04-26 NOTE — Evaluation (Signed)
Occupational Therapy Evaluation ?Patient Details ?Name: [name] ?MRN: [id number] ?DOB: [birth year] ?Today's Date: 04/26/2021 ? ? ?History of Present Illness Patient is a 61 year old male with a PMH (+) for diabetes, prior CVA with weakness presents to the emergency department for generalized weakness. Small PE found on chest CT on 04/24/21.  ? ?Clinical Impression ?  ?Pt was seen for OT evaluation this date. Prior to hospital admission, pt was living with his brother in an apartment with a full flight of stairs to access. Pt reports using a 2WW for mobility and indep with ADL at home. Alert and oriented x3, follows commands well, but does appear to have decreased insight into deficits. No family present to verify PLOF. Currently pt demonstrates impairments as described below (See OT problem list) which functionally limit his ability to perform ADL/self-care tasks. Pt currently requires MOD-MAX A for bed mobility and ADL transfers with a RW and unable to tolerate standing very long. Able to take a couple side steps along the EOB with MIN A. Pt demo'd significant difficulty with doffing R sock, requiring MAX A to don from seated position. Overall, requires MOD A for LB ADL and MIN A for UB ADL from seated position. Pt would benefit from skilled OT services to address noted impairments and functional limitations (see below for any additional details) in order to maximize safety and independence while minimizing falls risk and caregiver burden. Upon hospital discharge, recommend STR to maximize pt safety and return to PLOF.   ? ?Recommendations for follow up therapy are one component of a multi-disciplinary discharge planning process, led by the attending physician.  Recommendations may be updated based on patient status, additional functional criteria and insurance authorization.  ? ?Follow Up Recommendations ? Skilled nursing-short term rehab (<3 hours/day)  ?  ?Assistance Recommended at Discharge Frequent or constant  Supervision/Assistance  ?Patient can return home with the following A lot of help with walking and/or transfers;A lot of help with bathing/dressing/bathroom;Direct supervision/assist for medications management;Assistance with cooking/housework;Assist for transportation;Help with stairs or ramp for entrance;Direct supervision/assist for financial management ? ?  ?Functional Status Assessment ? Patient has had a recent decline in their functional status and demonstrates the ability to make significant improvements in function in a reasonable and predictable amount of time.  ?Equipment Recommendations ? BSC/3in1;Other (comment) (2WW)  ?  ?Recommendations for Other Services   ? ? ?  ?Precautions / Restrictions Precautions ?Precautions: Fall ?Restrictions ?Weight Bearing Restrictions: No  ? ?  ? ?Mobility Bed Mobility ?Overal bed mobility: Needs Assistance ?Bed Mobility: Supine to Sit, Sit to Supine ?  ?  ?Supine to sit: Mod assist, Max assist, HOB elevated ?Sit to supine: Mod assist, Max assist ?  ?  ?  ? ?Transfers ?Overall transfer level: Needs assistance ?Equipment used: Rolling walker (2 wheels) ?Transfers: Sit to/from Stand ?Sit to Stand: Mod assist, Max assist ?  ?  ?  ?  ?  ?General transfer comment: VC for anterior weight shift to prevent posterior LOB ?  ? ?  ?Balance Overall balance assessment: Needs assistance ?Sitting-balance support: Bilateral upper extremity supported, Feet supported, Single extremity supported ?Sitting balance-Leahy Scale: Poor ?Sitting balance - Comments: SBA, no LOB ?Postural control: Posterior lean ?Standing balance support: Bilateral upper extremity supported, During functional activity, Reliant on assistive device for balance ?Standing balance-Leahy Scale: Poor ?Standing balance comment: CGA-MIN A ?  ?  ?  ?  ?  ?  ?  ?  ?  ?  ?  ?   ? ?  ADL either performed or assessed with clinical judgement  ? ?ADL Overall ADL's : Needs assistance/impaired ?  ?  ?  ?  ?  ?  ?  ?  ?  ?  ?  ?  ?  ?   ?  ?  ?  ?  ?  ?General ADL Comments: Pt required significant time and effort to doff R sock and ultimately unable to donn R sock after. Pt overall requires MOD A for LB ADL, MOD-MAX A for ADL transfers with RW, and MIN A for seated UB ADL tasks.  ? ? ? ?Vision   ?   ?   ?Perception   ?  ?Praxis   ?  ? ?Pertinent Vitals/Pain Pain Assessment ?Pain Assessment: No/denies pain  ? ? ? ?Hand Dominance Right ?  ?Extremity/Trunk Assessment Upper Extremity Assessment ?Upper Extremity Assessment: Generalized weakness ?  ?Lower Extremity Assessment ?Lower Extremity Assessment: Generalized weakness ?  ?  ?  ?Communication Communication ?Communication: Expressive difficulties ?  ?Cognition Arousal/Alertness: Awake/alert ?Behavior During Therapy: [facility] for tasks assessed/performed ?Overall Cognitive Status: No family/caregiver present to determine baseline cognitive functioning ?  ?  ?  ?  ?  ?  ?  ?  ?  ?  ?  ?  ?  ?  ?  ?  ?General Comments: A&Ox3- but follows commands well, decreased insight into deficits ?  ?  ?General Comments  SpO2 remained >90% throughout session, HR ranged from 74-83bpm ? ?  ?Exercises Other Exercises ?Other Exercises: Pt educated in ADL transfers, side steps along bed with RW requiring MIN A ?  ?Shoulder Instructions    ? ? ?Home Living Family/patient expects to be discharged to:: Private residence ?Living Arrangements: Other relatives (lives with brother) ?Available Help at Discharge: Family (brother) ?Type of Home: Apartment ?Home Access: Stairs to enter ?Entrance Stairs-Number of Steps: 1 flight ?Entrance Stairs-Rails: Right;Left;Can reach both ?Home Layout: One level ?  ?  ?Bathroom Shower/Tub: Tub/shower unit ?  ?Bathroom Toilet: Standard ?  ?  ?Home Equipment: Conservation officer, nature (2 wheels) ?  ?Additional Comments: no family present to verify ? Lives With: Family ? ?  ?Prior Functioning/Environment   ?  ?  ?  ?  ?  ?  ?Mobility Comments: RW for mobility ?ADLs Comments: independent ?  ? ?  ?  ?OT Problem  List: Decreased strength;Decreased activity tolerance;Decreased safety awareness;Decreased knowledge of use of DME or AE;Impaired balance (sitting and/or standing) ?  ?   ?OT Treatment/Interventions: Therapeutic exercise;Self-care/ADL training;Therapeutic activities;DME and/or AE instruction;Patient/family education;Balance training  ?  ?OT Goals(Current goals can be found in the care plan section) Acute Rehab OT Goals ?Patient Stated Goal: go home with brother ?OT Goal Formulation: With patient ?Time For Goal Achievement: 05/10/21 ?Potential to Achieve Goals: Good ?ADL Goals ?Pt Will Perform Lower Body Dressing: with min assist;sit to/from stand ?Pt Will Transfer to Toilet: ambulating;with min guard assist;bedside commode (LRAD) ?Pt Will Perform Toileting - Clothing Manipulation and hygiene: sitting/lateral leans;with set-up;with supervision ?Additional ADL Goal #1: Pt will verbalize plan to implement at least 1 learned falls prevention strategy to maximize safety with mobility/ADL.  ?OT Frequency: Min 2X/week ?  ? ?Co-evaluation   ?  ?  ?  ?  ? ?  ?AM-PAC OT "6 Clicks" Daily Activity     ?Outcome Measure Help from another person eating meals?: None ?Help from another person taking care of personal grooming?: A Little ?Help from another person toileting, which includes using toliet, bedpan, or  urinal?: A Lot ?Help from another person bathing (including washing, rinsing, drying)?: A Lot ?Help from another person to put on and taking off regular upper body clothing?: A Little ?Help from another person to put on and taking off regular lower body clothing?: A Lot ?6 Click Score: 16 ?  ?End of Session Equipment Utilized During Treatment: Rolling walker (2 wheels);Gait belt ? ?Activity Tolerance: Patient tolerated treatment well ?Patient left: in bed;with call bell/phone within reach;with bed alarm set ? ?OT Visit Diagnosis: Other abnormalities of gait and mobility (R26.89);Muscle weakness (generalized) (M62.81)  ?               ?Time: 5732-2025 ?OT Time Calculation (min): 12 min ?Charges:  OT General Charges ?$OT Visit: 1 Visit ?OT Evaluation ?$OT Eval Moderate Complexity: 1 Mod ? ?Ardeth Perfect., MPH, MS, OTR/L ?ascom 336

## 2021-04-26 NOTE — TOC Initial Note (Signed)
Transition of Care (TOC) - Initial/Assessment Note  ? ? ?Patient Details  ?Name: [name] ?MRN: [id number] ?Date of Birth: 1960-05-28 ? ?Transition of Care (TOC) CM/SW Contact:    ?Candie Chroman, LCSW ?Phone Number: ?04/26/2021, 12:40 PM ? ?Clinical Narrative:    CSW met with patient. No supports at bedside. CSW introduced role and explained that PT recommendations would be discussed. Patient does not want SNF placement and said his brother could provide assistance. He gave CSW permission to call his brother but line was busy so called his sister. She asked if CIR would review his case for potential admission. Sent secure chat to admissions coordinator. If they cannot accept she wants him to go home with home health. She does not want him placed in a SNF. She said she promised him if he came to the hospital, he would not have to go to a nursing home. No further concerns. CSW encouraged patient and his sister to contact CSW as needed. CSW will continue to follow patient and his sister for support and facilitate discharge once medically stable.             ? ?Expected Discharge Plan:  (TBD) ?Barriers to Discharge: Continued Medical Work up ? ? ?Patient Goals and CMS Choice ?  ?  ?  ? ?Expected Discharge Plan and Services ?Expected Discharge Plan:  (TBD) ?  ?  ?Post Acute Care Choice:  (TBD) ?Living arrangements for the past 2 months: Apartment ?                ?  ?  ?  ?  ?  ?  ?  ?  ?  ?  ? ?Prior Living Arrangements/Services ?Living arrangements for the past 2 months: Apartment ?Lives with:: Siblings ?Patient language and need for interpreter reviewed:: Yes ?Do you feel safe going back to the place where you live?: Yes      ?Need for Family Participation in Patient Care: Yes (Comment) ?Care giver support system in place?: Yes (comment) ?  ?Criminal Activity/Legal Involvement Pertinent to Current Situation/Hospitalization: No - Comment as needed ? ?Activities of Daily Living ?Home Assistive Devices/Equipment:  None ?ADL Screening (condition at time of admission) ?Patient's cognitive ability adequate to safely complete daily activities?: Yes ?Is the patient deaf or have difficulty hearing?: No ?Does the patient have difficulty seeing, even when wearing glasses/contacts?: No ?Does the patient have difficulty concentrating, remembering, or making decisions?: Yes ?Patient able to express need for assistance with ADLs?: Yes ?Does the patient have difficulty dressing or bathing?: Yes ?Independently performs ADLs?: No ?Communication: Independent ?Dressing (OT): Needs assistance ?Is this a change from baseline?: Change from baseline, expected to last >3 days ?Grooming: Needs assistance ?Is this a change from baseline?: Change from baseline, expected to last >3 days ?Feeding: Needs assistance ?Is this a change from baseline?: Change from baseline, expected to last >3 days ?Bathing: Needs assistance ?Is this a change from baseline?: Change from baseline, expected to last >3 days ?Toileting: Needs assistance ?Is this a change from baseline?: Change from baseline, expected to last >3days ?In/Out Bed: Needs assistance ?Is this a change from baseline?: Change from baseline, expected to last >3 days ?Walks in Home: Needs assistance ?Is this a change from baseline?: Change from baseline, expected to last >3 days ?Does the patient have difficulty walking or climbing stairs?: Yes ?Weakness of Legs: Both ?Weakness of Arms/Hands: None ? ?Permission Sought/Granted ?  ?  ?   ?   ?   ?   ? ?  Emotional Assessment ?Appearance:: Appears stated age ?Attitude/Demeanor/Rapport: Engaged ?Affect (typically observed): Flat, Withdrawn ?Orientation: : Oriented to Self, Oriented to Place, Oriented to  Time ?Alcohol / Substance Use: Not Applicable ?Psych Involvement: No (comment) ? ?Admission diagnosis:  Weakness [R53.1] ?CVA (cerebral vascular accident) [facility]) [I63.9] ?Elevated troponin [R77.8] ?Cerebrovascular accident (CVA), unspecified mechanism ([place])  [I63.9] ?Patient Active Problem List  ? Diagnosis Date Noted  ? Acute pulmonary embolism ([place]) 04/25/2021  ? Elevated troponin   ? CVA (cerebral vascular accident) ([place]) 04/23/2021  ? Seizure disorder ([place]) 09/16/2018  ? HTN (hypertension) 09/14/2018  ? TIA (transient ischemic attack) 09/14/2018  ? Right sided weakness 09/14/2018  ? Stroke ([place]) 01/27/2018  ? Ataxia 01/26/2018  ? Hypertensive urgency 01/26/2018  ? Diabetes mellitus type II, non insulin dependent ([place]) 01/26/2018  ? Hypokalemia 01/26/2018  ? 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 ?PCP:  Glendon Axe, MD ?Pharmacy:   ?[id number] [place] - [address] ?[address] ?[id number]?Phone: [phone]: [phone]? ? ? ? ?Social Determinants of Health (SDOH) Interventions ?  ? ?Readmission Risk Interventions ?   ? View : No data to display.  ?  ?  ?  ? ? ? ?

## 2021-04-26 NOTE — Assessment & Plan Note (Signed)
BP remains elevated on amlodipine 2.5 mg. ?-- Increase amlodipine to 5 mg daily ?-- As needed oral hydralazine for SBP >160 ?

## 2021-04-26 NOTE — Assessment & Plan Note (Addendum)
As outlined under stroke. ?MRI brain negative for acute stroke. ?

## 2021-04-26 NOTE — Assessment & Plan Note (Signed)
Hold metformin. ?CBGs are controlled without insulin coverage. ?Monitor CBGs and add sliding scale NovoLog if consistently above 180. ?

## 2021-04-26 NOTE — Assessment & Plan Note (Signed)
Patient presented with profound fatigue and generalized weakness, failure to thrive x3 days.  There was concern for acute stroke but MRI was negative and patient did not have any new focal neurologic deficits.  ?CTA chest showed pulmonary emboli in the distal right pulmonary artery, proximal right upper middle and lower lobe pulmonary artery branches.  No evidence of right heart strain. ?Lower extremity Doppler ultrasound negative for DVTs bilaterally. ?Started on heparin drip.   ?Transitioned to Eliquis. ?-- Continue Eliquis 10 mg PO BID x 7 days, then 5 mg PO BID ?

## 2021-04-26 NOTE — Progress Notes (Signed)
Inpatient Rehabilitation Admissions Coordinator  ? ?Asked by SW to consider for CIR admit for family do not want SNF. I will place a rehab consult per protocol and an Admissions Coordinator with complete full assessment. ? ?Danne Baxter, RN, MSN ?Rehab Admissions Coordinator ?[phone] ?04/26/2021 1:31 PM ? ?

## 2021-04-26 NOTE — Progress Notes (Signed)
? ?Progress Note ? ?Patient Name: [name] ?Date of Encounter: 04/26/2021 ? ?[facility] Cardiologist: New ? ?Subjective  ? ?A&Ox2 on exam. Denies pain.  ? ?Inpatient Medications  ?  ?Scheduled Meds: ?  stroke: mapping our early stages of recovery book   Does not apply Once  ? amLODipine  2.5 mg Oral Daily  ? apixaban  10 mg Oral BID  ? Followed by  ? [START ON 05/02/2021] apixaban  5 mg Oral BID  ? aspirin EC  81 mg Oral Daily  ? atorvastatin  20 mg Oral q1800  ? levETIRAcetam  500 mg Oral BID  ? ?Continuous Infusions: ? ?PRN Meds: ?acetaminophen **OR** acetaminophen (TYLENOL) oral liquid 160 mg/5 mL **OR** acetaminophen, senna-docusate  ? ?Vital Signs  ?  ?Vitals:  ? 04/25/21 1930 04/25/21 2334 04/26/21 0449 04/26/21 0723  ?BP: (!) 146/75 (!) 186/91 (!) 146/83 (!) 150/78  ?Pulse: 69 70 68 65  ?Resp: 20 18 18 19   ?Temp: 98.5 ?F (36.9 ?C) 98.3 ?F (36.8 ?C) 98.6 ?F (37 ?C) 98.4 ?F (36.9 ?C)  ?TempSrc:      ?SpO2: 100% 100% 98% 97%  ?Weight:      ?Height:      ? ? ?Intake/Output Summary (Last 24 hours) at 04/26/2021 0729 ?Last data filed at 04/26/2021 0500 ?Gross per 24 hour  ?Intake 154.97 ml  ?Output 2150 ml  ?Net -1995.03 ml  ? ? ?  04/24/2021  ?  2:15 PM 04/23/2021  ?  8:11 PM 07/12/2019  ?  1:08 PM  ?Last 3 Weights  ?Weight (lbs) 158 lb 14.4 oz 165 lb 155 lb  ?Weight (kg) 72.077 kg 74.844 kg 70.308 kg  ?   ? ?Telemetry  ?  ?NSR, Hr 60s - Personally Reviewed ? ?ECG  ?  ?No new - Personally Reviewed ? ?Physical Exam  ? ?GEN: No acute distress.   ?Neck: No JVD ?Cardiac: RRR, no murmurs, rubs, or gallops.  ?Respiratory: Clear to auscultation bilaterally. ?GI: Soft, nontender, non-distended  ?MS: No edema; No deformity. ?Neuro:  A&Ox2 ?Psych: Normal affect  ? ?Labs  ?  ?High Sensitivity Troponin:   ?Recent Labs  ?Lab 04/23/21 ?1657 04/23/21 ?1903 04/24/21 ?2725 04/24/21 ?0308  ?TROPONINIHS 65* 80* 65* 64*  ?   ?Chemistry ?Recent Labs  ?Lab 04/23/21 ?1657 04/24/21 ?3664 04/25/21 ?0353 04/26/21 ?4034  ?NA 134*  --  134*  133*  ?K 3.4*  --  3.5 3.9  ?CL 99  --  101 102  ?CO2 26  --  26 23  ?GLUCOSE 146*  --  67* 124*  ?BUN 19  --  20 15  ?CREATININE 0.67  --  0.86 0.62  ?CALCIUM 8.7*  --  8.5* 8.6*  ?MG  --  2.0 2.0 2.0  ?GFRNONAA >60  --  >60 >60  ?ANIONGAP 9  --  7 8  ?  ?Lipids  ?Recent Labs  ?Lab 04/24/21 ?7425  ?CHOL 82  ?TRIG 70  ?HDL 35*  ?[place] 33  ?CHOLHDL 2.3  ?  ?Hematology ?Recent Labs  ?Lab 04/24/21 ?0308 04/25/21 ?9563 04/26/21 ?8756  ?WBC 7.0 5.5 5.9  ?RBC 5.02 4.74 4.85  ?HGB 14.4 13.8 14.5  ?HCT 44.9 41.6 41.8  ?MCV 89.4 87.8 86.2  ?MCH 28.7 29.1 29.9  ?MCHC 32.1 33.2 34.7  ?RDW 12.5 12.1 11.9  ?PLT 163 182 194  ? ?Thyroid No results for input(s): TSH, FREET4 in the last 168 hours.  ?BNPNo results for input(s): BNP, PROBNP in the last  168 hours.  ?DDimer  ?Recent Labs  ?Lab 04/24/21 ?0701  ?DDIMER 1.05*  ?  ? ?Radiology  ?  ?CT Angio Chest Pulmonary Embolism (PE) W or WO Contrast ? ?Result Date: 04/24/2021 ?CLINICAL DATA:  Patient from home complains of weakness for 3 days. EXAM: CT ANGIOGRAPHY CHEST WITH CONTRAST TECHNIQUE: Multidetector CT imaging of the chest was performed using the standard protocol during bolus administration of intravenous contrast. Multiplanar CT image reconstructions and MIPs were obtained to evaluate the vascular anatomy. RADIATION DOSE REDUCTION: This exam was performed according to the departmental dose-optimization program which includes automated exposure control, adjustment of the mA and/or kV according to patient size and/or use of iterative reconstruction technique. CONTRAST:  23mL OMNIPAQUE IOHEXOL 350 MG/ML SOLN COMPARISON:  None. FINDINGS: Cardiovascular: Satisfactory opacification of the pulmonary arteries to the segmental level. There is filling defect in the distal right pulmonary artery, proximal right upper, right middle and lower lobe pulmonary artery branches. There is also suggestion of embolus in the SVC. Normal heart size. No pericardial effusion. Coronary artery  atherosclerotic calcifications. No evidence of right heart strain. Mediastinum/Nodes: No enlarged mediastinal, hilar, or axillary lymph nodes. Thyroid gland, trachea, and esophagus demonstrate no significant findings. Lungs/Pleura: There are few peripheral triangular opacities in the dependent right lower lobe which may represent atelectasis or small pulmonary infarcts due to embolism. Mild centrilobular emphysematous changes prominent in the upper lobes. Upper Abdomen: No acute abnormality. Musculoskeletal: No chest wall abnormality. No acute or significant osseous findings. Review of the MIP images confirms the above findings. IMPRESSION: 1. Pulmonary embolism in the distal right pulmonary artery, as well as in the proximal right upper middle and lower lobe pulmonary artery branches. No evidence of right heart strain. 2. Emphysematous changes prominent in the upper lobes. Few small triangular peripheral opacities in the right lower lobe, which may represent atelectasis or small pulmonary infarct due to embolism. Findings of pulmonary embolism were reported to Dr. Starleen Blue at approximately 10:20 a.m. on 04/24/2021. Aortic Atherosclerosis (ICD10-I70.0) and Emphysema (ICD10-J43.9). Electronically Signed   By: Keane Police D.O.   On: 04/24/2021 10:26  ? ?US Venous Img Lower Bilateral (DVT) ? ?Result Date: 04/25/2021 ?CLINICAL DATA:  Pulmonary embolism.  Lower extremity edema. EXAM: BILATERAL LOWER EXTREMITY VENOUS DOPPLER ULTRASOUND TECHNIQUE: Gray-scale sonography with graded compression, as well as color Doppler and duplex ultrasound were performed to evaluate the lower extremity deep venous systems from the level of the common femoral vein and including the common femoral, femoral, profunda femoral, popliteal and calf veins including the posterior tibial, peroneal and gastrocnemius veins when visible. The superficial great saphenous vein was also interrogated. Spectral Doppler was utilized to evaluate flow at rest and  with distal augmentation maneuvers in the common femoral, femoral and popliteal veins. COMPARISON:  None. FINDINGS: RIGHT LOWER EXTREMITY Common Femoral Vein: No evidence of thrombus. Normal compressibility, respiratory phasicity and response to augmentation. Saphenofemoral Junction: No evidence of thrombus. Normal compressibility and flow on color Doppler imaging. Profunda Femoral Vein: No evidence of thrombus. Normal compressibility and flow on color Doppler imaging. Femoral Vein: No evidence of thrombus. Normal compressibility, respiratory phasicity and response to augmentation. Popliteal Vein: No evidence of thrombus. Normal compressibility, respiratory phasicity and response to augmentation. Calf Veins: No evidence of thrombus. Normal compressibility and flow on color Doppler imaging. Superficial Great Saphenous Vein: No evidence of thrombus. Normal compressibility. Venous Reflux:  None. Other Findings: No evidence of superficial thrombophlebitis or abnormal fluid collection. LEFT LOWER EXTREMITY Common Femoral Vein: No evidence  of thrombus. Normal compressibility, respiratory phasicity and response to augmentation. Saphenofemoral Junction: No evidence of thrombus. Normal compressibility and flow on color Doppler imaging. Profunda Femoral Vein: No evidence of thrombus. Normal compressibility and flow on color Doppler imaging. Femoral Vein: No evidence of thrombus. Normal compressibility, respiratory phasicity and response to augmentation. Popliteal Vein: No evidence of thrombus. Normal compressibility, respiratory phasicity and response to augmentation. Calf Veins: No evidence of thrombus. Normal compressibility and flow on color Doppler imaging. Superficial Great Saphenous Vein: No evidence of thrombus. Normal compressibility. Venous Reflux:  None. Other Findings: No evidence of superficial thrombophlebitis or abnormal fluid collection. IMPRESSION: No evidence of deep venous thrombosis in either lower  extremity. Electronically Signed   By: Aletta Edouard M.D.   On: 04/25/2021 14:44   ? ?Cardiac Studies  ? ?Echo results pending ? ?Echo 2020 ? 1. The left ventricle has normal systolic function, with an ejection  ?fr

## 2021-04-26 NOTE — Assessment & Plan Note (Signed)
PT and OT recommend SNF for rehab.  Patient's sister requested consideration for CIR, referral was placed by TOC.  If not accepted by CIR, prefer home with home health, declined SNF placement. ?--Continue PT and OT. ?-- Out of bed to chair ?-- Mobilize frequently as tolerated ?-- Fall precautions ?

## 2021-04-26 NOTE — Progress Notes (Signed)
Patient's right great toe has opening on the top of it that is draining an odorous, green and yellow drainage. Notified MD. ?

## 2021-04-26 NOTE — Assessment & Plan Note (Addendum)
History of R great toe amputation due to osteomyelitis.  Hx of PAD.   ?Right foot xray without signs of osteo (4/4). ?Wound noted to be open and malodorous, no purulent drainage seen on my exam, serosanguinous drainage on gauze dressing.  MRI confirmed osteomyelitis. ?--See osteomyelitis ?--WOC consulted ?-- Continue wound care per instructions ?-- See osteomyelitis ?

## 2021-04-26 NOTE — Progress Notes (Signed)
Inpatient Rehab Admissions Coordinator:  ? ? I discussed potential CIR admit with pt. And sister over the phone. They are in agreement and state that pt. Can return to live with brother [name] who can provide 24/7 support (I have placed a call to [name] to confirm this as well). I will follow for potential admit pending medical readiness.  ? ?Clemens Catholic, MS, CCC-SLP ?Rehab Admissions Coordinator  ?[phone] (celll) ?[phone] (office) ? ?

## 2021-04-26 NOTE — Progress Notes (Signed)
?Progress Note ? ? ?Patient: [name] YCX:[id number] DOB: 02-11-1960 DOA: 04/23/2021     3 ?DOS: the patient was seen and examined on 04/26/2021 ?  ?Brief hospital course: ?Pritesh Sobecki is a 61 y.o. male with medical history significant of hypertension, hyperlipidemia, diabetes mellitus, CVA 2020, osteomyelitis s/p  right 2nd s/p great toe amputation who presented to the ED via EMS with complaints of worsening generalized weakness for 2-3 days. EMS reported found patient laying in urine soaked clothing.  Patient reported no acute complaints other than profound fatigue.  In the ED BP was 164/91 with other vitals normal.  ECG without acute ischemic changes.  Labs with mild hypokalemia and hs-troponin mildly elevated.   ? ?CT head showed new subacute to chronic right cerebellar infarct and chronic small vessel ischemic disease and generalized atrophy.   ? ?Subsequent CTA chest revealed bilateral pulmonary emboli with no evidence of right heart strain.  Started on heparin infusion and has since been transitioned to Eliquis (started 4/3 PM).  Lower extremity doppler U/S negative for DVT's. ? ?Assessment and Plan: ?* Acute pulmonary embolism ([place]) ?Patient presented with profound fatigue and generalized weakness, failure to thrive x3 days.  There was concern for acute stroke but MRI was negative and patient did not have any new focal neurologic deficits.  ?CTA chest showed pulmonary emboli in the distal right pulmonary artery, proximal right upper middle and lower lobe pulmonary artery branches.  No evidence of right heart strain. ?Lower extremity Doppler ultrasound negative for DVTs bilaterally. ?Started on heparin drip.   ?Transitioned to Eliquis. ?-- Continue Eliquis 10 mg PO BID x 7 days, then 5 mg PO BID ? ?Generalized weakness ?PT and OT recommend SNF for rehab.  Patient's sister requested consideration for CIR, referral was placed by TOC.  If not accepted by CIR, prefer home with home health, declined SNF  placement. ?--Continue PT and OT. ?-- Out of bed to chair ?-- Mobilize frequently as tolerated ?-- Fall precautions ? ?Open wound of right foot ?History of R great toe amputation due to osteomyelitis.  Hx of PAD.   ?Right foot xray without signs of osteo (4/4). ?Wound is open and malodorous, no purulent drainage seen on my exam, serosanguinous drainage on gauze dressing. ?--R foot MRI if patient agreeable ?--Defer antibiotics for now given no fevers or leukocytosis ?--Monitor clinically ?--WOC consult ? ?Elevated troponin ?No active chest pain or acute ischemic changes on EKG.  Cardiology consulted. ?Due to demand ischemia in the setting of acute pulmonary emboli. ?-- Per cardiology, okay off aspirin while on Eliquis ?-- Follow-up in cardiology clinic in 3 to 4 weeks ?-- Outpatient ischemic evaluation recommended ?-- Continue statin ? ?CVA (cerebral vascular accident) ([place]) ?As outlined under stroke. ?MRI brain negative for acute stroke. ? ?Seizure disorder ([place]) ?Continue Keppra ? ?HTN (hypertension) ?BP remains elevated on amlodipine 2.5 mg. ?-- Increase amlodipine to 5 mg daily ?-- As needed oral hydralazine for SBP >160 ? ?Stroke [facility]) ?Hx of CVA 2020.   ?Head CT on admission with subacute R cerebellar infarct, but MRI negative for acute CVA.   ?--Neurology consulted ?--Continue Eliquis, Lipitor ?--Stop ASA while on Eliquis, resume when off anticoagulation ?--PT/OT/SLP recommend SNF, family requesting CIR - referral placed for evaluation ? ?Diabetes mellitus type II, non insulin dependent ([place]) ?Hold metformin. ?CBGs are controlled without insulin coverage. ?Monitor CBGs and add sliding scale NovoLog if consistently above 180. ? ? ? ? ?  ? ?Subjective: Patient awake resting in bed when seen  today.  Staff reports patient has been mildly confused.  For me, he reports feeling tired but denies any other acute complaints including pain in his right foot.  Denies fevers or chills or feeling sick. ? ?Physical  Exam: ?Vitals:  ? 04/26/21 0449 04/26/21 0723 04/26/21 1144 04/26/21 1553  ?BP: (!) 146/83 (!) 150/78 (!) 145/77 (!) 144/76  ?Pulse: 68 65 68 71  ?Resp: 18 19 17 18   ?Temp: 98.6 ?F (37 ?C) 98.4 ?F (36.9 ?C) 97.8 ?F (36.6 ?C) 97.9 ?F (36.6 ?C)  ?TempSrc:    Oral  ?SpO2: 98% 97% 99% 97%  ?Weight:      ?Height:      ? ?General exam: awake, alert, no acute distress ?HEENT: moist mucus membranes, hearing grossly normal  ?Respiratory system: CTAB generally diminished, no wheezes, rales or rhonchi, normal respiratory effort. ?Cardiovascular system: normal S1/S2, RRR, no JVD, murmurs, rubs, gallops, no pedal edema.   ?Gastrointestinal system: soft, nontender abdomen. ?Central nervous system: A&O x3. no gross focal neurologic deficits, normal speech ?Extremities: Status post right great toe amputation with open wound at the site draining serosanguineous malodorous fluid, no surroundingf erythema but mild swelling, no edema, normal tone ?Skin: dry, intact, normal temperature ?Psychiatry: normal mood, congruent affect ? ? ?Data Reviewed: ? ?Labs reviewed and notable for sodium 133, glucose 124, calcium 8.6.  CBC entirely normal. ? ?Echocardiogram 4/4 -normal EF 60 to 65%, mild LVH, grade 1 diastolic dysfunction, normal RV systolic function mild AR. ? ?Family Communication: None at bedside, will attempt to call ? ?Disposition: ?Status is: Inpatient ?Remains inpatient appropriate because: Severity of illness with ongoing evaluation of right great toe wound suspicious for infection with history of osteomyelitis.  Rehab recommended for discharge, CIR referral pending. ? ? Planned Discharge Destination:  CIR versus home with home health ? ? ? ?Time spent: 35 minutes ? ?Author: ?Ezekiel Slocumb, DO ?04/26/2021 4:38 PM ? ?For on call review [email or web address].  ?

## 2021-04-26 NOTE — Assessment & Plan Note (Signed)
Hx of?CVA?2020.   ?Head CT on admission with subacute R cerebellar infarct, but MRI negative for acute CVA.   ?--Neurology consulted ?--Continue Eliquis, Lipitor ?--Stop ASA while on Eliquis, resume when off anticoagulation ?--PT/OT/SLP recommend SNF, family requesting CIR - referral placed for evaluation ?

## 2021-04-26 NOTE — Progress Notes (Signed)
*  PRELIMINARY RESULTS* ?Echocardiogram ?2D Echocardiogram has been performed. ? ?Mathan Darroch, [place] ?04/26/2021, 9:26 AM ?

## 2021-04-26 NOTE — Assessment & Plan Note (Signed)
No active chest pain or acute ischemic changes on EKG.  Cardiology consulted. ?Due to demand ischemia in the setting of acute pulmonary emboli. ?-- Per cardiology, okay off aspirin while on Eliquis ?-- Follow-up in cardiology clinic in 3 to 4 weeks ?-- Outpatient ischemic evaluation recommended ?-- Continue statin ?

## 2021-04-26 NOTE — Assessment & Plan Note (Signed)
Continue Keppra.

## 2021-04-26 NOTE — Hospital Course (Signed)
[name] is a 61 y.o. male with medical history significant of hypertension, hyperlipidemia, diabetes mellitus, CVA 2020, osteomyelitis s/p  right 2nd s/p great toe amputation who presented to the ED via EMS with complaints of worsening generalized weakness for 2-3 days. EMS reported found patient laying in urine soaked clothing.  Patient reported no acute complaints other than profound fatigue.  In the ED BP was 164/91 with other vitals normal.  ECG without acute ischemic changes.  Labs with mild hypokalemia and hs-troponin mildly elevated.   ? ?CT head showed new subacute to chronic right cerebellar infarct and chronic small vessel ischemic disease and generalized atrophy.   ? ?Subsequent CTA chest revealed bilateral pulmonary emboli with no evidence of right heart strain.  Started on heparin infusion and has since been transitioned to Eliquis (started 4/3 PM).  Lower extremity doppler U/S negative for DVT's. ?

## 2021-04-27 ENCOUNTER — Inpatient Hospital Stay: Payer: Medicare Other

## 2021-04-27 DIAGNOSIS — M869 Osteomyelitis, unspecified: Secondary | ICD-10-CM | POA: Diagnosis present

## 2021-04-27 DIAGNOSIS — I2699 Other pulmonary embolism without acute cor pulmonale: Secondary | ICD-10-CM | POA: Diagnosis not present

## 2021-04-27 DIAGNOSIS — S81801A Unspecified open wound, right lower leg, initial encounter: Secondary | ICD-10-CM

## 2021-04-27 DIAGNOSIS — I739 Peripheral vascular disease, unspecified: Secondary | ICD-10-CM

## 2021-04-27 LAB — BASIC METABOLIC PANEL
Anion gap: 7 (ref 5–15)
BUN: 14 mg/dL (ref 6–20)
CO2: 26 mmol/L (ref 22–32)
Calcium: 8.8 mg/dL — ABNORMAL LOW (ref 8.9–10.3)
Chloride: 101 mmol/L (ref 98–111)
Creatinine, Ser: 0.82 mg/dL (ref 0.61–1.24)
GFR, Estimated: >60 mL/min (ref >60)
Glucose, Bld: 102 mg/dL — ABNORMAL HIGH (ref 70–99)
Potassium: 3.6 mmol/L (ref 3.5–5.1)
Sodium: 134 mmol/L — ABNORMAL LOW (ref 135–145)

## 2021-04-27 LAB — MAGNESIUM: Magnesium: 2 mg/dL (ref 1.7–2.4)

## 2021-04-27 LAB — GLUCOSE, CAPILLARY
Glucose-Capillary: 92 mg/dL (ref 70–99)
Glucose-Capillary: 104 mg/dL — ABNORMAL HIGH (ref 70–99)
Glucose-Capillary: 185 mg/dL — ABNORMAL HIGH (ref 70–99)

## 2021-04-27 LAB — CBC
HCT: 42 % (ref 39.0–52.0)
Hemoglobin: 14 g/dL (ref 13.0–17.0)
MCH: 29 pg (ref 26.0–34.0)
MCHC: 33.3 g/dL (ref 30.0–36.0)
MCV: 87 fL (ref 80.0–100.0)
Platelets: 202 10*3/uL (ref 150–400)
RBC: 4.83 MIL/uL (ref 4.22–5.81)
RDW: 11.9 % (ref 11.5–15.5)
WBC: 5.2 10*3/uL (ref 4.0–10.5)
nRBC: 0 % (ref 0.0–0.2)

## 2021-04-27 LAB — PHOSPHORUS: Phosphorus: 3.8 mg/dL (ref 2.5–4.6)

## 2021-04-27 MED ORDER — AMLODIPINE BESYLATE 10 MG PO TABS
10.0000 mg | ORAL_TABLET | Freq: Every day | ORAL | Status: DC
Start: 2021-04-27 — End: 2021-05-06
  Administered 2021-04-27 – 2021-05-06 (×9): 10 mg via ORAL
  Filled 2021-04-27 (×8): qty 1

## 2021-04-27 NOTE — Progress Notes (Signed)
Inpatient Rehab Admissions Coordinator:  ? ?I am following for potential CIR admission. Per chart, MRI revealing for osteomyelitis, Pt. Now awaiting podiatry consult. Pt. Is not yet ready for CIR. I will follow for potential admit pending medical readiness. Additionally, I spoke with pt.s' brother [name] this AM who reports that he can provide 25/7 supervision to very light assist after Cir.  ? ?Clemens Catholic, MS, CCC-SLP ?Rehab Admissions Coordinator  ?[phone] (celll) ?[phone] (office) ? ? ?

## 2021-04-27 NOTE — Assessment & Plan Note (Addendum)
History of prior right great toe amputation due to osteomyelitis in the setting of PAD.  Patient  Has open wound at the site, foul odor and localized swelling.  MRI shows osteomyelitis of the first metatarsal head. ?4/7: underwent angiography with Dr. Lucky Cowboy. Further revascularization is needed given findings of "..occlusion of the distal popliteal artery, occlusion of the tibioperoneal trunk, and occlusion of all 3 tibial vessels proximally with the posterior tibial artery being the only runoff to the foot distally after reconstitution in the proximal segment." ?4/10: underwent RLE angiogram with angioplasty to R posterior tibial artery, tibioperoneal trunk, and popliteal artery.  Stent placed in popliteal artery. ? ?--Consults: Podiatry, Vascular surgery, Infectious disease ?--Likely 1st ray amputation ?--No fever or leukocytosis, hold off antibiotics for now.  ID following   ?--Follow wound culture ?--Weightbearing as tolerated on the right foot in surgical shoe ?
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
## 2021-04-27 NOTE — Progress Notes (Signed)
?Progress Note ? ? ?Patient: [name] YTK:[id number] DOB: 04-18-1960 DOA: 04/23/2021     4 ?DOS: the patient was seen and examined on 04/27/2021 ?  ?Brief hospital course: ?[name] is a 61 y.o. male with medical history significant of hypertension, hyperlipidemia, diabetes mellitus, CVA 2020, osteomyelitis s/p  right 2nd s/p great toe amputation who presented to the ED via EMS with complaints of worsening generalized weakness for 2-3 days. EMS reported found patient laying in urine soaked clothing.  Patient reported no acute complaints other than profound fatigue.  In the ED BP was 164/91 with other vitals normal.  ECG without acute ischemic changes.  Labs with mild hypokalemia and hs-troponin mildly elevated.   ? ?CT head showed new subacute to chronic right cerebellar infarct and chronic small vessel ischemic disease and generalized atrophy.   ? ?Subsequent CTA chest revealed bilateral pulmonary emboli with no evidence of right heart strain.  Started on heparin infusion and has since been transitioned to Eliquis (started 4/3 PM).  Lower extremity doppler U/S negative for DVT's. ? ?Assessment and Plan: ?* Acute pulmonary embolism ([name]) ?Patient presented with profound fatigue and generalized weakness, failure to thrive x3 days.  There was concern for acute stroke but MRI was negative and patient did not have any new focal neurologic deficits.  ?CTA chest showed pulmonary emboli in the distal right pulmonary artery, proximal right upper middle and lower lobe pulmonary artery branches.  No evidence of right heart strain. ?Lower extremity Doppler ultrasound negative for DVTs bilaterally. ?Started on heparin drip.   ?Transitioned to Eliquis. ?-- Continue Eliquis 10 mg PO BID x 7 days, then 5 mg PO BID ? ?Foot osteomyelitis, right ([name]) ?History of prior right great toe amputation due to osteomyelitis in the setting of PAD.  Patient  Has open wound at the site, foul odor and localized swelling.  MRI shows  osteomyelitis of the first metatarsal head. ?-- Podiatry and vascular surgery consulted ?--No fever or leukocytosis, hold off antibiotics for now ?--Anticipate need for revision amputation ?--Follow wound culture ?-- Weightbearing as tolerated on the right foot in surgical shoe ? ?Generalized weakness ?PT and OT recommend SNF for rehab.  Patient's sister requested consideration for CIR, referral was placed by TOC.  If not accepted by CIR, prefer home with home health, declined SNF placement. ?--Continue PT and OT. ?-- Out of bed to chair ?-- Mobilize frequently as tolerated ?-- Fall precautions ? ?Open wound of right foot ?History of R great toe amputation due to osteomyelitis.  Hx of PAD.   ?Right foot xray without signs of osteo (4/4). ?Wound is open and malodorous, no purulent drainage seen on my exam, serosanguinous drainage on gauze dressing. ?MRI confirmed osteomyelitis. ?--WOC consulted ?-- Continue wound care per instructions ?-- See osteomyelitis ? ?Elevated troponin ?No active chest pain or acute ischemic changes on EKG.  Cardiology consulted. ?Due to demand ischemia in the setting of acute pulmonary emboli. ?-- Per cardiology, okay off aspirin while on Eliquis ?-- Follow-up in cardiology clinic in 3 to 4 weeks ?-- Outpatient ischemic evaluation recommended ?-- Continue statin ? ?CVA (cerebral vascular accident) ([name]) ?As outlined under stroke. ?MRI brain negative for acute stroke. ? ?Seizure disorder ([name]) ?Continue Keppra ? ?HTN (hypertension) ?BP remains elevated on amlodipine 2.5 mg. ?-- Increase amlodipine to 5 mg daily ?-- As needed oral hydralazine for SBP >160 ? ?Stroke [facility]) ?Hx of CVA 2020.   ?Head CT on admission with subacute R cerebellar infarct, but MRI negative for  acute CVA.   ?--Neurology consulted ?--Continue Eliquis, Lipitor ?--Stop ASA while on Eliquis, resume when off anticoagulation ?--PT/OT/SLP recommend SNF, family requesting CIR - referral placed for evaluation ? ?Diabetes mellitus  type II, non insulin dependent ([name]) ?Hold metformin. ?CBGs are controlled without insulin coverage. ?Monitor CBGs and add sliding scale NovoLog if consistently above 180. ? ? ? ? ?  ? ?Subjective: Patient was awake resting in bed when seen today.  He reported feeling okay.  Denied any fevers or chills, pain in his foot or any other acute complaints.  Discussed the infection in the bone of his foot, possible need for further surgery.  He expressed understanding, hope surgery will be necessary. ? ?Physical Exam: ?Vitals:  ? 04/27/21 1238 04/27/21 1516 04/27/21 1835 04/27/21 1939  ?BP: (!) 146/76 132/71 118/82 138/75  ?Pulse: 69 70 68 69  ?Resp: 16 14 16 18   ?Temp: 98 ?F (36.7 ?C) 97.9 ?F (36.6 ?C) 98.7 ?F (37.1 ?C) 98.2 ?F (36.8 ?C)  ?TempSrc:   Oral Oral  ?SpO2: 99% 100% 99% 100%  ?Weight:      ?Height:      ? ?General exam: awake, alert, no acute distress ?HEENT: moist mucus membranes, hearing grossly normal  ?Respiratory system: CTAB, no wheezes, rales or rhonchi, normal respiratory effort. ?Cardiovascular system: normal S1/S2, RRR, no JVD, murmurs, rubs, gallops, no pedal edema.   ?Central nervous system: no gross focal neurologic deficits, normal speech ?Extremities: Clean dry intact dressing on right foot, no edema, normal tone ?Skin: dry, intact, normal temperature ?Psychiatry: normal mood, congruent affect ? ? ?Data Reviewed: ? ?Labs reviewed and notable for sodium 134, glucose 102, calcium 8.8. ? ?MRI right foot impression: ?1. Open wound along the great toe amputation site with surrounding ?cellulitis. ?2. Osteomyelitis involving the first metatarsal head. ?3. Diffuse myofasciitis without definite findings for pyomyositis. ? ? ? ?Family Communication: None at bedside on rounds, will attempt to call as time allows ? ?Disposition: ?Status is: Inpatient ?Remains inpatient appropriate because: Severity of illness with ongoing evaluation not appropriate for the outpatient setting.  Procedure/surgery planned for  right foot osteomyelitis as outlined above ? ? Planned Discharge Destination:  CIR versus home with home health ? ? ? ?Time spent: 35 minutes ? ?Author: ?Ezekiel Slocumb, DO ?04/27/2021 8:19 PM ? ?For on call review [email or web address].  ?

## 2021-04-27 NOTE — Consult Note (Signed)
[facility] Nurse Consult Note: ?Reason for Consult: open wound right great toe, s/p amputation in 2016 ?MRI indicates +osteomyelitis chronic  ?Wound type: non healing surgical wound in the presence of DM and PAd ?Pressure Injury POA: NA ?Measurement:0.2cm x 0.4cm x 0.5cm with tunneling at 9 o'clock  ?Wound bed: pale ?Drainage (amount, consistency, odor) minimal on dressing, brown, with odor ?Periwound: intact  ?Dressing procedure/placement/frequency: ?Silver hydrofiber packed into tunneled area and left with wick, top with foam. Change every other day ? ?Follow up with podiatrist  as an outpatient, appears not follow up since 2022.  ? ?Discussed POC with patient and bedside nurse.  ?Re consult if needed, will not follow at this time. ?Thanks ? Rachelle Edwards [facility] MSN, RN,CWOCN, CNS, CWON-AP [phone])  ? ?  ?

## 2021-04-27 NOTE — PMR Pre-admission (Signed)
PMR Admission Coordinator Pre-Admission Assessment ? ?Patient: [name] is an 61 y.o., male ?MRN: [id number] ?DOB: 1960-10-06 ?Height: 5\' 7"  (170.2 cm) ?Weight: 72.1 kg ? ?Insurance Information ?HMO:     PPO:      PCP:      IPA:      80/20: yes     OTHER:  ?PRIMARY: Medicare AB      Policy#: [id number]           Subscriber: Pt. ?Phone#: Verified online    Fax#:  ?Pre-Cert#:       Employer:  ?Benefits:  Phone #:      Name:  ?Eff. Date: Parts A ad B effective  02/23/21 Deduct: $1600      Out of Pocket Max:  None      Life Max: N/A  ?CIR: 100%      SNF: 100 days ?Outpatient: 80%     Co-Pay: 20% ?Home Health: 100%      Co-Pay: none ?DME: 80%     Co-Pay: 20% ?Providers: patient's choice ? ?SECONDARY: Medicaid of [place]      Policy#: [id number]           Phone#:  ? ?Financial Counselor:       Phone#:  ? ?The ?Data Collection Information Summary? for patients in Inpatient Rehabilitation Facilities with attached ?Privacy Act [place] Records? was provided and verbally reviewed with: Patient ? ?Emergency Contact Information ?Contact Information   ? ? Name Relation Home Work Mobile  ? Cropley,Clayton Father [phone]    ? Streiff,Tony Brother [phone]    ? cynthia, stainback Sister [phone]    ? ?  ? ? ?Current Medical History  ?Patient Admitting Diagnosis: PE, Debility, Subacute CVA ?History of Present Illness: [name] is a 61 y.o. male with medical history significant of hypertension, hyperlipidemia, diabetes mellitus, CVA 2020, osteomyelitis s/p  right 2nd, great toe amputation who presents to ed BIB EMS with complaints of generalized weakness for the las 2-3 days. In the ED BP was 164/91 with other vitals normal.  ECG without acute ischemic changes.  Labs with mild hypokalemia and hs-troponin mildly elevated.  Acute PE found on CT of the Chest. CT of the head showed subacute cerebellar infarct. Follow up MRI of the brain showed no acute CVA. Pt. Also noted to have open wound of the right foot.  X-ray of the foot showed no sign of osteomyelitis, MRI of that foot to be performed 4/5. Pt. Also noted to have elevated troponin during this admission, felt to be due to demand ischemia in the setting of acute pulmonary emboli. Pt. Also diagnosed wtih right foot wound/bone infection and is s/p RLE vascular procedures 4/7 and 05/02/21. Revascularization surgery and partial first ray revision amputation performed on 05/04/21.Pt. Was seen by PT and OT who recommended post acute rehab to assist return to PLOF.  ?  ? ?Patient's medical record from [facility] has been reviewed by the rehabilitation admission coordinator and physician. ? ?Past Medical History  ?Past Medical History:  ?Diagnosis Date  ? Diabetes mellitus without complication ([place])   ? Stroke [facility]) 01/2018  ? Per Brother   ? ? ?Has the patient had major surgery during 100 days prior to admission? No ? ?Family History   ?family history includes Diabetes in his brother. ? ?Current Medications ? ?Current Facility-Administered Medications:  ?   stroke: mapping our early stages of recovery book, , Does not apply, Once, Clance Boll, MD ?  acetaminophen (  TYLENOL) tablet 650 mg, 650 mg, Oral, Q4H PRN **OR** acetaminophen (TYLENOL) 160 MG/5ML solution 650 mg, 650 mg, Per Tube, Q4H PRN **OR** acetaminophen (TYLENOL) suppository 650 mg, 650 mg, Rectal, Q4H PRN, Clance Boll, MD ?  amLODipine (NORVASC) tablet 5 mg, 5 mg, Oral, Daily, [name] A, DO ?  apixaban (ELIQUIS) tablet 10 mg, 10 mg, Oral, BID, 10 mg at 04/26/21 2154 **FOLLOWED BY** [START ON 05/02/2021] apixaban (ELIQUIS) tablet 5 mg, 5 mg, Oral, BID, Val Riles, MD ?  atorvastatin (LIPITOR) tablet 20 mg, 20 mg, Oral, q1800, Val Riles, MD, 20 mg at 04/26/21 1647 ?  hydrALAZINE (APRESOLINE) tablet 25 mg, 25 mg, Oral, Q6H PRN, [name] A, DO ?  levETIRAcetam (KEPPRA) tablet 500 mg, 500 mg, Oral, BID, Myles Rosenthal A, MD, 500 mg at 04/26/21 2154 ?   senna-docusate (Senokot-S) tablet 1 tablet, 1 tablet, Oral, QHS PRN, Clance Boll, MD ? ?Patients Current Diet:  ?Diet Order   ? ?       ?  Diet heart healthy/carb modified Room service appropriate? Yes; Fluid consistency: Thin  Diet effective now       ?  ? ?  ?  ? ?  ? ? ?Precautions / Restrictions ?Precautions ?Precautions: Fall ?Restrictions ?Weight Bearing Restrictions: No  ? ?Has the patient had 2 or more falls or a fall with injury in the past year? No ? ?Prior Activity Level ?Limited Community (1-2x/wk): Pt. went out for appointments mostly ? ?Prior Functional Level ?Self Care: Did the patient need help bathing, dressing, using the toilet or eating? Independent ? ?Indoor Mobility: Did the patient need assistance with walking from room to room (with or without device)? Independent ? ?Stairs: Did the patient need assistance with internal or external stairs (with or without device)? Independent ? ?Functional Cognition: Did the patient need help planning regular tasks such as shopping or remembering to take medications? Independent ? ?Patient Information ?Are you of Hispanic, Latino/a,or Spanish origin?: A. No, not of Hispanic, Latino/a, or Spanish origin ?What is your race?: B. Black or African American ?Do you need or want an interpreter to communicate with a doctor or health care staff?: 1. Yes ? ?Patient's Response To:  ?Health Literacy and Transportation ?Is the patient able to respond to health literacy and transportation needs?: No ?Health Literacy - How often do you need to have someone help you when you read instructions, pamphlets, or other written material from your doctor or pharmacy?: Sometimes ?In the past 12 months, has lack of transportation kept you from medical appointments or from getting medications?: Yes ?In the past 12 months, has lack of transportation kept you from meetings, work, or from getting things needed for daily living?: Yes ? ?Home Assistive Devices / Equipment ?Home  Assistive Devices/Equipment: None ?Home Equipment: Conservation officer, nature (2 wheels) ? ?Prior Device Use: Indicate devices/aids used by the patient prior to current illness, exacerbation or injury? None of the above ? ?Current Functional Level ?Cognition ? Arousal/Alertness: Lethargic ?Overall Cognitive Status: No family/caregiver present to determine baseline cognitive functioning ?Orientation Level: Oriented X4 ?General Comments: A&Ox3- but follows commands well, decreased insight into deficits ?   ?Extremity Assessment ?(includes Sensation/Coordination) ? Upper Extremity Assessment: Generalized weakness  ?Lower Extremity Assessment: Generalized weakness  ?  ?ADLs ? Overall ADL's : Needs assistance/impaired ?General ADL Comments: Pt required significant time and effort to doff R sock and ultimately unable to donn R sock after. Pt overall requires MOD A for LB ADL, MOD-MAX A for  ADL transfers with RW, and MIN A for seated UB ADL tasks.  ?  ?Mobility ? Overal bed mobility: Needs Assistance ?Bed Mobility: Supine to Sit, Sit to Supine ?Supine to sit: Mod assist, Max assist, HOB elevated ?Sit to supine: Mod assist, Max assist ?General bed mobility comments: Mod A through B HHA to pull up into sitting, patient was able to move BLEs to EOB  ?  ?Transfers ? Overall transfer level: Needs assistance ?Equipment used: Rolling walker (2 wheels) ?Transfers: Sit to/from Stand ?Sit to Stand: Mod assist, Max assist ?General transfer comment: VC for anterior weight shift to prevent posterior LOB  ?  ?Ambulation / Gait / Stairs / Wheelchair Mobility ? Ambulation/Gait ?Ambulation/Gait assistance:  (deferred due to safety)  ?  ?Posture / Balance Dynamic Sitting Balance ?Sitting balance - Comments: SBA, no LOB ?Balance ?Overall balance assessment: Needs assistance ?Sitting-balance support: Bilateral upper extremity supported, Feet supported, Single extremity supported ?Sitting balance-Leahy Scale: Poor ?Sitting balance - Comments: SBA, no  LOB ?Postural control: Posterior lean ?Standing balance support: Bilateral upper extremity supported, During functional activity, Reliant on assistive device for balance ?Standing balance-Leahy Scale: Poor ?Standing bal
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
## 2021-04-28 DIAGNOSIS — M869 Osteomyelitis, unspecified: Secondary | ICD-10-CM | POA: Diagnosis not present

## 2021-04-28 DIAGNOSIS — M86671 Other chronic osteomyelitis, right ankle and foot: Secondary | ICD-10-CM | POA: Diagnosis not present

## 2021-04-28 DIAGNOSIS — E119 Type 2 diabetes mellitus without complications: Secondary | ICD-10-CM | POA: Diagnosis not present

## 2021-04-28 DIAGNOSIS — I639 Cerebral infarction, unspecified: Secondary | ICD-10-CM | POA: Diagnosis not present

## 2021-04-28 DIAGNOSIS — I2699 Other pulmonary embolism without acute cor pulmonale: Secondary | ICD-10-CM | POA: Diagnosis not present

## 2021-04-28 DIAGNOSIS — R531 Weakness: Secondary | ICD-10-CM | POA: Diagnosis not present

## 2021-04-28 LAB — CBC
HCT: 42.3 % (ref 39.0–52.0)
Hemoglobin: 14.1 g/dL (ref 13.0–17.0)
MCH: 29 pg (ref 26.0–34.0)
MCHC: 33.3 g/dL (ref 30.0–36.0)
MCV: 87 fL (ref 80.0–100.0)
Platelets: 206 10*3/uL (ref 150–400)
RBC: 4.86 MIL/uL (ref 4.22–5.81)
RDW: 11.9 % (ref 11.5–15.5)
WBC: 4.8 10*3/uL (ref 4.0–10.5)
nRBC: 0 % (ref 0.0–0.2)

## 2021-04-28 LAB — BASIC METABOLIC PANEL
Anion gap: 7 (ref 5–15)
BUN: 16 mg/dL (ref 6–20)
CO2: 26 mmol/L (ref 22–32)
Calcium: 9 mg/dL (ref 8.9–10.3)
Chloride: 101 mmol/L (ref 98–111)
Creatinine, Ser: 0.76 mg/dL (ref 0.61–1.24)
GFR, Estimated: >60 mL/min (ref >60)
Glucose, Bld: 125 mg/dL — ABNORMAL HIGH (ref 70–99)
Potassium: 3.7 mmol/L (ref 3.5–5.1)
Sodium: 134 mmol/L — ABNORMAL LOW (ref 135–145)

## 2021-04-28 LAB — GLUCOSE, CAPILLARY
Glucose-Capillary: 95 mg/dL (ref 70–99)
Glucose-Capillary: 136 mg/dL — ABNORMAL HIGH (ref 70–99)

## 2021-04-28 NOTE — Progress Notes (Signed)
?  Inpatient Rehabilitation Admissions Coordinator  ? ?I spoke with pt's sister by phone and she states she has heard from MD today with updates on his medical plan. We will follow up postop to assist. ? ? ?Danne Baxter, RN, MSN ?Rehab Admissions Coordinator ?[phone] ?04/28/2021 3:36 PM ? ?
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
## 2021-04-28 NOTE — Progress Notes (Signed)
Patient sister [name]; called and wanted and update on patient. Sister would like to speak with MD regarding patient having surgery, would like to voice her concerns. ?

## 2021-04-28 NOTE — Progress Notes (Signed)
?Progress Note ? ? ?Patient: [name] YNW:[id number] DOB: 04-28-1960 DOA: 04/23/2021     5 ?DOS: the patient was seen and examined on 04/28/2021 ?  ?Brief hospital course: ?Dimitrius Steedman is a 61 y.o. male with medical history significant of hypertension, hyperlipidemia, diabetes mellitus, CVA 2020, osteomyelitis s/p  right 2nd s/p great toe amputation who presented to the ED via EMS with complaints of worsening generalized weakness for 2-3 days. EMS reported found patient laying in urine soaked clothing.  Patient reported no acute complaints other than profound fatigue.  In the ED BP was 164/91 with other vitals normal.  ECG without acute ischemic changes.  Labs with mild hypokalemia and hs-troponin mildly elevated.   ? ?CT head showed new subacute to chronic right cerebellar infarct and chronic small vessel ischemic disease and generalized atrophy.   ? ?Subsequent CTA chest revealed bilateral pulmonary emboli with no evidence of right heart strain.  Started on heparin infusion and has since been transitioned to Eliquis (started 4/3 PM).  Lower extremity doppler U/S negative for DVT's. ? ?Assessment and Plan: ?* Acute pulmonary embolism ([place]) ?Patient presented with profound fatigue and generalized weakness, failure to thrive x3 days.  There was concern for acute stroke but MRI was negative and patient did not have any new focal neurologic deficits.  ?CTA chest showed pulmonary emboli in the distal right pulmonary artery, proximal right upper middle and lower lobe pulmonary artery branches.  No evidence of right heart strain. ?Lower extremity Doppler ultrasound negative for DVTs bilaterally. ?Started on heparin drip.   ?Transitioned to Eliquis. ?-- Continue Eliquis 10 mg PO BID x 7 days, then 5 mg PO BID ? ?Foot osteomyelitis, right ([place]) ?History of prior right great toe amputation due to osteomyelitis in the setting of PAD.  Patient  Has open wound at the site, foul odor and localized swelling.  MRI shows  osteomyelitis of the first metatarsal head. ?--Consults: Podiatry, Vascular surgery, Infectious disease ?--Plan for angiogram and possible revascularization tomorrow ?--Likely 1st ray amputation ?--No fever or leukocytosis, hold off antibiotics for now.  ID following   ?--Follow wound culture ?--Weightbearing as tolerated on the right foot in surgical shoe ? ?Generalized weakness ?PT and OT recommend SNF for rehab.  Patient's sister requested consideration for CIR, referral was placed by TOC.  If not accepted by CIR, prefer home with home health, declined SNF placement. ?--Continue PT and OT. ?-- Out of bed to chair ?-- Mobilize frequently as tolerated ?-- Fall precautions ? ?Open wound of right foot ?History of R great toe amputation due to osteomyelitis.  Hx of PAD.   ?Right foot xray without signs of osteo (4/4). ?Wound is open and malodorous, no purulent drainage seen on my exam, serosanguinous drainage on gauze dressing. ?MRI confirmed osteomyelitis. ?--WOC consulted ?-- Continue wound care per instructions ?-- See osteomyelitis ? ?Elevated troponin ?No active chest pain or acute ischemic changes on EKG.  Cardiology consulted. ?Due to demand ischemia in the setting of acute pulmonary emboli. ?-- Per cardiology, okay off aspirin while on Eliquis ?-- Follow-up in cardiology clinic in 3 to 4 weeks ?-- Outpatient ischemic evaluation recommended ?-- Continue statin ? ?CVA (cerebral vascular accident) ([place]) ?As outlined under stroke. ?MRI brain negative for acute stroke. ? ?Seizure disorder ([place]) ?Continue Keppra ? ?HTN (hypertension) ?BP remains elevated on amlodipine 2.5 mg. ?-- Increase amlodipine to 5 mg daily ?-- As needed oral hydralazine for SBP >160 ? ?Stroke [facility]) ?Hx of CVA 2020.   ?Head CT on  admission with subacute R cerebellar infarct, but MRI negative for acute CVA.   ?--Neurology consulted ?--Continue Eliquis, Lipitor ?--Stop ASA while on Eliquis, resume when off anticoagulation ?--PT/OT/SLP recommend  SNF, family requesting CIR - referral placed for evaluation ? ?Diabetes mellitus type II, non insulin dependent ([place]) ?Hold metformin. ?CBGs are controlled without insulin coverage. ?Monitor CBGs and add sliding scale NovoLog if consistently above 180. ? ? ? ? ?  ? ?Subjective: Patient awake resting in bed when seen today.  Denies any acute complaints including fevers chills, pain in his right foot, nausea vomiting, chest pain shortness of breath.  No acute events reported. ? ?Physical Exam: ?Vitals:  ? 04/28/21 0527 04/28/21 0837 04/28/21 1208 04/28/21 1601  ?BP: (!) 144/73 (!) 146/78 (!) 147/78 129/77  ?Pulse: 64 66 66 67  ?Resp: 17 18 14 18   ?Temp: 98.7 ?F (37.1 ?C) 98 ?F (36.7 ?C) 97.9 ?F (36.6 ?C) 98.3 ?F (36.8 ?C)  ?TempSrc:   Oral Oral  ?SpO2: 100% 100% 100% 99%  ?Weight:      ?Height:      ? ?General exam: awake, alert, no acute distress ?HEENT: moist mucus membranes, hearing grossly normal  ?Respiratory system: normal respiratory effort, on room air. ?Cardiovascular system: RRR, no peripheral edema.   ?Central nervous system: no gross focal neurologic deficits, normal speech ?Extremities: Clean dry intact dressing on the right foot, no edema, normal tone ?Skin: dry, intact, normal temperature ?Psychiatry: normal mood, congruent affect ? ? ?Data Reviewed: ? ?No new labs today.  CBGs remain at goal ? ?Family Communication: Spoke with sisters and [facility] by phone this afternoon.  Updated on vascular and podiatry's plans.  They are in agreement. ? ?Disposition: ?Status is: Inpatient ?Remains inpatient appropriate because: severity of illness with plans for procedure and likely surgery as outlined.  ? ? Planned Discharge Destination: Skilled nursing facility vs CIR ? ? ? ?Time spent: 45 minutes including time spent at bedside and in coordination of care with consultants, staff ? ?Author: ?Ezekiel Slocumb, DO ?04/28/2021 4:29 PM ? ?For on call review [email or web address].  ?

## 2021-04-29 ENCOUNTER — Encounter
Admission: EM | Disposition: A | Discharge status: Rehabilitation Facility | Payer: Self-pay | Source: Home / Self Care | Attending: Internal Medicine

## 2021-04-29 ENCOUNTER — Encounter: Payer: Self-pay | Admitting: Internal Medicine

## 2021-04-29 ENCOUNTER — Inpatient Hospital Stay: Payer: Medicare Other

## 2021-04-29 ENCOUNTER — Encounter: Payer: Self-pay | Admitting: Anesthesiology

## 2021-04-29 DIAGNOSIS — I7092 Chronic total occlusion of artery of the extremities: Secondary | ICD-10-CM

## 2021-04-29 DIAGNOSIS — L97519 Non-pressure chronic ulcer of other part of right foot with unspecified severity: Secondary | ICD-10-CM

## 2021-04-29 DIAGNOSIS — M86671 Other chronic osteomyelitis, right ankle and foot: Secondary | ICD-10-CM | POA: Diagnosis not present

## 2021-04-29 DIAGNOSIS — S81801A Unspecified open wound, right lower leg, initial encounter: Secondary | ICD-10-CM | POA: Diagnosis not present

## 2021-04-29 DIAGNOSIS — I70235 Atherosclerosis of native arteries of right leg with ulceration of other part of foot: Secondary | ICD-10-CM

## 2021-04-29 DIAGNOSIS — I2699 Other pulmonary embolism without acute cor pulmonale: Secondary | ICD-10-CM | POA: Diagnosis not present

## 2021-04-29 HISTORY — PX: LOWER EXTREMITY ANGIOGRAPHY: CATH118251

## 2021-04-29 LAB — CBC
HCT: 42.3 % (ref 39.0–52.0)
Hemoglobin: 14.2 g/dL (ref 13.0–17.0)
MCH: 29.3 pg (ref 26.0–34.0)
MCHC: 33.6 g/dL (ref 30.0–36.0)
MCV: 87.2 fL (ref 80.0–100.0)
Platelets: 223 10*3/uL (ref 150–400)
RBC: 4.85 MIL/uL (ref 4.22–5.81)
RDW: 11.8 % (ref 11.5–15.5)
WBC: 5.2 10*3/uL (ref 4.0–10.5)
nRBC: 0 % (ref 0.0–0.2)

## 2021-04-29 LAB — CREATININE, SERUM
Creatinine, Ser: 0.78 mg/dL (ref 0.61–1.24)
GFR, Estimated: >60 mL/min (ref >60)

## 2021-04-29 LAB — BASIC METABOLIC PANEL
Anion gap: 9 (ref 5–15)
BUN: 13 mg/dL (ref 6–20)
CO2: 25 mmol/L (ref 22–32)
Calcium: 9 mg/dL (ref 8.9–10.3)
Chloride: 100 mmol/L (ref 98–111)
Creatinine, Ser: 0.71 mg/dL (ref 0.61–1.24)
GFR, Estimated: >60 mL/min (ref >60)
Glucose, Bld: 111 mg/dL — ABNORMAL HIGH (ref 70–99)
Potassium: 3.8 mmol/L (ref 3.5–5.1)
Sodium: 134 mmol/L — ABNORMAL LOW (ref 135–145)

## 2021-04-29 LAB — AEROBIC/ANAEROBIC CULTURE W GRAM STAIN (SURGICAL/DEEP WOUND)
Culture: NORMAL
Gram Stain: NONE SEEN

## 2021-04-29 LAB — BUN: BUN: 13 mg/dL (ref 6–20)

## 2021-04-29 LAB — TSH: TSH: 2.099 u[IU]/mL (ref 0.350–4.500)

## 2021-04-29 SURGERY — LOWER EXTREMITY ANGIOGRAPHY
Anesthesia: Moderate Sedation | Laterality: Right

## 2021-04-29 MED ORDER — HEPARIN SODIUM (PORCINE) 1000 UNIT/ML IJ SOLN
INTRAMUSCULAR | Status: AC
  Filled 2021-04-29: qty 10

## 2021-04-29 MED ORDER — SODIUM CHLORIDE 0.9 % IV SOLN: INTRAVENOUS | Status: DC

## 2021-04-29 MED ORDER — CEFAZOLIN SODIUM-DEXTROSE 2-4 GM/100ML-% IV SOLN
2.0000 g | Freq: Once | INTRAVENOUS | Status: AC
  Administered 2021-04-29: 2 g via INTRAVENOUS

## 2021-04-29 MED ORDER — ONDANSETRON HCL 4 MG/2ML IJ SOLN: 4.0000 mg | Freq: Four times a day (QID) | INTRAMUSCULAR | Status: DC | PRN

## 2021-04-29 MED ORDER — HYDROMORPHONE HCL 1 MG/ML IJ SOLN: 1.0000 mg | Freq: Once | INTRAMUSCULAR | Status: DC | PRN

## 2021-04-29 MED ORDER — METHYLPREDNISOLONE SODIUM SUCC 125 MG IJ SOLR
125.0000 mg | Freq: Once | INTRAMUSCULAR | Status: DC | PRN
Start: 2021-04-29 — End: 2021-04-29

## 2021-04-29 MED ORDER — FENTANYL CITRATE (PF) 100 MCG/2ML IJ SOLN
INTRAMUSCULAR | Status: DC | PRN
  Administered 2021-04-29 (×2): 25 ug via INTRAVENOUS

## 2021-04-29 MED ORDER — DIPHENHYDRAMINE HCL 50 MG/ML IJ SOLN: 50.0000 mg | Freq: Once | INTRAMUSCULAR | Status: DC | PRN

## 2021-04-29 MED ORDER — HEPARIN SODIUM (PORCINE) 1000 UNIT/ML IJ SOLN
INTRAMUSCULAR | Status: DC | PRN
Start: 2021-04-29 — End: 2021-04-29
  Administered 2021-04-29: 5000 [IU] via INTRAVENOUS

## 2021-04-29 MED ORDER — MIDAZOLAM HCL 2 MG/ML PO SYRP: 8.0000 mg | ORAL_SOLUTION | Freq: Once | ORAL | Status: DC | PRN

## 2021-04-29 MED ORDER — GUAIFENESIN 100 MG/5ML PO LIQD: 5.0000 mL | ORAL | Status: DC | PRN

## 2021-04-29 MED ORDER — HYDRALAZINE HCL 20 MG/ML IJ SOLN
INTRAMUSCULAR | Status: DC | PRN
  Administered 2021-04-29: 10 mg via INTRAVENOUS

## 2021-04-29 MED ORDER — FAMOTIDINE 20 MG PO TABS
40.0000 mg | ORAL_TABLET | Freq: Once | ORAL | Status: DC | PRN
Start: 2021-04-29 — End: 2021-04-29

## 2021-04-29 MED ORDER — MIDAZOLAM HCL 2 MG/2ML IJ SOLN
INTRAMUSCULAR | Status: AC
Start: 2021-04-29 — End: 2021-04-29
  Filled 2021-04-29: qty 4

## 2021-04-29 MED ORDER — FENTANYL CITRATE PF 50 MCG/ML IJ SOSY
PREFILLED_SYRINGE | INTRAMUSCULAR | Status: AC
  Filled 2021-04-29: qty 2

## 2021-04-29 MED ORDER — HYDRALAZINE HCL 20 MG/ML IJ SOLN
INTRAMUSCULAR | Status: AC
  Filled 2021-04-29: qty 1

## 2021-04-29 MED ORDER — DM-GUAIFENESIN ER 30-600 MG PO TB12
1.0000 | ORAL_TABLET | Freq: Two times a day (BID) | ORAL | Status: DC
  Administered 2021-04-29 – 2021-05-06 (×14): 1 via ORAL
  Filled 2021-04-29 (×14): qty 1

## 2021-04-29 MED ORDER — MIDAZOLAM HCL 2 MG/2ML IJ SOLN
INTRAMUSCULAR | Status: DC | PRN
Start: 2021-04-29 — End: 2021-04-29
  Administered 2021-04-29 (×2): 1 mg via INTRAVENOUS

## 2021-04-29 SURGICAL SUPPLY — 16 items
CATH ANGIO 5F PIGTAIL 65CM (CATHETERS) ×1 IMPLANT
CATH BEACON 5 .038 100 VERT TP (CATHETERS) ×1 IMPLANT
CATH NAVICROSS ANGLED 135CM (MICROCATHETER) ×1 IMPLANT
CATH SEEKER .035X135CM (CATHETERS) ×1 IMPLANT
COVER PROBE U/S 5X48 (MISCELLANEOUS) ×1 IMPLANT
DEVICE STARCLOSE SE CLOSURE (Vascular Products) ×1 IMPLANT
DEVICE TORQUE .025-.038 (MISCELLANEOUS) ×1 IMPLANT
GLIDEWIRE ADV .035X260CM (WIRE) ×1 IMPLANT
GLIDEWIRE ANGLED SS 035X260CM (WIRE) ×1 IMPLANT
KIT ENCORE 26 ADVANTAGE (KITS) ×1 IMPLANT
PACK ANGIOGRAPHY (CUSTOM PROCEDURE TRAY) ×2 IMPLANT
SHEATH BRITE TIP 5FRX11 (SHEATH) ×1 IMPLANT
SHEATH RAABE 6FRX70 (SHEATH) ×1 IMPLANT
SYR MEDRAD MARK 7 150ML (SYRINGE) ×1 IMPLANT
TUBING CONTRAST HIGH PRESS 72 (TUBING) ×1 IMPLANT
WIRE GUIDERIGHT .035X150 (WIRE) ×1 IMPLANT

## 2021-04-29 NOTE — Progress Notes (Signed)
?Progress Note ? ? ?Patient: [name] ZTI:[id number] DOB: [birth year] DOA: 04/23/2021     6 ?DOS: the patient was seen and examined on 04/29/2021 ?  ?Brief [name] course: ?[name] is a 61 y.o. male with medical history significant of hypertension, hyperlipidemia, diabetes mellitus, CVA 2020, osteomyelitis s/p  right 2nd s/p great toe amputation who presented to the ED via EMS with complaints of worsening generalized weakness for 2-3 days. EMS reported found patient laying in urine soaked clothing.  Patient reported no acute complaints other than profound fatigue.  In the ED BP was 164/91 with other vitals normal.  ECG without acute ischemic changes.  Labs with mild hypokalemia and hs-troponin mildly elevated.   ? ?CT head showed Gregory subacute to chronic right cerebellar infarct and chronic small vessel ischemic disease and generalized atrophy.   ? ?Subsequent CTA chest revealed bilateral pulmonary emboli with no evidence of right heart strain.  Started on heparin infusion and has since been transitioned to Eliquis (started 4/3 PM).  Lower extremity doppler U/S negative for DVT's. ? ?Assessment and Plan: ?* Acute pulmonary embolism ([name]) ?Patient presented with profound fatigue and generalized weakness, failure to thrive x3 days.  There was concern for acute stroke but MRI was negative and patient did not have any Gregory focal neurologic deficits.  ?CTA chest showed pulmonary emboli in the distal right pulmonary artery, proximal right upper middle and lower lobe pulmonary artery branches.  No evidence of right heart strain. ?Lower extremity Doppler ultrasound negative for DVTs bilaterally. ?Started on heparin drip.   ?Transitioned to Eliquis. ?-- Continue Eliquis 10 mg PO BID x 7 days, then 5 mg PO BID ? ?Foot osteomyelitis, right ([name]) ?History of prior right great toe amputation due to osteomyelitis in the setting of PAD.  Patient  Has open wound at the site, foul odor and localized swelling.  MRI shows  osteomyelitis of the first metatarsal head. ?--Consults: Podiatry, Vascular surgery, Infectious disease ?--Plan for angiogram and revascularization today ?--Likely 1st ray amputation once adequate revascularization for healing ?--No fever or leukocytosis, hold off antibiotics for now.  ID following   ?--Follow wound culture ?--Weightbearing as tolerated on the right foot in surgical shoe ? ?Generalized weakness ?PT and OT recommend SNF for rehab.  Patient's sister requested consideration for CIR, referral was placed by TOC.  If not accepted by CIR, prefer home with home health, declined SNF placement. ?--Continue PT and OT. ?-- Out of bed to chair ?-- Mobilize frequently as tolerated ?-- Fall precautions ? ?Open wound of right foot ?History of R great toe amputation due to osteomyelitis.  Hx of PAD.   ?Right foot xray without signs of osteo (4/4). ?Wound is open and malodorous, no purulent drainage seen on my exam, serosanguinous drainage on gauze dressing. ?MRI confirmed osteomyelitis. ?--WOC consulted ?-- Continue wound care per instructions ?-- See osteomyelitis ? ?Elevated troponin ?No active chest pain or acute ischemic changes on EKG.  Cardiology consulted. ?Due to demand ischemia in the setting of acute pulmonary emboli. ?-- Per cardiology, okay off aspirin while on Eliquis ?-- Follow-up in cardiology clinic in 3 to 4 weeks ?-- Outpatient ischemic evaluation recommended ?-- Continue statin ? ?CVA (cerebral vascular accident) ([name]) ?As outlined under stroke. ?MRI brain negative for acute stroke. ? ?Seizure disorder ([name]) ?Continue Keppra ? ?HTN (hypertension) ?BP remains elevated on amlodipine 2.5 mg. ?-- Increase amlodipine to 5 mg daily ?-- As needed oral hydralazine for SBP >160 ? ?Stroke [name]) ?Hx of CVA 2020.   ?  Head CT on admission with subacute R cerebellar infarct, but MRI negative for acute CVA.   ?--Neurology consulted ?--Continue Eliquis, Lipitor ?--Stop ASA while on Eliquis, resume when off  anticoagulation ?--PT/OT/SLP recommend SNF, family requesting CIR - referral placed for evaluation ? ?Diabetes mellitus type II, non insulin dependent ([name]) ?Hold metformin. ?CBGs are controlled without insulin coverage. ?Monitor CBGs and add sliding scale NovoLog if consistently above 180. ? ? ? ? ?  ? ?Subjective: Pt seen after angio today, sleeping comfortably. Wakes briefly, denies pain, fever/chills or other complaints.  Nurse reported overnight pt had wet-sounding cough, otherwise no acute events reported.  ? ?Physical Exam: ?Vitals:  ? 04/29/21 1130 04/29/21 1145 04/29/21 1200 04/29/21 1601  ?BP: 140/77 (!) 144/74 134/77 127/82  ?Pulse:   68 81  ?Resp: 16 18 18 16   ?Temp:    97.8 ?F (36.6 ?C)  ?TempSrc:    Oral  ?SpO2: 99% 99% 99% 100%  ?Weight:      ?Height:      ? ?General exam: sleeping, appears comfortable, no acute distress ?HEENT: moist mucus membranes, hearing grossly normal  ?Respiratory system: CTAB, no wheezes, rales or rhonchi, normal respiratory effort. ?Cardiovascular system: normal S1/S2, RRR, no JVD, murmurs, rubs, gallops, no pedal edema.   ?Central nervous system: A&O. no gross focal neurologic deficits, normal speech ?Extremities: clean/dry/intact dressing on R foot, no edema, normal tone ?Skin: dry, intact, normal temperature ?Psychiatry: normal mood, congruent affect, judgement and insight appear normal ? ? ?Data Reviewed: ? ?Labs reviewed and notable for Na 134, glucose 111 ? ?Family Communication: none at bedside, sisters updated yesterday ? ?Disposition: ?Status is: Inpatient ?Remains inpatient appropriate because: further procedure/s and surgery planned for osteomyelitis due to PAD ? ? Planned Discharge Destination:  CIR ? ? ? ?Time spent: 35 minutes including time spent at bedside, in coordination of care and review of labs, diagnostics, medications and follow up with consultant recommendations. ? ?Author: ?Ezekiel Slocumb, DO ?04/29/2021 6:53 PM ? ?For on call review [email or web address].  ?

## 2021-04-29 NOTE — Progress Notes (Signed)
Case delayed until vascular status is improved.  Discussed with Dr. Lucky Cowboy, patient's flow current is not optimized to heal surgery.  Will await revasc prior to procedure.  Let patient know. ?

## 2021-04-29 NOTE — Interval H&P Note (Signed)
History and Physical Interval Note: ? ?04/29/2021 ?10:16 AM ? ?[name]  has presented today for surgery, with the diagnosis of Right Leg wound.  The various methods of treatment have been discussed with the patient and family. After consideration of risks, benefits and other options for treatment, the patient has consented to  Procedure(s): ?Lower Extremity Angiography (Right) as a surgical intervention.  The patient's history has been reviewed, patient examined, no change in status, stable for surgery.  I have reviewed the patient's chart and labs.  Questions were answered to the patient's satisfaction.   ? ? ?[name] ? ? ?

## 2021-04-29 NOTE — Progress Notes (Signed)
Patient doing a lot of coughing during the night, sounds like a choking cough. ?

## 2021-04-29 NOTE — Progress Notes (Signed)
Physical Therapy Treatment ?Patient Details ?Name: [name] ?MRN: [id number] ?DOB: 01-13-1961 ?Today's Date: 04/29/2021 ? ? ?History of Present Illness Patient is a 61 year old male with a PMH (+) for diabetes, prior CVA with weakness presents to the emergency department for generalized weakness. Small PE found on chest CT on 04/24/21. Pt diagnosed wtih right foot wound/bone infection and is s/p RLE vascular procedure 04/29/21 and per podiatry note likely plan for partial first ray revision amputation on Saturday 04/30/21. ? ?  ?PT Comments  ? ? Pt fatigued from vascular procedure this date but agreed to supine and seated therex, bed rest orders expired > 1 hour.  Pt put forth good effort with vascular procedure site monitored throughout with no signs of bleeding.  Per chart review pt likely to undergo partial first ray revision amputation on Saturday 04/30/21. Pt will benefit from PT services in a SNF setting upon discharge to safely address deficits listed in patient problem list for decreased caregiver assistance and eventual return to PLOF. ?    ?Recommendations for follow up therapy are one component of a multi-disciplinary discharge planning process, led by the attending physician.  Recommendations may be updated based on patient status, additional functional criteria and insurance authorization. ? ?Follow Up Recommendations ? Skilled nursing-short term rehab (<3 hours/day) ?  ?  ?Assistance Recommended at Discharge Frequent or constant Supervision/Assistance  ?Patient can return home with the following Two people to help with walking and/or transfers;A lot of help with bathing/dressing/bathroom;Help with stairs or ramp for entrance;Assist for transportation ?  ?Equipment Recommendations ? Other (comment) (TBD at next venue of care)  ?  ?Recommendations for Other Services   ? ? ?  ?Precautions / Restrictions Precautions ?Precautions: Fall ?Restrictions ?Weight Bearing Restrictions: Yes ?RLE Weight Bearing: Weight  bearing as tolerated  ?  ? ?Mobility ? Bed Mobility ?Overal bed mobility: Needs Assistance ?Bed Mobility: Rolling, Sidelying to Sit, Sit to Sidelying ?Rolling: Min assist ?Sidelying to sit: Mod assist ?  ?  ?Sit to sidelying: Mod assist ?General bed mobility comments: Log roll training provided with mod A for BLE and trunk control ?  ? ?Transfers ?  ?  ?  ?  ?  ?  ?  ?  ?  ?General transfer comment: deferred this date secondary to pt fatigue ?  ? ?Ambulation/Gait ?  ?  ?  ?  ?  ?  ?  ?  ? ? ?Stairs ?  ?  ?  ?  ?  ? ? ?Wheelchair Mobility ?  ? ?Modified Rankin (Stroke Patients Only) ?  ? ? ?  ?Balance Overall balance assessment: Needs assistance ?Sitting-balance support: Single extremity supported, Feet unsupported ?Sitting balance-Leahy Scale: Fair ?  ?  ?  ?  ?  ?  ?  ?  ?  ?  ?  ?  ?  ?  ?  ?  ?  ? ?  ?Cognition Arousal/Alertness: Awake/alert ?Behavior During Therapy: [facility] for tasks assessed/performed ?Overall Cognitive Status: Within Functional Limits for tasks assessed ?  ?  ?  ?  ?  ?  ?  ?  ?  ?  ?  ?  ?  ?  ?  ?  ?  ?  ?  ? ?  ?Exercises Total Joint Exercises ?Heel Slides: AROM, AAROM, Strengthening, Both, 10 reps ?Hip ABduction/ADduction: AAROM, Strengthening, Both, 10 reps ?Straight Leg Raises: AAROM, Strengthening, Both, 10 reps ?Long Arc Quad: AROM, Strengthening, Both, 10 reps ?Knee Flexion: AROM,  Strengthening, Both, 10 reps ? ?  ?General Comments   ?  ?  ? ?Pertinent Vitals/Pain Pain Assessment ?Pain Assessment: No/denies pain  ? ? ?Home Living   ?  ?  ?  ?  ?  ?  ?  ?  ?  ?   ?  ?Prior Function    ?  ?  ?   ? ?PT Goals (current goals can now be found in the care plan section) Progress towards PT goals: PT to reassess next treatment ? ?  ?Frequency ? ? ? Min 2X/week ? ? ? ?  ?PT Plan Current plan remains appropriate  ? ? ?Co-evaluation   ?  ?  ?  ?  ? ?  ?AM-PAC PT "6 Clicks" Mobility   ?Outcome Measure ? Help needed turning from your back to your side while in a flat bed without using bedrails?: A  Lot ?Help needed moving from lying on your back to sitting on the side of a flat bed without using bedrails?: A Lot ?Help needed moving to and from a bed to a chair (including a wheelchair)?: A Lot ?Help needed standing up from a chair using your arms (e.g., wheelchair or bedside chair)?: A Lot ?Help needed to walk in hospital room?: Total ?Help needed climbing 3-5 steps with a railing? : Total ?6 Click Score: 10 ? ?  ?End of Session   ?Activity Tolerance: Patient tolerated treatment well ?Patient left: in bed;with call bell/phone within reach;with bed alarm set;with family/visitor present ?Nurse Communication: Mobility status ?PT Visit Diagnosis: Unsteadiness on feet (R26.81);Muscle weakness (generalized) (M62.81);Difficulty in walking, not elsewhere classified (R26.2) ?  ? ? ?Time: 1517-6160 ?PT Time Calculation (min) (ACUTE ONLY): 19 min ? ?Charges:  $Therapeutic Activity: 8-22 mins          ?          ?D. Royetta Asal PT, DPT ?04/29/21, 5:22 PM ? ? ? ? ? ?

## 2021-04-29 NOTE — Care Management Important Message (Signed)
Important Message ? ?Patient Details  ?Name: [name] ?MRN: [id number] ?Date of Birth: [birth year] ? ? ?Medicare Important Message Given:  Yes ? ? ? ? ?Juliann Pulse A Jeydi Klingel ?04/29/2021, 12:32 PM ?

## 2021-04-29 NOTE — Op Note (Signed)
[facility] ? Percutaneous Study/Intervention Procedural Note ? ? ?Date of Surgery: 04/29/2021 ? ?Surgeon(s):Aolanis Crispen   ? ?Assistants:none ? ?Pre-operative Diagnosis: PAD with ulceration and infection right foot ? ?Post-operative diagnosis:  Same ? ?Procedure(s) Performed: ?            1.  Ultrasound guidance for vascular access left femoral artery ?            2.  Catheter placement into right popliteal artery from left femoral ?            3.  Aortogram and selective right lower extremity angiogram ?            4.  StarClose closure device left femoral artery ? ?EBL: 10 cc ? ?Contrast: 40 cc ? ?Fluoro Time: 15.1 minutes ? ?Moderate Conscious Sedation Time: approximately 43 minutes using 2 mg of Versed and 50 mcg of Fentanyl ?             ?Indications:  Patient is a 61 y.o.male with ulceration and infection of the right foot. The patient is brought in for angiography for further evaluation and potential treatment.  Due to the limb threatening nature of the situation, angiogram was performed for attempted limb salvage. The patient is aware that if the procedure fails, amputation would be expected.  The patient also understands that even with successful revascularization, amputation may still be required due to the severity of the situation.  Risks and benefits are discussed and informed consent is obtained.  ? ?Procedure:  The patient was identified and appropriate procedural time out was performed.  The patient was then placed supine on the table and prepped and draped in the usual sterile fashion. Moderate conscious sedation was administered during a face to face encounter with the patient throughout the procedure with my supervision of the RN administering medicines and monitoring the patient's vital signs, pulse oximetry, telemetry and mental status throughout from the start of the procedure until the patient was taken to the recovery room. Ultrasound was used to evaluate the left common  femoral artery.  It was patent .  A digital ultrasound image was acquired.  A Seldinger needle was used to access the left common femoral artery under direct ultrasound guidance and a permanent image was performed.  A 0.035 J wire was advanced without resistance and a 5Fr sheath was placed.  Pigtail catheter was placed into the aorta and an AP aortogram was performed. This demonstrated normal renal arteries and normal aorta and iliac segments without significant stenosis. I then crossed the aortic bifurcation and advanced to the right femoral head and into the proximal right SFA. Selective right lower extremity angiogram was then performed. This demonstrated normal common femoral artery, profunda femoris artery, mild disease throughout the SFA did not appear flow-limiting.  There was an occlusion of the distal popliteal artery, occlusion of the tibioperoneal trunk, and occlusion of all 3 tibial vessels proximally with the posterior tibial artery being the only runoff to the foot distally after reconstitution in the proximal segment. It was felt that it was in the patient's best interest to proceed with intervention after these images to avoid a second procedure and a larger amount of contrast and fluoroscopy based off of the findings from the initial angiogram. The patient was systemically heparinized and a 6 French 70 cm sheath was then placed over the Terumo Advantage wire. I then used a Kumpe catheter and the advantage wire to navigate down to the popliteal  artery and get into the area of occlusion.  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  I attempted to cross this occlusion with a variety of wires and catheters including a Kumpe catheter, a Nava cross catheter, a seeker catheter, and multiple wires including advantage and glide wires.  Despite multiple attempts, and over 15 minutes of fluoroscopy it was clear that we were not going to cross this from our typical up and over approach.   The patient can be brought back for an attempt at recanalization from a posterior tibial approach another day after clearance of his contrast for several days. I elected to terminate the procedure. The sheath was removed and StarClose closure device was deployed in the left femoral artery with excellent hemostatic result. The patient was taken to the recovery room in stable condition having tolerated the procedure well. ? ?Findings:   ?            Aortogram: Normal renal arteries, normal aorta and iliac arteries without significant stenosis ?            Right lower Extremity: This demonstrated normal common femoral artery, profunda femoris artery, mild disease throughout the SFA did not appear flow-limiting.  There was an occlusion of the distal popliteal artery, occlusion of the tibioperoneal trunk, and occlusion of all 3 tibial vessels proximally with the posterior tibial artery being the only runoff to the foot distally after reconstitution in the proximal segment. ? ? ?Disposition: Patient was taken to the recovery room in stable condition having tolerated the procedure well. ? ?Complications: None ? ?[name] ?04/29/2021 ?11:12 AM ? ? ?This note was created with Dragon Medical transcription system. Any errors in dictation are purely unintentional.  ?

## 2021-04-29 NOTE — Progress Notes (Signed)
Occupational Therapy Treatment ?Patient Details ?Name: [name] ?MRN: [id number] ?DOB: 1960/05/17 ?Today's Date: 04/29/2021 ? ? ?History of present illness Patient is a 61 year old male with a PMH (+) for diabetes, prior CVA with weakness presents to the emergency department for generalized weakness. Small PE found on chest CT on 04/24/21. ?  ?OT comments ? Pt seen for OT tx. Pt required MOD-MAX A for bed mobility and for ADL transfers. Pt tolerated standing and taking side steps EOB with MIN A with VC for sequencing with RW and for anterior weight shift to correct for posterior lean and bracing against the EOB. Pt continues to benefit from skilled OT Services.   ? ?Recommendations for follow up therapy are one component of a multi-disciplinary discharge planning process, led by the attending physician.  Recommendations may be updated based on patient status, additional functional criteria and insurance authorization. ?   ?Follow Up Recommendations ? Skilled nursing-short term rehab (<3 hours/day)  ?  ?Assistance Recommended at Discharge Frequent or constant Supervision/Assistance  ?Patient can return home with the following ? A lot of help with walking and/or transfers;A lot of help with bathing/dressing/bathroom;Direct supervision/assist for medications management;Assistance with cooking/housework;Assist for transportation;Help with stairs or ramp for entrance;Direct supervision/assist for financial management ?  ?Equipment Recommendations ? BSC/3in1;Other (comment) (2ww)  ?  ?Recommendations for Other Services   ? ?  ?Precautions / Restrictions Precautions ?Precautions: Fall ?Restrictions ?Weight Bearing Restrictions: Yes ?RLE Weight Bearing: Weight bearing as tolerated  ? ? ?  ? ?Mobility Bed Mobility ?Overal bed mobility: Needs Assistance ?Bed Mobility: Supine to Sit, Sit to Supine ?  ?  ?Supine to sit: Max assist, Mod assist, HOB elevated ?Sit to supine: Max assist, Mod assist ?  ?  ?  ? ?Transfers ?Overall  transfer level: Needs assistance ?Equipment used: Rolling walker (2 wheels) ?Transfers: Sit to/from Stand ?Sit to Stand: Mod assist, From elevated surface ?  ?  ?  ?  ?  ?General transfer comment: MODA  from slightly elevated EOB, VC for hand placement and noted posterior lean with bracing against bed requiring cues for anterior weight shift ?  ?  ?Balance Overall balance assessment: Needs assistance ?Sitting-balance support: Single extremity supported, No upper extremity supported, Feet supported ?Sitting balance-Leahy Scale: Fair ?  ?Postural control: Posterior lean ?Standing balance support: Bilateral upper extremity supported, During functional activity, Reliant on assistive device for balance ?Standing balance-Leahy Scale: Poor ?Standing balance comment: CGA-MIN A to maintain standing balance and during side steps EOB ?  ?  ?  ?  ?  ?  ?  ?  ?  ?  ?  ?   ? ?ADL either performed or assessed with clinical judgement  ? ?ADL   ?  ?  ?  ?  ?  ?  ?  ?  ?  ?  ?  ?  ?  ?  ?  ?  ?  ?  ?  ?  ?  ? ?Extremity/Trunk Assessment   ?  ?  ?  ?  ?  ? ?Vision   ?  ?  ?Perception   ?  ?Praxis   ?  ? ?Cognition Arousal/Alertness: Awake/alert ?Behavior During Therapy: [facility] for tasks assessed/performed ?Overall Cognitive Status: No family/caregiver present to determine baseline cognitive functioning ?  ?  ?  ?  ?  ?  ?  ?  ?  ?  ?  ?  ?  ?  ?  ?  ?General Comments:  alert, follows commands with cues, decreased insight into deficits ?  ?  ?   ?Exercises   ? ?  ?Shoulder Instructions   ? ? ?  ?General Comments    ? ? ?Pertinent Vitals/ Pain       Pain Assessment ?Pain Assessment:  (pt endorses discomfort with end range hip flexion but otherwise denies pain) ? ?Home Living   ?  ?  ?  ?  ?  ?  ?  ?  ?  ?  ?  ?  ?  ?  ?  ?  ?  ?  ? ?  ?Prior Functioning/Environment    ?  ?  ?  ?   ? ?Frequency ? Min 2X/week  ? ? ? ? ?  ?Progress Toward Goals ? ?OT Goals(current goals can now be found in the care plan section) ? Progress towards OT goals:  Progressing toward goals ? ?Acute Rehab OT Goals ?Patient Stated Goal: go home with brother ?OT Goal Formulation: With patient ?Time For Goal Achievement: 05/10/21 ?Potential to Achieve Goals: Good  ?Plan Discharge plan remains appropriate;Frequency remains appropriate   ? ?Co-evaluation ? ? ?   ?  ?  ?  ?  ? ?  ?AM-PAC OT "6 Clicks" Daily Activity     ?Outcome Measure ? ? Help from another person eating meals?: None ?Help from another person taking care of personal grooming?: A Little ?Help from another person toileting, which includes using toliet, bedpan, or urinal?: A Lot ?Help from another person bathing (including washing, rinsing, drying)?: A Lot ?Help from another person to put on and taking off regular upper body clothing?: A Little ?Help from another person to put on and taking off regular lower body clothing?: A Lot ?6 Click Score: 16 ? ?  ?End of Session Equipment Utilized During Treatment: Rolling walker (2 wheels);Gait belt ? ?OT Visit Diagnosis: Other abnormalities of gait and mobility (R26.89);Muscle weakness (generalized) (M62.81) ?  ?Activity Tolerance Patient tolerated treatment well ?  ?Patient Left in bed;with call bell/phone within reach;with bed alarm set ?  ?Nurse Communication Mobility status ?  ? ?   ? ?Time: 4585-9292 ?OT Time Calculation (min): 13 min ? ?Charges: OT General Charges ?$OT Visit: 1 Visit ?OT Treatments ?$Self Care/Home Management : 8-22 mins ? ?Ardeth Perfect., MPH, MS, OTR/L ?ascom [phone] ?04/29/21, 12:56 PM ?

## 2021-04-29 NOTE — Progress Notes (Signed)
ID ?Pt stable ?No specific complaints ?Had angio today ? ?O/e awake and alert, slow to respond ?BP 127/82 (BP Location: Right Arm)   Pulse 81   Temp 97.8 ?F (36.6 ?C) (Oral)   Resp 16   Ht 5\' 7"  (1.702 m)   Wt 72.1 kg   SpO2 100%   BMI 24.89 kg/m?   ?Chest b/l air entry ?Hss1s2 ?Abd soft ?Rt foot ?Great toe amputation site has a small wound ? ? ? ?Labs ? ?  Latest Ref Rng & Units 04/29/2021  ?  5:14 AM 04/28/2021  ?  3:54 AM 04/27/2021  ?  4:20 AM  ?CBC  ?WBC 4.0 - 10.5 K/uL 5.2   4.8   5.2    ?Hemoglobin 13.0 - 17.0 g/dL 14.2   14.1   14.0    ?Hematocrit 39.0 - 52.0 % 42.3   42.3   42.0    ?Platelets 150 - 400 K/uL 223   206   202    ?  ? ?  Latest Ref Rng & Units 04/29/2021  ?  1:14 PM 04/29/2021  ?  5:14 AM 04/28/2021  ?  3:54 AM  ?CMP  ?Glucose 70 - 99 mg/dL  111   125    ?BUN 6 - 20 mg/dL 13   13   16     ?Creatinine 0.61 - 1.24 mg/dL 0.78   0.71   0.76    ?Sodium 135 - 145 mmol/L  134   134    ?Potassium 3.5 - 5.1 mmol/L  3.8   3.7    ?Chloride 98 - 111 mmol/L  100   101    ?CO2 22 - 32 mmol/L  25   26    ?Calcium 8.9 - 10.3 mg/dL  9.0   9.0    ?  ?Angio ? normal common femoral artery, profunda femoris artery, mild disease throughout the SFA did not appear flow-limiting.  There was an occlusion of the distal popliteal artery, occlusion of the tibioperoneal trunk, and occlusion of all 3 tibial vessels proximally with the posterior tibial artery being the only runoff to the foot distally after reconstitution in the proximal segment. ? ? ?Impression/recommendation ? ?61 year old male presenting with weakness, poor appetite, altered mental status ?Found to have PE ?Also was dehydrated. ?? ?PE on apixaban. ? ?Generalized weakness with hyponatremia.  Serum cortisol was checked it was 14.7 ? ?Diabetes mellitus ? ?Indolent ulcer on the right toe amputation site.  No obvious evidence of infection clinically. ?MRI shows osteomyelitis which is likely chronic osteomyelitis.  Culture has been sent.  I do not think he will need  IV antibiotic.  Depending on the culture may do Po antibiotic . ? ?PAD- angio done today shows occlusion of the tibioperoneal trunk, and occlusion of all 3 tibial vessels proximally with the posterior tibial artery  ?Awaiting angioplasty ? ?ID will follow him peripherally this weekend- call if needed ?

## 2021-04-29 NOTE — Progress Notes (Signed)
Inpatient Rehab Admissions Coordinator:  ? ? I do not have a CIR bed for this Pt. He is not yet medically ready, as he has an angiogram and potential first ray amputation revision pending. I will follow up post-op for potential CIR admit if Pt. Appears to be an appropriate candidate at that time.  ? ?Clemens Catholic, MS, CCC-SLP ?Rehab Admissions Coordinator  ?[phone] (celll) ?[phone] (office) ? ?

## 2021-04-30 ENCOUNTER — Encounter
Admission: EM | Disposition: A | Discharge status: Rehabilitation Facility | Payer: Self-pay | Source: Home / Self Care | Attending: Internal Medicine

## 2021-04-30 ENCOUNTER — Encounter: Payer: Self-pay | Admitting: Vascular Surgery

## 2021-04-30 DIAGNOSIS — M86671 Other chronic osteomyelitis, right ankle and foot: Secondary | ICD-10-CM | POA: Diagnosis not present

## 2021-04-30 DIAGNOSIS — I2699 Other pulmonary embolism without acute cor pulmonale: Secondary | ICD-10-CM | POA: Diagnosis not present

## 2021-04-30 DIAGNOSIS — I739 Peripheral vascular disease, unspecified: Secondary | ICD-10-CM | POA: Diagnosis present

## 2021-04-30 SURGERY — AMPUTATION, FOOT, RAY
Anesthesia: Choice | Laterality: Right

## 2021-04-30 MED ORDER — LACTATED RINGERS IV SOLN: INTRAVENOUS | Status: AC

## 2021-04-30 MED ORDER — TAMSULOSIN HCL 0.4 MG PO CAPS
0.4000 mg | ORAL_CAPSULE | Freq: Every day | ORAL | Status: DC
  Administered 2021-04-30 – 2021-05-05 (×5): 0.4 mg via ORAL
  Filled 2021-04-30 (×5): qty 1

## 2021-04-30 NOTE — Assessment & Plan Note (Signed)
With history of right great toe amputation secondary to osteomyelitis as complication of PAD.  Now with right foot osteomyelitis, undergoing vascular evaluation and attempted revascularization. ?--Continue Eliquis, Lipitor ?

## 2021-04-30 NOTE — H&P (View-Only) (Signed)
No untoward complication from access from attempted antegrade revascularization. ? ?As noted in Dr. Bunnie Domino note, and by Dr. Luana Shu as well, an attempt at pedal access with retrograde traversal is currently the plan.  Period of renal recovery from the contrast administration is in order.  Date to be determined next week to do retrograde antegrade attempt at revascularization. ? ?I discussed this with the patient at length today and he understands the rationale for proceeding as outlined above. ?

## 2021-04-30 NOTE — Plan of Care (Signed)
  Problem: Health Behavior/Discharge Planning: Goal: Ability to manage health-related needs will improve Outcome: Progressing   

## 2021-04-30 NOTE — Progress Notes (Addendum)
?Progress Note ? ? ?Patient: [name] JME:[id number] DOB: 1960-06-28 DOA: 04/23/2021     7 ?DOS: the patient was seen and examined on 04/30/2021 ?  ?Brief hospital course: ?[name] is a 60 y.o. male with medical history significant of hypertension, hyperlipidemia, diabetes mellitus, CVA 2020, osteomyelitis s/p  right 2nd s/p great toe amputation who presented to the ED via EMS with complaints of worsening generalized weakness for 2-3 days. EMS reported found patient laying in urine soaked clothing.  Patient reported no acute complaints other than profound fatigue.  In the ED BP was 164/91 with other vitals normal.  ECG without acute ischemic changes.  Labs with mild hypokalemia and hs-troponin mildly elevated.   ? ?CT head showed new subacute to chronic right cerebellar infarct and chronic small vessel ischemic disease and generalized atrophy.   ? ?Subsequent CTA chest revealed bilateral pulmonary emboli with no evidence of right heart strain.  Started on heparin infusion and has since been transitioned to Eliquis (started 4/3 PM).  Lower extremity doppler U/S negative for DVT's. ? ?Assessment and Plan: ?* Acute pulmonary embolism ([place]) ?Patient presented with profound fatigue and generalized weakness, failure to thrive x3 days.  There was concern for acute stroke but MRI was negative and patient did not have any new focal neurologic deficits.  ?CTA chest showed pulmonary emboli in the distal right pulmonary artery, proximal right upper middle and lower lobe pulmonary artery branches.  No evidence of right heart strain. ?Lower extremity Doppler ultrasound negative for DVTs bilaterally. ?Started on heparin drip.   ?Transitioned to Eliquis. ?-- Continue Eliquis 10 mg PO BID x 7 days, then 5 mg PO BID ? ?Foot osteomyelitis, right ([place]) ?History of prior right great toe amputation due to osteomyelitis in the setting of PAD.  Patient  Has open wound at the site, foul odor and localized swelling.  MRI shows  osteomyelitis of the first metatarsal head. ?4/7: underwent angiography with Dr. Lucky Cowboy. Further revascularization is needed given findings of "..occlusion of the distal popliteal artery, occlusion of the tibioperoneal trunk, and occlusion of all 3 tibial vessels proximally with the posterior tibial artery being the only runoff to the foot distally after reconstitution in the proximal segment." ? ?--Consults: Podiatry, Vascular surgery, Infectious disease ?--Plan for attempt at further revascularization  ?--Likely 1st ray amputation once adequate revascularization for healing ?--No fever or leukocytosis, hold off antibiotics for now.  ID following   ?--Follow wound culture ?--Weightbearing as tolerated on the right foot in surgical shoe ? ?Peripheral artery disease ([place]) ?With history of right great toe amputation secondary to osteomyelitis as complication of PAD.  Now with right foot osteomyelitis, undergoing vascular evaluation and attempted revascularization. ?--Continue Eliquis, Lipitor ? ?Generalized weakness ?PT and OT recommend SNF for rehab.  Patient's sister requested consideration for CIR, referral was placed by TOC.  If not accepted by CIR, prefer home with home health, declined SNF placement. ?--Continue PT and OT. ?-- Out of bed to chair ?-- Mobilize frequently as tolerated ?-- Fall precautions ? ?Open wound of right foot ?History of R great toe amputation due to osteomyelitis.  Hx of PAD.   ?Right foot xray without signs of osteo (4/4). ?Wound is open and malodorous, no purulent drainage seen on my exam, serosanguinous drainage on gauze dressing. ?MRI confirmed osteomyelitis. ?--WOC consulted ?-- Continue wound care per instructions ?-- See osteomyelitis ? ?Elevated troponin ?No active chest pain or acute ischemic changes on EKG.  Cardiology consulted. ?Due to demand ischemia in the  setting of acute pulmonary emboli. ?-- Per cardiology, okay off aspirin while on Eliquis ?-- Follow-up in cardiology clinic  in 3 to 4 weeks ?-- Outpatient ischemic evaluation recommended ?-- Continue statin ? ?CVA (cerebral vascular accident) ([place]) ?As outlined under stroke. ?MRI brain negative for acute stroke. ? ?Seizure disorder ([place]) ?Continue Keppra ? ?HTN (hypertension) ?BP remains elevated on amlodipine 2.5 mg. ?-- Increase amlodipine to 5 mg daily ?-- As needed oral hydralazine for SBP >160 ? ?Stroke [facility]) ?Hx of CVA 2020.   ?Head CT on admission with subacute R cerebellar infarct, but MRI negative for acute CVA.   ?--Neurology consulted ?--Continue Eliquis, Lipitor ?--Stop ASA while on Eliquis, resume when off anticoagulation ?--PT/OT/SLP recommend SNF, family requesting CIR - referral placed for evaluation ? ?Diabetes mellitus type II, non insulin dependent ([place]) ?Hold metformin. ?CBGs are controlled without insulin coverage. ?Monitor CBGs and add sliding scale NovoLog if consistently above 180. ? ? ? ? ?  ? ?Subjective: Pt awake resting in bed when seen this AM. Reports he feels well.  Understand plan for more work on blood flow in his leg, probably on Monday.  No acute events reported. ?This afternoon, RN reports no urine output with about 250cc on bladder scan.  Started on gentle fluids. ? ?Physical Exam: ?Vitals:  ? 04/29/21 1936 04/30/21 0430 04/30/21 0819 04/30/21 1136  ?BP: 139/80 (!) 154/84 (!) 157/89 (!) 160/89  ?Pulse: 81 69 73 76  ?Resp: 20 17 16 16   ?Temp: 98.6 ?F (37 ?C) 98.1 ?F (36.7 ?C) 97.9 ?F (36.6 ?C) 98 ?F (36.7 ?C)  ?TempSrc: Oral Oral Oral Oral  ?SpO2: 100% 100% 100% 99%  ?Weight:      ?Height:      ? ?General exam: awake, alert, no acute distress ?HEENT: moist mucus membranes, hearing grossly normal  ?Respiratory system: normal respiratory effort. ?Cardiovascular system: RRR, no JVD, no pedal edema.   ?Central nervous system: A&O x3. no gross focal neurologic deficits, normal speech ?Extremities: moves all, no edema, normal tone ?Skin: dry, intact, normal temperature ?Psychiatry: normal mood, congruent  affect ? ? ?Data Reviewed: ? ?Renal function stable post angiography yesterday.  Otherwise no new labs today ? ?Family Communication: none at bedside, will attempt to call to update sister/s ? ?Disposition: ?Status is: Inpatient ?Remains inpatient appropriate because: Ongoing evaluation and procedures planned, not appropriate for the outpatient setting ? ? Planned Discharge Destination:  CIR ? ? ? ?Time spent: 35 minutes including time spent at bedside, in coordination of care, and review of consultant recommendations and diagnostic procedures. ? ?Author: ?Ezekiel Slocumb, DO ?04/30/2021 2:07 PM ? ?For on call review [email or web address].  ?

## 2021-04-30 NOTE — Progress Notes (Signed)
No untoward complication from access from attempted antegrade revascularization. ? ?As noted in Dr. Bunnie Domino note, and by Dr. Luana Shu as well, an attempt at pedal access with retrograde traversal is currently the plan.  Period of renal recovery from the contrast administration is in order.  Date to be determined next week to do retrograde antegrade attempt at revascularization. ? ?I discussed this with the patient at length today and he understands the rationale for proceeding as outlined above. ?

## 2021-05-01 DIAGNOSIS — R338 Other retention of urine: Secondary | ICD-10-CM | POA: Diagnosis not present

## 2021-05-01 DIAGNOSIS — I2699 Other pulmonary embolism without acute cor pulmonale: Secondary | ICD-10-CM | POA: Diagnosis not present

## 2021-05-01 LAB — BASIC METABOLIC PANEL
Anion gap: 6 (ref 5–15)
BUN: 21 mg/dL — ABNORMAL HIGH (ref 6–20)
CO2: 27 mmol/L (ref 22–32)
Calcium: 8.7 mg/dL — ABNORMAL LOW (ref 8.9–10.3)
Chloride: 99 mmol/L (ref 98–111)
Creatinine, Ser: 0.88 mg/dL (ref 0.61–1.24)
GFR, Estimated: >60 mL/min (ref >60)
Glucose, Bld: 116 mg/dL — ABNORMAL HIGH (ref 70–99)
Potassium: 4.1 mmol/L (ref 3.5–5.1)
Sodium: 132 mmol/L — ABNORMAL LOW (ref 135–145)

## 2021-05-01 MED ORDER — CHLORHEXIDINE GLUCONATE CLOTH 2 % EX PADS
6.0000 | MEDICATED_PAD | Freq: Every day | CUTANEOUS | Status: DC
  Administered 2021-05-01 – 2021-05-06 (×6): 6 via TOPICAL

## 2021-05-01 NOTE — Plan of Care (Signed)
  Problem: Health Behavior/Discharge Planning: Goal: Ability to manage health-related needs will improve Outcome: Progressing   

## 2021-05-01 NOTE — Assessment & Plan Note (Addendum)
Notified by RN of pt difficulty voiding (4/8 afternoon).  Pt reports some difficulty with voiding prior to admission also. ?-- Started on Flomax. ?Patient required multiple in and out catheterizations. ?-- Foley placed afternoon of 4/9 ?-- Urology follow up outpatient ?

## 2021-05-01 NOTE — Progress Notes (Signed)
?Progress Note ? ? ?Patient: [name] GMW:[id number] DOB: January 17, 1961 DOA: 04/23/2021     8 ?DOS: the patient was seen and examined on 05/01/2021 ?  ?Brief hospital course: ?[name] is a 61 y.o. male with medical history significant of hypertension, hyperlipidemia, diabetes mellitus, CVA 2020, osteomyelitis s/p  right 2nd s/p great toe amputation who presented to the ED via EMS with complaints of worsening generalized weakness for 2-3 days. EMS reported found patient laying in urine soaked clothing.  Patient reported no acute complaints other than profound fatigue.  In the ED BP was 164/91 with other vitals normal.  ECG without acute ischemic changes.  Labs with mild hypokalemia and hs-troponin mildly elevated.   ? ?CT head showed new subacute to chronic right cerebellar infarct and chronic small vessel ischemic disease and generalized atrophy.   ? ?Subsequent CTA chest revealed bilateral pulmonary emboli with no evidence of right heart strain.  Started on heparin infusion and has since been transitioned to Eliquis (started 4/3 PM).  Lower extremity doppler U/S negative for DVT's. ? ?Assessment and Plan: ?* Acute pulmonary embolism ([place]) ?Patient presented with profound fatigue and generalized weakness, failure to thrive x3 days.  There was concern for acute stroke but MRI was negative and patient did not have any new focal neurologic deficits.  ?CTA chest showed pulmonary emboli in the distal right pulmonary artery, proximal right upper middle and lower lobe pulmonary artery branches.  No evidence of right heart strain. ?Lower extremity Doppler ultrasound negative for DVTs bilaterally. ?Started on heparin drip.   ?Transitioned to Eliquis. ?-- Continue Eliquis 10 mg PO BID x 7 days, then 5 mg PO BID ? ?Foot osteomyelitis, right ([place]) ?History of prior right great toe amputation due to osteomyelitis in the setting of PAD.  Patient  Has open wound at the site, foul odor and localized swelling.  MRI shows  osteomyelitis of the first metatarsal head. ?4/7: underwent angiography with Dr. Lucky Cowboy. Further revascularization is needed given findings of "..occlusion of the distal popliteal artery, occlusion of the tibioperoneal trunk, and occlusion of all 3 tibial vessels proximally with the posterior tibial artery being the only runoff to the foot distally after reconstitution in the proximal segment." ? ?--Consults: Podiatry, Vascular surgery, Infectious disease ?--Plan for attempt at further revascularization  ?--Likely 1st ray amputation once adequate revascularization for healing ?--No fever or leukocytosis, hold off antibiotics for now.  ID following   ?--Follow wound culture ?--Weightbearing as tolerated on the right foot in surgical shoe ? ?Acute urinary retention ?Notified by RN of pt difficulty voiding (4/8 afternoon).  Pt reports some difficulty with voiding prior to admission also. ?-- Started on Flomax. ?-- Bladder scans ?-- In/out cath is >400cc or discomfort ?-- Foley if requires multiple in/out's ?-- Urology follow up outpatient ? ?Peripheral artery disease ([place]) ?With history of right great toe amputation secondary to osteomyelitis as complication of PAD.  Now with right foot osteomyelitis, undergoing vascular evaluation and attempted revascularization. ?--Continue Eliquis, Lipitor ? ?Generalized weakness ?PT and OT recommend SNF for rehab.  Patient's sister requested consideration for CIR, referral was placed by TOC.  If not accepted by CIR, prefer home with home health, declined SNF placement. ?--Continue PT and OT. ?-- Out of bed to chair ?-- Mobilize frequently as tolerated ?-- Fall precautions ? ?Open wound of right foot ?History of R great toe amputation due to osteomyelitis.  Hx of PAD.   ?Right foot xray without signs of osteo (4/4). ?Wound is open and  malodorous, no purulent drainage seen on my exam, serosanguinous drainage on gauze dressing. ?MRI confirmed osteomyelitis. ?--WOC consulted ?-- Continue  wound care per instructions ?-- See osteomyelitis ? ?Elevated troponin ?No active chest pain or acute ischemic changes on EKG.  Cardiology consulted. ?Due to demand ischemia in the setting of acute pulmonary emboli. ?-- Per cardiology, okay off aspirin while on Eliquis ?-- Follow-up in cardiology clinic in 3 to 4 weeks ?-- Outpatient ischemic evaluation recommended ?-- Continue statin ? ?CVA (cerebral vascular accident) ([place]) ?As outlined under stroke. ?MRI brain negative for acute stroke. ? ?Seizure disorder ([place]) ?Continue Keppra ? ?HTN (hypertension) ?BP remains elevated on amlodipine 2.5 mg. ?-- Increase amlodipine to 5 mg daily ?-- As needed oral hydralazine for SBP >160 ? ?Stroke [facility]) ?Hx of CVA 2020.   ?Head CT on admission with subacute R cerebellar infarct, but MRI negative for acute CVA.   ?--Neurology consulted ?--Continue Eliquis, Lipitor ?--Stop ASA while on Eliquis, resume when off anticoagulation ?--PT/OT/SLP recommend SNF, family requesting CIR - referral placed for evaluation ? ?Diabetes mellitus type II, non insulin dependent ([place]) ?Hold metformin. ?CBGs are controlled without insulin coverage. ?Monitor CBGs and add sliding scale NovoLog if consistently above 180. ? ? ? ? ?  ? ?Subjective: Pt awake resting in bed when seen.  He reports ongoing difficulty voiding.  No fever/chills or dysuria.  Reports this was going on at home just prior to admission also.  No other acute complaints.   ? ?Physical Exam: ?Vitals:  ? 05/01/21 0110 05/01/21 0546 05/01/21 0840 05/01/21 1117  ?BP: (!) 142/78 136/78  133/71  ?Pulse: 74 75  80  ?Resp: 20 17 15 16   ?Temp: 98 ?F (36.7 ?C) 98.2 ?F (36.8 ?C)  98.7 ?F (37.1 ?C)  ?TempSrc: Oral     ?SpO2: 99% 99%  99%  ?Weight:      ?Height:      ? ?General exam: awake, alert, no acute distress ?HEENT: moist mucus membranes, hearing grossly normal  ?Respiratory system: normal respiratory effort. ?Cardiovascular system: RRR, no pedal edema.   ?Gastrointestinal system: soft,  non-tender ?Central nervous system: A&O x3. no gross focal neurologic deficits, normal speech ?Extremities: moves all, no edema, normal tone ?Skin: dry, intact, normal temperature ?Psychiatry: normal mood, congruent affect, judgement and insight appear normal ? ? ? ?Data Reviewed: ? ?Labs reviewed notable for sodium 132, glucose 116, BUN 21, calcium 8.7 ? ?Family Communication: None at bedside on rounds.  No significant medical updates to provide at this time ? ?Disposition: ?Status is: Inpatient ?Remains inpatient appropriate because: Further planned procedure and likely surgery with vascular surgery and podiatry ? ? ? Planned Discharge Destination:  CIR ? ? ? ?Time spent: 35 minutes including time spent at bedside in coordination of care with consultants and ancillary staff ? ?Author: ?Ezekiel Slocumb, DO ?05/01/2021 3:23 PM ? ?For on call review [email or web address].  ?

## 2021-05-02 ENCOUNTER — Encounter
Admission: EM | Disposition: A | Discharge status: Rehabilitation Facility | Payer: Self-pay | Source: Home / Self Care | Attending: Internal Medicine

## 2021-05-02 DIAGNOSIS — I5032 Chronic diastolic (congestive) heart failure: Secondary | ICD-10-CM | POA: Diagnosis present

## 2021-05-02 DIAGNOSIS — E871 Hypo-osmolality and hyponatremia: Secondary | ICD-10-CM

## 2021-05-02 DIAGNOSIS — R338 Other retention of urine: Secondary | ICD-10-CM | POA: Diagnosis not present

## 2021-05-02 DIAGNOSIS — I2699 Other pulmonary embolism without acute cor pulmonale: Secondary | ICD-10-CM | POA: Diagnosis not present

## 2021-05-02 HISTORY — PX: LOWER EXTREMITY ANGIOGRAPHY: CATH118251

## 2021-05-02 LAB — CREATININE, SERUM
Creatinine, Ser: 0.78 mg/dL (ref 0.61–1.24)
GFR, Estimated: >60 mL/min (ref >60)

## 2021-05-02 LAB — BASIC METABOLIC PANEL
Anion gap: 5 (ref 5–15)
BUN: 17 mg/dL (ref 6–20)
CO2: 27 mmol/L (ref 22–32)
Calcium: 8.7 mg/dL — ABNORMAL LOW (ref 8.9–10.3)
Chloride: 99 mmol/L (ref 98–111)
Creatinine, Ser: 0.79 mg/dL (ref 0.61–1.24)
GFR, Estimated: >60 mL/min (ref >60)
Glucose, Bld: 165 mg/dL — ABNORMAL HIGH (ref 70–99)
Potassium: 4.1 mmol/L (ref 3.5–5.1)
Sodium: 131 mmol/L — ABNORMAL LOW (ref 135–145)

## 2021-05-02 LAB — GLUCOSE, CAPILLARY: Glucose-Capillary: 111 mg/dL — ABNORMAL HIGH (ref 70–99)

## 2021-05-02 LAB — BUN: BUN: 16 mg/dL (ref 6–20)

## 2021-05-02 LAB — OSMOLALITY: Osmolality: 280 mOsm/kg (ref 275–295)

## 2021-05-02 SURGERY — LOWER EXTREMITY ANGIOGRAPHY
Anesthesia: Moderate Sedation | Laterality: Right

## 2021-05-02 MED ORDER — SODIUM CHLORIDE 0.9 % IV SOLN: INTRAVENOUS | Status: DC

## 2021-05-02 MED ORDER — IODIXANOL 320 MG/ML IV SOLN
INTRAVENOUS | Status: DC | PRN
  Administered 2021-05-02: 30 mL via INTRA_ARTERIAL

## 2021-05-02 MED ORDER — MIDAZOLAM HCL 5 MG/5ML IJ SOLN
INTRAMUSCULAR | Status: AC
  Filled 2021-05-02: qty 5

## 2021-05-02 MED ORDER — METHYLPREDNISOLONE SODIUM SUCC 125 MG IJ SOLR
125.0000 mg | Freq: Once | INTRAMUSCULAR | Status: DC | PRN
Start: 2021-05-02 — End: 2021-05-02

## 2021-05-02 MED ORDER — MIDAZOLAM HCL 2 MG/2ML IJ SOLN
INTRAMUSCULAR | Status: DC | PRN
  Administered 2021-05-02: 1 mg via INTRAVENOUS

## 2021-05-02 MED ORDER — FENTANYL CITRATE (PF) 100 MCG/2ML IJ SOLN
INTRAMUSCULAR | Status: DC | PRN
  Administered 2021-05-02: 50 ug via INTRAVENOUS

## 2021-05-02 MED ORDER — MIDAZOLAM HCL 2 MG/ML PO SYRP
8.0000 mg | ORAL_SOLUTION | Freq: Once | ORAL | Status: DC | PRN
Start: 2021-05-02 — End: 2021-05-02
  Filled 2021-05-02: qty 4

## 2021-05-02 MED ORDER — FAMOTIDINE 20 MG PO TABS: 40.0000 mg | ORAL_TABLET | Freq: Once | ORAL | Status: DC | PRN

## 2021-05-02 MED ORDER — ONDANSETRON HCL 4 MG/2ML IJ SOLN
4.0000 mg | Freq: Four times a day (QID) | INTRAMUSCULAR | Status: DC | PRN
Start: 2021-05-02 — End: 2021-05-06

## 2021-05-02 MED ORDER — HYDROMORPHONE HCL 1 MG/ML IJ SOLN: 1.0000 mg | Freq: Once | INTRAMUSCULAR | Status: DC | PRN

## 2021-05-02 MED ORDER — FENTANYL CITRATE PF 50 MCG/ML IJ SOSY
PREFILLED_SYRINGE | INTRAMUSCULAR | Status: AC
  Filled 2021-05-02: qty 1

## 2021-05-02 MED ORDER — HEPARIN SODIUM (PORCINE) 1000 UNIT/ML IJ SOLN
INTRAMUSCULAR | Status: DC | PRN
  Administered 2021-05-02: 3000 [IU] via INTRAVENOUS

## 2021-05-02 MED ORDER — CEFAZOLIN SODIUM-DEXTROSE 2-4 GM/100ML-% IV SOLN
2.0000 g | Freq: Once | INTRAVENOUS | Status: AC
  Administered 2021-05-02: 2 g via INTRAVENOUS
  Filled 2021-05-02: qty 100

## 2021-05-02 MED ORDER — POVIDONE-IODINE 10 % EX SWAB: 2.0000 "application " | Freq: Once | CUTANEOUS | Status: DC

## 2021-05-02 MED ORDER — CHLORHEXIDINE GLUCONATE 4 % EX LIQD: 60.0000 mL | Freq: Once | CUTANEOUS | Status: DC

## 2021-05-02 MED ORDER — HEPARIN SODIUM (PORCINE) 1000 UNIT/ML IJ SOLN
INTRAMUSCULAR | Status: AC
  Filled 2021-05-02: qty 10

## 2021-05-02 MED ORDER — DIPHENHYDRAMINE HCL 50 MG/ML IJ SOLN: 50.0000 mg | Freq: Once | INTRAMUSCULAR | Status: DC | PRN

## 2021-05-02 SURGICAL SUPPLY — 23 items
BALLN LUTONIX 018 4X80X130 (BALLOONS) ×2
BALLN LUTONIX 018 5X100X130 (BALLOONS) ×2
BALLN LUTONIX 018 5X150X130 (BALLOONS) ×2
BALLN ULTRVRSE 3X220X150 (BALLOONS) ×2
BALLOON LUTONIX 018 4X80X130 (BALLOONS) IMPLANT
BALLOON LUTONIX 018 5X100X130 (BALLOONS) IMPLANT
BALLOON LUTONIX 018 5X150X130 (BALLOONS) IMPLANT
BALLOON ULTRVRSE 3X220X150 (BALLOONS) IMPLANT
CATH BEACON 5 .035 40 KMP TP (CATHETERS) IMPLANT
CATH BEACON 5 .038 40 KMP TP (CATHETERS) ×1
COVER PROBE U/S 5X48 (MISCELLANEOUS) ×1 IMPLANT
DEVICE RAD COMP TR BAND LRG (VASCULAR PRODUCTS) ×1 IMPLANT
DRAPE TABLE BACK 80X90 (DRAPES) ×1 IMPLANT
GLIDEWIRE ADV .035X180CM (WIRE) ×1 IMPLANT
KIT ENCORE 26 ADVANTAGE (KITS) ×1 IMPLANT
NDL ENTRY 21GA 7CM ECHOTIP (NEEDLE) IMPLANT
NEEDLE ENTRY 21GA 7CM ECHOTIP (NEEDLE) ×2 IMPLANT
PACK ANGIOGRAPHY (CUSTOM PROCEDURE TRAY) ×2 IMPLANT
SHEATH GLIDE SLENDER 4/5FR (SHEATH) ×1 IMPLANT
STENT LIFESTENT 5F 6X100X135 (Permanent Stent) ×1 IMPLANT
WIRE G 018X200 V18 (WIRE) ×1 IMPLANT
WIRE G V18X300CM (WIRE) ×1 IMPLANT
WIRE GUIDERIGHT .035X150 (WIRE) ×1 IMPLANT

## 2021-05-02 NOTE — Progress Notes (Addendum)
?Progress Note ? ? ?Patient: [name] HUD:[id number] DOB: 17-Feb-1960 DOA: 04/23/2021     9 ?DOS: the patient was seen and examined on 05/02/2021 ?  ?Brief hospital course: ?[name] is a 61 y.o. male with medical history significant of hypertension, hyperlipidemia, diabetes mellitus, CVA 2020, osteomyelitis s/p  right 2nd s/p great toe amputation who presented to the ED via EMS with complaints of worsening generalized weakness for 2-3 days. EMS reported found patient laying in urine soaked clothing.  Patient reported no acute complaints other than profound fatigue.  In the ED BP was 164/91 with other vitals normal.  ECG without acute ischemic changes.  Labs with mild hypokalemia and hs-troponin mildly elevated.   ? ?CT head showed new subacute to chronic right cerebellar infarct and chronic small vessel ischemic disease and generalized atrophy.   ? ?Subsequent CTA chest revealed bilateral pulmonary emboli with no evidence of right heart strain.  Started on heparin infusion and has since been transitioned to Eliquis (started 4/3 PM).  Lower extremity doppler U/S negative for DVT's. ? ?Assessment and Plan: ?* Acute pulmonary embolism ([place]) ?Patient presented with profound fatigue and generalized weakness, failure to thrive x3 days.  There was concern for acute stroke but MRI was negative and patient did not have any new focal neurologic deficits.  ?CTA chest showed pulmonary emboli in the distal right pulmonary artery, proximal right upper middle and lower lobe pulmonary artery branches.  No evidence of right heart strain. ?Lower extremity Doppler ultrasound negative for DVTs bilaterally. ?Started on heparin drip.   ?Transitioned to Eliquis. ?-- Continue Eliquis 10 mg PO BID x 7 days, then 5 mg PO BID ? ?Foot osteomyelitis, right ([place]) ?History of prior right great toe amputation due to osteomyelitis in the setting of PAD.  Patient  Has open wound at the site, foul odor and localized swelling.  MRI shows  osteomyelitis of the first metatarsal head. ?4/7: underwent angiography with Dr. Lucky Cowboy. Further revascularization is needed given findings of "..occlusion of the distal popliteal artery, occlusion of the tibioperoneal trunk, and occlusion of all 3 tibial vessels proximally with the posterior tibial artery being the only runoff to the foot distally after reconstitution in the proximal segment." ? ?--Consults: Podiatry, Vascular surgery, Infectious disease ?--Plan for attempt at further revascularization  ?--Likely 1st ray amputation once adequate revascularization for healing ?--No fever or leukocytosis, hold off antibiotics for now.  ID following   ?--Follow wound culture ?--Weightbearing as tolerated on the right foot in surgical shoe ? ?Hyponatremia ?Mild, sodium 131 this morning.  Suspect due to poor p.o. intake.  Normal serum Osmo 280. ?-- Continue NS at 75 cc/h ?-- Monitor BMP ?-- Further evaluation based on response ? ?Chronic diastolic CHF (congestive heart failure) ([place]) ?Appears euvolemic to slightly dry. ?Echo 04/26/2021 with EF 60 to 65%, mild LVH, grade 1 diastolic dysfunction, mild AR. ?-- Monitor volume status on fluids ? ?Acute urinary retention ?Notified by RN of pt difficulty voiding (4/8 afternoon).  Pt reports some difficulty with voiding prior to admission also. ?-- Started on Flomax. ?Patient required multiple in and out catheterizations. ?-- Foley placed afternoon of 4/9 ?-- Urology follow up outpatient ? ?Peripheral artery disease ([place]) ?With history of right great toe amputation secondary to osteomyelitis as complication of PAD.  Now with right foot osteomyelitis, undergoing vascular evaluation and attempted revascularization. ?--Continue Eliquis, Lipitor ? ?Generalized weakness ?PT and OT recommend SNF for rehab.  Patient's sister requested consideration for CIR, referral was placed by TOC.  If not accepted by CIR, prefer home with home health, declined SNF placement. ?--Continue PT and OT. ?--  Out of bed to chair ?-- Mobilize frequently as tolerated ?-- Fall precautions ? ?Open wound of right foot ?History of R great toe amputation due to osteomyelitis.  Hx of PAD.   ?Right foot xray without signs of osteo (4/4). ?Wound is open and malodorous, no purulent drainage seen on my exam, serosanguinous drainage on gauze dressing. ?MRI confirmed osteomyelitis. ?--WOC consulted ?-- Continue wound care per instructions ?-- See osteomyelitis ? ?Elevated troponin ?No active chest pain or acute ischemic changes on EKG.  Cardiology consulted. ?Due to demand ischemia in the setting of acute pulmonary emboli. ?-- Per cardiology, okay off aspirin while on Eliquis ?-- Follow-up in cardiology clinic in 3 to 4 weeks ?-- Outpatient ischemic evaluation recommended ?-- Continue statin ? ?CVA (cerebral vascular accident) ([place]) ?As outlined under stroke. ?MRI brain negative for acute stroke. ? ?Seizure disorder ([place]) ?Continue Keppra ? ?HTN (hypertension) ?BP remains elevated on amlodipine 2.5 mg. ?-- Increase amlodipine to 5 mg daily ?-- As needed oral hydralazine for SBP >160 ? ?Stroke [facility]) ?Hx of CVA 2020.   ?Head CT on admission with subacute R cerebellar infarct, but MRI negative for acute CVA.   ?--Neurology consulted ?--Continue Eliquis, Lipitor ?--Stop ASA while on Eliquis, resume when off anticoagulation ?--PT/OT/SLP recommend SNF, family requesting CIR - referral placed for evaluation ? ?Diabetes mellitus type II, non insulin dependent ([place]) ?Hold metformin. ?CBGs are controlled without insulin coverage. ?Monitor CBGs and add sliding scale NovoLog if consistently above 180. ? ? ? ? ?  ? ?Subjective: Patient awake resting in bed when seen today.  He reported worsening therapy earlier and said he felt good to be out of bed.  Fevers chills, cough congestion or shortness of breath he is not sure when vascular procedures planned yet. ? ?Physical Exam: ?Vitals:  ? 05/02/21 0541 05/02/21 0800 05/02/21 1257 05/02/21 1554  ?BP:  138/78 138/78 136/77 120/78  ?Pulse: 74 74 83 72  ?Resp:  20 17 17   ?Temp: 98.4 ?F (36.9 ?C) 98.8 ?F (37.1 ?C) 98.6 ?F (37 ?C) 98 ?F (36.7 ?C)  ?TempSrc: Oral Oral  Oral  ?SpO2: 100% 99% 98% 98%  ?Weight:      ?Height:      ? ?General exam: awake, alert, no acute distress ?HEENT: moist mucus membranes, hearing grossly normal  ?Respiratory system: normal respiratory effort.  On room air ?Cardiovascular system:  RRR, no pedal edema.   ?Central nervous system: A&O x. no gross focal neurologic deficits, normal speech ?Extremities: Clean dry intact dressing on right foot, no edema, normal tone ?Skin: dry, intact, normal temperature ?Psychiatry: normal mood, congruent affect ? ? ?Data Reviewed: ? ?Labs reviewed notable for sodium 131, glucose 165, calcium 8.7.  Serum osmolality 280 ? ?Family Communication: None at bedside, will attempt to call this afternoon as time allows. ? ?Disposition: ?Status is: Inpatient ?Remains inpatient appropriate because: Further procedures and surgery planned for peripheral vascular disease and osteomyelitis of the right foot. ? ? Planned Discharge Destination: Skilled nursing facility versus CIR ? ? ? ?Time spent: 35 minutes ? ?Author: ?Ezekiel Slocumb, DO ?05/02/2021 4:16 PM ? ?For on call review [email or web address].  ?

## 2021-05-02 NOTE — Progress Notes (Signed)
OT Cancellation Note ? ?Patient Details ?Name: Floyde Dingley ?MRN: [id number] ?DOB: [birth year] ? ? ?Cancelled Treatment:    Reason Eval/Treat Not Completed: Other (comment). Upon attempt, pt with nurse tech providing HCG bath prior to procedure later today. Will re-attempt OT tx at later time/date as pt is available.  ? ?Ardeth Perfect., MPH, MS, OTR/L ?ascom [phone] ?05/02/21, 2:57 PM ? ?

## 2021-05-02 NOTE — Progress Notes (Signed)
Physical Therapy Treatment ?Patient Details ?Name: [name] ?MRN: [id number] ?DOB: November 18, 1960 ?Today's Date: 05/02/2021 ? ? ?History of Present Illness Patient is a 61 year old male with a PMH (+) for diabetes, prior CVA with weakness presents to the emergency department for generalized weakness. Small PE found on chest CT on 04/24/21. Pt diagnosed wtih right foot wound/bone infection and is s/p RLE vascular procedure 04/29/21. Per chart review further revascularization surgery and partial first ray revision amputation likely pending. ? ?  ?PT Comments  ? ? Pt was pleasant and motivated to participate during the session and put forth good effort throughout. Pt made good progress towards goals this session including requiring decreased physical assistance with bed mobility and transfers.  Additionally pt was able to amb grossly 15 feet this session with very slow, step-to gait pattern but was steady without LOB.  Pt will benefit from PT services in a SNF setting upon discharge to safely address deficits listed in patient problem list for decreased caregiver assistance and eventual return to PLOF. ? ?   ?Recommendations for follow up therapy are one component of a multi-disciplinary discharge planning process, led by the attending physician.  Recommendations may be updated based on patient status, additional functional criteria and insurance authorization. ? ?Follow Up Recommendations ? Skilled nursing-short term rehab (<3 hours/day) ?  ?  ?Assistance Recommended at Discharge Frequent or constant Supervision/Assistance  ?Patient can return home with the following A lot of help with bathing/dressing/bathroom;Help with stairs or ramp for entrance;Assist for transportation;A lot of help with walking and/or transfers;Assistance with cooking/housework ?  ?Equipment Recommendations ? Other (comment) (TBD at next venue of care)  ?  ?Recommendations for Other Services   ? ? ?  ?Precautions / Restrictions  Precautions ?Precautions: Fall ?Restrictions ?Weight Bearing Restrictions: Yes ?RLE Weight Bearing: Weight bearing as tolerated ?Other Position/Activity Restrictions: RLE WBAT with post-op shoe donned  ?  ? ?Mobility ? Bed Mobility ?Overal bed mobility: Needs Assistance ?Bed Mobility: Supine to Sit, Sit to Supine ?  ?  ?  ?Sit to supine: Min assist ?Sit to sidelying: Min assist ?General bed mobility comments: Min A for BLE and trunk control ?  ? ?Transfers ?Overall transfer level: Needs assistance ?Equipment used: Rolling walker (2 wheels) ?Transfers: Sit to/from Stand ?Sit to Stand: Min assist, From elevated surface ?  ?  ?  ?  ?  ?General transfer comment: Mod verbal cues for sequencing ?  ? ?Ambulation/Gait ?Ambulation/Gait assistance: Min guard ?Gait Distance (Feet): 15 Feet ?Assistive device: Rolling walker (2 wheels) ?Gait Pattern/deviations: Step-to pattern ?Gait velocity: decreased ?  ?  ?General Gait Details: Pt steady with amb with post-op shoe donned to R foot with min verbal cues for general sequencing ? ? ?Stairs ?  ?  ?  ?  ?  ? ? ?Wheelchair Mobility ?  ? ?Modified Rankin (Stroke Patients Only) ?  ? ? ?  ?Balance Overall balance assessment: Needs assistance ?Sitting-balance support: Single extremity supported, Feet unsupported ?Sitting balance-Leahy Scale: Good ?  ?  ?Standing balance support: Bilateral upper extremity supported, During functional activity ?Standing balance-Leahy Scale: Fair ?  ?  ?  ?  ?  ?  ?  ?  ?  ?  ?  ?  ?  ? ?  ?Cognition Arousal/Alertness: Awake/alert ?Behavior During Therapy: [facility] for tasks assessed/performed ?Overall Cognitive Status: Within Functional Limits for tasks assessed ?  ?  ?  ?  ?  ?  ?  ?  ?  ?  ?  ?  ?  ?  ?  ?  ?  ?  ?  ? ?  ?  Exercises Total Joint Exercises ?Ankle Circles/Pumps: AROM, Strengthening, Both, 10 reps ?Quad Sets: Strengthening, Both, 10 reps ?Hip ABduction/ADduction: AAROM, AROM, Strengthening, Both, 10 reps ?Straight Leg Raises: AROM, AAROM,  Strengthening, Both, 10 reps ?Long Arc Quad: AROM, Strengthening, Both, 10 reps ?Knee Flexion: AROM, Strengthening, Both, 10 reps ? ?  ?General Comments   ?  ?  ? ?Pertinent Vitals/Pain Pain Assessment ?Pain Assessment: No/denies pain  ? ? ?Home Living   ?  ?  ?  ?  ?  ?  ?  ?  ?  ?   ?  ?Prior Function    ?  ?  ?   ? ?PT Goals (current goals can now be found in the care plan section) Progress towards PT goals: Progressing toward goals ? ?  ?Frequency ? ? ? Min 2X/week ? ? ? ?  ?PT Plan Current plan remains appropriate  ? ? ?Co-evaluation   ?  ?  ?  ?  ? ?  ?AM-PAC PT "6 Clicks" Mobility   ?Outcome Measure ? Help needed turning from your back to your side while in a flat bed without using bedrails?: A Little ?Help needed moving from lying on your back to sitting on the side of a flat bed without using bedrails?: A Little ?Help needed moving to and from a bed to a chair (including a wheelchair)?: A Little ?Help needed standing up from a chair using your arms (e.g., wheelchair or bedside chair)?: A Little ?Help needed to walk in hospital room?: A Little ?Help needed climbing 3-5 steps with a railing? : A Lot ?6 Click Score: 17 ? ?  ?End of Session Equipment Utilized During Treatment: Gait belt ?Activity Tolerance: Patient tolerated treatment well ?Patient left: in bed;with call bell/phone within reach;with bed alarm set ?Nurse Communication: Mobility status ?PT Visit Diagnosis: Unsteadiness on feet (R26.81);Muscle weakness (generalized) (M62.81);Difficulty in walking, not elsewhere classified (R26.2) ?  ? ? ?Time: 1030-1058 ?PT Time Calculation (min) (ACUTE ONLY): 28 min ? ?Charges:  $Gait Training: 8-22 mins ?$Therapeutic Exercise: 8-22 mins          ?          ? ?D. Royetta Asal PT, DPT ?05/02/21, 1:47 PM ? ? ?

## 2021-05-02 NOTE — Interval H&P Note (Signed)
History and Physical Interval Note: ? ?05/02/2021 ?3:32 PM ? ?[name]  has presented today for surgery, with the diagnosis of Right Leg pain/Pedial approach.  The various methods of treatment have been discussed with the patient and family. After consideration of risks, benefits and other options for treatment, the patient has consented to  Procedure(s): ?Lower Extremity Angiography (Right) as a surgical intervention.  The patient's history has been reviewed, patient examined, no change in status, stable for surgery.  I have reviewed the patient's chart and labs.  Questions were answered to the patient's satisfaction.   ? ? ?[name] ? ? ?

## 2021-05-02 NOTE — Assessment & Plan Note (Addendum)
Appears euvolemic to slightly dry. ?Echo 04/26/2021 with EF 60 to 65%, mild LVH, grade 1 diastolic dysfunction, mild AR. ?-- Monitor volume with strict I/O's and daily weights ?--May need diuresis if sodium continues to decline (?becoming hypervolemic) ?

## 2021-05-02 NOTE — Assessment & Plan Note (Addendum)
Sodium 129 this morning.  Suspect due to poor p.o. intake.  Normal serum Osm 280, UrOsm 465, UrNa 48. ?-- Stop IV fluids ?-- Consider diuresis, but does not appear clinically hypervolemic ?-- Daily weights and strict I/O's ?-- Monitor BMP ?

## 2021-05-02 NOTE — Op Note (Signed)
[facility] ? Percutaneous Study/Intervention Procedural Note ? ? ?Date of Surgery: 05/02/2021 ? ?Surgeon(s):Pollyanna Levay   ? ?Assistants:none ? ?Pre-operative Diagnosis: PAD with ulceration right foot ? ?Post-operative diagnosis:  Same ? ?Procedure(s) Performed: ?            1.  Ultrasound guidance for vascular access right posterior tibial artery ?            2.  Right lower extremity angiogram ?            3.  Percutaneous transluminal angioplasty of the right posterior tibial artery and tibioperoneal trunk with 3 mm diameter angioplasty balloon and 4 mm diameter Lutonix drug-coated angioplasty balloon ?            4.  Percutaneous transluminal angioplasty of right popliteal artery with 5 mm diameter Lutonix drug-coated angioplasty balloon ?            5.  Stent placement to the right popliteal artery with 6 mm diameter by 10 cm length ?  ? ?EBL: 10 cc ? ?Contrast: 30 cc ? ?Fluoro Time: 3.8 minutes ? ?Moderate Conscious Sedation Time: approximately 37 minutes using 1 mg of Versed and 50 mcg of Fentanyl ?             ?Indications:  Patient is a 61 y.o.male with PAD and ulceration of the right foot.  Up and over approach was unsuccessful in terms of revascularization last week but gave [place] a roadmap for future revascularization plans.  Now we are approaching this from a pedal approach.  The patient is brought in for angiography for further evaluation and potential treatment.  Due to the limb threatening nature of the situation, angiogram was performed for attempted limb salvage. The patient is aware that if the procedure fails, amputation would be expected.  The patient also understands that even with successful revascularization, amputation may still be required due to the severity of the situation.  Risks and benefits are discussed and informed consent is obtained.  ? ?Procedure:  The patient was identified and appropriate procedural time out was performed.  The patient was then placed supine  on the table and prepped and draped in the usual sterile fashion. Moderate conscious sedation was administered during a face to face encounter with the patient throughout the procedure with my supervision of the RN administering medicines and monitoring the patient's vital signs, pulse oximetry, telemetry and mental status throughout from the start of the procedure until the patient was taken to the recovery room. Ultrasound was used to evaluate the right posterior tibial artery.  It was patent .  A digital ultrasound image was acquired.  A micropuncture needle was used to access the right posterior tibial artery under direct ultrasound guidance and a permanent image was performed.  A 0.018 wire was advanced without resistance and a 5Fr slender sheath was placed.  Imaging showed the occlusion of the proximal posterior tibial artery tibioperoneal trunk and distal popliteal artery with reconstitution of the proximal and mid popliteal artery.  I then used a Kumpe catheter and the advantage wire to navigate through the occlusion with no difficulty and confirm intraluminal flow in the superficial femoral artery where imaging showed the superficial femoral artery to be patent.  I then exchanged for a 0.018 wire and proceeded with treatment.  3 mm diameter by 22 cm length angioplasty balloon was used to treat the tibioperoneal trunk and posterior tibial artery as well as to predilate the popliteal lesion.  This was inflated to 10 atm for 1 minute.  A 5 mm diameter by 15 cm length Lutonix drug-coated angioplasty balloon was inflated in the popliteal artery and taken up to 6 atm for 1 minute.  Completion imaging showed flow-limiting residual stenosis in both the tibioperoneal trunk and the popliteal artery.  Popliteal artery, a 6 mm diameter by 10 cm length life stent was deployed down to the origin of the anterior tibial artery which was chronically occluded.  This was postdilated with a 5 mm diameter Lutonix drug-coated  angioplasty balloon in the popliteal artery.  A 4 mm diameter by 8 cm length Lutonix drug-coated angioplasty balloon was then inflated in the tibioperoneal trunk and most proximal portion of the posterior tibial artery and up to the distal edge of the popliteal stent and taken up to 8 atm for 1 minute.  Completion imaging showed brisk flow with less than 20% residual stenosis in the popliteal artery, tibioperoneal trunk, or posterior tibial artery. I elected to terminate the procedure. The sheath was removed and TR band was placed on the right ankle at the access site.  The patient was taken to the recovery room in stable condition having tolerated the procedure well. ? ?Findings:   ?          ?            Right Lower Extremity: Occlusion of the distal popliteal artery, tibioperoneal trunk, and proximal portion of the posterior tibial artery able to be revascularized with angioplasty in the tibial vessels and angioplasty and stenting in the popliteal artery ? ? ?Disposition: Patient was taken to the recovery room in stable condition having tolerated the procedure well. ? ?Complications: None ? ?[name] ?05/02/2021 ?5:07 PM ? ? ?This note was created with Dragon Medical transcription system. Any errors in dictation are purely unintentional.  ?

## 2021-05-02 NOTE — Plan of Care (Signed)
  Problem: Health Behavior/Discharge Planning: Goal: Ability to manage health-related needs will improve Outcome: Progressing   

## 2021-05-02 NOTE — Progress Notes (Signed)
Inpatient Rehab Admissions Coordinator:  ? ?Pt. With pending surgical interventions, not yet medically ready for CIR. Pt. Also was not able to tolerate Oob during most recent session. I will follow for potential admit pending medical readiness and improved participation with therapies.  ? ?Clemens Catholic, MS, CCC-SLP ?Rehab Admissions Coordinator  ?[phone] (celll) ?[phone] (office) ? ?

## 2021-05-03 ENCOUNTER — Encounter: Payer: Self-pay | Admitting: Vascular Surgery

## 2021-05-03 DIAGNOSIS — R531 Weakness: Secondary | ICD-10-CM | POA: Diagnosis not present

## 2021-05-03 DIAGNOSIS — E871 Hypo-osmolality and hyponatremia: Secondary | ICD-10-CM

## 2021-05-03 DIAGNOSIS — M86671 Other chronic osteomyelitis, right ankle and foot: Secondary | ICD-10-CM | POA: Diagnosis not present

## 2021-05-03 LAB — BASIC METABOLIC PANEL
Anion gap: 5 (ref 5–15)
BUN: 21 mg/dL — ABNORMAL HIGH (ref 6–20)
CO2: 27 mmol/L (ref 22–32)
Calcium: 8.7 mg/dL — ABNORMAL LOW (ref 8.9–10.3)
Chloride: 97 mmol/L — ABNORMAL LOW (ref 98–111)
Creatinine, Ser: 0.85 mg/dL (ref 0.61–1.24)
GFR, Estimated: >60 mL/min (ref >60)
Glucose, Bld: 193 mg/dL — ABNORMAL HIGH (ref 70–99)
Potassium: 4.4 mmol/L (ref 3.5–5.1)
Sodium: 129 mmol/L — ABNORMAL LOW (ref 135–145)

## 2021-05-03 LAB — SODIUM, URINE, RANDOM: Sodium, Ur: 48 mmol/L

## 2021-05-03 LAB — OSMOLALITY, URINE: Osmolality, Ur: 465 mOsm/kg (ref 300–900)

## 2021-05-03 MED ORDER — CEFAZOLIN SODIUM-DEXTROSE 2-4 GM/100ML-% IV SOLN
2.0000 g | INTRAVENOUS | Status: AC
  Administered 2021-05-04: 2 g via INTRAVENOUS
  Filled 2021-05-03: qty 100

## 2021-05-03 MED ORDER — CHLORHEXIDINE GLUCONATE 4 % EX LIQD
60.0000 mL | Freq: Once | CUTANEOUS | Status: AC
  Administered 2021-05-04: 4 via TOPICAL

## 2021-05-03 MED ORDER — APIXABAN 5 MG PO TABS
5.0000 mg | ORAL_TABLET | Freq: Two times a day (BID) | ORAL | Status: DC
  Administered 2021-05-05 – 2021-05-06 (×3): 5 mg via ORAL
  Filled 2021-05-03 (×5): qty 1

## 2021-05-03 MED ORDER — SODIUM CHLORIDE 0.9 % IV SOLN
3.0000 g | Freq: Four times a day (QID) | INTRAVENOUS | Status: AC
  Administered 2021-05-03 – 2021-05-06 (×12): 3 g via INTRAVENOUS
  Filled 2021-05-03 (×2): qty 3
  Filled 2021-05-03 (×3): qty 8
  Filled 2021-05-03 (×5): qty 3
  Filled 2021-05-03 (×2): qty 8
  Filled 2021-05-03: qty 3

## 2021-05-03 NOTE — Progress Notes (Signed)
? ?  Date of Admission:  04/23/2021    ? ?ID: [name] is a 61 y.o. male  ?Principal Problem: ?  Acute pulmonary embolism ([name]) ?Active Problems: ?  Diabetes mellitus type II, non insulin dependent ([name]) ?  Stroke [name]) ?  HTN (hypertension) ?  Seizure disorder ([name]) ?  CVA (cerebral vascular accident) [name]) ?  Elevated troponin ?  Open wound of right foot ?  Generalized weakness ?  Foot osteomyelitis, right ([name]) ?  Peripheral artery disease ([name]) ?  Acute urinary retention ?  Chronic diastolic CHF (congestive heart failure) ([name]) ?  Hyponatremia ? ? ? ?Subjective: ?Feeling okay ?Walking a few steps with PT ?Underwent angio yesterday ? ?Medications:  ?  stroke: early stages of recovery book   Does not apply Once  ? amLODipine  10 mg Oral Daily  ? [START ON 05/04/2021] apixaban  5 mg Oral BID  ? atorvastatin  20 mg Oral q1800  ? Chlorhexidine Gluconate Cloth  6 each Topical Daily  ? dextromethorphan-guaiFENesin  1 tablet Oral BID  ? levETIRAcetam  500 mg Oral BID  ? tamsulosin  0.4 mg Oral QPC supper  ? ? ?Objective: ?Vital signs in last 24 hours: ?Temp:  [98 ?F (36.7 ?C)-99.8 ?F (37.7 ?C)] 98 ?F (36.7 ?C) (04/11 1251) ?Pulse Rate:  [67-80] 80 (04/11 1251) ?Resp:  [14-21] 15 (04/11 1251) ?BP: (120-155)/(77-84) 141/77 (04/11 1251) ?SpO2:  [97 %-100 %] 99 % (04/11 1251) ? ?PHYSICAL EXAM:  ?General: Alert, cooperative, no distress,  ?Lungs: Clear to auscultation bilaterally. No Wheezing or Rhonchi. No rales. ?Heart: Regular rate and rhythm, no murmur, rub or gallop. ?Abdomen: Soft, non-tender,not distended. Bowel sounds normal. No masses ?Extremities: left great toe amputated stump wound ?Skin: No rashes or lesions. Or bruising ?Lymph: Cervical, supraclavicular normal. ?Neurologic: Grossly non-focal ? ?Lab Results ?Recent Labs  ?  05/02/21 ?3267 05/02/21 ?1245 05/03/21 ?0436  ?NA 131*  --  129*  ?K 4.1  --  4.4  ?CL 99  --  97*  ?CO2 27  --  27  ?BUN 17 16 21*  ?CREATININE 0.79 0.78 0.85  ? ?Liver Panel ?No results  for input(s): PROT, ALBUMIN, AST, ALT, ALKPHOS, BILITOT, BILIDIR, IBILI in the last 72 hours. ?Sedimentation Rate ?No results for input(s): ESRSEDRATE in the last 72 hours. ?C-Reactive Protein ?No results for input(s): CRP in the last 72 hours. ? ?Microbiology: ?Wound anerobes  ?Studies/Results: ?PERIPHERAL VASCULAR CATHETERIZATION ? ?Result Date: 05/02/2021 ?See surgical note for result.  ? ? ?Assessment/Plan: ? ?61 year old male presenting with weakness, poor appetite, altered mental status ?Found to have PE ?Also was dehydrated. ?? ?PE on apixaban. ? ?Generalized weakness with hyponatremia.  Serum cortisol was checked it was 14.7 ? ?Diabetes mellitus ? ?Indolent ulcer on the right toe amputation site.  No obvious evidence of infection clinically. ?MRI shows osteomyelitis - chronic osteomyelitis.  Culture anerobes- start unasyn as he is going for surgery tomorrow ? ?  ?PAD- angio  showed occlusion of the tibioperoneal trunk, and occlusion of all 3 tibial vessels proximally with the posterior tibial artery  ?Had balloon angioplasty of rt PTA and RT tibioperoneal trunk. Stent placement rt popliteal artery ? ?Discussed the management with care team ?  ?

## 2021-05-03 NOTE — Progress Notes (Signed)
?Progress Note ? ? ?Patient: [name] DOB: 08-Feb-1960 DOA: 04/23/2021     10 ?DOS: the patient was seen and examined on 05/03/2021 ?  ?Brief hospital course: ?[name] is a 61 y.o. male with medical history significant of hypertension, hyperlipidemia, diabetes mellitus, CVA 2020, osteomyelitis s/p  right 2nd s/p great toe amputation who presented to the ED via EMS with complaints of worsening generalized weakness for 2-3 days. EMS reported found patient laying in urine soaked clothing.  Patient reported no acute complaints other than profound fatigue.  In the ED BP was 164/91 with other vitals normal.  ECG without acute ischemic changes.  Labs with mild hypokalemia and hs-troponin mildly elevated.   ? ?CT head showed new subacute to chronic right cerebellar infarct and chronic small vessel ischemic disease and generalized atrophy.   ? ?Subsequent CTA chest revealed bilateral pulmonary emboli with no evidence of right heart strain.  Started on heparin infusion and has since been transitioned to Eliquis (started 4/3 PM).  Lower extremity doppler U/S negative for DVT's. ? ?Assessment and Plan: ?* Acute pulmonary embolism ([place]) ?Patient presented with profound fatigue and generalized weakness, failure to thrive x3 days.  There was concern for acute stroke but MRI was negative and patient did not have any new focal neurologic deficits.  ?CTA chest showed pulmonary emboli in the distal right pulmonary artery, proximal right upper middle and lower lobe pulmonary artery branches.  No evidence of right heart strain. ?Lower extremity Doppler ultrasound negative for DVTs bilaterally. ?Started on heparin drip.   ?Transitioned to Eliquis. ?-- Continue Eliquis 10 mg PO BID x 7 days, then 5 mg PO BID ? ?Foot osteomyelitis, right ([place]) ?History of prior right great toe amputation due to osteomyelitis in the setting of PAD.  Patient  Has open wound at the site, foul odor and localized swelling.  MRI shows  osteomyelitis of the first metatarsal head. ?4/7: underwent angiography with Dr. Lucky Cowboy. Further revascularization is needed given findings of "..occlusion of the distal popliteal artery, occlusion of the tibioperoneal trunk, and occlusion of all 3 tibial vessels proximally with the posterior tibial artery being the only runoff to the foot distally after reconstitution in the proximal segment." ?4/10: underwent RLE angiogram with angioplasty to R posterior tibial artery, tibioperoneal trunk, and popliteal artery.  Stent placed in popliteal artery. ? ?--Consults: Podiatry, Vascular surgery, Infectious disease ?--Likely 1st ray amputation ?--No fever or leukocytosis, hold off antibiotics for now.  ID following   ?--Follow wound culture ?--Weightbearing as tolerated on the right foot in surgical shoe ? ?Hyponatremia ?Sodium 129 this morning.  Suspect due to poor p.o. intake.  Normal serum Osm 280, UrOsm 465, UrNa 48. ?-- Stop IV fluids ?-- Consider diuresis, but does not appear clinically hypervolemic ?-- Daily weights and strict I/O's ?-- Monitor BMP ? ?Chronic diastolic CHF (congestive heart failure) ([place]) ?Appears euvolemic to slightly dry. ?Echo 04/26/2021 with EF 60 to 65%, mild LVH, grade 1 diastolic dysfunction, mild AR. ?-- Monitor volume with strict I/O's and daily weights ?--May need diuresis if sodium continues to decline (?becoming hypervolemic) ? ?Acute urinary retention ?Notified by RN of pt difficulty voiding (4/8 afternoon).  Pt reports some difficulty with voiding prior to admission also. ?-- Started on Flomax. ?Patient required multiple in and out catheterizations. ?-- Foley placed afternoon of 4/9 ?-- Urology follow up outpatient ? ?Peripheral artery disease ([place]) ?With history of right great toe amputation secondary to osteomyelitis as complication of PAD.  Now with right  foot osteomyelitis, undergoing vascular evaluation and attempted revascularization. ?--Continue Eliquis, Lipitor ? ?Generalized  weakness ?PT and OT recommend SNF for rehab.  Patient's sister requested consideration for CIR, referral was placed by TOC.  If not accepted by CIR, prefer home with home health, declined SNF placement. ?--Continue PT and OT. ?-- Out of bed to chair ?-- Mobilize frequently as tolerated ?-- Fall precautions ? ?Open wound of right foot ?History of R great toe amputation due to osteomyelitis.  Hx of PAD.   ?Right foot xray without signs of osteo (4/4). ?Wound noted to be open and malodorous, no purulent drainage seen on my exam, serosanguinous drainage on gauze dressing.  MRI confirmed osteomyelitis. ?--See osteomyelitis ?--WOC consulted ?-- Continue wound care per instructions ?-- See osteomyelitis ? ?Elevated troponin ?No active chest pain or acute ischemic changes on EKG.  Cardiology consulted. ?Due to demand ischemia in the setting of acute pulmonary emboli. ?-- Per cardiology, okay off aspirin while on Eliquis ?-- Follow-up in cardiology clinic in 3 to 4 weeks ?-- Outpatient ischemic evaluation recommended ?-- Continue statin ? ?CVA (cerebral vascular accident) ([place]) ?As outlined under stroke. ?MRI brain negative for acute stroke. ? ?Seizure disorder ([place]) ?Continue Keppra ? ?HTN (hypertension) ?BP remains elevated on amlodipine 2.5 mg. ?-- Increase amlodipine to 5 mg daily ?-- As needed oral hydralazine for SBP >160 ? ?Stroke [facility]) ?Hx of CVA 2020.   ?Head CT on admission with subacute R cerebellar infarct, but MRI negative for acute CVA.   ?--Neurology consulted ?--Continue Eliquis, Lipitor ?--Stop ASA while on Eliquis, resume when off anticoagulation ?--PT/OT/SLP recommend SNF, family requesting CIR - referral placed for evaluation ? ?Diabetes mellitus type II, non insulin dependent ([place]) ?Hold metformin. ?CBGs are controlled without insulin coverage. ?Monitor CBGs and add sliding scale NovoLog if consistently above 180. ? ? ? ? ?  ? ?Subjective: Patient awake resting in bed when seen today.  No acute events  reported.  Patient denies fevers chills, pain, shortness of breath chest pain, nausea vomiting or other acute complaints.  Reports procedure went well yesterday. ? ?Physical Exam: ?Vitals:  ? 05/03/21 0035 05/03/21 0605 05/03/21 0847 05/03/21 1251  ?BP: (!) 155/80 134/81 136/78 (!) 141/77  ?Pulse: 75 80 73 80  ?Resp: 16 16 15 15   ?Temp: 98.6 ?F (37 ?C) 99.8 ?F (37.7 ?C) 98.4 ?F (36.9 ?C) 98 ?F (36.7 ?C)  ?TempSrc:   Oral   ?SpO2: 100% 100% 100% 99%  ?Weight:      ?Height:      ? ?General exam: awake, alert, no acute distress ?Respiratory system: normal respiratory effort, on room air. ?Cardiovascular system: normal S1/S2,  RRR ?Gastrointestinal system: soft, nondistended abdomen ?Central nervous system: no gross focal neurologic deficits, normal speech ?Extremities: Clean dry intact dressing on right foot, no edema, normal tone ?Skin: dry, intact, normal temperature ?Psychiatry: normal mood, congruent affect ? ? ?Data Reviewed: ? ?Notable labs: Sodium 129 from 131, chloride 97, glucose 193, BUN 21, creatinine 0.85, calcium 8.5.  Urine osmolality 465.  Urine sodium 48 ? ?Family Communication: None at bedside, will attempt to call sister as time allows this afternoon ? ?Disposition: ?Status is: Inpatient ?Remains inpatient appropriate because: Further procedure/surgery planned for right foot osteomyelitis ? ? Planned Discharge Destination:  CIR versus SNF ? ? ? ?Time spent: 35 minutes ? ?Author: ?Ezekiel Slocumb, DO ?05/03/2021 2:31 PM ? ?For on call review [email or web address].  ?

## 2021-05-03 NOTE — Progress Notes (Signed)
Inpatient Rehab Admissions Coordinator:  ? ?Pt. Is not yet medically ready for CIR. Procedure with podiatry remains pending. I will follow for potential admit pending medical readiness. ? ?Clemens Catholic, MS, CCC-SLP ?Rehab Admissions Coordinator  ?[phone] (celll) ?[phone] (office) ? ?

## 2021-05-03 NOTE — Progress Notes (Signed)
Physical Therapy Treatment ?Patient Details ?Name: [name] ?MRN: [id number] ?DOB: [birth year] ?Today's Date: 05/03/2021 ? ? ?History of Present Illness Patient is a 61 year old male with a PMH (+) for diabetes, prior CVA with weakness presents to the emergency department for generalized weakness. Small PE found on chest CT on 04/24/21. Pt diagnosed wtih right foot wound/bone infection and is s/p RLE vascular procedures 4/7 and 05/02/21. Per chart review further revascularization surgery and partial first ray revision amputation likely pending. ? ?  ?PT Comments  ? ? Pt was pleasant and motivated to participate during the session and put forth good effort throughout. Pt made good progress towards goals this session including requiring decreased physical assistance with bed mobility tasks and increased ambulation tolerance.  Pt was able to amb 30 feet this session with very slow cadence and short B step length but was steady throughout.  Pt reported no adverse symptoms during the session with SpO2 and HR WNL on room air.  Per chart review pt likely to have further surgery on his R great toe 05/04/21.  Pt will benefit from PT services in a SNF setting upon discharge to safely address deficits listed in patient problem list for decreased caregiver assistance and eventual return to PLOF. ? ?   ?Recommendations for follow up therapy are one component of a multi-disciplinary discharge planning process, led by the attending physician.  Recommendations may be updated based on patient status, additional functional criteria and insurance authorization. ? ?Follow Up Recommendations ? Skilled nursing-short term rehab (<3 hours/day) ?  ?  ?Assistance Recommended at Discharge Frequent or constant Supervision/Assistance  ?Patient can return home with the following Help with stairs or ramp for entrance;Assist for transportation;Assistance with cooking/housework;A little help with walking and/or transfers;A lot of help with  bathing/dressing/bathroom ?  ?Equipment Recommendations ? Other (comment) (TBD at next venue of care)  ?  ?Recommendations for Other Services   ? ? ?  ?Precautions / Restrictions Precautions ?Precautions: Fall ?Restrictions ?Weight Bearing Restrictions: Yes ?RLE Weight Bearing: Weight bearing as tolerated ?Other Position/Activity Restrictions: RLE WBAT with post-op shoe donned  ?  ? ?Mobility ? Bed Mobility ?Overal bed mobility: Needs Assistance ?  ?  ?  ?Supine to sit: Min assist ?  ?  ?General bed mobility comments: Min A for trunk control ?  ? ?Transfers ?Overall transfer level: Needs assistance ?Equipment used: Rolling walker (2 wheels) ?Transfers: Sit to/from Stand ?Sit to Stand: Min assist, From elevated surface ?  ?  ?  ?  ?  ?General transfer comment: Mod verbal cues for sequencing and min A for stability upon initial stand ?  ? ?Ambulation/Gait ?Ambulation/Gait assistance: Min assist ?Gait Distance (Feet): 30 Feet ?Assistive device: Rolling walker (2 wheels) ?Gait Pattern/deviations: Step-through pattern, Decreased step length - right, Decreased step length - left, Trunk flexed ?Gait velocity: decreased ?  ?  ?General Gait Details: Pt steady with amb with post-op shoe donned to R foot with min verbal cues for general sequencing and min A to guide the RW during sharp turns; very slow cadence and short B step length ? ? ?Stairs ?  ?  ?  ?  ?  ? ? ?Wheelchair Mobility ?  ? ?Modified Rankin (Stroke Patients Only) ?  ? ? ?  ?Balance Overall balance assessment: Needs assistance ?Sitting-balance support: Single extremity supported, Feet unsupported ?Sitting balance-Leahy Scale: Good ?  ?  ?Standing balance support: Bilateral upper extremity supported, During functional activity ?Standing balance-Leahy Scale: Poor ?Standing balance  comment: Min A for stability upon initial stand but then fair stability while ambulating ?  ?  ?  ?  ?  ?  ?  ?  ?  ?  ?  ?  ? ?  ?Cognition Arousal/Alertness: Awake/alert ?Behavior  During Therapy: [facility] for tasks assessed/performed ?Overall Cognitive Status: Within Functional Limits for tasks assessed ?  ?  ?  ?  ?  ?  ?  ?  ?  ?  ?  ?  ?  ?  ?  ?  ?  ?  ?  ? ?  ?Exercises Total Joint Exercises ?Ankle Circles/Pumps: AROM, Strengthening, Both, 10 reps ?Long Arc Quad: AROM, Strengthening, Both, 10 reps ?Knee Flexion: AROM, Strengthening, Both, 10 reps ? ?  ?General Comments   ?  ?  ? ?Pertinent Vitals/Pain Pain Assessment ?Pain Assessment: No/denies pain  ? ? ?Home Living   ?  ?  ?  ?  ?  ?  ?  ?  ?  ?   ?  ?Prior Function    ?  ?  ?   ? ?PT Goals (current goals can now be found in the care plan section) Progress towards PT goals: Progressing toward goals ? ?  ?Frequency ? ? ? Min 2X/week ? ? ? ?  ?PT Plan Current plan remains appropriate  ? ? ?Co-evaluation   ?  ?  ?  ?  ? ?  ?AM-PAC PT "6 Clicks" Mobility   ?Outcome Measure ? Help needed turning from your back to your side while in a flat bed without using bedrails?: A Little ?Help needed moving from lying on your back to sitting on the side of a flat bed without using bedrails?: A Little ?Help needed moving to and from a bed to a chair (including a wheelchair)?: A Little ?Help needed standing up from a chair using your arms (e.g., wheelchair or bedside chair)?: A Little ?Help needed to walk in hospital room?: A Little ?Help needed climbing 3-5 steps with a railing? : A Lot ?6 Click Score: 17 ? ?  ?End of Session Equipment Utilized During Treatment: Gait belt ?Activity Tolerance: Patient tolerated treatment well ?Patient left: in chair;with call bell/phone within reach;with chair alarm set ?Nurse Communication: Mobility status;Other (comment) (use of post-op shoe) ?PT Visit Diagnosis: Unsteadiness on feet (R26.81);Muscle weakness (generalized) (M62.81);Difficulty in walking, not elsewhere classified (R26.2) ?  ? ? ?Time: 4818-5631 ?PT Time Calculation (min) (ACUTE ONLY): 23 min ? ?Charges:  $Gait Training: 8-22 mins ?$Therapeutic Exercise: 8-22  mins          ?          ? ?D. Royetta Asal PT, DPT ?05/03/21, 4:38 PM ? ? ?

## 2021-05-03 NOTE — Progress Notes (Signed)
Patient ID: [name], male   DOB: March 21, 1960, 61 y.o.   MRN: [id number] ?Patient seen.  No complaints. ? ?Discussed with the patient that apparently Dr. Lucky Cowboy was able to improve his circulation.  Discussed the planned procedure for I&D with debridement of bone on the right foot.  Discussed possible risks and complications of the procedure including but not limited to inability of the foot to heal due to his peripheral vascular disease, diabetes, continued infection.  Discussed possible transfer lesions and continued swelling or pain.  Questions invited and answered.  Plan is for surgery tomorrow afternoon with Dr. Luana Shu.  Consent form for I&D with debridement bone right foot. ?

## 2021-05-03 NOTE — Progress Notes (Signed)
OT Cancellation Note ? ?Patient Details ?Name: Orlie Cundari ?MRN: [id number] ?DOB: 1960-07-28 ? ? ?Cancelled Treatment:    Reason Eval/Treat Not Completed: Other (comment). Pt working with PT, unavailable for OT tx. Will re-attempt at later date/time as pt is available.  ? ?Ardeth Perfect., MPH, MS, OTR/L ?ascom [phone] ?05/03/21, 4:21 PM ?

## 2021-05-04 ENCOUNTER — Inpatient Hospital Stay: Payer: Medicare Other | Admitting: Certified Registered Nurse Anesthetist

## 2021-05-04 ENCOUNTER — Inpatient Hospital Stay: Payer: Medicare Other

## 2021-05-04 ENCOUNTER — Other Ambulatory Visit: Payer: Self-pay

## 2021-05-04 ENCOUNTER — Encounter
Admission: EM | Disposition: A | Discharge status: Rehabilitation Facility | Payer: Self-pay | Source: Home / Self Care | Attending: Internal Medicine

## 2021-05-04 DIAGNOSIS — R338 Other retention of urine: Secondary | ICD-10-CM | POA: Diagnosis not present

## 2021-05-04 DIAGNOSIS — I5032 Chronic diastolic (congestive) heart failure: Secondary | ICD-10-CM

## 2021-05-04 DIAGNOSIS — I2699 Other pulmonary embolism without acute cor pulmonale: Secondary | ICD-10-CM | POA: Diagnosis not present

## 2021-05-04 DIAGNOSIS — I10 Essential (primary) hypertension: Secondary | ICD-10-CM

## 2021-05-04 DIAGNOSIS — M86671 Other chronic osteomyelitis, right ankle and foot: Secondary | ICD-10-CM | POA: Diagnosis not present

## 2021-05-04 HISTORY — PX: AMPUTATION: SHX166

## 2021-05-04 LAB — BASIC METABOLIC PANEL
Anion gap: 6 (ref 5–15)
BUN: 15 mg/dL (ref 6–20)
CO2: 27 mmol/L (ref 22–32)
Calcium: 8.7 mg/dL — ABNORMAL LOW (ref 8.9–10.3)
Chloride: 97 mmol/L — ABNORMAL LOW (ref 98–111)
Creatinine, Ser: 0.69 mg/dL (ref 0.61–1.24)
GFR, Estimated: >60 mL/min (ref >60)
Glucose, Bld: 158 mg/dL — ABNORMAL HIGH (ref 70–99)
Potassium: 4.1 mmol/L (ref 3.5–5.1)
Sodium: 130 mmol/L — ABNORMAL LOW (ref 135–145)

## 2021-05-04 LAB — GLUCOSE, CAPILLARY
Glucose-Capillary: 95 mg/dL (ref 70–99)
Glucose-Capillary: 94 mg/dL (ref 70–99)

## 2021-05-04 LAB — SURGICAL PCR SCREEN
MRSA, PCR: NEGATIVE
Staphylococcus aureus: NEGATIVE

## 2021-05-04 LAB — MAGNESIUM: Magnesium: 2 mg/dL (ref 1.7–2.4)

## 2021-05-04 SURGERY — AMPUTATION, FOOT, RAY
Anesthesia: General | Site: Foot | Laterality: Right

## 2021-05-04 MED ORDER — MIDAZOLAM HCL 2 MG/2ML IJ SOLN
INTRAMUSCULAR | Status: AC
  Filled 2021-05-04: qty 2

## 2021-05-04 MED ORDER — FENTANYL CITRATE (PF) 100 MCG/2ML IJ SOLN: 25.0000 ug | INTRAMUSCULAR | Status: DC | PRN

## 2021-05-04 MED ORDER — NEOMYCIN-POLYMYXIN B GU 40-200000 IR SOLN
Status: AC
  Filled 2021-05-04: qty 1

## 2021-05-04 MED ORDER — 0.9 % SODIUM CHLORIDE (POUR BTL) OPTIME
TOPICAL | Status: DC | PRN
  Administered 2021-05-04: 250 mL

## 2021-05-04 MED ORDER — PROPOFOL 10 MG/ML IV BOLUS
INTRAVENOUS | Status: AC
  Filled 2021-05-04: qty 20

## 2021-05-04 MED ORDER — BUPIVACAINE HCL (PF) 0.5 % IJ SOLN
INTRAMUSCULAR | Status: AC
  Filled 2021-05-04: qty 30

## 2021-05-04 MED ORDER — VANCOMYCIN HCL 1000 MG IV SOLR
INTRAVENOUS | Status: AC
  Filled 2021-05-04: qty 20

## 2021-05-04 MED ORDER — ONDANSETRON HCL 4 MG/2ML IJ SOLN
INTRAMUSCULAR | Status: AC
  Filled 2021-05-04: qty 2

## 2021-05-04 MED ORDER — OXYCODONE HCL 5 MG/5ML PO SOLN: 5.0000 mg | Freq: Once | ORAL | Status: DC | PRN

## 2021-05-04 MED ORDER — ONDANSETRON HCL 4 MG/2ML IJ SOLN: 4.0000 mg | Freq: Once | INTRAMUSCULAR | Status: DC | PRN

## 2021-05-04 MED ORDER — PROPOFOL 10 MG/ML IV BOLUS
INTRAVENOUS | Status: DC | PRN
  Administered 2021-05-04: 120 ug/kg/min via INTRAVENOUS
  Administered 2021-05-04: 60 mg via INTRAVENOUS

## 2021-05-04 MED ORDER — CEFAZOLIN SODIUM-DEXTROSE 2-4 GM/100ML-% IV SOLN
INTRAVENOUS | Status: AC
  Filled 2021-05-04: qty 100

## 2021-05-04 MED ORDER — BUPIVACAINE HCL 0.5 % IJ SOLN
INTRAMUSCULAR | Status: DC | PRN
Start: 2021-05-04 — End: 2021-05-04
  Administered 2021-05-04: 20 mL

## 2021-05-04 MED ORDER — ACETAMINOPHEN 10 MG/ML IV SOLN: 1000.0000 mg | Freq: Once | INTRAVENOUS | Status: DC | PRN

## 2021-05-04 MED ORDER — OXYCODONE HCL 5 MG PO TABS: 5.0000 mg | ORAL_TABLET | Freq: Once | ORAL | Status: DC | PRN

## 2021-05-04 MED ORDER — FENTANYL CITRATE (PF) 100 MCG/2ML IJ SOLN
INTRAMUSCULAR | Status: AC
Start: 2021-05-04 — End: ?
  Filled 2021-05-04: qty 2

## 2021-05-04 MED ORDER — LIDOCAINE HCL (PF) 2 % IJ SOLN
INTRAMUSCULAR | Status: AC
  Filled 2021-05-04: qty 5

## 2021-05-04 MED ORDER — SODIUM CHLORIDE 0.9 % IV SOLN: INTRAVENOUS | Status: DC | PRN

## 2021-05-04 MED ORDER — FENTANYL CITRATE (PF) 100 MCG/2ML IJ SOLN
INTRAMUSCULAR | Status: AC
  Filled 2021-05-04: qty 2

## 2021-05-04 MED ORDER — LIDOCAINE HCL (CARDIAC) PF 100 MG/5ML IV SOSY
PREFILLED_SYRINGE | INTRAVENOUS | Status: DC | PRN
  Administered 2021-05-04: 60 mg via INTRAVENOUS

## 2021-05-04 SURGICAL SUPPLY — 46 items
BLADE MED AGGRESSIVE (BLADE) ×1 IMPLANT
BLADE SURG 15 STRL LF DISP TIS (BLADE) IMPLANT
BLADE SURG 15 STRL SS (BLADE)
BLADE SURG MINI STRL (BLADE) IMPLANT
BNDG CONFORM 2 STRL LF (GAUZE/BANDAGES/DRESSINGS) IMPLANT
BNDG ELASTIC 4X5.8 VLCR STR LF (GAUZE/BANDAGES/DRESSINGS) ×2 IMPLANT
BNDG ESMARK 4X12 TAN STRL LF (GAUZE/BANDAGES/DRESSINGS) ×2 IMPLANT
BNDG GAUZE ELAST 4 BULKY (GAUZE/BANDAGES/DRESSINGS) ×2 IMPLANT
CNTNR SPEC 2.5X3XGRAD LEK (MISCELLANEOUS)
CONT SPEC 4OZ STER OR WHT (MISCELLANEOUS)
CONT SPEC 4OZ STRL OR WHT (MISCELLANEOUS)
CONTAINER SPEC 2.5X3XGRAD LEK (MISCELLANEOUS) ×1 IMPLANT
CUFF TOURN SGL QUICK 12 (TOURNIQUET CUFF) IMPLANT
CUFF TOURN SGL QUICK 18X4 (TOURNIQUET CUFF) IMPLANT
DRAPE FLUOR MINI C-ARM 54X84 (DRAPES) IMPLANT
DURAPREP 26ML APPLICATOR (WOUND CARE) ×1 IMPLANT
ELECT REM PT RETURN 9FT ADLT (ELECTROSURGICAL) ×2
ELECTRODE REM PT RTRN 9FT ADLT (ELECTROSURGICAL) ×1 IMPLANT
GAUZE SPONGE 4X4 12PLY STRL (GAUZE/BANDAGES/DRESSINGS) ×4 IMPLANT
GAUZE XEROFORM 1X8 LF (GAUZE/BANDAGES/DRESSINGS) ×2 IMPLANT
GLOVE SURG ENC MOIS LTX SZ7 (GLOVE) ×2 IMPLANT
GLOVE SURG UNDER LTX SZ7 (GLOVE) ×2 IMPLANT
GOWN STRL REUS W/ TWL LRG LVL3 (GOWN DISPOSABLE) ×2 IMPLANT
GOWN STRL REUS W/TWL LRG LVL3 (GOWN DISPOSABLE) ×4
HANDPIECE VERSAJET DEBRIDEMENT (MISCELLANEOUS) IMPLANT
IV NS IRRIG 3000ML ARTHROMATIC (IV SOLUTION) IMPLANT
KIT TURNOVER KIT A (KITS) ×2 IMPLANT
LABEL OR SOLS (LABEL) ×2 IMPLANT
MANIFOLD NEPTUNE II (INSTRUMENTS) ×2 IMPLANT
NDL FILTER BLUNT 18X1 1/2 (NEEDLE) ×1 IMPLANT
NDL HYPO 25X1 1.5 SAFETY (NEEDLE) ×2 IMPLANT
NEEDLE FILTER BLUNT 18X 1/2SAF (NEEDLE) ×1
NEEDLE FILTER BLUNT 18X1 1/2 (NEEDLE) ×1 IMPLANT
NEEDLE HYPO 25X1 1.5 SAFETY (NEEDLE) ×4 IMPLANT
NS IRRIG 500ML POUR BTL (IV SOLUTION) ×2 IMPLANT
PACK EXTREMITY ARMC (MISCELLANEOUS) ×2 IMPLANT
SOL PREP PVP 2OZ (MISCELLANEOUS) ×2
SOLUTION PREP PVP 2OZ (MISCELLANEOUS) ×1 IMPLANT
STOCKINETTE STRL 6IN 960660 (GAUZE/BANDAGES/DRESSINGS) ×2 IMPLANT
SUT ETHILON 3-0 FS-10 30 BLK (SUTURE) ×4
SUT VIC AB 3-0 SH 27 (SUTURE) ×2
SUT VIC AB 3-0 SH 27X BRD (SUTURE) ×1 IMPLANT
SUTURE EHLN 3-0 FS-10 30 BLK (SUTURE) ×2 IMPLANT
SWAB CULTURE AMIES ANAERIB BLU (MISCELLANEOUS) IMPLANT
SYR 10ML LL (SYRINGE) ×2 IMPLANT
WATER STERILE IRR 500ML POUR (IV SOLUTION) ×2 IMPLANT

## 2021-05-04 NOTE — TOC Progression Note (Signed)
Transition of Care (TOC) - Progression Note  ? ? ?Patient Details  ?Name: [name] ?MRN: [id number] ?Date of Birth: [birth year] ? ?Transition of Care (TOC) CM/SW Contact  ?Kerin Salen, RN ?Phone Number: ?05/04/2021, 1:54 PM ? ?Clinical Narrative: Spoke with CIR Coordinator about IR admission, was informed after all surgical interventions are complete and patient is medically stable will evaluate for admission to CIR.  ? ? ? ?Expected Discharge Plan:  (TBD) ?Barriers to Discharge: Continued Medical Work up ? ?Expected Discharge Plan and Services ?Expected Discharge Plan:  (TBD) ?  ?  ?Post Acute Care Choice:  (TBD) ?Living arrangements for the past 2 months: Apartment ?                ?  ?  ?  ?  ?  ?  ?  ?  ?  ?  ? ? ?Social Determinants of Health (SDOH) Interventions ?  ? ?Readmission Risk Interventions ?   ? View : No data to display.  ?  ?  ?  ? ? ?

## 2021-05-04 NOTE — Progress Notes (Signed)
[place] [facility] ? ? ?PATIENT NAME: [name]   ? ?MR#:  [id number] ? ?DATE OF BIRTH:  12-Dec-1960 ? ?SUBJECTIVE:  ? ?no family in the room. Patient awaiting foot surgery today. ? ?VITALS:  ?Blood pressure 135/79, pulse 77, temperature 99.9 ?F (37.7 ?C), resp. rate 16, height 5\' 7"  (1.702 m), weight 70 kg, SpO2 99 %. ? ?PHYSICAL EXAMINATION:  ? ?GENERAL:  61 y.o.-year-old patient lying in the bed with no acute distress. Appears chronically ill ?LUNGS: Normal breath sounds bilaterally, no wheezing, rales, rhonchi.  ?CARDIOVASCULAR: S1, S2 normal. No murmurs, rubs, or gallops.  ?ABDOMEN: Soft, nontender, nondistended. Bowel sounds present.  ?EXTREMITIES: lower extremity right foot dressing present. ?NEUROLOGIC: nonfocal  patient is alert and awake ? ?LABORATORY PANEL:  ?CBC ?Recent Labs  ?Lab 04/29/21 ?4196  ?WBC 5.2  ?HGB 14.2  ?HCT 42.3  ?PLT 223  ? ? ?Chemistries  ?Recent Labs  ?Lab 05/04/21 ?0314  ?NA 130*  ?K 4.1  ?CL 97*  ?CO2 27  ?GLUCOSE 158*  ?BUN 15  ?CREATININE 0.69  ?CALCIUM 8.7*  ?MG 2.0  ? ?Cardiac Enzymes ?No results for input(s): TROPONINI in the last 168 hours. ?RADIOLOGY:  ?PERIPHERAL VASCULAR CATHETERIZATION ? ?Result Date: 05/02/2021 ?See surgical note for result.  ? ?Assessment and Plan ? ?[name] is a 61 y.o. male with medical history significant of hypertension, hyperlipidemia, diabetes mellitus, CVA 2020, osteomyelitis s/p  right 2nd s/p great toe amputation who presented to the ED via EMS with complaints of worsening generalized weakness for 2-3 days. EMS reported found patient laying in urine soaked clothing.  Patient reported no acute complaints other than profound fatigue ? ?CT head showed new subacute to chronic right cerebellar infarct and chronic small vessel ischemic disease and generalized atrophy.   ?  ?Subsequent CTA chest revealed bilateral pulmonary emboli with no evidence of right heart strain.  Started on heparin infusion and has since  been transitioned to Eliquis (started 4/3 PM).  Lower extremity doppler U/S negative for DVT's. ?  ?Assessment and Plan: ? ? ?*Acute pulmonary embolism ([place]) ?--Patient presented with profound fatigue and generalized weakness, failure to thrive x3 days.  There was concern for acute stroke but MRI was negative and patient did not have any new focal neurologic deficits.  ?CTA chest showed pulmonary emboli in the distal right pulmonary artery, proximal right upper middle and lower lobe pulmonary artery branches.  No evidence of right heart strain. ?--Lower extremity Doppler ultrasound negative for DVTs bilaterally. ?--Was on heparin drip.   ?--Transitioned to Eliquis. ?-- Continue Eliquis 10 mg PO BID x 7 days, then 5 mg PO BID--pharmacy to manage ?  ?Foot osteomyelitis, right ([name]) ?--History of prior right great toe amputation due to osteomyelitis in the setting of PAD.  Patient  Has open wound at the site, foul odor and localized swelling.  MRI shows osteomyelitis of the first metatarsal head. ?4/7: underwent angiography with Dr. Lucky Cowboy. Further revascularization is needed given findings of "..occlusion of the distal popliteal artery, occlusion of the tibioperoneal trunk, and occlusion of all 3 tibial vessels proximally with the posterior tibial artery being the only runoff to the foot distally after reconstitution in the proximal segment." ?4/10: underwent RLE angiogram with angioplasty to R posterior tibial artery, tibioperoneal trunk, and popliteal artery.  Stent placed in popliteal artery. ?4/11: pt scheduled for IND with debridement  right foot per Dr Luana Shu with possible likely 1st ray amputation ?  ?--Consults: Podiatry, Vascular  surgery, Infectious disease ?--Weightbearing as tolerated on the right foot in surgical shoe ?  ?Hyponatremia ?Sodium 129  (4/11) Suspect due to poor p.o. intake.  Normal serum Osm 280, UrOsm 465, UrNa 48. ?-- Stop IV fluids ?-- Consider diuresis, but does not appear clinically  hypervolemic ?-- Daily weights and strict I/O's ?-- sodium 130 today ?  ?Chronic diastolic CHF (congestive heart failure) ([place]) ?--Appears euvolemic to slightly dry. ?--Echo 04/26/2021 with EF 60 to 65%, mild LVH, grade 1 diastolic dysfunction, mild AR. ?-- Monitor volume with strict I/O's and daily weights ? ?Acute urinary retention ?Notified by RN of pt difficulty voiding (4/8 afternoon).  Pt reports some difficulty with voiding prior to admission also. ?-- Started on Flomax. ?Patient required multiple in and out catheterizations. ?-- Foley placed afternoon of 4/9 ?-- Urology follow up outpatient ?  ?Peripheral artery disease ([place]) ?--With history of right great toe amputation secondary to osteomyelitis as complication of PAD.  Now with right foot osteomyelitis, undergoing vascular evaluation and attempted revascularization. ?--Continue Eliquis, Lipitor ?  ?Generalized weakness ?PT and OT recommend SNF for rehab.  Patient's sister requested consideration for CIR, referral was placed by TOC.  If not accepted by CIR, prefer home with home health, declined SNF placement. ?--Continue PT and OT. ?-- Out of bed to chair ?-- Mobilize frequently as tolerated ?-- Fall precautions ?  ?Open wound of right foot ?History of R great toe amputation due to osteomyelitis.  Hx of PAD.   ?Right foot xray without signs of osteo (4/4). ?Wound noted to be open and malodorous, no purulent drainage seen on my exam, serosanguinous drainage on gauze dressing.  MRI confirmed osteomyelitis. ?--See osteomyelitis ?--WOC consulted ?-- Continue wound care per instructions ?-- See osteomyelitis ?   ?Seizure disorder ([place]) ?Continue Keppra ?  ?HTN (hypertension) ?BP remains elevated on amlodipine 2.5 mg. ?-- Increase amlodipine to 5 mg daily ?-- As needed oral hydralazine for SBP >160 ?  ?Stroke [facility]) ?Hx of CVA 2020.   ?Head CT on admission with subacute R cerebellar infarct, but MRI negative for acute CVA.   ?--Neurology consulted ?--Continue  Eliquis, Lipitor ?--Stop ASA while on Eliquis, resume when off anticoagulation ?--PT/OT/SLP recommend SNF, family requesting CIR - referral placed for evaluation ?  ?Diabetes mellitus type II, non insulin dependent ([place]) ?Hold metformin. ?CBGs are controlled without insulin coverage. ?Monitor CBGs and add sliding scale NovoLog if consistently above 180. ?  ?  ?  ? ? ? ?Procedures: as above ?Family communication :none today ?Consults :vascular, podiatry, infectious disease ?CODE STATUS: full ?DVT Prophylaxis : ?Level of care: Med-Surg ?Status is: Inpatient ?Remains inpatient appropriate because: ongoing workup for right foot osteomyelitis ?  ? ?TOTAL TIME TAKING CARE OF THIS PATIENT: 25 minutes.  ?>50% time spent on counselling and coordination of care ? ?Note: This dictation was prepared with Dragon dictation along with smaller phrase technology. Any transcriptional errors that result from this process are unintentional. ? ?Fritzi Mandes M.D  ? ? ?[facility]  ? ?CC: ?Primary care physician; Glendon Axe, MD  ?

## 2021-05-04 NOTE — Anesthesia Preprocedure Evaluation (Addendum)
Anesthesia Evaluation  ?Patient identified by MRN, date of birth, ID band ?Patient awake ? ?General Assessment Comment:Patient AOx3, but lethargic and slow to respond. Slurred speech at baseline. Poor historian ? ?Reviewed: ?Allergy & Precautions, H&P , NPO status , Patient's Chart, lab work & pertinent test results, reviewed documented beta blocker date and time  ? ?History of Anesthesia Complications ?Negative for: history of anesthetic complications ? ?Airway ?Mallampati: III ? ?TM Distance: >3 FB ?Neck ROM: full ? ? ? Dental ? ?(+) Poor Dentition, Missing ?  ?Pulmonary ?neg pulmonary ROS, Current Smoker and Patient abstained from smoking.,  ?  ?Pulmonary exam normal ?breath sounds clear to auscultation ? ? ? ? ? ? Cardiovascular ?Exercise Tolerance: Poor ?hypertension, On Medications ?+ Peripheral Vascular Disease and +CHF  ?Normal cardiovascular exam ?Rhythm:Regular Rate:Normal ? ? ?  ?Neuro/Psych ?Seizures -,  TIACVA, Residual Symptoms negative psych ROS  ? GI/Hepatic ?negative GI ROS, Neg liver ROS,   ?Endo/Other  ?negative endocrine ROSdiabetes, Poorly Controlled ? Renal/GU ?negative Renal ROS  ?negative genitourinary ?  ?Musculoskeletal ? ? Abdominal ?  ?Peds ? Hematology ?negative hematology ROS ?(+)   ?Anesthesia Other Findings ?Per most recent anesthetic evaluation at [facility]: ?61 y.o. male who is ASA 4 with the following co morbidities: ?1. Cellulitis of second toe of right foot ?Date not yet scheduled ?- Request for Pre-Anesthesia Testing ?- Hemoglobin A1c ?- Sedimentation Rate ?- CBC w/ Differential ?- Basic metabolic panel ? ?2. Osteomyelitis of ankle and foot (CMS-HCC) ? ?- Request for Pre-Anesthesia Testing ?- Sedimentation Rate ?- CBC w/ Differential ?- Basic metabolic panel ? ?3. 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 Continues to smoke tobacco, down to 1 ppd. Hit or miss  with taking plavix as well.  ? ?CT Angio 2020 ? ?Area of ischemia in the anterior left frontal lobe. There is some  ?bilateral increased T-max which is likely artifactual.  ?2. No significant core infarct.  ?3. Question luxury perfusion over the left hemisphere consistent  ?with recent ischemia.  ?4. 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  ?5. The left vertebral artery is patent. There is flow in the?distal  ?right V4 segment to the posterior communicating artery.  ?6. 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  ?7. Prominent posterior communicating arteries bilaterally.  ?8. No other significant proximal large vessel occlusion.  ?9. No significant carotid stenosis in the neck. There is mild  ?tortuosity.  ?10. Minimal atherosclerotic changes at the aortic arch without  ?significant stenosis.  ? ?4. Type 2 diabetes mellitus with other circulatory complication, without long-term current use of insulin (CMS-HCC) ?On metformin, labs sent today ? ?5. Seizure (CMS-HCC) ?Had witnessed seizures in 2020. Prescribed keppra, but has not taken in 2 months since he does not have medicine at home ? ?6. HTN Prescribed amlodipine, but does not currently have any medication at home.   ? ? Reproductive/Obstetrics ?negative OB ROS ? ?  ? ? ? ? ? ? ? ? ? ? ? ? ? ?  ?  ? ? ? ? ? ? ?Anesthesia Physical ?Anesthesia Plan ? ?ASA: 4 ? ?Anesthesia Plan: General  ? ?Post-op Pain Management: Ofirmev IV (intra-op)*  ? ?Induction: Intravenous ? ?PONV Risk Score and Plan: 1 and Ondansetron, TIVA, Propofol infusion and Treatment may vary due to age or medical condition ? ?Airway Management Planned: LMA and Natural Airway ? ?Additional Equipment: None ? ?Intra-op Plan:  ? ?  Post-operative Plan: Extubation in OR ? ?Informed Consent: I have reviewed the patients History and Physical, chart, labs and discussed the procedure including the risks, benefits and alternatives for the proposed anesthesia  with the patient or authorized representative who has indicated his/her understanding and acceptance.  ? ? ? ?Dental advisory given ? ?Plan Discussed with: CRNA ? ?Anesthesia Plan Comments: (Discussed risks of anesthesia with patient, including PONV, sore throat, lip/dental/eye damage. Rare risks discussed as well, such as cardiorespiratory and neurological sequelae, and allergic reactions. Discussed the role of CRNA in patient's perioperative care. Patient understands. ?LMA vs natural airway.)  ? ? ? ? ? ?Anesthesia Quick Evaluation ? ?

## 2021-05-04 NOTE — Op Note (Addendum)
PODIATRY / FOOT AND ANKLE SURGERY OPERATIVE REPORT ? ? ? ?SURGEON: Caroline More, DPM ? ?PRE-OPERATIVE DIAGNOSIS:  ?1.  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  PVD ?3.  Diabetes type 2 polyneuropathy ? ?POST-OPERATIVE DIAGNOSIS: Same ? ?PROCEDURE(S): ?Right partial first ray amputation with excision of wound and closure ? ?HEMOSTASIS: Right ankle tourniquet ? ?ANESTHESIA: MAC ? ?ESTIMATED BLOOD LOSS: 30 cc ? ?FINDING(S): ?1.  Right first metatarsal head with exposed bone consistent with osteomyelitis ? ?PATHOLOGY/SPECIMEN(S): Right first metatarsal proximal margin purple ink ? ?INDICATIONS:   ?Bulmaro Feagans is a 61 y.o. male who presents with a nonhealing wound to the right first metatarsal.  Patient had previous first and second toe amputations performed and subsequently ended up with dehiscence of the first metatarsal wound with bone exposed.  Patient presented to [facility] due to another issue in which she was having trouble breathing and was diagnosed with PE.  Patient has been taking anticoagulation therapy since that time.  Patient was also noted to have nonpalpable pulses and did undergo 2 angiograms with intervention to improve the circulation of the right lower extremity.  Vascular believes that his circulation has been improved to the point to undergo revision amputation.  All treatment options were discussed with the patient both conservative and surgical attempts at correction include potential risks and complications.  At this time patient is elected for surgical intervention. ? ?DESCRIPTION: ?After obtaining full informed written consent, the patient was brought back to the operating room and placed supine upon the operating table.  The patient received IV antibiotics prior to induction.  After obtaining adequate anesthesia, a Mayo type block was performed with 20 cc of half percent Marcaine plain.  The patient  was prepped and draped in the standard fashion. ? ?An incision was drawn out over the first metatarsal head area and the area of the wound excising the wound but also extending the incision of the medial aspect of the first metatarsal.  An elliptical type of incision was made around the wound.  The incision was made straight to bone.  A substantial amount of bleeding occurred during this.  The skin was ellipsed and passed off the operative site.  Circumferential dissection was then performed around the first metatarsal head.  Again substantial bleeding was occurring.  Hemostasis was attempted to be achieved with electrocauterization but patient continued to bleed significantly likely due to him being on blood thinners.  At this time an Esmarch bandage was used to exsanguinate the right lower extremity and the pneumatic ankle tourniquet was inflated for a brief period of time for direct visualization and cauterization.  The first metatarsal head was then resected with a sagittal bone saw with the appropriate beveling and the sesamoids were resected also and passed off the operative site.  The surgical site was flushed with copious amounts normal sterile saline and any bleeding vessels were cauterized with Bovie.  The flaps were then remodeled and deep closure was obtained with 3-0 Vicryl.  The pneumatic ankle tourniquet was deflated and a prompt hyperemic response was noted all digits of the right foot.  The bleeding appeared to be more under control at this time and any bleeding vessels were yet cauterized further with electrocauterization Bovie.  The skin was then reapproximated well coapted with combination of simple and horizontal mattress stitching with 3-0 nylon.  A postoperative dressing was then applied consisting of Xeroform followed by 4 x 4 gauze,  Kerlix, Ace wrap with minimal compression. ? ?Patient to be discharged back to the inpatient room with the appropriate orders and instructions.  Patient may  maintain partial weightbearing with heel contact in a surgical shoe.  Patient to keep dressings clean, dry, and intact.  We will check on patient again tomorrow and likely sign off on patient after that time.  Believe that all infection was removed with amputation today. ? ?COMPLICATIONS: None ? ?CONDITION: Good, stable ? ?Caroline More, DPM ? ?

## 2021-05-04 NOTE — Transfer of Care (Signed)
Immediate Anesthesia Transfer of Care Note ? ?Patient: [name] ? ?Procedure(s) Performed: AMPUTATION RAY-Partial (Right: Foot) ? ?Patient Location: PACU ? ?Anesthesia Type:General ? ?Level of Consciousness: drowsy ? ?Airway & Oxygen Therapy: Patient Spontanous Breathing and Patient connected to nasal cannula oxygen ? ?Post-op Assessment: Report given to RN and Post -op Vital signs reviewed and stable ? ?Post vital signs: Reviewed and stable ? ?Last Vitals:  ?Vitals Value Taken Time  ?BP 107/74 05/04/21 1745  ?Temp 36.6 ?C 05/04/21 1745  ?Pulse 70 05/04/21 1747  ?Resp 19 05/04/21 1747  ?SpO2 100 % 05/04/21 1747  ?Vitals shown include unvalidated device data. ? ?Last Pain:  ?Vitals:  ? 05/04/21 1612  ?TempSrc: Oral  ?PainSc: 0-No pain  ?   ? ?  ? ?Complications: No notable events documented. ?

## 2021-05-04 NOTE — Progress Notes (Signed)
Inpatient Rehab Admissions Coordinator:  ? ? I do not have a CIR bed for this Pt. At this time. Following for potential admit once all surgical interventions are completed.  ? ?Clemens Catholic, MS, CCC-SLP ?Rehab Admissions Coordinator  ?[phone] (celll) ?[phone] (office) ? ?

## 2021-05-04 NOTE — Anesthesia Postprocedure Evaluation (Signed)
Anesthesia Post Note ? ?Patient: [name] ? ?Procedure(s) Performed: AMPUTATION RAY-Partial (Right: Foot) ? ?Patient location during evaluation: PACU ?Anesthesia Type: General ?Level of consciousness: awake and alert ?Pain management: pain level controlled ?Vital Signs Assessment: post-procedure vital signs reviewed and stable ?Respiratory status: spontaneous breathing, nonlabored ventilation, respiratory function stable and patient connected to nasal cannula oxygen ?Cardiovascular status: blood pressure returned to baseline and stable ?Postop Assessment: no apparent nausea or vomiting ?Anesthetic complications: no ? ? ?No notable events documented. ? ? ?Last Vitals:  ?Vitals:  ? 05/04/21 1815 05/04/21 1829  ?BP: 136/80 (!) 145/79  ?Pulse: 72 73  ?Resp: 19 17  ?Temp:  36.7 ?C  ?SpO2: 100% 100%  ?  ?Last Pain:  ?Vitals:  ? 05/04/21 1829  ?TempSrc:   ?PainSc: 0-No pain  ? ? ?  ?  ?  ?  ?  ?  ? ?Arita Miss ? ? ? ? ?

## 2021-05-04 NOTE — H&P (Signed)
HISTORY AND PHYSICAL INTERVAL NOTE: ? ?05/04/2021 ? ?4:42 PM ? ?[name]  has presented today for surgery, with the diagnosis of right 1st metatarsal osteomyelitis, DM2 polyneuropathy, PVD. The various methods of treatment have been discussed with the patient.  No guarantees were given.  After consideration of risks, benefits and other options for treatment, the patient has consented to surgery.  I have reviewed the patients? chart and labs.   ? ?PROCEDURE: ?RIGHT PARTIAL 1ST RAY AMPUTATION REVISION WITH EXCISION OF WOUND AND CLOSURE ? ? ?A history and physical examination was performed in the hospital.  The patient was reexamined.  There have been no changes to this history and physical examination. ? ?Caroline More, DPM ? ?

## 2021-05-04 NOTE — Anesthesia Procedure Notes (Signed)
Procedure Name: Kinney ?Date/Time: 05/04/2021 5:55 PM ?Performed by: Tollie Eth, CRNA ?Pre-anesthesia Checklist: Patient identified, Emergency Drugs available, Suction available and Patient being monitored ?Patient Re-evaluated:Patient Re-evaluated prior to induction ?Oxygen Delivery Method: Nasal cannula ?Induction Type: IV induction ?Placement Confirmation: positive ETCO2 ? ? ? ? ?

## 2021-05-04 NOTE — Progress Notes (Signed)
OT Cancellation Note ? ?Patient Details ?Name: Marlan Steward ?MRN: [id number] ?DOB: 09/04/1960 ? ? ?Cancelled Treatment:    Reason Eval/Treat Not Completed: Medical issues which prohibited therapy;Patient at procedure or test/ unavailable (Pt. is having a Partial ray 1st ray amputaion revision today. Will await new orders for OT to resume once medically ready.)  ? ?Harrel Carina, MS, OTR/L ?05/04/2021, 4:06 PM ?

## 2021-05-05 ENCOUNTER — Encounter: Payer: Self-pay | Admitting: Podiatry

## 2021-05-05 DIAGNOSIS — S81801A Unspecified open wound, right lower leg, initial encounter: Secondary | ICD-10-CM | POA: Diagnosis not present

## 2021-05-05 DIAGNOSIS — I2699 Other pulmonary embolism without acute cor pulmonale: Secondary | ICD-10-CM | POA: Diagnosis not present

## 2021-05-05 DIAGNOSIS — I5032 Chronic diastolic (congestive) heart failure: Secondary | ICD-10-CM | POA: Diagnosis not present

## 2021-05-05 DIAGNOSIS — R338 Other retention of urine: Secondary | ICD-10-CM | POA: Diagnosis not present

## 2021-05-05 DIAGNOSIS — M86671 Other chronic osteomyelitis, right ankle and foot: Secondary | ICD-10-CM | POA: Diagnosis not present

## 2021-05-05 NOTE — Progress Notes (Signed)
PT Cancellation Note ? ?Patient Details ?Name: [name] ?MRN: [id number] ?DOB: 07-10-1960 ? ? ?Cancelled Treatment:    Reason Eval/Treat Not Completed: Other (comment).  Chart reviewed and attempted to see pt.  Pt actively eating breakfast and requested to not perform therapy at this time.  Will re-attempt to see pt as medically necessary. ? ? ?Gwenlyn Saran, PT, DPT ?05/05/21, 2:13 PM ? ?

## 2021-05-05 NOTE — Evaluation (Signed)
Physical Therapy RE-Evaluation ?Patient Details ?Name: [name] ?MRN: [id number] ?DOB: July 16, 1960 ?Today's Date: 05/05/2021 ? ?History of Present Illness ? Patient is a 61 year old male with a PMH (+) for diabetes, prior CVA with weakness presents to the emergency department for generalized weakness. Small PE found on chest CT on 04/24/21. Pt diagnosed wtih right foot wound/bone infection and is s/p RLE vascular procedures 4/7 and 05/02/21.  Revascularization surgery and partial first Oluwaseyi Tull revision amputation performed on 05/04/21.  ?Clinical Impression ? Pt is a pleasant 61 year old male who was admitted for R foot infection s/p vascular procedure. Amputation performed 05/04/21. Pt performs bed mobility with min assist, transfers with mod assist, and ambulation with min assist +2 and RW. Pt demonstrates deficits with strength/mobility/balance. Pt is very high falls risk and needs assist for all mobility.  Would benefit from skilled PT to address above deficits and promote optimal return to PLOF; recommend transition to STR upon discharge from acute hospitalization. ? ?   ? ?Recommendations for follow up therapy are one component of a multi-disciplinary discharge planning process, led by the attending physician.  Recommendations may be updated based on patient status, additional functional criteria and insurance authorization. ? ?Follow Up Recommendations Skilled nursing-short term rehab (<3 hours/day) ? ?  ?Assistance Recommended at Discharge Frequent or constant Supervision/Assistance  ?Patient can return home with the following ? Two people to help with walking and/or transfers;A lot of help with bathing/dressing/bathroom;Help with stairs or ramp for entrance ? ?  ?Equipment Recommendations  (TBD)  ?Recommendations for Other Services ?    ?  ?Functional Status Assessment Patient has had a recent decline in their functional status and demonstrates the ability to make significant improvements in function in a  reasonable and predictable amount of time.  ? ?  ?Precautions / Restrictions Precautions ?Precautions: Fall ?Restrictions ?Weight Bearing Restrictions: Yes ?RLE Weight Bearing:  (heel WB with surgical shoe)  ? ?  ? ?Mobility ? Bed Mobility ?Overal bed mobility: Needs Assistance ?Bed Mobility: Supine to Sit, Sit to Supine ?  ?  ?Supine to sit: Min assist ?  ?  ?General bed mobility comments: safe technique with assist for moving B LEs off bed. Once seated, poor initial seated balance with multiple LOB backwards, unable to self correct without max assist. ?  ? ?Transfers ?Overall transfer level: Needs assistance ?Equipment used: Rolling walker (2 wheels) ?Transfers: Sit to/from Stand ?Sit to Stand: Mod assist ?  ?  ?  ?  ?  ?General transfer comment: needs cues for hand placement. Once standing, keeps B knees flexed and unable to tolerate prolonged standing. 3 attempts performed. ?  ? ?Ambulation/Gait ?Ambulation/Gait assistance: Min assist, +2 physical assistance ?Gait Distance (Feet): 2 Feet ?Assistive device: Rolling walker (2 wheels) ?Gait Pattern/deviations: Step-to pattern ?  ?  ?  ?General Gait Details: able to take side steps up towards [facility] with +2 assist and RW. Fatigues quickly, however follows commands well ? ?Stairs ?  ?  ?  ?  ?  ? ?Wheelchair Mobility ?  ? ?Modified Rankin (Stroke Patients Only) ?  ? ?  ? ?Balance Overall balance assessment: Needs assistance ?Sitting-balance support: Single extremity supported, Feet unsupported ?Sitting balance-Leahy Scale: Poor ?  ?  ?Standing balance support: Bilateral upper extremity supported, During functional activity ?Standing balance-Leahy Scale: Poor ?  ?  ?  ?  ?  ?  ?  ?  ?  ?  ?  ?  ?   ? ? ? ?  Pertinent Vitals/Pain Pain Assessment ?Pain Assessment: No/denies pain  ? ? ?Home Living Family/patient expects to be discharged to:: Private residence ?Living Arrangements: Other relatives (lives with brother) ?Available Help at Discharge: Family ?Type of Home:  Apartment ?Home Access: Stairs to enter ?Entrance Stairs-Rails: Right;Left;Can reach both ?Entrance Stairs-Number of Steps: 1 flight ?  ?Home Layout: One level ?Home Equipment: Conservation officer, nature (2 wheels) ?Additional Comments: no family present to verify  ?  ?Prior Function Prior Level of Function : Needs assist ?  ?  ?  ?  ?  ?  ?Mobility Comments: RW for mobility ?  ?  ? ? ?Hand Dominance  ?   ? ?  ?Extremity/Trunk Assessment  ? Upper Extremity Assessment ?Upper Extremity Assessment: Generalized weakness (B UE grossly 3+/5) ?  ? ?Lower Extremity Assessment ?Lower Extremity Assessment: Generalized weakness (B LE grossly 3/5) ?  ? ?   ?Communication  ? Communication: Expressive difficulties  ?Cognition Arousal/Alertness: Awake/alert ?Behavior During Therapy: [facility] for tasks assessed/performed ?Overall Cognitive Status: Within Functional Limits for tasks assessed ?  ?  ?  ?  ?  ?  ?  ?  ?  ?  ?  ?  ?  ?  ?  ?  ?  ?  ?  ? ?  ?General Comments   ? ?  ?Exercises Other Exercises ?Other Exercises: Seated balance activities performed including reaching outside of BOS and pelvic tilts.  ? ?Assessment/Plan  ?  ?PT Assessment Patient needs continued PT services  ?PT Problem List Decreased strength;Decreased mobility;Decreased safety awareness;Decreased activity tolerance;Decreased balance;Decreased knowledge of use of DME ? ?   ?  ?PT Treatment Interventions DME instruction;Therapeutic activities;Gait training;Therapeutic exercise;Cognitive remediation;Patient/family education;Stair training;Balance training;Functional mobility training   ? ?PT Goals (Current goals can be found in the Care Plan section)  ?Acute Rehab PT Goals ?Patient Stated Goal: to get better ?PT Goal Formulation: With patient ?Time For Goal Achievement: 05/19/21 ?Potential to Achieve Goals: Fair ? ?  ?Frequency Min 2X/week ?  ? ? ?Co-evaluation   ?  ?  ?  ?  ? ? ?  ?AM-PAC PT "6 Clicks" Mobility  ?Outcome Measure Help needed turning from your back to your side  while in a flat bed without using bedrails?: A Little ?Help needed moving from lying on your back to sitting on the side of a flat bed without using bedrails?: A Little ?Help needed moving to and from a bed to a chair (including a wheelchair)?: A Lot ?Help needed standing up from a chair using your arms (e.g., wheelchair or bedside chair)?: A Lot ?Help needed to walk in hospital room?: A Lot ?Help needed climbing 3-5 steps with a railing? : Total ?6 Click Score: 13 ? ?  ?End of Session Equipment Utilized During Treatment: Gait belt ?Activity Tolerance: Patient tolerated treatment well ?Patient left: in bed (left with OT) ?Nurse Communication: Mobility status ?PT Visit Diagnosis: Unsteadiness on feet (R26.81);Muscle weakness (generalized) (M62.81);Difficulty in walking, not elsewhere classified (R26.2) ?  ? ?Time: 1324-4010 ?PT Time Calculation (min) (ACUTE ONLY): 19 min ? ? ?Charges:   PT Evaluation ?$PT Re-evaluation: 1 Re-eval ?PT Treatments ?$Therapeutic Activity: 8-22 mins ?  ?   ? ? ?Greggory Stallion, PT, DPT, GCS ?[phone] ? ? ?Layanna Charo ?05/05/2021, 4:12 PM ? ?

## 2021-05-05 NOTE — Progress Notes (Signed)
[place] at [facility] ? ? ?PATIENT NAME: [name]   ? ?MR#:  [id number] ? ?DATE OF BIRTH:  August 21, 1960 ? ?SUBJECTIVE:  ? ?Pt resting quietly. No other issues. His postop day one right first ray amputation. No fever. ? ?VITALS:  ?Blood pressure 128/79, pulse 84, temperature 99.6 ?F (37.6 ?C), resp. rate 16, height 5\' 7"  (1.702 m), weight 68.9 kg, SpO2 98 %. ? ?PHYSICAL EXAMINATION:  ? ?GENERAL:  61 y.o.-year-old patient lying in the bed with no acute distress. Appears chronically ill ?LUNGS: Normal breath sounds bilaterally, no wheezing, rales, rhonchi.  ?CARDIOVASCULAR: S1, S2 normal. No murmurs, rubs, or gallops.  ?ABDOMEN: Soft, nontender, nondistended. Bowel sounds present.  ?EXTREMITIES: lower extremity right foot dressing present. ?NEUROLOGIC: nonfocal  patient is alert and awake ? ?LABORATORY PANEL:  ?CBC ?Recent Labs  ?Lab 04/29/21 ?5956  ?WBC 5.2  ?HGB 14.2  ?HCT 42.3  ?PLT 223  ? ? ? ?Chemistries  ?Recent Labs  ?Lab 05/04/21 ?0314  ?NA 130*  ?K 4.1  ?CL 97*  ?CO2 27  ?GLUCOSE 158*  ?BUN 15  ?CREATININE 0.69  ?CALCIUM 8.7*  ?MG 2.0  ? ? ?Cardiac Enzymes ?No results for input(s): TROPONINI in the last 168 hours. ?RADIOLOGY:  ?DG Foot 2 Views Right ? ?Result Date: 05/04/2021 ?CLINICAL DATA:  Postoperative. EXAM: RIGHT FOOT - 2 VIEW COMPARISON:  Right foot radiographs 04/26/2021 FINDINGS: Interval postsurgical changes of progressive amputation of the first ray to the mid metatarsal shaft. Unchanged amputation of second ray to the distal aspect of the metatarsal head. The new first metatarsal amputation site appears sharp and well-defined. Mild chronic spurring at the Achilles insertion on the calcaneus. Vascular calcifications are noted. No acute fracture or dislocation. IMPRESSION: Interval repeat amputation of the first ray, now to the mid metatarsal shaft. No suspicious cortical erosion. Electronically Signed   By: Yvonne Kendall M.D.   On: 05/04/2021 18:20   ? ?Assessment  and Plan ? ?[name] is a 61 y.o. male with medical history significant of hypertension, hyperlipidemia, diabetes mellitus, CVA 2020, osteomyelitis s/p  right 2nd s/p great toe amputation who presented to the ED via EMS with complaints of worsening generalized weakness for 2-3 days. EMS reported found patient laying in urine soaked clothing.  Patient reported no acute complaints other than profound fatigue ? ?CT head showed new subacute to chronic right cerebellar infarct and chronic small vessel ischemic disease and generalized atrophy.   ?  ?Subsequent CTA chest revealed bilateral pulmonary emboli with no evidence of right heart strain.  Started on heparin infusion and has since been transitioned to Eliquis (started 4/3 PM).  Lower extremity doppler U/S negative for DVT's. ?  ?Assessment and Plan: ?Acute pulmonary embolism ([place]) ?--Patient presented with profound fatigue and generalized weakness, failure to thrive x3 days.  There was concern for acute stroke but MRI was negative and patient did not have any new focal neurologic deficits.  ?CTA chest showed pulmonary emboli in the distal right pulmonary artery, proximal right upper middle and lower lobe pulmonary artery branches.  No evidence of right heart strain. ?--Lower extremity Doppler ultrasound negative for DVTs bilaterally. ?--Was on heparin drip now Transitioned to Eliquis. ?-- Continue Eliquis 10 mg PO BID x 7 days, then 5 mg PO BID--pharmacy to manage ?  ?Foot osteomyelitis, right ([place]) ?--History of prior right great toe amputation due to osteomyelitis in the setting of PAD.  Patient  Has open wound at the site, foul odor  and localized swelling.  MRI shows osteomyelitis of the first metatarsal head. ?4/7: underwent angiography with Dr. Lucky Cowboy. Further revascularization is needed given findings of "..occlusion of the distal popliteal artery, occlusion of the tibioperoneal trunk, and occlusion of all 3 tibial vessels proximally with the posterior  tibial artery being the only runoff to the foot distally after reconstitution in the proximal segment." ?4/10: underwent RLE angiogram with angioplasty to R posterior tibial artery, tibioperoneal trunk, and popliteal artery.  Stent placed in popliteal artery. ?4/11: pt scheduled for IND with debridement  right foot per Dr Luana Shu with possible likely 1st ray amputation ? 4/12: s/p partial right first ray amputation ?--Consults: Podiatry, Vascular surgery, Infectious disease ?--Weightbearing as tolerated on the right foot in surgical shoe ?  ?Hyponatremia ?-- Consider diuresis, but does not appear clinically hypervolemic ?-- Daily weights and strict I/O's ?-- sodium 130 today ?  ?Chronic diastolic CHF (congestive heart failure) ([place]) ?--Appears euvolemic to slightly dry. ?--Echo 04/26/2021 with EF 60 to 65%, mild LVH, grade 1 diastolic dysfunction, mild AR. ?-- Monitor volume with strict I/O's and daily weights ? ?Acute urinary retention ?Notified by RN of pt difficulty voiding (4/8 afternoon).  Pt reports some difficulty with voiding prior to admission also. ?-- Started on Flomax. ?Patient required multiple in and out catheterizations. ?-- Foley placed afternoon of 4/9 ?-- Urology follow up outpatient ?  ?Peripheral artery disease ([place]) ?--With history of right great toe amputation secondary to osteomyelitis as complication of PAD.  Now with right foot osteomyelitis, undergoing vascular evaluation and attempted revascularization. ?--Continue Eliquis, Lipitor ?  ?Generalized weakness ?PT and OT recommend SNF for rehab.  Patient's sister requested consideration for CIR, referral was placed by TOC.  If not accepted by CIR, prefer home with home health, declined SNF placement. ?--Continue PT and OT. ?-- Out of bed to chair ?-- Mobilize frequently as tolerated ?-- Fall precautions ?  ?Open wound of right foot ?History of R great toe amputation due to osteomyelitis.  Hx of PAD.   ?Right foot xray without signs of osteo  (4/4). ?Wound noted to be open and malodorous, no purulent drainage seen on my exam, serosanguinous drainage on gauze dressing.  MRI confirmed osteomyelitis. ?--WOC consulted ?-- Continue wound care per instructions ?--IV unasyn started by ID (4/11) ?   ?Seizure disorder ([place]) ?Continue Keppra ?  ?HTN (hypertension) ?--cont amlopdipine 10 mg qd ?-- As needed oral hydralazine for SBP >160 ?  ?Stroke [facility]) ?Hx of CVA 2020.   ?Head CT on admission with subacute R cerebellar infarct, but MRI negative for acute CVA.   ?--Neurology consulted ?--Continue Eliquis, Lipitor ?--Stop ASA while on Eliquis, resume when off anticoagulation ?--PT/OT/SLP recommend SNF, family requesting CIR - referral placed for evaluation ?  ?Diabetes mellitus type II, non insulin dependent ([place]) ?Hold metformin. ?CBGs are controlled without insulin coverage. ?Monitor CBGs and add sliding scale NovoLog if consistently above 180. ?  ?  ?  ? ? ? ?Procedures: as above ?Family communication :none today ?Consults :vascular, podiatry, infectious disease ?CODE STATUS: full ?DVT Prophylaxis : ?Level of care: Med-Surg ?Status is: Inpatient ?Remains inpatient appropriate because: ongoing workup for right foot osteomyelitis ?  ? ?TOTAL TIME TAKING CARE OF THIS PATIENT: 25 minutes.  ?>50% time spent on counselling and coordination of care ? ?Note: This dictation was prepared with Dragon dictation along with smaller phrase technology. Any transcriptional errors that result from this process are unintentional. ? ?Fritzi Mandes M.D  ? ? ?[facility]  ? ?  CC: ?Primary care physician; Glendon Axe, MD  ?

## 2021-05-05 NOTE — Progress Notes (Signed)
Occupational Therapy Re-E ? ?Patient Details ?Name: [name] ?MRN: [id number] ?DOB: 1960-10-20 ?Today's Date: 05/05/2021 ? ? ?History of present illness Patient is a 61 year old male with a PMH (+) for diabetes, prior CVA with weakness presents to the emergency department for generalized weakness. Small PE found on chest CT on 04/24/21. Pt diagnosed wtih right foot wound/bone infection and is s/p RLE vascular procedures 4/7 and 05/02/21.  Revascularization surgery and partial first ray revision amputation performed on 05/04/21. ?  ?OT comments ? Pt seen for OT re-evaluation after procedures now resulting in new weight bearing restrictions and post-op shoe. Pt demonstrates significant fatigue with limited exertion, poor static sitting and standing balance requiring assist, and increased assist to complete LB ADL and ADL mobility (+2 for lateral steps EOB). Pt continues to benefit from skilled OT services. Continue to recommend SNF at discharge for additional therapy.    ? ?Recommendations for follow up therapy are one component of a multi-disciplinary discharge planning process, led by the attending physician.  Recommendations may be updated based on patient status, additional functional criteria and insurance authorization. ?   ?Follow Up Recommendations ? Skilled nursing-short term rehab (<3 hours/day)  ?  ?Assistance Recommended at Discharge Frequent or constant Supervision/Assistance  ?Patient can return home with the following ? A lot of help with bathing/dressing/bathroom;Direct supervision/assist for medications management;Assistance with cooking/housework;Assist for transportation;Help with stairs or ramp for entrance;Direct supervision/assist for financial management;Two people to help with walking and/or transfers ?  ?Equipment Recommendations ? BSC/3in1;Other (comment) (2WW)  ?  ?Recommendations for Other Services   ? ?  ?Precautions / Restrictions Precautions ?Precautions: Fall ?Restrictions ?Weight  Bearing Restrictions: Yes ?RLE Weight Bearing:  (heel WB with surgical shoe) ?Other Position/Activity Restrictions: heel WB with surgical shoe  ? ? ?  ? ?Mobility Bed Mobility ?Overal bed mobility: Needs Assistance ?Bed Mobility: Sit to Supine ?  ?  ?  ?Sit to supine: Min assist ?  ?  ?  ? ?Transfers ?  ?  ?  ?  ?  ?  ?  ?  ?  ?General transfer comment: Pt required MIN A +2 to take a few lateral steps EOB (OT assisting PT prior ot start of OT session) ?  ?  ?Balance Overall balance assessment: Needs assistance ?Sitting-balance support: Single extremity supported, Feet unsupported ?Sitting balance-Leahy Scale: Poor ?Sitting balance - Comments: requiring PRN SBA to MOD A for sitting balance ?Postural control: Right lateral lean ?Standing balance support: Bilateral upper extremity supported, During functional activity ?Standing balance-Leahy Scale: Poor ?  ?  ?  ?  ?  ?  ?  ?  ?  ?  ?  ?  ?   ? ?ADL either performed or assessed with clinical judgement  ? ?ADL Overall ADL's : Needs assistance/impaired ?  ?  ?  ?  ?  ?  ?  ?  ?Upper Body Dressing : Sitting;Minimal assistance ?  ?Lower Body Dressing: Sitting/lateral leans;Maximal assistance ?Lower Body Dressing Details (indicate cue type and reason): pt able to untie L shoe but unable to doff either shoe or post-op shoe requiring MAX A ?  ?  ?  ?  ?  ?  ?  ?  ?  ? ?Extremity/Trunk Assessment Upper Extremity Assessment ?Upper Extremity Assessment: Generalized weakness (B UE grossly 3+/5) ?  ?Lower Extremity Assessment ?Lower Extremity Assessment: Generalized weakness (B LE grossly 3/5) ?  ?  ?  ? ?Vision   ?  ?  ?Perception   ?  ?  Praxis   ?  ? ?Cognition Arousal/Alertness: Awake/alert ?Behavior During Therapy: [facility] for tasks assessed/performed ?Overall Cognitive Status: No family/caregiver present to determine baseline cognitive functioning ?  ?  ?  ?  ?  ?  ?  ?  ?  ?  ?  ?  ?  ?  ?  ?  ?General Comments: pulled out another IV before OT's arrival and unable to clarify why  he did it to the RN, follows commands with cues, decreased insight into deficits ?  ?  ?   ?Exercises   ? ?  ?Shoulder Instructions   ? ? ?  ?General Comments    ? ? ?Pertinent Vitals/ Pain       Pain Assessment ?Pain Assessment: No/denies pain ? ?Home Living Family/patient expects to be discharged to:: Private residence ?Living Arrangements: Other relatives (lives with brother) ?Available Help at Discharge: Family ?Type of Home: Apartment ?Home Access: Stairs to enter ?Entrance Stairs-Number of Steps: 1 flight ?Entrance Stairs-Rails: Right;Left;Can reach both ?Home Layout: One level ?  ?  ?Bathroom Shower/Tub: Tub/shower unit ?  ?Bathroom Toilet: Standard ?  ?  ?Home Equipment: Conservation officer, nature (2 wheels) ?  ?Additional Comments: no family present to verify ?  ? ?  ?Prior Functioning/Environment    ?  ?  ?  ?   ? ?Frequency ? Min 2X/week  ? ? ? ? ?  ?Progress Toward Goals ? ?OT Goals(current goals can now be found in the care plan section) ? Progress towards OT goals: OT to reassess next treatment ? ?Acute Rehab OT Goals ?Patient Stated Goal: go home with brother ?OT Goal Formulation: With patient ?Time For Goal Achievement: 05/19/21 ?Potential to Achieve Goals: Good  ?Plan Discharge plan remains appropriate;Frequency remains appropriate   ? ?Co-evaluation ? ? ?   ?  ?  ?  ?  ? ?  ?AM-PAC OT "6 Clicks" Daily Activity     ?Outcome Measure ? ? Help from another person eating meals?: None ?Help from another person taking care of personal grooming?: A Little ?Help from another person toileting, which includes using toliet, bedpan, or urinal?: A Lot ?Help from another person bathing (including washing, rinsing, drying)?: A Lot ?Help from another person to put on and taking off regular upper body clothing?: A Little ?Help from another person to put on and taking off regular lower body clothing?: A Lot ?6 Click Score: 16 ? ?  ?End of Session Equipment Utilized During Treatment: Rolling walker (2 wheels);Gait belt ? ?OT  Visit Diagnosis: Other abnormalities of gait and mobility (R26.89);Muscle weakness (generalized) (M62.81) ?  ?Activity Tolerance Patient limited by fatigue ?  ?Patient Left in bed;with call bell/phone within reach;with bed alarm set ?  ?Nurse Communication Mobility status ?  ? ?   ? ?Time: 1657-9038 ?OT Time Calculation (min): 10 min ? ?Charges: OT General Charges ?$OT Visit: 1 Visit ?OT Evaluation ?$OT Re-eval: 1 Re-eval ? ?Ardeth Perfect., MPH, MS, OTR/L ?ascom [phone] ?05/05/21, 4:52 PM ? ?

## 2021-05-05 NOTE — Progress Notes (Addendum)
1 Day Post-Op  ? ?Subjective/Chief Complaint: ?Patient seen.  No complaints. ? ? ?Objective: ?Vital signs in last 24 hours: ?Temp:  [97.9 ?F (36.6 ?C)-100 ?F (37.8 ?C)] 99.6 ?F (37.6 ?C) (04/13 1125) ?Pulse Rate:  [71-84] 84 (04/13 1125) ?Resp:  [16-20] 16 (04/13 1125) ?BP: (107-150)/(72-82) 128/79 (04/13 1125) ?SpO2:  [98 %-100 %] 98 % (04/13 1125) ?Weight:  [68.9 kg-69.4 kg] 68.9 kg (04/13 0528) ?Last BM Date : 04/30/21 ? ?Intake/Output from previous day: ?04/12 0701 - 04/13 0700 ?In: 1410.6 [P.O.:360; I.V.:500; IV Piggyback:550.6] ?Out: 9242 [Urine:1100; Blood:5] ?Intake/Output this shift: ?Total I/O ?In: -  ?Out: 325 [Urine:325] ? ?Bandage on the right foot is dry and intact.  Upon removal very minimal bleeding on the bandaging noted.  Incision appears to be well coapted and stable. ? ? ?Lab Results:  ?No results for input(s): WBC, HGB, HCT, PLT in the last 72 hours. ?BMET ?Recent Labs  ?  05/03/21 ?0436 05/04/21 ?0314  ?NA 129* 130*  ?K 4.4 4.1  ?CL 97* 97*  ?CO2 27 27  ?GLUCOSE 193* 158*  ?BUN 21* 15  ?CREATININE 0.85 0.69  ?CALCIUM 8.7* 8.7*  ? ?PT/INR ?No results for input(s): LABPROT, INR in the last 72 hours. ?ABG ?No results for input(s): PHART, HCO3 in the last 72 hours. ? ?Invalid input(s): PCO2, PO2 ? ?Studies/Results: ?DG Foot 2 Views Right ? ?Result Date: 05/04/2021 ?CLINICAL DATA:  Postoperative. EXAM: RIGHT FOOT - 2 VIEW COMPARISON:  Right foot radiographs 04/26/2021 FINDINGS: Interval postsurgical changes of progressive amputation of the first ray to the mid metatarsal shaft. Unchanged amputation of second ray to the distal aspect of the metatarsal head. The new first metatarsal amputation site appears sharp and well-defined. Mild chronic spurring at the Achilles insertion on the calcaneus. Vascular calcifications are noted. No acute fracture or dislocation. IMPRESSION: Interval repeat amputation of the first ray, now to the mid metatarsal shaft. No suspicious cortical erosion. Electronically  Signed   By: Yvonne Kendall M.D.   On: 05/04/2021 18:20   ? ?Anti-infectives: ?Anti-infectives (From admission, onward)  ? ? Start     Dose/Rate Route Frequency Ordered Stop  ? 05/04/21 1613  ceFAZolin (ANCEF) 2-4 GM/100ML-% IVPB       ?Note to Pharmacy: Jeanene Erb E: cabinet override  ?    05/04/21 1613 05/04/21 1701  ? 05/04/21 0600  ceFAZolin (ANCEF) IVPB 2g/100 mL premix       ? 2 g ?200 mL/hr over 30 Minutes Intravenous On call to O.R. 05/03/21 2336 05/04/21 1722  ? 05/03/21 1600  Ampicillin-Sulbactam (UNASYN) 3 g in sodium chloride 0.9 % 100 mL IVPB       ? 3 g ?200 mL/hr over 30 Minutes Intravenous Every 6 hours 05/03/21 1442    ? 05/02/21 1445  ceFAZolin (ANCEF) IVPB 2g/100 mL premix       ? 2 g ?200 mL/hr over 30 Minutes Intravenous  Once 05/02/21 1357 05/02/21 1655  ? 04/29/21 1015  ceFAZolin (ANCEF) IVPB 2g/100 mL premix       ? 2 g ?200 mL/hr over 30 Minutes Intravenous  Once 04/29/21 0929 04/29/21 1057  ? ?  ? ? ?Assessment/Plan: ?s/p Procedure(s): ?AMPUTATION RAY-Partial (Right) ?Assessment: Stable status post I&D with debridement bone right foot. ? ?Plan: Betadine gauze applied to the incision followed by a bulky gauze bandage.  Patient instructed to keep this clean, dry, and do not remove.  Patient is stable for discharge from podiatry standpoint.  If still in the hospital  for medical reasons over the weekend may want to change the dressing 1 more time prior to discharge.  Plan for follow-up in approximately 1 week for evaluation with Dr. Luana Shu at [place] [facility]. ? LOS: 12 days  ? ? ?[name] ?05/05/2021 ? ?

## 2021-05-05 NOTE — Progress Notes (Signed)
? ?Date of Admission:  04/23/2021    ? ?ID: [name] is a 61 y.o. male  ?Principal Problem: ?  Acute pulmonary embolism ([place]) ?Active Problems: ?  Diabetes mellitus type II, non insulin dependent ([place]) ?  Stroke [facility]) ?  HTN (hypertension) ?  Seizure disorder ([place]) ?  CVA (cerebral vascular accident) [facility]) ?  Elevated troponin ?  Open wound of right foot ?  Generalized weakness ?  Foot osteomyelitis, right ([place]) ?  Peripheral artery disease ([place]) ?  Acute urinary retention ?  Chronic diastolic CHF (congestive heart failure) ([place]) ?  Hyponatremia ? ? ? ?Subjective: ?Feeling okay ?Had rt ray excision yesterday ? ?Medications:  ?  stroke: early stages of recovery book   Does not apply Once  ? amLODipine  10 mg Oral Daily  ? apixaban  5 mg Oral BID  ? atorvastatin  20 mg Oral q1800  ? Chlorhexidine Gluconate Cloth  6 each Topical Daily  ? dextromethorphan-guaiFENesin  1 tablet Oral BID  ? levETIRAcetam  500 mg Oral BID  ? tamsulosin  0.4 mg Oral QPC supper  ? ? ?Objective: ?Vital signs in last 24 hours: ?Temp:  [97.9 ?F (36.6 ?C)-100 ?F (37.8 ?C)] 99.6 ?F (37.6 ?C) (04/13 1125) ?Pulse Rate:  [71-84] 84 (04/13 1125) ?Resp:  [16-20] 16 (04/13 1125) ?BP: (107-150)/(72-82) 128/79 (04/13 1125) ?SpO2:  [98 %-100 %] 98 % (04/13 1125) ?Weight:  [68.9 kg-69.4 kg] 68.9 kg (04/13 0528) ? ?PHYSICAL EXAM:  ?General: Alert, cooperative, no distress,  ?Lungs: Clear to auscultation bilaterally. No Wheezing or Rhonchi. No rales. ?Heart: Regular rate and rhythm, no murmur, rub or gallop. ?Abdomen: Soft, non-tender,not distended. Bowel sounds normal. No masses ?Extremities:  ?Post surgery ? ?Pre surgery ? ? ?Skin: No rashes or lesions. Or bruising ?Lymph: Cervical, supraclavicular normal. ?Neurologic: not examined in detail ? ?Lab Results ?Recent Labs  ?  05/03/21 ?0436 05/04/21 ?0314  ?NA 129* 130*  ?K 4.4 4.1  ?CL 97* 97*  ?CO2 27 27  ?BUN 21* 15  ?CREATININE 0.85 0.69  ? ?Liver Panel ?No results for input(s): PROT, ALBUMIN, AST,  ALT, ALKPHOS, BILITOT, BILIDIR, IBILI in the last 72 hours. ?Sedimentation Rate ?No results for input(s): ESRSEDRATE in the last 72 hours. ?C-Reactive Protein ?No results for input(s): CRP in the last 72 hours. ? ?Microbiology: ?Wound anerobes  ?Studies/Results: ?DG Foot 2 Views Right ? ?Result Date: 05/04/2021 ?CLINICAL DATA:  Postoperative. EXAM: RIGHT FOOT - 2 VIEW COMPARISON:  Right foot radiographs 04/26/2021 FINDINGS: Interval postsurgical changes of progressive amputation of the first ray to the mid metatarsal shaft. Unchanged amputation of second ray to the distal aspect of the metatarsal head. The new first metatarsal amputation site appears sharp and well-defined. Mild chronic spurring at the Achilles insertion on the calcaneus. Vascular calcifications are noted. No acute fracture or dislocation. IMPRESSION: Interval repeat amputation of the first ray, now to the mid metatarsal shaft. No suspicious cortical erosion. Electronically Signed   By: Yvonne Kendall M.D.   On: 05/04/2021 18:20   ? ? ?Assessment/Plan: ? ?61 year old male presenting with weakness, poor appetite, altered mental status ?Found to have PE ?Also was dehydrated. ?? ?PE on apixaban. ? ?Generalized weakness with hyponatremia.  Serum cortisol was checked it was 14.7 ? ?Diabetes mellitus ? ?Indolent ulcer on the right toe amputation site.  No obvious evidence of infection clinically. ?MRI shows osteomyelitis - chronic osteomyelitis.  Culture anerobes- started unasyn  ?Underwent Right partial first ray amputation with excision of wound  and closure on 05/04/21 ?Looks like a therapeutic amputaion- may be able to stop IV completely or switch to PO after pathology results ?  ?PAD- angio  showed occlusion of the tibioperoneal trunk, and occlusion of all 3 tibial vessels proximally with the posterior tibial artery  ?Had balloon angioplasty of rt PTA and RT tibioperoneal trunk. Stent placement rt popliteal artery ? ?Discussed the management with  patient ?  ?

## 2021-05-06 ENCOUNTER — Encounter (HOSPITAL_COMMUNITY): Payer: Self-pay | Admitting: Physical Medicine & Rehabilitation

## 2021-05-06 ENCOUNTER — Inpatient Hospital Stay (HOSPITAL_COMMUNITY)
Admission: RE | Admit: 2021-05-06 | Discharge: 2021-06-03 | DRG: 945 | Disposition: A | Discharge status: Skilled Nursing Facility | Payer: Medicare Other | Source: Other Acute Inpatient Hospital | Attending: Physical Medicine & Rehabilitation | Admitting: Physical Medicine & Rehabilitation

## 2021-05-06 ENCOUNTER — Other Ambulatory Visit: Payer: Self-pay

## 2021-05-06 DIAGNOSIS — I110 Hypertensive heart disease with heart failure: Secondary | ICD-10-CM | POA: Diagnosis present

## 2021-05-06 DIAGNOSIS — R339 Retention of urine, unspecified: Secondary | ICD-10-CM | POA: Diagnosis present

## 2021-05-06 DIAGNOSIS — G4700 Insomnia, unspecified: Secondary | ICD-10-CM | POA: Diagnosis not present

## 2021-05-06 DIAGNOSIS — F17210 Nicotine dependence, cigarettes, uncomplicated: Secondary | ICD-10-CM | POA: Diagnosis present

## 2021-05-06 DIAGNOSIS — R509 Fever, unspecified: Secondary | ICD-10-CM | POA: Diagnosis not present

## 2021-05-06 DIAGNOSIS — L97519 Non-pressure chronic ulcer of other part of right foot with unspecified severity: Secondary | ICD-10-CM | POA: Diagnosis not present

## 2021-05-06 DIAGNOSIS — Z89421 Acquired absence of other right toe(s): Secondary | ICD-10-CM

## 2021-05-06 DIAGNOSIS — I2699 Other pulmonary embolism without acute cor pulmonale: Secondary | ICD-10-CM | POA: Diagnosis not present

## 2021-05-06 DIAGNOSIS — Z86711 Personal history of pulmonary embolism: Secondary | ICD-10-CM

## 2021-05-06 DIAGNOSIS — Z7984 Long term (current) use of oral hypoglycemic drugs: Secondary | ICD-10-CM | POA: Diagnosis not present

## 2021-05-06 DIAGNOSIS — E1142 Type 2 diabetes mellitus with diabetic polyneuropathy: Secondary | ICD-10-CM | POA: Diagnosis present

## 2021-05-06 DIAGNOSIS — K5900 Constipation, unspecified: Secondary | ICD-10-CM | POA: Diagnosis present

## 2021-05-06 DIAGNOSIS — I10 Essential (primary) hypertension: Secondary | ICD-10-CM | POA: Diagnosis not present

## 2021-05-06 DIAGNOSIS — Z91148 Patient's other noncompliance with medication regimen for other reason: Secondary | ICD-10-CM | POA: Diagnosis not present

## 2021-05-06 DIAGNOSIS — I7092 Chronic total occlusion of artery of the extremities: Secondary | ICD-10-CM | POA: Diagnosis not present

## 2021-05-06 DIAGNOSIS — I214 Non-ST elevation (NSTEMI) myocardial infarction: Secondary | ICD-10-CM | POA: Diagnosis present

## 2021-05-06 DIAGNOSIS — I69320 Aphasia following cerebral infarction: Secondary | ICD-10-CM | POA: Diagnosis not present

## 2021-05-06 DIAGNOSIS — E871 Hypo-osmolality and hyponatremia: Secondary | ICD-10-CM | POA: Diagnosis present

## 2021-05-06 DIAGNOSIS — Z7901 Long term (current) use of anticoagulants: Secondary | ICD-10-CM | POA: Diagnosis not present

## 2021-05-06 DIAGNOSIS — E1165 Type 2 diabetes mellitus with hyperglycemia: Secondary | ICD-10-CM | POA: Diagnosis present

## 2021-05-06 DIAGNOSIS — G40909 Epilepsy, unspecified, not intractable, without status epilepticus: Secondary | ICD-10-CM | POA: Diagnosis present

## 2021-05-06 DIAGNOSIS — I639 Cerebral infarction, unspecified: Secondary | ICD-10-CM | POA: Diagnosis not present

## 2021-05-06 DIAGNOSIS — E1369 Other specified diabetes mellitus with other specified complication: Secondary | ICD-10-CM | POA: Diagnosis not present

## 2021-05-06 DIAGNOSIS — M868X7 Other osteomyelitis, ankle and foot: Secondary | ICD-10-CM | POA: Diagnosis not present

## 2021-05-06 DIAGNOSIS — E785 Hyperlipidemia, unspecified: Secondary | ICD-10-CM | POA: Diagnosis present

## 2021-05-06 DIAGNOSIS — E1169 Type 2 diabetes mellitus with other specified complication: Secondary | ICD-10-CM | POA: Diagnosis present

## 2021-05-06 DIAGNOSIS — Z79899 Other long term (current) drug therapy: Secondary | ICD-10-CM | POA: Diagnosis not present

## 2021-05-06 DIAGNOSIS — Z89412 Acquired absence of left great toe: Secondary | ICD-10-CM | POA: Diagnosis not present

## 2021-05-06 DIAGNOSIS — Z833 Family history of diabetes mellitus: Secondary | ICD-10-CM

## 2021-05-06 DIAGNOSIS — E1151 Type 2 diabetes mellitus with diabetic peripheral angiopathy without gangrene: Secondary | ICD-10-CM | POA: Diagnosis present

## 2021-05-06 DIAGNOSIS — R338 Other retention of urine: Secondary | ICD-10-CM | POA: Diagnosis not present

## 2021-05-06 DIAGNOSIS — R5381 Other malaise: Principal | ICD-10-CM | POA: Diagnosis present

## 2021-05-06 DIAGNOSIS — I70235 Atherosclerosis of native arteries of right leg with ulceration of other part of foot: Secondary | ICD-10-CM | POA: Diagnosis not present

## 2021-05-06 DIAGNOSIS — E119 Type 2 diabetes mellitus without complications: Secondary | ICD-10-CM | POA: Diagnosis not present

## 2021-05-06 DIAGNOSIS — I5032 Chronic diastolic (congestive) heart failure: Secondary | ICD-10-CM | POA: Diagnosis present

## 2021-05-06 DIAGNOSIS — N390 Urinary tract infection, site not specified: Secondary | ICD-10-CM | POA: Diagnosis not present

## 2021-05-06 DIAGNOSIS — N3000 Acute cystitis without hematuria: Secondary | ICD-10-CM | POA: Diagnosis not present

## 2021-05-06 DIAGNOSIS — M869 Osteomyelitis, unspecified: Secondary | ICD-10-CM

## 2021-05-06 LAB — GLUCOSE, CAPILLARY
Glucose-Capillary: 114 mg/dL — ABNORMAL HIGH (ref 70–99)
Glucose-Capillary: 107 mg/dL — ABNORMAL HIGH (ref 70–99)

## 2021-05-06 LAB — SURGICAL PATHOLOGY

## 2021-05-06 MED ORDER — DIPHENHYDRAMINE HCL 12.5 MG/5ML PO ELIX: 12.5000 mg | ORAL_SOLUTION | Freq: Four times a day (QID) | ORAL | Status: DC | PRN

## 2021-05-06 MED ORDER — ACETAMINOPHEN 325 MG PO TABS
325.0000 mg | ORAL_TABLET | ORAL | Status: DC | PRN
  Administered 2021-05-24 – 2021-05-25 (×2): 650 mg via ORAL
  Filled 2021-05-06 (×2): qty 2

## 2021-05-06 MED ORDER — AMOXICILLIN-POT CLAVULANATE 875-125 MG PO TABS: 1.0000 | ORAL_TABLET | Freq: Two times a day (BID) | ORAL | 0 refills | Status: DC

## 2021-05-06 MED ORDER — FLEET ENEMA 7-19 GM/118ML RE ENEM
1.0000 | ENEMA | Freq: Once | RECTAL | Status: DC | PRN
  Filled 2021-05-06: qty 1

## 2021-05-06 MED ORDER — TAMSULOSIN HCL 0.4 MG PO CAPS
0.4000 mg | ORAL_CAPSULE | Freq: Every day | ORAL | Status: DC
Start: 2021-05-06 — End: 2021-06-03
  Administered 2021-05-06 – 2021-06-02 (×28): 0.4 mg via ORAL
  Filled 2021-05-06 (×28): qty 1

## 2021-05-06 MED ORDER — DM-GUAIFENESIN ER 30-600 MG PO TB12
1.0000 | ORAL_TABLET | Freq: Two times a day (BID) | ORAL | Status: DC
  Administered 2021-05-06 – 2021-05-26 (×40): 1 via ORAL
  Filled 2021-05-06 (×40): qty 1

## 2021-05-06 MED ORDER — AMOXICILLIN-POT CLAVULANATE 875-125 MG PO TABS: 1.0000 | ORAL_TABLET | Freq: Two times a day (BID) | ORAL | Status: DC

## 2021-05-06 MED ORDER — LEVETIRACETAM 250 MG PO TABS
500.0000 mg | ORAL_TABLET | Freq: Two times a day (BID) | ORAL | Status: DC
  Administered 2021-05-06 – 2021-06-03 (×56): 500 mg via ORAL
  Filled 2021-05-06 (×60): qty 2

## 2021-05-06 MED ORDER — METFORMIN HCL 500 MG PO TABS
250.0000 mg | ORAL_TABLET | Freq: Every day | ORAL | Status: DC
  Administered 2021-05-07 – 2021-05-12 (×6): 250 mg via ORAL
  Filled 2021-05-06 (×6): qty 1

## 2021-05-06 MED ORDER — ALUM & MAG HYDROXIDE-SIMETH 200-200-20 MG/5ML PO SUSP: 30.0000 mL | ORAL | Status: DC | PRN

## 2021-05-06 MED ORDER — SORBITOL 70 % SOLN
30.0000 mL | Freq: Every day | Status: DC | PRN
  Administered 2021-05-09 – 2021-05-27 (×3): 30 mL via ORAL
  Filled 2021-05-06 (×3): qty 30

## 2021-05-06 MED ORDER — LIVING WELL WITH DIABETES BOOK
Freq: Once | Status: AC
  Filled 2021-05-06: qty 1

## 2021-05-06 MED ORDER — MICONAZOLE NITRATE 2 % EX CREA
TOPICAL_CREAM | Freq: Two times a day (BID) | CUTANEOUS | Status: DC
  Administered 2021-05-19 – 2021-05-21 (×2): 1 via TOPICAL
  Filled 2021-05-06 (×2): qty 28.4

## 2021-05-06 MED ORDER — SENNA 8.6 MG PO TABS
1.0000 | ORAL_TABLET | Freq: Two times a day (BID) | ORAL | Status: DC
  Administered 2021-05-06 – 2021-06-03 (×57): 8.6 mg via ORAL
  Filled 2021-05-06 (×59): qty 1

## 2021-05-06 MED ORDER — METHOCARBAMOL 500 MG PO TABS
500.0000 mg | ORAL_TABLET | Freq: Four times a day (QID) | ORAL | Status: DC | PRN
  Administered 2021-05-16: 500 mg via ORAL
  Filled 2021-05-06 (×2): qty 1

## 2021-05-06 MED ORDER — GUAIFENESIN 100 MG/5ML PO LIQD: 5.0000 mL | ORAL | 0 refills | Status: DC | PRN

## 2021-05-06 MED ORDER — SORBITOL 70 % SOLN
30.0000 mL | Freq: Once | Status: AC
  Administered 2021-05-06: 30 mL via ORAL
  Filled 2021-05-06: qty 30

## 2021-05-06 MED ORDER — POLYETHYLENE GLYCOL 3350 17 G PO PACK
17.0000 g | PACK | Freq: Every day | ORAL | Status: DC | PRN
  Administered 2021-05-08 – 2021-05-26 (×2): 17 g via ORAL
  Filled 2021-05-06 (×2): qty 1

## 2021-05-06 MED ORDER — ATORVASTATIN CALCIUM 40 MG PO TABS
40.0000 mg | ORAL_TABLET | Freq: Every day | ORAL | Status: DC
  Administered 2021-05-06 – 2021-06-02 (×28): 40 mg via ORAL
  Filled 2021-05-06 (×29): qty 1

## 2021-05-06 MED ORDER — AMLODIPINE BESYLATE 10 MG PO TABS
10.0000 mg | ORAL_TABLET | Freq: Every day | ORAL | Status: DC
  Administered 2021-05-07 – 2021-06-03 (×28): 10 mg via ORAL
  Filled 2021-05-06 (×28): qty 1

## 2021-05-06 MED ORDER — APIXABAN 5 MG PO TABS
5.0000 mg | ORAL_TABLET | Freq: Two times a day (BID) | ORAL | Status: DC
  Administered 2021-05-06 – 2021-06-03 (×56): 5 mg via ORAL
  Filled 2021-05-06 (×58): qty 1

## 2021-05-06 MED ORDER — TAMSULOSIN HCL 0.4 MG PO CAPS: 0.4000 mg | ORAL_CAPSULE | Freq: Every day | ORAL | 0 refills | Status: DC

## 2021-05-06 MED ORDER — TRAMADOL HCL 50 MG PO TABS: 50.0000 mg | ORAL_TABLET | Freq: Four times a day (QID) | ORAL | Status: DC | PRN

## 2021-05-06 MED ORDER — APIXABAN 5 MG PO TABS: 5.0000 mg | ORAL_TABLET | Freq: Two times a day (BID) | ORAL | 2 refills | Status: DC

## 2021-05-06 MED ORDER — PROCHLORPERAZINE MALEATE 5 MG PO TABS
5.0000 mg | ORAL_TABLET | Freq: Four times a day (QID) | ORAL | Status: DC | PRN
  Filled 2021-05-06: qty 2

## 2021-05-06 MED ORDER — ASPIRIN EC 81 MG PO TBEC
81.0000 mg | DELAYED_RELEASE_TABLET | Freq: Every day | ORAL | Status: DC
Start: 2021-05-07 — End: 2021-06-03
  Administered 2021-05-07 – 2021-06-03 (×28): 81 mg via ORAL
  Filled 2021-05-06 (×28): qty 1

## 2021-05-06 MED ORDER — AMOXICILLIN-POT CLAVULANATE 500-125 MG PO TABS
1.0000 | ORAL_TABLET | Freq: Three times a day (TID) | ORAL | Status: AC
  Administered 2021-05-06 – 2021-05-13 (×21): 500 mg via ORAL
  Filled 2021-05-06 (×22): qty 1

## 2021-05-06 MED ORDER — PROCHLORPERAZINE 25 MG RE SUPP: 12.5000 mg | Freq: Four times a day (QID) | RECTAL | Status: DC | PRN

## 2021-05-06 MED ORDER — TRAMADOL HCL 50 MG PO TABS: 50.0000 mg | ORAL_TABLET | Freq: Four times a day (QID) | ORAL | 0 refills | Status: DC | PRN

## 2021-05-06 MED ORDER — TRAMADOL HCL 50 MG PO TABS
50.0000 mg | ORAL_TABLET | Freq: Four times a day (QID) | ORAL | Status: DC | PRN
  Administered 2021-05-13: 50 mg via ORAL
  Filled 2021-05-06 (×2): qty 1

## 2021-05-06 MED ORDER — PROCHLORPERAZINE EDISYLATE 10 MG/2ML IJ SOLN: 5.0000 mg | Freq: Four times a day (QID) | INTRAMUSCULAR | Status: DC | PRN

## 2021-05-06 MED ORDER — ASPIRIN EC 81 MG PO TBEC
81.0000 mg | DELAYED_RELEASE_TABLET | Freq: Every day | ORAL | Status: DC
  Administered 2021-05-06: 81 mg via ORAL
  Filled 2021-05-06: qty 1

## 2021-05-06 MED ORDER — SENNOSIDES-DOCUSATE SODIUM 8.6-50 MG PO TABS: 1.0000 | ORAL_TABLET | Freq: Every evening | ORAL | 0 refills | Status: DC | PRN

## 2021-05-06 MED ORDER — TRAZODONE HCL 50 MG PO TABS: 25.0000 mg | ORAL_TABLET | Freq: Every evening | ORAL | Status: DC | PRN

## 2021-05-06 NOTE — Progress Notes (Signed)
Inpatient Rehab Admissions Coordinator:    I have a CIR bed for this pt. Today. RN may call report to [phone].  Sandar Krinke, MS, CCC-SLP Rehab Admissions Coordinator  [phone] (celll) [phone] (office)  

## 2021-05-06 NOTE — Progress Notes (Addendum)
Signed    ?  ?PMR Admission Coordinator Pre-Admission Assessment ?  ?Patient: [name] is an 61 y.o., male ?MRN: [id number] ?DOB: [birth year] ?Height: 5\' 7"  (170.2 cm) ?Weight: 72.1 kg ?  ?Insurance Information ?HMO:     PPO:      PCP:      IPA:      80/20: yes     OTHER:  ?PRIMARY: Medicare AB      Policy#: [id number]           Subscriber: Pt. ?Phone#: Verified online    Fax#:  ?Pre-Cert#:       Employer:  ?Benefits:  Phone #:      Name:  ?Eff. Date: Parts A ad B effective  02/23/21 Deduct: $1600      Out of Pocket Max:  None      Life Max: N/A  ?CIR: 100%      SNF: 100 days ?Outpatient: 80%     Co-Pay: 20% ?Home Health: 100%      Co-Pay: none ?DME: 80%     Co-Pay: 20% ?Providers: patient's choice ? ?SECONDARY: Medicaid of [place]      Policy#: [id number] N           Phone#:  ?  ?Financial Counselor:       Phone#:  ?  ?The ?Data Collection Information Summary? for patients in Inpatient Rehabilitation Facilities with attached ?Privacy Act [place] Records? was provided and verbally reviewed with: Patient ?  ?Emergency Contact Information ?Contact Information   ?  ?  Name Relation Home Work Mobile  ?  Marley,Clayton Father [phone]      ?  Kesselman,Tony Brother [phone]      ?  deddrick, saindon Sister [phone]      ?  ?   ?  ?  ?Current Medical History  ?Patient Admitting Diagnosis: PE, Debility, Subacute CVA ?History of Present Illness: [name] is a 61 y.o. male with medical history significant of hypertension, hyperlipidemia, diabetes mellitus, CVA 2020, osteomyelitis s/p  right 2nd, great toe amputation who presents to ed BIB EMS with complaints of generalized weakness for the las 2-3 days. In the ED BP was 164/91 with other vitals normal.  ECG without acute ischemic changes.  Labs with mild hypokalemia and hs-troponin mildly elevated.  Acute PE found on CT of the Chest. CT of the head showed subacute cerebellar infarct. Follow up MRI of the brain showed no acute CVA. Pt. Also noted to have  open wound of the right foot. X-ray of the foot showed no sign of osteomyelitis, MRI of that foot to be performed 4/5. Pt. Also noted to have elevated troponin during this admission, felt to be due to demand ischemia in the setting of acute pulmonary emboli. Pt. Also diagnosed wtih right foot wound/bone infection and is s/p RLE vascular procedures 4/7 and 05/02/21. Revascularization surgery and partial first ray revision amputation performed on 05/04/21.Pt. Was seen by PT and OT who recommended post acute rehab to assist return to PLOF.  ?  ?Patient's medical record from [facility] has been reviewed by the rehabilitation admission coordinator and physician. ?  ?Past Medical History  ?    ?Past Medical History:  ?Diagnosis Date  ? Diabetes mellitus without complication ([place])    ? Stroke [facility]) 01/2018  ?  Per Brother   ?  ?  ?Has the patient had major surgery during 100 days prior to admission? No ?  ?Family History   ?family history includes  Diabetes in his brother. ?  ?Current Medications ?  ?Current Facility-Administered Medications:  ?   stroke: mapping our early stages of recovery book, , Does not apply, Once, Clance Boll, MD ?  acetaminophen (TYLENOL) tablet 650 mg, 650 mg, Oral, Q4H PRN **OR** acetaminophen (TYLENOL) 160 MG/5ML solution 650 mg, 650 mg, Per Tube, Q4H PRN **OR** acetaminophen (TYLENOL) suppository 650 mg, 650 mg, Rectal, Q4H PRN, Clance Boll, MD ?  amLODipine (NORVASC) tablet 5 mg, 5 mg, Oral, Daily, [name] A, DO ?  apixaban (ELIQUIS) tablet 10 mg, 10 mg, Oral, BID, 10 mg at 04/26/21 2154 **FOLLOWED BY** [START ON 05/02/2021] apixaban (ELIQUIS) tablet 5 mg, 5 mg, Oral, BID, Val Riles, MD ?  atorvastatin (LIPITOR) tablet 20 mg, 20 mg, Oral, q1800, Val Riles, MD, 20 mg at 04/26/21 1647 ?  hydrALAZINE (APRESOLINE) tablet 25 mg, 25 mg, Oral, Q6H PRN, [name] A, DO ?  levETIRAcetam (KEPPRA) tablet 500 mg, 500 mg, Oral, BID, Myles Rosenthal  A, MD, 500 mg at 04/26/21 2154 ?  senna-docusate (Senokot-S) tablet 1 tablet, 1 tablet, Oral, QHS PRN, Clance Boll, MD ?  ?Patients Current Diet:  ?Diet Order   ?  ?         ?    Diet heart healthy/carb modified Room service appropriate? Yes; Fluid consistency: Thin  Diet effective now       ?  ?  ?   ?  ?  ?   ?  ?  ?Precautions / Restrictions ?Precautions ?Precautions: Fall ?Restrictions ?Weight Bearing Restrictions: No  ?  ?Has the patient had 2 or more falls or a fall with injury in the past year? No ?  ?Prior Activity Level ?Limited Community (1-2x/wk): Pt. went out for appointments mostly ?  ?Prior Functional Level ?Self Care: Did the patient need help bathing, dressing, using the toilet or eating? Independent ?  ?Indoor Mobility: Did the patient need assistance with walking from room to room (with or without device)? Independent ?  ?Stairs: Did the patient need assistance with internal or external stairs (with or without device)? Independent ?  ?Functional Cognition: Did the patient need help planning regular tasks such as shopping or remembering to take medications? Independent ?  ?Patient Information ?Are you of Hispanic, Latino/a,or Spanish origin?: A. No, not of Hispanic, Latino/a, or Spanish origin ?What is your race?: B. Black or African American ?Do you need or want an interpreter to communicate with a doctor or health care staff?: 2. No ?  ?Patient's Response To:  ?Health Literacy and Transportation ?Is the patient able to respond to health literacy and transportation needs?: Yes ?Health Literacy - How often do you need to have someone help you when you read instructions, pamphlets, or other written material from your doctor or pharmacy?: Sometimes ?In the past 12 months, has lack of transportation kept you from medical appointments or from getting medications?: Yes ?In the past 12 months, has lack of transportation kept you from meetings, work, or from getting things needed for daily living?:  Yes ?  ?Home Assistive Devices / Equipment ?Home Assistive Devices/Equipment: None ?Home Equipment: Conservation officer, nature (2 wheels) ?  ?Prior Device Use: Indicate devices/aids used by the patient prior to current illness, exacerbation or injury? None of the above ?  ?Current Functional Level ?Cognition ?  Arousal/Alertness: Lethargic ?Overall Cognitive Status: No family/caregiver present to determine baseline cognitive functioning ?Orientation Level: Oriented X4 ?General Comments: A&Ox3- but follows commands well, decreased insight into deficits ?   ?  Extremity Assessment ?(includes Sensation/Coordination) ?  Upper Extremity Assessment: Generalized weakness  ?Lower Extremity Assessment: Generalized weakness  ?   ?ADLs ?  Overall ADL's : Needs assistance/impaired ?General ADL Comments: Pt required significant time and effort to doff R sock and ultimately unable to donn R sock after. Pt overall requires MOD A for LB ADL, MOD-MAX A for ADL transfers with RW, and MIN A for seated UB ADL tasks.  ?   ?Mobility ?  Overal bed mobility: Needs Assistance ?Bed Mobility: Supine to Sit, Sit to Supine ?Supine to sit: Mod assist, Max assist, HOB elevated ?Sit to supine: Mod assist, Max assist ?General bed mobility comments: Mod A through B HHA to pull up into sitting, patient was able to move BLEs to EOB  ?   ?Transfers ?  Overall transfer level: Needs assistance ?Equipment used: Rolling walker (2 wheels) ?Transfers: Sit to/from Stand ?Sit to Stand: Mod assist, Max assist ?General transfer comment: VC for anterior weight shift to prevent posterior LOB  ?   ?Ambulation / Gait / Stairs / Wheelchair Mobility ?  Ambulation/Gait ?Ambulation/Gait assistance:  (deferred due to safety)  ?   ?Posture / Balance Dynamic Sitting Balance ?Sitting balance - Comments: SBA, no LOB ?Balance ?Overall balance assessment: Needs assistance ?Sitting-balance support: Bilateral upper extremity supported, Feet supported, Single extremity supported ?Sitting  balance-Leahy Scale: Poor ?Sitting balance - Comments: SBA, no LOB ?Postural control: Posterior lean ?Standing balance support: Bilateral upper extremity supported, During functional activity, Reliant on

## 2021-05-06 NOTE — Progress Notes (Signed)
[facility] ? ?Daily Progress Note ? ? ?Subjective  -  ? ?No complaints this am. Pain control adequate. No new events ? ?Objective ?Vitals:  ? 05/05/21 2118 05/05/21 2358 05/06/21 0510 05/06/21 0741  ?BP: 128/70 133/69 (!) 143/79 134/80  ?Pulse: 87 72 73 78  ?Resp: 16 16  16   ?Temp: 98.8 ?F (37.1 ?C) 98.4 ?F (36.9 ?C) 98.2 ?F (36.8 ?C) 98.5 ?F (36.9 ?C)  ?TempSrc:  Oral Oral   ?SpO2: 99% 100% 100% 100%  ?Weight:   69.1 kg   ?Height:      ? ? ?Intake/Output Summary (Last 24 hours) at 05/06/2021 0942 ?Last data filed at 05/06/2021 0454 ?Gross per 24 hour  ?Intake 360 ml  ?Output 1525 ml  ?Net -1165 ml  ? ? ?PULM  CTAB ?CV  RRR ?VASC  Foot wound dressed so pulse not easily palpable.  ? ?Laboratory ?CBC ?   ?Component Value Date/Time  ? WBC 5.2 04/29/2021 0514  ? HGB 14.2 04/29/2021 0514  ? HGB 14.8 12/29/2013 1214  ? HCT 42.3 04/29/2021 0514  ? HCT 45.2 12/29/2013 1214  ? PLT 223 04/29/2021 0514  ? PLT 143 (L) 12/29/2013 1214  ? ? ?BMET ?   ?Component Value Date/Time  ? NA 130 (L) 05/04/2021 0314  ? NA 135 (L) 12/29/2013 1214  ? K 4.1 05/04/2021 0314  ? K 3.8 12/29/2013 1214  ? CL 97 (L) 05/04/2021 0314  ? CL 99 12/29/2013 1214  ? CO2 27 05/04/2021 0314  ? CO2 28 12/29/2013 1214  ? GLUCOSE 158 (H) 05/04/2021 0314  ? GLUCOSE 234 (H) 12/29/2013 1214  ? BUN 15 05/04/2021 0314  ? BUN 8 12/29/2013 1214  ? CREATININE 0.69 05/04/2021 0314  ? CREATININE 0.86 12/29/2013 1214  ? CALCIUM 8.7 (L) 05/04/2021 0314  ? CALCIUM 8.3 (L) 12/29/2013 1214  ? GFRNONAA >60 05/04/2021 0314  ? GFRNONAA >60 12/29/2013 1214  ? GFRAA >60 07/12/2019 1311  ? GFRAA >60 12/29/2013 1214  ? ? ?Assessment/Planning: ?POD #4 s/p right leg revascularization ? ?Doing well ?Podiatry has done surgery and following wound ?On Aspirin and Eliquis and a statin. Wound discharge home on that regimen. Plavix not indicated with Eliquis on board due to increased risk of bleeding.   ?Will sign off, please call with questions ?F/u in our office in 3  weeks with ABIs ? ?[name] ? ?05/06/2021, 9:42 AM ? ? ? ?  ?
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
## 2021-05-06 NOTE — Discharge Summary (Signed)
?Physician Discharge Summary ?  ?Patient: [name] MRN: [id number] DOB: 1960-02-26  ?Admit date:     04/23/2021  ?Discharge date: 05/06/21  ?Discharge Physician: Fritzi Mandes  ? ?PCP: Marguerita Merles, MD  ? ?Recommendations at discharge:  ? ?follow-up with Dr. Luana Shu after your discharge from CIR ?follow-up urology Dr. Erlene Quan for voiding trial/possible Foley removal ?follow-up Dr. Saunders Revel in 2 to 3 weeks ? ?Discharge Diagnoses: ?acute right side PE ?right foot osteomyelitis status post surgery ?acute urinary retention status post Foley catheter placement ?peripheral arterial disease ? ?Hospital Course: ? ?[name] is a 61 y.o. male with medical history significant of hypertension, hyperlipidemia, diabetes mellitus, CVA 2020, osteomyelitis s/p  right 2nd s/p great toe amputation who presented to the ED via EMS with complaints of worsening generalized weakness for 2-3 days. EMS reported found patient laying in urine soaked clothing.  Patient reported no acute complaints other than profound fatigue ?  ?CT head showed new subacute to chronic right cerebellar infarct and chronic small vessel ischemic disease and generalized atrophy.   ?  ?Subsequent CTA chest revealed bilateral pulmonary emboli with no evidence of right heart strain.  Started on heparin infusion and has since been transitioned to Eliquis (started 4/3 PM).  Lower extremity doppler U/S negative for DVT's. ?  ?Assessment and Plan: ?Acute pulmonary embolism ([place]) ?--Patient presented with profound fatigue and generalized weakness, failure to thrive x3 days.  There was concern for acute stroke but MRI was negative and patient did not have any new focal neurologic deficits.  ?CTA chest showed pulmonary emboli in the distal right pulmonary artery, proximal right upper middle and lower lobe pulmonary artery branches.  No evidence of right heart strain. ?--Lower extremity Doppler ultrasound negative for DVTs bilaterally. ?--Was on heparin drip now Transitioned  to Eliquis. ?-- Continue Eliquis 10 mg PO BID x 7 days, then 5 mg PO BID--pharmacy to manage ?  ?Foot osteomyelitis, right ([place]) ?--History of prior right great toe amputation due to osteomyelitis in the setting of PAD.  Patient  Has open wound at the site, foul odor and localized swelling.  MRI shows osteomyelitis of the first metatarsal head. ?4/7: underwent angiography with Dr. Lucky Cowboy. Further revascularization is needed given findings of "..occlusion of the distal popliteal artery, occlusion of the tibioperoneal trunk, and occlusion of all 3 tibial vessels proximally with the posterior tibial artery being the only runoff to the foot distally after reconstitution in the proximal segment." ?4/10: underwent RLE angiogram with angioplasty to R posterior tibial artery, tibioperoneal trunk, and popliteal artery.  Stent placed in popliteal artery. ?4/11: pt scheduled for IND with debridement  right foot per Dr Luana Shu with possible likely 1st ray amputation ? 4/12: s/p partial right first ray amputation ?--Consults: Podiatry, Vascular surgery, Infectious disease ?--Weightbearing as tolerated on the right foot in surgical shoe ?  ?Hyponatremia ?-- Daily weights and strict I/O's ?-- sodium 130  ?  ?Chronic diastolic CHF (congestive heart failure) ([place]) ?--Appears euvolemic to slightly dry. ?--Echo 04/26/2021 with EF 60 to 65%, mild LVH, grade 1 diastolic dysfunction, mild AR. ?--f/u Dr End cardiology ?  ?Acute urinary retention ?Notified by RN of pt difficulty voiding (4/8 afternoon).  Pt reports some difficulty with voiding prior to admission also. ?-- Started on Flomax. ?--Patient required multiple in and out catheterizations. ?-- Foley placed afternoon of 4/9 ?-- Urology follow up outpatient--with Dr Diamantina Providence on 5/3 at 8:15 am ?  ?Peripheral artery disease ([place]) ?--With history of right great toe amputation  secondary to osteomyelitis as complication of PAD.  Now with right foot osteomyelitis, undergoing vascular evaluation  and attempted revascularization. ?--Continue Eliquis, Lipitor ?  ?Generalized weakness ?PT and OT recommend SNF for rehab.  Patient's sister requested consideration for CIR, referral was placed by TOC.  If not accepted by CIR, prefer home with home health, declined SNF placement. ?--Continue PT and OT. ?-- Out of bed to chair ?-- Mobilize frequently as tolerated ?-- Fall precautions ?  ?Open wound of right foot ?History of R great toe amputation due to osteomyelitis.  Hx of PAD.   ?Right foot xray without signs of osteo (4/4). ?Wound noted to be open and malodorous, no purulent drainage seen on my exam, serosanguinous drainage on gauze dressing.  MRI confirmed osteomyelitis. ?--WOC consulted ?-- Continue wound care per instructions ?--IV unasyn started by ID (4/11)--change to po augmentin for 1 week per ID ?   ?Seizure disorder ([place]) ?Continue Keppra ?  ?HTN (hypertension) ?--cont amlopdipine 10 mg qd ?-- As needed oral hydralazine for SBP >160 ?  ?Stroke [facility]) ?Hx of CVA 2020.   ?Head CT on admission with subacute R cerebellar infarct, but MRI negative for acute CVA.   ?--Neurology consulted ?--Continue Eliquis, Lipitor ?--Stop ASA while on Eliquis, resume when off anticoagulation ?--PT/OT/SLP recommend SNF, family requesting CIR --pt will transfer to CIR today ?  ?Diabetes mellitus type II, non insulin dependent ([place]) ?Hold metformin. ?CBGs are controlled without insulin coverage. ?Monitor CBGs and add sliding scale NovoLog if consistently above 180. ?  ?  ?  ?Procedures: as above ?Family communication :sister [name] on the phone. Agrees with plan ?Consults :vascular, podiatry, infectious disease ?CODE STATUS: full ?DVT Prophylaxis : ? ?  ? ? ?Consultants: podiatry, [place], cardiology, vascular surgery ?Procedures performed: as above ?Disposition: CIR ?Diet recommendation:  ?Discharge Diet Orders (From admission, onward)  ? ?  Start     Ordered  ? 05/06/21 0000  Diet - low sodium heart healthy       ? 05/06/21 0931   ? 05/06/21 0000  Diet Carb Modified       ? 05/06/21 0931  ? ?  ?  ? ?  ? ?Cardiac and Carb modified diet ?DISCHARGE MEDICATION: ?Allergies as of 05/06/2021   ?No Known Allergies ?  ? ?  ?Medication List  ?  ? ?STOP taking these medications   ? ?aspirin 81 MG EC tablet ?  ?clopidogrel 75 MG tablet ?Commonly known as: PLAVIX ?  ?metFORMIN 1000 MG tablet ?Commonly known as: GLUCOPHAGE ?  ? ?  ? ?TAKE these medications   ? ?amLODipine 2.5 MG tablet ?Commonly known as: NORVASC ?Take 2.5 mg by mouth daily. ?  ?amoxicillin-clavulanate 875-125 MG tablet ?Commonly known as: AUGMENTIN ?Take 1 tablet by mouth every 12 (twelve) hours for 13 doses. ?  ?apixaban 5 MG Tabs tablet ?Commonly known as: ELIQUIS ?Take 1 tablet (5 mg total) by mouth 2 (two) times daily. ?  ?atorvastatin 40 MG tablet ?Commonly known as: LIPITOR ?Take 1 tablet (40 mg total) by mouth daily at 6 PM. ?  ?guaiFENesin 100 MG/5ML liquid ?Commonly known as: ROBITUSSIN ?Take 5 mLs by mouth every 4 (four) hours as needed for cough or to loosen phlegm. ?  ?levETIRAcetam 500 MG tablet ?Commonly known as: KEPPRA ?Take 500 mg by mouth 2 (two) times daily. ?  ?senna-docusate 8.6-50 MG tablet ?Commonly known as: Senokot-S ?Take 1 tablet by mouth at bedtime as needed for mild constipation. ?  ?tamsulosin 0.4 MG Caps  capsule ?Commonly known as: FLOMAX ?Take 1 capsule (0.4 mg total) by mouth daily after supper. ?  ?traMADol 50 MG tablet ?Commonly known as: ULTRAM ?Take 1 tablet (50 mg total) by mouth every 6 (six) hours as needed for severe pain or moderate pain. ?  ? ?  ? ?  ?  ? ? ?  ?Discharge Care Instructions  ?(From admission, onward)  ?  ? ? ?  ? ?  Start     Ordered  ? 05/06/21 0000  Discharge wound care:       ?Comments: Per Dr Luana Shu --Podiatry-- he can leave his bandage on for the next week until visit in clinic  heel contact in surgical shoe  ? 05/06/21 0931  ? ?  ?  ? ?  ? ? Follow-up Information   ? ? Kris Hartmann, NP Follow up in 3 week(s).   ?Specialty:  Vascular Surgery ?Why: with ABIs ?Contact information: ?[address] ?[place] [address] ?[phone] ? ? ?  ?  ? ? Caroline More, DPM. Schedule an appointment as soon as possible for a visit in 1 we

## 2021-05-06 NOTE — Progress Notes (Signed)
Report called to Pearline Cables, Therapist, sports at [facility]. Pt transported via [facility]. Pt transported with IV in place and intact. Foley continued. Belongings transported with pt.  ?

## 2021-05-06 NOTE — Discharge Summary (Signed)
Physician Discharge Summary  ?Patient ID: ?[name] ?MRN: [id number] ?DOB/AGE: 59-Jun-1962 59 y.o. ? ?Admit date: 05/06/2021 ?Discharge date: 06/03/2021 ? ?Discharge Diagnoses:  ?Principal Problem: ?  Pulmonary embolism ([place]) ?Active Problems: ?  Essential hypertension ?  Acute pulmonary embolism ([place]) ?  Controlled type 2 diabetes mellitus with hyperglycemia, without long-term current use of insulin ([place]) ?  Fever ?  Osteomyelitis ([place]) ?Right first ray partial amputation ?Peripheral arterial disease ?History of CVA ?Generalized weakness ?Dyslipidemia ?Acute pulmonary embolism ?Hypertension ?Diabetes mellitus ?Seizure disorder ?Urinary retention ?Chronic diastolic congestive heart failure ?Constipation ?Hyponatremia ?Fungal infection, foot ? ?Discharged Condition: stable ? ?Significant Diagnostic Studies: ?DG Chest 2 View ? ?Result Date: 05/24/2021 ?CLINICAL DATA:  Fever EXAM: CHEST - 2 VIEW COMPARISON:  04/29/2021 FINDINGS: The heart size and mediastinal contours are within normal limits. Both lungs are clear. The visualized skeletal structures are unremarkable. IMPRESSION: No active cardiopulmonary disease. Electronically Signed   By: Randa Ngo M.D.   On: 05/24/2021 23:45  ? ?MR FOOT RIGHT WO CONTRAST ? ?Result Date: 05/26/2021 ?CLINICAL DATA:  Bone mass or bone pain, foot, aggressive features on xray EXAM: MRI OF THE RIGHT FOREFOOT WITHOUT CONTRAST TECHNIQUE: Multiplanar, multisequence MR imaging of the right forefoot was performed. No intravenous contrast was administered. COMPARISON:  Small radiograph 05/25/2021, right foot MRI 04/27/2021 FINDINGS: Bones/Joint/Cartilage Prior great toe amputation and recent first metatarsal head resection. There is marrow edema within the residual first metatarsal, with low T1 signal at the distal resection margin. Prior second toe amputation. Unchanged appearance of the second metatarsal head with small erosions on the margins, and otherwise no significant marrow edema or  low T1 signal. There is no other significant marrow signal alteration in the forefoot. Ligaments Intact Lisfranc ligament. Muscles and Tendons Diffuse intramuscular edema muscle atrophy in the foot as is commonly seen in diabetics. There is a collection of fluid along the expected course of the adductor hallucis tendon, which is likely tenosynovial fluid related to the previous first toe amputation (series 7, image 15). Soft tissues Diffuse soft tissue swelling of the foot. There is no definite drainable collection underlying the amputation site. IMPRESSION: Prior great toe amputation and recent first metatarsal head resection. There is marrow edema and some low T1 signal at the distal resection margin in the residual first metatarsal, favored to represent postsurgical changes due to recent amputation, though developing osteomyelitis could have a similar appearance. Prior second toe amputation. Unchanged appearance of the second metatarsal head without evidence of osteomyelitis. Fluid collecting along the expected course of the adductor hallucis tendon, likely reactive tenosynovial fluid related to the recent amputation. Forefoot soft tissue swelling without other evidence of well-defined/drainable fluid collection underlying the amputation site. Electronically Signed   By: Maurine Simmering M.D.   On: 05/26/2021 10:12  ? ?DG Foot 2 Views Right ? ?Result Date: 05/04/2021 ?CLINICAL DATA:  Postoperative. EXAM: RIGHT FOOT - 2 VIEW COMPARISON:  Right foot radiographs 04/26/2021 FINDINGS: Interval postsurgical changes of progressive amputation of the first ray to the mid metatarsal shaft. Unchanged amputation of second ray to the distal aspect of the metatarsal head. The new first metatarsal amputation site appears sharp and well-defined. Mild chronic spurring at the Achilles insertion on the calcaneus. Vascular calcifications are noted. No acute fracture or dislocation. IMPRESSION: Interval repeat amputation of the first ray,  now to the mid metatarsal shaft. No suspicious cortical erosion. Electronically Signed   By: Yvonne Kendall M.D.   On: 05/04/2021 18:20  ? ?DG Foot Complete  Right ? ?Result Date: 05/25/2021 ?CLINICAL DATA:  Fever EXAM: RIGHT FOOT COMPLETE - 3 VIEW COMPARISON:  Foot radiograph dated May 04, 2021 FINDINGS: Prior distal first metatarsal and great toe amputation. Prior second toe amputation. No evidence of fracture or dislocation. Increased osseous irregularity of the distal second metatarsal. Soft tissue swelling of the forefoot IMPRESSION: Increased osseous irregularity of the distal second metatarsal when compared with prior radiograph. Findings are concerning for osteomyelitis, this could be further evaluated with MRI. Electronically Signed   By: Yetta Glassman M.D.   On: 05/25/2021 08:32   ? ?Labs:  ? ?  Latest Ref Rng & Units 05/25/2021  ?  5:46 AM 05/24/2021  ? 10:24 PM 05/24/2021  ?  9:09 AM  ?CBC  ?WBC 4.0 - 10.5 K/uL 4.6   4.7   5.7    ?Hemoglobin 13.0 - 17.0 g/dL 10.7   12.3   11.6    ?Hematocrit 39.0 - 52.0 % 32.4   36.1   35.2    ?Platelets 150 - 400 K/uL 178   191   193    ?  ? ? ?  Latest Ref Rng & Units 05/30/2021  ?  5:44 AM 05/24/2021  ? 10:24 PM 05/23/2021  ?  6:50 AM  ?BMP  ?Glucose 70 - 99 mg/dL 83   218   176    ?BUN 6 - 20 mg/dL 19   31   24     ?Creatinine 0.61 - 1.24 mg/dL 0.81   0.96   0.98    ?Sodium 135 - 145 mmol/L 133   133   134    ?Potassium 3.5 - 5.1 mmol/L 3.5   3.6   3.5    ?Chloride 98 - 111 mmol/L 103   100   102    ?CO2 22 - 32 mmol/L 25   22   25     ?Calcium 8.9 - 10.3 mg/dL 8.7   8.7   8.9    ?  ?  ?CBG (last 3)  ?Recent Labs  ?  06/02/21 ?1653 06/02/21 ?0321 06/03/21 ?2248  ?GLUCAP 136* 131* 76  ?  ? ? ?Brief HPI:   [name] is a 61 y.o. male developed generalized weakness over the course of several days and was brought to [facility] emergency department on 04/23/2021 by EMS.  CT head showed new subacute to chronic right cerebellar hemisphere infarct but MRI was  negative for acute CVA.  Elevated sensitive troponins and negative EKG.  Cardiology consulted heparin infusion initiated.  CTA of the chest revealed pulmonary embolism.  Transition to Eliquis.  Chronic right foot wound at toe amputation sites.  Podiatry and vascular surgery consulted.  Underwent arteriogram with angioplasty of the tibial vessels and right popliteal artery stent placement.  Underwent right first ray partial amputation. ? ? ?Hospital Course: [name] was admitted to rehab 05/06/2021 for inpatient therapies to consist of PT, ST and OT at least three hours five days a week. Past admission physiatrist, therapy team and rehab RN have worked together to provide customized collaborative inpatient rehab.  His Foley catheter was discontinued on 4/15 and Flomax continued.  Serum sodium 130 noted on labs dated 4/12.  Was improved to 133 on follow-up labs 4/17.  Urinary retention persists and Urecholine started 4/18. Bradykinesia, minimal cogwheeling and masked facies noted. No diagnosis of PD. May be due to remote right subcortical infarcts. Fitted with Darco sandal to avoid weight bearing through forefoot. UA performed  4/29>>positive for leukocytes and nitrites. Temp to 100.2 on 4/30. Rocephin 1 gram daily started. Functional decline noted on 5/2. Was min A previous week, now requiring 2 person assist. Also with poor PO intake and orthostatic BP.  Febrile to 102.9 evening of 5/2. Remained hemodynamically stable. Given NS IVF bolus followed by maintenance NS IVFs and started on Vancomycin in addition to ongoing Rocephin. Awaiting urine culture results. Lactic acidosis resolved. No clinical evidence of pneumonia or wound infection. Normal WBC and procalcitonin. Nothing acute on foot x-ray. Fever down to 99.1 on morning of 5/3. Patient in NAD, eating and with stable BP.  BC  with no growth at 2 days. Infectious consultation and MRI of foot obtained 5/4. IVFs discontinued. MRI negative for infection. Sutures  removed. Blood cultures negative at 5 days. UTI resolved and antibiotics discontinued 5/4. No fever as of 5/3. Amputation site continues to heal well. Poor sleep hygiene and melatonin increased to 5 mg at

## 2021-05-06 NOTE — Progress Notes (Addendum)
Patient ID: Caster Fayette, male   DOB: Feb 05, 1960, 60 y.o.   MRN: [id number] ?Met with the patient to review rehab process, team conference and plan of care. Patient reported concerns with foley removal and skin care management; reports brother keeps up with the medications. Has a flight of steps up to his apt. Also reviewed secondary risks including diabetes (A1 C 6.0), HTN, HLD (33/70) and dietary modification recommendations. Continue to follow along to discharge to address educational needs to facilitate preparation for discharge. Dorien Chihuahua B ? ?

## 2021-05-06 NOTE — Progress Notes (Signed)
Patient arrived on unit. Rehab schedule, medications and plan of care reviewed, patient states an understanding. Patient is FY9-2 and has no complications noted at this time. Patient reports pain 0 out of 10. Patient educated on use of call light. Call light within reach bed alarms in place.  ? ?[name]   ?

## 2021-05-06 NOTE — TOC Progression Note (Signed)
Transition of Care (TOC) - Progression Note  ? ? ?Patient Details  ?Name: [name] ?MRN: [id number] ?Date of Birth: 24-Jan-1960 ? ?Transition of Care (TOC) CM/SW Contact  ?Pete Pelt, RN ?Phone Number: ?05/06/2021, 10:12 AM ? ?Clinical Narrative:  Patient going to CIR today.  Will transport by [facility] ?  ? ? ? ?Expected Discharge Plan:  (TBD) ?Barriers to Discharge: Continued Medical Work up ? ?Expected Discharge Plan and Services ?Expected Discharge Plan:  (TBD) ?  ?  ?Post Acute Care Choice:  (TBD) ?Living arrangements for the past 2 months: Apartment ?Expected Discharge Date: 05/06/21               ?  ?  ?  ?  ?  ?  ?  ?  ?  ?  ? ? ?Social Determinants of Health (SDOH) Interventions ?  ? ?Readmission Risk Interventions ?   ? View : No data to display.  ?  ?  ?  ? ? ?
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
## 2021-05-06 NOTE — Progress Notes (Addendum)
Inpatient Rehabilitation Admission Medication Review by a Pharmacist ? ?A complete drug regimen review was completed for this patient to identify any potential clinically significant medication issues. ? ?High Risk Drug Classes Is patient taking? Indication by Medication  ?Antipsychotic Yes Compazine - N/V  ?Anticoagulant Yes Eliquis - PE  ?Antibiotic Yes Augmentin - chronic osteomyelitis s/p therapuetic amputation of right partial first ray with excision of wound and closure 4/12 (stop 4/21 after last dose)  ?Opioid Yes Tramadol - acute pain s/p surgery  ?Antiplatelet Yes ASA - PAD  ?Hypoglycemics/insulin No   ?Vasoactive Medication Yes Norvasc - HTN  ?Chemotherapy No   ?Other Yes Lipitor - HLD, PAD ?Flomax  - acute urinary retention ?Keppra - seizure disorder ?Robaxin - muscle spasms ?Trazodone - sleep  ? ? ? ?Type of Medication Issue Identified Description of Issue Recommendation(s)  ?Drug Interaction(s) (clinically significant) ?    ?Duplicate Therapy ?    ?Allergy ?    ?No Medication Administration End Date ?    ?Incorrect Dose ?    ?Additional Drug Therapy Needed ?    ?Significant med changes from prior encounter (inform family/care partners about these prior to discharge).    ?Other ? PTA meds: ?Plavix ?Metformin  Restart PTA meds if and when clinically necessary during CIR admission or at time of discharge.  ? ? ?Clinically significant medication issues were identified that warrant physician communication and completion of prescribed/recommended actions by midnight of the next day:  No ? ?Time spent performing this drug regimen review (minutes):  30 ? ? ?Dani Anastasov ?05/06/2021 12:40 PM ? ?Agree with the contents of this note ? ?Remedy Corporan BS, PharmD, BCPS ?Clinical Pharmacist ?05/06/2021 1:54 PM ? ?Contact: [phone] after 3 PM ? ?"Be curious, not judgmental..." -Jamal Maes ?

## 2021-05-06 NOTE — Discharge Instructions (Addendum)
Podiatry (Foot and Ankle) discharge instructions: ?1.  Keep surgical dressings to your left foot clean, dry, and intact until your postoperative appointment in clinic.  If the dressings become wet or saturated or loosened, please contact our office for instructions.  If able to change the dressing then do so by removing the dressing, apply nonadherent gauze to the incision site, 4 x 4 gauze, roll gauze, Ace wrap.  If unable to make an appointment within the next week, okay to change dressing once a week consisting of removing dressing and cleaning the foot with normal sterile saline, dry clean, apply Xeroform gauze to the incision line followed by 4 x 4 gauze, Kerlix, Ace wrap with minimal to no compression. ?2.  Partial weightbearing with heel contact in surgical shoe right foot. ?3.  Please take antibiotics that were prescribed until gone. ?4.  Make an appointment for 1 week after discharge date for further evaluation of your incision and care. ? ?

## 2021-05-06 NOTE — Progress Notes (Signed)
Foley removed per order. Patient tolerated well. Urine color pink tinged, cloudy. Void trail started. Pt educated to call when need to urinate, call bell within reach, bed alarm on.  ? ?[name] ? ?

## 2021-05-07 DIAGNOSIS — E1165 Type 2 diabetes mellitus with hyperglycemia: Secondary | ICD-10-CM

## 2021-05-07 DIAGNOSIS — I10 Essential (primary) hypertension: Secondary | ICD-10-CM

## 2021-05-07 DIAGNOSIS — E871 Hypo-osmolality and hyponatremia: Secondary | ICD-10-CM | POA: Diagnosis not present

## 2021-05-07 DIAGNOSIS — I2699 Other pulmonary embolism without acute cor pulmonale: Secondary | ICD-10-CM | POA: Diagnosis not present

## 2021-05-07 NOTE — Plan of Care (Signed)
?  Problem: RH Balance ?Goal: LTG: Patient will maintain dynamic sitting balance (OT) ?Description: LTG:  Patient will maintain dynamic sitting balance with assistance during activities of daily living (OT) ?Flowsheets (Taken 05/07/2021 1251) ?LTG: Pt will maintain dynamic sitting balance during ADLs with: Independent with assistive device ?Goal: LTG Patient will maintain dynamic standing with ADLs (OT) ?Description: LTG:  Patient will maintain dynamic standing balance with assist during activities of daily living (OT)  ?Flowsheets (Taken 05/07/2021 1251) ?LTG: Pt will maintain dynamic standing balance during ADLs with: Supervision/Verbal cueing ?  ?Problem: Sit to Stand ?Goal: LTG:  Patient will perform sit to stand in prep for activites of daily living with assistance level (OT) ?Description: LTG:  Patient will perform sit to stand in prep for activites of daily living with assistance level (OT) ?Flowsheets (Taken 05/07/2021 1251) ?LTG: PT will perform sit to stand in prep for activites of daily living with assistance level: Supervision/Verbal cueing ?  ?Problem: RH Grooming ?Goal: LTG Patient will perform grooming w/assist,cues/equip (OT) ?Description: LTG: Patient will perform grooming with assist, with/without cues using equipment (OT) ?Flowsheets (Taken 05/07/2021 1251) ?LTG: Pt will perform grooming with assistance level of: Independent with assistive device  ?  ?Problem: RH Bathing ?Goal: LTG Patient will bathe all body parts with assist levels (OT) ?Description: LTG: Patient will bathe all body parts with assist levels (OT) ?Flowsheets (Taken 05/07/2021 1251) ?LTG: Pt will perform bathing with assistance level/cueing: Supervision/Verbal cueing ?  ?Problem: RH Dressing ?Goal: LTG Patient will perform upper body dressing (OT) ?Description: LTG Patient will perform upper body dressing with assist, with/without cues (OT). ?Flowsheets (Taken 05/07/2021 1251) ?LTG: Pt will perform upper body dressing with assistance  level of: Independent with assistive device ?Goal: LTG Patient will perform lower body dressing w/assist (OT) ?Description: LTG: Patient will perform lower body dressing with assist, with/without cues in positioning using equipment (OT) ?Flowsheets (Taken 05/07/2021 1251) ?LTG: Pt will perform lower body dressing with assistance level of: ? Including orthosis ? Minimal Assistance - Patient > 75% ?  ?Problem: RH Toileting ?Goal: LTG Patient will perform toileting task (3/3 steps) with assistance level (OT) ?Description: LTG: Patient will perform toileting task (3/3 steps) with assistance level (OT)  ?Flowsheets (Taken 05/07/2021 1251) ?LTG: Pt will perform toileting task (3/3 steps) with assistance level: Supervision/Verbal cueing ?  ?Problem: RH Toilet Transfers ?Goal: LTG Patient will perform toilet transfers w/assist (OT) ?Description: LTG: Patient will perform toilet transfers with assist, with/without cues using equipment (OT) ?Flowsheets (Taken 05/07/2021 1251) ?LTG: Pt will perform toilet transfers with assistance level of: Supervision/Verbal cueing ?  ?Problem: RH Tub/Shower Transfers ?Goal: LTG Patient will perform tub/shower transfers w/assist (OT) ?Description: LTG: Patient will perform tub/shower transfers with assist, with/without cues using equipment (OT) ?Flowsheets (Taken 05/07/2021 1251) ?LTG: Pt will perform tub/shower stall transfers with assistance level of: Supervision/Verbal cueing ?  ?

## 2021-05-07 NOTE — Progress Notes (Signed)
Physical Therapy Session Note ? ?Patient Details  ?Name: [name] ?MRN: [id number] ?Date of Birth: 1960-09-01 ? ?Today's Date: 05/07/2021 ?PT Individual Time: 6838-7065 ?PT Individual Time Calculation (min): 55 min  ? ?Short Term Goals: ?Week 1:  PT Short Term Goal 1 (Week 1): pt will perform bed mobility with CGA ?PT Short Term Goal 2 (Week 1): Pt will trasnfer to [facility] with min assist consistently ?PT Short Term Goal 3 (Week 1): Pt will ambulate 173f with min assist and LRAD ?PT Short Term Goal 4 (Week 1): Pt will ascend 8 steps with min assist ? ?Skilled Therapeutic Interventions/Progress Updates:  ?Pt received supine in bed and agreeable to PT. Supine>sit transfer with min assist at trunk and cues for BLE position EOB. PT assisted to don bil footwear. [facility].  ? ?Pt performed 5 time sit<>stand (5xSTS): 64 sec (>15 sec indicates increased fall risk) mod assist throughout with cues for LUE to push from arm rest with poor carryover  ? ?PT instructed pt in TUG: 77 sec (average of 3 trials; >13.5 sec indicates increased fall risk) ? ?Patient demonstrates increased fall risk as noted by score of   6/56 on Berg Balance Scale.  (<36= high risk for falls, close to 100%; 37-45 significant >80%; 46-51 moderate >50%; 52-55 lower >25%) ? ?Pt noted to have significant posterior bias in standing without UE support. PT performed Bil heel cord stretch with LLE in knee extension and RLE in knee flexion to limit pressure on distal R foot. Performed 2 x 1 min bil.  ? ?Pt returned to room and performed ambulatory transfer to bed with RW and mod assist progressing to min assist. Sit>supine completed with min assist at trunk to improve trunkal position in supine, and left supine in bed with call bell in reach and all needs met.  ? ?   ? ?Therapy Documentation ?Precautions:  ?Precautions ?Precautions: Fall ?Precaution Comments: baseline R HP and cognitive deficits ?Restrictions ?Weight Bearing  Restrictions: Yes ?RLE Weight Bearing: Weight bearing as tolerated (through heel with surgical shoe) ?Other Position/Activity Restrictions: heel WB with surgical shoe ? ?Vital Signs: ?Therapy Vitals ?Temp: 98.2 ?F (36.8 ?C) ?Pulse Rate: 83 ?Resp: 15 ?BP: 136/78 ?Patient Position (if appropriate): Lying ?Oxygen Therapy ?SpO2: 100 % ?O2 Device: Room Air ?Pain: ?Pain Assessment ?Pain Scale: Faces ?Pain Score: 0-No pain ? ? ? ? ?Therapy/Group: Individual Therapy ? ?ALorie Phenix?05/07/2021, 4:41 PM  ?
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
## 2021-05-07 NOTE — Plan of Care (Signed)
?  Problem: RH Balance ?Goal: LTG Patient will maintain dynamic sitting balance (PT) ?Description: LTG:  Patient will maintain dynamic sitting balance with assistance during mobility activities (PT) ?Flowsheets (Taken 05/07/2021 1814) ?LTG: Pt will maintain dynamic sitting balance during mobility activities with:: Supervision/Verbal cueing ?Goal: LTG Patient will maintain dynamic standing balance (PT) ?Description: LTG:  Patient will maintain dynamic standing balance with assistance during mobility activities (PT) ?Flowsheets (Taken 05/07/2021 1814) ?LTG: Pt will maintain dynamic standing balance during mobility activities with:: Supervision/Verbal cueing ?  ?Problem: RH Bed Mobility ?Goal: LTG Patient will perform bed mobility with assist (PT) ?Description: LTG: Patient will perform bed mobility with assistance, with/without cues (PT). ?Flowsheets (Taken 05/07/2021 1814) ?LTG: Pt will perform bed mobility with assistance level of: Supervision/Verbal cueing ?  ?Problem: RH Bed to Chair Transfers ?Goal: LTG Patient will perform bed/chair transfers w/assist (PT) ?Description: LTG: Patient will perform bed to chair transfers with assistance (PT). ?Flowsheets (Taken 05/07/2021 1814) ?LTG: Pt will perform Bed to Chair Transfers with assistance level: Supervision/Verbal cueing ?  ?Problem: RH Car Transfers ?Goal: LTG Patient will perform car transfers with assist (PT) ?Description: LTG: Patient will perform car transfers with assistance (PT). ?Flowsheets (Taken 05/07/2021 1814) ?LTG: Pt will perform car transfers with assist:: Contact Guard/Touching assist ?  ?Problem: RH Ambulation ?Goal: LTG Patient will ambulate in controlled environment (PT) ?Description: LTG: Patient will ambulate in a controlled environment, # of feet with assistance (PT). ?Flowsheets (Taken 05/07/2021 1814) ?LTG: Pt will ambulate in controlled environ  assist needed:: Supervision/Verbal cueing ?LTG: Ambulation distance in controlled environment: 148ft  with LRAD ?Goal: LTG Patient will ambulate in home environment (PT) ?Description: LTG: Patient will ambulate in home environment, # of feet with assistance (PT). ?Flowsheets (Taken 05/07/2021 1814) ?LTG: Pt will ambulate in home environ  assist needed:: Supervision/Verbal cueing ?LTG: Ambulation distance in home environment: 65ft with lRAD ?  ?Problem: RH Wheelchair Mobility ?Goal: LTG Patient will propel w/c in controlled environment (PT) ?Description: LTG: Patient will propel wheelchair in controlled environment, # of feet with assist (PT) ?Flowsheets (Taken 05/07/2021 1814) ?LTG: Pt will propel w/c in controlled environ  assist needed:: Supervision/Verbal cueing ?LTG: Propel w/c distance in controlled environment: 194ft ?  ?Problem: RH Stairs ?Goal: LTG Patient will ambulate up and down stairs w/assist (PT) ?Description: LTG: Patient will ambulate up and down # of stairs with assistance (PT) ?Flowsheets (Taken 05/07/2021 1814) ?LTG: Pt will ambulate up/down stairs assist needed:: Minimal Assistance - Patient > 75% ?LTG: Pt will  ambulate up and down number of stairs: 12 with BUE support ?  ?

## 2021-05-07 NOTE — Progress Notes (Signed)
[place] PHYSICAL MEDICINE & REHABILITATION PROGRESS NOTE ? ?Subjective/Complaints: ?Patient seen sitting up in his chair this morning working with therapies.  He states he slept well overnight.  He notes he has not had a bowel movement, but does not want meds at present. ? ?ROS: Denies CP, SOB, N/V/D ? ? ?Objective: ?Vital Signs: ?Blood pressure 133/77, pulse 77, temperature 97.8 ?F (36.6 ?C), resp. rate 16, height 5\' 7"  (1.702 m), weight 71 kg, SpO2 100 %. ?No results found. ?No results for input(s): WBC, HGB, HCT, PLT in the last 72 hours. ?No results for input(s): NA, K, CL, CO2, GLUCOSE, BUN, CREATININE, CALCIUM in the last 72 hours. ? ?Intake/Output Summary (Last 24 hours) at 05/07/2021 0940 ?Last data filed at 05/07/2021 0746 ?Gross per 24 hour  ?Intake 837 ml  ?Output 600 ml  ?Net 237 ml  ?  ? ?  ? ?Physical Exam: ?BP 133/77 (BP Location: Right Arm)   Pulse 77   Temp 97.8 ?F (36.6 ?C)   Resp 16   Ht 5\' 7"  (1.702 m)   Wt 71 kg   SpO2 100%   BMI 24.52 kg/m?  ?Constitutional: No distress . Vital signs reviewed. ?HENT: Normocephalic.  Atraumatic. ?Eyes: EOMI. No discharge. ?Cardiovascular: No JVD.  RRR. ?Respiratory: Normal effort.  No stridor.  Bilateral clear to auscultation. ?GI: Non-distended.  BS +. ?Skin: Warm and dry.  Intact. ?Psych: Slowed ?Musc:    Right amp incision with dressing CDI ?Neuro: 4-/5 throughout ? ? ?Assessment/Plan: ?1. Functional deficits which require 3+ hours per day of interdisciplinary therapy in a comprehensive inpatient rehab setting. ?Physiatrist is providing close team supervision and 24 hour management of active medical problems listed below. ?Physiatrist and rehab team continue to assess barriers to discharge/monitor patient progress toward functional and medical goals ? ? ?Care Tool: ? ?Bathing ?   ?Body parts bathed by patient: Right arm, Left arm, Face, Chest, Abdomen, Front perineal area  ?   ?  ?  ?Bathing assist Assist Level: Supervision/Verbal cueing ?  ?  ?Upper  Body Dressing/Undressing ?Upper body dressing   ?What is the patient wearing?: Pull over shirt, Hospital gown only ?   ?Upper body assist Assist Level: Maximal Assistance - Patient 25 - 49% ?   ?Lower Body Dressing/Undressing ?Lower body dressing ? ? ?   ?What is the patient wearing?: Underwear/pull up, Pants ? ?  ? ?Lower body assist Assist for lower body dressing: Total Assistance - Patient < 25% ?   ? ?Toileting ?Toileting Toileting Activity did not occur (Probation officer and hygiene only): N/A (no void or bm)  ?Toileting assist   ?  ?  ?Transfers ?Chair/bed transfer ? ?Transfers assist ?   ? ?Chair/bed transfer assist level: Moderate Assistance - Patient 50 - 74% ?  ?  ?Locomotion ?Ambulation ? ? ?Ambulation assist ? ?   ? ?  ?  ?   ? ?Walk 10 feet activity ? ? ?Assist ?   ? ?  ?   ? ?Walk 50 feet activity ? ? ?Assist   ? ?  ?   ? ? ?Walk 150 feet activity ? ? ?Assist   ? ?  ?  ?  ? ?Walk 10 feet on uneven surface  ?activity ? ? ?Assist   ? ? ?  ?   ? ?Wheelchair ? ? ? ? ?Assist   ?  ?  ? ?  ?   ? ? ?Wheelchair 50 feet with 2 turns activity ? ? ? ?  Assist ? ?  ?  ? ? ?   ? ?Wheelchair 150 feet activity  ? ? ? ?Assist ?   ? ? ?   ? ? ?Medical Problem List and Plan: ?1. Functional deficits secondary to peripheral arterial disease, foot surgery and acute pulmonary embolus. ?Continue CIR  ?2.  Antithrombotics: ?-DVT/anticoagulation:  Pharmaceutical: Other (comment) Eliquis ?            -antiplatelet therapy: Aspirin 81 mg daily ?3. Pain Management: Tylenol, tramadol as needed ? Monitor with increased exertion ?4. Onchomycosis of left toes: antifungal cream ordered BID.  ?5. Neuropsych: This patient is capable of making decisions on his own behalf. ?6. Right foot incision: ?            --Daily dry dressing change ?7. Fluids/Electrolytes/Nutrition: Routine I's and O's ?            -- Carb modified diet ?8: PAD: Status post popliteal stent placement ?            -- Continue aspirin while on Eliquis.  Stop Plavix. ?             -- Follow-up with Dr. Lucky Cowboy with ABIs ?            -- Status post right partial first ray amputation POD 2 ?            -- Right heel weightbearing with postop shoe ?9: prior CVA: ?10: Dyslipidemia: Continue Lipitor 40 mg daily ?11: Acute PE: Continue Eliquis ?12: Hypertension: Continue Norvasc 10 mg daily ? Monitor with increased activity ?13: DM type II with hyperglycemia: Restart metformin 250mg  daily.  ? Monitor increase mobility ?14: Seizure disorder maintained on Keppra: Continue 500 mg daily ?15: Constipation: give sorbitol times once dose now ?            --schedule senna BID ?16: Urinary retention: d/c foley ?            -- Continue Flomax ?            -- Consider voiding trials ?17: Chronic diastolic congestive heart failure, euvolemic, no diuretics ?            -- Grade 1 diastolic dysfunction on recent echo ?18.  Hyponatremia ? Sodium 130 on 4/12, observe for Monday ? ?LOS: 1 days ?A FACE TO FACE EVALUATION WAS PERFORMED ? ?[name] ?05/07/2021, 9:40 AM ? ?
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
## 2021-05-08 DIAGNOSIS — I10 Essential (primary) hypertension: Secondary | ICD-10-CM | POA: Diagnosis not present

## 2021-05-08 DIAGNOSIS — I2699 Other pulmonary embolism without acute cor pulmonale: Secondary | ICD-10-CM | POA: Diagnosis not present

## 2021-05-08 DIAGNOSIS — E871 Hypo-osmolality and hyponatremia: Secondary | ICD-10-CM | POA: Diagnosis not present

## 2021-05-08 DIAGNOSIS — E1165 Type 2 diabetes mellitus with hyperglycemia: Secondary | ICD-10-CM | POA: Diagnosis not present

## 2021-05-08 NOTE — Progress Notes (Signed)
Inpatient Rehabilitation  Patient information reviewed and entered into eRehab system by Ajene Carchi M. Linwood Gullikson, M.A., CCC/SLP, PPS Coordinator.  Information including medical coding, functional ability and quality indicators will be reviewed and updated through discharge.    

## 2021-05-08 NOTE — Progress Notes (Signed)
[place] PHYSICAL MEDICINE & REHABILITATION PROGRESS NOTE ? ?Subjective/Complaints: ?Patient seen sitting up in bed this morning.  He states he slept well overnight.  He states he had a good first day of therapies yesterday.  He denies complaints. ? ?ROS: Denies CP, SOB, N/V/D ? ? ?Objective: ?Vital Signs: ?Blood pressure 137/76, pulse 80, temperature 99.1 ?F (37.3 ?C), temperature source Oral, resp. rate 16, height 5\' 7"  (1.702 m), weight 71 kg, SpO2 100 %. ?No results found. ?No results for input(s): WBC, HGB, HCT, PLT in the last 72 hours. ?No results for input(s): NA, K, CL, CO2, GLUCOSE, BUN, CREATININE, CALCIUM in the last 72 hours. ? ?Intake/Output Summary (Last 24 hours) at 05/08/2021 0859 ?Last data filed at 05/08/2021 0725 ?Gross per 24 hour  ?Intake 720 ml  ?Output 800 ml  ?Net -80 ml  ? ?  ? ?  ? ?Physical Exam: ?BP 137/76 (BP Location: Left Arm)   Pulse 80   Temp 99.1 ?F (37.3 ?C) (Oral)   Resp 16   Ht 5\' 7"  (1.702 m)   Wt 71 kg   SpO2 100%   BMI 24.52 kg/m?  ?Constitutional: No distress . Vital signs reviewed. ?HENT: Normocephalic.  Atraumatic. ?Eyes: EOMI. No discharge. ?Cardiovascular: No JVD.  RRR. ?Respiratory: Normal effort.  No stridor.  Bilateral clear to auscultation. ?GI: Non-distended.  BS +. ?Skin: Warm and dry.  Intact. ?Psych: Normal behavior.  Normal affect. ?Musc:    Right amp incision with dressing CDI, unchanged ?Neuro: 4-/5 throughout, stable ? ? ?Assessment/Plan: ?1. Functional deficits which require 3+ hours per day of interdisciplinary therapy in a comprehensive inpatient rehab setting. ?Physiatrist is providing close team supervision and 24 hour management of active medical problems listed below. ?Physiatrist and rehab team continue to assess barriers to discharge/monitor patient progress toward functional and medical goals ? ? ?Care Tool: ? ?Bathing ?   ?Body parts bathed by patient: Right arm, Left arm, Face, Chest, Abdomen, Front perineal area  ?   ?  ?  ?Bathing assist  Assist Level: Supervision/Verbal cueing ?  ?  ?Upper Body Dressing/Undressing ?Upper body dressing   ?What is the patient wearing?: [place] only ?   ?Upper body assist Assist Level: Maximal Assistance - Patient 25 - 49% ?   ?Lower Body Dressing/Undressing ?Lower body dressing ? ? ?   ?What is the patient wearing?: Pants, Underwear/pull up ? ?  ? ?Lower body assist Assist for lower body dressing: Maximal Assistance - Patient 25 - 49% ?   ? ?Toileting ?Toileting Toileting Activity did not occur (Probation officer and hygiene only): N/A (no void or bm)  ?Toileting assist Assist for toileting: Maximal Assistance - Patient 25 - 49% ?  ?  ?Transfers ?Chair/bed transfer ? ?Transfers assist ?   ? ?Chair/bed transfer assist level: Moderate Assistance - Patient 50 - 74% ?  ?  ?Locomotion ?Ambulation ? ? ?Ambulation assist ? ?   ? ?Assist level: Moderate Assistance - Patient 50 - 74% ?Assistive device: Hand held assist ?Max distance: 10  ? ?Walk 10 feet activity ? ? ?Assist ?   ? ?Assist level: Moderate Assistance - Patient - 50 - 74% ?Assistive device: Hand held assist  ? ?Walk 50 feet activity ? ? ?Assist Walk 50 feet with 2 turns activity did not occur: Safety/medical concerns ? ?  ?   ? ? ?Walk 150 feet activity ? ? ?Assist Walk 150 feet activity did not occur: Safety/medical concerns ? ?  ?  ?  ? ?  Walk 10 feet on uneven surface  ?activity ? ? ?Assist Walk 10 feet on uneven surfaces activity did not occur: Safety/medical concerns ? ? ?  ?   ? ?Wheelchair ? ? ? ? ?Assist   ?  ?  ? ?Wheelchair assist level: Minimal Assistance - Patient > 75% ?Max wheelchair distance: 150  ? ? ?Wheelchair 50 feet with 2 turns activity ? ? ? ?Assist ? ?  ?  ? ? ?Assist Level: Minimal Assistance - Patient > 75%  ? ?Wheelchair 150 feet activity  ? ? ? ?Assist ?   ? ? ?Assist Level: Minimal Assistance - Patient > 75%  ? ? ?Medical Problem List and Plan: ?1. Functional deficits secondary to peripheral arterial disease, foot surgery and  acute pulmonary embolus. ?Continue CIR  ?2.  Antithrombotics: ?-DVT/anticoagulation:  Pharmaceutical: Other (comment) Eliquis ?            -antiplatelet therapy: Aspirin 81 mg daily ?3. Pain Management: Tylenol, tramadol as needed ? Monitor with increased exertion ?4. Onchomycosis of left toes: antifungal cream ordered BID.  ?5. Neuropsych: This patient is capable of making decisions on his own behalf. ?6. Right foot incision: ?            --Daily dry dressing change ?7. Fluids/Electrolytes/Nutrition: Routine I's and O's ?            -- Carb modified diet ?8: PAD: Status post popliteal stent placement ?            -- Continue aspirin while on Eliquis.  Stop Plavix. ?            -- Follow-up with Dr. Lucky Cowboy with ABIs ?            -- Status post right partial first ray amputation  ?            -- Right heel weightbearing with postop shoe ?9: prior CVA: ?10: Dyslipidemia: Continue Lipitor 40 mg daily ?11: Acute PE: Continue Eliquis ?12: Hypertension: Continue Norvasc 10 mg daily ? Controlled on 4/16 ? Monitor with increased activity ?13: DM type II with hyperglycemia: Restarted metformin 250mg  daily.  ? Monitor increase mobility ?14: Seizure disorder maintained on Keppra: Continue 500 mg daily ?15: Constipation: give sorbitol times once dose now ?            --schedule senna BID ?16: Urinary retention: d/c foley ?            -- Continue Flomax ? Improved ?17: Chronic diastolic congestive heart failure, euvolemic, no diuretics ?            -- Grade 1 diastolic dysfunction on recent echo ?18.  Hyponatremia ? Sodium 130 on 4/12, labs ordered for tomorrow ? ?LOS: 2 days ?A FACE TO FACE EVALUATION WAS PERFORMED ? ?Irisha Grandmaison [name] ?05/08/2021, 8:59 AM ? ?

## 2021-05-09 ENCOUNTER — Other Ambulatory Visit (HOSPITAL_COMMUNITY): Payer: Self-pay

## 2021-05-09 DIAGNOSIS — R5381 Other malaise: Secondary | ICD-10-CM | POA: Diagnosis not present

## 2021-05-09 LAB — CBC WITH DIFFERENTIAL/PLATELET
Abs Immature Granulocytes: 0.01 10*3/uL (ref 0.00–0.07)
Basophils Absolute: 0 10*3/uL (ref 0.0–0.1)
Basophils Relative: 1 %
Eosinophils Absolute: 0.1 10*3/uL (ref 0.0–0.5)
Eosinophils Relative: 2 %
HCT: 37.8 % — ABNORMAL LOW (ref 39.0–52.0)
Hemoglobin: 12.5 g/dL — ABNORMAL LOW (ref 13.0–17.0)
Immature Granulocytes: 0 %
Lymphocytes Relative: 21 %
Lymphs Abs: 1.2 10*3/uL (ref 0.7–4.0)
MCH: 29.2 pg (ref 26.0–34.0)
MCHC: 33.1 g/dL (ref 30.0–36.0)
MCV: 88.3 fL (ref 80.0–100.0)
Monocytes Absolute: 0.3 10*3/uL (ref 0.1–1.0)
Monocytes Relative: 6 %
Neutro Abs: 4 10*3/uL (ref 1.7–7.7)
Neutrophils Relative %: 70 %
Platelets: 244 10*3/uL (ref 150–400)
RBC: 4.28 MIL/uL (ref 4.22–5.81)
RDW: 12 % (ref 11.5–15.5)
WBC: 5.7 10*3/uL (ref 4.0–10.5)
nRBC: 0 % (ref 0.0–0.2)

## 2021-05-09 LAB — COMPREHENSIVE METABOLIC PANEL
ALT: 28 U/L (ref 0–44)
AST: 24 U/L (ref 15–41)
Albumin: 2.9 g/dL — ABNORMAL LOW (ref 3.5–5.0)
Alkaline Phosphatase: 60 U/L (ref 38–126)
Anion gap: 8 (ref 5–15)
BUN: 15 mg/dL (ref 6–20)
CO2: 25 mmol/L (ref 22–32)
Calcium: 9 mg/dL (ref 8.9–10.3)
Chloride: 100 mmol/L (ref 98–111)
Creatinine, Ser: 0.77 mg/dL (ref 0.61–1.24)
GFR, Estimated: >60 mL/min (ref >60)
Glucose, Bld: 143 mg/dL — ABNORMAL HIGH (ref 70–99)
Potassium: 3.9 mmol/L (ref 3.5–5.1)
Sodium: 133 mmol/L — ABNORMAL LOW (ref 135–145)
Total Bilirubin: 1 mg/dL (ref 0.3–1.2)
Total Protein: 7.9 g/dL (ref 6.5–8.1)

## 2021-05-09 NOTE — Discharge Instructions (Addendum)
Inpatient Rehab Discharge Instructions ? ?[name] ?Discharge date and time: 06/03/2021  ? ?Activities/Precautions/ Functional Status: ?Activity: no lifting, driving, or strenuous exercise till cleared by MD ?Diet:  chopped foods--diabetic diet/low fat ?Wound Care:  wash with soap and water. Keep clean and dry.  ? ? ?Functional status:  ?___ No restrictions              ___ Walk up steps independently ?_X__ 24/7 supervision/assistance   ___ Walk up steps with assistance ?___ Intermittent supervision/assistance  ___ Bathe/dress independently ?___ Walk with walker     ___ Bathe/dress with assistance ?___ Walk Independently            ___ Shower independently ?___ Walk with assistance            _X__ Shower with assistance ?_X__ No alcohol             ___ Return to work/school ________ ? ?Special Instructions: ? ?No driving, alcohol consumption or tobacco use.  ? ?COMMUNITY REFERRALS UPON DISCHARGE:   ? ?Home Health:   PT     OT     ST                   Agency: [facility]  Phone: [phone]  ? ? ?Medical Equipment/Items Ordered: Museum/gallery conservator ?                                                Agency/Supplier: [employer] [phone] ? ?My questions have been answered and I understand these instructions. I will adhere to these goals and the provided educational materials after my discharge from the hospital. ? ?Patient/Caregiver Signature _______________________________ Date __________ ? ?Clinician Signature _______________________________________ Date __________ ? ?Please bring this form and your medication list with you to all your follow-up doctor's appointments.   ? ?Information on my medicine - ELIQUIS? (apixaban) ? ?This medication education was reviewed with me or my healthcare representative as part of my discharge preparation.    ? ?Why was Eliquis? prescribed for you? ?Eliquis? was prescribed to treat blood clots that may have been found in the veins of your legs (deep vein thrombosis) or in  your lungs (pulmonary embolism) and to reduce the risk of them occurring again. ? ?What do You need to know about Eliquis? ? ?The dose is reduced to ONE 5 mg tablet taken TWICE daily.  Eliquis? may be taken with or without food.  ? ?Try to take the dose about the same time in the morning and in the evening. If you have difficulty swallowing the tablet whole please discuss with your pharmacist how to take the medication safely. ? ?Take Eliquis? exactly as prescribed and DO NOT stop taking Eliquis? without talking to the doctor who prescribed the medication.  Stopping may increase your risk of developing a new blood clot.  Refill your prescription before you run out. ? ?After discharge, you should have regular check-up appointments with your healthcare provider that is prescribing your Eliquis?. ?   ?What do you do if you miss a dose? ?If a dose of ELIQUIS? is not taken at the scheduled time, take it as soon as possible on the same day and twice-daily administration should be resumed. The dose should not be doubled to make up for a missed dose. ? ?Important Safety Information ?A possible side effect of Eliquis?  is bleeding. You should call your healthcare provider right away if you experience any of the following: ?Bleeding from an injury or your nose that does not stop. ?Unusual colored urine (red or dark brown) or unusual colored stools (red or black). ?Unusual bruising for unknown reasons. ?A serious fall or if you hit your head (even if there is no bleeding). ? ?Some medicines may interact with Eliquis? and might increase your risk of bleeding or clotting while on Eliquis?Marland Kitchen To help avoid this, consult your healthcare provider or pharmacist prior to using any new prescription or non-prescription medications, including herbals, vitamins, non-steroidal anti-inflammatory drugs (NSAIDs) and supplements. ? ?This website has more information on Eliquis? (apixaban): http://www.eliquis.com/eliquis/home   ?================================================================= ? ?Pulmonary Embolism ? ?  ?A pulmonary embolism (PE) is a sudden blockage or decrease of blood flow in one or both lungs. Most blockages come from a blood clot that forms in the vein of a lower leg, thigh, or arm (deep vein thrombosis, DVT) and travels to the lungs. A clot is blood that has thickened into a gel or solid. PE is a dangerous and life-threatening condition that needs to be treated right away. ? ?What are the causes? ?This condition is usually caused by a blood clot that forms in a vein and moves to the lungs. In rare cases, it may be caused by air, fat, part of a tumor, or other tissue that moves through the veins and into the lungs. ? ?What increases the risk? ?The following factors may make you more likely to develop this condition: ?Experiencing a traumatic injury, such as breaking a hip or leg. ?Having: ?A spinal cord injury. ?Orthopedic surgery, especially hip or knee replacement. ?Any major surgery. ?A stroke. ?DVT. ?Blood clots or blood clotting disease. ?Long-term (chronic) lung or heart disease. ?Cancer treated with chemotherapy. ?A central venous catheter. ?Taking medicines that contain estrogen. These include birth control pills and hormone replacement therapy. ?Being: ?Pregnant. ?In the period of time after your baby is delivered (postpartum). ?Older than age 84. ?Overweight. ?A smoker, especially if you have other risks. ? ?What are the signs or symptoms? ?Symptoms of this condition usually start suddenly and include: ?Shortness of breath during activity or at rest. ?Coughing, coughing up blood, or coughing up blood-tinged mucus. ?Chest pain that is often worse with deep breaths. ?Rapid or irregular heartbeat. ?Feeling light-headed or dizzy. ?Fainting. ?Feeling anxious. ?Fever. ?Sweating. ?Pain and swelling in a leg. This is a symptom of DVT, which can lead to PE. ?How is this diagnosed? ?This condition may be diagnosed  based on: ?Your medical history. ?A physical exam. ?Blood tests. ?CT pulmonary angiogram. This test checks blood flow in and around your lungs. ?Ventilation-perfusion scan, also called a lung VQ scan. This test measures air flow and blood flow to the lungs. ?An ultrasound of the legs. ? ?How is this treated? ?Treatment for this condition depends on many factors, such as the cause of your PE, your risk for bleeding or developing more clots, and other medical conditions you have. Treatment aims to remove, dissolve, or stop blood clots from forming or growing larger. Treatment may include: ?Medicines, such as: ?Blood thinning medicines (anticoagulants) to stop clots from forming. ?Medicines that dissolve clots (thrombolytics). ?Procedures, such as: ?Using a flexible tube to remove a blood clot (embolectomy) or to deliver medicine to destroy it (catheter-directed thrombolysis). ?Inserting a filter into a large vein that carries blood to the heart (inferior vena cava). This filter (vena cava filter) catches blood clots before  they reach the lungs. ?Surgery to remove the clot (surgical embolectomy). This is rare. ?You may need a combination of immediate, long-term (up to 3 months after diagnosis), and extended (more than 3 months after diagnosis) treatments. Your treatment may continue for several months (maintenance therapy). You and your health care provider will work together to choose the treatment program that is best for you. ? ?Follow these instructions at home: ?Medicines ?Take over-the-counter and prescription medicines only as told by your health care provider. ?If you are taking an anticoagulant medicine: ?Take the medicine every day at the same time each day. ?Understand what foods and drugs interact with your medicine. ?Understand the side effects of this medicine, including excessive bruising or bleeding. Ask your health care provider or pharmacist about other side effects. ? ?General instructions ?Wear a  medical alert bracelet or carry a medical alert card that says you have had a PE and lists what medicines you take. ?Ask your health care provider when you may return to your normal activities. Avoid sitting or lying for a long

## 2021-05-09 NOTE — Progress Notes (Signed)
Physical Therapy Session Note ? ?Patient Details  ?Name: [name] ?MRN: [id number] ?Date of Birth: Nov 25, 1960 ? ?Today's Date: 05/09/2021 ?PT Individual Time: 0801-0900 and 1400-1500 ?PT Individual Time Calculation (min): 59 min and 60 min ? ?Short Term Goals: ?Week 1:  PT Short Term Goal 1 (Week 1): pt will perform bed mobility with CGA ?PT Short Term Goal 2 (Week 1): Pt will trasnfer to [facility] with min assist consistently ?PT Short Term Goal 3 (Week 1): Pt will ambulate 169ft with min assist and LRAD ?PT Short Term Goal 4 (Week 1): Pt will ascend 8 steps with min assist ? ?Skilled Therapeutic Interventions/Progress Updates:  ?Session 1: ?'Patient supine in bed on entrance to room. Patient alert and agreeable to PT session.  ? ?Patient with no pain complaint throughout session. ? ?Therapeutic Activity: ?Bed Mobility: Patient performed supine --> sit with Min/ modA to bring UB up to seated position on EOB. Is able to scoot forward with ModA to reach BLE to floor. VC/ tc required for forward lean and anterior weight shift. Guided in dressing while seated EOB. Requires up to Mod A for donning pants and shirt d/t R foot bandaging and limitations in RLE mobility as well as pt's IV site in arm  and pulling shirt down once overhead. No balance loss throughout. After toileting, pt set up at sink in w/c in order wash hands and SETUP with washcloth. Provided with soap and pt cleans under fingernails as well.  ?Transfers: Patient performed sit<>stand transfers with ModA for power up and attaining balance using RW as pt demos posterior bian in standing. Stand pivot transfers throughout session with Min/ModA for balance. Squa pivot transfers initiated with pt performing stand pivot instead. Provided verbal cues for forward weight shift as pt demos posterior bias. Toilet transfer from w/c to toilet using safety rail in bathroom to assist with pull-to-stand and transfers to toilet with minA. Requires ModA for clothing mgmt.  Toilets and performs self care with supervision. Additional pericare provided with washcloth and ModA.  ? ?Neuromuscular Re-ed: ?NMR facilitated during session with focus onstanding balance. Pt guided in sit <>stands from EOB and from w/c to RW. Focus on foot positioning to maintain WB precautions and hand positioning to push from seat as pt tends to pull at RW to stand. Requires vc with tactile directioning at ribs for forward lean of trunk to assist with ant weight shift. Advances to Min/ int ModA.  NMR performed for improvements in motor control and coordination, balance, sequencing, judgement, and self confidence/ efficacy in performing all aspects of mobility at highest level of independence.  ? ?Patient seated upright  in w/c at end of session with brakes locked, belt alarm set, and all needs within reach. ? ?Session 2: ?Patient supine in bed on entrance to room. Patient alert and agreeable to PT session. On exiting room, BP checked by NT with pressure at 127/81. ? ?Patient with no pain complaint throughout session. ? ?Therapeutic Activity: ?Bed Mobility: Patient performed supine --> sit with close supervision. VC/ tc required for increasing effort for push up to seated position at EOB. Requires MinA for forward scoot.  ?Transfers: Patient performed sit<>stand and stand pivot transfers throughout session with ModA improving to MinA. Provided vc/ tc for forward lean and forward push with BUE from armrests of w/c. ? ?Gait Training:  ?Patient ambulated 45' x1/ 33' x1 using RW with CGA/ int MinA.  Demonstrated step to gait pattern leading with RLE. Provided vc/ tc for upright posture,  maintaining weight over R heel, level gaze. ? ?Neuromuscular Re-ed: ?NMR facilitated during session with focus on standing balance. Pt guided in sit <>stands from w/c to RW. Focus on foot positioning to maintain WB precautions and hand positioning to push from seat as pt tends to pull at RW to stand. Requires vc with tactile  directioning at back to assist with ant weight shift. Advances to MinA for power up and attaining balance. While standing pt does tend to keep weight over heels but is better able to attain balance with R foot positioned ahead of L foot. NMR performed for improvements in motor control and coordination, balance, sequencing, judgement, and self confidence/ efficacy in performing all aspects of mobility at highest level of independence.  ? ?Patient supine  in bed at end of session with brakes locked, bed alarm set, and all needs within reach. ? ?Therapy Documentation ?Precautions:  ?Precautions ?Precautions: Fall ?Precaution Comments: baseline R HP and cognitive deficits ?Restrictions ?Weight Bearing Restrictions: Yes ?RLE Weight Bearing: Weight bearing as tolerated ?Other Position/Activity Restrictions: heel WB with surgical shoe ?General: ?  ?Vital Signs: ?  ?Pain: ?Pain Assessment ?Pain Scale: 0-10 ?Pain Score: 0-No pain ? ?Therapy/Group: Individual Therapy ? ?Alger Simons PT, DPT ?05/09/2021, 10:17 AM  ?

## 2021-05-09 NOTE — Progress Notes (Signed)
Occupational Therapy Session Note ? ?Patient Details  ?Name: [name] ?MRN: [id number] ?Date of Birth: 1960-06-22 ? ?Today's Date: 05/09/2021 ?OT Individual Time: 6962-9528 ?OT Individual Time Calculation (min): 56 min  ? ? ?Short Term Goals: ?Week 1:  OT Short Term Goal 1 (Week 1): pt will complete >2 standing grooming task with CGA ?OT Short Term Goal 2 (Week 1): Pt will complete ambulatory toilet transfer with min A and LRAD ?OT Short Term Goal 3 (Week 1): Pt will don pants with mod A ?OT Short Term Goal 4 (Week 1): Pt will assist with donning surgical shoe wwith no more than mod A. ? ?Skilled Therapeutic Interventions/Progress Updates:  ?  Patient received up in wheelchair following PT earlier this morning.  Patient agreeable to wash and get changed from current clothing into clean clothing.  Patient able to take off shirt with mod assist - partially due to dressing surrounding IV site.  Patient had difficulty with strength to pull shirt over head to remove.  Patient washed face, chest and arms with tactile cueing as start.  Donning shirt - mod assist to pull over head and pull down back/front.   ?Patient had difficulty boosting up to stand at sink to remove brief.  Patient able to stand with mod assist with walker placed in front of him.  Patient repeatedly reported no pain, just fatigue.  "I am just weak."   ?Patient did well with several brief rest breaks between activities.  Patient stood with mild shaking in legs and tendency to initially breathe heavy, then to hold breath.  Patient did best when offered rest break, as patient did not ask for rest - even when short of breath.   ?Patient flexible enough to reach right foot with one hand, but had difficulty managing sock with two hands, and unable to sustain right leg crossed over left knee.   ? ?Patient left up in wheelchair with safety belt in place and alarm engaged, call bell within reach in lap.   ? ? ?Therapy Documentation ?Precautions:   ?Precautions ?Precautions: Fall ?Precaution Comments: baseline R HP and cognitive deficits ?Restrictions ?Weight Bearing Restrictions: Yes ?RLE Weight Bearing: Weight bearing as tolerated ?Other Position/Activity Restrictions: heel WB with surgical shoe ? ?Pain: ?No pain ?  ? ? ? ?Therapy/Group: Individual Therapy ? ?Mariah Milling ?05/09/2021, 12:32 PM ?

## 2021-05-09 NOTE — Progress Notes (Signed)
Inpatient Rehabilitation Center ?Individual Statement of Services ? ?Patient Name:  [name]  ?Date:  05/09/2021 ? ?Welcome to the [facility].  Our goal is to provide you with an individualized program based on your diagnosis and situation, designed to meet your specific needs.  With this comprehensive rehabilitation program, you will be expected to participate in at least 3 hours of rehabilitation therapies Monday-Friday, with modified therapy programming on the weekends. ? ?Your rehabilitation program will include the following services:  Physical Therapy (PT), Occupational Therapy (OT), Speech Therapy (ST), 24 hour per day rehabilitation nursing, Therapeutic Recreaction (TR), Neuropsychology, Care Coordinator, Rehabilitation Medicine, Nutrition Services, Pharmacy Services, and Other ? ?Weekly team conferences will be held on Wednesdays to discuss your progress.  Your Inpatient Rehabilitation Care Coordinator will talk with you frequently to get your input and to update you on team discussions.  Team conferences with you and your family in attendance may also be held. ? ?Expected length of stay:  16-18 Days  Overall anticipated outcome: Supervision ? ?Depending on your progress and recovery, your program may change. Your Inpatient Rehabilitation Care Coordinator will coordinate services and will keep you informed of any changes. Your Inpatient Rehabilitation Care Coordinator's name and contact numbers are listed  below. ? ?The following services may also be recommended but are not provided by the [facility]:  ? ?Home Health Rehabiltiation Services ?Outpatient Rehabilitation Services ? ?  ?Arrangements will be made to provide these services after discharge if needed.  Arrangements include referral to agencies that provide these services. ? ?Your insurance has been verified to be:   Medicare A & B ?Your primary doctor is:   Delight Stare, MD ? ?Pertinent information will  be shared with your doctor and your insurance company. ? ?Inpatient Rehabilitation Care Coordinator:  Erlene Quan, [place] or ([phone] ? ?Information discussed with and copy given to patient by: Dyanne Iha, 05/09/2021, 10:16 AM    ?

## 2021-05-09 NOTE — Progress Notes (Signed)
Patient ID: [name], male   DOB: Jan 28, 1960, 61 y.o.   MRN: [id number] ? ?Sw made several attempts to reach patient family. Receiving busy signal. Sw will continue to follow up.  ?

## 2021-05-09 NOTE — Progress Notes (Signed)
[facility] PHYSICAL MEDICINE & REHABILITATION PROGRESS NOTE ? ?Subjective/Complaints: ? ? ?No c/o , had BM this am , no foot pain  ?ROS: Denies CP, SOB, N/V/D ? ? ?Objective: ?Vital Signs: ?Blood pressure 134/81, pulse 75, temperature 98.6 ?F (37 ?C), temperature source Oral, resp. rate 14, height 5\' 7"  (1.702 m), weight 71 kg, SpO2 100 %. ?No results found. ?Recent Labs  ?  05/09/21 ?0617  ?WBC 5.7  ?HGB 12.5*  ?HCT 37.8*  ?PLT 244  ? ?Recent Labs  ?  05/09/21 ?0617  ?NA 133*  ?K 3.9  ?CL 100  ?CO2 25  ?GLUCOSE 143*  ?BUN 15  ?CREATININE 0.77  ?CALCIUM 9.0  ? ? ?Intake/Output Summary (Last 24 hours) at 05/09/2021 0814 ?Last data filed at 05/09/2021 1093 ?Gross per 24 hour  ?Intake 240 ml  ?Output 1200 ml  ?Net -960 ml  ? ?  ? ?  ? ?Physical Exam: ?BP 134/81 (BP Location: Left Arm)   Pulse 75   Temp 98.6 ?F (37 ?C) (Oral)   Resp 14   Ht 5\' 7"  (1.702 m)   Wt 71 kg   SpO2 100%   BMI 24.52 kg/m?  ? ?General: No acute distress ?Mood and affect are appropriate ?Heart: Regular rate and rhythm no rubs murmurs or extra sounds ?Lungs: Clear to auscultation, breathing unlabored, no rales or wheezes ?Abdomen: Positive bowel sounds, soft nontender to palpation, nondistended ? ? ?Skin: Warm and dry.  Intact. ?Psych: Normal behavior.  Normal affect. ?Musc:    Right amp incision with dressing CDI, unchanged ?Neuro: 4-/5 throughout,  ?Minimal cogwheeling at elbows but not at wrist, movements bradykinetic, masked facies  ?Hand intrinsic atrophy with reduced sensation in 5th fingers bilateral , also reduced sensation feet ? ?Assessment/Plan: ?1. Functional deficits which require 3+ hours per day of interdisciplinary therapy in a comprehensive inpatient rehab setting. ?Physiatrist is providing close team supervision and 24 hour management of active medical problems listed below. ?Physiatrist and rehab team continue to assess barriers to discharge/monitor patient progress toward functional and medical goals ? ? ?Care  Tool: ? ?Bathing ?   ?Body parts bathed by patient: Right arm, Left arm, Face, Chest, Abdomen, Front perineal area  ?   ?  ?  ?Bathing assist Assist Level: Supervision/Verbal cueing ?  ?  ?Upper Body Dressing/Undressing ?Upper body dressing   ?What is the patient wearing?: [place] only ?   ?Upper body assist Assist Level: Maximal Assistance - Patient 25 - 49% ?   ?Lower Body Dressing/Undressing ?Lower body dressing ? ? ?   ?What is the patient wearing?: Pants, Underwear/pull up ? ?  ? ?Lower body assist Assist for lower body dressing: Maximal Assistance - Patient 25 - 49% ?   ? ?Toileting ?Toileting Toileting Activity did not occur (Probation officer and hygiene only): N/A (no void or bm)  ?Toileting assist Assist for toileting: Maximal Assistance - Patient 25 - 49% ?  ?  ?Transfers ?Chair/bed transfer ? ?Transfers assist ?   ? ?Chair/bed transfer assist level: Moderate Assistance - Patient 50 - 74% ?  ?  ?Locomotion ?Ambulation ? ? ?Ambulation assist ? ?   ? ?Assist level: Moderate Assistance - Patient 50 - 74% ?Assistive device: Hand held assist ?Max distance: 10  ? ?Walk 10 feet activity ? ? ?Assist ?   ? ?Assist level: Moderate Assistance - Patient - 50 - 74% ?Assistive device: Hand held assist  ? ?Walk 50 feet activity ? ? ?Assist Walk 50 feet with  2 turns activity did not occur: Safety/medical concerns ? ?  ?   ? ? ?Walk 150 feet activity ? ? ?Assist Walk 150 feet activity did not occur: Safety/medical concerns ? ?  ?  ?  ? ?Walk 10 feet on uneven surface  ?activity ? ? ?Assist Walk 10 feet on uneven surfaces activity did not occur: Safety/medical concerns ? ? ?  ?   ? ?Wheelchair ? ? ? ? ?Assist   ?  ?  ? ?Wheelchair assist level: Minimal Assistance - Patient > 75% ?Max wheelchair distance: 150  ? ? ?Wheelchair 50 feet with 2 turns activity ? ? ? ?Assist ? ?  ?  ? ? ?Assist Level: Minimal Assistance - Patient > 75%  ? ?Wheelchair 150 feet activity  ? ? ? ?Assist ?   ? ? ?Assist Level: Minimal  Assistance - Patient > 75%  ? ? ?Medical Problem List and Plan: ?1. Functional deficits secondary to peripheral arterial disease, RIght Great toe amp due to osteomyelitis with pop art stenting Right side surgery and acute pulmonary embolus. ?Continue CIR PT, OT  ?2.  Antithrombotics: ?-DVT/anticoagulation:  Pharmaceutical: Other (comment) Eliquis ?            -antiplatelet therapy: Aspirin 81 mg daily ?3. Pain Management: Tylenol, tramadol as needed ? Monitor with increased exertion ?4. Onchomycosis of left toes: antifungal cream ordered BID.  ?5. Neuropsych: This patient is capable of making decisions on his own behalf. ?6. Right foot incision: ?            --Daily dry dressing change ?7. Fluids/Electrolytes/Nutrition: Routine I's and O's ?            -- Carb modified diet ?8: PAD: Status post popliteal stent placement ?            -- Continue aspirin while on Eliquis.  Stop Plavix. ?            -- Follow-up with Dr. Lucky Cowboy with ABIs ?            -- Status post right partial first ray amputation  ?            -- Right heel weightbearing with postop shoe ? ?9: prior CVA: ?10: Dyslipidemia: Continue Lipitor 40 mg daily ?11: Acute PE: Continue Eliquis ?12: Hypertension: Continue Norvasc 10 mg daily ? Controlled on 4/16 ? Monitor with increased activity ?13: DM type II with peripheral neuropathy  Restarted metformin 250mg  daily.  ? Monitor increase mobility ?14: Seizure disorder maintained on Keppra: Continue 500 mg daily ?15: Constipation: give sorbitol times once dose now ?            --schedule senna BID ?16: Urinary retention: d/c foley ?            -- Continue Flomax ? Improved ?17: Chronic diastolic congestive heart failure, euvolemic, no diuretics ?            -- Grade 1 diastolic dysfunction on recent echo ?18.  Hyponatremia ? Sodium 130 on 4/12, labs ordered for tomorrow ?19.  Bradykinesia , masked facies, min cogwheeling, no hx PD may need neuro evalon this ?Hx of remote RIght subcortical infarcts , seizure  d/o ?LOS: 3 days ?A FACE TO FACE EVALUATION WAS PERFORMED ? ?Luanna Salk Khari Lett ?05/09/2021, 8:14 AM ? ?

## 2021-05-10 MED ORDER — BETHANECHOL CHLORIDE 10 MG PO TABS
10.0000 mg | ORAL_TABLET | Freq: Three times a day (TID) | ORAL | Status: DC
  Administered 2021-05-10 – 2021-06-03 (×73): 10 mg via ORAL
  Filled 2021-05-10 (×74): qty 1

## 2021-05-10 NOTE — IPOC Note (Signed)
Overall Plan of Care (IPOC) ?Patient Details ?Name: [name] ?MRN: [id number] ?DOB: 30-Aug-1960 ? ?Admitting Diagnosis: Pulmonary embolism ([place]) ? ?Hospital Problems: Principal Problem: ?  Pulmonary embolism ([facility]) ?Active Problems: ?  Essential hypertension ?  Acute pulmonary embolism ([place]) ?  Controlled type 2 diabetes mellitus with hyperglycemia, without long-term current use of insulin ([place]) ? ? ? ? Functional Problem List: ?Nursing Bladder, Endurance, Pain, Skin Integrity, Bowel, Safety, Medication Management  ?PT Balance, Behavior, Edema, Endurance, Motor, Safety, Sensory, Skin Integrity  ?OT Balance, Endurance, Motor, Cognition  ?SLP    ?TR    ?    ? Basic ADL?s: ?OT Grooming, Bathing, Dressing, Toileting  ? ?  Advanced  ADL?s: ?OT    ?   ?Transfers: ?PT Bed Mobility, Bed to Chair, Car, Furniture, Floor  ?OT Toilet, Tub/Shower  ? ?  Locomotion: ?PT Ambulation, Wheelchair Mobility, Stairs  ? ?  Additional Impairments: ?OT None  ?SLP   ?  ?   ?TR    ? ? ?Anticipated Outcomes ?Item Anticipated Outcome  ?Self Feeding ind  ?Swallowing ?   ?  ?Basic self-care ? S to min A  ?Toileting ? S ?  ?Bathroom Transfers S  ?Bowel/Bladder ? Manage bowel w mod I and bladder w min assist  ?Transfers ? supervision assist with LRAD  ?Locomotion ? ambulatory with RW. supervision assist  ?Communication ?    ?Cognition ?    ?Pain ? pain at or below level 4 w prns  ?Safety/Judgment ? maintain safety w cues  ? ?Therapy Plan: ?PT Intensity: Minimum of 1-2 x/day ,45 to 90 minutes ?PT Frequency: 5 out of 7 days ?PT Duration Estimated Length of Stay: 12-16days ?OT Intensity: Minimum of 1-2 x/day, 45 to 90 minutes ?OT Frequency: 5 out of 7 days ?OT Duration/Estimated Length of Stay: 2-2.5 weeks ?   ? ?Due to the current state of emergency, patients may not be receiving their 3-hours of Medicare-mandated therapy. ? ? Team Interventions: ?Nursing Interventions Bladder Management, Disease Management/Prevention, Medication Management,  Discharge Planning, Pain Management, Bowel Management, Patient/Family Education  ?PT interventions Ambulation/gait training, Discharge planning, Functional mobility training, Psychosocial support, Therapeutic Activities, Visual/perceptual remediation/compensation, Balance/vestibular training, Disease management/prevention, Neuromuscular re-education, Skin care/wound management, Wheelchair propulsion/positioning, Therapeutic Exercise, Cognitive remediation/compensation, DME/adaptive equipment instruction, Pain management, Splinting/orthotics, UE/LE Strength taining/ROM, Community reintegration, Technical sales engineer stimulation, Patient/family education, Stair training, UE/LE Coordination activities  ?OT Interventions Balance/vestibular training, Disease mangement/prevention, Neuromuscular re-education, Self Care/advanced ADL retraining, Therapeutic Exercise, Wheelchair propulsion/positioning, Cognitive remediation/compensation, DME/adaptive equipment instruction, Pain management, UE/LE Strength taining/ROM, UE/LE Coordination activities, Patient/family education, Community reintegration, Discharge planning, Functional mobility training, Psychosocial support, Therapeutic Activities  ?SLP Interventions    ?TR Interventions    ?SW/CM Interventions Discharge Planning, Psychosocial Support, Patient/Family Education, Disease Management/Prevention  ? ?Barriers to Discharge ?MD  Medical stability, Wound care, and Weight bearing restrictions  ?Nursing Home environment access/layout, Decreased caregiver support ?1 level apt flight up to/bil rails with brother  ?PT Inaccessible home environment, Decreased caregiver support, Home environment access/layout, Lack of/limited family support ?   ?OT Inaccessible home environment, Home environment access/layout ?   ?SLP   ?   ?SW Decreased caregiver support, Lack of/limited family support, Neurogenic Bowel & Bladder ?   ? ?Team Discharge Planning: ?Destination: PT-Home ,OT- Home ,  SLP-  ?Projected Follow-up: PT-Home health PT, OT-  Home health OT, 24 hour supervision/assistance, SLP-  ?Projected Equipment Needs: PT-To be determined, OT- To be determined, SLP-  ?Equipment Details: PT- , OT-  ?Patient/family involved  in discharge planning: PT- Patient,  OT-Patient, SLP-  ? ?MD ELOS: 10-14d ?Medical Rehab Prognosis:  Good ?Assessment: The patient has been admitted for CIR therapies with the diagnosis of RIght first ray amp. The team will be addressing functional mobility, strength, stamina, balance, safety, adaptive techniques and equipment, self-care, bowel and bladder mgt, patient and caregiver education, wound care, NWB RLE. Goals have been set at Sup. Anticipated discharge destination is Home. ? ?Due to the current state of emergency, patients may not be receiving their 3 hours per day of Medicare-mandated therapy.  ?  ? ? ?See Team Conference Notes for weekly updates to the plan of care  ?

## 2021-05-10 NOTE — Progress Notes (Signed)
[facility] PHYSICAL MEDICINE & REHABILITATION PROGRESS NOTE ? ?Subjective/Complaints: ?No c/os overnite , no RIght foot pain  ?ROS: Denies CP, SOB, N/V/D ? ? ?Objective: ?Vital Signs: ?Blood pressure 126/80, pulse 72, temperature 98.6 ?F (37 ?C), temperature source Oral, resp. rate 16, height 5\' 7"  (1.702 m), weight 71 kg, SpO2 100 %. ?No results found. ?Recent Labs  ?  05/09/21 ?0617  ?WBC 5.7  ?HGB 12.5*  ?HCT 37.8*  ?PLT 244  ? ? ?Recent Labs  ?  05/09/21 ?0617  ?NA 133*  ?K 3.9  ?CL 100  ?CO2 25  ?GLUCOSE 143*  ?BUN 15  ?CREATININE 0.77  ?CALCIUM 9.0  ? ? ? ?Intake/Output Summary (Last 24 hours) at 05/10/2021 0756 ?Last data filed at 05/09/2021 1759 ?Gross per 24 hour  ?Intake 280 ml  ?Output 500 ml  ?Net -220 ml  ? ?  ? ?  ? ?Physical Exam: ?BP 126/80 (BP Location: Left Arm)   Pulse 72   Temp 98.6 ?F (37 ?C) (Oral)   Resp 16   Ht 5\' 7"  (1.702 m)   Wt 71 kg   SpO2 100%   BMI 24.52 kg/m?  ? ?General: No acute distress ?Mood and affect are appropriate ?Heart: Regular rate and rhythm no rubs murmurs or extra sounds ?Lungs: Clear to auscultation, breathing unlabored, no rales or wheezes ?Abdomen: Positive bowel sounds, soft nontender to palpation, nondistended ? ? ?Skin: Warm and dry.  Intact. ?Psych: Normal behavior.  Normal affect. ?Musc:    Right amp incision with dressing CDI, unchanged ?Neuro: 4-/5 throughout,  ?Minimal cogwheeling at elbows but not at wrist, movements bradykinetic, masked facies  ?Hand intrinsic atrophy with reduced sensation in 5th fingers bilateral , also reduced sensation feet ? ?Assessment/Plan: ?1. Functional deficits which require 3+ hours per day of interdisciplinary therapy in a comprehensive inpatient rehab setting. ?Physiatrist is providing close team supervision and 24 hour management of active medical problems listed below. ?Physiatrist and rehab team continue to assess barriers to discharge/monitor patient progress toward functional and medical goals ? ? ?Care  Tool: ? ?Bathing ?   ?Body parts bathed by patient: Right arm, Left arm, Chest, Abdomen, Front perineal area, Right upper leg, Left upper leg, Face  ? Body parts bathed by helper: Right lower leg, Left lower leg, Buttocks ?  ?  ?Bathing assist Assist Level: Moderate Assistance - Patient 50 - 74% (standing for washing buttocks) ?  ?  ?Upper Body Dressing/Undressing ?Upper body dressing   ?What is the patient wearing?: Pull over shirt ?   ?Upper body assist Assist Level: Moderate Assistance - Patient 50 - 74% ?   ?Lower Body Dressing/Undressing ?Lower body dressing ? ? ?   ?What is the patient wearing?: Incontinence brief, Pants ? ?  ? ?Lower body assist Assist for lower body dressing: Maximal Assistance - Patient 25 - 49% ?   ? ?Toileting ?Toileting Toileting Activity did not occur (Probation officer and hygiene only): N/A (no void or bm)  ?Toileting assist Assist for toileting: Maximal Assistance - Patient 25 - 49% ?  ?  ?Transfers ?Chair/bed transfer ? ?Transfers assist ?   ? ?Chair/bed transfer assist level: Moderate Assistance - Patient 50 - 74% ?  ?  ?Locomotion ?Ambulation ? ? ?Ambulation assist ? ?   ? ?Assist level: Moderate Assistance - Patient 50 - 74% ?Assistive device: Hand held assist ?Max distance: 10  ? ?Walk 10 feet activity ? ? ?Assist ?   ? ?Assist level: Moderate Assistance - Patient -  50 - 74% ?Assistive device: Hand held assist  ? ?Walk 50 feet activity ? ? ?Assist Walk 50 feet with 2 turns activity did not occur: Safety/medical concerns ? ?  ?   ? ? ?Walk 150 feet activity ? ? ?Assist Walk 150 feet activity did not occur: Safety/medical concerns ? ?  ?  ?  ? ?Walk 10 feet on uneven surface  ?activity ? ? ?Assist Walk 10 feet on uneven surfaces activity did not occur: Safety/medical concerns ? ? ?  ?   ? ?Wheelchair ? ? ? ? ?Assist Is the patient using a wheelchair?: Yes ?Type of Wheelchair: Manual ?  ? ?Wheelchair assist level: Minimal Assistance - Patient > 75% ?Max wheelchair distance: 150   ? ? ?Wheelchair 50 feet with 2 turns activity ? ? ? ?Assist ? ?  ?  ? ? ?Assist Level: Minimal Assistance - Patient > 75%  ? ?Wheelchair 150 feet activity  ? ? ? ?Assist ?   ? ? ?Assist Level: Minimal Assistance - Patient > 75%  ? ? ?Medical Problem List and Plan: ?1. Functional deficits secondary to peripheral arterial disease, RIght 1st ray, 2,3,4 toe amps due to osteomyelitis with pop art stenting Right side surgery and acute pulmonary embolus. ?Continue CIR PT, OT  ?2.  Antithrombotics: ?-DVT/anticoagulation:  Pharmaceutical: Other (comment) Eliquis ?            -antiplatelet therapy: Aspirin 81 mg daily ?3. Pain Management: Tylenol, tramadol as needed ? Monitor with increased exertion ?4. Onchomycosis of left toes: antifungal cream ordered BID.  ?5. Neuropsych: This patient is capable of making decisions on his own behalf. ?6. Right foot incision: ?            --Daily dry dressing change ?7. Fluids/Electrolytes/Nutrition: Routine I's and O's ?            -- Carb modified diet ?8: PAD: Status post Right popliteal stent placement ?            -- Continue aspirin while on Eliquis.  Stop Plavix. ?            -- Follow-up with Dr. Lucky Cowboy with ABIs ?            -- Status post right partial first ray amputation  ?            -- Right heel weightbearing with postop shoe ? ?9: prior CVA: ?10: Dyslipidemia: Continue Lipitor 40 mg daily ?11: Acute PE: Continue Eliquis ?12: Hypertension: Continue Norvasc 10 mg daily ? Controlled on 4/16 ? Monitor with increased activity ?13: DM type II with peripheral neuropathy  Restarted metformin 250mg  daily.  ? Monitor increase mobility ?14: Seizure disorder maintained on Keppra: Continue 500 mg daily, ? If PCP managed this in past  ?15: Constipation: give sorbitol times once dose now ?            --schedule senna BID ?16: Urinary retention: d/c foley ?            -- Continue Flomax ? Improved ?17: Chronic diastolic congestive heart failure, euvolemic, no diuretics ?            -- Grade  1 diastolic dysfunction on recent echo ?18.  Hyponatremia ? Sodium 130 on 4/12, labs ordered for tomorrow ?19.  Bradykinesia , masked facies, min cogwheeling, no hx PD may need outpt neuro eval on this, no showed for appt with  Dr Jaynee Eagles  ?Hx of remote RIght subcortical infarcts ,  seizure d/o ?LOS: 4 days ?A FACE TO FACE EVALUATION WAS PERFORMED ? ?Luanna Salk Cambryn Charters ?05/10/2021, 7:56 AM ? ?

## 2021-05-10 NOTE — Progress Notes (Signed)
Occupational Therapy Session Note ? ?Patient Details  ?Name: [name] ?MRN: [id number] ?Date of Birth: [birth year] ? ?Today's Date: 05/10/2021 ?OT Individual Time: 0930-1030 ?OT Individual Time Calculation (min): 60 min  ? ? ?Short Term Goals: ?Week 1:  OT Short Term Goal 1 (Week 1): pt will complete >2 standing grooming task with CGA ?OT Short Term Goal 2 (Week 1): Pt will complete ambulatory toilet transfer with min A and LRAD ?OT Short Term Goal 3 (Week 1): Pt will don pants with mod A ?OT Short Term Goal 4 (Week 1): Pt will assist with donning surgical shoe wwith no more than mod A. ? ?Skilled Therapeutic Interventions/Progress Updates:  ?  Pt received in recliner ready for therapy. ?Pt stated he washed up earlier and had clean clothing on. ? ?Pt agreeable to working on sit to stands.  From recliner, his initially stand was a heavy mod A due to limited trunk flexion and forward wt shift.  Once in standing he had a heavy posterior bias as he kept his hips flexed. ? ?Broke down each element of sit to stand to work on techniques of positioning, forward trunk flexion, ant wt shift with rising to stand and standing with RW.  Able to progress to min A to stand from recliner.  Then used RW to sit in arm chair with mod A to ambulate 5 feet to chair.  ?Pt tolerating therapy well. HR 89, O2 sats 98%. ? ?From arm chair - sit to squat push ups 5 x 3 with cues for forward lean. Progressed to 3 push ups and then rising to stand to RW with min A.   In standing, cues for bringing hips forward, chest upright.  He worked on alternating arm reaches. Returned to sit with cues for hand placement to bring both hands back to wc. Pt then stood again with min A using good technique. Cues to fully stand upright,  he was then able to ambulate to EOB with min A.  Supervision to move to supine in bed.  Pt in bed with all needs met and alarm on.  ? ?Therapy Documentation ?Precautions:  ?Precautions ?Precautions: Fall ?Precaution Comments:  baseline R HP and cognitive deficits ?Restrictions ?Weight Bearing Restrictions: Yes ?RLE Weight Bearing: Weight bearing as tolerated ?Other Position/Activity Restrictions: heel WB with surgical shoe ? ?  ?Vital Signs: ?Therapy Vitals ?Temp: 98.6 ?F (37 ?C) ?Temp Source: Oral ?Pulse Rate: 72 ?Resp: 16 ?BP: 126/80 ?Patient Position (if appropriate): Lying ?Oxygen Therapy ?SpO2: 100 % ?O2 Device: Room Air ?Pain: ?  ?ADL: ?ADL ?Eating: Modified independent ?Where Assessed-Eating: Bed level ?Grooming: Supervision/safety ?Where Assessed-Grooming: Sitting at sink ?Upper Body Bathing: Supervision/safety ?Where Assessed-Upper Body Bathing: Sitting at sink ?Lower Body Bathing: Moderate assistance ?Where Assessed-Lower Body Bathing: Sitting at sink ?Upper Body Dressing: Maximal assistance ?Where Assessed-Upper Body Dressing: Sitting at sink ?Lower Body Dressing: Maximal assistance ?Where Assessed-Lower Body Dressing: Standing at sink ?Toileting: Not assessed ?Toilet Transfer Method: Not assessed ?Tub/Shower Transfer: Not assessed ?Walk-In Shower Transfer: Not assessed ? ? ? ?Therapy/Group: Individual Therapy ? ?[place] ?05/10/2021, 8:31 AM ?

## 2021-05-10 NOTE — Progress Notes (Signed)
Physical Therapy Session Note ? ?Patient Details  ?Name: [name] ?MRN: [id number] ?Date of Birth: 1960-12-12 ? ?Today's Date: 05/10/2021 ?PT Individual Time: 8469-6295 ?PT Individual Time Calculation (min): 61 min  ? ?Short Term Goals: ?Week 1:  PT Short Term Goal 1 (Week 1): pt will perform bed mobility with CGA ?PT Short Term Goal 2 (Week 1): Pt will trasnfer to [facility] with min assist consistently ?PT Short Term Goal 3 (Week 1): Pt will ambulate 136ft with min assist and LRAD ?PT Short Term Goal 4 (Week 1): Pt will ascend 8 steps with min assist ? ?Skilled Therapeutic Interventions/Progress Updates:  ?Patient supine in bed on entrance to room. Patient alert and agreeable to PT session. Agreeable to change from scrub pants to personal pants for improved warmth in order to go outside for part of session.  ? ?Patient with no pain complaint throughout session. ? ?Therapeutic Activity: ?Bed Mobility: Patient performed supine --> sit requriing vc/ tc to push up with BUE from sidelying on R side to reach upright seated position on EOB with Min/ ModA. Pt is able to perform forward scooting with supervision. Pt requires ModA to doff scrub pants ?Transfers: Patient performed sit<>stand and stand pivot transfers at start of session to complete LB dressing and to transfer EOB to RW and RW to w/c all with ModA for power up and attaining balance. Throughout session pt improves to light MinA to find balance with push from seat. With pull-to-stand at safety rail or handrails at stairs, pt able to stand with CGA. Provided verbal cues for technique throughout session, especially for forward lean and hand positioning to rise and descend with control.  ? ?Gait Training:  ?Patient taken outside for improved mood and enhance spirits/ frame of mind as well as to initiate ambulation over uneven surfaces. Pt able to cover differing mild pitches over 120 ft using RW with CGA.  Demonstrated good ability to adjust step height and control of  speed with changes in pitch. Provided vc/ tc for adjustments needed to maintain balance. Able to ambulate and manage RW over threshold from outdoor to indoor.  ? ?Pt guided in stair training with physical demonstration and verbal instructions provided prior to performance. Pt is able to complete eight 6" steps using BHR with CGA then MinA on second bout to ascend leading with LLE and Min/ModA to descend leading with RLE and knee guard to LLE. VC provided at initiation of each direction for leading LE.   ? ?Neuromuscular Re-ed: ?NMR facilitated during session with focus on attaining balance. Following pull-to-stand performed with stair training, pt improves in ability to lean forward and push forward with split hand positioning and push from seat. Is able to produce strength required for power up and control to seat by end of blocked practice. NMR performed for improvements in motor control and coordination, balance, sequencing, judgement, and self confidence/ efficacy in performing all aspects of mobility at highest level of independence.  ? ?Patient seated upright  in w/c at end of session with brakes locked, belt alarm set, and all needs within reach. Tray table positioned for lunch arrival. Pt oriented to time and time of next therapy session.  ? ? ?Therapy Documentation ?Precautions:  ?Precautions ?Precautions: Fall ?Precaution Comments: baseline R HP and cognitive deficits ?Restrictions ?Weight Bearing Restrictions: Yes ?RLE Weight Bearing: Weight bearing as tolerated ?Other Position/Activity Restrictions: heel WB with surgical shoe ?General: ?  ?Vital Signs: ?Therapy Vitals ?Temp: 97.8 ?F (36.6 ?C) ?Temp Source: Oral ?Pulse  Rate: 82 ?Resp: 17 ?BP: 138/77 ?Patient Position (if appropriate): Sitting ?Oxygen Therapy ?SpO2: 100 % ?O2 Device: Room Air ?Pain: ? No pain complaint this session.  ? ?Therapy/Group: Individual Therapy ? ?Alger Simons PT, DPT ?05/10/2021, 5:45 PM  ?

## 2021-05-10 NOTE — Progress Notes (Signed)
Physical Therapy Session Note ? ?Patient Details  ?Name: [name] ?MRN: [id number] ?Date of Birth: 1960-06-09 ? ?Today's Date: 05/10/2021 ?PT Individual Time: 6213-0865 ?PT Individual Time Calculation (min): 40 min  ? ?Short Term Goals: ?Week 1:  PT Short Term Goal 1 (Week 1): pt will perform bed mobility with CGA ?PT Short Term Goal 2 (Week 1): Pt will trasnfer to [facility] with min assist consistently ?PT Short Term Goal 3 (Week 1): Pt will ambulate 114ft with min assist and LRAD ?PT Short Term Goal 4 (Week 1): Pt will ascend 8 steps with min assist ? ?Skilled Therapeutic Interventions/Progress Updates:  ?  Pt received seated in bed, agreeable to PT session. No complaints of pain. Nursing in room to perform bladder scan and wanting pt to attempt to void urine. Pt agreeable to transfer to bathroom to attempt toileting. Supine to sit with CGA for some trunk control, HOB elevated. Assisted pt with donning L tennis shoe and R post-op shoe. Sit to stand with mod A to RW due to posterior bias. Ambulatory transfer into bathroom to elevated BSC over toilet with RW and mod A for balance. Pt is dependent for clothing management but unable to void any urine while seated on BSC. Ambulatory transfer to recliner in room with RW and mod A. Assisted pt with donning pants. Pt left seated in recliner in room with needs in reach, quick release belt and chair alarm in place. ? ?Therapy Documentation ?Precautions:  ?Precautions ?Precautions: Fall ?Precaution Comments: baseline R HP and cognitive deficits ?Restrictions ?Weight Bearing Restrictions: Yes ?RLE Weight Bearing: Weight bearing as tolerated ?Other Position/Activity Restrictions: heel WB with surgical shoe ? ? ? ? ? ? ?Therapy/Group: Individual Therapy ? ? ?Excell Seltzer, PT, DPT, CS[facility] ?05/10/2021, 9:19 AM  ?

## 2021-05-10 NOTE — Progress Notes (Signed)
Occupational Therapy Session Note ? ?Patient Details  ?Name: [name] ?MRN: [id number] ?Date of Birth: 02/05/1960 ? ?Today's Date: 05/10/2021 ?OT Individual Time: 1402-1430 ?OT Individual Time Calculation (min): 28 min  ? ? ?Short Term Goals: ?Week 1:  OT Short Term Goal 1 (Week 1): pt will complete >2 standing grooming task with CGA ?OT Short Term Goal 2 (Week 1): Pt will complete ambulatory toilet transfer with min A and LRAD ?OT Short Term Goal 3 (Week 1): Pt will don pants with mod A ?OT Short Term Goal 4 (Week 1): Pt will assist with donning surgical shoe wwith no more than mod A. ? ?Skilled Therapeutic Interventions/Progress Updates:  ?   ?Pt received in w/c with no pain and agreeable to tx. Pt requesting to work on standing ? ?Therapeutic activity ?Focus on standing tolerance with WB only through heel (kick stand out into dependent position during sit<>stand) with MOD A for lifting/lowering from w/c with RW and CGA for standing balance in trials up to 8 min while playing checkers. Pt tolerated well with no increase in pain. Returned to room wit MINA SPT with use of bed rail to pull to stand. ? ?Pt left at end of session in bed with exit alarm on, call light in reach and all needs met ? ? ?Therapy Documentation ?Precautions:  ?Precautions ?Precautions: Fall ?Precaution Comments: baseline R HP and cognitive deficits ?Restrictions ?Weight Bearing Restrictions: Yes ?RLE Weight Bearing: Weight bearing as tolerated ?Other Position/Activity Restrictions: heel WB with surgical shoe ?General: ?  ?Vital Signs: ? ?Pain: ?  ?ADL: ?ADL ?Eating: Modified independent ?Where Assessed-Eating: Bed level ?Grooming: Supervision/safety ?Where Assessed-Grooming: Sitting at sink ?Upper Body Bathing: Supervision/safety ?Where Assessed-Upper Body Bathing: Sitting at sink ?Lower Body Bathing: Moderate assistance ?Where Assessed-Lower Body Bathing: Sitting at sink ?Upper Body Dressing: Maximal assistance ?Where Assessed-Upper Body  Dressing: Sitting at sink ?Lower Body Dressing: Maximal assistance ?Where Assessed-Lower Body Dressing: Standing at sink ?Toileting: Not assessed ?Toilet Transfer Method: Not assessed ?Tub/Shower Transfer: Not assessed ?Walk-In Shower Transfer: Not assessed ?Vision ?  ?Perception  ?  ?Praxis ?  ?Balance ?  ?Exercises: ?  ?Other Treatments:   ? ? ?Therapy/Group: Individual Therapy ? ? M  ?05/10/2021, 6:49 AM ?

## 2021-05-11 DIAGNOSIS — Z89412 Acquired absence of left great toe: Secondary | ICD-10-CM

## 2021-05-11 LAB — GLUCOSE, CAPILLARY: Glucose-Capillary: 183 mg/dL — ABNORMAL HIGH (ref 70–99)

## 2021-05-11 NOTE — Progress Notes (Signed)
[place] PHYSICAL MEDICINE & REHABILITATION PROGRESS NOTE ? ?Subjective/Complaints: ? ?Does not recall BMs for last 2 d ?Oriented to hospital but not situation .  "Blood sugar " problems, did not mention foot surgery  ?ROS: Denies CP, SOB, N/V/D ? ? ?Objective: ?Vital Signs: ?Blood pressure 127/76, pulse 71, temperature 98.8 ?F (37.1 ?C), temperature source Oral, resp. rate 18, height '5\' 7"'  (1.702 m), weight 71 kg, SpO2 100 %. ?No results found. ?Recent Labs  ?  05/09/21 ?0617  ?WBC 5.7  ?HGB 12.5*  ?HCT 37.8*  ?PLT 244  ? ? ?Recent Labs  ?  05/09/21 ?0617  ?NA 133*  ?K 3.9  ?CL 100  ?CO2 25  ?GLUCOSE 143*  ?BUN 15  ?CREATININE 0.77  ?CALCIUM 9.0  ? ? ? ?Intake/Output Summary (Last 24 hours) at 05/11/2021 0826 ?Last data filed at 05/11/2021 0700 ?Gross per 24 hour  ?Intake 236 ml  ?Output 1645 ml  ?Net -1409 ml  ? ?  ? ?  ? ?Physical Exam: ?BP 127/76 (BP Location: Right Arm)   Pulse 71   Temp 98.8 ?F (37.1 ?C) (Oral)   Resp 18   Ht '5\' 7"'  (1.702 m)   Wt 71 kg   SpO2 100%   BMI 24.52 kg/m?  ? ?General: No acute distress ?Mood and affect are appropriate ?Heart: Regular rate and rhythm no rubs murmurs or extra sounds ?Lungs: Clear to auscultation, breathing unlabored, no rales or wheezes ?Abdomen: Positive bowel sounds, soft nontender to palpation, nondistended ? ? ?Skin: Warm and dry.  Intact. ?Psych: Normal behavior.  Normal affect. ?Musc:    Right amp incision with dressing CDI, unchanged ?Neuro: 4-/5 throughout,  ?Minimal cogwheeling at elbows but not at wrist, movements bradykinetic, masked facies  ?Hand intrinsic atrophy with reduced sensation in 5th fingers bilateral , also reduced sensation feet ? ?Assessment/Plan: ?1. Functional deficits which require 3+ hours per day of interdisciplinary therapy in a comprehensive inpatient rehab setting. ?Physiatrist is providing close team supervision and 24 hour management of active medical problems listed below. ?Physiatrist and rehab team continue to assess  barriers to discharge/monitor patient progress toward functional and medical goals ? ? ?Care Tool: ? ?Bathing ?   ?Body parts bathed by patient: Right arm, Left arm, Chest, Abdomen, Front perineal area, Right upper leg, Left upper leg, Face  ? Body parts bathed by helper: Right lower leg, Left lower leg, Buttocks ?  ?  ?Bathing assist Assist Level: Moderate Assistance - Patient 50 - 74% (standing for washing buttocks) ?  ?  ?Upper Body Dressing/Undressing ?Upper body dressing   ?What is the patient wearing?: Pull over shirt ?   ?Upper body assist Assist Level: Moderate Assistance - Patient 50 - 74% ?   ?Lower Body Dressing/Undressing ?Lower body dressing ? ? ?   ?What is the patient wearing?: Incontinence brief, Pants ? ?  ? ?Lower body assist Assist for lower body dressing: Maximal Assistance - Patient 25 - 49% ?   ? ?Toileting ?Toileting Toileting Activity did not occur (Probation officer and hygiene only): N/A (no void or bm)  ?Toileting assist Assist for toileting: Maximal Assistance - Patient 25 - 49% ?  ?  ?Transfers ?Chair/bed transfer ? ?Transfers assist ?   ? ?Chair/bed transfer assist level: Moderate Assistance - Patient 50 - 74% ?  ?  ?Locomotion ?Ambulation ? ? ?Ambulation assist ? ?   ? ?Assist level: Moderate Assistance - Patient 50 - 74% ?Assistive device: Hand held assist ?Max distance: 10  ? ?  Walk 10 feet activity ? ? ?Assist ?   ? ?Assist level: Moderate Assistance - Patient - 50 - 74% ?Assistive device: Hand held assist  ? ?Walk 50 feet activity ? ? ?Assist Walk 50 feet with 2 turns activity did not occur: Safety/medical concerns ? ?  ?   ? ? ?Walk 150 feet activity ? ? ?Assist Walk 150 feet activity did not occur: Safety/medical concerns ? ?  ?  ?  ? ?Walk 10 feet on uneven surface  ?activity ? ? ?Assist Walk 10 feet on uneven surfaces activity did not occur: Safety/medical concerns ? ? ?  ?   ? ?Wheelchair ? ? ? ? ?Assist Is the patient using a wheelchair?: Yes ?Type of Wheelchair: Manual ?   ? ?Wheelchair assist level: Minimal Assistance - Patient > 75% ?Max wheelchair distance: 150  ? ? ?Wheelchair 50 feet with 2 turns activity ? ? ? ?Assist ? ?  ?  ? ? ?Assist Level: Minimal Assistance - Patient > 75%  ? ?Wheelchair 150 feet activity  ? ? ? ?Assist ?   ? ? ?Assist Level: Minimal Assistance - Patient > 75%  ? ? ?Medical Problem List and Plan: ?1. Functional deficits secondary to peripheral arterial disease, RIght 1st ray, 2,3,4 toe amps due to osteomyelitis with pop art stenting Right side surgery and acute pulmonary embolus. ?Continue CIR PT, OT - Team conference today please see physician documentation under team conference tab, met with team  to discuss problems,progress, and goals. Formulized individual treatment plan based on medical history, underlying problem and comorbidities. ? ?2.  Antithrombotics: ?-DVT/anticoagulation:  Pharmaceutical: Other (comment) Eliquis ?            -antiplatelet therapy: Aspirin 81 mg daily ?3. Pain Management: Tylenol, tramadol as needed ? Monitor with increased exertion ?4. Onchomycosis of left toes: antifungal cream ordered BID.  ?5. Neuropsych: This patient is capable of making decisions on his own behalf. ?6. Right foot incision: ?            --Daily dry dressing change ?7. Fluids/Electrolytes/Nutrition: Routine I's and O's ?            -- Carb modified diet ?8: PAD: Status post Right popliteal stent placement ?            -- Continue aspirin while on Eliquis.  Stop Plavix. ?            -- Follow-up with Dr. Lucky Cowboy with ABIs ?            -- Status post right partial first ray amputation  ?            -- Right heel weightbearing with postop shoe ? ?9: prior CVA: ?10: Dyslipidemia: Continue Lipitor 40 mg daily ?11: Acute PE: Continue Eliquis ?12: Hypertension: Continue Norvasc 10 mg daily ?.cbg ? ?13: DM type II with peripheral neuropathy  Restarted metformin 251m daily.  ? CBG (last 3) will order , pt states he has no CBG monitor at home  ?No results for input(s):  GLUCAP in the last 72 hours. ? ?14: Seizure disorder maintained on Keppra: Continue 500 mg daily, ? If PCP managed this in past  ?15: Constipation: give sorbitol times once dose now ?            --schedule senna BID, BM daily, no incont , pt does not recall  ?16: Urinary retention: d/c foley ?            -- Continue  Flomax ? Improved ?17: Chronic diastolic congestive heart failure, euvolemic, no diuretics ?            -- Grade 1 diastolic dysfunction on recent echo ?18.  Hyponatremia ? Sodium 130 on 4/12, labs ordered for tomorrow ?19.  Bradykinesia , masked facies, min cogwheeling, no hx PD may need outpt neuro eval on this, no showed for appt with  Dr Jaynee Eagles  ?Hx of remote RIght subcortical infarcts which may cause a parkinson like syndrome  , seizure d/o ?LOS: 5 days ?A FACE TO FACE EVALUATION WAS PERFORMED ? ?Luanna Salk Elidia Bonenfant ?05/11/2021, 8:26 AM ? ?

## 2021-05-11 NOTE — Patient Care Conference (Signed)
Inpatient RehabilitationTeam Conference and Plan of Care Update ?Date: 05/11/2021   Time: 10:10 AM  ? ? ?Patient Name: [name]      ?Medical Record Number: [id number]  ?Date of Birth: Jun 20, 1960 ?Sex: Male         ?Room/Bed: 4M10C/4M10C-01 ?Payor Info: Payor: MEDICARE / Plan: MEDICARE PART A AND B / Product Type: *No Product type* /   ? ?Admit Date/Time:  05/06/2021 11:44 AM ? ?Primary Diagnosis:  Pulmonary embolism ([place]) ? ?Hospital Problems: Principal Problem: ?  Pulmonary embolism ([place]) ?Active Problems: ?  Essential hypertension ?  Acute pulmonary embolism ([place]) ?  Controlled type 2 diabetes mellitus with hyperglycemia, without long-term current use of insulin ([place]) ? ? ? ?Expected Discharge Date: Expected Discharge Date: 05/25/21 ? ?Team Members Present: ?Physician leading conference: Dr. Alysia Penna ?Social Worker Present: Erlene Quan, BSW ?Nurse Present: Dorien Chihuahua, RN ?PT Present: Barrie Folk, PT ?OT Present: Providence Lanius, OT ?SLP Present: Sherren Kerns, SLP ? ?   Current Status/Progress Goal Weekly Team Focus  ?Bowel/Bladder ? ? Pt is incontinent of bowel/bladder  Pt will gain continence of bowel/bladder  Will assess qshift and PRN   ?Swallow/Nutrition/ Hydration ? ?           ?ADL's ? ? mod A overall  supervision bathing and toileting; min A LB dressing  ADL training, functional mobility, strengthening, pt/fam educ   ?Mobility ? ?           ?Communication ? ?           ?Safety/Cognition/ Behavioral Observations ?           ?Pain ? ? Pt is currently pain free  Pt will remain pain free  Will assess qshift and PRN   ?Skin ? ? Pt's skin is intact  Pt's skin will remain intact  Will assess qshift and PRN   ? ? ?Discharge Planning:  ?discharging home with brother to provide supervision only   ?Team Discussion: ?Patient with debility post osteo with ray amputation and PE. Patient presents with parkinsonian like behavior; slow process, poor initiation; likely due to multi CVAs with SVD.  Foot incision looks good with sutures. Will need assistance with steps at discharge due to posterior bias and darco boot on affected foot. Constipation and urinary retention addressed; hopeful to go home without need for intermittent catheterization. ? ?Patient on target to meet rehab goals: ?yes, currently needs min assist overall but mod assist for upper body care and max - total for lower body care. Sit - stand transfers with mod assist and only needs CGA once up.  Goals for discharge set for min assist and CGA- min for steps. ? ?*See Care Plan and progress notes for long and short-term goals.  ? ?Revisions to Treatment Plan:  ?SLP for cognition assessment ?  ?Teaching Needs: ?CBG monitoring, safety, skin care, medications, secondary risk management, transfers and toileting.  ?Current Barriers to Discharge: ?Decreased caregiver support, Home enviroment access/layout, and Wound care ? ?Possible Resolutions to Barriers: ?Family education with brother ?DME: w/c ?HH follow up   ? ? Medical Summary ?Current Status: Restarting CBGs, wound looking ok, urinary retention, parkinsonian features , slow to process ? Barriers to Discharge: Medical stability;Neurogenic Bowel & Bladder;Wound care;Weight bearing restrictions;Home enviroment access/layout ?  ?Possible Resolutions to [employer]: will need training for stairs due to 2nd story apt, trial of meds for urinary retention , family training ? ? ?Continued Need for Acute Rehabilitation Level of Care: The patient requires  daily medical management by a physician with specialized training in physical medicine and rehabilitation for the following reasons: ?Direction of a multidisciplinary physical rehabilitation program to maximize functional independence : Yes ?Medical management of patient stability for increased activity during participation in an intensive rehabilitation regime.: Yes ?Analysis of laboratory values and/or radiology reports with any subsequent  need for medication adjustment and/or medical intervention. : Yes ? ? ?I attest that I was present, lead the team conference, and concur with the assessment and plan of the team. ? ? ?Dorien Chihuahua B ?05/11/2021, 2:27 PM  ? ? ? ? ? ? ?

## 2021-05-11 NOTE — Progress Notes (Signed)
Physical Therapy Session Note ? ?Patient Details  ?Name: [name] ?MRN: [id number] ?Date of Birth: 02/02/1960 ? ?Today's Date: 05/11/2021 ?PT Individual Time: 5456-2563 ?PT Individual Time Calculation (min): 53 min  ? ?Short Term Goals: ?Week 1:  PT Short Term Goal 1 (Week 1): pt will perform bed mobility with CGA ?PT Short Term Goal 2 (Week 1): Pt will trasnfer to [facility] with min assist consistently ?PT Short Term Goal 3 (Week 1): Pt will ambulate 181ft with min assist and LRAD ?PT Short Term Goal 4 (Week 1): Pt will ascend 8 steps with min assist ? ? ?Skilled Therapeutic Interventions/Progress Updates:  ? ?Pt received supine in bed and agreeable to PT. Supine>sit transfer with supervision with log roll to the R. Cues for use of bed rail and sequencing of movement to push to sitting. Stand pivot transfer to [facility] with min assist from elevated bed and UE support on RW. ? ?WC mobility through hall x 124ft with min assist and cues for ROM and force of push through BUE. .  ? ?Sit<>stand x 5 with therapeutic rest break between bouts. PT provided pt with full arm rest with improve anterior weight shift noted compared to half arm rest mod assist progressing to min assist and moderate cues for anterior weight shift. ? ?Gait training  x144ft with RW with CGA once in standing with min cues for AD management  ? ?Seated BLE therex to strengthen glute med and max. Hip abduction and extension from flexed position. 2 x 10 with level 3 tband with cues for full ROM and decreased eccentric speed.  ? ?   ? ?Therapy Documentation ?Precautions:  ?Precautions ?Precautions: Fall ?Precaution Comments: baseline R HP and cognitive deficits ?Restrictions ?Weight Bearing Restrictions: Yes ?RLE Weight Bearing: Weight bearing as tolerated ?Other Position/Activity Restrictions: heel WB with surgical shoe ? ?Pain: ? denies ? ? ?Therapy/Group: Individual Therapy ? ?[name] ?05/11/2021, 10:11 AM  ?

## 2021-05-11 NOTE — Progress Notes (Signed)
Occupational Therapy Session Note ? ?Patient Details  ?Name: [name] ?MRN: [id number] ?Date of Birth: 02/08/1960 ? ?Today's Date: 05/11/2021 ?OT Individual Time: 3143-8887 ?OT Individual Time Calculation (min): 30 min  + 35 min and Today's Date: 05/11/2021 ?OT Missed Time: 30 Minutes ?Missed Time Reason: Other (comment) (breakfast) ? ? ?Short Term Goals: ?Week 1:  OT Short Term Goal 1 (Week 1): pt will complete >2 standing grooming task with CGA ?OT Short Term Goal 2 (Week 1): Pt will complete ambulatory toilet transfer with min A and LRAD ?OT Short Term Goal 3 (Week 1): Pt will don pants with mod A ?OT Short Term Goal 4 (Week 1): Pt will assist with donning surgical shoe wwith no more than mod A. ? ?Skilled Therapeutic Interventions/Progress Updates:  ?  Session 1 [phone]): Pt received semi-reclined in bed with NT present, denies pain, agreeable to therapy. Session focus on self-care retraining, activity tolerance, transfer retraining, DC recs in prep for improved ADL/IADL/func mobility performance + decreased caregiver burden. ? ?Pt about to eat breakfast, agreeable to transfer to w/c for improved positioning. Came to sitting EOB with min A to lift trunk. Maintains static sitting balance close S. Total A to don L shoe and R darco boot. Stand-pivot to w/c on his L with RW and heavy min A to power up, cues to extend trunk and hips.  ? ?Independent in self-feed/opening breakfast items. Discussed DC and DME recs, pt reports having RW, TTB, and 3in1. Endorses that transport to OP therapy follow up would be challenging. Reports he primarily stays at home, only goes out for medical appointments and is primarily concerned about going up the stairs to get into his 2nd story apartment. ? ?Pt declines need for additional ADL at this time, requesting to finish breakfast/taking increased time. Pt missed 30 min of therapy due to breakfast. ? ?Pt left seated in w/c with safety belt alarm engaged, call bell in reach, and  all immediate needs met.  ? ?Session 2 [id number]) : Pt received semi-reclined in bed with dietary services present, no c/o pain, agreeable to therapy. Session focus on activity tolerance, transfer retraining in prep for improved ADL/IADL/func mobility performance + decreased caregiver burden. Came to sitting EOB with S, total A to don R darco and L shoe for time management. Short ambulatory transfers throughout session with light mod to power up due to posterior bias/crouched posture but progresses to CGA with RW in standing.  ? ?Stood x2 to participate in corn hole, from elevated height continues to require heavy min A to power up and facilitate upright posture. Able to retrieve bags from outside BOS on his R and close S for balance while standing. ? ? ?Pt left semi-reclined in bed with bed alarm engaged, call bell in reach, and all immediate needs met.  ? ? ?Therapy Documentation ?Precautions:  ?Precautions ?Precautions: Fall ?Precaution Comments: baseline R HP and cognitive deficits ?Restrictions ?Weight Bearing Restrictions: Yes ?RLE Weight Bearing: Weight bearing as tolerated ?Other Position/Activity Restrictions: heel WB with surgical shoe ? ?Pain: denies ?  ?ADL: See Care Tool for more details. ?  ? ?Therapy/Group: Individual Therapy ? ?Volanda Napoleon MS, OTR/L ? ?05/11/2021, 6:58 AM ?

## 2021-05-11 NOTE — Plan of Care (Signed)
?  Problem: Consults ?Goal: RH GENERAL PATIENT EDUCATION ?Description: See Patient Education module for education specifics. ?Outcome: Progressing ?  ?Problem: RH BOWEL ELIMINATION ?Goal: RH STG MANAGE BOWEL WITH ASSISTANCE ?Description: STG Manage Bowel with mod I Assistance. ?Outcome: Progressing ?Goal: RH STG MANAGE BOWEL W/MEDICATION W/ASSISTANCE ?Description: STG Manage Bowel with Medication with mod I Assistance. ?Outcome: Progressing ?  ?Problem: RH BLADDER ELIMINATION ?Goal: RH STG MANAGE BLADDER WITH ASSISTANCE ?Description: STG Manage Bladder With min  Assistance ?Outcome: Progressing ?Goal: RH STG MANAGE BLADDER WITH EQUIPMENT WITH ASSISTANCE ?Description: STG Manage Bladder With Equipment With min Assistance ?Outcome: Progressing ?  ?Problem: RH SKIN INTEGRITY ?Goal: RH STG MAINTAIN SKIN INTEGRITY WITH ASSISTANCE ?Description: STG Maintain Skin Integrity With min Assistance. ?Outcome: Progressing ?Goal: RH STG ABLE TO PERFORM INCISION/WOUND CARE W/ASSISTANCE ?Description: STG Able To Perform Incision/Wound Care With min Assistance. ?Outcome: Progressing ?  ?Problem: RH SAFETY ?Goal: RH STG ADHERE TO SAFETY PRECAUTIONS W/ASSISTANCE/DEVICE ?Description: STG Adhere to Safety Precautions With cues Assistance/Device. ?Outcome: Progressing ?  ?Problem: RH PAIN MANAGEMENT ?Goal: RH STG PAIN MANAGED AT OR BELOW PT'S PAIN GOAL ?Description: At or below level 4 with prns ?Outcome: Progressing ?  ?Problem: RH KNOWLEDGE DEFICIT GENERAL ?Goal: RH STG INCREASE KNOWLEDGE OF SELF CARE AFTER HOSPITALIZATION ?Description: Patient and brother will be able to manage care at discharge using handouts and educational materials independently ?Outcome: Progressing ?  ?Problem: RH KNOWLEDGE DEFICIT LIMB LOSS ?Goal: RH STG INCREASE KNOWLEDGE OF SELF CARE AFTER LIMB LOSS ?Description: Patient and brother will be able to manage incision care/foot care at discharge using handouts and educational materials independently ?Outcome:  Progressing ?  ?Problem: RH KNOWLEDGE DEFICIT ?Goal: RH STG INCREASE KNOWLEDGE OF DIABETES ?Description: Patient and brother will be able to manage DM, medications and dietary modifications at discharge using handouts and educational materials independently ?Outcome: Progressing ?Goal: RH STG INCREASE KNOWLEDGE OF HYPERTENSION ?Description: Patient and brother will be able to manage HTN, medications and dietary modifications at discharge using handouts and educational materials independently ?Outcome: Progressing ?Goal: RH STG INCREASE KNOWLEGDE OF HYPERLIPIDEMIA ?Description: Patient and brother will be able to manage HLD, medications and dietary modifications at discharge using handouts and educational materials independently ?Outcome: Progressing ?Goal: RH STG INCREASE KNOWLEDGE OF STROKE PROPHYLAXIS ?Description: Patient and brother will be able to manage secondary stroke risks, medications and dietary modifications at discharge using handouts and educational materials independently ?Outcome: Progressing ?  ?

## 2021-05-11 NOTE — Progress Notes (Signed)
Physical Therapy Session Note ? ?Patient Details  ?Name: [name] ?MRN: [id number] ?Date of Birth: [birth year] ? ?Today's Date: 05/11/2021 ?PT Individual Time: 4627-0350 ?PT Individual Time Calculation (min): 53 min  ? ?Short Term Goals: ?Week 1:  PT Short Term Goal 1 (Week 1): pt will perform bed mobility with CGA ?PT Short Term Goal 2 (Week 1): Pt will trasnfer to [facility] with min assist consistently ?PT Short Term Goal 3 (Week 1): Pt will ambulate 151f with min assist and LRAD ?PT Short Term Goal 4 (Week 1): Pt will ascend 8 steps with min assist ? ? ?Skilled Therapeutic Interventions/Progress Updates:  ?   ?Pt received supine in bed and agreeable to PT. Donning shoes in bed with max assist for time management. Pt reports need for BM.  Supine>sit transfer with supervision assist and safety cues. [facility] form elevated bed height. Pt performed clothing management to prepare for toiletting. Unable to void sitting on toilet. [facility]. PT performed clothing management to don pants. [facility].  ? ?[facility] with RW x 554fwith min assist and cues for improved heel strike on the RLE. Nustep reciprocal BLE strengthening/endurance training x 8 min with cues for full ROM and increased speed.  ? ?PT instructed pt in stair management training to ascend/descend 4 steps with min-mod assist from PT with cues for sequencing, improved posture, and awareness of precautions to maintain WB through heel only on the RLE.  ? ?Pt returned to room and performed stand pivot transfer to bed with min assist and cues for BUE positioning to push from arm rest. PT assisted to doff shoes EOB with max assist for time management Sit>supine completed with supervision assist, and left supine in bed with call bell in reach and all needs met.  ?  ? ? ? ?Therapy  Documentation ?Precautions:  ?Precautions ?Precautions: Fall ?Precaution Comments: baseline R HP and cognitive deficits ?Restrictions ?Weight Bearing Restrictions: Yes ?RLE Weight Bearing: Weight bearing as tolerated ?Other Position/Activity Restrictions: heel WB with surgical shoe ? ?Vital Signs: ?Therapy Vitals ?Temp: 98.2 ?F (36.8 ?C) ?Temp Source: Oral ?Pulse Rate: 81 ?Resp: 14 ?BP: 137/74 ?Oxygen Therapy ?SpO2: 100 % ?Pain: ? denies ? ? ? ?Therapy/Group: Individual Therapy ? ?AuLorie Phenix4/19/2023, 4:42 PM  ?

## 2021-05-12 LAB — GLUCOSE, CAPILLARY
Glucose-Capillary: 144 mg/dL — ABNORMAL HIGH (ref 70–99)
Glucose-Capillary: 159 mg/dL — ABNORMAL HIGH (ref 70–99)
Glucose-Capillary: 188 mg/dL — ABNORMAL HIGH (ref 70–99)

## 2021-05-12 MED ORDER — METFORMIN HCL 500 MG PO TABS
500.0000 mg | ORAL_TABLET | Freq: Every day | ORAL | Status: DC
  Administered 2021-05-13 – 2021-05-23 (×11): 500 mg via ORAL
  Filled 2021-05-12 (×11): qty 1

## 2021-05-12 NOTE — Plan of Care (Signed)
?  Problem: RH Problem Solving ?Goal: LTG Patient will demonstrate problem solving for (SLP) ?Description: LTG:  Patient will demonstrate problem solving for basic/complex daily situations with cues  (SLP) ?Flowsheets (Taken 05/12/2021 1658) ?LTG: Patient will demonstrate problem solving for (SLP): Basic daily situations ?LTG Patient will demonstrate problem solving for: ? Supervision ? Minimal Assistance - Patient > 75% ?  ?Problem: RH Memory ?Goal: LTG Patient will use memory compensatory aids to (SLP) ?Description: LTG:  Patient will use memory compensatory aids to recall biographical/new, daily complex information with cues (SLP) ?Flowsheets (Taken 05/12/2021 1659) ?LTG: Patient will use memory compensatory aids to (SLP): Supervision ?  ?Problem: RH Awareness ?Goal: LTG: Patient will demonstrate awareness during functional activites type of (SLP) ?Description: LTG: Patient will demonstrate awareness during functional activites type of (SLP) ?Flowsheets (Taken 05/12/2021 1659) ?Patient will demonstrate during cognitive/linguistic activities awareness type of: Intellectual ?LTG: Patient will demonstrate awareness during cognitive/linguistic activities with assistance of (SLP): Supervision ?  ?

## 2021-05-12 NOTE — Progress Notes (Signed)
Occupational Therapy Session Note ? ?Patient Details  ?Name: [name] ?MRN: [id number] ?Date of Birth: [birth year] ? ?Today's Date: 05/12/2021 ?OT Individual Time: 4259-5638 ?OT Individual Time Calculation (min): 53 min  ? ? ?Short Term Goals: ?Week 1:  OT Short Term Goal 1 (Week 1): pt will complete >2 standing grooming task with CGA ?OT Short Term Goal 2 (Week 1): Pt will complete ambulatory toilet transfer with min A and LRAD ?OT Short Term Goal 3 (Week 1): Pt will don pants with mod A ?OT Short Term Goal 4 (Week 1): Pt will assist with donning surgical shoe wwith no more than mod A. ? ?Skilled Therapeutic Interventions/Progress Updates:  ?   ?Pt received seated in recliner finished with breakfast, no c/o pain, agreeable to therapy. Session focus on self-care retraining, activity tolerance, BUE strengthening in prep for improved ADL/IADL/func mobility performance + decreased caregiver burden. ?Requesting to wash up at sink. Short ambulatory transfer > w/c at sink with mod A to power up and CGA remainder of way with RW posterior bias in standing initially. ? ?Min A to pull shirt over head to doff, bathed UB with S and applied deodorant, min A to pull down shirt in back when donning, cues to pull down in front. Sit to stand x3 with BUE on sink with mod A to power up, posterior bias and cues for upright posture. Max A overall for LBD (to thread RLE and pull over R hip.). Pt able to thread LLE with use of trash can as foot stool. Mod A for LB bathing to reach buttocks and lower BLE. Total A to don L shoe and R darco boot.  ? ?Pt completed 1x10 of the following with 2 lb dowel rod: chest press, overhead reach, forward/backward circles, and bouncing and tossing ball to target BUE strengthening in prep for improved ADL/transfer performance. Unable to complete w/c push-up this date due to generalized weakness. ? ?Pt left seated in w/c with safety belt alarm engaged, call bell in reach, and all immediate needs met.   ? ?Therapy Documentation ?Precautions:  ?Precautions ?Precautions: Fall ?Precaution Comments: baseline R HP and cognitive deficits ?Restrictions ?Weight Bearing Restrictions: Yes ?RLE Weight Bearing: Weight bearing as tolerated ?Other Position/Activity Restrictions: heel WB with surgical shoe ? ?Pain: ? No c/o throughout ?ADL: See Care Tool for more details. ? ? ?Therapy/Group: Individual Therapy ? ?Volanda Napoleon MS, OTR/L ? ?05/12/2021, 6:49 AM ?
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
## 2021-05-12 NOTE — Plan of Care (Signed)
?  Problem: Consults ?Goal: RH GENERAL PATIENT EDUCATION ?Description: See Patient Education module for education specifics. ?Outcome: Progressing ?  ?Problem: RH BOWEL ELIMINATION ?Goal: RH STG MANAGE BOWEL WITH ASSISTANCE ?Description: STG Manage Bowel with mod I Assistance. ?Outcome: Progressing ?Goal: RH STG MANAGE BOWEL W/MEDICATION W/ASSISTANCE ?Description: STG Manage Bowel with Medication with mod I Assistance. ?Outcome: Progressing ?  ?Problem: RH BLADDER ELIMINATION ?Goal: RH STG MANAGE BLADDER WITH ASSISTANCE ?Description: STG Manage Bladder With min  Assistance ?Outcome: Progressing ?Goal: RH STG MANAGE BLADDER WITH EQUIPMENT WITH ASSISTANCE ?Description: STG Manage Bladder With Equipment With min Assistance ?Outcome: Progressing ?  ?Problem: RH SKIN INTEGRITY ?Goal: RH STG MAINTAIN SKIN INTEGRITY WITH ASSISTANCE ?Description: STG Maintain Skin Integrity With min Assistance. ?Outcome: Progressing ?Goal: RH STG ABLE TO PERFORM INCISION/WOUND CARE W/ASSISTANCE ?Description: STG Able To Perform Incision/Wound Care With min Assistance. ?Outcome: Progressing ?  ?Problem: RH SAFETY ?Goal: RH STG ADHERE TO SAFETY PRECAUTIONS W/ASSISTANCE/DEVICE ?Description: STG Adhere to Safety Precautions With cues Assistance/Device. ?Outcome: Progressing ?  ?Problem: RH PAIN MANAGEMENT ?Goal: RH STG PAIN MANAGED AT OR BELOW PT'S PAIN GOAL ?Description: At or below level 4 with prns ?Outcome: Progressing ?  ?Problem: RH KNOWLEDGE DEFICIT GENERAL ?Goal: RH STG INCREASE KNOWLEDGE OF SELF CARE AFTER HOSPITALIZATION ?Description: Patient and brother will be able to manage care at discharge using handouts and educational materials independently ?Outcome: Progressing ?  ?Problem: RH KNOWLEDGE DEFICIT LIMB LOSS ?Goal: RH STG INCREASE KNOWLEDGE OF SELF CARE AFTER LIMB LOSS ?Description: Patient and brother will be able to manage incision care/foot care at discharge using handouts and educational materials independently ?Outcome:  Progressing ?  ?Problem: RH KNOWLEDGE DEFICIT ?Goal: RH STG INCREASE KNOWLEDGE OF DIABETES ?Description: Patient and brother will be able to manage DM, medications and dietary modifications at discharge using handouts and educational materials independently ?Outcome: Progressing ?Goal: RH STG INCREASE KNOWLEDGE OF HYPERTENSION ?Description: Patient and brother will be able to manage HTN, medications and dietary modifications at discharge using handouts and educational materials independently ?Outcome: Progressing ?Goal: RH STG INCREASE KNOWLEGDE OF HYPERLIPIDEMIA ?Description: Patient and brother will be able to manage HLD, medications and dietary modifications at discharge using handouts and educational materials independently ?Outcome: Progressing ?Goal: RH STG INCREASE KNOWLEDGE OF STROKE PROPHYLAXIS ?Description: Patient and brother will be able to manage secondary stroke risks, medications and dietary modifications at discharge using handouts and educational materials independently ?Outcome: Progressing ?  ?

## 2021-05-12 NOTE — Progress Notes (Signed)
Physical Therapy Session Note ? ?Patient Details  ?Name: [name] ?MRN: [id number] ?Date of Birth: February 20, 1960 ? ?Today's Date: 05/12/2021 ?PT Individual Time: 7209-1980 ?PT Individual Time Calculation (min): 53 min  ? ?Short Term Goals: ?Week 1:  PT Short Term Goal 1 (Week 1): pt will perform bed mobility with CGA ?PT Short Term Goal 2 (Week 1): Pt will trasnfer to [facility] with min assist consistently ?PT Short Term Goal 3 (Week 1): Pt will ambulate 128ft with min assist and LRAD ?PT Short Term Goal 4 (Week 1): Pt will ascend 8 steps with min assist ?Week 2:    ?Week 3:    ? ?Skilled Therapeutic Interventions/Progress Updates:  ?   ? ?Therapy Documentation ?Precautions:  ?Precautions ?Precautions: Fall ?Precaution Comments: baseline R HP and cognitive deficits ?Restrictions ?Weight Bearing Restrictions: Yes ?RLE Weight Bearing: Weight bearing as tolerated ?Other Position/Activity Restrictions: heel WB with surgical shoe ?General: ?  ?Vital Signs: ?  ?Pain: ?  ?Mobility: ?  ?Locomotion : ?   ?Trunk/Postural Assessment : ?   ?Balance: ?  ?Exercises: ?  ?Other Treatments:   ? ? ? ?Therapy/Group: Individual Therapy ? ?[name] ?05/12/2021, 1:48 PM  ?

## 2021-05-12 NOTE — Progress Notes (Signed)
[place] PHYSICAL MEDICINE & REHABILITATION PROGRESS NOTE ? ?Subjective/Complaints: ? ?No c/o , unaware to d/c date  ?ROS: Denies CP, SOB, N/V/D ? ? ?Objective: ?Vital Signs: ?Blood pressure 135/79, pulse 68, temperature 98.4 ?F (36.9 ?C), resp. rate 18, height 5\' 7"  (1.702 m), weight 71 kg, SpO2 100 %. ?No results found. ?No results for input(s): WBC, HGB, HCT, PLT in the last 72 hours. ? ?No results for input(s): NA, K, CL, CO2, GLUCOSE, BUN, CREATININE, CALCIUM in the last 72 hours. ? ? ?Intake/Output Summary (Last 24 hours) at 05/12/2021 0806 ?Last data filed at 05/12/2021 0153 ?Gross per 24 hour  ?Intake --  ?Output 1076 ml  ?Net -1076 ml  ? ?  ? ?  ? ?Physical Exam: ?BP 135/79 (BP Location: Left Arm)   Pulse 68   Temp 98.4 ?F (36.9 ?C)   Resp 18   Ht 5\' 7"  (1.702 m)   Wt 71 kg   SpO2 100%   BMI 24.52 kg/m?  ? ?General: No acute distress ?Mood and affect are appropriate ?Heart: Regular rate and rhythm no rubs murmurs or extra sounds ?Lungs: Clear to auscultation, breathing unlabored, no rales or wheezes ?Abdomen: Positive bowel sounds, soft nontender to palpation, nondistended ? ? ?Skin: Warm and dry.  Intact. ?Psych: Normal behavior.  Normal affect. ?Musc:    Right amp incision with dressing CDI, unchanged ?Neuro: 4-/5 throughout,  ?Minimal cogwheeling at elbows but not at wrist, movements bradykinetic, masked facies  ?Hand intrinsic atrophy with reduced sensation in 5th fingers bilateral , also reduced sensation feet to LT , poor concentration /distractibility limits testing  ? ?Assessment/Plan: ?1. Functional deficits which require 3+ hours per day of interdisciplinary therapy in a comprehensive inpatient rehab setting. ?Physiatrist is providing close team supervision and 24 hour management of active medical problems listed below. ?Physiatrist and rehab team continue to assess barriers to discharge/monitor patient progress toward functional and medical goals ? ? ?Care Tool: ? ?Bathing ?   ?Body parts  bathed by patient: Right arm, Left arm, Chest, Abdomen, Front perineal area, Right upper leg, Left upper leg, Face  ? Body parts bathed by helper: Right lower leg, Left lower leg, Buttocks ?  ?  ?Bathing assist Assist Level: Moderate Assistance - Patient 50 - 74% (standing for washing buttocks) ?  ?  ?Upper Body Dressing/Undressing ?Upper body dressing   ?What is the patient wearing?: Pull over shirt ?   ?Upper body assist Assist Level: Moderate Assistance - Patient 50 - 74% ?   ?Lower Body Dressing/Undressing ?Lower body dressing ? ? ?   ?What is the patient wearing?: Incontinence brief, Pants ? ?  ? ?Lower body assist Assist for lower body dressing: Maximal Assistance - Patient 25 - 49% ?   ? ?Toileting ?Toileting Toileting Activity did not occur (Probation officer and hygiene only): N/A (no void or bm)  ?Toileting assist Assist for toileting: Maximal Assistance - Patient 25 - 49% ?  ?  ?Transfers ?Chair/bed transfer ? ?Transfers assist ?   ? ?Chair/bed transfer assist level: Minimal Assistance - Patient > 75% ?  ?  ?Locomotion ?Ambulation ? ? ?Ambulation assist ? ?   ? ?Assist level: Minimal Assistance - Patient > 75% ?Assistive device: Walker-rolling ?Max distance: 120 ft  ? ?Walk 10 feet activity ? ? ?Assist ?   ? ?Assist level: Minimal Assistance - Patient > 75% ?Assistive device: Walker-rolling  ? ?Walk 50 feet activity ? ? ?Assist Walk 50 feet with 2 turns activity did not  occur: Safety/medical concerns ? ?Assist level: Minimal Assistance - Patient > 75% ?Assistive device: Walker-rolling  ? ? ?Walk 150 feet activity ? ? ?Assist Walk 150 feet activity did not occur: Safety/medical concerns ? ?  ?  ?  ? ?Walk 10 feet on uneven surface  ?activity ? ? ?Assist Walk 10 feet on uneven surfaces activity did not occur: Safety/medical concerns ? ? ?  ?   ? ?Wheelchair ? ? ? ? ?Assist Is the patient using a wheelchair?: Yes ?Type of Wheelchair: Manual ?  ? ?Wheelchair assist level: Minimal Assistance - Patient >  75% ?Max wheelchair distance: 150  ? ? ?Wheelchair 50 feet with 2 turns activity ? ? ? ?Assist ? ?  ?  ? ? ?Assist Level: Minimal Assistance - Patient > 75%  ? ?Wheelchair 150 feet activity  ? ? ? ?Assist ?   ? ? ?Assist Level: Minimal Assistance - Patient > 75%  ? ? ?Medical Problem List and Plan: ?1. Functional deficits secondary to peripheral arterial disease, RIght 1st ray, 2,3,4 toe amps due to osteomyelitis with pop art stenting Right side surgery and acute pulmonary embolus. ?Continue CIR PT, OT - ELOS 5/3 ? ?2.  Antithrombotics: ?-DVT/anticoagulation:  Pharmaceutical: Other (comment) Eliquis ?            -antiplatelet therapy: Aspirin 81 mg daily ?3. Pain Management: Tylenol, tramadol as needed ? Monitor with increased exertion ?4. Onchomycosis of left toes: antifungal cream ordered BID.  ?5. Neuropsych: This patient is capable of making decisions on his own behalf. ?6. Right foot incision: ?            --Daily dry dressing change ?7. Fluids/Electrolytes/Nutrition: Routine I's and O's ?            -- Carb modified diet ?8: PAD: Status post Right popliteal stent placement ?            -- Continue aspirin while on Eliquis.  Stop Plavix. ?            -- Follow-up with Dr. Lucky Cowboy with ABIs ?            -- Status post right partial first ray amputation  ?            -- Right heel weightbearing with postop shoe ? ?9: prior CVA: ?10: Dyslipidemia: Continue Lipitor 40 mg daily ?11: Acute PE: Continue Eliquis ?12: Hypertension: Continue Norvasc 10 mg daily ?.cbg ? ?13: DM type II with peripheral neuropathy  Restarted metformin 250mg  daily.  ? CBG (last 3) will order , pt states he has no CBG monitor at home  ?Recent Labs  ?  05/11/21 ?1150  ?GLUCAP 183*  ?[place] ac hs ? ?14: Seizure disorder maintained on Keppra: Continue 500 mg daily, ? If PCP managed this in past  ?15: Constipation: give sorbitol times once dose now ?            --schedule senna BID, BM daily, no incont , pt does not recall  ?16: Urinary retention:  d/c foley ?            -- Continue Flomax ? Improved ?17: Chronic diastolic congestive heart failure, euvolemic, no diuretics ?            -- Grade 1 diastolic dysfunction on recent echo ?18.  Hyponatremia ? Sodium 130 on 4/12, labs ordered for tomorrow ?19.  Bradykinesia , masked facies, min cogwheeling, no hx PD may need outpt neuro eval on this, no  showed for appt with  Dr Jaynee Eagles  ?Hx of remote RIght subcortical infarcts which may cause a parkinson like syndrome  , seizure d/o ?LOS: 6 days ?A FACE TO FACE EVALUATION WAS PERFORMED ? ?Luanna Salk Alima Naser ?05/12/2021, 8:06 AM ? ?

## 2021-05-13 LAB — GLUCOSE, CAPILLARY
Glucose-Capillary: 159 mg/dL — ABNORMAL HIGH (ref 70–99)
Glucose-Capillary: 105 mg/dL — ABNORMAL HIGH (ref 70–99)
Glucose-Capillary: 154 mg/dL — ABNORMAL HIGH (ref 70–99)

## 2021-05-13 NOTE — Plan of Care (Signed)
?  Problem: Consults ?Goal: RH GENERAL PATIENT EDUCATION ?Description: See Patient Education module for education specifics. ?Outcome: Progressing ?  ?Problem: RH BOWEL ELIMINATION ?Goal: RH STG MANAGE BOWEL WITH ASSISTANCE ?Description: STG Manage Bowel with mod I Assistance. ?Outcome: Progressing ?Goal: RH STG MANAGE BOWEL W/MEDICATION W/ASSISTANCE ?Description: STG Manage Bowel with Medication with mod I Assistance. ?Outcome: Progressing ?  ?Problem: RH BLADDER ELIMINATION ?Goal: RH STG MANAGE BLADDER WITH ASSISTANCE ?Description: STG Manage Bladder With min  Assistance ?Outcome: Progressing ?Goal: RH STG MANAGE BLADDER WITH EQUIPMENT WITH ASSISTANCE ?Description: STG Manage Bladder With Equipment With min Assistance ?Outcome: Progressing ?  ?Problem: RH SKIN INTEGRITY ?Goal: RH STG MAINTAIN SKIN INTEGRITY WITH ASSISTANCE ?Description: STG Maintain Skin Integrity With min Assistance. ?Outcome: Progressing ?Goal: RH STG ABLE TO PERFORM INCISION/WOUND CARE W/ASSISTANCE ?Description: STG Able To Perform Incision/Wound Care With min Assistance. ?Outcome: Progressing ?  ?Problem: RH SAFETY ?Goal: RH STG ADHERE TO SAFETY PRECAUTIONS W/ASSISTANCE/DEVICE ?Description: STG Adhere to Safety Precautions With cues Assistance/Device. ?Outcome: Progressing ?  ?Problem: RH PAIN MANAGEMENT ?Goal: RH STG PAIN MANAGED AT OR BELOW PT'S PAIN GOAL ?Description: At or below level 4 with prns ?Outcome: Progressing ?  ?Problem: RH KNOWLEDGE DEFICIT GENERAL ?Goal: RH STG INCREASE KNOWLEDGE OF SELF CARE AFTER HOSPITALIZATION ?Description: Patient and brother will be able to manage care at discharge using handouts and educational materials independently ?Outcome: Progressing ?  ?Problem: RH KNOWLEDGE DEFICIT LIMB LOSS ?Goal: RH STG INCREASE KNOWLEDGE OF SELF CARE AFTER LIMB LOSS ?Description: Patient and brother will be able to manage incision care/foot care at discharge using handouts and educational materials independently ?Outcome:  Progressing ?  ?Problem: RH KNOWLEDGE DEFICIT ?Goal: RH STG INCREASE KNOWLEDGE OF DIABETES ?Description: Patient and brother will be able to manage DM, medications and dietary modifications at discharge using handouts and educational materials independently ?Outcome: Progressing ?Goal: RH STG INCREASE KNOWLEDGE OF HYPERTENSION ?Description: Patient and brother will be able to manage HTN, medications and dietary modifications at discharge using handouts and educational materials independently ?Outcome: Progressing ?Goal: RH STG INCREASE KNOWLEGDE OF HYPERLIPIDEMIA ?Description: Patient and brother will be able to manage HLD, medications and dietary modifications at discharge using handouts and educational materials independently ?Outcome: Progressing ?Goal: RH STG INCREASE KNOWLEDGE OF STROKE PROPHYLAXIS ?Description: Patient and brother will be able to manage secondary stroke risks, medications and dietary modifications at discharge using handouts and educational materials independently ?Outcome: Progressing ?  ?

## 2021-05-13 NOTE — Progress Notes (Signed)
Speech Language Pathology Daily Session Note ? ?Patient Details  ?Name: Seymour Pavlak ?MRN: [id number] ?Date of Birth: 1961-01-11 ? ?Today's Date: 05/13/2021 ?SLP Individual Time: 1002-1100 ?SLP Individual Time Calculation (min): 58 min ? ?Short Term Goals: ?Week 1: SLP Short Term Goal 1 (Week 1): Patient will utilize external memory aids to recall weight bearing precautions with min A verbal cues ?SLP Short Term Goal 2 (Week 1): Patient will verbalize 2 safety precautions associated with new limitations at min A level ?SLP Short Term Goal 3 (Week 1): Patient will demonstrate functional problem solving for basic-to-mildly complex tasks with min A verbal cues. ? ?Skilled Therapeutic Interventions: ?Pt seen for skilled ST with focus on cognitive goals, pt upright in wheelchair and lethargic but agreeable and participatory throughout tx session. Pt with vague recall of earlier PT session, denies walking but states "we exercised". SLP initiating memory notebook to increase recall and carryover of functional information. Pt disoriented to DOW, MOY and year this date but was aware of hospital and location, external aid (calendar) posted in room to promote orientation to time concepts. SLP facilitating mildly complex problem solving exercise with patient benefiting from min-mod A cues for 60% accuracy, level of cues dependent on alertness/fatigue. Pt re-educated on weight bearing precautions which he could recall with 5 min delay. Pt left in wheelchair with alarm belt intact and all needs within reach. Cont ST POC.  ? ?Pain ?Pain Assessment ?Pain Scale: 0-10 ?Pain Score: 0-No pain ?Pain Type: Surgical pain ?Pain Location: Foot ?Pain Orientation: Right ?Pain Descriptors / Indicators: Aching ?Pain Onset: On-going ?Patients Stated Pain Goal: 0 ?Pain Intervention(s): Medication (See eMAR) ?Multiple Pain Sites: No ? ?Therapy/Group: Individual Therapy ? ?Dewaine Conger ?05/13/2021, 10:44 AM ?

## 2021-05-13 NOTE — Progress Notes (Signed)
Physical Therapy Weekly Progress Note ? ?Patient Details  ?Name: [name] ?MRN: [id number] ?Date of Birth: 1961/01/23 ? ?Beginning of progress report period: May 07, 2021 ?End of progress report period: May 13, 2021 ? ?Today's Date: 05/13/2021 ?PT Individual Time: 1017-5102 ?PT Individual Time Calculation (min): 68 min  ? ?Patient has met 3 of 4 short term goals.  Pt is making steady progress towards LTG of supervision assist overall for transfers and gait. Pt demonstrating parkinsonian movement patterns with decreassed speed and amplitude of movement as well as intermittent resting tremor. MD aware. Currently requires min assist for transfers, CGA gait and supervision assist bed mobility with increased time. Family not present for education to this pint.  ? ?Patient continues to demonstrate the following deficits muscle weakness and muscle joint tightness, decreased cardiorespiratoy endurance, unbalanced muscle activation and motor apraxia, decreased initiation, decreased attention, decreased awareness, decreased problem solving, decreased safety awareness, decreased memory, and delayed processing, and decreased sitting balance, decreased standing balance, decreased postural control, and decreased balance strategies and therefore will continue to benefit from skilled PT intervention to increase functional independence with mobility. ? ?Patient progressing toward long term goals..  Continue plan of care. ? ?PT Short Term Goals ?Week 1:  PT Short Term Goal 1 (Week 1): pt will perform bed mobility with CGA ?PT Short Term Goal 1 - Progress (Week 1): Met ?PT Short Term Goal 2 (Week 1): Pt will trasnfer to [facility] with min assist consistently ?PT Short Term Goal 2 - Progress (Week 1): Met ?PT Short Term Goal 3 (Week 1): Pt will ambulate 169f with min assist and LRAD ?PT Short Term Goal 3 - Progress (Week 1): Met ?PT Short Term Goal 4 (Week 1): Pt will ascend 8 steps with min assist ?PT Short Term Goal 4 - Progress  (Week 1): Progressing toward goal ?Week 2:  PT Short Term Goal 1 (Week 2): Pt will ambulate 1238fwith CGA and LRAD ?PT Short Term Goal 2 (Week 2): pt will ascend  8 steps with min assist consistently ?PT Short Term Goal 3 (Week 2): [facility] ?PT Short Term Goal 4 (Week 2): Pt will propell WC 10039fith supervision assist ? ?Skilled Therapeutic Interventions/Progress Updates:  ? ?Pt received supine in bed finishing dressing change and medication administration.  Pt agreeable to PT. Supine>sit transfer with supervision assist and cues for log roll technique and use of bed rail to push to sitting. Sit<>stand from EOB with min assist from elevated height. Pt noted to have been incontinent of bladder. Sit<>stand x 3 to doff/don clean brief and pants with mod assist for clothing management. Stand [facility] and increased cues for upright posture compared to prior session. ? ?Pt transported in WC [facility] rehab gym. Sit<>stand x 5 with min progressing to supervision assist on last to attempts with cues for sequencing to initiate anterior weight shift prior to pushing into standing as well as improved UE placement and LE positioning. Pt reports R foot pain 5/10. RN made aware ? ?Gait training with RW to weave through 4 cones with CGA-supervision assist and cues for WB through R heel. Pt reports imld lightheadedness. Transfer to sitting on mat table with moderate improvement of s/s. Stand pivot to WC with CGA and RW>  PT performed orthostatic VS assessment.  ?Sitting: 122/76 HR 89  ?Standing :117/87 HR 103. No s/s. But pt reports need for urination.  ? ?Transported to  room to attempt urination. Pt unable to void in standing with urinal,, but noted to have been incontinent of bladder. RN then present for pain medication administration. Made aware of incontinence.  ? ?Seated NMR to force improved amplitude of movements to toss ball to PT x 15 and performed Rapid  lateral reaches with BUE to tap ball on target outisde BOS x 12 Bil. Cues for increased speed and ROM intermittently.  ? ?Pt left sitting in WC with call bell in reach and all needs met. .    ? ?   ? ?Therapy Documentation ?Precautions:  ?Precautions ?Precautions: Fall ?Precaution Comments: baseline R HP and cognitive deficits ?Restrictions ?Weight Bearing Restrictions: Yes ?RLE Weight Bearing: Weight bearing as tolerated ?Other Position/Activity Restrictions: heel WB with surgical shoe ?General: ?  ?Vital Signs: ?Therapy Vitals ?Temp: 98.5 ?F (36.9 ?C) ?Temp Source: Oral ?Pulse Rate: 70 ?Resp: 16 ?BP: 133/79 ?Patient Position (if appropriate): Lying ?Oxygen Therapy ?SpO2: 100 % ?O2 Device: Room Air ?Pain: ?Pain Assessment ?Pain Scale: 0-10 ?Pain Score: 5  ?Pain Type: Surgical pain ?Pain Location: Foot ?Pain Orientation: Right ?Pain Descriptors / Indicators: Aching ?Pain Onset: On-going ?Patients Stated Pain Goal: 0 ?Pain Intervention(s): Medication (See eMAR) ?Multiple Pain Sites: No ? ?Therapy/Group: Individual Therapy ? ?[name] ?05/13/2021, 8:55 AM  ?

## 2021-05-13 NOTE — Progress Notes (Signed)
Occupational Therapy Session Note ? ?Patient Details  ?Name: [name] ?MRN: [id number] ?Date of Birth: Jun 01, 1960 ? ?Today's Date: 05/13/2021 ?OT Individual Time: 0063-4949 ?OT Individual Time Calculation (min): 53 min  ? ? ?Short Term Goals: ?Week 1:  OT Short Term Goal 1 (Week 1): pt will complete >2 standing grooming task with CGA ?OT Short Term Goal 2 (Week 1): Pt will complete ambulatory toilet transfer with min A and LRAD ?OT Short Term Goal 3 (Week 1): Pt will don pants with mod A ?OT Short Term Goal 4 (Week 1): Pt will assist with donning surgical shoe wwith no more than mod A. ? ?Skilled Therapeutic Interventions/Progress Updates:  ?  Pt received seated in w/c, reports earlier R foot pain but that has since resolved with pain rx, agreeable to therapy. Session focus on BUE/core strength, activity tolerance, transfer retraining in prep for improved ADL/IADL/func mobility performance + decreased caregiver burden. Declined need for ADL. Total A w/c transport to and from gym. Short ambulatory transfers throughout session with heavy min A to power up with RW and min A to CGA for RW management, cues to WB through R heel only.  ? ?Massed practice of sit to stand from elevated > lowest height mat table. Progressed from heavy min A to close S via split hand technique. Initially heavy min A to control descent.  ? ?Pt performed 5 time sit<>stand (5xSTS): 2 min 23 seconds sec (>15 sec indicates increased fall risk). ? ?Finally, completed 1x10 of core twists, overhead reaches, and modified sit-ups with use of 4 lb medicine ball. ? ?Pt left seated in w/c with safety belt alarm engaged, call bell in reach, and all immediate needs met.  ? ? ?Therapy Documentation ?Precautions:  ?Precautions ?Precautions: Fall ?Precaution Comments: baseline R HP and cognitive deficits ?Restrictions ?Weight Bearing Restrictions: Yes ?RLE Weight Bearing: Weight bearing as tolerated ?Other Position/Activity Restrictions: heel WB with  surgical shoe ? ?Pain: see session note ?ADL: See Care Tool for more details. ? ?Therapy/Group: Individual Therapy ? ?Volanda Napoleon MS, OTR/L ? ?05/13/2021, 6:50 AM ?

## 2021-05-13 NOTE — Progress Notes (Signed)
Patient more alert during shift and talkative, encouraging and consumed over 500 cc  or po liquids during shift, had large volume of urinary output around 0100, incontinent care provided and repositioned. Up watching TV states he not tired just want to watch TV, elevated right foot on a pillow, denies pain at this time.   ?

## 2021-05-13 NOTE — Progress Notes (Signed)
[place] PHYSICAL MEDICINE & REHABILITATION PROGRESS NOTE ? ?Subjective/Complaints: ? ?Has had Right foot pain but not currently discussed prn pain meds ? ?ROS: Denies CP, SOB, N/V/D ? ? ?Objective: ?Vital Signs: ?Blood pressure 133/79, pulse 70, temperature 98.5 ?F (36.9 ?C), temperature source Oral, resp. rate 16, height 5\' 7"  (1.702 m), weight 71 kg, SpO2 100 %. ?No results found. ?No results for input(s): WBC, HGB, HCT, PLT in the last 72 hours. ? ?No results for input(s): NA, K, CL, CO2, GLUCOSE, BUN, CREATININE, CALCIUM in the last 72 hours. ? ? ?Intake/Output Summary (Last 24 hours) at 05/13/2021 0751 ?Last data filed at 05/13/2021 0050 ?Gross per 24 hour  ?Intake 917 ml  ?Output 482 ml  ?Net 435 ml  ? ?  ? ?  ? ?Physical Exam: ?BP 133/79 (BP Location: Left Arm)   Pulse 70   Temp 98.5 ?F (36.9 ?C) (Oral)   Resp 16   Ht 5\' 7"  (1.702 m)   Wt 71 kg   SpO2 100%   BMI 24.52 kg/m?  ? ?General: No acute distress ?Mood and affect are appropriate ?Heart: Regular rate and rhythm no rubs murmurs or extra sounds ?Lungs: Clear to auscultation, breathing unlabored, no rales or wheezes ?Abdomen: Positive bowel sounds, soft nontender to palpation, nondistended ? ? ?Skin: Warm and dry.  Intact.RIgh t1st ray amp site CDI no erythema ? ? ?Psych: Normal behavior.  Normal affect. ?Musc:    Right amp incision with dressing CDI, unchanged ?Neuro: 4-/5 throughout,  ?Minimal cogwheeling at elbows but not at wrist, movements bradykinetic, masked facies  ?Hand intrinsic atrophy with reduced sensation in 5th fingers bilateral , also reduced sensation feet to LT , poor concentration /distractibility limits testing  ? ?Assessment/Plan: ?1. Functional deficits which require 3+ hours per day of interdisciplinary therapy in a comprehensive inpatient rehab setting. ?Physiatrist is providing close team supervision and 24 hour management of active medical problems listed below. ?Physiatrist and rehab team continue to assess barriers to  discharge/monitor patient progress toward functional and medical goals ? ? ?Care Tool: ? ?Bathing ?   ?Body parts bathed by patient: Right arm, Left arm, Chest, Abdomen, Front perineal area, Right upper leg, Left upper leg, Face  ? Body parts bathed by helper: Right lower leg, Left lower leg, Buttocks ?  ?  ?Bathing assist Assist Level: Moderate Assistance - Patient 50 - 74% (standing for washing buttocks) ?  ?  ?Upper Body Dressing/Undressing ?Upper body dressing   ?What is the patient wearing?: Pull over shirt ?   ?Upper body assist Assist Level: Minimal Assistance - Patient > 75% ?   ?Lower Body Dressing/Undressing ?Lower body dressing ? ? ?   ?What is the patient wearing?: Underwear/pull up, Pants ? ?  ? ?Lower body assist Assist for lower body dressing: Maximal Assistance - Patient 25 - 49% ?   ? ?Toileting ?Toileting Toileting Activity did not occur (Probation officer and hygiene only): N/A (no void or bm)  ?Toileting assist Assist for toileting: Maximal Assistance - Patient 25 - 49% ?  ?  ?Transfers ?Chair/bed transfer ? ?Transfers assist ?   ? ?Chair/bed transfer assist level: Minimal Assistance - Patient > 75% ?  ?  ?Locomotion ?Ambulation ? ? ?Ambulation assist ? ?   ? ?Assist level: Minimal Assistance - Patient > 75% ?Assistive device: Walker-rolling ?Max distance: 120 ft  ? ?Walk 10 feet activity ? ? ?Assist ?   ? ?Assist level: Minimal Assistance - Patient > 75% ?Assistive device: Walker-rolling  ? ?  Walk 50 feet activity ? ? ?Assist Walk 50 feet with 2 turns activity did not occur: Safety/medical concerns ? ?Assist level: Minimal Assistance - Patient > 75% ?Assistive device: Walker-rolling  ? ? ?Walk 150 feet activity ? ? ?Assist Walk 150 feet activity did not occur: Safety/medical concerns ? ?  ?  ?  ? ?Walk 10 feet on uneven surface  ?activity ? ? ?Assist Walk 10 feet on uneven surfaces activity did not occur: Safety/medical concerns ? ? ?  ?   ? ?Wheelchair ? ? ? ? ?Assist Is the patient using a  wheelchair?: Yes ?Type of Wheelchair: Manual ?  ? ?Wheelchair assist level: Minimal Assistance - Patient > 75% ?Max wheelchair distance: 150  ? ? ?Wheelchair 50 feet with 2 turns activity ? ? ? ?Assist ? ?  ?  ? ? ?Assist Level: Minimal Assistance - Patient > 75%  ? ?Wheelchair 150 feet activity  ? ? ? ?Assist ?   ? ? ?Assist Level: Minimal Assistance - Patient > 75%  ? ? ?Medical Problem List and Plan: ?1. Functional deficits secondary to peripheral arterial disease, RIght 1st ray, 2,3,4 toe amps due to osteomyelitis with pop art stenting Right side surgery and acute pulmonary embolus. ?Continue CIR PT, OT - ELOS 5/3 ? ?2.  Antithrombotics: ?-DVT/anticoagulation:  Pharmaceutical: Other (comment) Eliquis- no sign of surgical site bleeding  ?            -antiplatelet therapy: Aspirin 81 mg daily ?3. Pain Management: Tylenol, tramadol as needed ? Monitor with increased exertion ?4. Onchomycosis of left toes: antifungal cream ordered BID.  ?5. Neuropsych: This patient is capable of making decisions on his own behalf. ?6. Right foot incision: ?            --Daily dry dressing change ?7. Fluids/Electrolytes/Nutrition: Routine I's and O's ?            -- Carb modified diet ?8: PAD: Status post Right popliteal stent placement ?            -- Continue aspirin while on Eliquis.  Stop Plavix. ?            -- Follow-up with Dr. Lucky Cowboy with ABIs ?            -- Status post right partial first ray amputation  ?            -- Right heel weightbearing with postop shoe ? ?9: prior CVA: ?10: Dyslipidemia: Continue Lipitor 40 mg daily ?11: Acute PE: Continue Eliquis ?12: Hypertension: Continue Norvasc 10 mg daily ?.cbg ? ?13: DM type II with peripheral neuropathy  Restarted metformin 250mg  daily.  ? CBG (last 3) will order , pt states he has no CBG monitor at home  ?Recent Labs  ?  05/12/21 ?1207 05/12/21 ?1658 05/12/21 ?2130  ?GLUCAP 144* 159* 188*  ? ?Changeto ac hs ? ?14: Seizure disorder maintained on Keppra: Continue 500 mg daily,  ? If PCP managed this in past  ?15: Constipation: give sorbitol times once dose now ?            --schedule senna BID, BM daily, no incont , pt does not recall  ?16: Urinary retention: d/c foley ?            -- Continue Flomax ? Improved ?17: Chronic diastolic congestive heart failure, euvolemic, no diuretics ?            -- Grade 1 diastolic dysfunction on recent echo ?18.  Hyponatremia ? Sodium 130 on 4/12, labs ordered for tomorrow ?19.  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 ?[name] [name] ?05/13/2021, 7:51 AM ? ?

## 2021-05-13 NOTE — Progress Notes (Signed)
Physical Therapy Session Note ? ?Patient Details  ?Name: [name] ?MRN: [id number] ?Date of Birth: July 08, 1960 ? ?Today's Date: 05/13/2021 ?PT Individual Time: 5800-6349 ?PT Individual Time Calculation (min): 27 min  ? ?Short Term Goals: ? ?Week 2:  PT Short Term Goal 1 (Week 2): Pt will ambulate 167f with CGA and LRAD ?PT Short Term Goal 2 (Week 2): pt will ascend  8 steps with min assist consistently ?PT Short Term Goal 3 (Week 2): [facility] ?PT Short Term Goal 4 (Week 2): Pt will propell WC 1053fwith supervision assist ?Week 3:    ? ?Skilled Therapeutic Interventions/Progress Updates:  ? ?Pt received supine in bed and agreeable to PT at bed level. PT instructed pt in supine BLE therex: SLR, hip abduction/adduction, heel slide, ankle pump, SAQ. X 10 BLE with cues for full ROM Pt requesting to eat dinner in recliner.  Supine>sit transfer with supervision assist and cues for use of RUE on bed rail to push to sitting. Stand [facility] dar[facility] on the RLE, CGA from PT from elevated bed. Pt left in recliner with call bell in reach and all needs met.  ? ?   ? ?Therapy Documentation ?Precautions:  ?Precautions ?Precautions: Fall ?Precaution Comments: baseline R HP and cognitive deficits ?Restrictions ?Weight Bearing Restrictions: Yes ?RLE Weight Bearing: Partial weight bearing ?Other Position/Activity Restrictions: heel WB with surgical shoe ? ?  ?Vital Signs: ?Therapy Vitals ?Temp: 98.3 ?F (36.8 ?C) ?Temp Source: Oral ?Pulse Rate: 82 ?Resp: 16 ?BP: 137/84 ?Patient Position (if appropriate): Lying ?Oxygen Therapy ?SpO2: 100 % ?O2 Device: Room Air ?Pain: ?denies ? ? ? ?Therapy/Group: Individual Therapy ? ?AuLorie Phenix4/21/2023, 5:12 PM  ?

## 2021-05-14 LAB — GLUCOSE, CAPILLARY
Glucose-Capillary: 108 mg/dL — ABNORMAL HIGH (ref 70–99)
Glucose-Capillary: 133 mg/dL — ABNORMAL HIGH (ref 70–99)
Glucose-Capillary: 139 mg/dL — ABNORMAL HIGH (ref 70–99)
Glucose-Capillary: 143 mg/dL — ABNORMAL HIGH (ref 70–99)
Glucose-Capillary: 191 mg/dL — ABNORMAL HIGH (ref 70–99)

## 2021-05-14 NOTE — Progress Notes (Signed)
Speech Language Pathology Daily Session Note ? ?Patient Details  ?Name: [name] ?MRN: [id number] ?Date of Birth: [birth year] ? ?Today's Date: 05/14/2021 ?SLP Individual Time: 4388-8757 ?SLP Individual Time Calculation (min): 60 min ? ?Short Term Goals: ?Week 1: SLP Short Term Goal 1 (Week 1): Patient will utilize external memory aids to recall weight bearing precautions with min A verbal cues ?SLP Short Term Goal 2 (Week 1): Patient will verbalize 2 safety precautions associated with new limitations at min A level ?SLP Short Term Goal 3 (Week 1): Patient will demonstrate functional problem solving for basic-to-mildly complex tasks with min A verbal cues. ? ?Skilled Therapeutic Interventions: ?Pt seen for skilled ST with focus on cognitive goals, pt in bed having just finished breakfast and agreeable to therapeutic tasks. Pt with vague recall of previous ST session ("we did cards") but unable to detail events of other therapy sessions. Pt requiring max A to recall memory notebook initiated yesterday, unable to read some entries. Pt continues to be disoriented to temporal concepts but benefits from external aids and cues to scan room. SLP facilitating simple problem solving task utilizing picture cards by providing overall mod A cues for 80% accuracy. Pt unable to recall weight bearing precautions, wrote in memory notebook to increase recall, carryover and utilization. Pt left in bed with alarm set and all needs within reach, cont ST POC. ? ?Pain ?Pain Assessment ?Pain Scale: 0-10 ?Pain Score: 0-No pain ? ?Therapy/Group: Individual Therapy ? ?Dewaine Conger ?05/14/2021, 8:24 AM ?

## 2021-05-14 NOTE — Progress Notes (Signed)
Occupational Therapy Weekly Progress Note ? ?Patient Details  ?Name: [name] ?MRN: [id number] ?Date of Birth: [birth year] ? ?Beginning of progress report period: May 08, 2020 ?End of progress report period: May 14, 2021 ? ?Today's Date: 05/14/2021 ?OT Individual Time: [phone] ?OT Individual Time Calculation (min): 51 min  ? ? ?Patient has met 1 of 4 short term goals.  Pt has made slow but steady progress this week towards LTG. Pt has demonstrated improved sit to stand transfers, dynamic standing balance, and activity tolerance to presently complete UB ADL at S to min A level, LB ADL continue to require max A with use of RW. Can complete ambulatory bathroom transfers with up to max A to power up from surface but only CGA when ambulating with RW. Pt cont to be primarily limited by chronic R HP, rigidity in limbs/trunk, slow motoric responses, posterior bias and difficulty adhering to R heel WB. Anticipate 24/7 S and CGA to min  physical assist required upon DC. No family present so far during OT sessions. ? ? ?Patient continues to demonstrate the following deficits: muscle weakness, decreased cardiorespiratoy endurance, impaired timing and sequencing, unbalanced muscle activation, and decreased coordination, decreased initiation, decreased attention, decreased awareness, decreased problem solving, decreased safety awareness, decreased memory, and delayed processing, and decreased sitting balance, decreased standing balance, decreased postural control, and decreased balance strategies and therefore will continue to benefit from skilled OT intervention to enhance overall performance with BADL and Reduce care partner burden. ? ?Patient progressing toward long term goals..  Continue plan of care. ? ?OT Short Term Goals ?Week 1:  OT Short Term Goal 1 (Week 1): pt will complete >2 standing grooming task with CGA ?OT Short Term Goal 1 - Progress (Week 1): Met ?OT Short Term Goal 2 (Week 1): Pt will complete  ambulatory toilet transfer with min A and LRAD ?OT Short Term Goal 2 - Progress (Week 1): Not met ?OT Short Term Goal 3 (Week 1): Pt will don pants with mod A ?OT Short Term Goal 3 - Progress (Week 1): Not met ?OT Short Term Goal 4 (Week 1): Pt will assist with donning surgical shoe wwith no more than mod A. ?OT Short Term Goal 4 - Progress (Week 1): Not met ?OT Short Term Goal 5 (Week 1): Pt will complete ambulatory toilet transfer with min A and LRAD ?Week 2:  OT Short Term Goal 1 (Week 2): Pt will assist with donning surgical shoe wwith no more than mod A. ?OT Short Term Goal 2 (Week 2): Pt will don pants with mod A ?OT Short Term Goal 3 (Week 2): Pt will don shirt with S. ? ?Skilled Therapeutic Interventions/Progress Updates:  ?   ?Pt received seated on toilet in stedy, denies pain, agreeable to therapy. Session focus on self-care retraining, activity tolerance in prep for improved ADL/IADL/func mobility performance + decreased caregiver burden. Continent void of b/b. Max A to power up in stedy and facilitaite upright posture, total A for posterior pericare. Stedy transfer > shower stool. Pt bathed LUE, anterior/posterior periarea and LLE  with S and assist to bathe R side due to poor initiation and time constraints.  ? ?Donned shirt with min A to pull down in back. Total A to don brief/pants + R darco and L shoe due to time constraints, max A to come into standing with RW with 2 attempts. ? ? ?Pt left seated in w/c with LPN present with safety belt alarm engaged, call bell in reach, and all  immediate needs met.  ? ?Therapy Documentation ?Precautions:  ?Precautions ?Precautions: Fall ?Precaution Comments: baseline R HP and cognitive deficits ?Restrictions ?Weight Bearing Restrictions: Yes ?RLE Weight Bearing: Partial weight bearing ?Other Position/Activity Restrictions: heel WB with surgical shoe ? ?Pain: ?  denies ?ADL: See Care Tool for more details. ? ? ? ?Therapy/Group: Individual Therapy ? ?Volanda Napoleon MS, OTR/L ? ?05/14/2021, 6:49 AM  ?

## 2021-05-14 NOTE — Progress Notes (Signed)
[place] PHYSICAL MEDICINE & REHABILITATION PROGRESS NOTE ? ?Subjective/Complaints: ? ?No pain- ate 100% tray.  ?Slept well ?No issues.  ? ? ?ROS:  ?Pt denies SOB, abd pain, CP, N/V/C/D, and vision changes ? ? ? ?Objective: ?Vital Signs: ?Blood pressure 127/74, pulse 72, temperature 98.1 ?F (36.7 ?C), resp. rate 14, height 5\' 7"  (1.702 m), weight 71.2 kg, SpO2 100 %. ?No results found. ?No results for input(s): WBC, HGB, HCT, PLT in the last 72 hours. ? ?No results for input(s): NA, K, CL, CO2, GLUCOSE, BUN, CREATININE, CALCIUM in the last 72 hours. ? ? ?Intake/Output Summary (Last 24 hours) at 05/14/2021 1122 ?Last data filed at 05/14/2021 4166 ?Gross per 24 hour  ?Intake 270 ml  ?Output 2 ml  ?Net 268 ml  ?  ? ?  ? ?Physical Exam: ?BP 127/74 (BP Location: Left Arm)   Pulse 72   Temp 98.1 ?F (36.7 ?C)   Resp 14   Ht 5\' 7"  (1.702 m)   Wt 71.2 kg   SpO2 100%   BMI 24.58 kg/m?  ? ? ?General: awake, alert, appropriate, lying in bed;  NAD ?HENT: conjugate gaze; oropharynx moist ?CV: regular rate; no JVD ?Pulmonary: CTA B/L; no W/R/R- good air movement ?GI: soft, NT, ND, (+)BS ?Psychiatric: appropriate ?Neurological: alert ? ?Skin: Warm and dry.  Intact.RIgh t1st ray amp site CDI no erythema ? ? ?Psych: Normal behavior.  Normal affect. ?Musc:    Right amp incision with dressing CDI, unchanged ?Neuro: 4-/5 throughout,  ?Minimal cogwheeling at elbows but not at wrist, movements bradykinetic, masked facies  ?Hand intrinsic atrophy with reduced sensation in 5th fingers bilateral , also reduced sensation feet to LT , poor concentration /distractibility limits testing  ? ?Assessment/Plan: ?1. Functional deficits which require 3+ hours per day of interdisciplinary therapy in a comprehensive inpatient rehab setting. ?Physiatrist is providing close team supervision and 24 hour management of active medical problems listed below. ?Physiatrist and rehab team continue to assess barriers to discharge/monitor patient progress  toward functional and medical goals ? ? ?Care Tool: ? ?Bathing ?   ?Body parts bathed by patient: Right arm, Left arm, Chest, Abdomen, Front perineal area, Right upper leg, Left upper leg, Face  ? Body parts bathed by helper: Right lower leg, Left lower leg, Buttocks ?  ?  ?Bathing assist Assist Level: Moderate Assistance - Patient 50 - 74% (standing for washing buttocks) ?  ?  ?Upper Body Dressing/Undressing ?Upper body dressing   ?What is the patient wearing?: Pull over shirt ?   ?Upper body assist Assist Level: Minimal Assistance - Patient > 75% ?   ?Lower Body Dressing/Undressing ?Lower body dressing ? ? ?   ?What is the patient wearing?: Underwear/pull up, Pants ? ?  ? ?Lower body assist Assist for lower body dressing: Maximal Assistance - Patient 25 - 49% ?   ? ?Toileting ?Toileting Toileting Activity did not occur (Probation officer and hygiene only): N/A (no void or bm)  ?Toileting assist Assist for toileting: Maximal Assistance - Patient 25 - 49% ?  ?  ?Transfers ?Chair/bed transfer ? ?Transfers assist ?   ? ?Chair/bed transfer assist level: Minimal Assistance - Patient > 75% ?  ?  ?Locomotion ?Ambulation ? ? ?Ambulation assist ? ?   ? ?Assist level: Minimal Assistance - Patient > 75% ?Assistive device: Walker-rolling ?Max distance: 120 ft  ? ?Walk 10 feet activity ? ? ?Assist ?   ? ?Assist level: Minimal Assistance - Patient > 75% ?Assistive device: Walker-rolling  ? ?  Walk 50 feet activity ? ? ?Assist Walk 50 feet with 2 turns activity did not occur: Safety/medical concerns ? ?Assist level: Minimal Assistance - Patient > 75% ?Assistive device: Walker-rolling  ? ? ?Walk 150 feet activity ? ? ?Assist Walk 150 feet activity did not occur: Safety/medical concerns ? ?  ?  ?  ? ?Walk 10 feet on uneven surface  ?activity ? ? ?Assist Walk 10 feet on uneven surfaces activity did not occur: Safety/medical concerns ? ? ?  ?   ? ?Wheelchair ? ? ? ? ?Assist Is the patient using a wheelchair?: Yes ?Type of Wheelchair:  Manual ?  ? ?Wheelchair assist level: Minimal Assistance - Patient > 75% ?Max wheelchair distance: 150  ? ? ?Wheelchair 50 feet with 2 turns activity ? ? ? ?Assist ? ?  ?  ? ? ?Assist Level: Minimal Assistance - Patient > 75%  ? ?Wheelchair 150 feet activity  ? ? ? ?Assist ?   ? ? ?Assist Level: Minimal Assistance - Patient > 75%  ? ? ?Medical Problem List and Plan: ?1. Functional deficits secondary to peripheral arterial disease, RIght 1st ray, 2,3,4 toe amps due to osteomyelitis with pop art stenting Right side surgery and acute pulmonary embolus. ?D/c 5/3 ?Continue CIR- PT, OT  ?2.  Antithrombotics: ?-DVT/anticoagulation:  Pharmaceutical: Other (comment) Eliquis- no sign of surgical site bleeding  ?            -antiplatelet therapy: Aspirin 81 mg daily ?3. Pain Management: Tylenol, tramadol as needed ? 4/22- pain controlled- con't regimen ? Monitor with increased exertion ?4. Onchomycosis of left toes: antifungal cream ordered BID.  ?5. Neuropsych: This patient is capable of making decisions on his own behalf. ?6. Right foot incision: ?            --Daily dry dressing change ?7. Fluids/Electrolytes/Nutrition: Routine I's and O's ?            -- Carb modified diet ?8: PAD: Status post Right popliteal stent placement ?            -- Continue aspirin while on Eliquis.  Stop Plavix. ?            -- Follow-up with Dr. Lucky Cowboy with ABIs ?            -- Status post right partial first ray amputation  ?            -- Right heel weightbearing with postop shoe ? ?9: prior CVA: ?10: Dyslipidemia: Continue Lipitor 40 mg daily ?11: Acute PE: Continue Eliquis ?12: Hypertension: Continue Norvasc 10 mg daily ?.cbg ? ?13: DM type II with peripheral neuropathy  Restarted metformin 250mg  daily.  ? CBG (last 3) will order , pt states he has no CBG monitor at home  ?Recent Labs  ?  05/13/21 ?1643 05/13/21 ?2051 05/14/21 ?6720  ?GLUCAP 105* 154* 108*  ? 4/22- CBGs controlled- con't regimen ?14: Seizure disorder maintained on Keppra:  Continue 500 mg daily, ? If PCP managed this in past  ?15: Constipation: give sorbitol times once dose now ?            --schedule senna BID, BM daily, no incont , pt does not recall  ?16: Urinary retention: d/c foley ?            -- Continue Flomax ? Improved ?17: Chronic diastolic congestive heart failure, euvolemic, no diuretics ?            -- Grade 1  diastolic dysfunction on recent echo ?18.  Hyponatremia ? Sodium 130 on 4/12, labs ordered for tomorrow ?19.  Bradykinesia , masked facies, min cogwheeling, no hx PD may need outpt neuro eval on this, no showed for appt with  Dr Jaynee Eagles  ?Hx of remote RIght subcortical infarcts which may cause a parkinson like syndrome  , seizure d/o ? ? ?LOS: 8 days ?A FACE TO FACE EVALUATION WAS PERFORMED ? ?[name] ?05/14/2021, 11:22 AM ? ?

## 2021-05-15 LAB — GLUCOSE, CAPILLARY
Glucose-Capillary: 107 mg/dL — ABNORMAL HIGH (ref 70–99)
Glucose-Capillary: 169 mg/dL — ABNORMAL HIGH (ref 70–99)
Glucose-Capillary: 142 mg/dL — ABNORMAL HIGH (ref 70–99)
Glucose-Capillary: 175 mg/dL — ABNORMAL HIGH (ref 70–99)

## 2021-05-15 NOTE — Progress Notes (Signed)
Physical Therapy Session Note ? ?Patient Details  ?Name: [name] ?MRN: [id number] ?Date of Birth: [birth year] ? ?Today's Date: 05/15/2021 ?PT Individual Time: 1000-1056 ?PT Individual Time Calculation (min): 56 min  ? ?Short Term Goals: ?Week 2:  PT Short Term Goal 1 (Week 2): Pt will ambulate 171f with CGA and LRAD ?PT Short Term Goal 2 (Week 2): pt will ascend  8 steps with min assist consistently ?PT Short Term Goal 3 (Week 2): [facility] ?PT Short Term Goal 4 (Week 2): Pt will propell WC 1068fwith supervision assist ? ?Skilled Therapeutic Interventions/Progress Updates:  ?   ?Pt presenting supine in bed resting at start of session - awakens to voice and agreeable to PT tx. Denies pain. Slow to wake with flat affect throughout session. Delayed processing as well and benefits from ++ time to initiate functional tasks.  ? ?Retrieved disposable scrub pants from utility and these were donned with modA at bed level. Pt able to roll in bed with minA with use of bed features. Supine<>sitting EOB with minA for trunk support, + time needed. Requires minA for initial sitting balance due to R and posterior lean. Donned DARCO to R foot and tennis shoe to L with totalA for time. Sit<>stand from lowered EOB with minA for powering to rise - PT stabilizing RW as pt self selecting to pull from middle of RW. Stand<>pivot transfer with CGA from EOB to w/c and then transported to main rehab gym for time. ? ?Stand<>pivot with minA and RW from w/c to EOB with instruction on split hand placement to arm rest - transferred to mat table. Worked on maPsychologist, occupationalit<>stands from mat table to RW to improve carryover of awareness of hand placement and efficiency. He progressed to supervision for sit>stands with cues and instruction but requires minA for 5th rep due to fatigue. We also worked on standing posture due to forward flexed trunk and flexed hips - able to correct with tactile  feedback.  ? ?Instructed in gait training ~6531f ~[id number] CGA/minA and RW on level surfaces - instruction to increase R heel strike during initial contact. Decreased safety awareness with turning to sit to chair due to fatigue - cues needed for bringing RW with him and for hand placement to arm rests to assist with controlling descent.  ? ?Stair training using 6inch steps and 2 hand rails - minA steadying and mod instruction cues for step-to pattern with L foot leading ascent and R foot leading descent. Also required needed cues for increased R heel WB. Completed 4 + 4 steps with seated rest break.  ? ?Returned to room in w/c and pt requesting to remain seated in w/c at end of session. Safety belt alarm on, call bell in lap, all needs met. NT notified of pt position. ? ?Therapy Documentation ?Precautions:  ?Precautions ?Precautions: Fall ?Precaution Comments: baseline R HP and cognitive deficits ?Restrictions ?Weight Bearing Restrictions: Yes ?RLE Weight Bearing: Partial weight bearing ?Other Position/Activity Restrictions: heel WB with surgical shoe ?General: ?  ? ?Therapy/Group: Individual Therapy ? ?Yarah Fuente P Vennie Waymire ?05/15/2021, 7:27 AM  ?

## 2021-05-16 LAB — GLUCOSE, CAPILLARY
Glucose-Capillary: 119 mg/dL — ABNORMAL HIGH (ref 70–99)
Glucose-Capillary: 133 mg/dL — ABNORMAL HIGH (ref 70–99)
Glucose-Capillary: 146 mg/dL — ABNORMAL HIGH (ref 70–99)
Glucose-Capillary: 167 mg/dL — ABNORMAL HIGH (ref 70–99)

## 2021-05-16 LAB — BASIC METABOLIC PANEL
Anion gap: 7 (ref 5–15)
BUN: 22 mg/dL — ABNORMAL HIGH (ref 6–20)
CO2: 26 mmol/L (ref 22–32)
Calcium: 9 mg/dL (ref 8.9–10.3)
Chloride: 100 mmol/L (ref 98–111)
Creatinine, Ser: 0.94 mg/dL (ref 0.61–1.24)
GFR, Estimated: >60 mL/min (ref >60)
Glucose, Bld: 111 mg/dL — ABNORMAL HIGH (ref 70–99)
Potassium: 4.1 mmol/L (ref 3.5–5.1)
Sodium: 133 mmol/L — ABNORMAL LOW (ref 135–145)

## 2021-05-16 NOTE — Progress Notes (Signed)
Physical Therapy Session Note ? ?Patient Details  ?Name: [name] ?MRN: [id number] ?Date of Birth: [birth year] ? ?Today's Date: 05/16/2021 ?PT Individual Time: 9622-2979 ?PT Individual Time Calculation (min): 59 min  ? ?Short Term Goals: ?Week 1:  PT Short Term Goal 1 (Week 1): pt will perform bed mobility with CGA ?PT Short Term Goal 1 - Progress (Week 1): Met ?PT Short Term Goal 2 (Week 1): Pt will trasnfer to [facility] with min assist consistently ?PT Short Term Goal 2 - Progress (Week 1): Met ?PT Short Term Goal 3 (Week 1): Pt will ambulate 156f with min assist and LRAD ?PT Short Term Goal 3 - Progress (Week 1): Met ?PT Short Term Goal 4 (Week 1): Pt will ascend 8 steps with min assist ?PT Short Term Goal 4 - Progress (Week 1): Progressing toward goal ?Week 2:  PT Short Term Goal 1 (Week 2): Pt will ambulate 1248fwith CGA and LRAD ?PT Short Term Goal 2 (Week 2): pt will ascend  8 steps with min assist consistently ?PT Short Term Goal 3 (Week 2): [facility] ?PT Short Term Goal 4 (Week 2): Pt will propell WC 10073fith supervision assist ? ? ?Skilled Therapeutic Interventions/Progress Updates:  ?Patient asleep while reclined back in recliner on entrance to room. Time required to fully wake, become alert and then agreeable to PT session.  ? ?Patient with no pain complaint throughout session. ? ?Therapeutic Activity: ?Transfers: Patient performed sit<>stand transfers requiring MinA and up to ModEsterbrookmproved initiation requiring less time to initiate, and improved forward lean, but continues to produce posterior push/ bias into standing then physical assist required to overcome into upright balance. Once standing, pt able to step pivot with CGA. Provided verbal cues for technique need to acquire balance throughout. ? ?Gait Training:  ?Patient ambulated about room and 160 ft using RW with CGA and w/c follow for fatigue/ safety. Provided vc/ tc for upright posture, level gaze, and  to adequately maneuver obstacles with appropriate walker mgmt. ? ?Pt guided in stair training with physical demonstration and verbal instructions provided prior to performance. Pt is able to complete four 6" steps using BHR with MinA to ascend leading with LLE and CGA/ MinA to descend leading with RLE. VC provided at initiation of each direction for leading LE. Pt then guided in stair negotiatin in stairwell and completes 11 steps using RHR to ascend and LHR to descend with ipsilateral UE supported on therapist's arm. Min/ ModA required on ascent for improving forward lean into ascent of step as pt's hip remain behind feet with posterior bias in balance. Cued for sequencing hands and feet throughout. Seated rest break at top of steps.  ? ? ?Neuromuscular Re-ed: ?NMR facilitated during session with focus on single leg knee extension and stability as well as contralateral toe touches to first then second, then called step with colored disc targets. Performed with improved coordination. NMR performed for improvements in motor control and coordination, balance, sequencing, judgement, and self confidence/ efficacy in performing all aspects of mobility at highest level of independence.  ? ?Patient seated upright  in recliner at end of session with brakes locked, belt alarm set, and all needs within reach. Lunch arriving. NT having setup pt. ? ? ?Therapy Documentation ?Precautions:  ?Precautions ?Precautions: Fall ?Precaution Comments: baseline R HP and cognitive deficits ?Restrictions ?Weight Bearing Restrictions: Yes ?RLE Weight Bearing: Partial weight bearing ?Other Position/Activity Restrictions: heel WB with surgical shoe ?General: ?  ?  Vital Signs: ?Therapy Vitals ?Temp: 98.2 ?F (36.8 ?C) ?Temp Source: Oral ?Pulse Rate: 73 ?Resp: 16 ?BP: 132/80 ?Patient Position (if appropriate): Lying ?Oxygen Therapy ?SpO2: 100 % ?O2 Device: Room Air ?Pain: ? No pain complaint this session.  ? ? ?Therapy/Group: Individual  Therapy ? ?Alger Simons PT, DPT ?05/16/2021, 6:06 PM  ?

## 2021-05-16 NOTE — Progress Notes (Signed)
Occupational Therapy Session Note ? ?Patient Details  ?Name: [name] ?MRN: [id number] ?Date of Birth: December 31, 1960 ? ?Today's Date: 05/16/2021 ?OT Individual Time: 3329-5188 ?OT Individual Time Calculation (min): 71 min  ? ? ?Short Term Goals: ?Week 2:  OT Short Term Goal 1 (Week 2): Pt will assist with donning surgical shoe wwith no more than mod A. ?OT Short Term Goal 2 (Week 2): Pt will don pants with mod A ?OT Short Term Goal 3 (Week 2): Pt will don shirt with S. ? ?Skilled Therapeutic Interventions/Progress Updates:  ? Pt greeted supine in bed    agreeable to OT intervention. Session focus on BADL reeducation, functional mobility, dynamic standing balance, functional sit>stands, increasing overall activity tolerance/ endurance and decreasing overall caregiver burden.        ?Pt completed supine>sit with CGA with increased time and effort, total A to don shoe/darco shoe from EOB, MIN A for sit>stand from EOB with + time and effort. CGA to pivot to w/c. Pt completed wash up at sink with overall MINA. Set- up to don [place] shirt, MOD A to don pants with pt needing assist to thread pants and pull up to waist line on R side. Pt transported to gym with total A where pt worked on sit>stands via therapeutic activity of engaging in matching game. Pt instructed to sit>stand every time it was pts turn to flip over 2 blocks to find a match. Pts stand started out being a light MIN A but progressed to [place] with blocked practice. ?Pt able to ambulate all the way back to room with rw and CGA with chair follow. VSS on RA after functional ambulation to room. pt left seated in recliner with alarm belt activated and all needs within reach.                      ?Therapy Documentation ?Precautions:  ?Precautions ?Precautions: Fall ?Precaution Comments: baseline R HP and cognitive deficits ?Restrictions ?Weight Bearing Restrictions: Yes ?RLE Weight Bearing: Partial weight bearing ?Other Position/Activity Restrictions: heel WB with  surgical shoe ? ?Pain: no pain reported during session  ? ? ? ?Therapy/Group: Individual Therapy ? ?Precious Haws ?05/16/2021, 9:01 AM ?

## 2021-05-16 NOTE — Progress Notes (Addendum)
Patient ID: [name], male   DOB: Jun 26, 1960, 60 y.o.   MRN: [id number] ? ?HH referral sent to [facility] ?-Patient approved ?Orders faxed  ?

## 2021-05-16 NOTE — Progress Notes (Signed)
Speech Language Pathology Daily Session Note ? ?Patient Details  ?Name: [name] ?MRN: [id number] ?Date of Birth: 16-May-1960 ? ?Today's Date: 05/16/2021 ?SLP Individual Time: 1002-1100 ?SLP Individual Time Calculation (min): 58 min ? ?Short Term Goals: ?Week 1: SLP Short Term Goal 1 (Week 1): Patient will utilize external memory aids to recall weight bearing precautions with min A verbal cues ?SLP Short Term Goal 2 (Week 1): Patient will verbalize 2 safety precautions associated with new limitations at min A level ?SLP Short Term Goal 3 (Week 1): Patient will demonstrate functional problem solving for basic-to-mildly complex tasks with min A verbal cues. ? ?Skilled Therapeutic Interventions:Skilled ST services focused on cognitive skills. Pt demonstrated difficulty reading in memory notebook when directed due to complexity of syntax, however when SLP rewrote into simple/short phrases pt only required supervision A verbal cues.SLP updated memory notebook sign to remind staff to simplify syntax. Pt was able to verbalize x1 safety precaution (weight bearing precautions) with supervision A verbal cues. SLP facilitated basic problem solving and recall in card sorting task by 6 colors and then 5 shapes, demonstrating mod I with extra time. Pt required initial supervision A verbal cues for problem solving and recall in novel card task "Blink" however as the task continued required max A verbal cues due to fading recall/mental fatigue. Pt was left in room with call bell within reach and chair alarm set. SLP recommends to continue skilled services. ?   ? ?Pain ?Pain Assessment ?Pain Score: 0-No pain ? ?Therapy/Group: Individual Therapy ? ?Emmajane Altamura ?05/16/2021, 1:32 PM ?

## 2021-05-16 NOTE — Progress Notes (Signed)
[place] PHYSICAL MEDICINE & REHABILITATION PROGRESS NOTE ? ?Subjective/Complaints: ? ?No foot pain , pt asking about d/c date , pt oriented to day and month but not date  ? ?ROS: Denies CP, SOB, N/V/D ? ? ?Objective: ?Vital Signs: ?Blood pressure 125/80, pulse 69, temperature 98.1 ?F (36.7 ?C), temperature source Oral, resp. rate 18, height 5\' 7"  (1.702 m), weight 71.2 kg, SpO2 100 %. ?No results found. ?No results for input(s): WBC, HGB, HCT, PLT in the last 72 hours. ? ?Recent Labs  ?  05/16/21 ?0613  ?NA 133*  ?K 4.1  ?CL 100  ?CO2 26  ?GLUCOSE 111*  ?BUN 22*  ?CREATININE 0.94  ?CALCIUM 9.0  ? ? ? ?Intake/Output Summary (Last 24 hours) at 05/16/2021 0833 ?Last data filed at 05/16/2021 0755 ?Gross per 24 hour  ?Intake 240 ml  ?Output 300 ml  ?Net -60 ml  ? ?  ? ?  ? ?Physical Exam: ?BP 125/80 (BP Location: Left Arm)   Pulse 69   Temp 98.1 ?F (36.7 ?C) (Oral)   Resp 18   Ht 5\' 7"  (1.702 m)   Wt 71.2 kg   SpO2 100%   BMI 24.58 kg/m?  ? ?General: No acute distress ?Mood and affect are appropriate ?Heart: Regular rate and rhythm no rubs murmurs or extra sounds ?Lungs: Clear to auscultation, breathing unlabored, no rales or wheezes ?Abdomen: Positive bowel sounds, soft nontender to palpation, nondistended ? ? ?Skin: Warm and dry.  Intact.RIgh t1st ray amp site CDI no erythema ? ?4/24 foot looks identical today  ?Psych: Normal behavior.  Normal affect. ?Musc:    Right amp incision with dressing CDI, unchanged ?Neuro: 4-/5 throughout,  ?Minimal cogwheeling at elbows but not at wrist, movements bradykinetic, masked facies  ?Hand intrinsic atrophy with reduced sensation in 5th fingers bilateral , also reduced sensation feet to LT , poor concentration /distractibility limits testing  ? ?Assessment/Plan: ?1. Functional deficits which require 3+ hours per day of interdisciplinary therapy in a comprehensive inpatient rehab setting. ?Physiatrist is providing close team supervision and 24 hour management of active  medical problems listed below. ?Physiatrist and rehab team continue to assess barriers to discharge/monitor patient progress toward functional and medical goals ? ? ?Care Tool: ? ?Bathing ?   ?Body parts bathed by patient: Right arm, Left arm, Chest, Abdomen, Front perineal area, Right upper leg, Left upper leg, Face  ? Body parts bathed by helper: Right lower leg, Left lower leg, Buttocks ?  ?  ?Bathing assist Assist Level: Moderate Assistance - Patient 50 - 74% (standing for washing buttocks) ?  ?  ?Upper Body Dressing/Undressing ?Upper body dressing   ?What is the patient wearing?: Pull over shirt ?   ?Upper body assist Assist Level: Minimal Assistance - Patient > 75% ?   ?Lower Body Dressing/Undressing ?Lower body dressing ? ? ?   ?What is the patient wearing?: Underwear/pull up, Pants ? ?  ? ?Lower body assist Assist for lower body dressing: Maximal Assistance - Patient 25 - 49% ?   ? ?Toileting ?Toileting Toileting Activity did not occur (Probation officer and hygiene only): N/A (no void or bm)  ?Toileting assist Assist for toileting: Maximal Assistance - Patient 25 - 49% ?  ?  ?Transfers ?Chair/bed transfer ? ?Transfers assist ?   ? ?Chair/bed transfer assist level: Minimal Assistance - Patient > 75% ?  ?  ?Locomotion ?Ambulation ? ? ?Ambulation assist ? ?   ? ?Assist level: Minimal Assistance - Patient > 75% ?Assistive device:  Walker-rolling ?Max distance: 120 ft  ? ?Walk 10 feet activity ? ? ?Assist ?   ? ?Assist level: Minimal Assistance - Patient > 75% ?Assistive device: Walker-rolling  ? ?Walk 50 feet activity ? ? ?Assist Walk 50 feet with 2 turns activity did not occur: Safety/medical concerns ? ?Assist level: Minimal Assistance - Patient > 75% ?Assistive device: Walker-rolling  ? ? ?Walk 150 feet activity ? ? ?Assist Walk 150 feet activity did not occur: Safety/medical concerns ? ?  ?  ?  ? ?Walk 10 feet on uneven surface  ?activity ? ? ?Assist Walk 10 feet on uneven surfaces activity did not occur:  Safety/medical concerns ? ? ?  ?   ? ?Wheelchair ? ? ? ? ?Assist Is the patient using a wheelchair?: Yes ?Type of Wheelchair: Manual ?  ? ?Wheelchair assist level: Minimal Assistance - Patient > 75% ?Max wheelchair distance: 150  ? ? ?Wheelchair 50 feet with 2 turns activity ? ? ? ?Assist ? ?  ?  ? ? ?Assist Level: Minimal Assistance - Patient > 75%  ? ?Wheelchair 150 feet activity  ? ? ? ?Assist ?   ? ? ?Assist Level: Minimal Assistance - Patient > 75%  ? ? ?Medical Problem List and Plan: ?1. Functional deficits secondary to peripheral arterial disease, RIght 1st ray, 2,3,4 toe amps due to osteomyelitis with pop art stenting Right side surgery and acute pulmonary embolus. ?Continue CIR PT, OT - ELOS 5/3 ? ?2.  Antithrombotics: ?-DVT/anticoagulation:  Pharmaceutical: Other (comment) Eliquis- no sign of surgical site bleeding  ?            -antiplatelet therapy: Aspirin 81 mg daily ?3. Pain Management: Tylenol, tramadol as needed ? Monitor with increased exertion ?4. Onchomycosis of left toes: antifungal cream ordered BID.  ?5. Neuropsych: This patient is capable of making decisions on his own behalf. ?6. Right foot incision: ?            --Daily dry dressing change ?7. Fluids/Electrolytes/Nutrition: Routine I's and O's ?            -- Carb modified diet ?8: PAD: Status post Right popliteal stent placement ?            -- Continue aspirin while on Eliquis.  Stop Plavix. ?            -- Follow-up with Dr. Lucky Cowboy with ABIs ?            -- Status post right partial first ray amputation  ?            -- Right heel weightbearing with postop shoe ? ?9: prior CVA: ?10: Dyslipidemia: Continue Lipitor 40 mg daily ?11: Acute PE: Continue Eliquis ?12: Hypertension: Continue Norvasc 10 mg daily ?Vitals:  ? 05/15/21 2040 05/16/21 0405  ?BP: 135/79 125/80  ?Pulse: 75 69  ?Resp: 20 18  ?Temp: 98.6 ?F (37 ?C) 98.1 ?F (36.7 ?C)  ?SpO2: 100% 100%  ?  ? ?13: DM type II with peripheral neuropathy  Restarted metformin 250mg  daily.  ? CBG  (last 3) will order , pt states he has no CBG monitor at home  ?Recent Labs  ?  05/15/21 ?1651 05/15/21 ?2156 05/16/21 ?0613  ?GLUCAP 142* 175* 119*  ? ?Changeto ac hs ? ?14: Seizure disorder maintained on Keppra: Continue 500 mg daily, ? If PCP managed this in past  ?15: Constipation: give sorbitol times once dose now ?            --  schedule senna BID, BM daily, no incont , pt does not recall  ?16: Urinary retention: d/c foley ?            -- Continue Flomax ? Improved ?17: Chronic diastolic congestive heart failure, euvolemic, no diuretics ?            -- Grade 1 diastolic dysfunction on recent echo ?18.  Hyponatremia ? Sodium 130 on 4/12, labs ordered for tomorrow ?19.  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 ?Luanna Salk Deforest Maiden ?05/16/2021, 8:33 AM ? ?

## 2021-05-16 NOTE — Progress Notes (Signed)
Physical Therapy Session Note ? ?Patient Details  ?Name: [name] ?MRN: [id number] ?Date of Birth: May 26, 1960 ? ?Today's Date: 05/16/2021 ?PT Individual Time: 7672-0947 ?PT Individual Time Calculation (min): 24 min  ? ?Short Term Goals: ?Week 2:  PT Short Term Goal 1 (Week 2): Pt will ambulate 135ft with CGA and LRAD ?PT Short Term Goal 2 (Week 2): pt will ascend  8 steps with min assist consistently ?PT Short Term Goal 3 (Week 2): Pt will transfer to and from [facility] with CGA consistently ?PT Short Term Goal 4 (Week 2): Pt will propell WC 118ft with supervision assist ? ?Skilled Therapeutic Interventions/Progress Updates:  ?   ? ?Pt sleeping in bed to start session - awakens easily to voice and is agreeable to PT tx - pt pleasantly requesting to complete session bed level due to fatigue. PT instructed in supine BLE there-ex: hip abd/add, ankle pumps, heel slides, glut sets, 1x15 BLE with cues for full ROM and effort. We then worked on functional bed mobility rolling in bed with HOB flat and no bed rails - increased difficulty rolling towards his R > L, minA for R and CGA for L. 1x10 each direction. Pt slow to initiate and benefited from setupA for log rolling technique. Pt remained semi-reclined in bed at end of session, bed alarm on, call bell in reach. ? ? ?Therapy Documentation ?Precautions:  ?Precautions ?Precautions: Fall ?Precaution Comments: baseline R HP and cognitive deficits ?Restrictions ?Weight Bearing Restrictions: Yes ?RLE Weight Bearing: Partial weight bearing ?Other Position/Activity Restrictions: heel WB with surgical shoe ?General: ?  ? ? ?Therapy/Group: Individual Therapy ? ?[name] P Constance Whittle ?05/16/2021, 7:30 AM  ?

## 2021-05-17 LAB — GLUCOSE, CAPILLARY
Glucose-Capillary: 96 mg/dL (ref 70–99)
Glucose-Capillary: 129 mg/dL — ABNORMAL HIGH (ref 70–99)
Glucose-Capillary: 329 mg/dL — ABNORMAL HIGH (ref 70–99)
Glucose-Capillary: 141 mg/dL — ABNORMAL HIGH (ref 70–99)
Glucose-Capillary: 208 mg/dL — ABNORMAL HIGH (ref 70–99)

## 2021-05-17 NOTE — Progress Notes (Signed)
[name] PHYSICAL MEDICINE & REHABILITATION PROGRESS NOTE ? ?Subjective/Complaints: ? ?No issues overnite  ?Pt doing well with pain control ?Remembers me as his doctor but not by name  ? ?ROS: Denies CP, SOB, N/V/D ? ? ?Objective: ?Vital Signs: ?Blood pressure 127/82, pulse 73, temperature 98.4 ?F (36.9 ?C), temperature source Oral, resp. rate 20, height 5\' 7"  (1.702 m), weight 71.2 kg, SpO2 100 %. ?No results found. ?No results for input(s): WBC, HGB, HCT, PLT in the last 72 hours. ? ?Recent Labs  ?  05/16/21 ?0613  ?NA 133*  ?K 4.1  ?CL 100  ?CO2 26  ?GLUCOSE 111*  ?BUN 22*  ?CREATININE 0.94  ?CALCIUM 9.0  ? ? ? ?Intake/Output Summary (Last 24 hours) at 05/17/2021 0749 ?Last data filed at 05/17/2021 0200 ?Gross per 24 hour  ?Intake 720 ml  ?Output 0 ml  ?Net 720 ml  ? ?  ? ?  ? ?Physical Exam: ?BP 127/82 (BP Location: Left Arm)   Pulse 73   Temp 98.4 ?F (36.9 ?C) (Oral)   Resp 20   Ht 5\' 7"  (1.702 m)   Wt 71.2 kg   SpO2 100%   BMI 24.58 kg/m?  ? ?General: No acute distress ?Mood and affect are appropriate ?Heart: Regular rate and rhythm no rubs murmurs or extra sounds ?Lungs: Clear to auscultation, breathing unlabored, no rales or wheezes ?Abdomen: Positive bowel sounds, soft nontender to palpation, nondistended ? ? ?Skin: Warm and dry.  Intact.RIgh t1st ray amp site CDI no erythema ? ? ?Psych: Normal behavior.  Normal affect. ?Musc:    Right amp incision with dressing CDI, unchanged ?Neuro: 4-/5 throughout,  ?Minimal cogwheeling at elbows but not at wrist, movements bradykinetic, masked facies  ?Hand intrinsic atrophy with reduced sensation in 5th fingers bilateral , also reduced sensation feet to LT , poor concentration /distractibility limits testing  ? ?Assessment/Plan: ?1. Functional deficits which require 3+ hours per day of interdisciplinary therapy in a comprehensive inpatient rehab setting. ?Physiatrist is providing close team supervision and 24 hour management of active medical problems listed  below. ?Physiatrist and rehab team continue to assess barriers to discharge/monitor patient progress toward functional and medical goals ? ? ?Care Tool: ? ?Bathing ?   ?Body parts bathed by patient: Right arm, Left arm, Chest, Abdomen, Front perineal area, Right upper leg, Left upper leg, Face  ? Body parts bathed by helper: Right lower leg, Left lower leg, Buttocks ?  ?  ?Bathing assist Assist Level: Moderate Assistance - Patient 50 - 74% (standing for washing buttocks) ?  ?  ?Upper Body Dressing/Undressing ?Upper body dressing   ?What is the patient wearing?: Pull over shirt ?   ?Upper body assist Assist Level: Minimal Assistance - Patient > 75% ?   ?Lower Body Dressing/Undressing ?Lower body dressing ? ? ?   ?What is the patient wearing?: Underwear/pull up, Pants ? ?  ? ?Lower body assist Assist for lower body dressing: Maximal Assistance - Patient 25 - 49% ?   ? ?Toileting ?Toileting Toileting Activity did not occur (Probation officer and hygiene only): N/A (no void or bm)  ?Toileting assist Assist for toileting: Maximal Assistance - Patient 25 - 49% ?  ?  ?Transfers ?Chair/bed transfer ? ?Transfers assist ?   ? ?Chair/bed transfer assist level: Contact Guard/Touching assist ?  ?  ?Locomotion ?Ambulation ? ? ?Ambulation assist ? ?   ? ?Assist level: Minimal Assistance - Patient > 75% ?Assistive device: Walker-rolling ?Max distance: 120 ft  ? ?Walk  10 feet activity ? ? ?Assist ?   ? ?Assist level: Minimal Assistance - Patient > 75% ?Assistive device: Walker-rolling  ? ?Walk 50 feet activity ? ? ?Assist Walk 50 feet with 2 turns activity did not occur: Safety/medical concerns ? ?Assist level: Minimal Assistance - Patient > 75% ?Assistive device: Walker-rolling  ? ? ?Walk 150 feet activity ? ? ?Assist Walk 150 feet activity did not occur: Safety/medical concerns ? ?  ?  ?  ? ?Walk 10 feet on uneven surface  ?activity ? ? ?Assist Walk 10 feet on uneven surfaces activity did not occur: Safety/medical concerns ? ? ?   ?   ? ?Wheelchair ? ? ? ? ?Assist Is the patient using a wheelchair?: Yes ?Type of Wheelchair: Manual ?  ? ?Wheelchair assist level: Minimal Assistance - Patient > 75% ?Max wheelchair distance: 150  ? ? ?Wheelchair 50 feet with 2 turns activity ? ? ? ?Assist ? ?  ?  ? ? ?Assist Level: Minimal Assistance - Patient > 75%  ? ?Wheelchair 150 feet activity  ? ? ? ?Assist ?   ? ? ?Assist Level: Minimal Assistance - Patient > 75%  ? ? ?Medical Problem List and Plan: ?1. Functional deficits secondary to peripheral arterial disease, RIght 1st ray, 2,3,4 toe amps due to osteomyelitis with pop art stenting Right side surgery and acute pulmonary embolus. ?Continue CIR PT, OT - ELOS 5/3 ?Team conf in am  ?2.  Antithrombotics: ?-DVT/anticoagulation:  Pharmaceutical: had PE now on Eliquis- no sign of surgical site bleeding  ?            -antiplatelet therapy: Aspirin 81 mg daily ?3. Pain Management: Tylenol, tramadol as needed ? Monitor with increased exertion ?4. Onchomycosis of left toes: antifungal cream ordered BID.  ?5. Neuropsych: This patient is capable of making decisions on his own behalf. ?6. Right foot incision: ?            --Daily dry dressing change ?7. Fluids/Electrolytes/Nutrition: Routine I's and O's ?            -- Carb modified diet ?8: PAD: Status post Right popliteal stent placement ?            -- Continue aspirin while on Eliquis.  Stop Plavix. ?            -- Follow-up with Dr. Lucky [name] with ABIs ?            -- Status post right partial first ray amputation  ?            -- Right heel weightbearing with postop shoe ? ?9: prior CVA: ?10: Dyslipidemia: Continue Lipitor 40 mg daily ?11: Acute PE: Continue Eliquis ?12: Hypertension: Continue Norvasc 10 mg daily ?Vitals:  ? 05/16/21 2014 05/17/21 0450  ?BP: 125/80 127/82  ?Pulse: 77 73  ?Resp: 16 20  ?Temp: 98.5 ?F (36.9 ?C) 98.4 ?F (36.9 ?C)  ?SpO2: 100% 100%  ? Controlled 4/25 ? ?13: DM type II with peripheral neuropathy  Restarted metformin 250mg  daily.  ? CBG  (last 3) will order , pt states he has no CBG monitor at home  ?Recent Labs  ?  05/16/21 ?1630 05/16/21 ?2142 05/17/21 ?0624  ?GLUCAP 146* 167* 96  ? ?Controlled 4/25 ? ?14: Seizure disorder maintained on Keppra: Continue 500 mg daily, ? If PCP managed this in past  ?15: Constipation: give sorbitol times once dose now ?            --schedule  senna BID, BM daily, no incont , pt does not recall  ?16: Urinary retention: d/c foley ?            -- Continue Flomax ? Improved ?17: Chronic diastolic congestive heart failure, euvolemic, no diuretics ?            -- Grade 1 diastolic dysfunction on recent echo ?18.  Hyponatremia ? Sodium 130 on 4/12, labs ordered for tomorrow ?19.  Bradykinesia , masked facies, min cogwheeling, no hx PD , no showed for appt with  Dr Jaynee Eagles- would rec f/u post hospitalization   ?Hx of remote RIght subcortical infarcts which may cause a parkinson like syndrome  , seizure d/o ?LOS: 11 days ?A FACE TO FACE EVALUATION WAS PERFORMED ? ?[name] ?05/17/2021, 7:49 AM ? ?

## 2021-05-17 NOTE — Progress Notes (Signed)
Patient ID: [name], male   DOB: 09/20/1960, 61 y.o.   MRN: [id number] ? ?Wheelchair ordered through [place]  ?

## 2021-05-17 NOTE — Progress Notes (Signed)
Occupational Therapy Session Note ? ?Patient Details  ?Name: [name] ?MRN: [id number] ?Date of Birth: February 27, 1960 ? ?Today's Date: 05/17/2021 ?OT Individual Time: 1002-1110 ?OT Individual Time Calculation (min): 68 min  ? ? ?Short Term Goals: ?Week 1:  OT Short Term Goal 1 (Week 1): pt will complete >2 standing grooming task with CGA ?OT Short Term Goal 1 - Progress (Week 1): Met ?OT Short Term Goal 2 (Week 1): Pt will complete ambulatory toilet transfer with min A and LRAD ?OT Short Term Goal 2 - Progress (Week 1): Not met ?OT Short Term Goal 3 (Week 1): Pt will don pants with mod A ?OT Short Term Goal 3 - Progress (Week 1): Not met ?OT Short Term Goal 4 (Week 1): Pt will assist with donning surgical shoe wwith no more than mod A. ?OT Short Term Goal 4 - Progress (Week 1): Not met ?OT Short Term Goal 5 (Week 1): Pt will complete ambulatory toilet transfer with min A and LRAD ?Week 2:  OT Short Term Goal 1 (Week 2): Pt will assist with donning surgical shoe wwith no more than mod A. ?OT Short Term Goal 2 (Week 2): Pt will don pants with mod A ?OT Short Term Goal 3 (Week 2): Pt will don shirt with S. ? ?Skilled Therapeutic Interventions/Progress Updates:  ?  Pt received seated in recliner, no c/o pain and declines need for ADL, agreeable to therapy. Session focus on self-care retraining, activity tolerance, transfer retraining in prep for improved ADL/IADL/func mobility performance + decreased caregiver burden. ? ?Min A to power up and overcome posterior bias with RW into standing > CGA to ambulate over to w/c at door. Total w/c transport ot and from gym. ? ?Pt stood to participate in various activities reaching for and placing targets from outside BOS and above head height. Close S for balance throughout and to adhere to R H WB only. Pt did require 1 set of cues to identify error correction with matching cards while standing (mismatched suits.) ? ?Completed 5 Sit to stands from slightly elevated height in 2 min  57 seconds, increased from last week at 2 min 23 seconds but only required S assist this date!  (>15 sec indicates increased fall risk) ? ? ?Finally, trialled use of reacher to pull up red theraband over BUE in prep for improved independence with LBE. On first trial, pt required mod A due to strong posterior bias with no BUE support. On second attempt and focusing on onlying doing 1 LE at a time, pt able to thread with only light min A and CGA for balance. ? ?Pt able to recall two activities (practiced putting pants on, matching cards) for therapist to write in memory notebook.  ?Set-up A to eat cup of peaches in room. ?Pt left seated in recliner with safety belt alarm engaged, call bell in reach, and all immediate needs met.  ? ? ?Therapy Documentation ?Precautions:  ?Precautions ?Precautions: Fall ?Precaution Comments: baseline R HP and cognitive deficits ?Restrictions ?Weight Bearing Restrictions: Yes ?RLE Weight Bearing: Partial weight bearing ?Other Position/Activity Restrictions: heel WB with surgical shoe ? ?Pain: no c/o throughout ?  ?ADL: See Care Tool for more details. ? ?Therapy/Group: Individual Therapy ? ?Volanda Napoleon MS, OTR/L ? ?05/17/2021, 6:49 AM ?

## 2021-05-17 NOTE — Progress Notes (Signed)
Speech Language Pathology Daily Session Note ? ?Patient Details  ?Name: [name] ?MRN: [id number] ?Date of Birth: [birth year] ? ?Today's Date: 05/17/2021 ?SLP Individual Time: 0905-1000 ?SLP Individual Time Calculation (min): 55 min ? ?Short Term Goals: ?Week 1: SLP Short Term Goal 1 (Week 1): Patient will utilize external memory aids to recall weight bearing precautions with min A verbal cues ?SLP Short Term Goal 2 (Week 1): Patient will verbalize 2 safety precautions associated with new limitations at min A level ?SLP Short Term Goal 3 (Week 1): Patient will demonstrate functional problem solving for basic-to-mildly complex tasks with min A verbal cues. ? ?Skilled Therapeutic Interventions: ?Pt seen for skilled ST with focus on cognitive goals, pt upright in recliner and agreeable to therapeutic tasks. SLP facilitating simple medication task by providing pt with current medication list, pt reports utilizing pill box in the past but wasn't anymore before hospitalization. Pt able to ID if meds on list were part of home medication regime ~50% of the time. Pt able to ID what medications treat for 2 medications on list out of 9, no awareness of others. SLP facilitating TID pillbox organization with 3 medications from list (1x/day, 2x/day and 3x/day) which patient required significant extra time and mod A cues to complete. Pt demonstrates errors in number of pills per square as well as correct time of day sorting (AM vs PM). SLP educating patient on having sister or brother be responsible for medication organization/set up at home which he is agreeable to. Pt left in recliner with alarm belt activated and all needs within reach awaiting next therapy session. Cont ST POC. ? ?Pain ?Pain Assessment ?Pain Scale: 0-10 ?Pain Score: 0-No pain ? ?Therapy/Group: Individual Therapy ? ?Dewaine Conger ?05/17/2021, 10:00 AM ?

## 2021-05-17 NOTE — Progress Notes (Signed)
Physical Therapy Session Note ? ?Patient Details  ?Name: [name] ?MRN: [id number] ?Date of Birth: Feb 08, 1960 ? ?Today's Date: 05/17/2021 ?PT Individual Time: 7225-7505 and 1833-5825 ?PT Individual Time Calculation (min): 58 min and 26 min ? ?Short Term Goals: ?Week 1:  PT Short Term Goal 1 (Week 1): pt will perform bed mobility with CGA ?PT Short Term Goal 1 - Progress (Week 1): Met ?PT Short Term Goal 2 (Week 1): Pt will trasnfer to [facility] with min assist consistently ?PT Short Term Goal 2 - Progress (Week 1): Met ?PT Short Term Goal 3 (Week 1): Pt will ambulate 167f with min assist and LRAD ?PT Short Term Goal 3 - Progress (Week 1): Met ?PT Short Term Goal 4 (Week 1): Pt will ascend 8 steps with min assist ?PT Short Term Goal 4 - Progress (Week 1): Progressing toward goal ?Week 2:  PT Short Term Goal 1 (Week 2): Pt will ambulate 1229fwith CGA and LRAD ?PT Short Term Goal 2 (Week 2): pt will ascend  8 steps with min assist consistently ?PT Short Term Goal 3 (Week 2): [facility] ?PT Short Term Goal 4 (Week 2): Pt will propell WC 1004fith supervision assist ? ? ?Skilled Therapeutic Interventions/Progress Updates:  ?Patient supine in bed on entrance to room. Patient alert and agreeable to PT session.  ? ?Patient with no pain complaint throughout session. ? ?Therapeutic Activity: ?Bed Mobility: Patient performed supine --> sit with light MinA and extensive cueing for technique to push with BUE to reach upright seated position on EOB. Slow to produce forward scoot but is able to complete with extra time and cueing. Dressing and donning shoes performed with MaxA for time.  ?Transfers: Patient performed initial sit<>stand from elevated bed to simulate home 4 poster bed and is able to reach balance standing at RW with CGA. Sit<>stand and stand pivot transfers throughout session from w/c height with minA and improved forward lean but continues to demo slight posterior bias and  toes noted to raise from floor. Provided verbal cues for improved technique and anterior weight shift over toes. Toilet transfer with CGA to reach toilet and ModElmar power up from toilet.  ? ?Gait Training:  ?Patient ambulated 115' x1 and short distances about room using RW with CGA/ close supervision and w/c follow for fatigue. Demonstrated late clearance of all obstacles in hallway. With fatigue, pt demos push forward of walker and requires MinA to maintain balance and for walker mgmt. Guided in head turns during amb with gradual path deviation toward L side of hallway while progressing through turns.  ? ?Patient seated upright  in recliner at end of session with brakes locked, belt alarm set, and all needs within reach. ?  ?Session 2: ?Patient seated in cliner on entrance to room. Patient alert and agreeable to PT session.  ? ?Patient with no pain complaint throughout session. ? ?Therapeutic Activity: ?Transfers: Patient performed sit<>stand and stand pivot transfers throughout session with MinA to overcome posterior bias in standing. Provided verbal cues for technique. ? ?Neuromuscular Re-ed: ?NMR facilitated during session with focus on standing balance, vision and cognition. Pt guided in use of BITS for visual scan task of sequencing complex array. Pt has difficulty getting finger to touch at exact spot affecting accuracy but is 100% correct in sequence. Completes alphabet in 5mi58m2 sec with reaction time of 13.52 sec.  ? ?Blocked practice of sit<>stand with pt improving to MinA with minimal  cueing by end of practice. NMR performed for improvements in motor control and coordination, balance, sequencing, judgement, and self confidence/ efficacy in performing all aspects of mobility at highest level of independence.  ? ?Patient seated upright  in recliner at end of session with brakes locked, belt alarm set, and all needs within reach. ? ? ? ?Therapy Documentation ?Precautions:  ?Precautions ?Precautions:  Fall ?Precaution Comments: baseline R HP and cognitive deficits ?Restrictions ?Weight Bearing Restrictions: Yes ?RLE Weight Bearing: Partial weight bearing ?Other Position/Activity Restrictions: heel WB with surgical shoe ?General: ?  ?Vital Signs: ?  ?Pain: ? No pain complaint throughout session.  ? ?Therapy/Group: Individual Therapy ? ?Alger Simons PT, DPT ?05/17/2021, 6:21 PM  ?

## 2021-05-18 LAB — GLUCOSE, CAPILLARY
Glucose-Capillary: 84 mg/dL (ref 70–99)
Glucose-Capillary: 151 mg/dL — ABNORMAL HIGH (ref 70–99)
Glucose-Capillary: 152 mg/dL — ABNORMAL HIGH (ref 70–99)
Glucose-Capillary: 152 mg/dL — ABNORMAL HIGH (ref 70–99)

## 2021-05-18 NOTE — Patient Care Conference (Signed)
Inpatient RehabilitationTeam Conference and Plan of Care Update ?Date: 05/18/2021   Time: 10:07 AM  ? ? ?Patient Name: [name]      ?Medical Record Number: [id number]  ?Date of Birth: 04/15/1960 ?Sex: Male         ?Room/Bed: 4M10C/4M10C-01 ?Payor Info: Payor: MEDICARE / Plan: MEDICARE PART A AND B / Product Type: *No Product type* /   ? ?Admit Date/Time:  05/06/2021 11:44 AM ? ?Primary Diagnosis:  Pulmonary embolism ([place]) ? ?Hospital Problems: Principal Problem: ?  Pulmonary embolism ([place]) ?Active Problems: ?  Essential hypertension ?  Acute pulmonary embolism ([place]) ?  Controlled type 2 diabetes mellitus with hyperglycemia, without long-term current use of insulin ([place]) ? ? ? ?Expected Discharge Date: Expected Discharge Date: 05/25/21 ? ?Team Members Present: ?Physician leading conference: Dr. Alysia Penna ?Social Worker Present: Erlene Quan, BSW ?Nurse Present: Dorien Chihuahua, RN ?PT Present: Barrie Folk, PT ?OT Present: Providence Lanius, OT ?SLP Present: Sherren Kerns, SLP ?PPS Coordinator present : Gunnar Fusi, SLP ? ?   Current Status/Progress Goal Weekly Team Focus  ?Bowel/Bladder ? ? incont of B/B LBM 4/24  regain cont.  assess q shift and prn   ?Swallow/Nutrition/ Hydration ? ?           ?ADL's ? ? bathing at sink with overall MINA. Set- up to don [place] shirt, MOD A to don pants, min A ambulatory toilet transfer, mod A toilteing, posterior bias and ridgity  supervision bathing and toileting; min A LB dressing  ADL training, functional mobility, strengthening, pt/fam educ   ?Mobility ? ? Bed mobitliy = minA, Transfers = MinA, Gait = CGA using RW for ~!00+ feet  SUP for bed mobility, SUP/ CGA for transfers, SUP for ambulation, MinA for stairs (full flight at home)  NMR for balance, progressing all functional mobility, safety in transfers and gait, family education   ?Communication ? ?           ?Safety/Cognition/ Behavioral Observations ? Fluctuates with fatigue. Mod A for basic medication  management task and problem solving, sup A verbal cues for safety precautions, sup A for memory notebook when other people write in it  sup-to-min A problem solving, sup A memory using external aids, sup A for intellectual awareness  problem solving, awareness, memory   ?Pain ? ? no complaints of pain  remain pain free  assess q shift and prn   ?Skin ? ? surgical incisions. otherwise intact  no new breakdown  assess q shift and prn   ? ? ?Discharge Planning:  ?Discharging home with brother able to provide supervision   ?Team Discussion: ?Patient needs new shoe for weight bearing and off loading of heel. Continue to note parkinsonian presentation; rigid trunk, posterior bias, and decreased intellectual awareness. Patient continues to present with urinary retention and requiring I+O caths ocassionally; on toileting protocol. ? ?Patient on target to meet rehab goals: ?Currently needs min assist for ADL at the sink and set up for upper body and lower body care. Needs min assist to power up and CGA with RW. Needs mod assist for toileting. Needs supervision - CGA - min assist for PT. Completes sit - stand with supervision for anterior weight shifting. Needs min assist for steps and extended amount of time to complete. Able to ambulate up to 100' with supervision but fatigues. Conitnue to work on problem solving, memory. ? ?*See Care Plan and progress notes for long and short-term goals.  ? ?Revisions to Treatment Plan:  ?Memory notebook ?  Practice steps ?Off loading shoe ?  ?Teaching Needs: ?Safety, transfers, toileting, skin care/incision care, medication management, etc ?  ?Current Barriers to Discharge: ?Decreased caregiver support, Home enviroment access/layout, Incontinence, and Wound care ? ?Possible Resolutions to Barriers: ?Family education ?24/7 supervision recommended ?HH follow up services ?DME: w/c ? ?  ? ? Medical Summary ?Current Status: Severe diabetic neuropathy , difficulty with restricting WB RLE ?  Barriers to Discharge: Medical stability;Home enviroment access/layout ?  ?Possible Resolutions to [employer] Focus: cont urecholine and flomax, trial of new Darco sandal ? ? ?Continued Need for Acute Rehabilitation Level of Care: The patient requires daily medical management by a physician with specialized training in physical medicine and rehabilitation for the following reasons: ?Direction of a multidisciplinary physical rehabilitation program to maximize functional independence : Yes ?Medical management of patient stability for increased activity during participation in an intensive rehabilitation regime.: Yes ?Analysis of laboratory values and/or radiology reports with any subsequent need for medication adjustment and/or medical intervention. : Yes ? ? ?I attest that I was present, lead the team conference, and concur with the assessment and plan of the team. ? ? ?Dorien Chihuahua B ?05/18/2021, 11:46 AM  ? ? ? ? ? ? ?

## 2021-05-18 NOTE — Progress Notes (Signed)
Physical Therapy Session Note ? ?Patient Details  ?Name: [name] ?MRN: [id number] ?Date of Birth: 01-Sep-1960 ? ?Today's Date: 05/18/2021 ?PT Individual Time: 3875-6433 ?PT Individual Time Calculation (min): 72 min  ? ?Short Term Goals: ? ?Week 2:  PT Short Term Goal 1 (Week 2): Pt will ambulate 157f with CGA and LRAD ?PT Short Term Goal 2 (Week 2): pt will ascend  8 steps with min assist consistently ?PT Short Term Goal 3 (Week 2): [facility] ?PT Short Term Goal 4 (Week 2): Pt will propell WC 1051fwith supervision assist ? ? ?Skilled Therapeutic Interventions/Progress Updates:  ? ?Pt received supine in bed and agreeable to PT. Supine>sit transfer with supervision assist and min cues for technique. PT assisted pt to don shoes sitting EOB. Stand pivot transfer to [facility] C[facility] due to posterior LOB from new offloading shoe on the RLE.  ? ?WC mobility x 15080fith min assist from PT to prevent veer to the R with cues for increased ROM and force on the RUE.  ? ?Sit<>stand from [facility] C[facility] x 6 throughout session with cues for BLE position and anteiorr weigh shift to prevent posterior LOB as well as cues for decreased "anchoring" of BLE on chair.  ? ?Gait training with RW x 150f37fth min assist due to intermittent LOB to the R and posterior due to new off loading shoe. Mild R knee instabilty but no overt LOB.  ? ?BLE strengthening in parallel bars to perform foot tap on 5inch step forward and laterally 2 x 5 BLE for each. Supervision asist with cues for terminal knee extension on RLE in stance.   ? ?UBE 3 min forward/3 min reverse with cues for full ROM and increased speed of movement throughout. Pt returned to room and performed stand pivot transfer to bed with RW. Sit>supine completed with supervision assist for safety, and left supine in bed with call bell in reach and all needs met.  ? ?   ? ?Therapy Documentation ?Precautions:  ?Precautions ?Precautions: Fall ?Precaution  Comments: baseline R HP and cognitive deficits ?Restrictions ?Weight Bearing Restrictions: Yes ?RLE Weight Bearing: Partial weight bearing ?Other Position/Activity Restrictions: heel WB with surgical shoe ? ?Pain: ?Pain Assessment ?Pain Score: 0-No pain ? ? ?Therapy/Group: Individual Therapy ? ?AustLorie Phenix26/2023, 5:44 PM  ?

## 2021-05-18 NOTE — Progress Notes (Signed)
Patient ID: [name], male   DOB: March 31, 1960, 61 y.o.   MRN: [id number] ? ?Team Conference Report to Patient/Family ? ?Team Conference discussion was reviewed with the patient and caregiver, including goals, any changes in plan of care and target discharge date.  Patient and caregiver express understanding and are in agreement.  The patient has a target discharge date of 05/25/21. ? ?SW spoke with patient and provided team conference updates. Sw informed patient of anticipated discharge date and established [place]. Sw made attempt to call patient brother to schedule family and left detailed voicemail for a return call. No additional questions or concerns.   ? ?Dyanne Iha ?05/18/2021, 2:09 PM  ?

## 2021-05-18 NOTE — Progress Notes (Signed)
Orthopedic Tech Progress Note ?Patient Details:  ?[name] ?[birth year] ?[id number] ? ?Reached out to MD about [employer] not having an "SANDALS"..they do have [employer] DARCO's with offloading insoles" waiting for a response  ? ?Patient ID: [name], male   DOB: [birth year], 61 y.o.   MRN: [id number] ? ?Janit Pagan ?05/18/2021, 8:59 AM ? ?

## 2021-05-18 NOTE — Progress Notes (Signed)
Orthopedic Tech Progress Note ?Patient Details:  ?[name] ?[birth year] ?[id number] ? ?Dropped off IN HOUSE DARCO HEEL WEDGE SHOE to room, let RN  know as well. ? ? Ortho Devices ?Type of Ortho Device: Darco shoe ?Ortho Device/Splint Location: RLE ?Ortho Device/Splint Interventions: Ordered ?  ?Post Interventions ?Patient Tolerated: Well ?Instructions Provided: Care of device ? ?Janit Pagan ?05/18/2021, 11:02 AM ? ?

## 2021-05-18 NOTE — Progress Notes (Signed)
[facility] PHYSICAL MEDICINE & REHABILITATION PROGRESS NOTE ? ?Subjective/Complaints: ? ?Seen in gym ?Pt not sleeping well, no pain c/o , denies bowel or bladder issues  ? ?ROS: Denies CP, SOB, N/V/D ? ? ?Objective: ?Vital Signs: ?Blood pressure 125/78, pulse 73, temperature 98.5 ?F (36.9 ?C), resp. rate 18, height _0  (1.702 m), weight 71.2 kg, SpO2 100 %. ?No results found. ?No results for input(s): WBC, HGB, HCT, PLT in the last 72 hours. ? ?Recent Labs  ?  05/16/21 ?0613  ?NA 133*  ?K 4.1  ?CL 100  ?CO2 26  ?GLUCOSE 111*  ?BUN 22*  ?CREATININE 0.94  ?CALCIUM 9.0  ? ? ? ?Intake/Output Summary (Last 24 hours) at 05/18/2021 0831 ?Last data filed at 05/17/2021 1200 ?Gross per 24 hour  ?Intake --  ?Output 375 ml  ?Net -375 ml  ? ?  ? ?  ? ?Physical Exam: ?BP 125/78 (BP Location: Left Arm)   Pulse 73   Temp 98.5 ?F (36.9 ?C)   Resp 18   Ht _1  (1.702 m)   Wt 71.2 kg   SpO2 100%   BMI 24.58 kg/m?  ? ?General: No acute distress ?Mood and affect are appropriate ?Heart: Regular rate and rhythm no rubs murmurs or extra sounds ?Lungs: Clear to auscultation, breathing unlabored, no rales or wheezes ?Abdomen: Positive bowel sounds, soft nontender to palpation, nondistended ? ? ?Skin: Warm and dry.  Intact.RIgh t1st ray amp site CDI no erythema ?4/24 ? ? ?Psych: Normal behavior.  Normal affect. ?Musc:    Right amp incision with dressing   ?Neuro: 4-/5 throughout,  ? ?Hand intrinsic atrophy with reduced sensation in 5th fingers bilateral , also reduced sensation feet to LT , poor concentration /distractibility limits testing  ? ?Assessment/Plan: ?1. Functional deficits which require 3+ hours per day of interdisciplinary therapy in a comprehensive inpatient rehab setting. ?Physiatrist is providing close team supervision and 24 hour management of active medical problems listed below. ?Physiatrist and rehab team continue to assess barriers to discharge/monitor patient progress toward functional and medical  goals ? ? ?Care Tool: ? ?Bathing ?   ?Body parts bathed by patient: Right arm, Left arm, Chest, Abdomen, Front perineal area, Right upper leg, Left upper leg, Face  ? Body parts bathed by helper: Right lower leg, Left lower leg, Buttocks ?  ?  ?Bathing assist Assist Level: Moderate Assistance - Patient 50 - 74% (standing for washing buttocks) ?  ?  ?Upper Body Dressing/Undressing ?Upper body dressing   ?What is the patient wearing?: Pull over shirt ?   ?Upper body assist Assist Level: Minimal Assistance - Patient > 75% ?   ?Lower Body Dressing/Undressing ?Lower body dressing ? ? ?   ?What is the patient wearing?: Underwear/pull up, Pants ? ?  ? ?Lower body assist Assist for lower body dressing: Maximal Assistance - Patient 25 - 49% ?   ? ?Toileting ?Toileting Toileting Activity did not occur (Probation officer and hygiene only): N/A (no void or bm)  ?Toileting assist Assist for toileting: Maximal Assistance - Patient 25 - 49% ?  ?  ?Transfers ?Chair/bed transfer ? ?Transfers assist ?   ? ?Chair/bed transfer assist level: Contact Guard/Touching assist ?  ?  ?Locomotion ?Ambulation ? ? ?Ambulation assist ? ?   ? ?Assist level: Minimal Assistance - Patient > 75% ?Assistive device: Walker-rolling ?Max distance: 120 ft  ? ?Walk 10 feet activity ? ? ?Assist ?   ? ?Assist level: Minimal Assistance - Patient > 75% ?Assistive  device: Walker-rolling  ? ?Walk 50 feet activity ? ? ?Assist Walk 50 feet with 2 turns activity did not occur: Safety/medical concerns ? ?Assist level: Minimal Assistance - Patient > 75% ?Assistive device: Walker-rolling  ? ? ?Walk 150 feet activity ? ? ?Assist Walk 150 feet activity did not occur: Safety/medical concerns ? ?  ?  ?  ? ?Walk 10 feet on uneven surface  ?activity ? ? ?Assist Walk 10 feet on uneven surfaces activity did not occur: Safety/medical concerns ? ? ?  ?   ? ?Wheelchair ? ? ? ? ?Assist Is the patient using a wheelchair?: Yes ?Type of Wheelchair: Manual ?  ? ?Wheelchair assist  level: Minimal Assistance - Patient > 75% ?Max wheelchair distance: 150  ? ? ?Wheelchair 50 feet with 2 turns activity ? ? ? ?Assist ? ?  ?  ? ? ?Assist Level: Minimal Assistance - Patient > 75%  ? ?Wheelchair 150 feet activity  ? ? ? ?Assist ?   ? ? ?Assist Level: Minimal Assistance - Patient > 75%  ? ? ?Medical Problem List and Plan: ?1. Functional deficits secondary to peripheral arterial disease, RIght 1st ray, 2,3,4 toe amps due to osteomyelitis with pop art stenting Right side surgery and acute pulmonary embolus. ?Continue CIR PT, OT - ELOS 5/3 ?Team conference today please see physician documentation under team conference tab, met with team  to discuss problems,progress, and goals. Formulized individual treatment plan based on medical history, underlying problem and comorbidities.  ?2.  Antithrombotics: ?-DVT/anticoagulation:  Pharmaceutical: had PE now on Eliquis- no sign of surgical site bleeding  ?            -antiplatelet therapy: Aspirin 81 mg daily ?3. Pain Management: Tylenol, tramadol as needed ? Monitor with increased exertion ?4. Onchomycosis of left toes: antifungal cream ordered BID.  ?5. Neuropsych: This patient is capable of making decisions on his own behalf. ?6. Right foot incision: ?            --Daily dry dressing change ?7. Fluids/Electrolytes/Nutrition: Routine I's and O's ?            -- Carb modified diet ?8: PAD: Status post Right popliteal stent placement ?            -- Continue aspirin while on Eliquis.  Stop Plavix. ?            -- Follow-up with Dr. Lucky Cowboy with ABIs ?            -- Status post right partial first ray amputation  ?            -- Right heel weightbearing with postop shoe ? ?9: prior CVA: ?10: Dyslipidemia: Continue Lipitor 40 mg daily ?11: Acute PE: Continue Eliquis ?12: Hypertension: Continue Norvasc 10 mg daily ?Vitals:  ? 05/17/21 1941 05/18/21 0312  ?BP: 133/77 125/78  ?Pulse: 78 73  ?Resp: 14 18  ?Temp: 98.2 ?F (36.8 ?C) 98.5 ?F (36.9 ?C)  ?SpO2: 100% 100%  ?  Controlled 4/26 ? ?13: DM type II with peripheral neuropathy  Restarted metformin 289m daily.  ? CBG (last 3) will order , pt states he has no CBG monitor at home  ?Recent Labs  ?  05/17/21 ?1751 05/17/21 ?2033 05/18/21 ?0543  ?GLUCAP 141* 208* 84  ? ?Controlled 4/26 ? ?14: Seizure disorder maintained on Keppra: Continue 500 mg daily, ? If PCP managed this in past  ?15: Constipation: give sorbitol times once dose now ?            --  schedule senna BID, LBM 4/24, give sorbitol ?16: Urinary retention: d/c foley ?            -- Continue Flomax ? Improved ?17: Chronic diastolic congestive heart failure, euvolemic, no diuretics ?            -- Grade 1 diastolic dysfunction on recent echo ?18.  Hyponatremia ? Sodium 130 on 4/12, labs ordered for tomorrow ?19.  Bradykinesia , masked facies, min cogwheeling, no hx PD , no showed for appt with  Dr Jaynee Eagles- would rec f/u post hospitalization   ?Hx of remote RIght subcortical infarcts which may cause a parkinson like syndrome  , seizure d/o ?LOS: 12 days ?A FACE TO FACE EVALUATION WAS PERFORMED ? ?Luanna Salk Chinita Schimpf ?05/18/2021, 8:31 AM ? ?

## 2021-05-18 NOTE — Progress Notes (Deleted)
Orthopedic Tech Progress Note ?Patient Details:  ?Gianmarco Roye ?[birth year] ?[id number] ? ?Called in order to [employer] ? ?Patient ID: Sotero Brinkmeyer, male   DOB: January 26, 1960, 61 y.o.   MRN: [id number] ? ?Janit Pagan ?05/18/2021, 10:22 AM ? ?

## 2021-05-18 NOTE — Progress Notes (Signed)
Speech Language Pathology Daily Session Note ? ?Patient Details  ?Name: [name] ?MRN: [id number] ?Date of Birth: 09/09/1960 ? ?Today's Date: 05/18/2021 ?SLP Individual Time: 1003-1030 ?SLP Individual Time Calculation (min): 27 min ? ?Short Term Goals: ?Week 1: SLP Short Term Goal 1 (Week 1): Patient will utilize external memory aids to recall weight bearing precautions with min A verbal cues ?SLP Short Term Goal 2 (Week 1): Patient will verbalize 2 safety precautions associated with new limitations at min A level ?SLP Short Term Goal 3 (Week 1): Patient will demonstrate functional problem solving for basic-to-mildly complex tasks with min A verbal cues. ? ?Skilled Therapeutic Interventions:Skilled ST services focused on cognitive skills. SLP facilitated basic problem solving in 4 step ADL picture sequencing task, pt required min A verbal cues. Pt was left in room with call bell within reach and chair alarm set. SLP recommends to continue skilled services. ?   ? ?Pain ?Pain Assessment ?Pain Score: 0-No pain ? ?Therapy/Group: Individual Therapy ? ?[name] ?05/18/2021, 2:17 PM ?

## 2021-05-18 NOTE — Progress Notes (Signed)
Occupational Therapy Session Note ? ?Patient Details  ?Name: [name] ?MRN: [id number] ?Date of Birth: 22-Sep-1960 ? ?Today's Date: 05/18/2021 ?OT Individual Time: 3462-1947 ?OT Individual Time Calculation (min): 38 min  ? ? ?Short Term Goals: ?Week 1:  OT Short Term Goal 1 (Week 1): pt will complete >2 standing grooming task with CGA ?OT Short Term Goal 1 - Progress (Week 1): Met ?OT Short Term Goal 2 (Week 1): Pt will complete ambulatory toilet transfer with min A and LRAD ?OT Short Term Goal 2 - Progress (Week 1): Not met ?OT Short Term Goal 3 (Week 1): Pt will don pants with mod A ?OT Short Term Goal 3 - Progress (Week 1): Not met ?OT Short Term Goal 4 (Week 1): Pt will assist with donning surgical shoe wwith no more than mod A. ?OT Short Term Goal 4 - Progress (Week 1): Not met ?OT Short Term Goal 5 (Week 1): Pt will complete ambulatory toilet transfer with min A and LRAD ?Week 2:  OT Short Term Goal 1 (Week 2): Pt will assist with donning surgical shoe wwith no more than mod A. ?OT Short Term Goal 2 (Week 2): Pt will don pants with mod A ?OT Short Term Goal 3 (Week 2): Pt will don shirt with S. ? ?Skilled Therapeutic Interventions/Progress Updates:  ?  Pt received asleep in recliner, easily awakened to voice, agreeable to therapy. Session focus on self-care retraining, activity tolerance, transfer retraining in prep for improved ADL/IADL/func mobility performance + decreased caregiver burden. ? ?Donned new off loading heel R boot. Ambulated to and from shower stool with overall min A for balance and to power up with RW. Bathed UB with S, LB with mod A to reach lower BLE and buttocks in standing, would bennefit from [place]. Min A to don shirt to pull donw in back and thread RUE. Total A to don underwear/pants and shoes primarily due to time constraints, but had pt trial with reacher. Will benefit from further practice as ultimately required max A to incorporate functionally. ? ?Pt left seated in recliner  with safety belt alarm engaged, call bell in reach, and all immediate needs met.  ? ? ?Therapy Documentation ?Precautions:  ?Precautions ?Precautions: Fall ?Precaution Comments: baseline R HP and cognitive deficits ?Restrictions ?Weight Bearing Restrictions: Yes ?RLE Weight Bearing: Partial weight bearing ?Other Position/Activity Restrictions: heel WB with surgical shoe ? ?Pain: ?  No c/o ?ADL: See Care Tool for more details. ? ? ?Therapy/Group: Individual Therapy ? ?Volanda Napoleon MS, OTR/L ? ?05/18/2021, 6:52 AM ?

## 2021-05-18 NOTE — Progress Notes (Signed)
Physical Therapy Session Note ? ?Patient Details  ?Name: [name] ?MRN: [id number] ?Date of Birth: [birth year] ? ?Today's Date: 05/18/2021 ?PT Individual Time: [phone] ?PT Individual Time Calculation (min): 68 min  ? ?Short Term Goals: ?Week 1:  PT Short Term Goal 1 (Week 1): pt will perform bed mobility with CGA ?PT Short Term Goal 1 - Progress (Week 1): Met ?PT Short Term Goal 2 (Week 1): Pt will trasnfer to [facility] with min assist consistently ?PT Short Term Goal 2 - Progress (Week 1): Met ?PT Short Term Goal 3 (Week 1): Pt will ambulate 172f with min assist and LRAD ?PT Short Term Goal 3 - Progress (Week 1): Met ?PT Short Term Goal 4 (Week 1): Pt will ascend 8 steps with min assist ?PT Short Term Goal 4 - Progress (Week 1): Progressing toward goal ?Week 2:  PT Short Term Goal 1 (Week 2): Pt will ambulate 1261fwith CGA and LRAD ?PT Short Term Goal 2 (Week 2): pt will ascend  8 steps with min assist consistently ?PT Short Term Goal 3 (Week 2): [facility] ?PT Short Term Goal 4 (Week 2): Pt will propell WC 10036fith supervision assist ? ?Skilled Therapeutic Interventions/Progress Updates:  ? ?Pt received supine in bed and agreeable to PT. Supine>sit transfer with supervision assist and moderated cues for sequencing of log roll to R side of bed and then use of RUE to push in to sitting.  ? ?Dressing EOB for upper and lower body. Mod assist for lower body with assist to manage pants over feet and pull pants to waist in standing. Sit<>stand x 3 to don pants with mod assist initially progressing to supervision assist from elevated bed donning shirt with set up assist only and increased time.  ? ?Stand pivot transfers with CGA-min assist with max cues for head hips relationship to prevent posterior bias.  ? ?Nustep reciprocal and rotational movement training x 4.5 min +3.5 min. Min-mod assist from PT throughout with moderate cues for full ROM, increased ROM and amplitude of  movement and trunkal rotation ? ?Stair management training x 4 ascent and descent with min assist overall and cues for step to gait pattenr with BUE support on rails to simulate home access. Pt then performed step up/down 1 step x 5 with BUE supported on Rside rail with emphasis on LLE strengthening.  ? ?Gait training with RW x [id number] supervision assist-CGA for safety with cues for WB through heel on the R LE ? ?Patient returned to room and performed stand pivot to recliner with CGA and RW. Pt left sitting in recliner with call bell in reach and all needs met.   ? ?   ? ?Therapy Documentation ?Precautions:  ?Precautions ?Precautions: Fall ?Precaution Comments: baseline R HP and cognitive deficits ?Restrictions ?Weight Bearing Restrictions: Yes ?RLE Weight Bearing: Partial weight bearing ?Other Position/Activity Restrictions: heel WB with surgical shoe ? ?  ?Pain: ?denies ? ? ?Therapy/Group: Individual Therapy ? ?AustLorie Phenix26/2023, 9:21 AM  ?

## 2021-05-19 LAB — GLUCOSE, CAPILLARY
Glucose-Capillary: 94 mg/dL (ref 70–99)
Glucose-Capillary: 141 mg/dL — ABNORMAL HIGH (ref 70–99)
Glucose-Capillary: 165 mg/dL — ABNORMAL HIGH (ref 70–99)
Glucose-Capillary: 187 mg/dL — ABNORMAL HIGH (ref 70–99)

## 2021-05-19 NOTE — Progress Notes (Signed)
Physical Therapy Session Note ? ?Patient Details  ?Name: [name] ?MRN: [id number] ?Date of Birth: 1960/05/30 ? ?Today's Date: 05/19/2021 ?PT Individual Time: [id number] ?PT Individual Time Calculation (min): 76 min  ? ?Short Term Goals: ?Week 1:  PT Short Term Goal 1 (Week 1): pt will perform bed mobility with CGA ?PT Short Term Goal 1 - Progress (Week 1): Met ?PT Short Term Goal 2 (Week 1): Pt will trasnfer to [facility] with min assist consistently ?PT Short Term Goal 2 - Progress (Week 1): Met ?PT Short Term Goal 3 (Week 1): Pt will ambulate 137f with min assist and LRAD ?PT Short Term Goal 3 - Progress (Week 1): Met ?PT Short Term Goal 4 (Week 1): Pt will ascend 8 steps with min assist ?PT Short Term Goal 4 - Progress (Week 1): Progressing toward goal ?Week 2:  PT Short Term Goal 1 (Week 2): Pt will ambulate 1234fwith CGA and LRAD ?PT Short Term Goal 2 (Week 2): pt will ascend  8 steps with min assist consistently ?PT Short Term Goal 3 (Week 2): [facility] ?PT Short Term Goal 4 (Week 2): Pt will propell WC 10057fith supervision assist ? ?Skilled Therapeutic Interventions/Progress Updates:  ? ?Pt received sitting in recliner and agreeable to PT.  Scooting to Edge of seat with min assist due to poor pelvic rotation. Pt performed stand pivot transfer with min-mod and moderate cues for motor planning from PT for safety.  ? ?Gait training in rehab gym x [id number] min assist with cues for proper WB through heel in offloading shoe as well as cues for improve posture and decreased speed to prevent anterior LOB. Additional gait training with surgical shoe with CGA for safety with cues for AD management in turns through door and around bed. Noted to have improved safety with surgical shoe compared to offloading shoe. ? ?Stair management training with BUE support on 1 rail to simulate home environment 2 x 4 with prolonged therapeutic rest break between bouts.  ? ?PT instructed pt in  seated BLE therex:  ?LAQ, 2 x 10 with level 3 tband  ?Hip abduction, 2 x 10  with level 3 tband  ?Hip extension from seated position 2 x 10 with level 3 tband  ?Ankle DF. 2 x 20 AROM ? Cues for full ROM and hold at end range.  ? ?Patient returned to room and performed stand pivot to recliner with RW and CGA for safety. Pt left sitting in recliner with call bell in reach and all needs met.   ? ? ?   ? ?Therapy Documentation ?Precautions:  ?Precautions ?Precautions: Fall ?Precaution Comments: baseline R HP and cognitive deficits ?Restrictions ?Weight Bearing Restrictions: Yes ?RLE Weight Bearing: Partial weight bearing ?Other Position/Activity Restrictions: heel WB with surgical shoe ? ?Pain: ?denies ? ? ? ?Therapy/Group: Individual Therapy ? ?AustLorie Phenix27/2023, 9:20 AM  ?

## 2021-05-19 NOTE — Progress Notes (Signed)
Occupational Therapy Session Note ? ?Patient Details  ?Name: [name] ?MRN: [id number] ?Date of Birth: 1960-04-05 ? ?Today's Date: 05/19/2021 ?OT Individual Time: 7062-3762 ?OT Individual Time Calculation (min): 53 min  ? ? ?Short Term Goals: ?Week 1:  OT Short Term Goal 1 (Week 1): pt will complete >2 standing grooming task with CGA ?OT Short Term Goal 1 - Progress (Week 1): Met ?OT Short Term Goal 2 (Week 1): Pt will complete ambulatory toilet transfer with min A and LRAD ?OT Short Term Goal 2 - Progress (Week 1): Not met ?OT Short Term Goal 3 (Week 1): Pt will don pants with mod A ?OT Short Term Goal 3 - Progress (Week 1): Not met ?OT Short Term Goal 4 (Week 1): Pt will assist with donning surgical shoe wwith no more than mod A. ?OT Short Term Goal 4 - Progress (Week 1): Not met ?OT Short Term Goal 5 (Week 1): Pt will complete ambulatory toilet transfer with min A and LRAD ?Week 2:  OT Short Term Goal 1 (Week 2): Pt will assist with donning surgical shoe wwith no more than mod A. ?OT Short Term Goal 2 (Week 2): Pt will don pants with mod A ?OT Short Term Goal 3 (Week 2): Pt will don shirt with S. ? ?Skilled Therapeutic Interventions/Progress Updates:  ?  Pt received semi-reclined in bed finished with breakfast, denies pain, agreeable to therapy. Session focus on self-care retraining, activity tolerance, transfer retraining, AE education in prep for improved ADL/IADL/func mobility performance + decreased caregiver burden. ? ?Came to sittng EOB on his R with use of bed rail and increased time/overall S. Donned pants with use of reacher this date, only min A to power up into standing and to pull up over R hip. Able to thread BLE with use of cues and reacher. Donned shirt with S and increased time, cues to pull down in front/back.  ? ?Short ambulatory transfers throughout session with heavy min A to power up due to posterior bias and difficulty coming into fully upright posture with RW. Total A to don R darco and  L shoe + dep w/c transport to and from gym. ? ? ?Stood x2 to play Single Target game on bits to focus on sit to stands and coming into fully upright posture. Again required heavy min A to power up and up to CGA/min A for balance with unilateral UE support on RW, cues for safe hand placement. ? -93.94% accuracy, 3.78 sec reaction time, 31 hits ? - 79.17% accuracy,  3.13 sec reaction time, 38 hits ? ?Pt did require total A to recall (getting dressed / "dot" game" for therapist to write in mem notebook. ? ?Pt left seated in recliner with safety belt alarm engaged, call bell in reach, and all immediate needs met.  ? ? ?Therapy Documentation ?Precautions:  ?Precautions ?Precautions: Fall ?Precaution Comments: baseline R HP and cognitive deficits ?Restrictions ?Weight Bearing Restrictions: Yes ?RLE Weight Bearing: Partial weight bearing ?Other Position/Activity Restrictions: heel WB with surgical shoe ?Pain: denies ?  ?ADL: See Care Tool for more details. ? ? ?Therapy/Group: Individual Therapy ? ?Volanda Napoleon MS, OTR/L ? ?05/19/2021, 6:50 AM ?

## 2021-05-19 NOTE — Progress Notes (Signed)
[place] PHYSICAL MEDICINE & REHABILITATION PROGRESS NOTE ? ?Subjective/Complaints: ? ?No issues , discussed shoewear with PT ?Has darco sandal with built up heel to avoid WB through forefoot  ? ?ROS: Denies CP, SOB, N/V/D ? ? ?Objective: ?Vital Signs: ?Blood pressure 128/77, pulse 70, temperature 98.7 ?F (37.1 ?C), temperature source Oral, resp. rate 14, height 5\' 7"  (1.702 m), weight 71.2 kg, SpO2 100 %. ?No results found. ?No results for input(s): WBC, HGB, HCT, PLT in the last 72 hours. ? ?No results for input(s): NA, K, CL, CO2, GLUCOSE, BUN, CREATININE, CALCIUM in the last 72 hours. ? ? ?Intake/Output Summary (Last 24 hours) at 05/19/2021 0808 ?Last data filed at 05/19/2021 0446 ?Gross per 24 hour  ?Intake 475 ml  ?Output 1150 ml  ?Net -675 ml  ? ?  ? ?  ? ?Physical Exam: ?BP 128/77 (BP Location: Left Arm)   Pulse 70   Temp 98.7 ?F (37.1 ?C) (Oral)   Resp 14   Ht 5\' 7"  (1.702 m)   Wt 71.2 kg   SpO2 100%   BMI 24.58 kg/m?  ? ?General: No acute distress ?Mood and affect are appropriate ?Heart: Regular rate and rhythm no rubs murmurs or extra sounds ?Lungs: Clear to auscultation, breathing unlabored, no rales or wheezes ?Abdomen: Positive bowel sounds, soft nontender to palpation, nondistended ? ? ?Skin: Warm and dry.  Intact.RIgh t1st ray amp site CDI no erythema ?4/24 ? ? ?Psych: Normal behavior.  Normal affect. ?Musc:    Right amp incision with dressing   ?Neuro: 4-/5 throughout,  ? ?Hand intrinsic atrophy with reduced sensation in 5th fingers bilateral , also reduced sensation feet to LT , poor concentration /distractibility limits testing  ? ?Assessment/Plan: ?1. Functional deficits which require 3+ hours per day of interdisciplinary therapy in a comprehensive inpatient rehab setting. ?Physiatrist is providing close team supervision and 24 hour management of active medical problems listed below. ?Physiatrist and rehab team continue to assess barriers to discharge/monitor patient progress toward  functional and medical goals ? ? ?Care Tool: ? ?Bathing ?   ?Body parts bathed by patient: Right arm, Left arm, Chest, Abdomen, Front perineal area, Right upper leg, Left upper leg, Face  ? Body parts bathed by helper: Right lower leg, Left lower leg, Buttocks ?  ?  ?Bathing assist Assist Level: Moderate Assistance - Patient 50 - 74% (standing for washing buttocks) ?  ?  ?Upper Body Dressing/Undressing ?Upper body dressing   ?What is the patient wearing?: Pull over shirt ?   ?Upper body assist Assist Level: Supervision/Verbal cueing ?   ?Lower Body Dressing/Undressing ?Lower body dressing ? ? ?   ?What is the patient wearing?: Pants ? ?  ? ?Lower body assist Assist for lower body dressing: Minimal Assistance - Patient > 75% (reacher) ?   ? ?Toileting ?Toileting Toileting Activity did not occur (Probation officer and hygiene only): N/A (no void or bm)  ?Toileting assist Assist for toileting: Maximal Assistance - Patient 25 - 49% ?  ?  ?Transfers ?Chair/bed transfer ? ?Transfers assist ?   ? ?Chair/bed transfer assist level: Contact Guard/Touching assist ?  ?  ?Locomotion ?Ambulation ? ? ?Ambulation assist ? ?   ? ?Assist level: Minimal Assistance - Patient > 75% ?Assistive device: Walker-rolling ?Max distance: 120 ft  ? ?Walk 10 feet activity ? ? ?Assist ?   ? ?Assist level: Minimal Assistance - Patient > 75% ?Assistive device: Walker-rolling  ? ?Walk 50 feet activity ? ? ?Assist Walk 50 feet  with 2 turns activity did not occur: Safety/medical concerns ? ?Assist level: Minimal Assistance - Patient > 75% ?Assistive device: Walker-rolling  ? ? ?Walk 150 feet activity ? ? ?Assist Walk 150 feet activity did not occur: Safety/medical concerns ? ?  ?  ?  ? ?Walk 10 feet on uneven surface  ?activity ? ? ?Assist Walk 10 feet on uneven surfaces activity did not occur: Safety/medical concerns ? ? ?  ?   ? ?Wheelchair ? ? ? ? ?Assist Is the patient using a wheelchair?: Yes ?Type of Wheelchair: Manual ?  ? ?Wheelchair assist  level: Minimal Assistance - Patient > 75% ?Max wheelchair distance: 150  ? ? ?Wheelchair 50 feet with 2 turns activity ? ? ? ?Assist ? ?  ?  ? ? ?Assist Level: Minimal Assistance - Patient > 75%  ? ?Wheelchair 150 feet activity  ? ? ? ?Assist ?   ? ? ?Assist Level: Minimal Assistance - Patient > 75%  ? ? ?Medical Problem List and Plan: ?1. Functional deficits secondary to peripheral arterial disease, RIght 1st ray, 2,3,4 toe amps due to osteomyelitis with pop art stenting Right side surgery and acute pulmonary embolus. ?Continue CIR PT, OT - ELOS 5/3 ? ?2.  Antithrombotics: ?-DVT/anticoagulation:  Pharmaceutical: had PE now on Eliquis- no sign of surgical site bleeding  ?            -antiplatelet therapy: Aspirin 81 mg daily ?3. Pain Management: Tylenol, tramadol as needed ? Monitor with increased exertion ?4. Onchomycosis of left toes: antifungal cream ordered BID.  ?5. Neuropsych: This patient is capable of making decisions on his own behalf. ?6. Right foot incision: ?            --Daily dry dressing change ?7. Fluids/Electrolytes/Nutrition: Routine I's and O's ?            -- Carb modified diet ?8: PAD: Status post Right popliteal stent placement ?            -- Continue aspirin while on Eliquis.  Stop Plavix. ?            -- Follow-up with Dr. Lucky Cowboy with ABIs ?            -- Status post right partial first ray amputation  ?            -- Right heel weightbearing with postop shoe- trial of darco sandal PT evaluating use  ? ?9: prior CVA: ?10: Dyslipidemia: Continue Lipitor 40 mg daily ?11: Acute PE: Continue Eliquis ?12: Hypertension: Continue Norvasc 10 mg daily ?Vitals:  ? 05/18/21 1916 05/19/21 0332  ?BP: 135/81 128/77  ?Pulse: 74 70  ?Resp: 14 14  ?Temp: 98.2 ?F (36.8 ?C) 98.7 ?F (37.1 ?C)  ?SpO2: 100% 100%  ? Controlled 4/27 ? ?13: DM type II with peripheral neuropathy  Restarted metformin 250mg  daily.  ? CBG (last 3) will order , pt states he has no CBG monitor at home  ?Recent Labs  ?  05/18/21 ?1645  05/18/21 ?2057 05/19/21 ?0602  ?GLUCAP 152* 152* 94  ? ?Controlled 4/27 ? ?14: Seizure disorder maintained on Keppra: Continue 500 mg daily, ? If PCP managed this in past  ?15: Constipation: give sorbitol times once dose now ?            --schedule senna BID, LBM 4/24, give sorbitol ?16: Urinary retention: d/c foley ?            -- Continue Flomax ? Improved ?17:  Chronic diastolic congestive heart failure, euvolemic, no diuretics ?            -- Grade 1 diastolic dysfunction on recent echo ?18.  Hyponatremia ? Sodium 130 on 4/12, labs ordered for tomorrow ?19.  Bradykinesia , masked facies, min cogwheeling, no hx PD , no showed for appt with  Dr Jaynee Eagles- would rec f/u post hospitalization   ?Hx of remote RIght subcortical infarcts which may cause a parkinson like syndrome  , seizure d/o ?LOS: 13 days ?A FACE TO FACE EVALUATION WAS PERFORMED ? ?[name] ?05/19/2021, 8:08 AM ? ?

## 2021-05-19 NOTE — Progress Notes (Signed)
Speech Language Pathology Weekly Progress and Session Note ? ?Patient Details  ?Name: [name] ?MRN: [id number] ?Date of Birth: Jun 04, 1960 ? ?Beginning of progress report period: May 12, 2021 ?End of progress report period: May 19, 2021 ? ?Today's Date: 05/19/2021 ?SLP Individual Time: 1100-1200 ?SLP Individual Time Calculation (min): 60 min ? ?Short Term Goals: ?Week 1: SLP Short Term Goal 1 (Week 1): Patient will utilize external memory aids to recall weight bearing precautions with min A verbal cues ?SLP Short Term Goal 1 - Progress (Week 1): Met ?SLP Short Term Goal 2 (Week 1): Patient will verbalize 2 safety precautions associated with new limitations at min A level ?SLP Short Term Goal 2 - Progress (Week 1): Met ?SLP Short Term Goal 3 (Week 1): Patient will demonstrate functional problem solving for basic-to-mildly complex tasks with min A verbal cues. ?SLP Short Term Goal 3 - Progress (Week 1): Met ? ?New Short Term Goals: ?Week 2: SLP Short Term Goal 1 (Week 2): STG=LTG due to ELOS ? ?Weekly Progress Updates: Patient has made functional gains and has met 3 of 3 STGs this reporting period due to improved intellectual awareness of deficits, use of external memory aids, and recall of safety precautions associated with new physical limitations. Pt is currently at min A level for basic cognition and likely not far from baseline per discussion with family who confirmed hx of baseline cognitive deficits. Patient and family education is ongoing. Patient would benefit from continued skilled SLP intervention to maximize cognitive functioning and overall functional independence prior to discharge.   ?  ?Intensity: Minumum of 1-2 x/day, 30 to 90 minutes ?Frequency: 3 to 5 out of 7 days ?Duration/Length of Stay: 5/3 ?Treatment/Interventions: Cognitive remediation/compensation;Environmental controls;Internal/external aids;Patient/family education;Functional tasks ? ?Daily Session ?Skilled Therapeutic Interventions:  Skilled ST treatment focused on cognitive goals. SLP facilitated session by providing min A for verbal reasoning and problem solving tasks by generating additional item in a list based on category/attribute. Pt also identified the "odd one out" by selecting item that did not belong with the rest of items based on category/attribute/function. SLP facilitated use of external memory aid via memory notebook to recall prevoius events in therapy with min A verbal cues. Pt stated he doesn't plan to use memory notebook at home despite helpful uses in current setting. Patient engaged in functional discussion regarding intellectual/emergent awareness of deficits by identifying current limitations and how they may impact him at home with min A verbal question cues. Pt initially unable to recall/ID physical deficits. Pt recalled foot precautions (I.e., lead with heal, not toe) with sup A verbal cues. Patient was left in wheelchair with alarm activated and immediate needs within reach at end of session. Continue per current plan of care.     ? ?General  ?  ?Pain ? None ? ?Therapy/Group: Individual Therapy ? ?[name] T [name] ?05/19/2021, 4:14 PM ? ? ? ? ? ? ?

## 2021-05-19 NOTE — Progress Notes (Signed)
Physical Therapy Session Note ? ?Patient Details  ?Name: [name] ?MRN: [id number] ?Date of Birth: Apr 03, 1960 ? ?Today's Date: 05/19/2021 ?PT Individual Time: 6962-9528 ?PT Individual Time Calculation (min): 40 min  ? ?Short Term Goals: ?Week 2:  PT Short Term Goal 1 (Week 2): Pt will ambulate 110ft with CGA and LRAD ?PT Short Term Goal 2 (Week 2): pt will ascend  8 steps with min assist consistently ?PT Short Term Goal 3 (Week 2): Pt will transfer to and from [facility] with CGA consistently ?PT Short Term Goal 4 (Week 2): Pt will propell WC 149ft with supervision assist ? ?Skilled Therapeutic Interventions/Progress Updates:  ?  Patient received sitting up in recliner, agreeable to PT. He denies pain. ModA to stand from recliner to walker, MinA ambulatory transfer to wc. PT transporting patient in wc to therapy gym for time management and energy conservation. Seated trunk rotation boxing at targets on bag. Encouraging cross punching for increased truncal rotation. Patient very hypokinetic. Seated rowing rotating trunk with hockey stick to tap cones on ground. This exercise produced very good trunk rotation, but still very slow movement. Patient able to stand with RW and CGA completing large amplitude steps to targets. Noted good carryover to gait tx task with RW and CGA x59ft. Patient attempting to propel himself in wc using B UE, but difficulty producing enough force to move wc. Patient returning to room in wc, ambulatory transfer to recliner. Seatbelt alarm on, call light within reach.  ? ?Therapy Documentation ?Precautions:  ?Precautions ?Precautions: Fall ?Precaution Comments: baseline R HP and cognitive deficits ?Restrictions ?Weight Bearing Restrictions: Yes ?RLE Weight Bearing: Partial weight bearing ?Other Position/Activity Restrictions: heel WB with surgical shoe ? ? ? ?Therapy/Group: Individual Therapy ? ?Debbora Dus ?Debbora Dus, PT, DPT, CBIS ? ?05/19/2021, 9:11 AM  ?

## 2021-05-20 LAB — GLUCOSE, CAPILLARY
Glucose-Capillary: 97 mg/dL (ref 70–99)
Glucose-Capillary: 128 mg/dL — ABNORMAL HIGH (ref 70–99)
Glucose-Capillary: 197 mg/dL — ABNORMAL HIGH (ref 70–99)
Glucose-Capillary: 219 mg/dL — ABNORMAL HIGH (ref 70–99)

## 2021-05-20 NOTE — Progress Notes (Signed)
[place] PHYSICAL MEDICINE & REHABILITATION PROGRESS NOTE ? ?Subjective/Complaints: ? ?No Pain issues ? ?ROS: Denies CP, SOB, N/V/D ? ? ?Objective: ?Vital Signs: ?Blood pressure (!) 141/79, pulse 67, temperature 98.1 ?F (36.7 ?C), temperature source Oral, resp. rate 17, height 5\' 7"  (1.702 m), weight 71.2 kg, SpO2 100 %. ?No results found. ?No results for input(s): WBC, HGB, HCT, PLT in the last 72 hours. ? ?No results for input(s): NA, K, CL, CO2, GLUCOSE, BUN, CREATININE, CALCIUM in the last 72 hours. ? ? ?Intake/Output Summary (Last 24 hours) at 05/20/2021 0727 ?Last data filed at 05/19/2021 2310 ?Gross per 24 hour  ?Intake 592 ml  ?Output 2 ml  ?Net 590 ml  ? ?  ? ?  ? ?Physical Exam: ?BP (!) 141/79 (BP Location: Left Arm)   Pulse 67   Temp 98.1 ?F (36.7 ?C) (Oral)   Resp 17   Ht 5\' 7"  (1.702 m)   Wt 71.2 kg   SpO2 100%   BMI 24.58 kg/m?  ? ?General: No acute distress ?Mood and affect are appropriate ?Heart: Regular rate and rhythm no rubs murmurs or extra sounds ?Lungs: Clear to auscultation, breathing unlabored, no rales or wheezes ?Abdomen: Positive bowel sounds, soft nontender to palpation, nondistended ? ? ?Skin: Warm and dry.  Intact.RIgh t1st ray amp site CDI no erythema ?4/24 ? ? ? ? ?Psych: Normal behavior.  Normal affect. ?Musc:    Right amp incision with dressing   ?Neuro: 4-/5 throughout,  ? ?Hand intrinsic atrophy with reduced sensation in 5th fingers bilateral , also reduced sensation feet to LT , poor concentration /distractibility limits testing  ? ?Assessment/Plan: ?1. Functional deficits which require 3+ hours per day of interdisciplinary therapy in a comprehensive inpatient rehab setting. ?Physiatrist is providing close team supervision and 24 hour management of active medical problems listed below. ?Physiatrist and rehab team continue to assess barriers to discharge/monitor patient progress toward functional and medical goals ? ? ?Care Tool: ? ?Bathing ?   ?Body parts bathed by  patient: Right arm, Left arm, Chest, Abdomen, Front perineal area, Right upper leg, Left upper leg, Face  ? Body parts bathed by helper: Right lower leg, Left lower leg, Buttocks ?  ?  ?Bathing assist Assist Level: Moderate Assistance - Patient 50 - 74% (standing for washing buttocks) ?  ?  ?Upper Body Dressing/Undressing ?Upper body dressing   ?What is the patient wearing?: Pull over shirt ?   ?Upper body assist Assist Level: Supervision/Verbal cueing ?   ?Lower Body Dressing/Undressing ?Lower body dressing ? ? ?   ?What is the patient wearing?: Pants ? ?  ? ?Lower body assist Assist for lower body dressing: Minimal Assistance - Patient > 75% (reacher) ?   ? ?Toileting ?Toileting Toileting Activity did not occur (Probation officer and hygiene only): N/A (no void or bm)  ?Toileting assist Assist for toileting: Maximal Assistance - Patient 25 - 49% ?  ?  ?Transfers ?Chair/bed transfer ? ?Transfers assist ?   ? ?Chair/bed transfer assist level: Contact Guard/Touching assist ?  ?  ?Locomotion ?Ambulation ? ? ?Ambulation assist ? ?   ? ?Assist level: Minimal Assistance - Patient > 75% ?Assistive device: Walker-rolling ?Max distance: 120 ft  ? ?Walk 10 feet activity ? ? ?Assist ?   ? ?Assist level: Minimal Assistance - Patient > 75% ?Assistive device: Walker-rolling  ? ?Walk 50 feet activity ? ? ?Assist Walk 50 feet with 2 turns activity did not occur: Safety/medical concerns ? ?Assist level: Minimal  Assistance - Patient > 75% ?Assistive device: Walker-rolling  ? ? ?Walk 150 feet activity ? ? ?Assist Walk 150 feet activity did not occur: Safety/medical concerns ? ?  ?  ?  ? ?Walk 10 feet on uneven surface  ?activity ? ? ?Assist Walk 10 feet on uneven surfaces activity did not occur: Safety/medical concerns ? ? ?  ?   ? ?Wheelchair ? ? ? ? ?Assist Is the patient using a wheelchair?: Yes ?Type of Wheelchair: Manual ?  ? ?Wheelchair assist level: Minimal Assistance - Patient > 75% ?Max wheelchair distance: 150   ? ? ?Wheelchair 50 feet with 2 turns activity ? ? ? ?Assist ? ?  ?  ? ? ?Assist Level: Minimal Assistance - Patient > 75%  ? ?Wheelchair 150 feet activity  ? ? ? ?Assist ?   ? ? ?Assist Level: Minimal Assistance - Patient > 75%  ? ? ?Medical Problem List and Plan: ?1. Functional deficits secondary to peripheral arterial disease, RIght 1st ray, 2,3,4 toe amps due to osteomyelitis with pop art stenting Right side surgery and acute pulmonary embolus. ?Continue CIR PT, OT - ELOS 5/3 ? ?2.  Antithrombotics: ?-DVT/anticoagulation:  Pharmaceutical: had PE now on Eliquis- no sign of surgical site bleeding  ?            -antiplatelet therapy: Aspirin 81 mg daily ?3. Pain Management: Tylenol, tramadol as needed ? Monitor with increased exertion ?4. Onchomycosis of left toes: antifungal cream ordered BID.  ?5. Neuropsych: This patient is capable of making decisions on his own behalf. ?6. Right foot incision: ?            --Daily dry dressing change ?7. Fluids/Electrolytes/Nutrition: Routine I's and O's ?            -- Carb modified diet ?8: PAD: Status post Right popliteal stent placement ?            -- Continue aspirin while on Eliquis.  Stop Plavix. ?            -- Follow-up with Dr. Lucky Cowboy with ABIs ?            -- Status post right partial first ray amputation  ?            -- Right heel weightbearing with postop shoe- trial of darco sandal PT evaluating use  ?No swelling no  ?9: prior CVA: ?10: Dyslipidemia: Continue Lipitor 40 mg daily ?11: Acute PE: Continue Eliquis ?12: Hypertension: Continue Norvasc 10 mg daily ?Vitals:  ? 05/19/21 1920 05/20/21 0310  ?BP: 134/84 (!) 141/79  ?Pulse: 81 67  ?Resp: 15 17  ?Temp: 98.5 ?F (36.9 ?C) 98.1 ?F (36.7 ?C)  ?SpO2: 100% 100%  ? Controlled 4/28 ? ?13: DM type II with peripheral neuropathy  Restarted metformin 250mg  daily.  ? CBG (last 3) will order , pt states he has no CBG monitor at home  ?Recent Labs  ?  05/19/21 ?1643 05/19/21 ?2110 05/20/21 ?0613  ?GLUCAP 165* 187* 97   ? ?Controlled 4/28 ? ?14: Seizure disorder maintained on Keppra: Continue 500 mg daily, ? If PCP managed this in past  ?15: Constipation: give sorbitol times once dose now ?            --schedule senna BID, LBM 4/24, give sorbitol ?16: Urinary retention: d/c foley ?            -- Continue Flomax ? Improved ?17: Chronic diastolic congestive heart failure, euvolemic, no diuretics ?            --  Grade 1 diastolic dysfunction on recent echo ?18.  Hyponatremia ? Sodium 130 on 4/12, labs ordered for tomorrow ?19.  Bradykinesia , masked facies, min cogwheeling, no hx PD , no showed for appt with  Dr Jaynee Eagles- would rec f/u post hospitalization   ?Hx of remote RIght subcortical infarcts which may cause a parkinson like syndrome  , seizure d/o ?LOS: 14 days ?A FACE TO FACE EVALUATION WAS PERFORMED ? ?Luanna Salk Korie Streat ?05/20/2021, 7:27 AM ? ?

## 2021-05-20 NOTE — Progress Notes (Signed)
Occupational Therapy Session Note ? ?Patient Details  ?Name: [name] ?MRN: [id number] ?Date of Birth: 1960/05/07 ? ?Today's Date: 05/20/2021 ?OT Individual Time: 8979-1504 ?OT Individual Time Calculation (min): 68 min  ? ? ?Short Term Goals: ?Week 1:  OT Short Term Goal 1 (Week 1): pt will complete >2 standing grooming task with CGA ?OT Short Term Goal 1 - Progress (Week 1): Met ?OT Short Term Goal 2 (Week 1): Pt will complete ambulatory toilet transfer with min A and LRAD ?OT Short Term Goal 2 - Progress (Week 1): Not met ?OT Short Term Goal 3 (Week 1): Pt will don pants with mod A ?OT Short Term Goal 3 - Progress (Week 1): Not met ?OT Short Term Goal 4 (Week 1): Pt will assist with donning surgical shoe wwith no more than mod A. ?OT Short Term Goal 4 - Progress (Week 1): Not met ?OT Short Term Goal 5 (Week 1): Pt will complete ambulatory toilet transfer with min A and LRAD ?Week 2:  OT Short Term Goal 1 (Week 2): Pt will assist with donning surgical shoe wwith no more than mod A. ?OT Short Term Goal 2 (Week 2): Pt will don pants with mod A ?OT Short Term Goal 3 (Week 2): Pt will don shirt with S. ? ?Skilled Therapeutic Interventions/Progress Updates:  ?  Pt received seated in recliner, denies pain, agreeable to therapy. Session focus on self-care retraining, activity tolerance, transfer retraining, dynamic standing balance in prep for improved ADL/IADL/func mobility performance + decreased caregiver burden. Declines need for ADL at this time. Sit to stand and short ambulatory transfers throughout session from min A to power up to close S with RW. Pt now wearing surgical shoe vs offloading shoe due to improved safety/balance.  ? ?Pt practiced self-propelled w/c with BUE and LLE > nurses station with overall mod A due to veering to the R and running into obstacles. ? ?Participated in various therax to promote upright posture, dynamic standing balance, and improved sit to stand performance including: ? -placing  horse shoes above head height ? -tossing horse shoes from various positions around the gym ? - sit to stands from lowest height on mat table, much improved performance than last week with pt mostly being able to come up with cues for upright posture ?  ?Finally, practiced donning and doffing surgical shoe, pt does best with use of 5 inch foot stool, but continues to require assist to initially thread velcro strap into loop and to sufficiently tighten straps. Trialled use of reacher/dressing stick with not much success in improving performance. ? ?Pt left seated in recliner with safety belt alarm engaged, call bell in reach, and all immediate needs met.  ? ? ?Therapy Documentation ?Precautions:  ?Precautions ?Precautions: Fall ?Precaution Comments: baseline R HP and cognitive deficits ?Restrictions ?Weight Bearing Restrictions: Yes ?RLE Weight Bearing: Partial weight bearing ?Other Position/Activity Restrictions: heel WB with surgical shoe ? ?Pain: ? denies ?ADL: See Care Tool for more details. ? ? ?Therapy/Group: Individual Therapy ? ?Volanda Napoleon MS, OTR/L ? ?05/20/2021, 6:52 AM ?

## 2021-05-20 NOTE — Progress Notes (Signed)
Physical Therapy Weekly Progress Note ? ?Patient Details  ?Name: [name] ?MRN: [id number] ?Date of Birth: 02-Jul-1960 ? ?Beginning of progress report period: May 13, 2021 ?End of progress report period: May 20, 2021 ? ?Today's Date: 05/20/2021 ?PT Individual Time: 7371-0626 ?PT Individual Time Calculation (min): 43 min  ? ?Patient has met 3 of 4 short term goals.  Pt is making slow progress towards LTG of CGA-supervision assist from PT. Pt continues to demonstrate ridged slow movement patterns requiring assist to atain standing and for improved safety on steps to access home. [facility] education has not been initiate at this point, but pt will most likely require occasional assist to perform transfers safely at home and to manage staircase to enter home.  ? ?Patient continues to demonstrate the following deficits muscle weakness and muscle joint tightness, decreased cardiorespiratoy endurance, impaired timing and sequencing and unbalanced muscle activation, decreased awareness, decreased problem solving, decreased memory, and delayed processing, and decreased standing balance, decreased balance strategies, and difficulty maintaining precautions and therefore will continue to benefit from skilled PT intervention to increase functional independence with mobility. ? ?Patient progressing toward long term goals..  Continue plan of care. ? ?PT Short Term Goals ?Week 2:  PT Short Term Goal 1 (Week 2): Pt will ambulate 176f with CGA and LRAD ?PT Short Term Goal 1 - Progress (Week 2): Met ?PT Short Term Goal 2 (Week 2): pt will ascend  8 steps with min assist consistently ?PT Short Term Goal 2 - Progress (Week 2): Progressing toward goal ?PT Short Term Goal 3 (Week 2): [facility] ?PT Short Term Goal 3 - Progress (Week 2): Met ?PT Short Term Goal 4 (Week 2): Pt will propell WC 1042fwith supervision assist ?PT Short Term Goal 4 - Progress (Week 2): Met ?Week 3:  PT Short Term  Goal 1 (Week 3): STG=LTG due to ELOS ? ?Skilled Therapeutic Interventions/Progress Updates:  ?Session 1  ?Pt received supine in bed and agreeable to PT. Supine>sit transfer with supervision  assist and cues for roll then push into sitting EOB.  ? ?Lower body dresssing with min assist to manage pants over BLE and to prevent posterior LOB when pulling pants to waist. ? ?Stand pivot tranfers to WC from elevated bed height with min-CGA for safety and improve anterior weight shift to power into standing.  ? ?Seated trunkal NMR to improve rotational movement to toss ball off rebounder x 10 BUE , standing balance and trunkal NMR to perform trunkal rotation to toss ball toward rebouder, unable to catch 50% of attempts. Min assist for balance. Standing forward and posterior reach with trunkal rotation x 10 Bil. Min assist for improved ROM and amplitude of movement.  ? ?Patient returned to room and performed stand pivot to recliner with supervision assist and RW. Pt left sitting in recliner with call bell in reach and all needs met.   ? ?Session 2.  ?Pt received supine in bed and agreeable to PT. Supine>sit transfer with supervision  assist and cues for BLE position at EOB. Stand pivot transfer to recliner. Pt reports need for urination. Ambulatory transfer to bathroom with RW and CGA from PT for safety. Pt able to manage clothing with min assist for balance. Patient returned to room and performed stand pivot to recliner with RW and CGA. Pt left sitting in recliner with call bell in reach and all needs met.   ? ? ?   ? ?Therapy Documentation ?  Precautions:  ?Precautions ?Precautions: Fall ?Precaution Comments: baseline R HP and cognitive deficits ?Restrictions ?Weight Bearing Restrictions: Yes ?RLE Weight Bearing: Partial weight bearing ?Other Position/Activity Restrictions: heel WB with surgical shoe ? ? ?Pain: ?Denies in session 1 and 2 ? ? ?Therapy/Group: Individual Therapy ? ?[name] ?05/20/2021, 8:50 AM  ?

## 2021-05-20 NOTE — Progress Notes (Signed)
Speech Language Pathology Daily Session Note ? ?Patient Details  ?Name: [name] ?MRN: [id number] ?Date of Birth: 09/11/1960 ? ?Today's Date: 05/20/2021 ?SLP Individual Time: 3374-4514 ?SLP Individual Time Calculation (min): 45 min ? ?Short Term Goals: ?Week 2: SLP Short Term Goal 1 (Week 2): STG=LTG due to ELOS ? ?Skilled Therapeutic Interventions: Skilled ST treatment focused on cognitive goals. SLP facilitated session by providing sup A verbal cues to orient to date using external memory aid via memory notebook. SLP facilitated basic problem solving and verbal reasoning with supervision A verbal cues; responding to hypothetical scenarios with min A verbal cues. Pt engaged in discussion regarding emergent awareness where pt verbalized current limitations and areas of anticipated need upon discharge with overall sup A verbal cues. Pt was also able to verbalize safety techniques learned in PT/OT with sup A verbal cues to elaborate to help him safely stand/sit including push off with arms when standing, and reach back to chair and feel surface on back of legs prior to sitting. Patient was left in wheelchair with alarm activated and immediate needs within reach at end of session. Continue per current plan of care.   ?   ?Pain ?Pain Assessment ?Pain Scale: 0-10 ?Pain Score: 0-No pain ? ?Therapy/Group: Individual Therapy ? ?Jru Pense T Skanda Worlds ?05/20/2021, 3:55 PM ?

## 2021-05-21 LAB — BASIC METABOLIC PANEL
Anion gap: 10 (ref 5–15)
BUN: 22 mg/dL — ABNORMAL HIGH (ref 6–20)
CO2: 22 mmol/L (ref 22–32)
Calcium: 8.8 mg/dL — ABNORMAL LOW (ref 8.9–10.3)
Chloride: 97 mmol/L — ABNORMAL LOW (ref 98–111)
Creatinine, Ser: 1 mg/dL (ref 0.61–1.24)
GFR, Estimated: >60 mL/min (ref >60)
Glucose, Bld: 197 mg/dL — ABNORMAL HIGH (ref 70–99)
Potassium: 3.7 mmol/L (ref 3.5–5.1)
Sodium: 129 mmol/L — ABNORMAL LOW (ref 135–145)

## 2021-05-21 LAB — URINALYSIS, ROUTINE W REFLEX MICROSCOPIC
Bilirubin Urine: NEGATIVE
Glucose, UA: NEGATIVE mg/dL
Ketones, ur: NEGATIVE mg/dL
Nitrite: POSITIVE — AB
Protein, ur: NEGATIVE mg/dL
Specific Gravity, Urine: 1.02 (ref 1.005–1.030)
pH: 5 (ref 5.0–8.0)

## 2021-05-21 LAB — GLUCOSE, CAPILLARY
Glucose-Capillary: 162 mg/dL — ABNORMAL HIGH (ref 70–99)
Glucose-Capillary: 168 mg/dL — ABNORMAL HIGH (ref 70–99)
Glucose-Capillary: 185 mg/dL — ABNORMAL HIGH (ref 70–99)
Glucose-Capillary: 250 mg/dL — ABNORMAL HIGH (ref 70–99)

## 2021-05-21 MED ORDER — CEPHALEXIN 250 MG PO CAPS
500.0000 mg | ORAL_CAPSULE | Freq: Two times a day (BID) | ORAL | Status: DC
  Administered 2021-05-21: 500 mg via ORAL
  Filled 2021-05-21: qty 2

## 2021-05-21 NOTE — Progress Notes (Signed)
Physical Therapy Session Note ? ?Patient Details  ?Name: [name] ?MRN: [id number] ?Date of Birth: Oct 22, 1960 ? ?Today's Date: 05/21/2021 ?PT Individual Time: [phone] ?PT Individual Time Calculation (min): 53 min  ? ?Short Term Goals: ?Week 1:  PT Short Term Goal 1 (Week 1): pt will perform bed mobility with CGA ?PT Short Term Goal 1 - Progress (Week 1): Met ?PT Short Term Goal 2 (Week 1): Pt will trasnfer to [facility] with min assist consistently ?PT Short Term Goal 2 - Progress (Week 1): Met ?PT Short Term Goal 3 (Week 1): Pt will ambulate 128f with min assist and LRAD ?PT Short Term Goal 3 - Progress (Week 1): Met ?PT Short Term Goal 4 (Week 1): Pt will ascend 8 steps with min assist ?PT Short Term Goal 4 - Progress (Week 1): Progressing toward goal ?Week 2:  PT Short Term Goal 1 (Week 2): Pt will ambulate 1266fwith CGA and LRAD ?PT Short Term Goal 1 - Progress (Week 2): Met ?PT Short Term Goal 2 (Week 2): pt will ascend  8 steps with min assist consistently ?PT Short Term Goal 2 - Progress (Week 2): Progressing toward goal ?PT Short Term Goal 3 (Week 2): [facility] ?PT Short Term Goal 3 - Progress (Week 2): Met ?PT Short Term Goal 4 (Week 2): Pt will propell WC 10028fith supervision assist ?PT Short Term Goal 4 - Progress (Week 2): Met ?Week 3:  PT Short Term Goal 1 (Week 3): STG=LTG due to ELOS ? ? ?Skilled Therapeutic Interventions/Progress Updates:  ? ?Pt received supine in bed and agreeable to PT. Supine>sit transfer with max assist and cues for seqeucnign. Mod-max assist sitting balance EOB. Attempted sit<>stand with RW and stedy, but pt unable to power into standing on this day through 5 attempts ? ? ?Pt reported lightheadness sitting EOB. VS assessment sitting. 151/80. HR 93. SpO2. Additional sitting BP 118/69, HR 86. SpO2 100. Supine BP 117/68, HR 85. And after 3 minutes. 118/69, HR 86. ? ?Rolling in Bed R and L to removed soiled brief and don clean x 2 and  additional NMR through rolling R and L x 5 each with mod assist to the L and min assist to the R. ?Sit>supine with max assist VS assessed 117/68 HR 85. Returned to supine with max assist.  ? ?Supine NMR for SAQ, hip abduction/adduction, SLR. X 8 BLE withcues for and intermittent assist for increased ROM.  ? ?MD made aware of functional decline. ? ?Left supine in bed with RN present.   ?   ? ?Therapy Documentation ?Precautions:  ?Precautions ?Precautions: Fall ?Precaution Comments: baseline R HP and cognitive deficits ?Restrictions ?Weight Bearing Restrictions: Yes ?RLE Weight Bearing: Partial weight bearing ?Other Position/Activity Restrictions: heel WB with surgical shoe ? ?Vital Signs: ? See above ?Pain: ?  denies ? ? ?Therapy/Group: Individual Therapy ? ?AusLorie Phenix/29/2023, 8:49 AM  ?

## 2021-05-21 NOTE — Progress Notes (Signed)
Occupational Therapy Note ? ?Patient Details  ?Name: [name] ?MRN: [id number] ?Date of Birth: 1960/08/29 ? ?Today's Date: 05/21/2021 ?OT Missed Time: 60 Minutes ?Missed Time Reason: Patient fatigue ? ?Attempted to see pt for scheduled OT session. Pt received semi-reclined in bed, reporting feeling week and unable to stand this am with PT. BP seated read at 137/75 (93). Able to lift BUE/BLE against gravity and answer questions, but appears very fatigued, possibly difficulty opening R eye. Pt requesting to rest at this time. MD notified and aware of pt status. ? ?Volanda Napoleon MS, OTR/L ? ?05/21/2021, 10:23 AM ?

## 2021-05-21 NOTE — Progress Notes (Signed)
Occupational Therapy Weekly Progress Note ? ?Patient Details  ?Name: [name] ?MRN: [id number] ?Date of Birth: 1960-07-25 ? ?Beginning of progress report period: May 14, 2021 ?End of progress report period: May 21, 2021 ? ? ? ?Patient has met 3 of 3 short term goals.  Pt has made steady progress this week towards LTG. Pt has demonstrated improved ability to come into standing, dynamic standing balance, activity tolerance, ability to compensate for deficits to presently complete seated UB ADL at set-up A to S, LBD at mod A level, donning shoes with mod to max A, bathing at mod A, and ambulatory bathroom transfers with min A and use of RW. Pt cont to be primarily limited by chronic R HP, baseline cognitive deficits, difficulty coming into standing. On 4/29 pt with episode of difficulty getting out of bed and significant weakness/fatigue, per MD possible UTI. Anticipate 24/7 S and min physical assist required upon DC. No family present yet for family education. ? ? ?Patient continues to demonstrate the following deficits: muscle weakness, decreased cardiorespiratoy endurance, impaired timing and sequencing, unbalanced muscle activation, and decreased coordination, decreased attention, decreased awareness, decreased problem solving, decreased safety awareness, decreased memory, and delayed processing, and decreased sitting balance, decreased standing balance, decreased postural control, and difficulty maintaining precautions and therefore will continue to benefit from skilled OT intervention to enhance overall performance with BADL and Reduce care partner burden. ? ?Patient progressing toward long term goals..  Continue plan of care. ? ?OT Short Term Goals ?Week 1:  OT Short Term Goal 1 (Week 1): pt will complete >2 standing grooming task with CGA ?OT Short Term Goal 1 - Progress (Week 1): Met ?OT Short Term Goal 2 (Week 1): Pt will complete ambulatory toilet transfer with min A and LRAD ?OT Short Term Goal 2 -  Progress (Week 1): Not met ?OT Short Term Goal 3 (Week 1): Pt will don pants with mod A ?OT Short Term Goal 3 - Progress (Week 1): Not met ?OT Short Term Goal 4 (Week 1): Pt will assist with donning surgical shoe wwith no more than mod A. ?OT Short Term Goal 4 - Progress (Week 1): Not met ?OT Short Term Goal 5 (Week 1): Pt will complete ambulatory toilet transfer with min A and LRAD ?Week 2:  OT Short Term Goal 1 (Week 2): Pt will assist with donning surgical shoe wwith no more than mod A. ?OT Short Term Goal 1 - Progress (Week 2): Met ?OT Short Term Goal 2 (Week 2): Pt will don pants with mod A ?OT Short Term Goal 2 - Progress (Week 2): Met ?OT Short Term Goal 3 (Week 2): Pt will don shirt with S. ?OT Short Term Goal 3 - Progress (Week 2): Met ?Week 3:  OT Short Term Goal 1 (Week 3): STG = LTG 2/2 ELOS ? ?Volanda Napoleon MS, OTR/L ? ?05/21/2021, 12:48 PM  ?

## 2021-05-21 NOTE — Progress Notes (Addendum)
[place] PHYSICAL MEDICINE & REHABILITATION PROGRESS NOTE ? ?Subjective/Complaints: ? ?No Pain issues, slept poorly freq urination , no burning no fever , orthostatic drop >81mmHg per PT this am  ? ?ROS: Denies CP, SOB, N/V/D ? ? ?Objective: ?Vital Signs: ?Blood pressure 137/71, pulse 82, temperature 98.6 ?F (37 ?C), temperature source Oral, resp. rate 18, height 5\' 7"  (1.702 m), weight 71.2 kg, SpO2 100 %. ?No results found. ?No results for input(s): WBC, HGB, HCT, PLT in the last 72 hours. ? ?No results for input(s): NA, K, CL, CO2, GLUCOSE, BUN, CREATININE, CALCIUM in the last 72 hours. ? ?No intake or output data in the 24 hours ending 05/21/21 0807 ?  ? ?  ? ?Physical Exam: ?BP 137/71 (BP Location: Left Arm)   Pulse 82   Temp 98.6 ?F (37 ?C) (Oral)   Resp 18   Ht 5\' 7"  (1.702 m)   Wt 71.2 kg   SpO2 100%   BMI 24.58 kg/m?  ? ?General: No acute distress ?Mood and affect are appropriate ?Heart: Regular rate and rhythm no rubs murmurs or extra sounds ?Lungs: Clear to auscultation, breathing unlabored, no rales or wheezes ?Abdomen: Positive bowel sounds, soft nontender to palpation, nondistended ? ? ?Skin: Warm and dry.  Intact.RIgh t1st ray amp site CDI no erythema ?4/24 ? ? ? ? ?Psych: Normal behavior.  Normal affect. ?Musc:    Right amp incision with dressing   ?Neuro: 4-/5 throughout,  ? ?Hand intrinsic atrophy with reduced sensation in 5th fingers bilateral , also reduced sensation feet to LT , poor concentration /distractibility limits testing  ? ?Assessment/Plan: ?1. Functional deficits which require 3+ hours per day of interdisciplinary therapy in a comprehensive inpatient rehab setting. ?Physiatrist is providing close team supervision and 24 hour management of active medical problems listed below. ?Physiatrist and rehab team continue to assess barriers to discharge/monitor patient progress toward functional and medical goals ? ? ?Care Tool: ? ?Bathing ?   ?Body parts bathed by patient: Right arm,  Left arm, Chest, Abdomen, Front perineal area, Right upper leg, Left upper leg, Face  ? Body parts bathed by helper: Right lower leg, Left lower leg, Buttocks ?  ?  ?Bathing assist Assist Level: Moderate Assistance - Patient 50 - 74% (standing for washing buttocks) ?  ?  ?Upper Body Dressing/Undressing ?Upper body dressing   ?What is the patient wearing?: Pull over shirt ?   ?Upper body assist Assist Level: Supervision/Verbal cueing ?   ?Lower Body Dressing/Undressing ?Lower body dressing ? ? ?   ?What is the patient wearing?: Pants ? ?  ? ?Lower body assist Assist for lower body dressing: Minimal Assistance - Patient > 75% (reacher) ?   ? ?Toileting ?Toileting Toileting Activity did not occur (Probation officer and hygiene only): N/A (no void or bm)  ?Toileting assist Assist for toileting: Maximal Assistance - Patient 25 - 49% ?  ?  ?Transfers ?Chair/bed transfer ? ?Transfers assist ?   ? ?Chair/bed transfer assist level: Contact Guard/Touching assist ?  ?  ?Locomotion ?Ambulation ? ? ?Ambulation assist ? ?   ? ?Assist level: Minimal Assistance - Patient > 75% ?Assistive device: Walker-rolling ?Max distance: 120 ft  ? ?Walk 10 feet activity ? ? ?Assist ?   ? ?Assist level: Minimal Assistance - Patient > 75% ?Assistive device: Walker-rolling  ? ?Walk 50 feet activity ? ? ?Assist Walk 50 feet with 2 turns activity did not occur: Safety/medical concerns ? ?Assist level: Minimal Assistance - Patient > 75% ?  Assistive device: Walker-rolling  ? ? ?Walk 150 feet activity ? ? ?Assist Walk 150 feet activity did not occur: Safety/medical concerns ? ?  ?  ?  ? ?Walk 10 feet on uneven surface  ?activity ? ? ?Assist Walk 10 feet on uneven surfaces activity did not occur: Safety/medical concerns ? ? ?  ?   ? ?Wheelchair ? ? ? ? ?Assist Is the patient using a wheelchair?: Yes ?Type of Wheelchair: Manual ?  ? ?Wheelchair assist level: Minimal Assistance - Patient > 75% ?Max wheelchair distance: 150  ? ? ?Wheelchair 50 feet with 2  turns activity ? ? ? ?Assist ? ?  ?  ? ? ?Assist Level: Minimal Assistance - Patient > 75%  ? ?Wheelchair 150 feet activity  ? ? ? ?Assist ?   ? ? ?Assist Level: Minimal Assistance - Patient > 75%  ? ? ?Medical Problem List and Plan: ?1. Functional deficits secondary to peripheral arterial disease, RIght 1st ray, 2,3,4 toe amps due to osteomyelitis with pop art stenting Right side surgery and acute pulmonary embolus. ?Continue CIR PT, OT - ELOS 5/3 ? ?2.  Antithrombotics: ?-DVT/anticoagulation:  Pharmaceutical: had PE now on Eliquis- no sign of surgical site bleeding  ?            -antiplatelet therapy: Aspirin 81 mg daily ?3. Pain Management: Tylenol, tramadol as needed ? Monitor with increased exertion ?4. Onchomycosis of left toes: antifungal cream ordered BID.  ?5. Neuropsych: This patient is capable of making decisions on his own behalf. ?6. Right foot incision: ?            --Daily dry dressing change ?7. Fluids/Electrolytes/Nutrition: Routine I's and O's ?            -- Carb modified diet ?8: PAD: Status post Right popliteal stent placement ?            -- Continue aspirin while on Eliquis.  Stop Plavix. ?            -- Follow-up with Dr. Lucky Cowboy with ABIs ?            -- Status post right partial first ray amputation  ?            -- Right heel weightbearing with postop shoe- trial of darco sandal PT evaluating use  ?No swelling no  ?9: prior CVA: ?10: Dyslipidemia: Continue Lipitor 40 mg daily ?11: Acute PE: Continue Eliquis ?12: Hypertension: Continue Norvasc 10 mg daily ?Vitals:  ? 05/20/21 2031 05/21/21 0349  ?BP: 135/75 137/71  ?Pulse: 94 82  ?Resp: 18 18  ?Temp: 98.9 ?F (37.2 ?C) 98.6 ?F (37 ?C)  ?SpO2: 100% 100%  ? Controlled 4/29 but orthostatic will check BMET, order ACE wraps  ? ?13: DM type II with peripheral neuropathy  Restarted metformin 250mg  daily.  ? CBG (last 3) will order , pt states he has no CBG monitor at home  ?Recent Labs  ?  05/20/21 ?1650 05/20/21 ?2109 05/21/21 ?0602  ?GLUCAP 197* 219*  162*  ? ?Controlled 4/29 ? ?14: Seizure disorder maintained on Keppra: Continue 500 mg daily, ? If PCP managed this in past  ?15: Constipation: give sorbitol times once dose now ?            --schedule senna BID, LBM 4/24, give sorbitol ?16: Urinary retention: d/c foley ?            -- Continue Flomax ? Improved, actually with freq urination , no retention  will check UA, if neg consider anticholinergic to avoid nocturia   ?17: Chronic diastolic congestive heart failure, euvolemic, no diuretics ?            -- Grade 1 diastolic dysfunction on recent echo ?18.  Hyponatremia ? Sodium 130 on 4/12, labs ordered for tomorrow ?19.  Bradykinesia , masked facies, min cogwheeling, no hx PD , no showed for appt with  Dr Jaynee Eagles- would rec f/u post hospitalization   ?Hx of remote RIght subcortical infarcts which may cause a parkinson like syndrome  , seizure d/o ?LOS: 15 days ?A FACE TO FACE EVALUATION WAS PERFORMED ? ?Luanna Salk Javon Snee ?05/21/2021, 8:07 AM ? ?

## 2021-05-22 LAB — GLUCOSE, CAPILLARY
Glucose-Capillary: 192 mg/dL — ABNORMAL HIGH (ref 70–99)
Glucose-Capillary: 298 mg/dL — ABNORMAL HIGH (ref 70–99)
Glucose-Capillary: 213 mg/dL — ABNORMAL HIGH (ref 70–99)
Glucose-Capillary: 233 mg/dL — ABNORMAL HIGH (ref 70–99)

## 2021-05-22 MED ORDER — INSULIN ASPART 100 UNIT/ML IJ SOLN
0.0000 [IU] | Freq: Three times a day (TID) | INTRAMUSCULAR | Status: DC
  Administered 2021-05-22: 5 [IU] via SUBCUTANEOUS
  Administered 2021-05-22 – 2021-05-23 (×2): 3 [IU] via SUBCUTANEOUS
  Administered 2021-05-23: 2 [IU] via SUBCUTANEOUS
  Administered 2021-05-23: 3 [IU] via SUBCUTANEOUS
  Administered 2021-05-24: 1 [IU] via SUBCUTANEOUS
  Administered 2021-05-24 (×2): 2 [IU] via SUBCUTANEOUS
  Administered 2021-05-25: 1 [IU] via SUBCUTANEOUS
  Administered 2021-05-25 (×2): 2 [IU] via SUBCUTANEOUS
  Administered 2021-05-28: 1 [IU] via SUBCUTANEOUS
  Administered 2021-05-29: 2 [IU] via SUBCUTANEOUS
  Administered 2021-05-30 – 2021-06-02 (×2): 1 [IU] via SUBCUTANEOUS

## 2021-05-22 MED ORDER — INSULIN ASPART 100 UNIT/ML IJ SOLN
0.0000 [IU] | Freq: Every day | INTRAMUSCULAR | Status: DC
  Administered 2021-05-22: 2 [IU] via SUBCUTANEOUS
  Administered 2021-05-24: 1 [IU] via SUBCUTANEOUS
  Administered 2021-05-25: 2 [IU] via SUBCUTANEOUS

## 2021-05-22 MED ORDER — SODIUM CHLORIDE 0.9 % IV SOLN
1.0000 g | INTRAVENOUS | Status: AC
  Administered 2021-05-22 – 2021-05-26 (×5): 1 g via INTRAVENOUS
  Filled 2021-05-22 (×5): qty 10

## 2021-05-22 MED ORDER — SODIUM CHLORIDE 0.9 % IV SOLN
INTRAVENOUS | Status: DC | PRN
Start: 2021-05-22 — End: 2021-06-03
  Administered 2021-05-24 – 2021-05-25 (×2): 250 mL via INTRAVENOUS
  Administered 2021-05-25: 200 mL via INTRAVENOUS

## 2021-05-22 NOTE — Progress Notes (Signed)
[facility] PHYSICAL MEDICINE & REHABILITATION PROGRESS NOTE ? ?Subjective/Complaints: ? ?UA + , has chills this am but has been afeb  ? ?ROS: Denies CP, SOB, N/V/D ? ? ?Objective: ?Vital Signs: ?Blood pressure (!) 147/80, pulse 97, temperature 98.7 ?F (37.1 ?C), temperature source Oral, resp. rate 18, height 5\' 7"  (1.702 m), weight 71.2 kg, SpO2 100 %. ?No results found. ?No results for input(s): WBC, HGB, HCT, PLT in the last 72 hours. ? ?Recent Labs  ?  05/21/21 ?0921  ?NA 129*  ?K 3.7  ?CL 97*  ?CO2 22  ?GLUCOSE 197*  ?BUN 22*  ?CREATININE 1.00  ?CALCIUM 8.8*  ? ? ? ?Intake/Output Summary (Last 24 hours) at 05/22/2021 0086 ?Last data filed at 05/22/2021 0700 ?Gross per 24 hour  ?Intake 440 ml  ?Output 298 ml  ?Net 142 ml  ? ?  ? ?  ? ?Physical Exam: ?BP (!) 147/80 (BP Location: Left Arm)   Pulse 97   Temp 98.7 ?F (37.1 ?C) (Oral)   Resp 18   Ht 5\' 7"  (1.702 m)   Wt 71.2 kg   SpO2 100%   BMI 24.58 kg/m?  ? ?General: No acute distress ?Mood and affect are appropriate ?Heart: Regular rate and rhythm no rubs murmurs or extra sounds ?Lungs: Clear to auscultation, breathing unlabored, no rales or wheezes ?Abdomen: Positive bowel sounds, soft nontender to palpation, nondistended ? ? ?Skin: Warm and dry.  Intact.RIgh t1st ray amp site CDI no erythema ?4/24 ? ? ? ? ?Psych: Normal behavior.  Normal affect. ?Musc:    Right amp incision with dressing   ?Neuro: 4-/5 throughout,  ? ?Hand intrinsic atrophy with reduced sensation in 5th fingers bilateral , also reduced sensation feet to LT , poor concentration /distractibility limits testing  ? ?Assessment/Plan: ?1. Functional deficits which require 3+ hours per day of interdisciplinary therapy in a comprehensive inpatient rehab setting. ?Physiatrist is providing close team supervision and 24 hour management of active medical problems listed below. ?Physiatrist and rehab team continue to assess barriers to discharge/monitor patient progress toward functional and medical  goals ? ? ?Care Tool: ? ?Bathing ?   ?Body parts bathed by patient: Right arm, Left arm, Chest, Abdomen, Front perineal area, Right upper leg, Left upper leg, Face  ? Body parts bathed by helper: Right lower leg, Left lower leg, Buttocks ?  ?  ?Bathing assist Assist Level: Moderate Assistance - Patient 50 - 74% (standing for washing buttocks) ?  ?  ?Upper Body Dressing/Undressing ?Upper body dressing   ?What is the patient wearing?: Pull over shirt ?   ?Upper body assist Assist Level: Supervision/Verbal cueing ?   ?Lower Body Dressing/Undressing ?Lower body dressing ? ? ?   ?What is the patient wearing?: Pants ? ?  ? ?Lower body assist Assist for lower body dressing: Minimal Assistance - Patient > 75% (reacher) ?   ? ?Toileting ?Toileting Toileting Activity did not occur (Probation officer and hygiene only): N/A (no void or bm)  ?Toileting assist Assist for toileting: Maximal Assistance - Patient 25 - 49% ?  ?  ?Transfers ?Chair/bed transfer ? ?Transfers assist ?   ? ?Chair/bed transfer assist level: Contact Guard/Touching assist ?  ?  ?Locomotion ?Ambulation ? ? ?Ambulation assist ? ?   ? ?Assist level: Minimal Assistance - Patient > 75% ?Assistive device: Walker-rolling ?Max distance: 120 ft  ? ?Walk 10 feet activity ? ? ?Assist ?   ? ?Assist level: Minimal Assistance - Patient > 75% ?Assistive device: Walker-rolling  ? ?  Walk 50 feet activity ? ? ?Assist Walk 50 feet with 2 turns activity did not occur: Safety/medical concerns ? ?Assist level: Minimal Assistance - Patient > 75% ?Assistive device: Walker-rolling  ? ? ?Walk 150 feet activity ? ? ?Assist Walk 150 feet activity did not occur: Safety/medical concerns ? ?  ?  ?  ? ?Walk 10 feet on uneven surface  ?activity ? ? ?Assist Walk 10 feet on uneven surfaces activity did not occur: Safety/medical concerns ? ? ?  ?   ? ?Wheelchair ? ? ? ? ?Assist Is the patient using a wheelchair?: Yes ?Type of Wheelchair: Manual ?  ? ?Wheelchair assist level: Minimal  Assistance - Patient > 75% ?Max wheelchair distance: 150  ? ? ?Wheelchair 50 feet with 2 turns activity ? ? ? ?Assist ? ?  ?  ? ? ?Assist Level: Minimal Assistance - Patient > 75%  ? ?Wheelchair 150 feet activity  ? ? ? ?Assist ?   ? ? ?Assist Level: Minimal Assistance - Patient > 75%  ? ? ?Medical Problem List and Plan: ?1. Functional deficits secondary to peripheral arterial disease, RIght 1st ray, 2,3,4 toe amps due to osteomyelitis with pop art stenting Right side surgery and acute pulmonary embolus. ?Continue CIR PT, OT - ELOS 5/3 ? ?2.  Antithrombotics: ?-DVT/anticoagulation:  Pharmaceutical: had PE now on Eliquis- no sign of surgical site bleeding  ?            -antiplatelet therapy: Aspirin 81 mg daily ?3. Pain Management: Tylenol, tramadol as needed ? Monitor with increased exertion ?4. Onchomycosis of left toes: antifungal cream ordered BID.  ?5. Neuropsych: This patient is capable of making decisions on his own behalf. ?6. Right foot incision: ?            --Daily dry dressing change ?7. Fluids/Electrolytes/Nutrition: Routine I's and O's ?            -- Carb modified diet ?8: PAD: Status post Right popliteal stent placement ?            -- Continue aspirin while on Eliquis.  Stop Plavix. ?            -- Follow-up with Dr. Lucky Cowboy with ABIs ?            -- Status post right partial first ray amputation  ?            -- Right heel weightbearing with postop shoe- trial of darco sandal PT evaluating use  ?No swelling no  ?9: prior CVA: ?10: Dyslipidemia: Continue Lipitor 40 mg daily ?11: Acute PE: Continue Eliquis ?12: Hypertension: Continue Norvasc 10 mg daily ?Vitals:  ? 05/21/21 2046 05/22/21 0414  ?BP: (!) 149/85 (!) 147/80  ?Pulse: (!) 101 97  ?Resp: 18 18  ?Temp: 99.3 ?F (37.4 ?C) 98.7 ?F (37.1 ?C)  ?SpO2: 100% 100%  ? Controlled 4/29 but orthostatic will check BMET, order ACE wraps  ? ?13: DM type II with peripheral neuropathy  Restarted metformin 250mg  daily.  ? CBG (last 3) will order , pt states he has  no CBG monitor at home  ?Recent Labs  ?  05/21/21 ?1627 05/21/21 ?2059 05/22/21 ?7654  ?GLUCAP 185* 250* 192*  ? ?Controlled 4/30 ? ?14: Seizure disorder maintained on Keppra: Continue 500 mg daily, ? If PCP managed this in past  ?15: Constipation: give sorbitol times once dose now ?            --schedule senna BID, LBM 4/24,  give sorbitol ?16: Urinary retention: d/c foley ?            -- Continue Flomax ? Improved, actually with freq urination , no retention will check UA, if neg consider anticholinergic to avoid nocturia   ?17: Chronic diastolic congestive heart failure, euvolemic, no diuretics ?            -- Grade 1 diastolic dysfunction on recent echo ?18.  Hyponatremia ? Sodium 130 on 4/12, labs ordered for tomorrow ?19.  Bradykinesia , masked facies, min cogwheeling, no hx PD , no showed for appt with  Dr Jaynee Eagles- would rec f/u post hospitalization   ?Hx of remote RIght subcortical infarcts which may cause a parkinson like syndrome  , seizure d/o ?LOS: 16 days ?A FACE TO FACE EVALUATION WAS PERFORMED ? ?Luanna Salk Loraine Bhullar ?05/22/2021, 8:22 AM ? ?

## 2021-05-22 NOTE — Progress Notes (Signed)
Ok to add SSI for CBGs per Dr. Letta Pate. ? ?Onnie Boer, PharmD, BCIDP, AAHIVP, CPP ?Infectious Disease Pharmacist ?05/22/2021 10:23 AM ? ? ?

## 2021-05-22 NOTE — Progress Notes (Signed)
Pt unable to ambulate this am. NT & nurse attempted to get patient up to bathroom with steady and pt unable to hold himself up. Pt placed back in bed.  ? ?Vitals taken and MD contacted. Pt is to remain in bed today and IV antibiotics to be started.  ?

## 2021-05-23 LAB — BASIC METABOLIC PANEL
Anion gap: 7 (ref 5–15)
BUN: 24 mg/dL — ABNORMAL HIGH (ref 6–20)
CO2: 25 mmol/L (ref 22–32)
Calcium: 8.9 mg/dL (ref 8.9–10.3)
Chloride: 102 mmol/L (ref 98–111)
Creatinine, Ser: 0.98 mg/dL (ref 0.61–1.24)
GFR, Estimated: >60 mL/min (ref >60)
Glucose, Bld: 176 mg/dL — ABNORMAL HIGH (ref 70–99)
Potassium: 3.5 mmol/L (ref 3.5–5.1)
Sodium: 134 mmol/L — ABNORMAL LOW (ref 135–145)

## 2021-05-23 LAB — GLUCOSE, CAPILLARY
Glucose-Capillary: 201 mg/dL — ABNORMAL HIGH (ref 70–99)
Glucose-Capillary: 208 mg/dL — ABNORMAL HIGH (ref 70–99)
Glucose-Capillary: 172 mg/dL — ABNORMAL HIGH (ref 70–99)
Glucose-Capillary: 185 mg/dL — ABNORMAL HIGH (ref 70–99)

## 2021-05-23 MED ORDER — METFORMIN HCL 500 MG PO TABS
500.0000 mg | ORAL_TABLET | Freq: Two times a day (BID) | ORAL | Status: DC
Start: 2021-05-23 — End: 2021-06-03
  Administered 2021-05-23 – 2021-06-03 (×22): 500 mg via ORAL
  Filled 2021-05-23 (×22): qty 1

## 2021-05-23 NOTE — Progress Notes (Signed)
Occupational Therapy Session Note ? ?Patient Details  ?Name: [name] ?MRN: [id number] ?Date of Birth: 1960-09-08 ? ?Today's Date: 05/23/2021 ?Session 1 ?OT Individual Time: 7096-2836 ?OT Individual Time Calculation (min): 42 min  ? ?Session 2 ?OT Individual Time: 6294-7654 ?OT Individual Time Calculation (min): 45 min  ? ? ?Short Term Goals: ?Week 2:  OT Short Term Goal 1 (Week 2): Pt will assist with donning surgical shoe wwith no more than mod A. ?OT Short Term Goal 1 - Progress (Week 2): Met ?OT Short Term Goal 2 (Week 2): Pt will don pants with mod A ?OT Short Term Goal 2 - Progress (Week 2): Met ?OT Short Term Goal 3 (Week 2): Pt will don shirt with S. ?OT Short Term Goal 3 - Progress (Week 2): Met ? ?Skilled Therapeutic Interventions/Progress Updates:  ?  Session 1 ?Pt greeted semi-reclined in bed and agreeable to OT treatment session. Pt reported feeling better overall and denied any pain. Pt agreeable to bathing/tasks. Worked on bed mobility with pt very slow to bring LE's to EOB and increased time to initiate. Pt with very weak core and unable to  push up to sitting requiring max A from OT to get to EOB. Pt with posterior bias and diffculty maintaining anterior weight shift at EOB even with B UE supported on hand rail and foot board moved closer. Pt able to remove R UE from rail to assist with bathing tasks but required mod A and constant cues to maintain anterior weight shift, overall mod A for UB bathing tasks due to sitting balance deficits. Unable to attempt stand safely this morniing, so pt returned to bed with max A. Max A for OT to thread pant legs, then worked on bridging to pull up pants with pt able to to work pants up a little towards hips, but required rolling with increased time and mod A to get pants over hips. Bed placed in trendelenburg to scoot toward [name], but pt able to assist using UE's pulling on rail. Pt left semi-reclined in bed with needs met.  ? ?Session 2 ?Pt greeted sitting in  recliner and attempting to get up. Pt declined eating lunch and had not touched any of his food. Pt agreeable to try to stand using RW. PT with improved anterior weight shift, but difficulty sequencing commands and was unable to power up despite multimodal cues, 4 trials and max A. OT then trialed Stedy. Pt was able to power up just enough for Stedy paddles to get behind pt, but hips and torso still extremely flexed. Pt brought to the sink in [place] and worked on trunk head and neck control  to sit perched on stedy and look up into mirror. Pt then completed 2 sit<>stands from perched stedy seat with mod A and improved hip extension from this position. Using [place], pt returned to EOB. Pt took extended rest break, then attempted 2 more sit<>stands from raised bed height, but pt too fatigued and unable to initiate stand. Max A to return to supine. Pt reported max fatigue and was left semi-reclined in bed with bed alarm on, call bell in reach, and needs met.  ? ?Therapy Documentation ?Precautions:  ?Precautions ?Precautions: Fall ?Precaution Comments: baseline R HP and cognitive deficits ?Restrictions ?Weight Bearing Restrictions: Yes ?RLE Weight Bearing: Partial weight bearing ?Other Position/Activity Restrictions: heel WB with surgical shoe ?Pain ?Denies pain ?OT MISSED TIME; 15 minutes due to fatigue ? ? ?Therapy/Group: Individual Therapy ? ?Daneen Schick Skyley Grandmaison ?05/23/2021, 1:48 PM ?

## 2021-05-23 NOTE — Progress Notes (Signed)
[facility] PHYSICAL MEDICINE & REHABILITATION PROGRESS NOTE ? ?Subjective/Complaints: ?No new complaints this morning ?Working with SLP ?Denies pain ?Moving bowels ? ?ROS: Denies CP, SOB, N/V/D ? ? ?Objective: ?Vital Signs: ?Blood pressure 128/79, pulse 92, temperature 98.6 ?F (37 ?C), temperature source Oral, resp. rate 18, height 5\' 7"  (1.702 m), weight 71.2 kg, SpO2 97 %. ?No results found. ?No results for input(s): WBC, HGB, HCT, PLT in the last 72 hours. ? ?Recent Labs  ?  05/21/21 ?0921 05/23/21 ?0650  ?NA 129* 134*  ?K 3.7 3.5  ?CL 97* 102  ?CO2 22 25  ?GLUCOSE 197* 176*  ?BUN 22* 24*  ?CREATININE 1.00 0.98  ?CALCIUM 8.8* 8.9  ? ? ?Intake/Output Summary (Last 24 hours) at 05/23/2021 1048 ?Last data filed at 05/23/2021 0700 ?Gross per 24 hour  ?Intake 894 ml  ?Output 400 ml  ?Net 494 ml  ?  ? ?  ? ?Physical Exam: ?BP 128/79 (BP Location: Left Arm)   Pulse 92   Temp 98.6 ?F (37 ?C) (Oral)   Resp 18   Ht 5\' 7"  (1.702 m)   Wt 71.2 kg   SpO2 97%   BMI 24.58 kg/m?  ?Gen: no distress, normal appearing ?HEENT: oral mucosa pink and moist, NCAT ?Cardio: Reg rate ?Chest: normal effort, normal rate of breathing ?Abd: soft, non-distended ?Ext: no edema ?Psych: pleasant, normal affect ? ? ?Skin: Warm and dry.  Intact.RIgh t1st ray amp site CDI no erythema ?4/24 ? ? ? ? ?Psych: Normal behavior.  Normal affect. ?Musc:    Right amp incision with dressing   ?Neuro: 4-/5 throughout,  ? ?Hand intrinsic atrophy with reduced sensation in 5th fingers bilateral , also reduced sensation feet to LT , poor concentration /distractibility limits testing  ? ?Assessment/Plan: ?1. Functional deficits which require 3+ hours per day of interdisciplinary therapy in a comprehensive inpatient rehab setting. ?Physiatrist is providing close team supervision and 24 hour management of active medical problems listed below. ?Physiatrist and rehab team continue to assess barriers to discharge/monitor patient progress toward functional and medical  goals ? ? ?Care Tool: ? ?Bathing ?   ?Body parts bathed by patient: Right arm, Left arm, Chest, Abdomen, Front perineal area, Right upper leg, Left upper leg, Face  ? Body parts bathed by helper: Right lower leg, Left lower leg, Buttocks ?  ?  ?Bathing assist Assist Level: Moderate Assistance - Patient 50 - 74% (standing for washing buttocks) ?  ?  ?Upper Body Dressing/Undressing ?Upper body dressing   ?What is the patient wearing?: Pull over shirt ?   ?Upper body assist Assist Level: Supervision/Verbal cueing ?   ?Lower Body Dressing/Undressing ?Lower body dressing ? ? ?   ?What is the patient wearing?: Pants ? ?  ? ?Lower body assist Assist for lower body dressing: Minimal Assistance - Patient > 75% (reacher) ?   ? ?Toileting ?Toileting Toileting Activity did not occur (Probation officer and hygiene only): N/A (no void or bm)  ?Toileting assist Assist for toileting: Maximal Assistance - Patient 25 - 49% ?  ?  ?Transfers ?Chair/bed transfer ? ?Transfers assist ?   ? ?Chair/bed transfer assist level: Contact Guard/Touching assist ?  ?  ?Locomotion ?Ambulation ? ? ?Ambulation assist ? ?   ? ?Assist level: Minimal Assistance - Patient > 75% ?Assistive device: Walker-rolling ?Max distance: 120 ft  ? ?Walk 10 feet activity ? ? ?Assist ?   ? ?Assist level: Minimal Assistance - Patient > 75% ?Assistive device: Walker-rolling  ? ?Walk  50 feet activity ? ? ?Assist Walk 50 feet with 2 turns activity did not occur: Safety/medical concerns ? ?Assist level: Minimal Assistance - Patient > 75% ?Assistive device: Walker-rolling  ? ? ?Walk 150 feet activity ? ? ?Assist Walk 150 feet activity did not occur: Safety/medical concerns ? ?  ?  ?  ? ?Walk 10 feet on uneven surface  ?activity ? ? ?Assist Walk 10 feet on uneven surfaces activity did not occur: Safety/medical concerns ? ? ?  ?   ? ?Wheelchair ? ? ? ? ?Assist Is the patient using a wheelchair?: Yes ?Type of Wheelchair: Manual ?  ? ?Wheelchair assist level: Minimal  Assistance - Patient > 75% ?Max wheelchair distance: 150  ? ? ?Wheelchair 50 feet with 2 turns activity ? ? ? ?Assist ? ?  ?  ? ? ?Assist Level: Minimal Assistance - Patient > 75%  ? ?Wheelchair 150 feet activity  ? ? ? ?Assist ?   ? ? ?Assist Level: Minimal Assistance - Patient > 75%  ? ? ?Medical Problem List and Plan: ?1. Functional deficits secondary to peripheral arterial disease, RIght 1st ray, 2,3,4 toe amps due to osteomyelitis with pop art stenting Right side surgery and acute pulmonary embolus. ?Continue CIR PT, OT - ELOS 5/3 ? ?2.  Antithrombotics: ?-DVT/anticoagulation:  Pharmaceutical: had PE now on Eliquis- no sign of surgical site bleeding  ?            -antiplatelet therapy: Aspirin 81 mg daily ?3. Pain Management: Tylenol, tramadol as needed ? Monitor with increased exertion ?4. Onchomycosis of left toes: antifungal cream ordered BID.  ?5. Neuropsych: This patient is capable of making decisions on his own behalf. ?6. Right foot incision: ?            --Daily dry dressing change ?7. Fluids/Electrolytes/Nutrition: Routine I's and O's ?            -- Carb modified diet ?8: PAD: Status post Right popliteal stent placement ?            -- Continue aspirin while on Eliquis.  Stop Plavix. ?            -- Follow-up with Dr. Lucky Cowboy with ABIs ?            -- Status post right partial first ray amputation  ?            -- Right heel weightbearing with postop shoe- trial of darco sandal PT evaluating use  ?No swelling no  ?9: prior CVA: ?10: Dyslipidemia: Continue Lipitor 40 mg daily ?11: Acute PE: Continue Eliquis ?12: Hypertension: Continue Norvasc 10 mg daily ?Vitals:  ? 05/22/21 2020 05/23/21 0434  ?BP: 131/77 128/79  ?Pulse: 94 92  ?Resp: 18 18  ?Temp: 99.1 ?F (37.3 ?C) 98.6 ?F (37 ?C)  ?SpO2: 100% 97%  ? Controlled 4/29 but orthostatic will check BMET, order ACE wraps  ? ?13: DM type II with peripheral neuropathy  Increase metformin to 500mg  BID  ? CBG (last 3) will order , pt states he has no CBG monitor at  home  ?Recent Labs  ?  05/22/21 ?1645 05/22/21 ?2022 05/23/21 ?0559  ?GLUCAP 213* 233* 201*  ? ? ?14: Seizure disorder maintained on Keppra: Continue 500 mg daily, ? If PCP managed this in past  ?15: Constipation: give sorbitol times once dose now ?            --schedule senna BID, LBM 4/24, give sorbitol ?16: Urinary retention:  d/c foley ?            -- Continue Flomax ? UA positive: continue ceftriaxone. ?17: Chronic diastolic congestive heart failure, euvolemic, no diuretics ?            -- Grade 1 diastolic dysfunction on recent echo ?18.  Hyponatremia ? Na improved to 134, monitor weekly ?19.  Bradykinesia , masked facies, min cogwheeling, no hx PD , no showed for appt with  Dr Jaynee Eagles- would rec f/u post hospitalization   ?Hx of remote RIght subcortical infarcts which may cause a parkinson like syndrome  , seizure d/o ? ?LOS: 17 days ?A FACE TO FACE EVALUATION WAS PERFORMED ? ?[name] ?05/23/2021, 10:48 AM ? ?

## 2021-05-23 NOTE — Progress Notes (Signed)
Speech Language Pathology Daily Session Note ? ?Patient Details  ?Name: [name] ?MRN: [id number] ?Date of Birth: 1960/08/26 ? ?Today's Date: 05/23/2021 ?SLP Individual Time: [phone] ?SLP Individual Time Calculation (min): 43 min ? ?Short Term Goals: ?Week 2: SLP Short Term Goal 1 (Week 2): STG=LTG due to ELOS ? ?Skilled Therapeutic Interventions: ?  Pt was seen for skilled ST services targeting cognition this session.  SLP facilitated session by providing supA verbal cueing to orient to date using calendar. Pt was able to relay to therapist that it was no longer April; however, was not able to relay correct date. SLP provided pt with May calendar and he was able to provide correct DOW after date was given. Pt was able to self-correct given supA cueing for DOB -- pt provided the wrong date (1st vs. 21st) during discussion. SLP facilitated increased awareness by discussing cognitive impairments with pt. Pt endorsed his memory and "thinking speed" has changed over the past 4-5 years and that he was needing him from his brother.  ? ?Pt wanted to transfer to recliner during today's session. SLP attempted to use RW with patient. Pt was unable to hold himself at midline and was leaning to right. At this time, SLP deemed it not safe to continue lift independently. NT was available to assist and STEDY was used to transfer patient. SLP contacted PT to let them know of decline.  ? ?Pain ?Pain Assessment ?Pain Score: 0-No pain ? ?Therapy/Group: Individual Therapy ? ?[place] ?05/23/2021, 3:54 PM ?

## 2021-05-23 NOTE — Progress Notes (Signed)
Physical Therapy Session Note ? ?Patient Details  ?Name: [name] ?MRN: [id number] ?Date of Birth: [birth year] ? ?Today's Date: 05/23/2021 ?PT Individual Time: 1053-1140 ?PT Individual Time Calculation (min): 47 min  ? ?Short Term Goals: ?Week 2:  PT Short Term Goal 1 (Week 2): Pt will ambulate 159f with CGA and LRAD ?PT Short Term Goal 1 - Progress (Week 2): Met ?PT Short Term Goal 2 (Week 2): pt will ascend  8 steps with min assist consistently ?PT Short Term Goal 2 - Progress (Week 2): Progressing toward goal ?PT Short Term Goal 3 (Week 2): [facility] ?PT Short Term Goal 3 - Progress (Week 2): Met ?PT Short Term Goal 4 (Week 2): Pt will propell WC 1054fwith supervision assist ?PT Short Term Goal 4 - Progress (Week 2): Met ?Week 3:  PT Short Term Goal 1 (Week 3): STG=LTG due to ELOS ? ?Skilled Therapeutic Interventions/Progress Updates:  ?Patient seated reclined back in recliner with BLE elevated on entrance to room. Patient alert and agreeable to PT session but relates that he needs to use the bathroom. RN relates that pt's UA has tested (+) for UTI and is now on IV antibiotics. This may be indicative of reason for pt's recent significant decline.  ? ?Patient with no pain complaint throughout session but does relate that it feels like he's "getting a sore" on his bottom. ROHO cushion with sacral cutout retrieved and readied for positioning in recliner.  ? ?Therapeutic Activity: ?Transfers: Pt relates that he needs to go to the bathroom. He requests his RW so he can walk to bathroom. Pt reminded that he has not been able to stand recently without significant assistance and has not walked in a few days d/t his decrease in activity tolerance, strength and balance d/t UTI.  Pt looks confused. Related need for use of STEDY. Patient performed sit<>stand transfers throughout session with Max A +1. Pt requires several stands to/ from seated perch in order to manage clothing  with TotA. Pericare performed TotA.  No elimination noted in toilet but smears noted on washcloth when providing posterior pericare. Provided verbal cues throughout for hand placement, level gaze, Bil knee/ hip extension in stance, and holding upper body upright in stance.  ? ?Neuromuscular Reeducation: ?Pt guided in practice of forward lean to bring weight over feet for improved transfers d/t recent decline. Is able to initiate rise to stand [facility] MaxA to reach more upright positioning as pt maintains crouched position.  ? ?STS to RW with MaxA and heavy multimodal cues for split hand positioning, but pt unable to reach upright position in extension. Flexed at hips, knees and spine despite continuous vc/ tc and pt responding verbally to cues. Requires MaxA to get into more upright position but slowly flexes forward with release of pressure to hips and chest/ shoulder.  ? ?Patient seated  in recliner at end of session with brakes locked, belt alarm set, and all needs within reach. Pt fatigued and unable to continue. Relates improved comfort with cushion placement.  ? ? ?Therapy Documentation ?Precautions:  ?Precautions ?Precautions: Fall ?Precaution Comments: baseline R HP and cognitive deficits ?Restrictions ?Weight Bearing Restrictions: Yes ?RLE Weight Bearing: Partial weight bearing ?Other Position/Activity Restrictions: heel WB with surgical shoe ?General: ?PT Amount of Missed Time (min): 28 Minutes ?PT Missed Treatment Reason: Patient ill (Comment);Patient fatigue ?Vital Signs: ?  ?Pain: ?Pain Assessment ?Pain Scale: 0-10 ?Pain Score: 0-No  pain ? ?Therapy/Group: Individual Therapy ? ?Alger Simons PT, DPT ?05/23/2021, 12:14 PM  ?

## 2021-05-24 ENCOUNTER — Inpatient Hospital Stay (HOSPITAL_COMMUNITY): Payer: Medicare Other

## 2021-05-24 LAB — CBC WITH DIFFERENTIAL/PLATELET
Abs Immature Granulocytes: 0.02 10*3/uL (ref 0.00–0.07)
Abs Immature Granulocytes: 0.03 10*3/uL (ref 0.00–0.07)
Basophils Absolute: 0 10*3/uL (ref 0.0–0.1)
Basophils Absolute: 0 10*3/uL (ref 0.0–0.1)
Basophils Relative: 0 %
Basophils Relative: 1 %
Eosinophils Absolute: 0 10*3/uL (ref 0.0–0.5)
Eosinophils Absolute: 0 10*3/uL (ref 0.0–0.5)
Eosinophils Relative: 0 %
Eosinophils Relative: 0 %
HCT: 35.2 % — ABNORMAL LOW (ref 39.0–52.0)
HCT: 36.1 % — ABNORMAL LOW (ref 39.0–52.0)
Hemoglobin: 11.6 g/dL — ABNORMAL LOW (ref 13.0–17.0)
Hemoglobin: 12.3 g/dL — ABNORMAL LOW (ref 13.0–17.0)
Immature Granulocytes: 0 %
Immature Granulocytes: 1 %
Lymphocytes Relative: 11 %
Lymphocytes Relative: 18 %
Lymphs Abs: 0.6 10*3/uL — ABNORMAL LOW (ref 0.7–4.0)
Lymphs Abs: 0.8 10*3/uL (ref 0.7–4.0)
MCH: 29 pg (ref 26.0–34.0)
MCH: 29.8 pg (ref 26.0–34.0)
MCHC: 33 g/dL (ref 30.0–36.0)
MCHC: 34.1 g/dL (ref 30.0–36.0)
MCV: 88 fL (ref 80.0–100.0)
MCV: 87.4 fL (ref 80.0–100.0)
Monocytes Absolute: 0.3 10*3/uL (ref 0.1–1.0)
Monocytes Absolute: 0.4 10*3/uL (ref 0.1–1.0)
Monocytes Relative: 6 %
Monocytes Relative: 8 %
Neutro Abs: 4.8 10*3/uL (ref 1.7–7.7)
Neutro Abs: 3.4 10*3/uL (ref 1.7–7.7)
Neutrophils Relative %: 83 %
Neutrophils Relative %: 72 %
Platelets: 193 10*3/uL (ref 150–400)
Platelets: 191 10*3/uL (ref 150–400)
RBC: 4 MIL/uL — ABNORMAL LOW (ref 4.22–5.81)
RBC: 4.13 MIL/uL — ABNORMAL LOW (ref 4.22–5.81)
RDW: 13.2 % (ref 11.5–15.5)
RDW: 13.2 % (ref 11.5–15.5)
WBC: 5.7 10*3/uL (ref 4.0–10.5)
WBC: 4.7 10*3/uL (ref 4.0–10.5)
nRBC: 0 % (ref 0.0–0.2)
nRBC: 0 % (ref 0.0–0.2)

## 2021-05-24 LAB — GLUCOSE, CAPILLARY
Glucose-Capillary: 137 mg/dL — ABNORMAL HIGH (ref 70–99)
Glucose-Capillary: 167 mg/dL — ABNORMAL HIGH (ref 70–99)
Glucose-Capillary: 173 mg/dL — ABNORMAL HIGH (ref 70–99)
Glucose-Capillary: 210 mg/dL — ABNORMAL HIGH (ref 70–99)

## 2021-05-24 LAB — BASIC METABOLIC PANEL
Anion gap: 11 (ref 5–15)
BUN: 31 mg/dL — ABNORMAL HIGH (ref 6–20)
CO2: 22 mmol/L (ref 22–32)
Calcium: 8.7 mg/dL — ABNORMAL LOW (ref 8.9–10.3)
Chloride: 100 mmol/L (ref 98–111)
Creatinine, Ser: 0.96 mg/dL (ref 0.61–1.24)
GFR, Estimated: >60 mL/min (ref >60)
Glucose, Bld: 218 mg/dL — ABNORMAL HIGH (ref 70–99)
Potassium: 3.6 mmol/L (ref 3.5–5.1)
Sodium: 133 mmol/L — ABNORMAL LOW (ref 135–145)

## 2021-05-24 MED ORDER — VANCOMYCIN HCL 1500 MG/300ML IV SOLN
1500.0000 mg | INTRAVENOUS | Status: DC
  Administered 2021-05-25: 1500 mg via INTRAVENOUS
  Filled 2021-05-24 (×2): qty 300

## 2021-05-24 MED ORDER — SODIUM CHLORIDE 0.9 % IV SOLN: INTRAVENOUS | Status: DC

## 2021-05-24 MED ORDER — POTASSIUM CHLORIDE IN NACL 20-0.9 MEQ/L-% IV SOLN
INTRAVENOUS | Status: DC
  Filled 2021-05-24: qty 1000

## 2021-05-24 MED ORDER — VANCOMYCIN HCL IN DEXTROSE 1-5 GM/200ML-% IV SOLN
1000.0000 mg | Freq: Once | INTRAVENOUS | Status: AC
  Administered 2021-05-24: 1000 mg via INTRAVENOUS
  Filled 2021-05-24: qty 200

## 2021-05-24 NOTE — Progress Notes (Signed)
Occupational Therapy Session Note ? ?Patient Details  ?Name: [name] ?MRN: [id number] ?Date of Birth: 29-Aug-1960 ? ?Today's Date: 05/24/2021 ?OT Individual Time: 5366-4403 ?OT Individual Time Calculation (min): 42 min + 60 missed min due to fatigue/lethargy in setting of UTI+ ? ? ?Short Term Goals: ?Week 1:  OT Short Term Goal 1 (Week 1): pt will complete >2 standing grooming task with CGA ?OT Short Term Goal 1 - Progress (Week 1): Met ?OT Short Term Goal 2 (Week 1): Pt will complete ambulatory toilet transfer with min A and LRAD ?OT Short Term Goal 2 - Progress (Week 1): Not met ?OT Short Term Goal 3 (Week 1): Pt will don pants with mod A ?OT Short Term Goal 3 - Progress (Week 1): Not met ?OT Short Term Goal 4 (Week 1): Pt will assist with donning surgical shoe wwith no more than mod A. ?OT Short Term Goal 4 - Progress (Week 1): Not met ?OT Short Term Goal 5 (Week 1): Pt will complete ambulatory toilet transfer with min A and LRAD ?Week 2:  OT Short Term Goal 1 (Week 2): Pt will assist with donning surgical shoe wwith no more than mod A. ?OT Short Term Goal 1 - Progress (Week 2): Met ?OT Short Term Goal 2 (Week 2): Pt will don pants with mod A ?OT Short Term Goal 2 - Progress (Week 2): Met ?OT Short Term Goal 3 (Week 2): Pt will don shirt with S. ?OT Short Term Goal 3 - Progress (Week 2): Met ?Week 3:  OT Short Term Goal 1 (Week 3): STG = LTG 2/2 ELOS ? ?Skilled Therapeutic Interventions/Progress Updates:  ?  Session 1 [phone]): Pt received seated up in bed with NT present starting breakfast, agreeable to therapy. Session focus on self-care retraining, activity tolerance, transfer retraining in prep for improved ADL/IADL/func mobility performance + decreased caregiver burden. Denies pain and reports feeling stronger than in previous days and agreeable to attempt to exit bed to transfer to recliner to finish breakfast. Max A of 1 to come sitting EOB with heavy use of bed features. Consistent min to mod A for  static sitting balance due to anterior/L lean. Did tranfser in stedy with heavy max A of 2, very flexed posture and pt unable to remove forehead from bar. Determined pt would not be safe to transfer at this time/pt at risk of being unable to later transfer from recliner. Max A of 2 to reposition back in bed. LPN present to administer morning meds. ? ?Max to total A this date for self-feeding due to generalized weakness and cogwheeling at R elbows. Did demo some improvement in ability to self-feed with built up handle. ? ?Pt left seated up in bed with bed alarm engaged, call bell in reach, and all immediate needs met.  ? ?Session 2  Attempt: Attempted to see pt for scheduled OT session. Pt received semi-reclined , asleep in bed. Flutters eye to voice, offered repositioning and bed level activity, but pt requesting to sleep. Delayed responses and unable to maintain eyes open. Pt left semi-reclined in bed with bed alarm engaged, call bell in reach and all immediate needs met. Pt missed 60 min of skilled OT due to fatigue/weakness in settin gof UTI. ? ? ?Therapy Documentation ?Precautions:  ?Precautions ?Precautions: Fall ?Precaution Comments: baseline R HP and cognitive deficits ?Restrictions ?Weight Bearing Restrictions: Yes ?RLE Weight Bearing: Partial weight bearing ?Other Position/Activity Restrictions: heel WB with surgical shoe ? ?Pain: denies ?  ?ADL: See Care Tool  for more details. ? ? ?Therapy/Group: Individual Therapy ? ?Volanda Napoleon MS, OTR/L ? ?05/24/2021, 6:53 AM ?

## 2021-05-24 NOTE — Progress Notes (Signed)
Pts sutures removed. Pt tolerated well. 11 sutures removed. Incision clean, dry, and intact.  ?

## 2021-05-24 NOTE — Progress Notes (Signed)
Patient bladder scanned for 738, patient voided and PVR 230. Patient in and out cathed for urine culture. Cathed for 300 ?

## 2021-05-24 NOTE — Progress Notes (Signed)
Discussed patient with Min[facility]-on call pharmacist. Still awaiting labwork. UCS from 04/30 pending. With normal WBC question simmering foot infection. We reviewed pathology report--clean margins noted. Vitals relatively stable and patient reported to be at baseline without fever, chills or malaise. He recommended monitoring pending labs--to add Vanc in am if still febrile.  ?
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
## 2021-05-24 NOTE — Progress Notes (Signed)
Patient ID: Drayson Dorko, male   DOB: 12-Oct-1960, 61 y.o.   MRN: [id number] ? ?Rolling walker ordered through [employer].  ?

## 2021-05-24 NOTE — Progress Notes (Signed)
Patient ID: [name], male   DOB: [birth year], 61 y.o.   MRN: [id number] ? ?Patient urology appointment rescheduled to 5/10 at 8:15 AM ?

## 2021-05-24 NOTE — Progress Notes (Shared)
Inpatient Rehabilitation Care Coordinator ?Discharge Note  ? ?Patient Details  ?Name: [name] ?MRN: [id number] ?Date of Birth: [birth year] ? ? ?Discharge location: Home ? ?Length of Stay: 21 Days ? ?Discharge activity level: Sup ? ?Home/community participation: Brother ? ?Patient response DX:AJOINO Literacy - How often do you need to have someone help you when you read instructions, pamphlets, or other written material from your doctor or pharmacy?: Never ? ?Patient response MV:EHMCNO Isolation - How often do you feel lonely or isolated from those around you?: Never ? ?Services provided included: SW, Neuropsych, Pharmacy, TR, CM, RN, SLP, OT, PT, RD, MD ? ?Financial Services:  ?Charity fundraiser Utilized: Medicare ?  ? ?Choices offered to/list presented to: patient/brother and sister ? ?Follow-up services arranged:  ?Home Health ?[facility] Agency: [facility]  ?  ?  ?  ? ?Patient response to transportation need: ?Is the patient able to respond to transportation needs?: Yes ?In the past 12 months, has lack of transportation kept you from medical appointments or from getting medications?: No ?In the past 12 months, has lack of transportation kept you from meetings, work, or from getting things needed for daily living?: No ? ? ? ?Comments (or additional information): ? ?Patient/Family verbalized understanding of follow-up arrangements:  Yes ? ?Individual responsible for coordination of the follow-up plan: [name] [phone] ? ?Confirmed correct DME delivered: Dyanne Iha 05/24/2021   ? ?Dyanne Iha ?

## 2021-05-24 NOTE — Plan of Care (Signed)
?  Problem: RH Swallowing ?Goal: LTG Patient will consume least restrictive diet using compensatory strategies with assistance (SLP) ?Description: LTG:  Patient will consume least restrictive diet using compensatory strategies with assistance (SLP) ?Flowsheets (Taken 05/24/2021 1523) ?LTG: Pt Patient will consume least restrictive diet using compensatory strategies with assistance of (SLP): Supervision ?  ?

## 2021-05-24 NOTE — Progress Notes (Signed)
Pt returned from xray

## 2021-05-24 NOTE — Progress Notes (Signed)
?   05/24/21 1950  ?Assess: MEWS Score  ?Temp (!) 102.9 ?F (39.4 ?C)  ?BP (!) 156/80  ?Pulse Rate 99  ?Resp 16  ?SpO2 99 %  ?O2 Device Room Air  ?Assess: MEWS Score  ?MEWS Temp 2  ?MEWS Systolic 0  ?MEWS Pulse 0  ?MEWS RR 0  ?MEWS LOC 0  ?MEWS Score 2  ?MEWS Score Color Yellow  ?Assess: if the MEWS score is Yellow or Red  ?Were vital signs taken at a resting state? Yes  ?Focused Assessment No change from prior assessment  ?Early Detection of Sepsis Score *See Row Information* High  ?MEWS guidelines implemented *See Row Information* Yes  ?Treat  ?MEWS Interventions Administered prn meds/treatments;Escalated (See documentation below)  ?Pain Scale 0-10  ?Pain Score 0  ?Take Vital Signs  ?Increase Vital Sign Frequency  Yellow: Q 2hr X 2 then Q 4hr X 2, if remains yellow, continue Q 4hrs  ?Escalate  ?MEWS: Escalate Yellow: discuss with charge nurse/RN and consider discussing with provider and RRT  ?Notify: Charge Nurse/RN  ?Name of Charge Nurse/RN Notified Katharine Look  ?Date Charge Nurse/RN Notified 05/24/21  ?Time Charge Nurse/RN Notified 2015  ?Notify: Provider  ?Provider Name/Title Algis Liming, PA  ?Date Provider Notified 05/24/21  ?Time Provider Notified 2010  ?Notification Type Call  ?Notification Reason Change in status  ?Provider response Evaluate remotely;See new orders  ?Date of Provider Response 05/24/21  ?Time of Provider Response 2010  ? ?Patient temp 102.9, yellow mews, Pam PA notified orders place. ?

## 2021-05-24 NOTE — Progress Notes (Signed)
Pt transported to xray by transport services  ?

## 2021-05-24 NOTE — Progress Notes (Signed)
Pharmacy Antibiotic Note ? ?[name] is a 61 y.o. male admitted on 05/06/2021 with  empiric for wound/fever .  Pharmacy has been consulted for vanc dosing. ? ?Pt recently had toe amputation at [facility] for osteomyelitis and supposedly with clean margins. He has been on ceftriaxone here for UTI. Cultures not back yet after talking with micro. He spike a fever tonight. D/w Algis Liming, we will get labs, cultures, and add empiric vanc to ceftriaxone for now. ? ? ?Plan: ?Vanc 1.5g IV q24>>AUC 430, scr 0.98 ?Level as needed ?Bmet in AM ? ?Height: 5\' 7"  (170.2 cm) ?Weight: 71.2 kg (156 lb 15.5 oz) ?IBW/kg (Calculated) : 66.1 ? ?Temp (24hrs), Avg:101.2 ?F (38.4 ?C), Min:97.8 ?F (36.6 ?C), Max:103 ?F (39.4 ?C) ? ?Recent Labs  ?Lab 05/21/21 ?0921 05/23/21 ?0650 05/24/21 ?4320  ?WBC  --   --  5.7  ?CREATININE 1.00 0.98  --   ?  ?Estimated Creatinine Clearance: 76.8 mL/min (by C-G formula based on SCr of 0.98 mg/dL).   ? ?No Known Allergies ? ?Antimicrobials this admission: ?Unasyn 4/11 >> 4/14 ?Augmentin 4/14 >> 4/21 (for 21 doses) ?4/30 ceftriaxone>> 4/4 ?Cephalexin x 1 on 4/29 ?CTX 4/30 >> (5/4) ?Vanc 5/2>> ? ?Dose adjustments this admission: ? ? ?Microbiology results: ?4/30 urine>> ?5/2 blood>> ? ?Onnie Boer, PharmD, BCIDP, AAHIVP, CPP ?Infectious Disease Pharmacist ?05/24/2021 9:40 PM ? ? ? ?

## 2021-05-24 NOTE — Progress Notes (Signed)
Patient ID: [name], male   DOB: 03-23-1960, 61 y.o.   MRN: [id number] ? ?Patient sister plans to bring a copy of patient Medicare card to the facility in the morning. This has been requested due to incorrect DOB with Medicare. [employer] unable to verify Medicare benefits.  ?

## 2021-05-24 NOTE — Progress Notes (Signed)
Physical Therapy Session Note ? ?Patient Details  ?Name: [name] ?MRN: [id number] ?Date of Birth: [birth year] ? ?Today's Date: 05/24/2021 ?PT Individual Time: 0851-1000 ?PT Individual Time Calculation (min): 69 min  ? ?Short Term Goals: ?Week 2:  PT Short Term Goal 1 (Week 2): Pt will ambulate 158f with CGA and LRAD ?PT Short Term Goal 1 - Progress (Week 2): Met ?PT Short Term Goal 2 (Week 2): pt will ascend  8 steps with min assist consistently ?PT Short Term Goal 2 - Progress (Week 2): Progressing toward goal ?PT Short Term Goal 3 (Week 2): Pt will transfer to and [facility] consistently ?PT Short Term Goal 3 - Progress (Week 2): Met ?PT Short Term Goal 4 (Week 2): Pt will propell WC 1033fwith supervision assist ?PT Short Term Goal 4 - Progress (Week 2): Met ?Week 3:  PT Short Term Goal 1 (Week 3): STG=LTG due to ELOS ? ? ?Skilled Therapeutic Interventions/Progress Updates:  ?Patient supine in bed on entrance to room. Patient alert and agreeable to PT session. Breakfast in front of pt with minimal amount eaten. Pt encouraged to eat in order to assist with healing and to provide energy for therapy sessions. Max encouragement with pt refusing and stating he is not hungry.  ? ?Patient with no pain complaint throughout session. ? ?Therapeutic Activity: ?Bed Mobility/NMR: Patient performed supine <> sit with MaxA and max cues to complete. Upon reaching upright seated position, pt is unable to maintain upright seated position. VC/ tc required throughout for attempt to maintain upright posture. Severe forward lean, lateral leans to each side. Requires MaxA to keep from total LOB.  ?NMR performed for improvements in motor control and coordination, balance, sequencing, judgement, and self confidence/ efficacy in performing all aspects of mobility at highest level of independence.  ? ?Transfers: Patient performed squat pivot transfers throughout session bed--> w/c to L, then w/c--> recliner to R side and unable to  sit upright with bias to R, then recliner-->bed with Max/ TotA.  Positioned with minimal bed flexion at knees and hips in order promote BLE extension d/t increased tone into flexion with attempt to extend LEs. Provided max cues for technique and effort throughout session. ? ?Patient supine  in bed at end of session with brakes locked, bed alarm set, and all needs within reach. ? ? ?Therapy Documentation ?Precautions:  ?Precautions ?Precautions: Fall ?Precaution Comments: baseline R HP and cognitive deficits ?Restrictions ?Weight Bearing Restrictions: Yes ?RLE Weight Bearing: Partial weight bearing ?Other Position/Activity Restrictions: heel WB with surgical shoe ?General: ?  ?Vital Signs: ?  ?Pain: ?Pain Assessment ?Pain Scale: 0-10 ?Pain Score: 0-No pain ? ?Therapy/Group: Individual Therapy ? ?JuAlger SimonsT, DPT ?05/24/2021, 10:23 AM  ?

## 2021-05-24 NOTE — Progress Notes (Signed)
[place] PHYSICAL MEDICINE & REHABILITATION PROGRESS NOTE ? ?Subjective/Complaints: ? ?Awake eating breakfast with CNA assist  ?Per OT, now req 2 person assist ?Was min A last week  ?Awaiting Ucx result  ? ?ROS: Denies CP, SOB, N/V/D ? ? ?Objective: ?Vital Signs: ?Blood pressure 126/76, pulse 90, temperature 97.8 ?F (36.6 ?C), resp. rate 16, height 5\' 7"  (1.702 m), weight 71.2 kg, SpO2 100 %. ?No results found. ?No results for input(s): WBC, HGB, HCT, PLT in the last 72 hours. ? ?Recent Labs  ?  05/21/21 ?0921 05/23/21 ?0650  ?NA 129* 134*  ?K 3.7 3.5  ?CL 97* 102  ?CO2 22 25  ?GLUCOSE 197* 176*  ?BUN 22* 24*  ?CREATININE 1.00 0.98  ?CALCIUM 8.8* 8.9  ? ? ? ?Intake/Output Summary (Last 24 hours) at 05/24/2021 0804 ?Last data filed at 05/23/2021 2356 ?Gross per 24 hour  ?Intake 240 ml  ?Output 351 ml  ?Net -111 ml  ? ?  ? ?  ? ?Physical Exam: ?BP 126/76 (BP Location: Left Arm)   Pulse 90   Temp 97.8 ?F (36.6 ?C)   Resp 16   Ht 5\' 7"  (1.702 m)   Wt 71.2 kg   SpO2 100%   BMI 24.58 kg/m?  ?Gen: no distress, normal appearing ?HEENT: oral mucosa pink and moist, NCAT ?Cardio: Reg rate ?Chest: normal effort, normal rate of breathing ?Abd: soft, non-distended ?Ext: no edema ?Psych: pleasant, normal affect ? ? ?Skin: Warm and dry.  Intact.RIgh t1st ray amp site CDI no erythema ?4/24 ? ? ? ? ?Psych: Normal behavior.  Normal affect. ?Musc:    Right amp incision with dressing   ?Neuro: 4-/5 throughout,  ? ?Hand intrinsic atrophy with reduced sensation in 5th fingers bilateral , also reduced sensation feet to LT , poor concentration /distractibility limits testing  ? ?Assessment/Plan: ?1. Functional deficits which require 3+ hours per day of interdisciplinary therapy in a comprehensive inpatient rehab setting. ?Physiatrist is providing close team supervision and 24 hour management of active medical problems listed below. ?Physiatrist and rehab team continue to assess barriers to discharge/monitor patient progress toward  functional and medical goals ? ? ?Care Tool: ? ?Bathing ?   ?Body parts bathed by patient: Right arm, Left arm, Chest, Abdomen, Front perineal area, Right upper leg, Left upper leg, Face  ? Body parts bathed by helper: Right lower leg, Left lower leg, Buttocks ?  ?  ?Bathing assist Assist Level: Moderate Assistance - Patient 50 - 74% (standing for washing buttocks) ?  ?  ?Upper Body Dressing/Undressing ?Upper body dressing   ?What is the patient wearing?: Pull over shirt ?   ?Upper body assist Assist Level: Supervision/Verbal cueing ?   ?Lower Body Dressing/Undressing ?Lower body dressing ? ? ?   ?What is the patient wearing?: Pants ? ?  ? ?Lower body assist Assist for lower body dressing: Minimal Assistance - Patient > 75% (reacher) ?   ? ?Toileting ?Toileting Toileting Activity did not occur (Probation officer and hygiene only): N/A (no void or bm)  ?Toileting assist Assist for toileting: Maximal Assistance - Patient 25 - 49% ?  ?  ?Transfers ?Chair/bed transfer ? ?Transfers assist ?   ? ?Chair/bed transfer assist level: Contact Guard/Touching assist ?  ?  ?Locomotion ?Ambulation ? ? ?Ambulation assist ? ?   ? ?Assist level: Minimal Assistance - Patient > 75% ?Assistive device: Walker-rolling ?Max distance: 120 ft  ? ?Walk 10 feet activity ? ? ?Assist ?   ? ?Assist level: Minimal  Assistance - Patient > 75% ?Assistive device: Walker-rolling  ? ?Walk 50 feet activity ? ? ?Assist Walk 50 feet with 2 turns activity did not occur: Safety/medical concerns ? ?Assist level: Minimal Assistance - Patient > 75% ?Assistive device: Walker-rolling  ? ? ?Walk 150 feet activity ? ? ?Assist Walk 150 feet activity did not occur: Safety/medical concerns ? ?  ?  ?  ? ?Walk 10 feet on uneven surface  ?activity ? ? ?Assist Walk 10 feet on uneven surfaces activity did not occur: Safety/medical concerns ? ? ?  ?   ? ?Wheelchair ? ? ? ? ?Assist Is the patient using a wheelchair?: Yes ?Type of Wheelchair: Manual ?  ? ?Wheelchair assist  level: Minimal Assistance - Patient > 75% ?Max wheelchair distance: 150  ? ? ?Wheelchair 50 feet with 2 turns activity ? ? ? ?Assist ? ?  ?  ? ? ?Assist Level: Minimal Assistance - Patient > 75%  ? ?Wheelchair 150 feet activity  ? ? ? ?Assist ?   ? ? ?Assist Level: Minimal Assistance - Patient > 75%  ? ? ?Medical Problem List and Plan: ?1. Functional deficits secondary to peripheral arterial disease, RIght 1st ray, 2,3,4 toe amps due to osteomyelitis with pop art stenting Right side surgery and acute pulmonary embolus. ?Continue CIR PT, OT - ELOS 5/3- will need to extend to 5/6 due to UTI with decline in fxn  ? ?2.  Antithrombotics: ?-DVT/anticoagulation:  Pharmaceutical: had PE now on Eliquis- no sign of surgical site bleeding  ?            -antiplatelet therapy: Aspirin 81 mg daily ?3. Pain Management: Tylenol, tramadol as needed ? Monitor with increased exertion ?4. Onchomycosis of left toes: antifungal cream ordered BID.  ?5. Neuropsych: This patient is capable of making decisions on his own behalf. ?6. Right foot incision: ?            --Daily dry dressing change ?7. Fluids/Electrolytes/Nutrition: Routine I's and O's ?            -- Carb modified diet ?8: PAD: Status post Right popliteal stent placement ?            -- Continue aspirin while on Eliquis.  Stop Plavix. ?            -- Follow-up with Dr. Lucky Cowboy with ABIs ?            -- Status post right partial first ray amputation  ?            -- Right heel weightbearing with postop shoe- trial of darco sandal PT evaluating use  ?No swelling no  ?9: prior CVA: ?10: Dyslipidemia: Continue Lipitor 40 mg daily ?11: Acute PE: Continue Eliquis ?12: Hypertension: Continue Norvasc 10 mg daily ?Vitals:  ? 05/23/21 1931 05/24/21 0450  ?BP: (!) 143/78 126/76  ?Pulse: 95 90  ?Resp: 16 16  ?Temp: 99.7 ?F (37.6 ?C) 97.8 ?F (36.6 ?C)  ?SpO2: 100% 100%  ? Controlled 5/2 but orthostatic will check BMET, order ACE wraps  ? ?13: DM type II with peripheral neuropathy  Increase  metformin to 500mg  BID  ? CBG (last 3) will order , pt states he has no CBG monitor at home  ?Recent Labs  ?  05/23/21 ?1704 05/23/21 ?2057 05/24/21 ?6568  ?GLUCAP 172* 185* 137*  ? ?CBG improving  ? ?14: Seizure disorder maintained on Keppra: Continue 500 mg daily, ? If PCP managed this in past  ?15:  Constipation: give sorbitol times once dose now ?            --schedule senna BID, LBM 4/24, give sorbitol ?16: Urinary retention: d/c foley ?            UTI on ceftriaxone, afeb but still with reduced mobility vs last week , await  ?17: Chronic diastolic congestive heart failure, euvolemic, no diuretics ?            -- Grade 1 diastolic dysfunction on recent echo ?18.  Hyponatremia ? Na improved to 134, monitor weekly ?19.  Bradykinesia , masked facies, min cogwheeling, no hx PD , no showed for appt with  Dr Jaynee Eagles- would rec f/u post hospitalization   ?Hx of remote RIght subcortical infarcts which may cause a parkinson like syndrome  , seizure d/o ?20.  Poor intake with UTI, check BMET may need IVF ?LOS: 18 days ?A FACE TO FACE EVALUATION WAS PERFORMED ? ?[name] ?05/24/2021, 8:04 AM ? ?

## 2021-05-25 ENCOUNTER — Ambulatory Visit: Payer: Self-pay | Admitting: Urology

## 2021-05-25 ENCOUNTER — Inpatient Hospital Stay (HOSPITAL_COMMUNITY): Payer: Medicare Other

## 2021-05-25 DIAGNOSIS — N3000 Acute cystitis without hematuria: Secondary | ICD-10-CM

## 2021-05-25 LAB — URINE CULTURE: Culture: NO GROWTH

## 2021-05-25 LAB — CBC WITH DIFFERENTIAL/PLATELET
Abs Immature Granulocytes: 0.02 10*3/uL (ref 0.00–0.07)
Basophils Absolute: 0 10*3/uL (ref 0.0–0.1)
Basophils Relative: 0 %
Eosinophils Absolute: 0 10*3/uL (ref 0.0–0.5)
Eosinophils Relative: 1 %
HCT: 32.4 % — ABNORMAL LOW (ref 39.0–52.0)
Hemoglobin: 10.7 g/dL — ABNORMAL LOW (ref 13.0–17.0)
Immature Granulocytes: 0 %
Lymphocytes Relative: 24 %
Lymphs Abs: 1.1 10*3/uL (ref 0.7–4.0)
MCH: 29.3 pg (ref 26.0–34.0)
MCHC: 33 g/dL (ref 30.0–36.0)
MCV: 88.8 fL (ref 80.0–100.0)
Monocytes Absolute: 0.4 10*3/uL (ref 0.1–1.0)
Monocytes Relative: 8 %
Neutro Abs: 3 10*3/uL (ref 1.7–7.7)
Neutrophils Relative %: 67 %
Platelets: 178 10*3/uL (ref 150–400)
RBC: 3.65 MIL/uL — ABNORMAL LOW (ref 4.22–5.81)
RDW: 13.2 % (ref 11.5–15.5)
WBC: 4.6 10*3/uL (ref 4.0–10.5)
nRBC: 0 % (ref 0.0–0.2)

## 2021-05-25 LAB — LACTIC ACID, PLASMA
Lactic Acid, Venous: 2.1 mmol/L (ref 0.5–1.9)
Lactic Acid, Venous: 1.4 mmol/L (ref 0.5–1.9)

## 2021-05-25 LAB — GLUCOSE, CAPILLARY
Glucose-Capillary: 146 mg/dL — ABNORMAL HIGH (ref 70–99)
Glucose-Capillary: 171 mg/dL — ABNORMAL HIGH (ref 70–99)
Glucose-Capillary: 155 mg/dL — ABNORMAL HIGH (ref 70–99)
Glucose-Capillary: 219 mg/dL — ABNORMAL HIGH (ref 70–99)

## 2021-05-25 LAB — SEDIMENTATION RATE: Sed Rate: 74 mm/hr — ABNORMAL HIGH (ref 0–16)

## 2021-05-25 LAB — PROCALCITONIN: Procalcitonin: 0.27 ng/mL

## 2021-05-25 MED ORDER — SODIUM CHLORIDE 0.9 % IV SOLN: INTRAVENOUS | Status: DC

## 2021-05-25 MED ORDER — POTASSIUM CHLORIDE IN NACL 20-0.9 MEQ/L-% IV SOLN
INTRAVENOUS | Status: AC
  Filled 2021-05-25: qty 1000

## 2021-05-25 MED ORDER — SODIUM CHLORIDE 0.9 % IV BOLUS
1000.0000 mL | Freq: Once | INTRAVENOUS | Status: DC
Start: 2021-05-25 — End: 2021-05-25

## 2021-05-25 MED ORDER — POTASSIUM CHLORIDE IN NACL 20-0.9 MEQ/L-% IV SOLN
INTRAVENOUS | Status: DC
Start: 2021-05-25 — End: 2021-05-26
  Administered 2021-05-25: 950 mL via INTRAVENOUS
  Filled 2021-05-25 (×3): qty 1000

## 2021-05-25 NOTE — Progress Notes (Signed)
Patient ID: [name], male   DOB: 24-Jun-1960, 61 y.o.   MRN: [id number] ? ?Team Conference Report to Patient/Family ? ?Team Conference discussion was reviewed with the patient and caregiver, including goals, any changes in plan of care and target discharge date.  Patient and caregiver express understanding and are in agreement.  The patient has a target discharge date of 05/28/21. ? ?Sw spoke with patient sister and informed patient sister of the potential discharge date. The patient sister, [name] plans on bringing the Medicare card to the facility today to provide to [employer].  ? ?Dyanne Iha ?05/25/2021, 2:14 PM  ?

## 2021-05-25 NOTE — Patient Care Conference (Signed)
Inpatient RehabilitationTeam Conference and Plan of Care Update ?Date: 05/25/2021   Time: 10:10 AM  ? ? ?Patient Name: [name]      ?Medical Record Number: [id number]  ?Date of Birth: 1960/01/31 ?Sex: Male         ?Room/Bed: 4M10C/4M10C-01 ?Payor Info: Payor: MEDICARE / Plan: MEDICARE PART A AND B / Product Type: *No Product type* /   ? ?Admit Date/Time:  05/06/2021 11:44 AM ? ?Primary Diagnosis:  Pulmonary embolism ([place]) ? ?Hospital Problems: Principal Problem: ?  Pulmonary embolism ([place]) ?Active Problems: ?  Essential hypertension ?  Acute pulmonary embolism ([place]) ?  Controlled type 2 diabetes mellitus with hyperglycemia, without long-term current use of insulin ([place]) ? ? ? ?Expected Discharge Date: Expected Discharge Date: 05/28/21 ? ?Team Members Present: ?Physician leading conference: Dr. Alysia Penna ?Social Worker Present: Erlene Quan, BSW ?Nurse Present: Dorien Chihuahua, RN ?PT Present: Barrie Folk, PT ?OT Present: Providence Lanius, OT ?SLP Present: Sherren Kerns, SLP ?PPS Coordinator present : Gunnar Fusi, SLP ? ?   Current Status/Progress Goal Weekly Team Focus  ?Bowel/Bladder ? ? cont/ incont of B/B LBM5/1  regain cont  assess q shift and prn   ?Swallow/Nutrition/ Hydration ? ? Swallow assessed yesterday (5/2) d/t report of difficulty chewing and swallowing medications. Diet downgraded to Dys 2 with thin liquids; meds whole in puree.  sup A  tolerance of current diet; progress if able   ?ADL's ? ? Functoinal decline, unable to stand for a few days. Needed Max/total A to get to partial stand in Steady, max/total A for BADL tasks with poor sitting balance and posterior lean. Unable to sit EOB without mod A.  supervision bathing and toileting; min A LB dressing, may need to downgrade goals  BADL tasks, sit<>stands, transfers, general strengthening   ?Mobility ? ? Bed mobility = MaxA, Transfers = MaxA to TotA, Gait = unable - significant decline d/t UTI  SUP for bed mobility, SUP/ CGA for  transfers, SUP for ambulation, MinA for stairs (full flight at home)  NMR for balance, progressing all functional mobility, safety in transfers and gait, family education   ?Communication ? ?           ?Safety/Cognition/ Behavioral Observations ? sup-to-min A  sup-to-min A problem solving, sup A memory using external aids, sup A for intellectual awareness  basic problem solving, awareness, functional recall   ?Pain ? ? no complaints of pain  remain pain free  assess q shift and prn   ?Skin ? ? surgical incisions, otherwise skin intact  no new breakdown  assess q shift and prn   ? ? ?Discharge Planning:  ?Discharging home with brother with [facility] DME set   ?Team Discussion: ?Patient with decline in function with UTI; treated with abx and fluid bolus. Continue to note crouched posture but doing better post treatment. ? ?Patient on target to meet rehab goals: ?Patient currently max assist overall but was min assist before set back.  ? ?*See Care Plan and progress notes for long and short-term goals.  ? ?Revisions to Treatment Plan:  ?Bedside speech eval; changed to D2 diet with meds whole with puree ?  ?Teaching Needs: ?Safety, skin care, medications, transfers, toileting, etc. ?  ?Current Barriers to Discharge: ?Decreased caregiver support and Incontinence ? ?Possible Resolutions to Barriers: ?Family education ?HH follow up services  ?DME: RW, WC,  ?  ? ? Medical Summary ?Current Status: Decline in mobility, UTI, on IV antibiotics.  Intake starting to improve ?  Barriers to Discharge: IV antibiotics;Medical stability ?  ?Possible Resolutions to [employer]: Will need to have urology follow-up as outpatient, await urine culture results may need to adjust antibiotics ? ? ?Continued Need for Acute Rehabilitation Level of Care: The patient requires daily medical management by a physician with specialized training in physical medicine and rehabilitation for the following reasons: ?Direction of a  multidisciplinary physical rehabilitation program to maximize functional independence : Yes ?Medical management of patient stability for increased activity during participation in an intensive rehabilitation regime.: Yes ?Analysis of laboratory values and/or radiology reports with any subsequent need for medication adjustment and/or medical intervention. : Yes ? ? ?I attest that I was present, lead the team conference, and concur with the assessment and plan of the team. ? ? ?Dorien Chihuahua B ?05/25/2021, 4:08 PM  ? ? ? ? ? ? ?

## 2021-05-25 NOTE — Progress Notes (Signed)
Bladder scan: 724 ?Pt attempted to void in urinal was unsuccessful ?In and out cathed with 60f coude: 750  ?

## 2021-05-25 NOTE — Progress Notes (Signed)
Occupational Therapy Session Note ? ?Patient Details  ?Name: [name] ?MRN: [id number] ?Date of Birth: 1960-10-02 ? ?Today's Date: 05/25/2021 ?OT Individual Time: 4935-5217 ?OT Individual Time Calculation (min): 38 min  ? ? ?Short Term Goals: ?Week 1:  OT Short Term Goal 1 (Week 1): pt will complete >2 standing grooming task with CGA ?OT Short Term Goal 1 - Progress (Week 1): Met ?OT Short Term Goal 2 (Week 1): Pt will complete ambulatory toilet transfer with min A and LRAD ?OT Short Term Goal 2 - Progress (Week 1): Not met ?OT Short Term Goal 3 (Week 1): Pt will don pants with mod A ?OT Short Term Goal 3 - Progress (Week 1): Not met ?OT Short Term Goal 4 (Week 1): Pt will assist with donning surgical shoe wwith no more than mod A. ?OT Short Term Goal 4 - Progress (Week 1): Not met ?OT Short Term Goal 5 (Week 1): Pt will complete ambulatory toilet transfer with min A and LRAD ?Week 2:  OT Short Term Goal 1 (Week 2): Pt will assist with donning surgical shoe wwith no more than mod A. ?OT Short Term Goal 1 - Progress (Week 2): Met ?OT Short Term Goal 2 (Week 2): Pt will don pants with mod A ?OT Short Term Goal 2 - Progress (Week 2): Met ?OT Short Term Goal 3 (Week 2): Pt will don shirt with S. ?OT Short Term Goal 3 - Progress (Week 2): Met ?Week 3:  OT Short Term Goal 1 (Week 3): STG = LTG 2/2 ELOS ? ?Skilled Therapeutic Interventions/Progress Updates:  ?  Pt received semi-reclined in bed, denies pain and reports feeling much better, agreeable to therapy. Session focus on self-care retraining, activity tolerance, transfer retraining, therex in prep for improved ADL/IADL/func mobility performance + decreased caregiver burden. Heavy max A to come to sitting EOB, but improved performance and initiation from yesterday. Did maintain static sitting balance initially with CGA to close S, but with increased time melts into crouched posture with inability to self-correct. Total A to don pants and new brief, unable to pick  BLE off floor. Transferred back to supine for safety and total A to complete LBD. Pt able to roll R/L with max A.  ? ?Completed 3x5 of the following to promote BUE/BLE strengthening and activity tolerance: overhead reaches with theraball, BLE leg lifts, and cross body punches with use of red theraband.  ? ?Pt left semi-reclined in bed with ed alarm engaged, call bell in reach, and all immediate needs met.  ? ? ?Therapy Documentation ?Precautions:  ?Precautions ?Precautions: Fall ?Precaution Comments: baseline R HP and cognitive deficits ?Restrictions ?Weight Bearing Restrictions: Yes ?RLE Weight Bearing: Partial weight bearing ?Other Position/Activity Restrictions: heel WB with surgical shoe ? ?Pain: denies ?  ?ADL: See Care Tool for more details. ? ? ?Therapy/Group: Individual Therapy ? ?Volanda Napoleon MS, OTR/L ? ?05/25/2021, 6:51 AM ?

## 2021-05-25 NOTE — Progress Notes (Signed)
Physical Therapy Session Note ? ?Patient Details  ?Name: [name] ?MRN: [id number] ?Date of Birth: 1960/06/04 ? ?Today's Date: 05/25/2021 ?PT Individual Time: 4193-7902 and 4097-3532 ?PT Individual Time Calculation (min): 15 min and 39 min  ? ?Short Term Goals: ?Week 1:  PT Short Term Goal 1 (Week 1): pt will perform bed mobility with CGA ?PT Short Term Goal 1 - Progress (Week 1): Met ?PT Short Term Goal 2 (Week 1): Pt will trasnfer to [facility] with min assist consistently ?PT Short Term Goal 2 - Progress (Week 1): Met ?PT Short Term Goal 3 (Week 1): Pt will ambulate 131f with min assist and LRAD ?PT Short Term Goal 3 - Progress (Week 1): Met ?PT Short Term Goal 4 (Week 1): Pt will ascend 8 steps with min assist ?PT Short Term Goal 4 - Progress (Week 1): Progressing toward goal ?Week 2:  PT Short Term Goal 1 (Week 2): Pt will ambulate 1226fwith CGA and LRAD ?PT Short Term Goal 1 - Progress (Week 2): Met ?PT Short Term Goal 2 (Week 2): pt will ascend  8 steps with min assist consistently ?PT Short Term Goal 2 - Progress (Week 2): Progressing toward goal ?PT Short Term Goal 3 (Week 2): Pt will [facility] consistently ?PT Short Term Goal 3 - Progress (Week 2): Met ?PT Short Term Goal 4 (Week 2): Pt will propell WC 10030fith supervision assist ?PT Short Term Goal 4 - Progress (Week 2): Met ?Week 3:  PT Short Term Goal 1 (Week 3): STG=LTG due to ELOS ? ?Skilled Therapeutic Interventions/Progress Updates:  ?   ?Session 1 ?Pt received supine in bed with RN performed feeding task. PT returned and agreeable to PT. But extremely lethargic and mildly febrile 99.74F. attempted supine>sit with total A, but pt unable to remain awake.  Pt left supine in bed.  ? ?Sessoin 2  ? ?Pt received supine in bed and agreeable to PT. Supine>sit transfer with mod assist and cues for log roll technique. Sitting balance EOB with mod assist progressing to supervision assist with cues for anterior weight shift and WB through  Bil hands on EOB. Sit<>stand x 2 with mod assist on this day to achieve full standing. Lateral stepping to the R with mod assist with cues for step width.  Sit>supine completed with mod assist at BLE and left supine in bed with call bell in reach and all needs met.  ? ? ?   ? ? ?Therapy Documentation ?Precautions:  ?Precautions ?Precautions: Fall ?Precaution Comments: baseline R HP and cognitive deficits ?Restrictions ?Weight Bearing Restrictions: Yes ?RLE Weight Bearing: Partial weight bearing ?Other Position/Activity Restrictions: heel WB with surgical shoe ? ?Pain: ?denies ? ? ? ?Therapy/Group: Individual Therapy ? ?AusLorie Phenix/03/2021, 5:28 PM  ?

## 2021-05-25 NOTE — Progress Notes (Signed)
Lab called critical value: Lactic Acid 2.1  ?Pam PA notified ?

## 2021-05-25 NOTE — Plan of Care (Signed)
?  Problem: RH Bed to Chair Transfers ?Goal: LTG Patient will perform bed/chair transfers w/assist (PT) ?Description: LTG: Patient will perform bed to chair transfers with assistance (PT). ?Flowsheets (Taken 05/25/2021 1014) ?LTG: Pt will perform Bed to Chair Transfers with assistance level: Contact Guard/Touching assist ?Note: Downgraded due to regression in functional status ?  ?

## 2021-05-25 NOTE — Progress Notes (Signed)
Speech Language Pathology Daily Session Note ? ?Patient Details  ?Name: [name] ?MRN: [id number] ?Date of Birth: 1960/05/27 ? ?Today's Date: 05/25/2021 ?SLP Individual Time: 1325-1400 ?SLP Individual Time Calculation (min): 35 min ?Missed Time: 10 mins, fatigue  ? ?Short Term Goals: ?Week 2: SLP Short Term Goal 1 (Week 2): STG=LTG due to ELOS ? ?Skilled Therapeutic Interventions: Skilled treatment session focused on cognitive goals. Upon arrival, patient was asleep in bed and slow to rouse. SLP repositioned in bed to maximize arousal. Patient's arm was damp to the touch due to excessive sweating, nursing aware. Patient's oral tempeture taken: 99.0 degrees. SLP facilitated session by providing modifications to patient's calendar to maximize utilization. Patient able to identify the date with extra time and Min verbal cues. Patient unable to recall events from previous therapy sessions and reported that he did not have any therapy today. SLP also facilitated session with a verbal reasoning task that also focused on attention and recall. Patient completed task with 85% accuracy but required Max A multimodal cues to to maintain arousal. Due to patient's fatigue, patient unable to participate in treatment session effectively, therefore, patient missed remaining 10 minutes of session. Patient left upright in bed with alarm on and all needs within reach. Continue with current plan.  ?   ? ?Pain ?No/Denies Pain  ? ?Therapy/Group: Individual Therapy ? ?Jerrilyn Messinger ?05/25/2021, 2:15 PM ?

## 2021-05-25 NOTE — Progress Notes (Signed)
PHYSICAL MEDICINE & REHABILITATION PROGRESS NOTE ? ?Subjective/Complaints: ?Ate 100% breakfast  ?Still no urine micro available ?CXR ok ?Foot xray Right with post op changes  ?DIscussed with nsg, sutures removed yesterday no drainage  ?Spiked temp last noc but WBCs still normal  ? ?ROS: Denies CP, SOB, N/V/D ? ? ?Objective: ?Vital Signs: ?Blood pressure 139/76, pulse 81, temperature 99.1 ?F (37.3 ?C), resp. rate 17, height 5\' 7"  (1.702 m), weight 71.2 kg, SpO2 100 %. ?DG Chest 2 View ? ?Result Date: 05/24/2021 ?CLINICAL DATA:  Fever EXAM: CHEST - 2 VIEW COMPARISON:  04/29/2021 FINDINGS: The heart size and mediastinal contours are within normal limits. Both lungs are clear. The visualized skeletal structures are unremarkable. IMPRESSION: No active cardiopulmonary disease. Electronically Signed   By: Randa Ngo M.D.   On: 05/24/2021 23:45   ?Recent Labs  ?  05/24/21 ?2224 05/25/21 ?4270  ?WBC 4.7 4.6  ?HGB 12.3* 10.7*  ?HCT 36.1* 32.4*  ?PLT 191 178  ? ? ?Recent Labs  ?  05/23/21 ?0650 05/24/21 ?2224  ?NA 134* 133*  ?K 3.5 3.6  ?CL 102 100  ?CO2 25 22  ?GLUCOSE 176* 218*  ?BUN 24* 31*  ?CREATININE 0.98 0.96  ?CALCIUM 8.9 8.7*  ? ? ? ?Intake/Output Summary (Last 24 hours) at 05/25/2021 0828 ?Last data filed at 05/25/2021 0400 ?Gross per 24 hour  ?Intake 1209.2 ml  ?Output 1300 ml  ?Net -90.8 ml  ? ?  ? ?  ? ?Physical Exam: ?BP 139/76 (BP Location: Right Arm)   Pulse 81   Temp 99.1 ?F (37.3 ?C)   Resp 17   Ht 5\' 7"  (1.702 m)   Wt 71.2 kg   SpO2 100%   BMI 24.58 kg/m?  ?Gen: no distress, normal appearing ?HEENT: oral mucosa pink and moist, NCAT ?Cardio: Reg rate ?Chest: normal effort, normal rate of breathing ?Abd: soft, non-distended ?Ext: no edema ?Psych: pleasant, normal affect ?Oriented to person and place  ? ?4/24 ? ? ? ? ?05/25/21- well healed no tenderness, no erythema or discharge  ?Psych: no lability agitation  ?  ?Neuro: 4-/5 throughout,  ? ?Hand intrinsic atrophy with reduced sensation in 5th  fingers bilateral , also reduced sensation feet to LT , ? ?Assessment/Plan: ?1. Functional deficits which require 3+ hours per day of interdisciplinary therapy in a comprehensive inpatient rehab setting. ?Physiatrist is providing close team supervision and 24 hour management of active medical problems listed below. ?Physiatrist and rehab team continue to assess barriers to discharge/monitor patient progress toward functional and medical goals ? ? ?Care Tool: ? ?Bathing ?   ?Body parts bathed by patient: Right arm, Left arm, Chest, Abdomen, Front perineal area, Right upper leg, Left upper leg, Face  ? Body parts bathed by helper: Right lower leg, Left lower leg, Buttocks ?  ?  ?Bathing assist Assist Level: Moderate Assistance - Patient 50 - 74% (standing for washing buttocks) ?  ?  ?Upper Body Dressing/Undressing ?Upper body dressing   ?What is the patient wearing?: Pull over shirt ?   ?Upper body assist Assist Level: Supervision/Verbal cueing ?   ?Lower Body Dressing/Undressing ?Lower body dressing ? ? ?   ?What is the patient wearing?: Pants ? ?  ? ?Lower body assist Assist for lower body dressing: Minimal Assistance - Patient > 75% (reacher) ?   ? ?Toileting ?Toileting Toileting Activity did not occur (Probation officer and hygiene only): N/A (no void or bm)  ?Toileting assist Assist for toileting: Maximal Assistance -  Patient 25 - 49% ?  ?  ?Transfers ?Chair/bed transfer ? ?Transfers assist ?   ? ?Chair/bed transfer assist level: Contact Guard/Touching assist ?  ?  ?Locomotion ?Ambulation ? ? ?Ambulation assist ? ?   ? ?Assist level: Minimal Assistance - Patient > 75% ?Assistive device: Walker-rolling ?Max distance: 120 ft  ? ?Walk 10 feet activity ? ? ?Assist ?   ? ?Assist level: Minimal Assistance - Patient > 75% ?Assistive device: Walker-rolling  ? ?Walk 50 feet activity ? ? ?Assist Walk 50 feet with 2 turns activity did not occur: Safety/medical concerns ? ?Assist level: Minimal Assistance - Patient >  75% ?Assistive device: Walker-rolling  ? ? ?Walk 150 feet activity ? ? ?Assist Walk 150 feet activity did not occur: Safety/medical concerns ? ?  ?  ?  ? ?Walk 10 feet on uneven surface  ?activity ? ? ?Assist Walk 10 feet on uneven surfaces activity did not occur: Safety/medical concerns ? ? ?  ?   ? ?Wheelchair ? ? ? ? ?Assist Is the patient using a wheelchair?: Yes ?Type of Wheelchair: Manual ?  ? ?Wheelchair assist level: Minimal Assistance - Patient > 75% ?Max wheelchair distance: 150  ? ? ?Wheelchair 50 feet with 2 turns activity ? ? ? ?Assist ? ?  ?  ? ? ?Assist Level: Minimal Assistance - Patient > 75%  ? ?Wheelchair 150 feet activity  ? ? ? ?Assist ?   ? ? ?Assist Level: Minimal Assistance - Patient > 75%  ? ? ?Medical Problem List and Plan: ?1. Functional deficits secondary to peripheral arterial disease, RIght 1st ray, 2,3,4 toe amps due to osteomyelitis with pop art stenting Right side surgery and acute pulmonary embolus. ?Continue CIR PT, OT - ELOS 5/3- will need to extend to 5/6 due to UTI with decline in fxn  ? ?2.  Antithrombotics: ?-DVT/anticoagulation:  Pharmaceutical: had PE now on Eliquis- no sign of surgical site bleeding  ?            -antiplatelet therapy: Aspirin 81 mg daily ?3. Pain Management: Tylenol, tramadol as needed ? Monitor with increased exertion ?4. Onchomycosis of left toes: antifungal cream ordered BID.  ?5. Neuropsych: This patient is capable of making decisions on his own behalf. ?6. Right foot incision: ?            --Daily dry dressing change ?7. Fluids/Electrolytes/Nutrition: Routine I's and O's ?            -- Carb modified diet ?8: PAD: Status post Right popliteal stent placement ?            -- Continue aspirin while on Eliquis.  Stop Plavix. ?            -- Follow-up with Dr. Lucky Cowboy with ABIs ?            -- Status post right partial first ray amputation  ?            -- Right heel weightbearing with postop shoe- trial of darco sandal PT evaluating use  ?No swelling no  ?9:  prior CVA: ?10: Dyslipidemia: Continue Lipitor 40 mg daily ?11: Acute PE: Continue Eliquis ?12: Hypertension: Continue Norvasc 10 mg daily ?Vitals:  ? 05/25/21 0359 05/25/21 0607  ?BP: 139/76   ?Pulse: 81   ?Resp: 17   ?Temp: 99.5 ?F (37.5 ?C) 99.1 ?F (37.3 ?C)  ?SpO2: 100%   ? Controlled 5/2 but orthostatic will check BMET, order ACE wraps  ? ?13: DM  type II with peripheral neuropathy  Increase metformin to 500mg  BID  ? CBG (last 3) will order , pt states he has no CBG monitor at home  ?Recent Labs  ?  05/24/21 ?1644 05/24/21 ?2121 05/25/21 ?0947  ?GLUCAP 173* 210* 146*  ? ?CBG improving  ? ?14: Seizure disorder maintained on Keppra: Continue 500 mg daily, ? If PCP managed this in past  ?15: Constipation: give sorbitol times once dose now ?            --schedule senna BID, LBM 4/24, give sorbitol ?16: Urinary retention: d/c foley ?            UTI on ceftriaxone, afeb but still with reduced mobility vs last week , await  ?17: Chronic diastolic congestive heart failure, euvolemic, no diuretics ?            -- Grade 1 diastolic dysfunction on recent echo ?18.  Hyponatremia ? Na improved to 134, monitor weekly ?19.  Bradykinesia , masked facies, min cogwheeling, no hx PD , no showed for appt with  Dr Jaynee Eagles- would rec f/u post hospitalization   ?Hx of remote RIght subcortical infarcts which may cause a parkinson like syndrome  , seizure d/o ?20.  Fever up to 103 F last noc , +UTI, no cx result , abx regimen modified with addition of vanc to cover gm + , No evidence of PNA or wound infection  ?LOS: 19 days ?A FACE TO FACE EVALUATION WAS PERFORMED ? ?[name] ?05/25/2021, 8:28 AM ? ?

## 2021-05-25 NOTE — Progress Notes (Signed)
Discussed patient with Dr.Kakrakandy. He recommended getting imaging of foot as well as check of ESR in addition to fluid bolus. Vitals To repeat Lactic acid (ordered) and await results of cultures. If ESR elevated, he recommended getting CT of foot for work up.  ?

## 2021-05-26 ENCOUNTER — Inpatient Hospital Stay (HOSPITAL_COMMUNITY): Payer: Medicare Other

## 2021-05-26 DIAGNOSIS — M869 Osteomyelitis, unspecified: Secondary | ICD-10-CM

## 2021-05-26 DIAGNOSIS — E119 Type 2 diabetes mellitus without complications: Secondary | ICD-10-CM

## 2021-05-26 DIAGNOSIS — R509 Fever, unspecified: Secondary | ICD-10-CM

## 2021-05-26 LAB — GLUCOSE, CAPILLARY
Glucose-Capillary: 116 mg/dL — ABNORMAL HIGH (ref 70–99)
Glucose-Capillary: 106 mg/dL — ABNORMAL HIGH (ref 70–99)
Glucose-Capillary: 112 mg/dL — ABNORMAL HIGH (ref 70–99)
Glucose-Capillary: 114 mg/dL — ABNORMAL HIGH (ref 70–99)

## 2021-05-26 LAB — PROCALCITONIN: Procalcitonin: 0.15 ng/mL

## 2021-05-26 MED ORDER — TRAMADOL HCL 50 MG PO TABS
50.0000 mg | ORAL_TABLET | Freq: Two times a day (BID) | ORAL | Status: DC | PRN
  Administered 2021-05-27: 50 mg via ORAL
  Filled 2021-05-26: qty 1

## 2021-05-26 NOTE — Progress Notes (Signed)
Physical Therapy Session Note ? ?Patient Details  ?Name: [name] ?MRN: [id number] ?Date of Birth: 1960/03/12 ? ?Today's Date: 05/26/2021 ?PT Individual Time: 1749-4496 7591-6384 ?PT Individual Time Calculation (min): 40 min and 25 ? ?Short Term Goals: ? ?Week 3:  PT Short Term Goal 1 (Week 3): STG=LTG due to ELOS ? ?Skilled Therapeutic Interventions/Progress Updates:  ?Session 1 ?Pt received supine in bed and agreeable to PT. Supine>sit transfer with mod assist and cues for log roll sequencing.  ? ?Sit<>stand from EOB with max assist progressing to min assist from elevated bed height x 4 to don clean pants.  ?Ambulatory transfer to [facility] with RW and min assist once in standing for safety and improved control of AD.  ? ?Gait training in room x 76f with min assist and increased time due to slow pace and assist for AD management in turns.  ? ?[facility] 5 with UE support on RW and arm rest with assist to move RW forward to facilitate anterior weight shift.  ? ?Pt returned to room and performed ambulatory transfer to bed with min assist and RW. Sit>supine completed with min assist for BLE position and left supine in bed with call bell in reach and all needs met.  ? ?Session 2.  ? ?Pt received supine in bed and agreeable to PT. Supine>sit transfer with min assist and cues for log roll to push to sitting from semi reclined. Sitting balance EOB x 10 minutes with BUE support on bed with cues for trunkal position and anterior weight shift. Stand pivot transfer to recliner with mod asssist to power to standing and min assist for safety in turn with RW. Unable to locate LLE shoe, so pt transferred with RLE post-op shoe only. Pt left sitting in recliner with NT present.  ? ? ? ? ?   ? ?Therapy Documentation ?Precautions:  ?Precautions ?Precautions: Fall ?Precaution Comments: baseline R HP and cognitive deficits ?Restrictions ?Weight Bearing Restrictions: Yes ?RLE Weight Bearing: Partial weight  bearing ?Other Position/Activity Restrictions: heel WB with surgical shoe ? ?Pain: ?denies ? ? ? ?Therapy/Group: Individual Therapy ? ?ALorie Phenix?05/26/2021, 10:01 AM  ?

## 2021-05-26 NOTE — Progress Notes (Signed)
Speech Language Pathology Daily Session Note ? ?Patient Details  ?Name: [name] ?MRN: [id number] ?Date of Birth: 1960/08/30 ? ?Today's Date: 05/26/2021 ?SLP Individual Time: 0932-3557 ?SLP Individual Time Calculation (min): 40 min ? ?Short Term Goals: ?Week 2: SLP Short Term Goal 1 (Week 2): STG=LTG due to ELOS ? ?Skilled Therapeutic Interventions: Skilled ST treatment focused on cognitive and swallowing goals. Nurse and NT reported improved alertness, strength, and ability to self feed. Pt still benefiting from full supervision during meals for stand-by assist as needed. NT denied observable difficulty with chewing/swallowing with recent meals; no overt s/s of aspiration. SLP facilitated dys 3 PO trials with effective mastication, complete oral clearance post swallows, and no overt s/s of aspiration. Recommend pt advance to dys 3 textures with thin liquids. Continue meds in puree. Pt educated of recommendations and in agreement with plans for diet advancement. SLP facilitated session by providing min A for use of memory notebook to recall previous events from today. Pt required extended processing time and min A verbal cues to redirect attention to notebook, as pt had tendency to respond "I don't know" or "I don't remember." SLP provided encouragement for continuing use of memory notebook to improve recall and support self advocacy. Pt appeared neutral regarding continued use; does not appear very motivated about this. Patient was left in bed with alarm activated and immediate needs within reach at end of session. Continue per current plan of care.   ?   ?Pain ?Pain Assessment ?Pain Scale: 0-10 ?Pain Score: 0-No pain ? ?Therapy/Group: Individual Therapy ? ?[name] ?05/26/2021, 3:54 PM ?
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
## 2021-05-26 NOTE — Progress Notes (Signed)
[facility] PHYSICAL MEDICINE & REHABILITATION PROGRESS NOTE ? ?Subjective/Complaints: ?No foot pain , no drainage  ?UCx result reviewed- NG but this was obtained after being on Abx x 2d ?Original Cx requested off UA on 4/30 was not obtained  ? ?ROS: Denies CP, SOB, N/V/D ? ? ?Objective: ?Vital Signs: ?Blood pressure 135/78, pulse 79, temperature 99.2 ?F (37.3 ?C), resp. rate 17, height 5\' 7"  (1.702 m), weight 71.2 kg, SpO2 100 %. ?DG Chest 2 View ? ?Result Date: 05/24/2021 ?CLINICAL DATA:  Fever EXAM: CHEST - 2 VIEW COMPARISON:  04/29/2021 FINDINGS: The heart size and mediastinal contours are within normal limits. Both lungs are clear. The visualized skeletal structures are unremarkable. IMPRESSION: No active cardiopulmonary disease. Electronically Signed   By: Randa Ngo M.D.   On: 05/24/2021 23:45  ? ?DG Foot Complete Right ? ?Result Date: 05/25/2021 ?CLINICAL DATA:  Fever EXAM: RIGHT FOOT COMPLETE - 3 VIEW COMPARISON:  Foot radiograph dated May 04, 2021 FINDINGS: Prior distal first metatarsal and great toe amputation. Prior second toe amputation. No evidence of fracture or dislocation. Increased osseous irregularity of the distal second metatarsal. Soft tissue swelling of the forefoot IMPRESSION: Increased osseous irregularity of the distal second metatarsal when compared with prior radiograph. Findings are concerning for osteomyelitis, this could be further evaluated with MRI. Electronically Signed   By: Yetta Glassman M.D.   On: 05/25/2021 08:32   ?Recent Labs  ?  05/24/21 ?2224 05/25/21 ?5681  ?WBC 4.7 4.6  ?HGB 12.3* 10.7*  ?HCT 36.1* 32.4*  ?PLT 191 178  ? ? ? ?Recent Labs  ?  05/24/21 ?2224  ?NA 133*  ?K 3.6  ?CL 100  ?CO2 22  ?GLUCOSE 218*  ?BUN 31*  ?CREATININE 0.96  ?CALCIUM 8.7*  ? ? ? ?Intake/Output Summary (Last 24 hours) at 05/26/2021 0735 ?Last data filed at 05/26/2021 0014 ?Gross per 24 hour  ?Intake 680 ml  ?Output 719 ml  ?Net -39 ml  ? ?  ? ?  ? ?Physical Exam: ?BP 135/78 (BP Location: Right  Arm)   Pulse 79   Temp 99.2 ?F (37.3 ?C)   Resp 17   Ht 5\' 7"  (1.702 m)   Wt 71.2 kg   SpO2 100%   BMI 24.58 kg/m?  ?Gen: no distress, normal appearing ?HEENT: oral mucosa pink and moist, NCAT ?Cardio: Reg rate ?Chest: normal effort, normal rate of breathing ?Abd: soft, non-distended ?Ext: no edema ?Psych: pleasant, normal affect ?Oriented to person and place  ? ?4/24 ? ? ? ? ?05/25/21- well healed no tenderness, no erythema or discharge  ?Looks identical on 5/4 ?Psych: no lability agitation  ?  ?Neuro: 4-/5 throughout,  ? ?Hand intrinsic atrophy with reduced sensation in 5th fingers bilateral , also reduced sensation feet to LT , ? ?Assessment/Plan: ?1. Functional deficits which require 3+ hours per day of interdisciplinary therapy in a comprehensive inpatient rehab setting. ?Physiatrist is providing close team supervision and 24 hour management of active medical problems listed below. ?Physiatrist and rehab team continue to assess barriers to discharge/monitor patient progress toward functional and medical goals ? ? ?Care Tool: ? ?Bathing ?   ?Body parts bathed by patient: Right arm, Left arm, Chest, Abdomen, Front perineal area, Right upper leg, Left upper leg, Face  ? Body parts bathed by helper: Right lower leg, Left lower leg, Buttocks ?  ?  ?Bathing assist Assist Level: Moderate Assistance - Patient 50 - 74% (standing for washing buttocks) ?  ?  ?Upper Body Dressing/Undressing ?  Upper body dressing   ?What is the patient wearing?: Pull over shirt ?   ?Upper body assist Assist Level: Supervision/Verbal cueing ?   ?Lower Body Dressing/Undressing ?Lower body dressing ? ? ?   ?What is the patient wearing?: Pants ? ?  ? ?Lower body assist Assist for lower body dressing: Minimal Assistance - Patient > 75% (reacher) ?   ? ?Toileting ?Toileting Toileting Activity did not occur (Probation officer and hygiene only): N/A (no void or bm)  ?Toileting assist Assist for toileting: Maximal Assistance - Patient 25 -  49% ?  ?  ?Transfers ?Chair/bed transfer ? ?Transfers assist ?   ? ?Chair/bed transfer assist level: Contact Guard/Touching assist ?  ?  ?Locomotion ?Ambulation ? ? ?Ambulation assist ? ?   ? ?Assist level: Minimal Assistance - Patient > 75% ?Assistive device: Walker-rolling ?Max distance: 120 ft  ? ?Walk 10 feet activity ? ? ?Assist ?   ? ?Assist level: Minimal Assistance - Patient > 75% ?Assistive device: Walker-rolling  ? ?Walk 50 feet activity ? ? ?Assist Walk 50 feet with 2 turns activity did not occur: Safety/medical concerns ? ?Assist level: Minimal Assistance - Patient > 75% ?Assistive device: Walker-rolling  ? ? ?Walk 150 feet activity ? ? ?Assist Walk 150 feet activity did not occur: Safety/medical concerns ? ?  ?  ?  ? ?Walk 10 feet on uneven surface  ?activity ? ? ?Assist Walk 10 feet on uneven surfaces activity did not occur: Safety/medical concerns ? ? ?  ?   ? ?Wheelchair ? ? ? ? ?Assist Is the patient using a wheelchair?: Yes ?Type of Wheelchair: Manual ?  ? ?Wheelchair assist level: Minimal Assistance - Patient > 75% ?Max wheelchair distance: 150  ? ? ?Wheelchair 50 feet with 2 turns activity ? ? ? ?Assist ? ?  ?  ? ? ?Assist Level: Minimal Assistance - Patient > 75%  ? ?Wheelchair 150 feet activity  ? ? ? ?Assist ?   ? ? ?Assist Level: Minimal Assistance - Patient > 75%  ? ? ?Medical Problem List and Plan: ?1. Functional deficits secondary to peripheral arterial disease, RIght 1st ray, 2,3,4 toe amps due to osteomyelitis with pop art stenting Right side surgery and acute pulmonary embolus. ?Continue CIR PT, OT - ELOS 5/3- will need to extend to 5/6 due to UTI with decline in fxn  ? ?2.  Antithrombotics: ?-DVT/anticoagulation:  Pharmaceutical: had PE now on Eliquis- no sign of surgical site bleeding  ?            -antiplatelet therapy: Aspirin 81 mg daily ?3. Pain Management: Tylenol, tramadol as needed ? Monitor with increased exertion ?4. Onchomycosis of left toes: antifungal cream ordered BID.   ?5. Neuropsych: This patient is capable of making decisions on his own behalf. ?6. Right foot incision: ?            --Daily dry dressing change ?7. Fluids/Electrolytes/Nutrition: Routine I's and O's ?            -- Carb modified diet ?8: PAD: Status post Right popliteal stent placement ?            -- Continue aspirin while on Eliquis.  Stop Plavix. ?            -- Follow-up with Dr. Lucky Cowboy with ABIs ?            -- Status post right partial first ray amputation  ?            --  Right heel weightbearing with postop shoe- trial of darco sandal PT evaluating use  ?No swelling no  ?9: prior CVA: ?10: Dyslipidemia: Continue Lipitor 40 mg daily ?11: Acute PE: Continue Eliquis ?12: Hypertension: Continue Norvasc 10 mg daily ?Vitals:  ? 05/25/21 1950 05/26/21 0311  ?BP: 133/73 135/78  ?Pulse: 76 79  ?Resp: 18 17  ?Temp: 98.8 ?F (37.1 ?C) 99.2 ?F (37.3 ?C)  ?SpO2: 100% 100%  ? Controlled 5/4  ? ?13: DM type II with peripheral neuropathy  Increase metformin to 500mg  BID  ? CBG (last 3) will order , pt states he has no CBG monitor at home  ?Recent Labs  ?  05/25/21 ?1626 05/25/21 ?2059 05/26/21 ?0542  ?GLUCAP 155* 219* 116*  ? ?CBG improving  ? ?14: Seizure disorder maintained on Keppra: Continue 500 mg daily, ? If PCP managed this in past  ?15: Constipation: give sorbitol times once dose now ?            --schedule senna BID, LBM 4/24, give sorbitol ?16: Urinary retention: d/c foley ?            UTI on ceftriaxone, afeb but still with reduced mobility vs last week , await  ?17: Chronic diastolic congestive heart failure, euvolemic, no diuretics ?            -- Grade 1 diastolic dysfunction on recent echo ?18.  Hyponatremia ? Na improved to 134, monitor weekly ?19.  Bradykinesia , masked facies, min cogwheeling, no hx PD , no showed for appt with  Dr Jaynee Eagles- would rec f/u post hospitalization   ?Hx of remote RIght subcortical infarcts which may cause a parkinson like syndrome  , seizure d/o ?20.  Fever up to 103 F 5/2 , +UTI,  neg cx result  but was on keflex x 2 d when cx taken , abx regimen modified with addition of vanc to cover gm + , No clinical evidence of PNA or wound infection  ?Xray foot showing osseous irregularity , MRI pnd ask

## 2021-05-26 NOTE — Progress Notes (Signed)
Notified Risa Grill of MRI Results. No new orders at this time. ? ? ? ?Yehuda Mao, LPN ?

## 2021-05-26 NOTE — Progress Notes (Signed)
Physical Therapy Session Note ? ?Patient Details  ?Name: [name] ?MRN: [id number] ?Date of Birth: [birth year] ? ?Today's Date: 05/26/2021 ?PT Individual Time: 1015-1040 ?PT Individual Time Calculation (min): 25 min  ? ?Short Term Goals: ?Week 3:  PT Short Term Goal 1 (Week 3): STG=LTG due to ELOS ? ?Skilled Therapeutic Interventions/Progress Updates:  ?   ?Pt resting in bed to start with LPN at bedside setting up PIV. Pt awakens to voice and appears agreeable to PT tx. Denies pain. Pt with flat affect, delayed processing, and delayed initiation throughout session. Oriented x4. Limited verbalizations as well. Completed supine there-ex in bed to target LE strengthening and ROM. AAROM required for full ROM. ?-heel slides 1x10 bilaterally ?-hip abduction/adduction 1x10 bilaterally ?-ankle pumps 1x20 bilaterally ?-SAQ with bolster 1x10 bilaterally ?-hamstring stretching 5x30 second bilaterally ? ?Pt repositioned in bed and made comfortable. Call bell in lap, bed lowered and alarm on.  ? ?Therapy Documentation ?Precautions:  ?Precautions ?Precautions: Fall ?Precaution Comments: baseline R HP and cognitive deficits ?Restrictions ?Weight Bearing Restrictions: Yes ?RLE Weight Bearing: Partial weight bearing ?Other Position/Activity Restrictions: heel WB with surgical shoe ?General: ?  ? ? ?Therapy/Group: Individual Therapy ? ?Alger Simons PT ?05/26/2021, 7:39 AM  ?

## 2021-05-26 NOTE — Plan of Care (Signed)
?  Problem: RH Bed Mobility ?Goal: LTG Patient will perform bed mobility with assist (PT) ?Description: LTG: Patient will perform bed mobility with assistance, with/without cues (PT). ?Flowsheets (Taken 05/26/2021 1740) ?LTG: Pt will perform bed mobility with assistance level of: Contact Guard/Touching assist ?Note: Down graded due to functional regression from UTI ?  ?Problem: RH Bed to Chair Transfers ?Goal: LTG Patient will perform bed/chair transfers w/assist (PT) ?Description: LTG: Patient will perform bed to chair transfers with assistance (PT). ?Flowsheets (Taken 05/26/2021 1740) ?LTG: Pt will perform Bed to Chair Transfers with assistance level: Minimal Assistance - Patient > 75% ?  ?Problem: RH Car Transfers ?Goal: LTG Patient will perform car transfers with assist (PT) ?Description: LTG: Patient will perform car transfers with assistance (PT). ?Flowsheets (Taken 05/26/2021 1740) ?LTG: Pt will perform car transfers with assist:: Minimal Assistance - Patient > 75% ?Note: Down graded due to functional regression from UTI ?  ?Problem: RH Ambulation ?Goal: LTG Patient will ambulate in controlled environment (PT) ?Description: LTG: Patient will ambulate in a controlled environment, # of feet with assistance (PT). ?Flowsheets ?Taken 05/26/2021 1740 ?LTG: Pt will ambulate in controlled environ  assist needed:: Minimal Assistance - Patient > 75% ?Taken 05/07/2021 1814 ?LTG: Ambulation distance in controlled environment: 175ft with LRAD ?Note: Down graded due to functional regression from UTI ?Goal: LTG Patient will ambulate in home environment (PT) ?Description: LTG: Patient will ambulate in home environment, # of feet with assistance (PT). ?Flowsheets ?Taken 05/26/2021 1740 ?LTG: Pt will ambulate in home environ  assist needed:: Minimal Assistance - Patient > 75% ?Taken 05/07/2021 1814 ?LTG: Ambulation distance in home environment: 1ft with lRAD ?Note: Down graded due to functional regression from UTI ?  ?Problem: RH  Wheelchair Mobility ?Goal: LTG Patient will propel w/c in controlled environment (PT) ?Description: LTG: Patient will propel wheelchair in controlled environment, # of feet with assist (PT) ?Flowsheets (Taken 05/26/2021 1740) ?LTG: Pt will propel w/c in controlled environ  assist needed:: Supervision/Verbal cueing ?LTG: Propel w/c distance in controlled environment: 100 ?Note: Down graded due to functional regression from UTI ?  ?

## 2021-05-27 LAB — GLUCOSE, CAPILLARY
Glucose-Capillary: 100 mg/dL — ABNORMAL HIGH (ref 70–99)
Glucose-Capillary: 116 mg/dL — ABNORMAL HIGH (ref 70–99)
Glucose-Capillary: 114 mg/dL — ABNORMAL HIGH (ref 70–99)
Glucose-Capillary: 93 mg/dL (ref 70–99)

## 2021-05-27 NOTE — Progress Notes (Signed)
Physical Therapy Weekly Progress Note ? ?Patient Details  ?Name: [name] ?MRN: [id number] ?Date of Birth: December 28, 1960 ? ?Beginning of progress report period: May 20, 2021 ?End of progress report period: May 27, 2021 ? ?Today's Date: 05/27/2021 ?PT Individual Time: 8325-4982 ?PT Individual Time Calculation (min): 53 min  ? ?Patient has met 1 of 1 short term goals.  Pt demonstrated functional decline over the last week due to UTI. Mils improvements noted n the past few days, but continues to be limited due to apraxia and functioning at a mod assist level compared to min-CGA prior to UTI. Family has not been present for education, despite requesting eduction as pt has 1 flight of steps to access home.  ? ?Patient continues to demonstrate the following deficits muscle weakness and muscle joint tightness, decreased cardiorespiratoy endurance, motor apraxia and decreased motor planning, decreased initiation, decreased attention, decreased awareness, decreased problem solving, decreased memory, and delayed processing, and decreased sitting balance, decreased standing balance, decreased postural control, hemiplegia, and decreased balance strategies and therefore will continue to benefit from skilled PT intervention to increase functional independence with mobility. ? ?Patient progressing toward long term goals..  Continue plan of care. ? ?PT Short Term Goals ?Week 3:  PT Short Term Goal 1 (Week 3): STG=LTG due to ELOS ?PT Short Term Goal 1 - Progress (Week 3): Progressing toward goal ?Week 4:  PT Short Term Goal 1 (Week 4): STG=LTG due to ELOS ? ?Skilled Therapeutic Interventions/Progress Updates:  ?Pt received sitting in recliner and agreeable to PT. Pt performed stand pivot transfer with mod assist and cues from PT for safety and motor planning. Transported to rehab gym.  ? ?Gait training with RW 2 x 50f with mod assist due to increasing R lateral lean with fatigue. Required prolonged rest break due to fatigue. Assist  for AD managemnt in turns as well. ? ?Stand pivot transfer to and from nustep with mod assist due to difficulty with anterior weight shift Nustep endurance training x 7 minutes. With min  assist from PT for improved ROM throughout and cues for increased amplitude of movement.  ? ?Patient returned to room and performed stand pivot to recliner with mod assist and RW for safety and AD management. Pt left sitting in recliner with call bell in reach and all needs met.   ? ?   ? ?Therapy Documentation ?Precautions:  ?Precautions ?Precautions: Fall ?Precaution Comments: baseline R HP and cognitive deficits ?Restrictions ?Weight Bearing Restrictions: Yes ?RLE Weight Bearing: Partial weight bearing ?Other Position/Activity Restrictions: heel WB with surgical shoe ? ?Pain: ?  denies ?Therapy/Group: Individual Therapy ? ?ALorie Phenix?05/27/2021, 9:01 AM  ?

## 2021-05-27 NOTE — Progress Notes (Signed)
Patient ID: [name], male   DOB: Jun 17, 1960, 61 y.o.   MRN: [id number] ? ?This SW covering for primary SW, Erlene Quan. ? ?Medical team highly suggests family education for this patient given his recent decline since recovering from UTI.  ? ?SW spoke with pt sister [name] to discuss family edu today. Reports she and her sister are at work, and their brother does not have a car to come in. States she can come in for family edu on Monday. Also reports her concerns is if he is able to get up and down on his own to get to the bathroom. States he wll have 24/7 care as their brother will be there with  him, and he will not have to walk up any stairs as he will stay on main level. Reports only a threshold step to get into the  home. SW informed will bring updates to medical team and follow-up. SW also informed will send out SNF referral just in case they do not feel he is appropriate. She reports he has asked her not to send him to a SNF, so she would like to avoid this if possible. SW will submit PCS referral prior to discharge. SW explained SNF placement process and PCS referral process.  ? ?*Attending reports pt can d/c on Tuesday (5/9). SW called pt sister [name] to give updates on the changes.  ? ?SW will discuss with family on Monday during family edu best plan of care.  ? ?NCPASRR#: [id number] ?SW sent out SNF referral.  ? ? ?[name], MSW, LCSWA ?Office: [phone] ?Cell: [phone] ?Fax: [phone]  ?

## 2021-05-27 NOTE — Progress Notes (Addendum)
PHYSICAL MEDICINE & REHABILITATION PROGRESS NOTE ? ?Subjective/Complaints: ? ?Appreciate ID  note rec d/c abx , adequate treatment for UTI , last cath ~48hr ago ? ?ROS: Denies CP, SOB, N/V/D ? ? ?Objective: ?Vital Signs: ?Blood pressure 130/69, pulse 79, temperature 99.6 ?F (37.6 ?C), resp. rate 18, height 5\' 7"  (1.702 m), weight 71.2 kg, SpO2 98 %. ?MR FOOT RIGHT WO CONTRAST ? ?Result Date: 05/26/2021 ?CLINICAL DATA:  Bone mass or bone pain, foot, aggressive features on xray EXAM: MRI OF THE RIGHT FOREFOOT WITHOUT CONTRAST TECHNIQUE: Multiplanar, multisequence MR imaging of the right forefoot was performed. No intravenous contrast was administered. COMPARISON:  Small radiograph 05/25/2021, right foot MRI 04/27/2021 FINDINGS: Bones/Joint/Cartilage Prior great toe amputation and recent first metatarsal head resection. There is marrow edema within the residual first metatarsal, with low T1 signal at the distal resection margin. Prior second toe amputation. Unchanged appearance of the second metatarsal head with small erosions on the margins, and otherwise no significant marrow edema or low T1 signal. There is no other significant marrow signal alteration in the forefoot. Ligaments Intact Lisfranc ligament. Muscles and Tendons Diffuse intramuscular edema muscle atrophy in the foot as is commonly seen in diabetics. There is a collection of fluid along the expected course of the adductor hallucis tendon, which is likely tenosynovial fluid related to the previous first toe amputation (series 7, image 15). Soft tissues Diffuse soft tissue swelling of the foot. There is no definite drainable collection underlying the amputation site. IMPRESSION: Prior great toe amputation and recent first metatarsal head resection. There is marrow edema and some low T1 signal at the distal resection margin in the residual first metatarsal, favored to represent postsurgical changes due to recent amputation, though developing  osteomyelitis could have a similar appearance. Prior second toe amputation. Unchanged appearance of the second metatarsal head without evidence of osteomyelitis. Fluid collecting along the expected course of the adductor hallucis tendon, likely reactive tenosynovial fluid related to the recent amputation. Forefoot soft tissue swelling without other evidence of well-defined/drainable fluid collection underlying the amputation site. Electronically Signed   By: Maurine Simmering M.D.   On: 05/26/2021 10:12   ?Recent Labs  ?  05/24/21 ?2224 05/25/21 ?8546  ?WBC 4.7 4.6  ?HGB 12.3* 10.7*  ?HCT 36.1* 32.4*  ?PLT 191 178  ? ? ? ?Recent Labs  ?  05/24/21 ?2224  ?NA 133*  ?K 3.6  ?CL 100  ?CO2 22  ?GLUCOSE 218*  ?BUN 31*  ?CREATININE 0.96  ?CALCIUM 8.7*  ? ? ? ?Intake/Output Summary (Last 24 hours) at 05/27/2021 0810 ?Last data filed at 05/26/2021 2100 ?Gross per 24 hour  ?Intake 600 ml  ?Output 661 ml  ?Net -61 ml  ? ?  ? ?  ? ?Physical Exam: ?BP 130/69 (BP Location: Left Arm)   Pulse 79   Temp 99.6 ?F (37.6 ?C)   Resp 18   Ht 5\' 7"  (1.702 m)   Wt 71.2 kg   SpO2 98%   BMI 24.58 kg/m?  ?Gen: no distress, normal appearing ?HEENT: oral mucosa pink and moist, NCAT ?Cardio: Reg rate ?Chest: normal effort, normal rate of breathing ?Abd: soft, non-distended ?Ext: no edema ?Psych: pleasant, normal affect ?Oriented to person and place  ? ?4/24 ? ? ? ? ?05/25/21- well healed no tenderness, no erythema or discharge  ?Looks identical on 5/4 ?Psych: no lability agitation  ?  ?Neuro: 4-/5 throughout,  ? ?Hand intrinsic atrophy with reduced sensation in 5th fingers bilateral , also  reduced sensation feet to LT , ? ?Assessment/Plan: ?1. Functional deficits which require 3+ hours per day of interdisciplinary therapy in a comprehensive inpatient rehab setting. ?Physiatrist is providing close team supervision and 24 hour management of active medical problems listed below. ?Physiatrist and rehab team continue to assess barriers to discharge/monitor  patient progress toward functional and medical goals ? ? ?Care Tool: ? ?Bathing ?   ?Body parts bathed by patient: Face, Chest  ? Body parts bathed by helper: Right lower leg, Left lower leg, Buttocks, Right arm, Left arm, Abdomen, Left upper leg ?  ?  ?Bathing assist Assist Level: Total Assistance - Patient < 25% ?  ?  ?Upper Body Dressing/Undressing ?Upper body dressing   ?What is the patient wearing?: Pull over shirt ?   ?Upper body assist Assist Level: Maximal Assistance - Patient 25 - 49% ?   ?Lower Body Dressing/Undressing ?Lower body dressing ? ? ?   ?What is the patient wearing?: Pants ? ?  ? ?Lower body assist Assist for lower body dressing: Total Assistance - Patient < 25% ?   ? ?Toileting ?Toileting Toileting Activity did not occur (Probation officer and hygiene only): N/A (no void or bm)  ?Toileting assist Assist for toileting: Maximal Assistance - Patient 25 - 49% ?  ?  ?Transfers ?Chair/bed transfer ? ?Transfers assist ?   ? ?Chair/bed transfer assist level: Dependent - mechanical lift (stedy) ?  ?  ?Locomotion ?Ambulation ? ? ?Ambulation assist ? ?   ? ?Assist level: Minimal Assistance - Patient > 75% ?Assistive device: Walker-rolling ?Max distance: 120 ft  ? ?Walk 10 feet activity ? ? ?Assist ?   ? ?Assist level: Minimal Assistance - Patient > 75% ?Assistive device: Walker-rolling  ? ?Walk 50 feet activity ? ? ?Assist Walk 50 feet with 2 turns activity did not occur: Safety/medical concerns ? ?Assist level: Minimal Assistance - Patient > 75% ?Assistive device: Walker-rolling  ? ? ?Walk 150 feet activity ? ? ?Assist Walk 150 feet activity did not occur: Safety/medical concerns ? ?  ?  ?  ? ?Walk 10 feet on uneven surface  ?activity ? ? ?Assist Walk 10 feet on uneven surfaces activity did not occur: Safety/medical concerns ? ? ?  ?   ? ?Wheelchair ? ? ? ? ?Assist Is the patient using a wheelchair?: Yes ?Type of Wheelchair: Manual ?  ? ?Wheelchair assist level: Minimal Assistance - Patient > 75% ?Max  wheelchair distance: 150  ? ? ?Wheelchair 50 feet with 2 turns activity ? ? ? ?Assist ? ?  ?  ? ? ?Assist Level: Minimal Assistance - Patient > 75%  ? ?Wheelchair 150 feet activity  ? ? ? ?Assist ?   ? ? ?Assist Level: Minimal Assistance - Patient > 75%  ? ? ?Medical Problem List and Plan: ?1. Functional deficits secondary to peripheral arterial disease, RIght 1st ray, 2,3,4 toe amps due to osteomyelitis with pop art stenting Right side surgery and acute pulmonary embolus. ?Continue CIR PT, OT - ELOS 5/3- will need to extend to 5/6 due to UTI with decline in fxn  ?Stable for d/c medically but now at max A x 1 (improved from max A x 2 earlier this week)  ?Expect slow progress with reconditioning, rec SNF unless family able to handle min/mod A level , family training Monday  ? ?2.  Antithrombotics: ?-DVT/anticoagulation:  Pharmaceutical: had PE now on Eliquis- no sign of surgical site bleeding  ?            -  antiplatelet therapy: Aspirin 81 mg daily ?3. Pain Management: Tylenol, tramadol as needed ? Monitor with increased exertion ?4. Onchomycosis of left toes: antifungal cream ordered BID.  ?5. Neuropsych: This patient is capable of making decisions on his own behalf. ?6. Right foot incision: ?            --Daily dry dressing change ?7. Fluids/Electrolytes/Nutrition: Routine I's and O's ?            -- Carb modified diet ?8: PAD: Status post Right popliteal stent placement ?            -- Continue aspirin while on Eliquis.  Stop Plavix. ?            -- Follow-up with Dr. Lucky Cowboy with ABIs ?            -- Status post right partial first ray amputation  ?            -- Right heel weightbearing with postop shoe- trial of darco sandal PT evaluating use  ?No swelling no  ?9: prior CVA: ?10: Dyslipidemia: Continue Lipitor 40 mg daily ?11: Acute PE: Continue Eliquis ?12: Hypertension: Continue Norvasc 10 mg daily ?Vitals:  ? 05/26/21 2038 05/27/21 0325  ?BP: 136/78 130/69  ?Pulse: 80 79  ?Resp: 18 18  ?Temp: 98.9 ?F (37.2 ?C)  99.6 ?F (37.6 ?C)  ?SpO2: 100% 98%  ? Controlled 5/4  ? ?13: DM type II with peripheral neuropathy  Increase metformin to 500mg  BID  ? CBG (last 3) will order , pt states he has no CBG monitor at home  ?Recent Lab

## 2021-05-27 NOTE — [place] FL2 (Signed)
?[place] MEDICAID FL2 LEVEL OF CARE SCREENING TOOL  ?  ? ?IDENTIFICATION  ?Patient Name: ?[name] Birthdate: 28-May-1960 Sex: male Admission Date (Current Location): ?05/06/2021  ?[place] and [place] Number: ? [place] ?[id number] N Facility and Address:  ?[facility] [place] [address] [place], [place] [address] ?     Provider Number: ?[id number]  ?Attending Physician Name and Address:  ?Charlett Blake, MD ? Relative Name and Phone Number:  ?Finneas Mathe (brother) [phone] ; Godson Pollan (sister) [phone] ?   ?Current Level of Care: ?Hospital Recommended Level of Care: ?Nursing Facility Prior Approval Number: ?  ? ?Date Approved/Denied: ?  PASRR Number: ?[id number] A ? ?Discharge Plan: ?SNF ?  ? ?Current Diagnoses: ?Patient Active Problem List  ? Diagnosis Date Noted  ? Fever   ? Osteomyelitis ([place])   ? Controlled type 2 diabetes mellitus with hyperglycemia, without long-term current use of insulin ([place])   ? Pulmonary embolism ([place]) 05/06/2021  ? Chronic diastolic CHF (congestive heart failure) ([place]) 05/02/2021  ? Hyponatremia 05/02/2021  ? Acute urinary retention 05/01/2021  ? Peripheral artery disease ([place]) 04/30/2021  ? Foot osteomyelitis, right ([place]) 04/27/2021  ? Open wound of right foot 04/26/2021  ? Generalized weakness 04/26/2021  ? Acute pulmonary embolism ([place]) 04/25/2021  ? Elevated troponin   ? CVA (cerebral vascular accident) ([place]) 04/23/2021  ? Seizure disorder ([place]) 09/16/2018  ? Essential hypertension 09/14/2018  ? TIA (transient ischemic attack) 09/14/2018  ? Right sided weakness 09/14/2018  ? Stroke ([place]) 01/27/2018  ? Ataxia 01/26/2018  ? Hypertensive urgency 01/26/2018  ? Diabetes mellitus type II, non insulin dependent ([place]) 01/26/2018  ? Hypokalemia 01/26/2018  ? 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 ? ?Orientation RESPIRATION BLADDER Height & Weight   ?  ?Self, Time, Situation, Place ? Normal Incontinent Weight: 156 lb 15.5 oz (71.2 kg) ?Height:  5\' 7"   (170.2 cm)  ?BEHAVIORAL SYMPTOMS/MOOD NEUROLOGICAL BOWEL NUTRITION STATUS  ?    Incontinent Diet (D3 thin)  ?AMBULATORY STATUS COMMUNICATION OF NEEDS Skin   ?Limited Assist Verbally Other (Comment) (R foot- 3 toes amputated; apply dry gauze, Kerlix, and ace wrap) ?  ?  ?  ?    ?     ?     ? ? ?Personal Care Assistance Level of Assistance  ?    ?  ?  ?   ? ?Functional Limitations Info  ?Sight, Speech, Hearing Sight Info: Adequate ?  ?Speech Info: Adequate  ? ? ?SPECIAL CARE FACTORS FREQUENCY  ?PT (By licensed PT), OT (By licensed OT), Speech therapy   ?  ?PT Frequency: 5xs per week ?OT Frequency: 5xs per week ?  ?  ?Speech Therapy Frequency: 5xs per week ?   ? ? ?Contractures Contractures Info: Not present  ? ? ?Additional Factors Info  ?Code Status, Allergies, Insulin Sliding Scale Code Status Info: Full ?Allergies Info: NKA ?  ?Insulin Sliding Scale Info: See d/c instructions for insulin sliding scale (Novolog) ?  ?   ? ?Current Medications (05/27/2021):  This is the current hospital active medication list ?Current Facility-Administered Medications  ?Medication Dose Route Frequency Provider Last Rate Last Admin  ? 0.9 %  sodium chloride infusion   Intravenous PRN Charlett Blake, MD 10 mL/hr at 05/25/21 1807 200 mL at 05/25/21 1807  ? acetaminophen (TYLENOL) tablet 325-650 mg  325-650 mg Oral Q4H PRN Barbie Banner, PA-C   650 mg at 05/25/21 1137  ? alum & mag hydroxide-simeth (MAALOX/MYLANTA) 200-200-20 MG/5ML suspension  30 mL  30 mL Oral Q4H PRN Setzer, Edman Circle, PA-C      ? amLODipine (NORVASC) tablet 10 mg  10 mg Oral Daily Barbie Banner, PA-C   10 mg at 05/27/21 2671  ? apixaban (ELIQUIS) tablet 5 mg  5 mg Oral BID Barbie Banner, PA-C   5 mg at 05/27/21 2458  ? aspirin EC tablet 81 mg  81 mg Oral Daily Barbie Banner, PA-C   81 mg at 05/27/21 0998  ? atorvastatin (LIPITOR) tablet 40 mg  40 mg Oral Daily Barbie Banner, PA-C   40 mg at 05/26/21 2058  ? bethanechol (URECHOLINE) tablet 10 mg  10 mg  Oral TID Charlett Blake, MD   10 mg at 05/27/21 3382  ? diphenhydrAMINE (BENADRYL) 12.5 MG/5ML elixir 12.5-25 mg  12.5-25 mg Oral Q6H PRN Barbie Banner, PA-C      ? insulin aspart (novoLOG) injection 0-5 Units  0-5 Units Subcutaneous QHS Onnie Boer Q, RPH-CPP   2 Units at 05/25/21 2122  ? insulin aspart (novoLOG) injection 0-9 Units  0-9 Units Subcutaneous TID WC Pham, Minh Q, RPH-CPP   2 Units at 05/25/21 1752  ? levETIRAcetam (KEPPRA) tablet 500 mg  500 mg Oral BID Barbie Banner, PA-C   500 mg at 05/27/21 5053  ? metFORMIN (GLUCOPHAGE) tablet 500 mg  500 mg Oral BID WC Raulkar, Clide Deutscher, MD   500 mg at 05/27/21 9767  ? miconazole (MICOTIN) 2 % cream   Topical BID Raulkar, Clide Deutscher, MD   Given at 05/27/21 0809  ? polyethylene glycol (MIRALAX / GLYCOLAX) packet 17 g  17 g Oral Daily PRN Barbie Banner, PA-C   17 g at 05/26/21 1818  ? prochlorperazine (COMPAZINE) tablet 5-10 mg  5-10 mg Oral Q6H PRN Barbie Banner, PA-C      ? Or  ? prochlorperazine (COMPAZINE) injection 5-10 mg  5-10 mg Intramuscular Q6H PRN Vaughan Basta Edman Circle, PA-C      ? Or  ? prochlorperazine (COMPAZINE) suppository 12.5 mg  12.5 mg Rectal Q6H PRN Setzer, Edman Circle, PA-C      ? senna (SENOKOT) tablet 8.6 mg  1 tablet Oral BID Barbie Banner, PA-C   8.6 mg at 05/27/21 0809  ? sodium phosphate (FLEET) 7-19 GM/118ML enema 1 enema  1 enema Rectal Once PRN Setzer, Edman Circle, PA-C      ? sorbitol 70 % solution 30 mL  30 mL Oral Daily PRN Barbie Banner, PA-C   30 mL at 05/27/21 0809  ? tamsulosin (FLOMAX) capsule 0.4 mg  0.4 mg Oral QPC supper Barbie Banner, PA-C   0.4 mg at 05/26/21 1737  ? traMADol (ULTRAM) tablet 50 mg  50 mg Oral Q12H PRN Love, Pamela S, PA-C      ? traZODone (DESYREL) tablet 25-50 mg  25-50 mg Oral QHS PRN Barbie Banner, PA-C      ? ? ? ?Discharge Medications: ?Please see discharge summary for a list of discharge medications. ? ?Relevant Imaging Results: ? ?Relevant Lab Results: ? ? ?Additional  Information ?HA#:[id number] ? ?Rana Snare, LCSW ? ? ? ? ?

## 2021-05-27 NOTE — Progress Notes (Signed)
Speech Language Pathology Daily Session Note ? ?Patient Details  ?Name: [name] ?MRN: [id number] ?Date of Birth: 1960-02-06 ? ?Today's Date: 05/27/2021 ?SLP Individual Time: 5784-6962 ?SLP Individual Time Calculation (min): 35 min and Today's Date: 05/27/2021 ?SLP Missed Time: 10 Minutes ?Missed Time Reason: Nursing care ? ?Short Term Goals: ?Week 2: SLP Short Term Goal 1 (Week 2): STG=LTG due to ELOS ? ?Skilled Therapeutic Interventions: Skilled ST treatment focused on cognitive goals. Pt missed initial 10 minutes of session due to nursing needs. Pt was received in semi-reclined in bed, alert, and participative this date. SLP facilitated basic-mildly complex problem solving, error awareness, recall, and sustained attention in novel card task [place] with overall mod A. Most consistent cueing was needed for error awareness and alternating attention between center card and pt's personal deck. Patient was left in bed with alarm activated and immediate needs within reach at end of session. Continue per current plan of care.   ?   ? ?Pain ?Pain Assessment ?Pain Scale: 0-10 ?Pain Score: 0-No pain ? ?Therapy/Group: Individual Therapy ? ?Llewellyn Choplin T Sammye Staff ?05/27/2021, 4:43 PM ?

## 2021-05-27 NOTE — Progress Notes (Signed)
Occupational Therapy Session Note ? ?Patient Details  ?Name: [name] ?MRN: [id number] ?Date of Birth: 1960-08-24 ? ?Today's Date: 05/27/2021 ?OT Individual Time: 8882-8003 ?OT Individual Time Calculation (min): 61 min + 41 min ? ? ?Short Term Goals: ?Week 1:  OT Short Term Goal 1 (Week 1): pt will complete >2 standing grooming task with CGA ?OT Short Term Goal 1 - Progress (Week 1): Met ?OT Short Term Goal 2 (Week 1): Pt will complete ambulatory toilet transfer with min A and LRAD ?OT Short Term Goal 2 - Progress (Week 1): Not met ?OT Short Term Goal 3 (Week 1): Pt will don pants with mod A ?OT Short Term Goal 3 - Progress (Week 1): Not met ?OT Short Term Goal 4 (Week 1): Pt will assist with donning surgical shoe wwith no more than mod A. ?OT Short Term Goal 4 - Progress (Week 1): Not met ?OT Short Term Goal 5 (Week 1): Pt will complete ambulatory toilet transfer with min A and LRAD ?Week 2:  OT Short Term Goal 1 (Week 2): Pt will assist with donning surgical shoe wwith no more than mod A. ?OT Short Term Goal 1 - Progress (Week 2): Met ?OT Short Term Goal 2 (Week 2): Pt will don pants with mod A ?OT Short Term Goal 2 - Progress (Week 2): Met ?OT Short Term Goal 3 (Week 2): Pt will don shirt with S. ?OT Short Term Goal 3 - Progress (Week 2): Met ?Week 3:  OT Short Term Goal 1 (Week 3): STG = LTG 2/2 ELOS ? ?Skilled Therapeutic Interventions/Progress Updates:  ?  Session 1 [id number]): Pt received awake semi-reclined in bed, denies pain, agreeable to therapy. Session focus on self-care retraining, activity tolerance, transfer retraining in prep for improved ADL/IADL/func mobility performance + decreased caregiver burden. Came to sitting EOB with mod A to lift trunk and to scoot hips forward. Total A to don L shoe/R darco. Attempted sit to stand x3 with RW and elevated height bed, pt able to come off of bed but unable to fully extend trunk and hips for safe ambulation. Stedy transfer with max A of 1 to clear hips  for stedy seat > recliner. Effectively total A for seated UB/LB bathing due to increasing R lean and poor inability to self-correct. Pt reports "I feel like it's happening again.Marland Kitchenibuprofen feel weak."  ? ?Max A to don shirt, total A and use of stedy for sit to stand to don pants and to scoot back in chair. Mod A to apply lotion under RUE and dep to apply lotion to BLE. Pt was able to self-feed breakfast with distant S. MD in/out morning rounds. Pt did report increase in discomfort due "bed sores" on bottom, lowered BLE with reported improvement in pain. ? ?Pt left seated in recliner awaiting following PT session with safety belt alarm engaged, call bell in reach, and all immediate needs met.  ? ? ?Session 2 [phone]): Pt received seated in recliner, leaning to the R, agreeable to therapy. Session focus on self-care retraining, activity tolerance, transfer retraining in prep for improved ADL/IADL/func mobility performance + decreased caregiver burden. No c/o pain. Requesting to use bathroom. Stedy transfer with mod A to facilitiate upright posture > toilet. Total A for clothing management and close S for toileting due to R lean. No void after extended time given. Stedy transfer in similar manner > w/c. Noted improvement in ability to power up since this morning.  ? ?Pt completed sit to stand from w/c  height with use of RW and mod A to power up and overcome posterior bias. Attempted short ambulatory transfer to mat table but pt with difficulty progressing RLE and BLE shaking. Able to take several steps with overall light mod A, but requried seated rest break afterwards due to increasing fatigue. ? ?Pt left seated in w/c with safety belt alarm engaged, call bell in reach, and all immediate needs met.  ? ? ?Therapy Documentation ?Precautions:  ?Precautions ?Precautions: Fall ?Precaution Comments: baseline R HP and cognitive deficits ?Restrictions ?Weight Bearing Restrictions: Yes ?RLE Weight Bearing: Partial weight  bearing ?Other Position/Activity Restrictions: heel WB with surgical shoe ? ?Pain: see session note ?  ?ADL: See Care Tool for more details. ? ?Therapy/Group: Individual Therapy ? ?Volanda Napoleon MS, OTR/L ? ?05/27/2021, 6:52 AM ?

## 2021-05-28 DIAGNOSIS — M868X7 Other osteomyelitis, ankle and foot: Secondary | ICD-10-CM

## 2021-05-28 LAB — GLUCOSE, CAPILLARY
Glucose-Capillary: 73 mg/dL (ref 70–99)
Glucose-Capillary: 124 mg/dL — ABNORMAL HIGH (ref 70–99)
Glucose-Capillary: 73 mg/dL (ref 70–99)
Glucose-Capillary: 180 mg/dL — ABNORMAL HIGH (ref 70–99)

## 2021-05-28 MED ORDER — MELATONIN 3 MG PO TABS
3.0000 mg | ORAL_TABLET | Freq: Every day | ORAL | Status: DC
  Administered 2021-05-28 – 2021-06-01 (×5): 3 mg via ORAL
  Filled 2021-05-28 (×5): qty 1

## 2021-05-28 NOTE — Progress Notes (Signed)
Occupational Therapy Session Note ? ?Patient Details  ?Name: [name] ?MRN: [id number] ?Date of Birth: October 15, 1960 ? ?Today's Date: 05/28/2021 ?OT Individual Time: 7564-3329 ?OT Individual Time Calculation (min): 31 min  and Today's Date: 05/28/2021 ?OT Missed Time: 30 Minutes ?Missed Time Reason: Patient fatigue;Other (comment) (dizziness) ? ? ?Short Term Goals: ?Week 1:  OT Short Term Goal 1 (Week 1): pt will complete >2 standing grooming task with CGA ?OT Short Term Goal 1 - Progress (Week 1): Met ?OT Short Term Goal 2 (Week 1): Pt will complete ambulatory toilet transfer with min A and LRAD ?OT Short Term Goal 2 - Progress (Week 1): Not met ?OT Short Term Goal 3 (Week 1): Pt will don pants with mod A ?OT Short Term Goal 3 - Progress (Week 1): Not met ?OT Short Term Goal 4 (Week 1): Pt will assist with donning surgical shoe wwith no more than mod A. ?OT Short Term Goal 4 - Progress (Week 1): Not met ?OT Short Term Goal 5 (Week 1): Pt will complete ambulatory toilet transfer with min A and LRAD ?Week 2:  OT Short Term Goal 1 (Week 2): Pt will assist with donning surgical shoe wwith no more than mod A. ?OT Short Term Goal 1 - Progress (Week 2): Met ?OT Short Term Goal 2 (Week 2): Pt will don pants with mod A ?OT Short Term Goal 2 - Progress (Week 2): Met ?OT Short Term Goal 3 (Week 2): Pt will don shirt with S. ?OT Short Term Goal 3 - Progress (Week 2): Met ?Week 3:  OT Short Term Goal 1 (Week 3): STG = LTG 2/2 ELOS ? ?Skilled Therapeutic Interventions/Progress Updates:  ?  Pt received asleep in bed but awakened to touch, agreeable to therapy. Session focus on self-care retraining, activity tolerance, transfer retraining in prep for improved ADL/IADL/func mobility performance + decreased caregiver burden. Requests to use bathroom when prompted. Came to sitting EOB with light max A to progress BLE off bed and to lift trunk, much improved initiation than in previous sessions. Total A to don / doff R darco. Stedy  transfer from elevated height with heavy min A to facilitate upright posture. Stedy transfer > toilet, total A for toileting tasks, pt becoming incontinent/continent of urine prior to sitting down saturating his underwear. But further continent void of b/b. Total A to swap out underwear/pants. Pt reports feeling dizzy and requesting to lay down when prompted. Stedy transfer with heavy min A > bed, mod A to progress BLE onto bed to return to supine. BP after several minutes read at 118/66 (80), HR at 77 bpm. Pt reports feeling better, but still with continued dizziness/fatigued. Closing eyes throughout conversation and need prompting to reply. Session terminated early due to pt dizziness/fatigue, missed 30 min. ? ?Pt left semi-reclined in bed with bed alarm engaged, call bell in reach, and all immediate needs met.  ? ? ?Therapy Documentation ?Precautions:  ?Precautions ?Precautions: Fall ?Precaution Comments: baseline R HP and cognitive deficits ?Restrictions ?Weight Bearing Restrictions: Yes ?RLE Weight Bearing: Partial weight bearing ?Other Position/Activity Restrictions: heel WB with surgical shoe ? ?Pain: denies ?  ?ADL: See Care Tool for more details. ? ?Therapy/Group: Individual Therapy ? ?Volanda Napoleon MS, OTR/L ? ?05/28/2021, 6:50 AM ?

## 2021-05-28 NOTE — Progress Notes (Addendum)
[facility] PHYSICAL MEDICINE & REHABILITATION PROGRESS NOTE ? ?Subjective/Complaints: ? ?Patient reports that he did not sleep all that well.  He does not want to take medication however. ? ?ROS: Limited due to cognitive/behavioral  ? ?Objective: ?Vital Signs: ?Blood pressure 123/73, pulse 81, temperature 99.2 ?F (37.3 ?C), resp. rate 18, height 5\' 7"  (1.702 m), weight 71.2 kg, SpO2 100 %. ?MR FOOT RIGHT WO CONTRAST ? ?Result Date: 05/26/2021 ?CLINICAL DATA:  Bone mass or bone pain, foot, aggressive features on xray EXAM: MRI OF THE RIGHT FOREFOOT WITHOUT CONTRAST TECHNIQUE: Multiplanar, multisequence MR imaging of the right forefoot was performed. No intravenous contrast was administered. COMPARISON:  Small radiograph 05/25/2021, right foot MRI 04/27/2021 FINDINGS: Bones/Joint/Cartilage Prior great toe amputation and recent first metatarsal head resection. There is marrow edema within the residual first metatarsal, with low T1 signal at the distal resection margin. Prior second toe amputation. Unchanged appearance of the second metatarsal head with small erosions on the margins, and otherwise no significant marrow edema or low T1 signal. There is no other significant marrow signal alteration in the forefoot. Ligaments Intact Lisfranc ligament. Muscles and Tendons Diffuse intramuscular edema muscle atrophy in the foot as is commonly seen in diabetics. There is a collection of fluid along the expected course of the adductor hallucis tendon, which is likely tenosynovial fluid related to the previous first toe amputation (series 7, image 15). Soft tissues Diffuse soft tissue swelling of the foot. There is no definite drainable collection underlying the amputation site. IMPRESSION: Prior great toe amputation and recent first metatarsal head resection. There is marrow edema and some low T1 signal at the distal resection margin in the residual first metatarsal, favored to represent postsurgical changes due to recent  amputation, though developing osteomyelitis could have a similar appearance. Prior second toe amputation. Unchanged appearance of the second metatarsal head without evidence of osteomyelitis. Fluid collecting along the expected course of the adductor hallucis tendon, likely reactive tenosynovial fluid related to the recent amputation. Forefoot soft tissue swelling without other evidence of well-defined/drainable fluid collection underlying the amputation site. Electronically Signed   By: Maurine Simmering M.D.   On: 05/26/2021 10:12   ?No results for input(s): WBC, HGB, HCT, PLT in the last 72 hours. ? ?No results for input(s): NA, K, CL, CO2, GLUCOSE, BUN, CREATININE, CALCIUM in the last 72 hours. ? ?Intake/Output Summary (Last 24 hours) at 05/28/2021 0904 ?Last data filed at 05/27/2021 1530 ?Gross per 24 hour  ?Intake 240 ml  ?Output 600 ml  ?Net -360 ml  ?  ? ?  ? ?Physical Exam: ?BP 123/73 (BP Location: Right Arm)   Pulse 81   Temp 99.2 ?F (37.3 ?C)   Resp 18   Ht 5\' 7"  (1.702 m)   Wt 71.2 kg   SpO2 100%   BMI 24.58 kg/m?  ?Constitutional: No distress . Vital signs reviewed. ?HEENT: NCAT, EOMI, oral membranes moist ?Neck: supple ?Cardiovascular: RRR without murmur. No JVD    ?Respiratory/Chest: CTA Bilaterally without wheezes or rales. Normal effort    ?GI/Abdomen: BS +, non-tender, non-distended ?Ext: no clubbing, cyanosis, or edema ?Psych: flat but cooperative  ? ?4/24 ? ? ? ? ?5/6-stable appearance-   ?  ?Neuro: 4-/5 throughout, speech dysarthric ?Hand intrinsic atrophy with reduced sensation in 5th fingers bilateral , also reduced sensation feet to LT , ? ?Assessment/Plan: ?1. Functional deficits which require 3+ hours per day of interdisciplinary therapy in a comprehensive inpatient rehab setting. ?Physiatrist is providing close team supervision  and 24 hour management of active medical problems listed below. ?Physiatrist and rehab team continue to assess barriers to discharge/monitor patient progress toward  functional and medical goals ? ? ?Care Tool: ? ?Bathing ?   ?Body parts bathed by patient: Face, Chest  ? Body parts bathed by helper: Right lower leg, Left lower leg, Buttocks, Right arm, Left arm, Abdomen, Left upper leg ?  ?  ?Bathing assist Assist Level: Total Assistance - Patient < 25% ?  ?  ?Upper Body Dressing/Undressing ?Upper body dressing   ?What is the patient wearing?: Pull over shirt ?   ?Upper body assist Assist Level: Maximal Assistance - Patient 25 - 49% ?   ?Lower Body Dressing/Undressing ?Lower body dressing ? ? ?   ?What is the patient wearing?: Pants ? ?  ? ?Lower body assist Assist for lower body dressing: Total Assistance - Patient < 25% ?   ? ?Toileting ?Toileting Toileting Activity did not occur (Probation officer and hygiene only): N/A (no void or bm)  ?Toileting assist Assist for toileting: Maximal Assistance - Patient 25 - 49% ?  ?  ?Transfers ?Chair/bed transfer ? ?Transfers assist ?   ? ?Chair/bed transfer assist level: Dependent - mechanical lift (stedy) ?  ?  ?Locomotion ?Ambulation ? ? ?Ambulation assist ? ?   ? ?Assist level: Minimal Assistance - Patient > 75% ?Assistive device: Walker-rolling ?Max distance: 120 ft  ? ?Walk 10 feet activity ? ? ?Assist ?   ? ?Assist level: Minimal Assistance - Patient > 75% ?Assistive device: Walker-rolling  ? ?Walk 50 feet activity ? ? ?Assist Walk 50 feet with 2 turns activity did not occur: Safety/medical concerns ? ?Assist level: Minimal Assistance - Patient > 75% ?Assistive device: Walker-rolling  ? ? ?Walk 150 feet activity ? ? ?Assist Walk 150 feet activity did not occur: Safety/medical concerns ? ?  ?  ?  ? ?Walk 10 feet on uneven surface  ?activity ? ? ?Assist Walk 10 feet on uneven surfaces activity did not occur: Safety/medical concerns ? ? ?  ?   ? ?Wheelchair ? ? ? ? ?Assist Is the patient using a wheelchair?: Yes ?Type of Wheelchair: Manual ?  ? ?Wheelchair assist level: Minimal Assistance - Patient > 75% ?Max wheelchair distance: 150   ? ? ?Wheelchair 50 feet with 2 turns activity ? ? ? ?Assist ? ?  ?  ? ? ?Assist Level: Minimal Assistance - Patient > 75%  ? ?Wheelchair 150 feet activity  ? ? ? ?Assist ?   ? ? ?Assist Level: Minimal Assistance - Patient > 75%  ? ? ?Medical Problem List and Plan: ?1. Functional deficits secondary to peripheral arterial disease, RIght 1st ray, 2,3,4 toe amps due to osteomyelitis with pop art stenting Right side surgery and acute pulmonary embolus. ?Continue CIR PT, OT - ELOS 5/3- will need to extend to 5/6 due to UTI with decline in fxn  ?Stable for d/c medically but now at max A x 1 (improved from max A x 2 earlier this week)  ??SNF, family training Monday  ? ?2.  Antithrombotics: ?-DVT/anticoagulation:  Pharmaceutical: had PE now on Eliquis- no sign of surgical site bleeding  ?            -antiplatelet therapy: Aspirin 81 mg daily ?3. Pain Management: Tylenol, tramadol as needed ? Monitor with increased exertion ?4. Onchomycosis of left toes: antifungal cream ordered BID.  ?5. Neuropsych: This patient is capable of making decisions on his own behalf. ? -encouraged patient  to ask his nurse for something for sleep if needed ? -We will add melatonin scheduled in addition to as needed trazodone ?6. Right foot incision: ?            -- Keep wound clean and dry ?7. Fluids/Electrolytes/Nutrition: Routine I's and O's ?            -- Carb modified diet ?8: PAD: Status post Right popliteal stent placement ?            -- Continue aspirin while on Eliquis.  Stop Plavix. ?            -- Follow-up with Dr. Lucky Cowboy with ABIs ?            -- Status post right partial first ray amputation  ?            -- Right heel weightbearing with postop shoe- trial of darco sandal PT evaluating use  ?   ?9: prior CVA: ?10: Dyslipidemia: Continue Lipitor 40 mg daily ?11: Acute PE: Continue Eliquis ?12: Hypertension: Continue Norvasc 10 mg daily ?Vitals:  ? 05/27/21 1945 05/28/21 0436  ?BP: 130/74 123/73  ?Pulse: 76 81  ?Resp: 14 18  ?Temp: 98 ?F  (36.7 ?C) 99.2 ?F (37.3 ?C)  ?SpO2: 100% 100%  ? Controlled 5/6  ? ?13: DM type II with peripheral neuropathy  Increase metformin to 500mg  BID  ? CBG (last 3) will order , pt states he has no CBG monitor at home

## 2021-05-28 NOTE — Progress Notes (Signed)
Physical Therapy Session Note ? ?Patient Details  ?Name: [name] ?MRN: [id number] ?Date of Birth: [birth year] ? ?Today's Date: 05/28/2021 ?PT Individual Time: 2780-0447 ?PT Individual Time Calculation (min): 56 min  ? ?Short Term Goals: ?Week 4:  PT Short Term Goal 1 (Week 4): STG=LTG due to ELOS ? ?Skilled Therapeutic Interventions/Progress Updates:  ? ?Pt received supine in bed and agreeable to PT. Supine>sit transfer with mod-max assist and cues for log roll sequencing.  ? ?Sitting balance EOB while donning pants and shoes with supervision assist fading to mod assist to prevent anterior LOB x 10 minutes. Sit<>stand form EOB with mod-max assist for PT to assist with donning pants and underpants.  ? ?Stand pivot transfer to [facility] with mod assist and max cues for safety in turn. Pt reports mild dizziness in standing. BP assessed in sitting 113/69. HR 85. Sit<>stand performed x 2 then BP reassessed with no change 111/72. Pt performed ambulatory transfer to [facility] with mod assist and cues for step length on BLE to prevent foot drag.  ? ?Sit>supine with mod assist at BLE and max cues for sequencing of log roll, but very poor adherence to instruction from PT. Pt left inbed with call bell in reach and all needs met.  ? ?   ? ?Therapy Documentation ?Precautions:  ?Precautions ?Precautions: Fall ?Precaution Comments: baseline R HP and cognitive deficits ?Restrictions ?Weight Bearing Restrictions: Yes ?RLE Weight Bearing: Partial weight bearing ?Other Position/Activity Restrictions: heel WB with surgical shoe ? ?  ?Pain: ?denies ? ? ? ?Therapy/Group: Individual Therapy ? ?[name] ?05/28/2021, 9:06 AM  ?

## 2021-05-29 LAB — CULTURE, BLOOD (ROUTINE X 2)
Culture: NO GROWTH
Culture: NO GROWTH
Special Requests: ADEQUATE
Special Requests: ADEQUATE

## 2021-05-29 LAB — GLUCOSE, CAPILLARY
Glucose-Capillary: 95 mg/dL (ref 70–99)
Glucose-Capillary: 154 mg/dL — ABNORMAL HIGH (ref 70–99)
Glucose-Capillary: 118 mg/dL — ABNORMAL HIGH (ref 70–99)
Glucose-Capillary: 115 mg/dL — ABNORMAL HIGH (ref 70–99)

## 2021-05-29 NOTE — Progress Notes (Signed)
Speech Language Pathology Daily Session Note ? ?Patient Details  ?Name: [name] ?MRN: [id number] ?Date of Birth: [birth year] ? ?Today's Date: 05/29/2021 ?SLP Individual Time: 0900-1000 ?SLP Individual Time Calculation (min): 60 min ? ?Short Term Goals: ?Week 2: SLP Short Term Goal 1 (Week 2): STG=LTG due to ELOS ? ?Skilled Therapeutic Interventions: ?  Skilled ST treatment focused on cognitive goals. Pt was laying in bed, asleep, prior to SLP entry. Pt was oriented x4 using calendar. SLP facilitated error awareness, recall, and sustained attention during therapeutic task BLINK. Pt required modA fading to minA towards end of task. SLP also targeted sequencing this date. Pt was able to sequence 4-steps given modA if requiring additional reasoning (Month of Thanksgiving, Month of Labor day etc.) vs. minA with basic tasks (laundry, wrapping a present etc.). Pt was left in bed with alarm activated, immediate needs within reach, and bed alarm activated. Continue with current POC.  ? ?Pain ?Pain Assessment ?Pain Scale: 0-10 ?Pain Score: 2  ?Pain Location: Buttocks ?Pain Descriptors / Indicators: Discomfort ?Pain Onset: Unable to tell ?Pain Intervention(s): Repositioned ? ?Therapy/Group: Individual Therapy ? ?[place] ?05/29/2021, 12:44 PM ?

## 2021-05-29 NOTE — Progress Notes (Signed)
[place] PHYSICAL MEDICINE & REHABILITATION PROGRESS NOTE ? ?Subjective/Complaints: ? ?Perhaps slept a little better. Appears comfortable ? ?ROS: Limited due to cognitive/behavioral  ? ?Objective: ?Vital Signs: ?Blood pressure 118/69, pulse 78, temperature 98.4 ?F (36.9 ?C), resp. rate 16, height 5\' 7"  (1.702 m), weight 71.2 kg, SpO2 100 %. ?No results found. ?No results for input(s): WBC, HGB, HCT, PLT in the last 72 hours. ? ?No results for input(s): NA, K, CL, CO2, GLUCOSE, BUN, CREATININE, CALCIUM in the last 72 hours. ? ?Intake/Output Summary (Last 24 hours) at 05/29/2021 0824 ?Last data filed at 05/28/2021 1757 ?Gross per 24 hour  ?Intake 480 ml  ?Output --  ?Net 480 ml  ?  ? ?Pressure Injury 05/28/21 Buttocks Stage 2 -  Partial thickness loss of dermis presenting as a shallow open injury with a red, pink wound bed without slough. (Active)  ?05/28/21 1837  ?Location: Buttocks  ?Location Orientation:   ?Staging: Stage 2 -  Partial thickness loss of dermis presenting as a shallow open injury with a red, pink wound bed without slough.  ?Wound Description (Comments):   ?Present on Admission: -- (unknown)  ?Dressing Type Foam - Lift dressing to assess site every shift 05/28/21 1950  ? ? ?Physical Exam: ?BP 118/69 (BP Location: Right Arm)   Pulse 78   Temp 98.4 ?F (36.9 ?C)   Resp 16   Ht 5\' 7"  (1.702 m)   Wt 71.2 kg   SpO2 100%   BMI 24.58 kg/m?  ?Constitutional: No distress . Vital signs reviewed. ?HEENT: NCAT, EOMI, oral membranes moist ?Neck: supple ?Cardiovascular: RRR without murmur. No JVD    ?Respiratory/Chest: CTA Bilaterally without wheezes or rales. Normal effort    ?GI/Abdomen: BS +, non-tender, non-distended ?Ext: no clubbing, cyanosis, or edema ?Psych:flat but  cooperative  ? ?4/24 ? ? ? ? ?5/7-stable appearance-  to above ?  ?Neuro: 4-/5 throughout, speech very dysarthric ?Hand intrinsic atrophy with reduced sensation in 5th fingers bilateral , also reduced sensation feet to LT  , ? ?Assessment/Plan: ?1. Functional deficits which require 3+ hours per day of interdisciplinary therapy in a comprehensive inpatient rehab setting. ?Physiatrist is providing close team supervision and 24 hour management of active medical problems listed below. ?Physiatrist and rehab team continue to assess barriers to discharge/monitor patient progress toward functional and medical goals ? ? ?Care Tool: ? ?Bathing ?   ?Body parts bathed by patient: Face, Chest  ? Body parts bathed by helper: Right lower leg, Left lower leg, Buttocks, Right arm, Left arm, Abdomen, Left upper leg ?  ?  ?Bathing assist Assist Level: Total Assistance - Patient < 25% ?  ?  ?Upper Body Dressing/Undressing ?Upper body dressing   ?What is the patient wearing?: Pull over shirt ?   ?Upper body assist Assist Level: Maximal Assistance - Patient 25 - 49% ?   ?Lower Body Dressing/Undressing ?Lower body dressing ? ? ?   ?What is the patient wearing?: Pants ? ?  ? ?Lower body assist Assist for lower body dressing: Total Assistance - Patient < 25% ?   ? ?Toileting ?Toileting Toileting Activity did not occur (Probation officer and hygiene only): N/A (no void or bm)  ?Toileting assist Assist for toileting: Maximal Assistance - Patient 25 - 49% ?  ?  ?Transfers ?Chair/bed transfer ? ?Transfers assist ?   ? ?Chair/bed transfer assist level: Dependent - mechanical lift (stedy) ?  ?  ?Locomotion ?Ambulation ? ? ?Ambulation assist ? ?   ? ?Assist level: Minimal  Assistance - Patient > 75% ?Assistive device: Walker-rolling ?Max distance: 120 ft  ? ?Walk 10 feet activity ? ? ?Assist ?   ? ?Assist level: Minimal Assistance - Patient > 75% ?Assistive device: Walker-rolling  ? ?Walk 50 feet activity ? ? ?Assist Walk 50 feet with 2 turns activity did not occur: Safety/medical concerns ? ?Assist level: Minimal Assistance - Patient > 75% ?Assistive device: Walker-rolling  ? ? ?Walk 150 feet activity ? ? ?Assist Walk 150 feet activity did not occur:  Safety/medical concerns ? ?  ?  ?  ? ?Walk 10 feet on uneven surface  ?activity ? ? ?Assist Walk 10 feet on uneven surfaces activity did not occur: Safety/medical concerns ? ? ?  ?   ? ?Wheelchair ? ? ? ? ?Assist Is the patient using a wheelchair?: Yes ?Type of Wheelchair: Manual ?  ? ?Wheelchair assist level: Minimal Assistance - Patient > 75% ?Max wheelchair distance: 150  ? ? ?Wheelchair 50 feet with 2 turns activity ? ? ? ?Assist ? ?  ?  ? ? ?Assist Level: Minimal Assistance - Patient > 75%  ? ?Wheelchair 150 feet activity  ? ? ? ?Assist ?   ? ? ?Assist Level: Minimal Assistance - Patient > 75%  ? ? ?Medical Problem List and Plan: ?1. Functional deficits secondary to peripheral arterial disease, RIght 1st ray, 2,3,4 toe amps due to osteomyelitis with pop art stenting Right side surgery and acute pulmonary embolus. ?Continue CIR PT, OT - Stable for d/c medically but now at max A x 1 (improved from max A x 2 earlier this week)  ??SNF, family training Monday  ? ?2.  Antithrombotics: ?-DVT/anticoagulation:  Pharmaceutical: had PE now on Eliquis- no sign of surgical site bleeding  ?            -antiplatelet therapy: Aspirin 81 mg daily ?3. Pain Management: Tylenol, tramadol as needed ? Monitor with increased exertion ?4. Onchomycosis of left toes: antifungal cream ordered BID.  ?5. Neuropsych: This patient is capable of making decisions on his own behalf. ? -encouraged patient to ask his nurse for something for sleep if needed ? -5/7 continue scheduled melatonin scheduled in addition to as needed trazodone ?6. Right foot incision: ?            -- Keep wound clean and dry ?7. Fluids/Electrolytes/Nutrition: Routine I's and O's ?            -- Carb modified diet ?8: PAD: Status post Right popliteal stent placement ?            -- Continue aspirin while on Eliquis.  Stop Plavix. ?            -- Follow-up with Dr. Lucky Cowboy with ABIs ?            -- Status post right partial first ray amputation  ?            -- Right heel  weightbearing with postop shoe- trial of darco sandal PT evaluating use  ?   ?9: prior CVA: ?10: Dyslipidemia: Continue Lipitor 40 mg daily ?11: Acute PE: Continue Eliquis ?12: Hypertension: Continue Norvasc 10 mg daily ?Vitals:  ? 05/28/21 1947 05/29/21 0558  ?BP: 124/74 118/69  ?Pulse: 78 78  ?Resp: 17 16  ?Temp: 98.7 ?F (37.1 ?C) 98.4 ?F (36.9 ?C)  ?SpO2: 100% 100%  ? Controlled 5/7 ? ?13: DM type II with peripheral neuropathy  Increase metformin to 500mg  BID  ? CBG (last 3)  will order , pt states he has no CBG monitor at home  ?Recent Labs  ?  05/28/21 ?1658 05/28/21 ?2121 05/29/21 ?0621  ?GLUCAP 73 180* 95  ?CBG improved 5/7  ? ?14: Seizure disorder maintained on Keppra: Continue 500 mg daily, ? If PCP managed this in past  ?15: Constipation: give sorbitol times once dose now ?            --schedule senna BID, LBM 4/24, give sorbitol ?16: Urinary retention: d/c foley ?           no cath x 48h , cont flomax and urecholine  ?17: Chronic diastolic congestive heart failure, euvolemic, no diuretics ?            -- Grade 1 diastolic dysfunction on recent echo ?18.  Hyponatremia ? Na improved to 134, monitor weekly ?19.  Bradykinesia , masked facies, min cogwheeling, no hx PD , no showed for appt with  Dr Jaynee Eagles- would rec f/u post hospitalization   ?Hx of remote RIght subcortical infarcts which may cause a parkinson like syndrome  , seizure d/o ?20.  Fever up to 103 F 5/2 , +UTI, neg cx result  but was on keflex x 2 d when cx taken , abx regimen modified with addition of vanc to cover gm + , No clinical evidence of PNA or wound infection  ?Xray foot showing osseous irregularity , MRI neg for infection ,  ?ID has signed off ? -5/7 afebrile ? ?LOS: 23 days ?A FACE TO FACE EVALUATION WAS PERFORMED ? ?Meredith Staggers ?05/29/2021, 8:24 AM ? ?

## 2021-05-30 LAB — GLUCOSE, CAPILLARY
Glucose-Capillary: 96 mg/dL (ref 70–99)
Glucose-Capillary: 117 mg/dL — ABNORMAL HIGH (ref 70–99)
Glucose-Capillary: 144 mg/dL — ABNORMAL HIGH (ref 70–99)
Glucose-Capillary: 141 mg/dL — ABNORMAL HIGH (ref 70–99)

## 2021-05-30 LAB — BASIC METABOLIC PANEL
Anion gap: 5 (ref 5–15)
BUN: 19 mg/dL (ref 6–20)
CO2: 25 mmol/L (ref 22–32)
Calcium: 8.7 mg/dL — ABNORMAL LOW (ref 8.9–10.3)
Chloride: 103 mmol/L (ref 98–111)
Creatinine, Ser: 0.81 mg/dL (ref 0.61–1.24)
GFR, Estimated: >60 mL/min (ref >60)
Glucose, Bld: 83 mg/dL (ref 70–99)
Potassium: 3.5 mmol/L (ref 3.5–5.1)
Sodium: 133 mmol/L — ABNORMAL LOW (ref 135–145)

## 2021-05-30 NOTE — Progress Notes (Signed)
Physical Therapy Session Note ? ?Patient Details  ?Name: [name] ?MRN: [id number] ?Date of Birth: 10-Jun-1960 ? ?Today's Date: 05/30/2021 ?PT Individual Time: 0177-9390 and 1516-1600 ?PT Individual Time Calculation (min): 59 min and 44 min ? ?Short Term Goals: ?Week 2:  PT Short Term Goal 1 (Week 2): Pt will ambulate 177f with CGA and LRAD ?PT Short Term Goal 1 - Progress (Week 2): Met ?PT Short Term Goal 2 (Week 2): pt will ascend  8 steps with min assist consistently ?PT Short Term Goal 2 - Progress (Week 2): Progressing toward goal ?PT Short Term Goal 3 (Week 2): [facility] ?PT Short Term Goal 3 - Progress (Week 2): Met ?PT Short Term Goal 4 (Week 2): Pt will propell WC 1077fwith supervision assist ?PT Short Term Goal 4 - Progress (Week 2): Met ?Week 3:  PT Short Term Goal 1 (Week 3): STG=LTG due to ELOS ?PT Short Term Goal 1 - Progress (Week 3): Progressing toward goal ? ?Skilled Therapeutic Interventions/Progress Updates:  ?Session 1: ?Patient supine in bed on entrance to room. Patient alert and agreeable to PT session.  ? ?Patient with no pain complaint throughout session. ? ?Therapeutic Activity: ?Bed Mobility: Patient performed supine --> sit with pt initiating and requiring extra time to complete. Is able to bring BLE OOB then requires consistent vc/ tc for hand placement and effort to push from bed surface and bring UB to seated position with ModA. Lean to R side throughout sitting and requires consistent minA and vc to maintain midline. Unable to adjust BLE positioning for improved balance without ModA . Requires ModA to don new t-shirt. ?Transfers: Patient performed sit<>stand at EOB in order to don pants with pt requiring extra time and ModA to complete rise to stand. VC for effort, reaching full upright stance, hand placement. Attempts to pull pants up but requires ModA to complete. Toilet transfer using safety rail with CGA. Requires MaxA for clothing  mgmt and changing brief. With availability of safety rail/ bed rail for UE support, pt is able to perform squat pivot transfers with CGA/ MinA.  ? ?Gait Training:  ?Patient ambulated 20 ft x1/ 12 ft x1/ 5 ft x1 using RW with CGA/ MinA. Demonstrated flexed posture with very slow pace. Provided vc/ tc for walker mgmt. ? ?Neuromuscular Re-ed: ?NMR facilitated during session with focus on sitting balance. Pt guided in midline orientation, proprioception activities in attempt for pt to perform self corrections. Recognizes need for adjustment to maintain balance, however extremely slow initiation to correct. No immediate refex to perturbations. NMR performed for improvements in motor control and coordination, balance, sequencing, judgement, and self confidence/ efficacy in performing all aspects of mobility at highest level of independence.  ? ?Patient seated  in recliner at end of session with brakes locked, belt alarm set, and all needs within reach. ? ? ?Session 2: ?Patient seated upright in recliner on entrance to room. Patient alert and agreeable to PT session. Pt's brother and two sisters are present in room.  ? ?Patient with no pain complaint throughout session. ? ?Family with questions re: setback from UTI, CLOF, progress. Answered questions and related pt's significant regression d/t infection and slow progression. Recommendation made for SNF placement for continued short term rehab. Explained difference  between short term rehab placement vs nursing home placement. Family interested in pt staying in inpatient rehab and SW contacted to provide more detailed information re: insurance benefits and choosing SNF placement  closer to home.  ? ?Therapeutic Activity: ?Transfers: Patient performed sit<>stand and stand pivot transfers throughout session with [place] for power up and requires consistent verbal cues for attaining more forward lean and balance. Continued posterior bias requiring forward positioning of RW to  bring weight forward. ? ?Gait Training:  ?Patient ambulated 20 ft using RW with MinA/ CGA. Demonstrated flexed posture and flexed knees throughout. Provided vc/ tc for balance and completing turn prior to sit in chair. ? ?Patient seated upright in recliner at end of session with brakes locked, belt alarm set, and all needs within reach. ? ? ?Therapy Documentation ?Precautions:  ?Precautions ?Precautions: Fall ?Precaution Comments: baseline R HP and cognitive deficits ?Restrictions ?Weight Bearing Restrictions: Yes ?RLE Weight Bearing: Partial weight bearing ?Other Position/Activity Restrictions: heel WB with surgical shoe ?General: ?  ?Vital Signs: ?  ?Pain: ?Pain Assessment ?Pain Scale: 0-10 ?Pain Score: 0-No pain ? ?Therapy/Group: Individual Therapy ? ?Alger Simons PT, DPT ?05/30/2021, 12:50 PM  ?

## 2021-05-30 NOTE — Progress Notes (Incomplete)
This nurse spoke with pt, pts two sisters and brother at this time regarding any questions or concerns they might have on pt care. Discussed progress from nursing standpoint, medication list, and wound care. They all voiced understanding and talked about  ?

## 2021-05-30 NOTE — Progress Notes (Signed)
[place] PHYSICAL MEDICINE & REHABILITATION PROGRESS NOTE ? ?Subjective/Complaints: ? ?Off abx afebrile last PT note at Mod/Max A , discussed home vs SNF based on family ability to assist with mobility needs  ? ?ROS: Denies CP, SOB, N/V/D ? ? ?Objective: ?Vital Signs: ?Blood pressure 108/70, pulse 71, temperature 98.5 ?F (36.9 ?C), temperature source Oral, resp. rate 16, height 5\' 7"  (1.702 m), weight 71.2 kg, SpO2 100 %. ?No results found. ?No results for input(s): WBC, HGB, HCT, PLT in the last 72 hours. ? ? ?Recent Labs  ?  05/30/21 ?6073  ?NA 133*  ?K 3.5  ?CL 103  ?CO2 25  ?GLUCOSE 83  ?BUN 19  ?CREATININE 0.81  ?CALCIUM 8.7*  ? ? ? ?Intake/Output Summary (Last 24 hours) at 05/30/2021 0735 ?Last data filed at 05/29/2021 1442 ?Gross per 24 hour  ?Intake 277 ml  ?Output --  ?Net 277 ml  ? ?  ? ?Pressure Injury 05/28/21 Buttocks Stage 2 -  Partial thickness loss of dermis presenting as a shallow open injury with a red, pink wound bed without slough. (Active)  ?05/28/21 1837  ?Location: Buttocks  ?Location Orientation:   ?Staging: Stage 2 -  Partial thickness loss of dermis presenting as a shallow open injury with a red, pink wound bed without slough.  ?Wound Description (Comments):   ?Present on Admission: -- (unknown)  ?Dressing Type Foam - Lift dressing to assess site every shift 05/29/21 2300  ? ? ?Physical Exam: ?BP 108/70 (BP Location: Left Arm)   Pulse 71   Temp 98.5 ?F (36.9 ?C) (Oral)   Resp 16   Ht 5\' 7"  (1.702 m)   Wt 71.2 kg   SpO2 100%   BMI 24.58 kg/m?  ?Gen: no distress, normal appearing ?HEENT: oral mucosa pink and moist, NCAT ?Cardio: Reg rate ?Chest: normal effort, normal rate of breathing ?Abd: soft, non-distended ?Ext: no edema ?Psych: pleasant, normal affect ?Oriented to person and place  ? ?4/24 ? ? ? ? ? ?Looks identical on 5/8 ?Psych: no lability agitation  ?  ?Neuro: 4-/5 throughout,  ? ?Hand intrinsic atrophy with reduced sensation in 5th fingers bilateral , also reduced sensation  feet to LT , ? ?Assessment/Plan: ?1. Functional deficits which require 3+ hours per day of interdisciplinary therapy in a comprehensive inpatient rehab setting. ?Physiatrist is providing close team supervision and 24 hour management of active medical problems listed below. ?Physiatrist and rehab team continue to assess barriers to discharge/monitor patient progress toward functional and medical goals ? ? ?Care Tool: ? ?Bathing ?   ?Body parts bathed by patient: Face, Chest  ? Body parts bathed by helper: Right lower leg, Left lower leg, Buttocks, Right arm, Left arm, Abdomen, Left upper leg ?  ?  ?Bathing assist Assist Level: Total Assistance - Patient < 25% ?  ?  ?Upper Body Dressing/Undressing ?Upper body dressing   ?What is the patient wearing?: Pull over shirt ?   ?Upper body assist Assist Level: Maximal Assistance - Patient 25 - 49% ?   ?Lower Body Dressing/Undressing ?Lower body dressing ? ? ?   ?What is the patient wearing?: Pants ? ?  ? ?Lower body assist Assist for lower body dressing: Total Assistance - Patient < 25% ?   ? ?Toileting ?Toileting Toileting Activity did not occur (Probation officer and hygiene only): N/A (no void or bm)  ?Toileting assist Assist for toileting: Maximal Assistance - Patient 25 - 49% ?  ?  ?Transfers ?Chair/bed transfer ? ?Transfers assist ?   ? ?  Chair/bed transfer assist level: Dependent - mechanical lift (stedy) ?  ?  ?Locomotion ?Ambulation ? ? ?Ambulation assist ? ?   ? ?Assist level: Minimal Assistance - Patient > 75% ?Assistive device: Walker-rolling ?Max distance: 120 ft  ? ?Walk 10 feet activity ? ? ?Assist ?   ? ?Assist level: Minimal Assistance - Patient > 75% ?Assistive device: Walker-rolling  ? ?Walk 50 feet activity ? ? ?Assist Walk 50 feet with 2 turns activity did not occur: Safety/medical concerns ? ?Assist level: Minimal Assistance - Patient > 75% ?Assistive device: Walker-rolling  ? ? ?Walk 150 feet activity ? ? ?Assist Walk 150 feet activity did not occur:  Safety/medical concerns ? ?  ?  ?  ? ?Walk 10 feet on uneven surface  ?activity ? ? ?Assist Walk 10 feet on uneven surfaces activity did not occur: Safety/medical concerns ? ? ?  ?   ? ?Wheelchair ? ? ? ? ?Assist Is the patient using a wheelchair?: Yes ?Type of Wheelchair: Manual ?  ? ?Wheelchair assist level: Minimal Assistance - Patient > 75% ?Max wheelchair distance: 150  ? ? ?Wheelchair 50 feet with 2 turns activity ? ? ? ?Assist ? ?  ?  ? ? ?Assist Level: Minimal Assistance - Patient > 75%  ? ?Wheelchair 150 feet activity  ? ? ? ?Assist ?   ? ? ?Assist Level: Minimal Assistance - Patient > 75%  ? ? ?Medical Problem List and Plan: ?1. Functional deficits secondary to peripheral arterial disease, RIght 1st ray, 2,3,4 toe amps due to osteomyelitis with pop art stenting Right side surgery and acute pulmonary embolus. ?Continue CIR PT, OT - ELOS 5/3- will need to extend to 5/6 due to UTI with decline in fxn  ?Stable for d/c medically but now at mod/ max A x 1 (improved from max A x 2 earlier this week)  ?Expect slow progress with reconditioning, rec SNF unless family able to handle mod A level , family training Monday  ? ?2.  Antithrombotics: ?-DVT/anticoagulation:  Pharmaceutical: had PE now on Eliquis- no sign of surgical site bleeding  ?            -antiplatelet therapy: Aspirin 81 mg daily ?3. Pain Management: Tylenol, tramadol as needed ? Monitor with increased exertion ?4. Onchomycosis of left toes: antifungal cream ordered BID.  ?5. Neuropsych: This patient is capable of making decisions on his own behalf. ?6. Right foot incision: ?            --Daily dry dressing change ?7. Fluids/Electrolytes/Nutrition: Routine I's and O's ?            -- Carb modified diet ?8: PAD: Status post Right popliteal stent placement ?            -- Continue aspirin while on Eliquis.  Stop Plavix. ?            -- Follow-up with Dr. Lucky Cowboy with ABIs ?            -- Status post right partial first ray amputation  ?            -- Right  heel weightbearing with postop shoe- trial of darco sandal PT evaluating use  ?No swelling no  ?9: prior CVA: ?10: Dyslipidemia: Continue Lipitor 40 mg daily ?11: Acute PE: Continue Eliquis ?12: Hypertension: Continue Norvasc 10 mg daily ?Vitals:  ? 05/29/21 1920 05/30/21 0340  ?BP: 122/75 108/70  ?Pulse: 73 71  ?Resp: 14 16  ?Temp: 98.6 ?F (  37 ?C) 98.5 ?F (36.9 ?C)  ?SpO2: 100% 100%  ? Controlled 5/8 ? ?13: DM type II with peripheral neuropathy  Increase metformin to 500mg  BID  ? CBG (last 3) will order , pt states he has no CBG monitor at home  ?Recent Labs  ?  05/29/21 ?1716 05/29/21 ?2049 05/30/21 ?0768  ?GLUCAP 118* 115* 96  ? ?CBG improving  ? ?14: Seizure disorder maintained on Keppra: Continue 500 mg daily, ? If PCP managed this in past  ?15: Constipation: last BM 5/6 ?         ?16: Urinary retention: d/c foley ?           no cath since 5/5 , cont flomax and urecholine  ?17: Chronic diastolic congestive heart failure, euvolemic, no diuretics ?            -- Grade 1 diastolic dysfunction on recent echo ?18.  Hyponatremia ? Na improved to 134, monitor weekly ?19.  Bradykinesia , masked facies, min cogwheeling, no hx PD , no showed for appt with  Dr Jaynee Eagles- would rec f/u post hospitalization   ?Hx of remote RIght subcortical infarcts which may cause a parkinson like syndrome  , seizure d/o ?20.  Fever up to 103 F 5/2 ,  neg cx result  but was on keflex x 2 d when cx taken , abx regimen modified with addition of vanc to cover gm + , No clinical evidence of PNA or wound infection  ?Xray foot showing osseous irregularity , MRI neg for infection ,  ?ID has signed off ?21.  UTI resolved  ?LOS: 24 days ?A FACE TO FACE EVALUATION WAS PERFORMED ? ?Luanna Salk Suren Payne ?05/30/2021, 7:35 AM ? ?

## 2021-05-30 NOTE — Progress Notes (Signed)
Speech Language Pathology Daily Session Note ? ?Patient Details  ?Name: [name] ?MRN: [id number] ?Date of Birth: [birth year] ? ?Today's Date: 05/30/2021 ?SLP Individual Time: 1275-1700 ?SLP Individual Time Calculation (min): 45 min ? ?Short Term Goals: ?Week 2: SLP Short Term Goal 1 (Week 2): STG=LTG due to ELOS ? ?Skilled Therapeutic Interventions: Skilled ST treatment focused on cognitive goals. Per SW note dated 5/05, pt's sister confirmed that she would be able to come for family education scheduled this afternoon. Family was not present during session and pt stated he hasn't heard from her today. SLP proceeded with session. SLP administered the [facility] Mental Status Examination (SLUMS) and patient scored 14/30 points with a score of 25 or above considered within normal range based on pt's educational history. Scores are suggestive of improvement as compared to initial score of 10/30 obtained at eval on 05/12/21. Pt continues to exhibit deficits in attention, memory, problem solving, and intellectual awareness. Pt exhibited increased impulsivity today. Earlier today, pt's chair alarm was going off and patient was attempting to get out his recliner chair. SLP re-activated chair alarm and provided verbal explanation that assistance is required for all transfers. SLP prompted pt to press call bell to request assistance. Nursing soon provided assistance for transfer to bed. During SLP session, pt requested to transfer from bed to recliner chair. Pt initially declined assistance and stated "I can do it myself" and "I don't need no help." Pt had minimal awareness of physical deficits or precautions that currently impact safety requiring max A to identify needs with field of choices. SLP and nurse facilitated transfer using Stedy and mod A to stand. Patient was left in recliner with alarm activated and immediate needs within reach at end of session. Continue per current plan of care.   ?   ?Pain ?Pain  Assessment ?Pain Scale: 0-10 ?Pain Score: 0-No pain ? ?Therapy/Group: Individual Therapy ? ?Eliseo Withers T Alvester Eads ?05/30/2021, 1:11 PM ?

## 2021-05-30 NOTE — Progress Notes (Signed)
Patient ID: [name], male   DOB: 10-06-1960, 61 y.o.   MRN: [id number] ? ?This SW covering for primary SW, Erlene Quan. ? ?SW went in to meet with pt to speak with family about progress and d/c decision at this time. Family not present. SW briefly discussed short term rehab in SNF. Family arrived while SW speaking with pt. Family will be here for remaining session. SW informed will come back in to discuss discharge plan.  ? ?SW met with pt and pt family in room to discuss family education. Family was interested in options being here in CIR. SW shared due to slow progression, SNF is recommended.  SW discussed short term rehab in SNF. SW provided SNF list for review ([email or web address]).  Family asked to look for pt Medicare Card to see what does not match since some SNFs indicate ss card number is incorrect. Family will continue to look for card (family brought in [place] card). Family would like 1) [facility], and 2) [facility]. SW shared will need medicare card for [facility].SW will f/u with family to discuss if other preferred locations since they are still reviewing. ? ?[name], MSW, LCSWA ?Office: [phone] ?Cell: [phone] ?Fax: [phone]  ?

## 2021-05-30 NOTE — Progress Notes (Addendum)
Occupational Therapy Session Note ? ?Patient Details  ?Name: [name] ?MRN: [id number] ?Date of Birth: November 10, 1960 ? ?Today's Date: 05/30/2021 ?OT Individual Time: 7416-3845 ?OT Individual Time Calculation (min): 42 min  ? ? ?Short Term Goals: ?Week 3:  OT Short Term Goal 1 (Week 3): STG = LTG 2/2 ELOS ? ?Skilled Therapeutic Interventions/Progress Updates:  ?Pt greeted  seated in recliner agreeable to OT intervention.family not present for scheduled family ed session.  Session focus on functional mobility, dynamic standing balance, standing tolerance, increasing activity tolerance and decreasing overall caregiver burden.     ?Used stedy to transfer pt from recliner to w/c with pt able to stand to stedy with MODA, total A transport to w/c. Total A transport to gym.    ?       ?Pt completed sit>stand with Rw with MAX A initially but progressing to MOD A with increased reps. Pt completed therapeutic activity of tossing horseshoes with RUE in standing to challenge dynamic standing balance and increase standing tolerance for ADLs, pt completed task with [facility] for standing balance. Worked on upright posture with pt instructed to place horseshoes from RW to top of mirror with MOD verbal/visual cues for posture to shift hips anteriorly and elevate trunk.pt needed seated rest break post activity. SpO2 98% on RA HR 80 bpm.  ?Pt transported back to room with total A where pt completed stand pivot back to recliner with MAX A for sit>stand and MOD A to pivot with RW. ? pt left seated in recliner with alarm belt activated and all needs within reach.                     ? ? ?Therapy Documentation ?Precautions:  ?Precautions ?Precautions: Fall ?Precaution Comments: baseline R HP and cognitive deficits ?Restrictions ?Weight Bearing Restrictions: Yes ?RLE Weight Bearing: Partial weight bearing ?Other Position/Activity Restrictions: heel WB with surgical shoe ? ?  ?Pain: ?No pain reported during session  ? ? ?Therapy/Group: Individual  Therapy ? ?Precious Haws ?05/30/2021, 3:35 PM ?

## 2021-05-31 DIAGNOSIS — E1142 Type 2 diabetes mellitus with diabetic polyneuropathy: Secondary | ICD-10-CM

## 2021-05-31 LAB — GLUCOSE, CAPILLARY
Glucose-Capillary: 92 mg/dL (ref 70–99)
Glucose-Capillary: 87 mg/dL (ref 70–99)
Glucose-Capillary: 103 mg/dL — ABNORMAL HIGH (ref 70–99)
Glucose-Capillary: 137 mg/dL — ABNORMAL HIGH (ref 70–99)

## 2021-05-31 NOTE — Progress Notes (Signed)
Speech Language Pathology Discharge Summary ? ?Patient Details  ?Name: [name] ?MRN: [id number] ?Date of Birth: [birth year] ? ?Today's Date: 06/01/2021 ?SLP Individual Time: 7829-5621 ?SLP Individual Time Calculation (min): 45 min ? ? ?Skilled Therapeutic Interventions:  Skilled ST services focused on education and swallow skills. SLP facilitated education pertaining to dys 3 textures verse regular textures. Pt supports desire to remain on dys 3 textures verse regular textures, due to missing dentition and fatigue during self-feeding/mastication. SLP provided dys 3 texture handout and reviewed foods to avoid and ways to prepare foods to create a soft texture. All questions answered to satisfaction. SLP provided set up assist and supervision A navigating tray during dys 3 textures and thin liquid lunch tray.Pt was left in room with call bell within reach and chair alarm set. SLP recommends to continue skilled services. ? ?Patient has met 4 of 4 long term goals.  Patient to discharge at overall Supervision;Min level.  ?Reasons goals not met: All goals met  ? ?Clinical Impression/Discharge Summary: Patient has made functional gains and has met 4 of 4 long-term goals this admission due to improved orientation, functional recall with use of compensatory aids, intellectual awareness, and basic problem solving. Patient is currently completing basic, functional cognitive tasks with sup-to-min A cues when alertness is optimal. Patient is currently consuming a dys 3 textures diet and thin liquids with mod I-to-sup A to implement safe swallowing precautions/strategies and benefits from supervision to support self-feeding abilities. SLP spoke with pt's sister by phone who supported pt is at baseline function from a cognitive standpoint and feels pleased with his progress in therapy overall. She stated "I feel like it's my brother again" and "I can see it in his smile." Patient and family education is complete and patient to  discharge at overall sup-to-min A level at SNF level of care. Continued SLP services do not appear warranted at this time due to pt likely being at cognitive baseline.  ?  ?Care Partner:  ?Caregiver Able to Provide Assistance: Other (comment) (SNF discharge)  ?Type of Caregiver Assistance:  (SNF level of care) ? ?Recommendation:  ?Skilled Nursing facility;24 hour supervision/assistance  ?Rationale for SLP Follow Up: Other (comment) (None recommended; family confirm pt is likely at cognitive baseline)  ? ?Equipment: Pt provided with memory notebook; no other equipment provided or recommended  ? ?Reasons for discharge: Treatment goals met  ? ?Patient/Family Agrees with Progress Made and Goals Achieved: Yes  ? ? ?Brianne T Garretson ?05/31/2021, 4:19 PM ? ?

## 2021-05-31 NOTE — Progress Notes (Signed)
Occupational Therapy Weekly Progress Note ? ?Patient Details  ?Name: [name] ?MRN: [id number] ?Date of Birth: [birth year] ? ?Beginning of progress report period: May 22, 2021 ?End of progress report period: May 31, 2021 ? ? ?No STG set as pt initially thought to DC on 5/3. Pt DC extended in setting of UTI and decreased functional mobility and requiring total A for care. Family present for PT family education and undable to provide current level of care, pt now pending SNF DC. Pt making slow but stedy functional gains post UTI, progressed from total A of 2 in stedy to now min A of one or mod A stand-pivot transfers of 1 with RW. Able to self-feed with S and set-up A, mod A overall for UBD/bathing, and continued total A for LBD/toileting tasks.  ? ?Patient continues to demonstrate the following deficits: muscle weakness, decreased cardiorespiratoy endurance, impaired timing and sequencing, unbalanced muscle activation, decreased coordination, and decreased motor planning, decreased initiation, decreased attention, decreased awareness, decreased problem solving, decreased safety awareness, decreased memory, and delayed processing, and decreased sitting balance, decreased standing balance, decreased postural control, and decreased balance strategies and therefore will continue to benefit from skilled OT intervention to enhance overall performance with BADL and Reduce care partner burden. ? ?Patient not progressing toward long term goals.  See goal revision..  Will downgrade min A LBD/toileting goals to mod A post functional decline in setting of UTI. ? ?OT Short Term Goals ?Week 4:  OT Short Term Goal 1 (Week 4): STG = LTG 2/2 ELOS ? ?Volanda Napoleon MS, OTR/L ? ?05/31/2021, 7:52 AM  ?

## 2021-05-31 NOTE — Progress Notes (Signed)
[facility] PHYSICAL MEDICINE & REHABILITATION PROGRESS NOTE ? ?Subjective/Complaints: ? ?SNF placement planned  ?Labs reviewed ?Remains afebrile  ? ?ROS: Denies CP, SOB, N/V/D ? ? ?Objective: ?Vital Signs: ?Blood pressure 122/73, pulse 66, temperature 98.2 ?F (36.8 ?C), resp. rate 14, height 5\' 7"  (1.702 m), weight 71.2 kg, SpO2 100 %. ?No results found. ?No results for input(s): WBC, HGB, HCT, PLT in the last 72 hours. ? ? ?Recent Labs  ?  05/30/21 ?8676  ?NA 133*  ?K 3.5  ?CL 103  ?CO2 25  ?GLUCOSE 83  ?BUN 19  ?CREATININE 0.81  ?CALCIUM 8.7*  ? ? ? ?Intake/Output Summary (Last 24 hours) at 05/31/2021 0747 ?Last data filed at 05/30/2021 1847 ?Gross per 24 hour  ?Intake 497 ml  ?Output --  ?Net 497 ml  ? ?  ? ?Pressure Injury 05/28/21 Buttocks Stage 2 -  Partial thickness loss of dermis presenting as a shallow open injury with a red, pink wound bed without slough. (Active)  ?05/28/21 1837  ?Location: Buttocks  ?Location Orientation:   ?Staging: Stage 2 -  Partial thickness loss of dermis presenting as a shallow open injury with a red, pink wound bed without slough.  ?Wound Description (Comments):   ?Present on Admission: -- (unknown)  ?Dressing Type Foam - Lift dressing to assess site every shift 05/30/21 1945  ? ? ?Physical Exam: ?BP 122/73 (BP Location: Left Arm)   Pulse 66   Temp 98.2 ?F (36.8 ?C)   Resp 14   Ht 5\' 7"  (1.702 m)   Wt 71.2 kg   SpO2 100%   BMI 24.58 kg/m?  ?Gen: no distress, normal appearing ?HEENT: oral mucosa pink and moist, NCAT ?Cardio: Reg rate ?Chest: normal effort, normal rate of breathing ?Abd: soft, non-distended ?Ext: no edema ?Psych: pleasant, normal affect ?Oriented to person and place  ? ?4/24 ? ? ? ? ? ?Looks identical on 5/8 ?Psych: no lability agitation  ?  ?Neuro: 4-/5 throughout,  ? ?Hand intrinsic atrophy with reduced sensation in 5th fingers bilateral , also reduced sensation feet to LT , ? ?Assessment/Plan: ?1. Functional deficits which require 3+ hours per day of  interdisciplinary therapy in a comprehensive inpatient rehab setting. ?Physiatrist is providing close team supervision and 24 hour management of active medical problems listed below. ?Physiatrist and rehab team continue to assess barriers to discharge/monitor patient progress toward functional and medical goals ? ? ?Care Tool: ? ?Bathing ?   ?Body parts bathed by patient: Face, Chest  ? Body parts bathed by helper: Right lower leg, Left lower leg, Buttocks, Right arm, Left arm, Abdomen, Left upper leg ?  ?  ?Bathing assist Assist Level: Total Assistance - Patient < 25% ?  ?  ?Upper Body Dressing/Undressing ?Upper body dressing   ?What is the patient wearing?: Pull over shirt ?   ?Upper body assist Assist Level: Maximal Assistance - Patient 25 - 49% ?   ?Lower Body Dressing/Undressing ?Lower body dressing ? ? ?   ?What is the patient wearing?: Pants ? ?  ? ?Lower body assist Assist for lower body dressing: Total Assistance - Patient < 25% ?   ? ?Toileting ?Toileting Toileting Activity did not occur (Probation officer and hygiene only): N/A (no void or bm)  ?Toileting assist Assist for toileting: Dependent - Patient 0% (stedy) ?  ?  ?Transfers ?Chair/bed transfer ? ?Transfers assist ?   ? ?Chair/bed transfer assist level: Dependent - mechanical lift ?  ?  ?Locomotion ?Ambulation ? ? ?Ambulation assist ? ?   ? ?  Assist level: Minimal Assistance - Patient > 75% ?Assistive device: Walker-rolling ?Max distance: 120 ft  ? ?Walk 10 feet activity ? ? ?Assist ?   ? ?Assist level: Minimal Assistance - Patient > 75% ?Assistive device: Walker-rolling  ? ?Walk 50 feet activity ? ? ?Assist Walk 50 feet with 2 turns activity did not occur: Safety/medical concerns ? ?Assist level: Minimal Assistance - Patient > 75% ?Assistive device: Walker-rolling  ? ? ?Walk 150 feet activity ? ? ?Assist Walk 150 feet activity did not occur: Safety/medical concerns ? ?  ?  ?  ? ?Walk 10 feet on uneven surface  ?activity ? ? ?Assist Walk 10 feet on  uneven surfaces activity did not occur: Safety/medical concerns ? ? ?  ?   ? ?Wheelchair ? ? ? ? ?Assist Is the patient using a wheelchair?: Yes ?Type of Wheelchair: Manual ?  ? ?Wheelchair assist level: Minimal Assistance - Patient > 75% ?Max wheelchair distance: 150  ? ? ?Wheelchair 50 feet with 2 turns activity ? ? ? ?Assist ? ?  ?  ? ? ?Assist Level: Minimal Assistance - Patient > 75%  ? ?Wheelchair 150 feet activity  ? ? ? ?Assist ?   ? ? ?Assist Level: Minimal Assistance - Patient > 75%  ? ? ?Medical Problem List and Plan: ?1. Functional deficits secondary to peripheral arterial disease, RIght 1st ray, 2,3,4 toe amps due to osteomyelitis with pop art stenting Right side surgery and acute pulmonary embolus. ?Continue CIR PT, OT - ELOS 5/3- will need to extend to 5/6 due to UTI with decline in fxn  ?Stable for d/c medically but now at mod/ max A x 1 (improved from max A x 2 earlier this week)  ?Expect slow progress with reconditioning, rec SNF unless family able to handle mod A level , family training Monday  ? ?2.  Antithrombotics: ?-DVT/anticoagulation:  Pharmaceutical: had PE now on Eliquis- no sign of surgical site bleeding  ?            -antiplatelet therapy: Aspirin 81 mg daily ?3. Pain Management: Tylenol, tramadol as needed ? Monitor with increased exertion ?4. Onchomycosis of left toes: antifungal cream ordered BID.  ?5. Neuropsych: This patient is capable of making decisions on his own behalf. ?6. Right foot incision: ?            --Daily dry dressing change ?7. Fluids/Electrolytes/Nutrition: Routine I's and O's ?            -- Carb modified diet ?8: PAD: Status post Right popliteal stent placement ?            -- Continue aspirin while on Eliquis.  Stop Plavix. ?            -- Follow-up with Dr. Lucky Cowboy with ABIs ?            -- Status post right partial first ray amputation  ?            -- Right heel weightbearing with postop shoe- trial of darco sandal PT evaluating use  ?No swelling no  ?9: prior  CVA: ?10: Dyslipidemia: Continue Lipitor 40 mg daily ?11: Acute PE: Continue Eliquis ?12: Hypertension: Continue Norvasc 10 mg daily ?Vitals:  ? 05/30/21 1928 05/31/21 0441  ?BP: 115/73 122/73  ?Pulse: 75 66  ?Resp: 14 14  ?Temp: 97.8 ?F (36.6 ?C) 98.2 ?F (36.8 ?C)  ?SpO2: 100% 100%  ? Controlled 5/9 ? ?13: DM type II with peripheral neuropathy  Increase  metformin to 500mg  BID  ? CBG (last 3) will order , pt states he has no CBG monitor at home  ?Recent Labs  ?  05/30/21 ?1700 05/30/21 ?2017 05/31/21 ?5009  ?GLUCAP 144* 141* 92  ? ?CBG controlled 5/9 ? ?14: Seizure disorder maintained on Keppra: Continue 500 mg daily, ? If PCP managed this in past  ?15: Constipation: last BM 5/6 ?         ?16: Urinary retention: d/c foley ?           no cath since 5/5 , cont flomax and urecholine  ?17: Chronic diastolic congestive heart failure, euvolemic, no diuretics ?            -- Grade 1 diastolic dysfunction on recent echo ?18.  Hyponatremia ? Na improved to 134, monitor weekly ?19.  Bradykinesia , masked facies, min cogwheeling, no hx PD , no showed for appt with  Dr Jaynee Eagles- would rec f/u post hospitalization   ?Hx of remote RIght subcortical infarcts which may cause a parkinson like syndrome  , seizure d/o ?20.  Fever up to 103 F 5/2 ,  neg cx result  but was on keflex x 2 d when cx taken , abx regimen modified with addition of vanc to cover gm + , No clinical evidence of PNA or wound infection  ?Xray foot showing osseous irregularity , MRI neg for infection ,  ?ID has signed off ?21.  UTI resolved  ?LOS: 25 days ?A FACE TO FACE EVALUATION WAS PERFORMED ? ?[name] ?05/31/2021, 7:47 AM ? ?

## 2021-05-31 NOTE — Progress Notes (Signed)
Patient ID: [name], male   DOB: Oct 21, 1960, 61 y.o.   MRN: [id number] ? ?This SW covering for primary SW, Erlene Quan.  ? ?SW spoke with pt sister [name] to discuss SNF preference. Preferred SNF locations include: 1) [facility], 2) [facility], 3) [facility], 4) [facility], and 5) [facility]. She is aware [name] will follow-up with updates.  ? ?SW spoke with [name]/Admissions with [facility] to discuss referral. SW shared Medicare has incorrect DOB listed in system and his DOB I accurate on his driver's license and Medicaid card. SW encouraged her to look up his information in [employer] to see the issus. Offered to send driver's license if needed. Reports she will discuss with business office and follow-up. ? ?[name], MSW, LCSWA ?Office: [phone] ?Cell: [phone] ?Fax: [phone]  ?

## 2021-05-31 NOTE — Progress Notes (Signed)
Occupational Therapy Session Note ? ?Patient Details  ?Name: [name] ?MRN: [id number] ?Date of Birth: Jan 29, 1960 ? ?Today's Date: 05/31/2021 ?OT Individual Time: 1130-1200 ?OT Individual Time Calculation (min): 30 min  ? ? ?Short Term Goals: ?Week 1:  OT Short Term Goal 1 (Week 1): pt will complete >2 standing grooming task with CGA ?OT Short Term Goal 1 - Progress (Week 1): Met ?OT Short Term Goal 2 (Week 1): Pt will complete ambulatory toilet transfer with min A and LRAD ?OT Short Term Goal 2 - Progress (Week 1): Not met ?OT Short Term Goal 3 (Week 1): Pt will don pants with mod A ?OT Short Term Goal 3 - Progress (Week 1): Not met ?OT Short Term Goal 4 (Week 1): Pt will assist with donning surgical shoe wwith no more than mod A. ?OT Short Term Goal 4 - Progress (Week 1): Not met ?OT Short Term Goal 5 (Week 1): Pt will complete ambulatory toilet transfer with min A and LRAD ?Week 2:  OT Short Term Goal 1 (Week 2): Pt will assist with donning surgical shoe wwith no more than mod A. ?OT Short Term Goal 1 - Progress (Week 2): Met ?OT Short Term Goal 2 (Week 2): Pt will don pants with mod A ?OT Short Term Goal 2 - Progress (Week 2): Met ?OT Short Term Goal 3 (Week 2): Pt will don shirt with S. ?OT Short Term Goal 3 - Progress (Week 2): Met ?Week 3:  OT Short Term Goal 1 (Week 3): STG = LTG 2/2 ELOS ? ?Skilled Therapeutic Interventions/Progress Updates:  ?  1:1  Pt asleep in the bed when arrived. Pt able to come to  EOB with supervision with more than reasonable amt of time. AtEob worked to put on his left shoe requiring max A to don and then fasten. Donned pt's DARCO shoe total A. Pt came into standing with mod A and transferred to recliner with min A with RW with cues for safety. Participated in shaving at the sink with mod A to wash face and apply cream and A to attend to task. A to help shave.  ? ?Setup for lunch and safety measures put into place.  ? ?Therapy Documentation ?Precautions:   ?Precautions ?Precautions: Fall ?Precaution Comments: baseline R HP and cognitive deficits ?Restrictions ?Weight Bearing Restrictions: Yes ?RLE Weight Bearing: Partial weight bearing ?Other Position/Activity Restrictions: heel WB with surgical shoe ?General: ?  ?Vital Signs: ?Therapy Vitals ?Temp: 97.6 ?F (36.4 ?C) ?Temp Source: Oral ?Pulse Rate: 75 ?Resp: 18 ?BP: 117/69 ?Patient Position (if appropriate): Lying ?Oxygen Therapy ?SpO2: 100 % ?O2 Device: Room Air ?Pain: ? No c/o pain  ? ? ?Therapy/Group: Individual Therapy ? ?Nicoletta Ba ?05/31/2021, 3:29 PM ?

## 2021-05-31 NOTE — Progress Notes (Signed)
Occupational Therapy Session Note ? ?Patient Details  ?Name: [name] ?MRN: [id number] ?Date of Birth: 1960/10/16 ? ?Today's Date: 05/31/2021 ?OT Individual Time: 6063-0160 ?OT Individual Time Calculation (min): 49 min  ? ? ?Short Term Goals: ?Week 1:  OT Short Term Goal 1 (Week 1): pt will complete >2 standing grooming task with CGA ?OT Short Term Goal 1 - Progress (Week 1): Met ?OT Short Term Goal 2 (Week 1): Pt will complete ambulatory toilet transfer with min A and LRAD ?OT Short Term Goal 2 - Progress (Week 1): Not met ?OT Short Term Goal 3 (Week 1): Pt will don pants with mod A ?OT Short Term Goal 3 - Progress (Week 1): Not met ?OT Short Term Goal 4 (Week 1): Pt will assist with donning surgical shoe wwith no more than mod A. ?OT Short Term Goal 4 - Progress (Week 1): Not met ?OT Short Term Goal 5 (Week 1): Pt will complete ambulatory toilet transfer with min A and LRAD ?Week 2:  OT Short Term Goal 1 (Week 2): Pt will assist with donning surgical shoe wwith no more than mod A. ?OT Short Term Goal 1 - Progress (Week 2): Met ?OT Short Term Goal 2 (Week 2): Pt will don pants with mod A ?OT Short Term Goal 2 - Progress (Week 2): Met ?OT Short Term Goal 3 (Week 2): Pt will don shirt with S. ?OT Short Term Goal 3 - Progress (Week 2): Met ?Week 3:  OT Short Term Goal 1 (Week 3): STG = LTG 2/2 ELOS ? ?Skilled Therapeutic Interventions/Progress Updates:  ?  Pt received asleep in bed, easily awakened to voice, denies pain, agreeable to therapy. Session focus on self-care retraining, activity tolerance in prep for improved ADL/IADL/func mobility performance + decreased caregiver burden. Came to sitting EOB with mod A to lift trunk and heavy use of bed features, 1 LOB posteriorly onto bed when attempting to pull up pants past needs. Total A for LBD this date. Sit to stand form elevated height bed with heavy mod A, but inability to come into fully upright posture. Stedy transfer with min A > toilet and toileting tasks  with total A. Continent void of bladder. Min A to power up from low BSC height, but much improvement in mobility since previous week.  ? ?Pt washed face with S in recliner. Set-up A for breakfast and S provided due to poor postural control initially in recliner, pt with tendency to lean L with poor awareness/self-correction. Pt able to self-feed with R hand and increased time, did place his built up handle on utensil with moderate improvement in functional grasp on utensil. MD in/out morning assessment. ? ? ?Pt left seated in recliner with safety belt alarm engaged, call bell in reach, and all immediate needs met.  ? ? ?Therapy Documentation ?Precautions:  ?Precautions ?Precautions: Fall ?Precaution Comments: baseline R HP and cognitive deficits ?Restrictions ?Weight Bearing Restrictions: Yes ?RLE Weight Bearing: Partial weight bearing ?Other Position/Activity Restrictions: heel WB with surgical shoe ? ?Pain: ?  denies ?ADL: See Care Tool for more details. ? ? ?Therapy/Group: Individual Therapy ? ?Volanda Napoleon MS, OTR/L ? ?05/31/2021, 6:54 AM ?

## 2021-05-31 NOTE — Progress Notes (Signed)
Physical Therapy Session Note ? ?Patient Details  ?Name: [name] ?MRN: [id number] ?Date of Birth: 09-08-1960 ? ?Today's Date: 05/31/2021 ?PT Individual Time: 4360-6770 ?PT Individual Time Calculation (min): 56 min  ? ?Short Term Goals: ?Week 2:  PT Short Term Goal 1 (Week 2): Pt will ambulate 160f with CGA and LRAD ?PT Short Term Goal 1 - Progress (Week 2): Met ?PT Short Term Goal 2 (Week 2): pt will ascend  8 steps with min assist consistently ?PT Short Term Goal 2 - Progress (Week 2): Progressing toward goal ?PT Short Term Goal 3 (Week 2): [facility] ?PT Short Term Goal 3 - Progress (Week 2): Met ?PT Short Term Goal 4 (Week 2): Pt will propell WC 1066fwith supervision assist ?PT Short Term Goal 4 - Progress (Week 2): Met ?Week 3:  PT Short Term Goal 1 (Week 3): STG=LTG due to ELOS ?PT Short Term Goal 1 - Progress (Week 3): Progressing toward goal ?Week 4:  PT Short Term Goal 1 (Week 4): STG=LTG due to ELOS ? ?Skilled Therapeutic Interventions/Progress Updates:  ?  Pt presents in bed and slow to arouse. Extra time needed for processing for all tasks. Session focused mainly on NMR to address functional mobility, balance, postural control retraining, and transitional movements.  ? ?Min for rolling with mulitmodal cues and mod assist for bed mobility to come to EOB with use of bed rail for support going to the R. Required facilitation at trunk. Total assist to don shoe on L and DARCO on R. Significant amount of extra time needed for sit > stand from EOB with slightly elevated and cues for hand placement and anterior weightshift. Pt required heavy mod assist for sit > stand and tactile cues for anterior weigthshift. Maintains flexed and crouched posture during stands tep transfer to the w/c to the L with small steps and extra time with min assist. Transported to gym for energy conservation.  Focused on sit <> stand technique with emphasis on anterior weightshift  facilitation of transition with mod assist throughout session x 3...cues for hand placement. Tactile input and facilitaiton of upright posture in standing but pt immediately returned to crouched posture during gait x 6' and x 5' each trial limited by fatigue per report.  ? ?From w/c used Kinetron for BLE strengthening and reciprocal movement pattern retraining x 2 bouts of about 2-3 min each time on resitance of 40 cm/sec. End of session returned back to room with same transfer technique back to bed (mod to stand and min for stand stepping) with RW. CGA to return to supine for assist for RLE management and min to mod assist to reposition.  ? ?Wrote in memory notebook at end of session.  ? ?Therapy Documentation ?Precautions:  ?Precautions ?Precautions: Fall ?Precaution Comments: baseline R HP and cognitive deficits ?Restrictions ?Weight Bearing Restrictions: Yes ?RLE Weight Bearing: Partial weight bearing ?Other Position/Activity Restrictions: heel WB with surgical shoe ? ?Pain: ? Does not c/o pain.  ? ? ? ?Therapy/Group: Individual Therapy ? ?GrAllayne GitelmanAlLars MassonPT, DPT, CBIS ? ?05/31/2021, 2:47 PM  ?

## 2021-05-31 NOTE — Progress Notes (Signed)
Speech Language Pathology Weekly Progress and Session Note ? ?Patient Details  ?Name: [name] ?MRN: [id number] ?Date of Birth: March 20, 1960 ? ?Beginning of progress report period: May 19, 2021 ?End of progress report period: May 29, 2021 ? ?Short Term Goals: ?Week 2: SLP Short Term Goal 1 (Week 2): STG=LTG due to ELOS ?SLP Short Term Goal 1 - Progress (Week 2): Progressing toward goal ? ?New Short Term Goals: ?Week 3: SLP Short Term Goal 1 (Week 3): STG=LTG due to ELOS ? ?Weekly Progress Updates: Patient has made functional gains and continues to progress toward LTGs in the areas of basic problem solving, intellectual awareness, and functional recall. Pt is currently requiring sup-to-min A in these areas. Pt exhibited increased weakness/fatigue likely associated with diagnosis of UTI during this reporting period. SLP was consulted due to report of difficulty with mastication and taking medications. Per clinical bedside assessment pt exhibited symptoms of a mild oral dysphagia. Due to oral deficits impacting efficiency of oral prep and possibility of fatigue with intake, SLP recommended downgrade to dys 2 diet with thin liquids; meds whole in puree. Over the next two days, pt demonstrated significant improvement and was advanced to dysphagia 3 diet and thin liquids. Pt continues to take pills whole in puree.Patient and family education is ongoing. Patient would benefit from continued skilled SLP intervention to maximize cognitive and swallow functioning and overall functional independence prior to discharge.   ?  ?Intensity: Minumum of 1-2 x/day, 30 to 90 minutes ?Frequency: 3 to 5 out of 7 days ?Duration/Length of Stay: SNF pending ?Treatment/Interventions: Dysphagia/aspiration precaution training;Patient/family education;Cognitive remediation/compensation;Cueing hierarchy;Functional tasks;Internal/external aids;Speech/Language facilitation;Therapeutic Activities ? ?Therapy/Group: Individual Therapy ? ?Ivi Griffith T  Serenna Deroy ?05/31/2021, 4:14 PM ? ? ? ? ? ? ?

## 2021-05-31 NOTE — Progress Notes (Signed)
Speech Language Pathology Note ? ?Patient Details  ?Name: [name] ?MRN: [id number] ?Date of Birth: 1960-01-28 ?Today's Date: 05/31/2021 ? ?SLP contacted pt's sister by phone to discuss any questions/concerns pertaining to speech therapy services, as family was unable to attend scheduled education session yesterday. SLP also attempted to contact pt's brother by phone but there was no answer. Pt's sister endorsed noticeable improvement in pt's cognition in the areas of his "focus", processing speed, memory, and ability to engage in conversations. She expressed "I feel like it's my brother again" and "I can see it in his smile." Pt's sister feels pt is at cognitive baseline considering unresolved impairments from previous CVA with no further questions/comments/concerns at this time. Sister in agreement with discharge from [facility] this week due to pt likely being at cognitive baseline; swallowing needs are met. Plan to complete additional education and discussion on dys 3 diet vs. regular diet with pt prior to signing off.  ? ?[name] ?05/31/2021, 4:29 PM  ?

## 2021-05-31 NOTE — Progress Notes (Signed)
Physical Therapy Session Note ? ?Patient Details  ?Name: [name] ?MRN: [id number] ?Date of Birth: Aug 12, 1960 ? ?Today's Date: 05/31/2021 ?PT Individual Time: 0915-1000 ?PT Individual Time Calculation (min): 45 min  ? ?Short Term Goals: ?Week 4:  PT Short Term Goal 1 (Week 4): STG=LTG due to ELOS ? ?Skilled Therapeutic Interventions/Progress Updates:  ?   ?Pt received sitting in recliner - awake and agreeable to PT tx. Denies pain but reports poor nights rest, unable to explain why. Able to recall that he had prior therapy session - unable to recall events in therapy or therapist's name. Pt with DARCO on R foot.  ? ?Sit<>Stand from recliner with min/modA to RW - delayed initiation/processing and very slow to engage posterior chain to achieve standing transition. In standing, has crouched posture with soft knees, shaking and concern for potential buckling. Stand<>pivot transfer with minA and RW from recliner to w/c with cues for safety and RW management. ? ?Instructed in w/c propulsion using BUE to propel. He was unable to navigate tight spaces or navigate environmental barriers within his room nor the hallway. He was able to propel himself ~5-10 ft with supervision but would drift to the R, thus requiring minA to maintain straight path for a total distance of ~53ft. Pt with inadequate shldr extension with poor stroke efficiency, speed <0.80m/s. ? ?Instructed in stair training using 6inch steps and 2 hand rails. Required minA for ascending and modA for descending 4 steps. Difficulty with bringing his R foot down the steps as the back of the [facility] boot catches the step. He also fatigues quickly and has strong posterior/crouched posture that he is unable to self correct. ? ?Instructed in TUG with RW and minA. Completed in 140sec. Score > 13.5 seconds indicate increased falls risk. This is a decline compared to when he completed test on 4/15 when he completed in 77sec. ? ?Transported back to his room in w/c and  assisted to recliner via stand<>pivot modA transfer with no AD. Unable to tolerate BLE fully elevated in recliner due to B knee flexor contractures. Safety belt alarm on, call bell in reach at end of session.  ? ?Therapy Documentation ?Precautions:  ?Precautions ?Precautions: Fall ?Precaution Comments: baseline R HP and cognitive deficits ?Restrictions ?Weight Bearing Restrictions: Yes ?RLE Weight Bearing: Partial weight bearing ?Other Position/Activity Restrictions: heel WB with surgical shoe ?General: ?  ? ? ?Therapy/Group: Individual Therapy ? ?Arrington Bencomo P Kayde Atkerson ?05/31/2021, 7:36 AM  ?

## 2021-06-01 ENCOUNTER — Ambulatory Visit: Payer: Self-pay | Admitting: Urology

## 2021-06-01 LAB — GLUCOSE, CAPILLARY
Glucose-Capillary: 91 mg/dL (ref 70–99)
Glucose-Capillary: 110 mg/dL — ABNORMAL HIGH (ref 70–99)
Glucose-Capillary: 93 mg/dL (ref 70–99)
Glucose-Capillary: 160 mg/dL — ABNORMAL HIGH (ref 70–99)

## 2021-06-01 MED ORDER — BISACODYL 10 MG RE SUPP: 10.0000 mg | Freq: Once | RECTAL | Status: DC

## 2021-06-01 NOTE — Progress Notes (Addendum)
PHYSICAL MEDICINE & REHABILITATION PROGRESS NOTE ? ?Subjective/Complaints: ? ?SNF placement planned  ?Labs reviewed ?Remains afebrile  ? ?ROS: Denies CP, SOB, N/V/D ? ? ?Objective: ?Vital Signs: ?Blood pressure 127/70, pulse 64, temperature 98.2 ?F (36.8 ?C), resp. rate 14, height 5' 7"  (1.702 m), weight 71.2 kg, SpO2 100 %. ?No results found. ?No results for input(s): WBC, HGB, HCT, PLT in the last 72 hours. ? ? ?Recent Labs  ?  05/30/21 ?4098  ?NA 133*  ?K 3.5  ?CL 103  ?CO2 25  ?GLUCOSE 83  ?BUN 19  ?CREATININE 0.81  ?CALCIUM 8.7*  ? ? ? ?Intake/Output Summary (Last 24 hours) at 06/01/2021 0835 ?Last data filed at 05/31/2021 1254 ?Gross per 24 hour  ?Intake 360 ml  ?Output --  ?Net 360 ml  ? ?  ? ?Pressure Injury 05/28/21 Buttocks Stage 2 -  Partial thickness loss of dermis presenting as a shallow open injury with a red, pink wound bed without slough. (Active)  ?05/28/21 1837  ?Location: Buttocks  ?Location Orientation:   ?Staging: Stage 2 -  Partial thickness loss of dermis presenting as a shallow open injury with a red, pink wound bed without slough.  ?Wound Description (Comments):   ?Present on Admission: -- (unknown)  ?Dressing Type Foam - Lift dressing to assess site every shift 05/31/21 2200  ? ? ?Physical Exam: ?BP 127/70 (BP Location: Left Arm)   Pulse 64   Temp 98.2 ?F (36.8 ?C)   Resp 14   Ht 5' 7"  (1.702 m)   Wt 71.2 kg   SpO2 100%   BMI 24.58 kg/m?  ? ?General: No acute distress ?Mood and affect are appropriate ?Heart: Regular rate and rhythm no rubs murmurs or extra sounds ?Lungs: Clear to auscultation, breathing unlabored, no rales or wheezes ?Abdomen: Positive bowel sounds, soft nontender to palpation, nondistended ?Extremities: No clubbing, cyanosis, or edema ? ? ? ?4/24 ? ? ? ? ? ?Looks identical on 5/10 ?Psych: no lability agitation  ?  ?Neuro: 4-/5 throughout,  ? ?Hand intrinsic atrophy with reduced sensation in 5th fingers bilateral , also reduced sensation feet to LT  , ? ?Assessment/Plan: ?1. Functional deficits which require 3+ hours per day of interdisciplinary therapy in a comprehensive inpatient rehab setting. ?Physiatrist is providing close team supervision and 24 hour management of active medical problems listed below. ?Physiatrist and rehab team continue to assess barriers to discharge/monitor patient progress toward functional and medical goals ? ? ?Care Tool: ? ?Bathing ?   ?Body parts bathed by patient: Face, Right arm, Left arm, Chest, Abdomen, Right lower leg, Left lower leg  ? Body parts bathed by helper: Right lower leg, Left lower leg, Buttocks, Right arm, Left arm, Abdomen, Left upper leg ?  ?  ?Bathing assist Assist Level: Minimal Assistance - Patient > 75% ?  ?  ?Upper Body Dressing/Undressing ?Upper body dressing   ?What is the patient wearing?: Pull over shirt ?   ?Upper body assist Assist Level: Minimal Assistance - Patient > 75% ?   ?Lower Body Dressing/Undressing ?Lower body dressing ? ? ?   ?What is the patient wearing?: Pants, Underwear/pull up ? ?  ? ?Lower body assist Assist for lower body dressing: Total Assistance - Patient < 25% ?   ? ?Toileting ?Toileting Toileting Activity did not occur (Probation officer and hygiene only): N/A (no void or bm)  ?Toileting assist Assist for toileting: Dependent - Patient 0% (stedy) ?  ?  ?Transfers ?Chair/bed transfer ? ?Transfers assist ?   ? ?  Chair/bed transfer assist level: Minimal Assistance - Patient > 75% ?  ?  ?Locomotion ?Ambulation ? ? ?Ambulation assist ? ?   ? ?Assist level: Minimal Assistance - Patient > 75% ?Assistive device: Walker-rolling ?Max distance: 5'  ? ?Walk 10 feet activity ? ? ?Assist ?   ? ?Assist level: Minimal Assistance - Patient > 75% ?Assistive device: Walker-rolling  ? ?Walk 50 feet activity ? ? ?Assist Walk 50 feet with 2 turns activity did not occur: Safety/medical concerns ? ?Assist level: Minimal Assistance - Patient > 75% ?Assistive device: Walker-rolling  ? ? ?Walk 150 feet  activity ? ? ?Assist Walk 150 feet activity did not occur: Safety/medical concerns ? ?  ?  ?  ? ?Walk 10 feet on uneven surface  ?activity ? ? ?Assist Walk 10 feet on uneven surfaces activity did not occur: Safety/medical concerns ? ? ?  ?   ? ?Wheelchair ? ? ? ? ?Assist Is the patient using a wheelchair?: Yes ?Type of Wheelchair: Manual ?  ? ?Wheelchair assist level: Minimal Assistance - Patient > 75% ?Max wheelchair distance: 150  ? ? ?Wheelchair 50 feet with 2 turns activity ? ? ? ?Assist ? ?  ?  ? ? ?Assist Level: Minimal Assistance - Patient > 75%  ? ?Wheelchair 150 feet activity  ? ? ? ?Assist ?   ? ? ?Assist Level: Minimal Assistance - Patient > 75%  ? ? ?Medical Problem List and Plan: ?1. Functional deficits secondary to peripheral arterial disease, RIght 1st ray, 2,3,4 toe amps due to osteomyelitis with pop art stenting Right side surgery and acute pulmonary embolus. ?Continue CIR PT, OT -plan is SNF ?Team conference today please see physician documentation under team conference tab, met with team  to discuss problems,progress, and goals. Formulized individual treatment plan based on medical history, underlying problem and comorbidities. ?  ? ?2.  Antithrombotics: ?-DVT/anticoagulation:  Pharmaceutical: had PE now on Eliquis x 66mo- no sign of surgical site bleeding  ?            -antiplatelet therapy: Aspirin 81 mg daily ?3. Pain Management: Tylenol, tramadol as needed ? Monitor with increased exertion ?4. Onchomycosis of left toes: antifungal cream ordered BID.  ?5. Neuropsych: This patient is capable of making decisions on his own behalf. ?6. Right foot incision: ?      healed no dressing needed ?7. Fluids/Electrolytes/Nutrition: Routine I's and O's ?            -- Carb modified diet ?8: PAD: Status post Right popliteal stent placement ?            -- Continue aspirin while on Eliquis.  Stop Plavix. ?            -- Follow-up with Dr. DLucky Cowboywith ABIs ?            -- Status post right partial first ray  amputation  ?            -- Right heel weightbearing with postop shoe- trial of darco sandal PT evaluating use  ?No swelling no  ?9: prior CVA: ?10: Dyslipidemia: Continue Lipitor 40 mg daily ?11: Acute PE: Continue Eliquis ?12: Hypertension: Continue Norvasc 10 mg daily ?Vitals:  ? 05/31/21 1926 06/01/21 0413  ?BP: 118/72 127/70  ?Pulse: 73 64  ?Resp: 14 14  ?Temp: 98 ?F (36.7 ?C) 98.2 ?F (36.8 ?C)  ?SpO2: 100% 100%  ? Controlled 5/9 ? ?13: DM type II with peripheral neuropathy  Increase metformin  to 563m BID  ? CBG (last 3) will order , pt states he has no CBG monitor at home  ?Recent Labs  ?  05/31/21 ?1649 05/31/21 ?2047 06/01/21 ?05170 ?GLUCAP 103* 137* 91  ? ?CBG controlled 5/10 ? ?14: Seizure disorder maintained on Keppra: Continue 500 mg daily, ? If PCP managed this in past  ?15: Constipation: last BM 5/6- needs supp today  ?         ?16: Urinary retention: d/c foley ?           no cath since 5/5 , cont flomax and urecholine  ?17: Chronic diastolic congestive heart failure, euvolemic, no diuretics ?            -- Grade 1 diastolic dysfunction on recent echo ?18.  Hyponatremia ? Na improved to 134, monitor weekly ?19.  Bradykinesia , masked facies, min cogwheeling, no hx PD , no showed for appt with  Dr AJaynee Eagles would rec f/u post hospitalization   ?Hx of remote RIght subcortical infarcts which may cause a parkinson like syndrome  , seizure d/o ?20.  Fever up to 103 F 5/2 ,  neg cx result  but was on keflex x 2 d when cx taken , abx regimen modified with addition of vanc to cover gm + , No clinical evidence of PNA or wound infection  ?Xray foot showing osseous irregularity , MRI neg for infection ,  ?ID has signed off ?21.  UTI resolved  ?LOS: 26 days ?A FACE TO FACE EVALUATION WAS PERFORMED ? ?ALuanna SalkKirsteins ?06/01/2021, 8:35 AM ? ?

## 2021-06-01 NOTE — Progress Notes (Signed)
Occupational Therapy Session Note ? ?Patient Details  ?Name: [name] ?MRN: [id number] ?Date of Birth: 1960-10-29 ? ?Today's Date: 06/01/2021 ?OT Individual Time: 0388-8280 ?OT Individual Time Calculation (min): 59 min  ? ? ?Short Term Goals: ?Week 4:  OT Short Term Goal 1 (Week 4): STG = LTG 2/2 ELOS ? ?Skilled Therapeutic Interventions/Progress Updates:  ?  Pt received awake in bed, denies pain, agreeable to therapy. Session focus on self-care retraining, activity tolerance, transfer retraining in prep for improved ADL/IADL/func mobility performance + decreased caregiver burden. Came to sitting EOB on his R with heavy use of elevated HOB and fed rail, min A to lift trunk. Intermittent min A for balance sitting EOB due to R lean/LOB when engaged in ADL. ? ?Doffed shirt min A for balance, bathed UB at same level. Able to bathe tops of BLE with min A for balance. Donned shirt with min A for balance to pull over R shoulder due to difficulty reaching with RUE. Total A to don underwear/pants/L shoe and R darco. Sit to stand from elevated height surface with mod A to come into fully upright posture with use of RW. Short ambulatory transfer > recliner with min A for RW management, continues to demo improvement in strength/activity tolerance and mobility since UTI, but continues to require heavy A overall for ADL. ? ?Se-tup A for breakfast, S provided to increased lean L, able to self-correct with cues. Self fed with built up handle and dominant RUE.  ? ?Pt oriented to date and able to recall that he got "washed up" to put in memory notebook.  ? ? ?Pt left seated in recliner with safety belt alarm engaged, call bell in reach, and all immediate needs met.  ? ? ?Therapy Documentation ?Precautions:  ?Precautions ?Precautions: Fall ?Precaution Comments: baseline R HP and cognitive deficits ?Restrictions ?Weight Bearing Restrictions: Yes ?RLE Weight Bearing: Partial weight bearing ?Other Position/Activity Restrictions: heel  WB with surgical shoe ? ?Pain: denies initially, reports difficulty getting comfortable in recliner due to "bed sores" ?  ?ADL: See Care Tool for more details. ? ? ?Therapy/Group: Individual Therapy ? ?Volanda Napoleon MS, OTR/L ? ?06/01/2021, 6:53 AM ?

## 2021-06-01 NOTE — Plan of Care (Signed)
?Problem: RH Balance ?Goal: LTG: Patient will maintain dynamic sitting balance (OT) ?Description: LTG:  Patient will maintain dynamic sitting balance with assistance during activities of daily living (OT) ?Flowsheets (Taken 06/01/2021 0754) ?LTG: Pt will maintain dynamic sitting balance during ADLs with: Minimal Assistance - Patient > 75% ?Note: Downgrade due to functional decline in setting of UTI ?Goal: LTG Patient will maintain dynamic standing with ADLs (OT) ?Description: LTG:  Patient will maintain dynamic standing balance with assist during activities of daily living (OT)  ?Flowsheets (Taken 06/01/2021 0754) ?LTG: Pt will maintain dynamic standing balance during ADLs with: Minimal Assistance - Patient > 75% ?Note: Downgrade due to functional decline in setting of UTI ?  ?Problem: Sit to Stand ?Goal: LTG:  Patient will perform sit to stand in prep for activites of daily living with assistance level (OT) ?Description: LTG:  Patient will perform sit to stand in prep for activites of daily living with assistance level (OT) ?Flowsheets (Taken 06/01/2021 0754) ?LTG: PT will perform sit to stand in prep for activites of daily living with assistance level: Minimal Assistance - Patient > 75% ?Note: Downgrade due to functional decline in setting of UTI ?  ?Problem: RH Grooming ?Goal: LTG Patient will perform grooming w/assist,cues/equip (OT) ?Description: LTG: Patient will perform grooming with assist, with/without cues using equipment (OT) ?Flowsheets (Taken 06/01/2021 0754) ?LTG: Pt will perform grooming with assistance level of: Supervision/Verbal cueing ?Note: Downgrade due to functional decline in setting of UTI ?  ?Problem: RH Bathing ?Goal: LTG Patient will bathe all body parts with assist levels (OT) ?Description: LTG: Patient will bathe all body parts with assist levels (OT) ?Flowsheets (Taken 06/01/2021 0754) ?LTG: Pt will perform bathing with assistance level/cueing: Moderate Assistance - Patient 50 - 74% ?LTG:  Position pt will perform bathing: At sink ?Note: Downgrade due to functional decline in setting of UTI ?  ?Problem: RH Dressing ?Goal: LTG Patient will perform upper body dressing (OT) ?Description: LTG Patient will perform upper body dressing with assist, with/without cues (OT). ?Flowsheets (Taken 06/01/2021 0754) ?LTG: Pt will perform upper body dressing with assistance level of: Minimal Assistance - Patient > 75% ?Note: Downgrade due to functional decline in setting of UTI ?Goal: LTG Patient will perform lower body dressing w/assist (OT) ?Description: LTG: Patient will perform lower body dressing with assist, with/without cues in positioning using equipment (OT) ?Flowsheets (Taken 06/01/2021 0754) ?LTG: Pt will perform lower body dressing with assistance level of: Maximal Assistance - Patient 25 - 49% ?Note: Downgrade due to functional decline in setting of UTI ?  ?Problem: RH Toileting ?Goal: LTG Patient will perform toileting task (3/3 steps) with assistance level (OT) ?Description: LTG: Patient will perform toileting task (3/3 steps) with assistance level (OT)  ?Flowsheets (Taken 06/01/2021 0754) ?LTG: Pt will perform toileting task (3/3 steps) with assistance level: Maximal Assistance - Patient 25 - 49% ?Note: Downgrade due to functional decline in setting of UTI ?  ?Problem: RH Toilet Transfers ?Goal: LTG Patient will perform toilet transfers w/assist (OT) ?Description: LTG: Patient will perform toilet transfers with assist, with/without cues using equipment (OT) ?Flowsheets (Taken 06/01/2021 0754) ?LTG: Pt will perform toilet transfers with assistance level of: Minimal Assistance - Patient > 75% ?Note: Downgrade due to functional decline in setting of UTI ?  ?Problem: RH Tub/Shower Transfers ?Goal: LTG Patient will perform tub/shower transfers w/assist (OT) ?Description: LTG: Patient will perform tub/shower transfers with assist, with/without cues using equipment (OT) ?Flowsheets (Taken 06/01/2021 0754) ?LTG: Pt  will perform tub/shower stall transfers with  assistance level of: Minimal Assistance - Patient > 75% ?Note: Downgrade due to functional decline in setting of UTI ?  ?

## 2021-06-01 NOTE — Progress Notes (Signed)
Physical Therapy Session Note ? ?Patient Details  ?Name: [name] ?MRN: [id number] ?Date of Birth: 19-Oct-1960 ? ?Today's Date: 06/01/2021 ?PT Individual Time: 9458-5929 ?PT Individual Time Calculation (min): 40 min  ? ?Short Term Goals: ?Week 1:  PT Short Term Goal 1 (Week 1): pt will perform bed mobility with CGA ?PT Short Term Goal 1 - Progress (Week 1): Met ?PT Short Term Goal 2 (Week 1): Pt will trasnfer to [facility] with min assist consistently ?PT Short Term Goal 2 - Progress (Week 1): Met ?PT Short Term Goal 3 (Week 1): Pt will ambulate 135f with min assist and LRAD ?PT Short Term Goal 3 - Progress (Week 1): Met ?PT Short Term Goal 4 (Week 1): Pt will ascend 8 steps with min assist ?PT Short Term Goal 4 - Progress (Week 1): Progressing toward goal ?Week 2:  PT Short Term Goal 1 (Week 2): Pt will ambulate 1247fwith CGA and LRAD ?PT Short Term Goal 1 - Progress (Week 2): Met ?PT Short Term Goal 2 (Week 2): pt will ascend  8 steps with min assist consistently ?PT Short Term Goal 2 - Progress (Week 2): Progressing toward goal ?PT Short Term Goal 3 (Week 2): [facility] ?PT Short Term Goal 3 - Progress (Week 2): Met ?PT Short Term Goal 4 (Week 2): Pt will propell WC 10059fith supervision assist ?PT Short Term Goal 4 - Progress (Week 2): Met ?Week 3:  PT Short Term Goal 1 (Week 3): STG=LTG due to ELOS ?PT Short Term Goal 1 - Progress (Week 3): Progressing toward goal ?Week 4:  PT Short Term Goal 1 (Week 4): STG=LTG due to ELOS ? ?Skilled Therapeutic Interventions/Progress Updates:  ? ?Pt received sitting in recliner and agreeable to PT. Pt performed sit<>stand transfer with RW and mod assist from Recliner with cues for BUE position to push from arm rest. Stand pivot transfer [facility] once in standing with RW. Pt [facility] to [facility]? ?Sit<>stand from [facility] assist  from PT and pT facilitating anterior weight shift by advancing RW through  transfer with 1 UE on RW handle. Gait training 2 x 25f32fes for AD management in turns and to improve posture. CGA-min from PT for safety.  ? ?PT instruct pt in dynamic gait training in parallel bars. Side stepping R and L 2 x 10ft44frward/reverse 2 x 10ft.67f/down 4inch step x 4 BLE with BUE supported on parallel bars throughout.  ? ?Patient returned to room and left sitting in [facility] in reach and all needs met.   ? ? ?   ? ?Therapy Documentation ?Precautions:  ?Precautions ?Precautions: Fall ?Precaution Comments: baseline R HP and cognitive deficits ?Restrictions ?Weight Bearing Restrictions: Yes ?RLE Weight Bearing: Partial weight bearing ?Other Position/Activity Restrictions: heel WB with surgical shoe ? ?  ?Pain: ?Pain Assessment ?Pain Scale: 0-10 ?Pain Score: 0-No pain ? ? ? ?Therapy/Group: Individual Therapy ? ?AustinLorie Phenix/2023, 11:15 AM  ?

## 2021-06-01 NOTE — Progress Notes (Signed)
Patient ID: [name], male   DOB: 22-Jan-1961, 62 y.o.   MRN: [id number] ? ?Sw spoke with patient sister, [name] and informed her that the patient Medicare benefits are still unable to be verified. The patient will not be able to transfer to SNF until the Medicare benefits are verified and with a corrected DOB. Patient sister has been instructed to go to the social security office to correct patient birthday on his Medicare information. [name], states that she will not have time to go to the social security office due to work and being unable to miss anymore days. Sw informed sister that patient will be unable to transfer without this information being corrected. Patient sister reports she is going to attempt to reach out by telephone. Sw will follow up with patient sister tomorrow.  ?

## 2021-06-01 NOTE — Patient Care Conference (Signed)
Inpatient RehabilitationTeam Conference and Plan of Care Update ?Date: 06/01/2021   Time: 10:10 AM  ? ? ?Patient Name: [name]      ?Medical Record Number: [id number]  ?Date of Birth: 1960/09/27 ?Sex: Male         ?Room/Bed: 4M10C/4M10C-01 ?Payor Info: Payor: MEDICARE / Plan: MEDICARE PART A AND B / Product Type: *No Product type* /   ? ?Admit Date/Time:  05/06/2021 11:44 AM ? ?Primary Diagnosis:  Pulmonary embolism ([place]) ? ?Hospital Problems: Principal Problem: ?  Pulmonary embolism ([place]) ?Active Problems: ?  Essential hypertension ?  Acute pulmonary embolism ([place]) ?  Controlled type 2 diabetes mellitus with hyperglycemia, without long-term current use of insulin ([place]) ?  Fever ?  Osteomyelitis ([place]) ? ? ? ?Expected Discharge Date: Expected Discharge Date:  (SNF pending) ? ?Team Members Present: ?Physician leading conference: Dr. Alysia Penna ?Social Worker Present: Erlene Quan, BSW ?Nurse Present: Dorien Chihuahua, RN ?PT Present: Barrie Folk, PT ?OT Present: Providence Lanius, OT ?SLP Present: Sherren Kerns, SLP ?PPS Coordinator present : Gunnar Fusi, SLP ? ?   Current Status/Progress Goal Weekly Team Focus  ?Bowel/Bladder ? ? incontinent to bowel and bladder  regain conitnence      ?Swallow/Nutrition/ Hydration ? ? Tolerating dys 3 diet with thin liquids  sup A  Discuss regular PO trials vs. continuation of dys 3 diet for discharge   ?ADL's ? ? mod toilet transfers, mod bathing, min UBD, total A for LBD, toileting S for seated UB ADL in w/c, family not present for scheduled fam ed  downgraded to min bathroom transfers, bathing, UBD; max toileting/LBD  BADL tasks, sit<>stands, transfers, general strengthening   ?Mobility ? ? bed mobility min assist. min-mod assist transfers. min assist gait with RW.  min assist overall for all mobility.  NMR. education for d/c. endurance. balance, safety.   ?Communication ? ?           ?Safety/Cognition/ Behavioral Observations ? sup-to-min A  sup-to-min A  problem solving, sup A memory using external aids, sup A for intellectual awareness  plan to discharge 5/10; spoke with family and they confirmed pt is likely at cognitive baseline   ?Pain ? ? denies pain  denies pain      ?Skin ? ? stage 2 buttock, right toes ampulation  wound to show signs of healing      ? ? ?Discharge Planning:  ?Patient now anticipated d/c to SNF due to LOC   ?Team Discussion: ?Patient's UTI resolved however functional status has not returned to prior level. Patient near cognitive baseline but physically declined. ? ?Patient on target to meet rehab goals: ?no, currently needs mod assist for standing, max assist for toileting and lower body dressing.  ? ?*See Care Plan and progress notes for long and short-term goals.  ? ?Revisions to Treatment Plan:  ?Regular diet trials ?SLP services discontinued; swallowing needs met ?Downgraded goals to min assist ?  ?Teaching Needs: ?Safety, medications, skin care, secondary risk management, etc. ?  ?Current Barriers to Discharge: ?Home enviroment access/layout and Lack of/limited family support ? ?Possible Resolutions to Barriers: ?Family education ?SNF recommended ?DME: W/C, has a RW ?  ? ? Medical Summary ?Current Status: UTI resolved , Right foot incision well healed ? Barriers to Discharge: Medical stability ?  ?Possible Resolutions to [employer] Focus: Looking for SNF ? ? ?Continued Need for Acute Rehabilitation Level of Care: The patient requires daily medical management by a physician with specialized training in physical medicine and rehabilitation  for the following reasons: ?Direction of a multidisciplinary physical rehabilitation program to maximize functional independence : Yes ?Medical management of patient stability for increased activity during participation in an intensive rehabilitation regime.: Yes ?Analysis of laboratory values and/or radiology reports with any subsequent need for medication adjustment and/or medical intervention. :  Yes ? ? ?I attest that I was present, lead the team conference, and concur with the assessment and plan of the team. ? ? ?Dorien Chihuahua B ?06/01/2021, 4:35 PM  ? ? ? ? ? ? ?

## 2021-06-01 NOTE — Progress Notes (Signed)
Physical Therapy Session Note ? ?Patient Details  ?Name: [name] ?MRN: [id number] ?Date of Birth: [birth year] ? ?Today's Date: 06/01/2021 ?PT Individual Time: 8110-3159 ?PT Individual Time Calculation (min): 24 min  ? ?Short Term Goals: ? ?Week 4:  PT Short Term Goal 1 (Week 4): STG=LTG due to ELOS ? ?Skilled Therapeutic Interventions/Progress Updates:  ? ?Pt received supine in bed, asleep. Unable to arouse. PT returned in 20 min  and pt agreeable to PT. Supine>sit transfer with supervision with [facility] elevation assist.  ? ?Sitting balacen EOB with supervision assist and UE supported on bed rail x 2 minutes. Sit<>stand from EOB with min assist from elevated bed. Stand pivot to [facility] with min assist for PT to control RW and facilitate improved posture. Pt reports incontince of bladder sitting in WC.  ? ?Sit<>stand x 3 to doff/don soiled/clean brief with min-mod assist and cues for 1 UE to push from WC arm rest. Pt left sitting in WC with NT present.   ? ?   ? ?Therapy Documentation ?Precautions:  ?Precautions ?Precautions: Fall ?Precaution Comments: baseline R HP and cognitive deficits ?Restrictions ?Weight Bearing Restrictions: Yes ?RLE Weight Bearing: Partial weight bearing ?Other Position/Activity Restrictions: heel WB with surgical shoe ? ?  ?Vital Signs: ?Therapy Vitals ?Temp: 97.7 ?F (36.5 ?C) ?Pulse Rate: 73 ?Resp: 18 ?BP: 123/78 ?Patient Position (if appropriate): Lying ?Oxygen Therapy ?SpO2: 100 % ?Pain: ? denies ? ? ? ?Therapy/Group: Individual Therapy ? ?[name] ?06/01/2021, 4:48 PM  ?

## 2021-06-02 DIAGNOSIS — E1369 Other specified diabetes mellitus with other specified complication: Secondary | ICD-10-CM

## 2021-06-02 DIAGNOSIS — E785 Hyperlipidemia, unspecified: Secondary | ICD-10-CM

## 2021-06-02 LAB — GLUCOSE, CAPILLARY
Glucose-Capillary: 85 mg/dL (ref 70–99)
Glucose-Capillary: 114 mg/dL — ABNORMAL HIGH (ref 70–99)
Glucose-Capillary: 136 mg/dL — ABNORMAL HIGH (ref 70–99)
Glucose-Capillary: 131 mg/dL — ABNORMAL HIGH (ref 70–99)

## 2021-06-02 MED ORDER — MELATONIN 5 MG PO TABS
5.0000 mg | ORAL_TABLET | Freq: Every day | ORAL | Status: DC
  Administered 2021-06-02: 5 mg via ORAL
  Filled 2021-06-02 (×2): qty 1

## 2021-06-02 NOTE — Progress Notes (Addendum)
[place] PHYSICAL MEDICINE & REHABILITATION PROGRESS NOTE ? ?Subjective/Complaints: ?Sitting in chair, OT in room.  Reports poor sleep. No additional complaints. ? ? ?ROS: Denies CP, SOB, N/V/D. Denies constipation. ? ? ?Objective: ?Vital Signs: ?Blood pressure 122/73, pulse 66, temperature 98.2 ?F (36.8 ?C), resp. rate 15, height 5\' 7"  (1.702 m), weight 71.2 kg, SpO2 100 %. ?No results found. ?No results for input(s): WBC, HGB, HCT, PLT in the last 72 hours. ? ? ?No results for input(s): NA, K, CL, CO2, GLUCOSE, BUN, CREATININE, CALCIUM in the last 72 hours. ? ? ?Intake/Output Summary (Last 24 hours) at 06/02/2021 9675 ?Last data filed at 06/01/2021 2200 ?Gross per 24 hour  ?Intake 960 ml  ?Output 500 ml  ?Net 460 ml  ? ?  ? ?Pressure Injury 05/28/21 Buttocks Stage 2 -  Partial thickness loss of dermis presenting as a shallow open injury with a red, pink wound bed without slough. (Active)  ?05/28/21 1837  ?Location: Buttocks  ?Location Orientation:   ?Staging: Stage 2 -  Partial thickness loss of dermis presenting as a shallow open injury with a red, pink wound bed without slough.  ?Wound Description (Comments):   ?Present on Admission: -- (unknown)  ?Dressing Type Foam - Lift dressing to assess site every shift 06/01/21 2000  ? ? ?Physical Exam: ?BP 122/73 (BP Location: Left Arm)   Pulse 66   Temp 98.2 ?F (36.8 ?C)   Resp 15   Ht 5\' 7"  (1.702 m)   Wt 71.2 kg   SpO2 100%   BMI 24.58 kg/m?  ? ?General: No acute distress ?Mood and affect are appropriate ?Heart: Regular rate and rhythm no rubs murmurs or extra sounds ?Lungs: Clear to auscultation, breathing unlabored, no rales or wheezes ?Abdomen: Positive bowel sounds, soft nontender to palpation, nondistended ?Extremities: No clubbing, cyanosis, or edema ?R foot  post op shoe ? ? ?4/24 ? ? ? ? ? ?Looks identical on 5/10 ?Psych: no lability agitation  ?  ?Neuro: 4-/5 throughout ? ?Hand intrinsic atrophy with reduced sensation in 5th fingers bilateral , also  reduced sensation feet to LT , ? ?Assessment/Plan: ?1. Functional deficits which require 3+ hours per day of interdisciplinary therapy in a comprehensive inpatient rehab setting. ?Physiatrist is providing close team supervision and 24 hour management of active medical problems listed below. ?Physiatrist and rehab team continue to assess barriers to discharge/monitor patient progress toward functional and medical goals ? ? ?Care Tool: ? ?Bathing ?   ?Body parts bathed by patient: Face, Right arm, Left arm, Chest, Abdomen, Right lower leg, Left lower leg, Front perineal area, Right upper leg, Left upper leg  ? Body parts bathed by helper: Buttocks, Right lower leg, Left lower leg ?  ?  ?Bathing assist Assist Level: Minimal Assistance - Patient > 75% ?  ?  ?Upper Body Dressing/Undressing ?Upper body dressing   ?What is the patient wearing?: Pull over shirt ?   ?Upper body assist Assist Level: Minimal Assistance - Patient > 75% ?   ?Lower Body Dressing/Undressing ?Lower body dressing ? ? ?   ?What is the patient wearing?: Pants, Underwear/pull up ? ?  ? ?Lower body assist Assist for lower body dressing: Total Assistance - Patient < 25% ?   ? ?Toileting ?Toileting Toileting Activity did not occur (Probation officer and hygiene only): N/A (no void or bm)  ?Toileting assist Assist for toileting: Dependent - Patient 0% (stedy) ?  ?  ?Transfers ?Chair/bed transfer ? ?Transfers assist ?   ? ?  Chair/bed transfer assist level: Minimal Assistance - Patient > 75% ?  ?  ?Locomotion ?Ambulation ? ? ?Ambulation assist ? ?   ? ?Assist level: Minimal Assistance - Patient > 75% ?Assistive device: Walker-rolling ?Max distance: 5'  ? ?Walk 10 feet activity ? ? ?Assist ?   ? ?Assist level: Minimal Assistance - Patient > 75% ?Assistive device: Walker-rolling  ? ?Walk 50 feet activity ? ? ?Assist Walk 50 feet with 2 turns activity did not occur: Safety/medical concerns ? ?Assist level: Minimal Assistance - Patient > 75% ?Assistive device:  Walker-rolling  ? ? ?Walk 150 feet activity ? ? ?Assist Walk 150 feet activity did not occur: Safety/medical concerns ? ?  ?  ?  ? ?Walk 10 feet on uneven surface  ?activity ? ? ?Assist Walk 10 feet on uneven surfaces activity did not occur: Safety/medical concerns ? ? ?  ?   ? ?Wheelchair ? ? ? ? ?Assist Is the patient using a wheelchair?: Yes ?Type of Wheelchair: Manual ?  ? ?Wheelchair assist level: Minimal Assistance - Patient > 75% ?Max wheelchair distance: 150  ? ? ?Wheelchair 50 feet with 2 turns activity ? ? ? ?Assist ? ?  ?  ? ? ?Assist Level: Minimal Assistance - Patient > 75%  ? ?Wheelchair 150 feet activity  ? ? ? ?Assist ?   ? ? ?Assist Level: Minimal Assistance - Patient > 75%  ? ? ?Medical Problem List and Plan: ?1. Functional deficits secondary to peripheral arterial disease, RIght 1st ray, 2,3,4 toe amps due to osteomyelitis with pop art stenting Right side surgery and acute pulmonary embolus. ?Continue CIR PT, OT -plan is SNF ?Team conference yesterday. SNF placement pending ?  ?2.  Antithrombotics: ?-DVT/anticoagulation:  Pharmaceutical: had PE now on Eliquis x 41mo - no sign of surgical site bleeding  ?            -antiplatelet therapy: Aspirin 81 mg daily ?3. Pain Management: Tylenol, tramadol as needed ? -Monitor with increased exertion ? -5/11 Rare use of tramadol, continue for now, consider discontinuation ?4. Onchomycosis of left toes: antifungal cream ordered BID.  ?5. Neuropsych: This patient is capable of making decisions on his own behalf. ?6. Right foot incision: ?      healed no dressing needed ?7. Fluids/Electrolytes/Nutrition: Routine I's and O's ?            -- Carb modified diet ?8: PAD: Status post Right popliteal stent placement ?            -- Continue aspirin while on Eliquis.  Stop Plavix. ?            -- Follow-up with Dr. Lucky Cowboy with ABIs ?            -- Status post right partial first ray amputation  ?            -- Right heel weightbearing with postop shoe- trial of darco  sandal PT evaluating use  ?No swelling no  ?9: prior CVA: ?10: Dyslipidemia: Continue Lipitor 40 mg daily,discussed healthy diet ?11: Acute PE: Continue Eliquis ?12: Hypertension: Continue Norvasc 10 mg daily ?Vitals:  ? 06/01/21 1926 06/02/21 0503  ?BP: 109/73 122/73  ?Pulse: 80 66  ?Resp: 17 15  ?Temp: 97.6 ?F (36.4 ?C) 98.2 ?F (36.8 ?C)  ?SpO2: 100% 100%  ? Controlled 5/9 ? ?13: DM type II with peripheral neuropathy  Increase metformin to 500mg  BID  ? CBG (last 3) will order , pt states he  has no CBG monitor at home  ?Recent Labs  ?  06/01/21 ?1642 06/01/21 ?2118 06/02/21 ?2956  ?GLUCAP 93 160* 85  ? ?5/11 CBG overall well controlled, continue metformin ? ?14: Seizure disorder maintained on Keppra: Continue 500 mg daily, ? If PCP managed this in past  ?15: Constipation: last BM 5/6- needs supp today  ?         ?16: Urinary retention: d/c foley ?           no cath since 5/5 , cont flomax and urecholine  ?17: Chronic diastolic congestive heart failure, euvolemic, no diuretics ?            -- Grade 1 diastolic dysfunction on recent echo ?18.  Hyponatremia ? Na improved to 134, monitor weekly ?19.  Bradykinesia , masked facies, min cogwheeling, no hx PD , no showed for appt with  Dr Jaynee Eagles- would rec f/u post hospitalization   ?Hx of remote RIght subcortical infarcts which may cause a parkinson like syndrome  , seizure d/o ?20.  Fever up to 103 F 5/2 ,  neg cx result  but was on keflex x 2 d when cx taken , abx regimen modified with addition of vanc to cover gm + , No clinical evidence of PNA or wound infection  ?Xray foot showing osseous irregularity , MRI neg for infection ,  ?ID has signed off ?21.  UTI resolved  ?LOS: 27 days ?22. Insomnia ? -5/11 increase dose melatonin  ?A FACE TO FACE EVALUATION WAS PERFORMED ? ?Jennye Boroughs ?06/02/2021, 8:22 AM ? ?

## 2021-06-02 NOTE — Progress Notes (Signed)
Inpatient Rehabilitation Care Coordinator ?Discharge Note  ? ?Patient Details  ?Name: [name] ?MRN: [id number] ?Date of Birth: 1960-09-03 ? ? ?Discharge location: [facility] ? ?Length of Stay: 28 Days ? ?Discharge activity level: Mod/Max ? ?Home/community participation: n/a ? ?Patient response YL:TEIHDT Literacy - How often do you need to have someone help you when you read instructions, pamphlets, or other written material from your doctor or pharmacy?: Never ? ?Patient response PN:SQZYTM Isolation - How often do you feel lonely or isolated from those around you?: Never ? ?Services provided included: SW, Pharmacy, TR, CM, RN, SLP, OT, PT, RD, MD ? ?Financial Services:  ?Charity fundraiser Utilized: Medicare ?  ? ?Choices offered to/list presented to: Brother ? ?Follow-up services arranged:  ?Other (Comment) ?Home Health Agency: n/a  ?  ?  ?  ? ?Patient response to transportation need: ?Is the patient able to respond to transportation needs?: Yes ?In the past 12 months, has lack of transportation kept you from medical appointments or from getting medications?: No ?In the past 12 months, has lack of transportation kept you from meetings, work, or from getting things needed for daily living?: No ? ? ? ?Comments (or additional information): ? ?Patient/Family verbalized understanding of follow-up arrangements:  Yes ? ?Individual responsible for coordination of the follow-up plan: [name] ? ?Confirmed correct DME delivered: Dyanne Iha 06/02/2021   ? ?Dyanne Iha ?

## 2021-06-02 NOTE — Progress Notes (Signed)
Physical Therapy Session Note ? ?Patient Details  ?Name: [name] ?MRN: [id number] ?Date of Birth: January 12, 1961 ? ?Today's Date: 06/02/2021 ?PT Individual Time: 0104-0200 ?PT Individual Time Calculation (min): 56 min  ? ?Short Term Goals: ? ?Week 4:  PT Short Term Goal 1 (Week 4): STG=LTG due to ELOS ? ?Skilled Therapeutic Interventions/Progress Updates:  ? ?Pt received sitting in WC and agreeable to PT. WC propulsion x 166f with min assist to improve momentum with moderate cuyes for improved force and push and increased Shoulder ROM.  ? ?PT treatment focused on dynmaic balance training. Performed with min assist from PT with BUE supported on RW. Cues for improved hip and trunkal extension throughout: ?Standing on small red wedge: static 3 x 30sec, lateral/forward reach and then toss horseshoe to target 2 x 8 Bil.  ?Standing on level surface: tandem stance 3 x 30sec , SLS 4 x 10 sec.  ? ?Gait training in hall x 77fwith min assist from PT with cues for for AD management and posture through turns.  ? ?Throughout session, sit<>stand transfers performed with min assist and stability to RW to prevent posterior bias initially when in standing.  ? ?Pt returned to room and performed ambulatory transfer to bed with RW and min assist for safety and AD management as listed above. Sit>supine completed with min assist at the LLE, and left supine in bed with call bell in reach and all needs met.  ? ? ? ?   ? ?Therapy Documentation ?Precautions:  ?Precautions ?Precautions: Fall ?Precaution Comments: baseline R HP and cognitive deficits ?Restrictions ?Weight Bearing Restrictions: Yes ?RLE Weight Bearing: Partial weight bearing ?Other Position/Activity Restrictions: heel WB with surgical shoe ? ?Pain: ?denies ? ? ? ?Therapy/Group: Individual Therapy ? ?AuLorie Phenix5/11/2021, 2:05 PM  ?

## 2021-06-02 NOTE — Progress Notes (Signed)
Physical Therapy Session Note ? ?Patient Details  ?Name: [name] ?MRN: [id number] ?Date of Birth: 01/11/1961 ? ?Today's Date: 06/02/2021 ?PT Individual Time: 0851-1000 ?PT Individual Time Calculation (min): 69 min  ? ?Short Term Goals: ? ?Week 4:  PT Short Term Goal 1 (Week 4): STG=LTG due to ELOS ? ? ?Skilled Therapeutic Interventions/Progress Updates:  ? ?Pt received sitting in recliner and agreeable to PT. Pt performed sit<>stand transfer with min A* and cues from PT for UE placement and anterior weight shift from PT for safety. PT encouraged use of toilet since last urination > hours ago. Ambulatory transfer to toilet with min assist. Pt able to perform clothing management to doff pants. Unable to void.  ? ?Pt performed 5 time sit<>stand (5xSTS): 52 sec (>15 sec indicates increased fall risk) min assist from PT throughout to facilitate anterior weight shift.  ? ?10 Meter Walk Test: ?Patient instructed to walk 10 meters (32.8 ft) as quickly and as safely as possible at their normal speed x2 and at a fast speed x2. Time measured from 2 meter mark to 8 meter mark to accommodate ramp-up and ramp-down.  ?Normal speed 1: 0.22 m/s ?Cut off scores: <0.4 m/s = household Ambulator, 0.4-0.8 m/s = limited community Ambulator, >0.8 m/s = community Ambulator, >1.2 m/s = crossing a street, <1.0 = increased fall risk ?MCID 0.05 m/s (small), 0.13 m/s (moderate), 0.06 m/s (significant)  ? ?PT instructed pt in stair management with min assist for ascent and descent x 4 each with cues for proper step to gait pattern and improved BUE placement to prevent posterior LOB ? ? ?Pt transported to entrance of [facility] in [place]. Gait training with RW 2 x 135f with CGA-min assist from PT for safety as well as cues for AD management over cracks on surface. Dynamic gait training to preform forward/reverse gait 2 x 157fwith min assist for AD management and cues for increased step length posteriorly throughout.  ?Pt noted to require assist for  stability of RW only last sit<>stand transfer. All other transfers completed with min assist throughout session .  ? ?[facility] and all needs met.   ? ? ? ? ?   ? ?Therapy Documentation ?Precautions:  ?Precautions ?Precautions: Fall ?Precaution Comments: baseline R HP and cognitive deficits ?Restrictions ?Weight Bearing Restrictions: Yes ?RLE Weight Bearing: Partial weight bearing ?Other Position/Activity Restrictions: heel WB with surgical shoe ? ?Pain: denies  ? ?Therapy/Group: Individual Therapy ? ?AuLorie Phenix5/11/2021, 10:03 AM  ?

## 2021-06-02 NOTE — Progress Notes (Signed)
Occupational Therapy Discharge Summary ? ?Patient Details  ?Name: [name] ?MRN: [id number] ?Date of Birth: [birth year] ? ? ? ?Patient has met 8 of 10 long term goals due to improved activity tolerance, improved balance, postural control, ability to compensate for deficits, improved awareness, and improved coordination.  Patient to discharge at overall Mod Assist level.  Patient's care partner unavailable to provide the necessary physical and cognitive assistance at discharge, so pt to DC to SNF as primary CG can only provide S. Pt to DC at the below ADL/bathroom transfer performance level. Assist levels represent pt's most consistent performance when alert/participatory. Pt continues to be primarily limited by decreased activity tolerance, mild chronic R HP and ataxia, posterior bias, crouched posture, and impaired postural control. Goals downgraded from CGA/S to min/max A overall due to functional decline in setting of UTI. Pt is slowly progressing, but not back yet to functional baseline pre UTI. Pt and caregivers will benefit from continued SNF OT to facilitate improved caregiver education, functional mobility, and occupational performance.  ? ? ?Reasons goals not met: Requires up to total A for LBD and toileting due to fatigue, posterior bias, slow/rigid movements, and impaired gross motor coordination. ? ?Recommendation:  ?Patient will benefit from ongoing skilled OT services in skilled nursing facility setting to continue to advance functional skills in the area of BADL and Reduce care partner burden. ? ?Equipment: ?No equipment provided ? ?Reasons for discharge: discharge from hospital ? ?Patient/family agrees with progress made and goals achieved: Yes ? ?OT Discharge ?Precautions/Restrictions  ?Precautions ?Precautions: Fall ?Precaution Comments: baseline R HP and cognitive deficits ?Restrictions ?Weight Bearing Restrictions: Yes ?RLE Weight Bearing: Partial weight bearing ?Other Position/Activity  Restrictions: heel WB with surgical shoe ? ?ADL ?ADL ?Eating: Set up ?Where Assessed-Eating: Chair ?Grooming: Supervision/safety ?Where Assessed-Grooming: Sitting at sink ?Upper Body Bathing: Supervision/safety ?Where Assessed-Upper Body Bathing: Shower ?Lower Body Bathing: Minimal assistance ?Where Assessed-Lower Body Bathing: Shower ?Upper Body Dressing: Minimal assistance ?Where Assessed-Upper Body Dressing: Chair ?Lower Body Dressing: Maximal assistance, Dependent ?Where Assessed-Lower Body Dressing: Chair ?Toileting: Maximal assistance, Dependent ?Where Assessed-Toileting: Toilet ?Toilet Transfer: Minimal assistance ?Toilet Transfer Method: Stand pivot ?Toilet Transfer Equipment: Raised toilet seat ?Tub/Shower Transfer: Not assessed ?Walk-In Shower Transfer: Minimal assistance ?Walk-In Shower Transfer Method: Stand pivot ?Walk-In Shower Equipment: Civil engineer, contracting with back, Grab bars ?Vision ?Baseline Vision/History: 1 Wears glasses (readers) ?Patient Visual Report: No change from baseline ?Vision Assessment?: No apparent visual deficits ?Perception  ?Perception: Within Functional Limits ?Praxis ?Praxis: Impaired ?Praxis Impairment Details: Motor planning ?Cognition ?Cognition ?Overall Cognitive Status: History of cognitive impairments - at baseline ?Arousal/Alertness: Awake/alert ?Orientation Level: Person;Place ?Memory: Impaired ?Memory Impairment: Decreased short term memory;Storage deficit;Retrieval deficit ?Decreased Short Term Memory: Verbal basic;Functional basic ?Attention: Focused;Sustained;Selective ?Sustained Attention: Appears intact ?Selective Attention: Impaired ?Awareness: Impaired ?Awareness Impairment: Intellectual impairment;Emergent impairment ?Problem Solving: Impaired ?Problem Solving Impairment: Verbal basic;Functional basic ?Safety/Judgment: Impaired ?Comments: Pt requires cues to self-correct posture, difficulty stating current impairments besides "weakness" ?Brief Interview for Mental  Status (BIMS) ?Repetition of Three Words (First Attempt): 3 ?Temporal Orientation: Year: Correct ?Temporal Orientation: Month: Accurate within 5 days ?Temporal Orientation: Day: Correct ?Recall: "Sock": Yes, no cue required ?Recall: "Blue": Yes, no cue required ?Recall: "Bed": No, could not recall ?BIMS Summary Score: 13 ?Sensation ?Sensation ?Light Touch: Impaired Detail ?Peripheral sensation comments: distal mild neuropathy on the RLE ?Light Touch Impaired Details: Impaired RLE ?Hot/Cold: Appears Intact ?Proprioception: Impaired Detail (posterior bias with difficulty correcting without cuing) ?Stereognosis: Appears Intact ?Coordination ?Gross Motor Movements are Fluid and Coordinated:  No ?Fine Motor Movements are Fluid and Coordinated: No ?Coordination and Movement Description: RUE/RLE ataxia, chronic HP, crouched posture and posterior bias ?Finger Nose Finger Test: mild RUE ataxia ?Motor  ?Motor ?Motor: Ataxia;Hemiplegia ?Motor - Discharge Observations: chronic mild R HP and ataxia, crouched posture with posterior bias in standing ?Mobility  ?Bed Mobility ?Bed Mobility: Supine to Sit ?Supine to Sit: Contact Guard/Touching assist (use of bed features) ?Transfers ?Sit to Stand: Minimal Assistance - Patient > 75% ?Stand to Sit: Contact Guard/Touching assist  ?Trunk/Postural Assessment  ?Cervical Assessment ?Cervical Assessment: Exceptions to [facility] (forward head) ?Thoracic Assessment ?Thoracic Assessment: Exceptions to [facility] (rounded shoulders) ?Lumbar Assessment ?Lumbar Assessment: Exceptions to [facility] (posterior pelvic tilt) ?Postural Control ?Postural Control: Deficits on evaluation (posterior bias in standing)  ?Balance ?Balance ?Balance Assessed: Yes ?Static Sitting Balance ?Static Sitting - Balance Support: Feet supported ?Static Sitting - Level of Assistance: 5: Stand by assistance ?Dynamic Sitting Balance ?Dynamic Sitting - Balance Support: Feet supported ?Dynamic Sitting - Level of Assistance: 5: Stand by  assistance;4: Min assist ?Static Standing Balance ?Static Standing - Balance Support: Bilateral upper extremity supported;During functional activity ?Static Standing - Level of Assistance: 4: Min assist ?Dynamic Standing Balance ?Dynamic Standing - Balance Support: Bilateral upper extremity supported ?Dynamic Standing - Level of Assistance: 4: Min assist ?Extremity/Trunk Assessment ?RUE Assessment ?RUE Assessment: Within Functional Limits ?General Strength Comments: chronic mild HP and ataxia but grossly WFL for BADL, 4/5 throughout ?LUE Assessment ?LUE Assessment: Within Functional Limits ?General Strength Comments: 5/5 ? ? ?Volanda Napoleon MS, OTR/L ? ?06/02/2021, 8:16 AM ?

## 2021-06-02 NOTE — Progress Notes (Signed)
Patient ID: [name], male   DOB: 1960-04-15, 61 y.o.   MRN: [id number] ? ?Patient set to discharge by PTAR at 10 AM on Friday  ?

## 2021-06-02 NOTE — Progress Notes (Signed)
Inpatient Rehabilitation Discharge Medication Review by a Pharmacist ? ?A complete drug regimen review was completed for this patient to identify any potential clinically significant medication issues. ? ?High Risk Drug Classes Is patient taking? Indication by Medication  ?Antipsychotic No   ?Anticoagulant Yes Apixaban for PE  ?Antibiotic No   ?Opioid Yes Tramadol for acute pain  ?Antiplatelet No   ?Hypoglycemics/insulin Yes Metformin for DM  ?Vasoactive Medication Yes Amlodipine for BP  ?Chemotherapy No   ?Other Yes Atorvastatin for HLD ?Keppra for seizures ?Flomax for BPH  ? ? ? ?Type of Medication Issue Identified Description of Issue Recommendation(s)  ?Drug Interaction(s) (clinically significant) ?    ?Duplicate Therapy ?    ?Allergy ?    ?No Medication Administration End Date ?    ?Incorrect Dose ?    ?Additional Drug Therapy Needed ?    ?Significant med changes from prior encounter (inform family/care partners about these prior to discharge).    ?Other ?    ? ? ?Clinically significant medication issues were identified that warrant physician communication and completion of prescribed/recommended actions by midnight of the next day:  No ? ?Pharmacist comments: Medications to TOC  ? ?Time spent performing this drug regimen review (minutes):  20 minutes ? ? ?Tad Moore ?06/02/2021 2:40 PM ?

## 2021-06-02 NOTE — Plan of Care (Signed)
?  Problem: RH Dressing ?Goal: LTG Patient will perform lower body dressing w/assist (OT) ?Description: LTG: Patient will perform lower body dressing with assist, with/without cues in positioning using equipment (OT) ?Outcome: Not Met (add Reason) ?Note: Requires up to total A for LBD due to fatigue, posterior bias, slow/rigid movements, and impaired gross motor coordination. ?  ?Problem: RH Toileting ?Goal: LTG Patient will perform toileting task (3/3 steps) with assistance level (OT) ?Description: LTG: Patient will perform toileting task (3/3 steps) with assistance level (OT)  ?Outcome: Not Met (add Reason) ?Note: Requires up to total A for toileting due to fatigue, posterior bias, slow/rigid movements, and impaired gross motor coordination. ?  ?Problem: RH Balance ?Goal: LTG: Patient will maintain dynamic sitting balance (OT) ?Description: LTG:  Patient will maintain dynamic sitting balance with assistance during activities of daily living (OT) ?Outcome: Completed/Met ?Goal: LTG Patient will maintain dynamic standing with ADLs (OT) ?Description: LTG:  Patient will maintain dynamic standing balance with assist during activities of daily living (OT)  ?Outcome: Completed/Met ?  ?Problem: Sit to Stand ?Goal: LTG:  Patient will perform sit to stand in prep for activites of daily living with assistance level (OT) ?Description: LTG:  Patient will perform sit to stand in prep for activites of daily living with assistance level (OT) ?Outcome: Completed/Met ?  ?Problem: RH Grooming ?Goal: LTG Patient will perform grooming w/assist,cues/equip (OT) ?Description: LTG: Patient will perform grooming with assist, with/without cues using equipment (OT) ?Outcome: Completed/Met ?  ?Problem: RH Bathing ?Goal: LTG Patient will bathe all body parts with assist levels (OT) ?Description: LTG: Patient will bathe all body parts with assist levels (OT) ?Outcome: Completed/Met ?  ?Problem: RH Dressing ?Goal: LTG Patient will perform upper  body dressing (OT) ?Description: LTG Patient will perform upper body dressing with assist, with/without cues (OT). ?Outcome: Completed/Met ?  ?Problem: RH Toilet Transfers ?Goal: LTG Patient will perform toilet transfers w/assist (OT) ?Description: LTG: Patient will perform toilet transfers with assist, with/without cues using equipment (OT) ?Outcome: Completed/Met ?  ?Problem: RH Tub/Shower Transfers ?Goal: LTG Patient will perform tub/shower transfers w/assist (OT) ?Description: LTG: Patient will perform tub/shower transfers with assist, with/without cues using equipment (OT) ?Outcome: Completed/Met ?  ?

## 2021-06-02 NOTE — Progress Notes (Signed)
Occupational Therapy Session Note ? ?Patient Details  ?Name: [name] ?MRN: [id number] ?Date of Birth: Oct 08, 1960 ? ?Today's Date: 06/02/2021 ?OT Individual Time: 4193-7902 ?OT Individual Time Calculation (min): 76 min  ? ? ?Short Term Goals: ?Week 4:  OT Short Term Goal 1 (Week 4): STG = LTG 2/2 ELOS ? ?Skilled Therapeutic Interventions/Progress Updates:  ?  Pt received asleep in bed but easily awakened to touch, denies pain and, agreeable to therapy. Session focus on self-care retraining, activity tolerance, transfer retraining in prep for improved ADL/IADL/func mobility performance + decreased caregiver burden. ? ?Came to sitting EOB with CGA and heavy use of bed features. Total A to don/doff R darco/L shoe/socks throughout session. Ambulated > shower seat with heavy min A to power up and light min A for balance and RW management during ambulation. Reports feeling light headed, but resolves when seated on shower stool. ? ?S to doff shirt, assist to doff brief. Bathed UB with close S, LB with overall min A to bathe buttocks and reach feet. Stedy transfer > recliner due to pt fatigue/feeling light headed post earlier ambulation. Applied deodorant seated in recliner with S, donned shirt with min A to pull shirt over his R shoulder. Total A for LBD, able to maintain standing with heavy min A to overcome posterior bias, able to stand for ~ 1 min at a time before fatiguing.  MD in/out morning assessment.  ? ?Set-up A for breakfast, elected to self-feed with LUE with use of built up handle on utensil.  ? ?Pt left seated in recliner with safety belt alarm engaged, call bell in reach, and all immediate needs met.  ? ? ?Therapy Documentation ?Precautions:  ?Precautions ?Precautions: Fall ?Precaution Comments: baseline R HP and cognitive deficits ?Restrictions ?Weight Bearing Restrictions: Yes ?RLE Weight Bearing: Partial weight bearing ?Other Position/Activity Restrictions: heel WB with surgical shoe ? ? Pain:  denies ?ADL: See Care Tool for more details. ?Therapy/Group: Individual Therapy ? ?Volanda Napoleon MS, OTR/L ? ?06/02/2021, 6:54 AM ?

## 2021-06-02 NOTE — Progress Notes (Signed)
Patient ID: [name], male   DOB: 05-Dec-1960, 61 y.o.   MRN: [id number] ? ?Sw called patient sister, [name] this morning to follow up on the status of the birthday on patients Medicare card. Patient sister reports she was unable to speak with someone via telephone on yesterday. [name] reports that the patients brother, [name] who is on the account at social security is currently sick and unable to drive to the office. Sw informed patient sister, [name] that [name] would need to call social security to clarify the status of patients Medicare before we can continue with the SNF process. Sw informed patient sister that this will be needed to be complete ASAP. Due to the patient being unable to sit on the unit and we will soon have to plan for a discharge home if not addressed. No additional questions or concerns. ?

## 2021-06-03 ENCOUNTER — Encounter: Payer: Self-pay | Admitting: Urology

## 2021-06-03 DIAGNOSIS — G4700 Insomnia, unspecified: Secondary | ICD-10-CM

## 2021-06-03 DIAGNOSIS — K5900 Constipation, unspecified: Secondary | ICD-10-CM

## 2021-06-03 LAB — GLUCOSE, CAPILLARY: Glucose-Capillary: 76 mg/dL (ref 70–99)

## 2021-06-03 MED ORDER — TAMSULOSIN HCL 0.4 MG PO CAPS: 0.4000 mg | ORAL_CAPSULE | Freq: Every day | ORAL | 0 refills | Status: DC

## 2021-06-03 MED ORDER — MELATONIN 5 MG PO TABS: 5.0000 mg | ORAL_TABLET | Freq: Every day | ORAL | 0 refills | Status: DC

## 2021-06-03 MED ORDER — APIXABAN 5 MG PO TABS
5.0000 mg | ORAL_TABLET | Freq: Two times a day (BID) | ORAL | 0 refills | Status: DC
Start: 2021-06-03 — End: 2021-12-23

## 2021-06-03 MED ORDER — ASPIRIN 81 MG PO TBEC: 81.0000 mg | DELAYED_RELEASE_TABLET | Freq: Every day | ORAL | 0 refills | Status: DC

## 2021-06-03 MED ORDER — SENNA 8.6 MG PO TABS: 1.0000 | ORAL_TABLET | Freq: Two times a day (BID) | ORAL | 0 refills | Status: DC

## 2021-06-03 MED ORDER — MICONAZOLE NITRATE 2 % EX CREA: TOPICAL_CREAM | Freq: Two times a day (BID) | CUTANEOUS | 0 refills | Status: DC

## 2021-06-03 MED ORDER — METFORMIN HCL 500 MG PO TABS: 500.0000 mg | ORAL_TABLET | Freq: Two times a day (BID) | ORAL | Status: DC

## 2021-06-03 MED ORDER — LEVETIRACETAM 500 MG PO TABS: 500.0000 mg | ORAL_TABLET | Freq: Two times a day (BID) | ORAL | 0 refills | Status: DC

## 2021-06-03 MED ORDER — AMLODIPINE BESYLATE 10 MG PO TABS: 10.0000 mg | ORAL_TABLET | Freq: Every day | ORAL | 0 refills | Status: DC

## 2021-06-03 MED ORDER — BETHANECHOL CHLORIDE 10 MG PO TABS: 10.0000 mg | ORAL_TABLET | Freq: Three times a day (TID) | ORAL | 0 refills | Status: DC

## 2021-06-03 MED ORDER — ATORVASTATIN CALCIUM 40 MG PO TABS: 40.0000 mg | ORAL_TABLET | Freq: Every day | ORAL | 0 refills | Status: DC

## 2021-06-03 NOTE — Progress Notes (Signed)
[facility] PHYSICAL MEDICINE & REHABILITATION PROGRESS NOTE ? ?Subjective/Complaints: ?Sitting in chair. Ate all his breakfast. Reports sleep improved.  ? ? ?ROS: Denies CP, SOB, N/V/D. Denies constipation. Denies pain. ? ? ?Objective: ?Vital Signs: ?Blood pressure 114/71, pulse 64, temperature 98.4 ?F (36.9 ?C), resp. rate 16, height 5\' 7"  (1.702 m), weight 71.2 kg, SpO2 100 %. ?No results found. ?No results for input(s): WBC, HGB, HCT, PLT in the last 72 hours. ? ? ?No results for input(s): NA, K, CL, CO2, GLUCOSE, BUN, CREATININE, CALCIUM in the last 72 hours. ? ? ?Intake/Output Summary (Last 24 hours) at 06/03/2021 0912 ?Last data filed at 06/03/2021 0813 ?Gross per 24 hour  ?Intake 600 ml  ?Output --  ?Net 600 ml  ? ?  ? ?Pressure Injury 05/28/21 Buttocks Stage 2 -  Partial thickness loss of dermis presenting as a shallow open injury with a red, pink wound bed without slough. (Active)  ?05/28/21 1837  ?Location: Buttocks  ?Location Orientation:   ?Staging: Stage 2 -  Partial thickness loss of dermis presenting as a shallow open injury with a red, pink wound bed without slough.  ?Wound Description (Comments):   ?Present on Admission: -- (unknown)  ?Dressing Type Foam - Lift dressing to assess site every shift 06/03/21 0812  ? ? ?Physical Exam: ?BP 114/71 (BP Location: Left Arm)   Pulse 64   Temp 98.4 ?F (36.9 ?C)   Resp 16   Ht 5\' 7"  (1.702 m)   Wt 71.2 kg   SpO2 100%   BMI 24.58 kg/m?  ? ?General: No acute distress, sitting in chair ?Mood and affect are appropriate ?Heart: Regular rate and rhythm no rubs murmurs or extra sounds ?Lungs: Clear to auscultation ?Abdomen: Positive bowel sounds, soft nontender to palpation, nondistended ?Extremities: No clubbing, cyanosis, or edema ?Skin warm and dry ? ?R foot  post op shoe ? ? ?4/24 ? ? ? ? ? ?Looks identical on 5/10 ?Psych: Pleasant ?  ?Neuro: 4-/5 throughout ? ?Hand intrinsic atrophy with reduced sensation in 5th fingers bilateral , also reduced sensation  feet to LT , ? ?Assessment/Plan: ?1. Functional deficits which require 3+ hours per day of interdisciplinary therapy in a comprehensive inpatient rehab setting. ?Physiatrist is providing close team supervision and 24 hour management of active medical problems listed below. ?Physiatrist and rehab team continue to assess barriers to discharge/monitor patient progress toward functional and medical goals ? ? ?Care Tool: ? ?Bathing ?   ?Body parts bathed by patient: Face, Right arm, Left arm, Chest, Abdomen, Right lower leg, Left lower leg, Front perineal area, Right upper leg, Left upper leg  ? Body parts bathed by helper: Buttocks, Right lower leg, Left lower leg ?  ?  ?Bathing assist Assist Level: Minimal Assistance - Patient > 75% ?  ?  ?Upper Body Dressing/Undressing ?Upper body dressing   ?What is the patient wearing?: Pull over shirt ?   ?Upper body assist Assist Level: Minimal Assistance - Patient > 75% ?   ?Lower Body Dressing/Undressing ?Lower body dressing ? ? ?   ?What is the patient wearing?: Pants ? ?  ? ?Lower body assist Assist for lower body dressing: Total Assistance - Patient < 25% ?   ? ?Toileting ?Toileting Toileting Activity did not occur (Probation officer and hygiene only): N/A (no void or bm)  ?Toileting assist Assist for toileting: Maximal Assistance - Patient 25 - 49% ?  ?  ?Transfers ?Chair/bed transfer ? ?Transfers assist ?   ? ?Chair/bed transfer  assist level: Minimal Assistance - Patient > 75% ?  ?  ?Locomotion ?Ambulation ? ? ?Ambulation assist ? ?   ? ?Assist level: Minimal Assistance - Patient > 75% ?Assistive device: Walker-rolling ?Max distance: 150  ? ?Walk 10 feet activity ? ? ?Assist ?   ? ?Assist level: Minimal Assistance - Patient > 75% ?Assistive device: Walker-rolling  ? ?Walk 50 feet activity ? ? ?Assist Walk 50 feet with 2 turns activity did not occur: Safety/medical concerns ? ?Assist level: Minimal Assistance - Patient > 75% ?Assistive device: Walker-rolling  ? ? ?Walk 150  feet activity ? ? ?Assist Walk 150 feet activity did not occur: Safety/medical concerns ? ?Assist level: Minimal Assistance - Patient > 75% ?Assistive device: Walker-rolling ?  ? ?Walk 10 feet on uneven surface  ?activity ? ? ?Assist Walk 10 feet on uneven surfaces activity did not occur: Safety/medical concerns ? ? ?Assist level: Minimal Assistance - Patient > 75% ?   ? ?Wheelchair ? ? ? ? ?Assist Is the patient using a wheelchair?: Yes ?Type of Wheelchair: Manual ?  ? ?Wheelchair assist level: Supervision/Verbal cueing ?Max wheelchair distance: 100  ? ? ?Wheelchair 50 feet with 2 turns activity ? ? ? ?Assist ? ?  ?  ? ? ?Assist Level: Supervision/Verbal cueing  ? ?Wheelchair 150 feet activity  ? ? ? ?Assist ?   ? ? ?Assist Level: Minimal Assistance - Patient > 75%  ? ? ?Medical Problem List and Plan: ?1. Functional deficits secondary to peripheral arterial disease, RIght 1st ray, 2,3,4 toe amps due to osteomyelitis with pop art stenting Right side surgery and acute pulmonary embolus. ?Continue CIR PT, OT -plan is SNF ?SNF placement pending ?2.  Antithrombotics: ?-DVT/anticoagulation:  Pharmaceutical: had PE now on Eliquis x 5mo - no sign of surgical site bleeding  ?            -antiplatelet therapy: Aspirin 81 mg daily ?3. Pain Management: Tylenol, tramadol as needed ? -Monitor with increased exertion ? -5/11 Rare use of tramadol, continue for now, consider discontinuation ?4. Onchomycosis of left toes: antifungal cream ordered BID.  ?5. Neuropsych: This patient is capable of making decisions on his own behalf. ?6. Right foot incision: ?      healed no dressing needed ?7. Fluids/Electrolytes/Nutrition: Routine I's and O's ?            -- Carb modified diet ?8: PAD: Status post Right popliteal stent placement ?            -- Continue aspirin while on Eliquis.  Stop Plavix. ?            -- Follow-up with Dr. Lucky Cowboy with ABIs ?            -- Status post right partial first ray amputation  ?            -- Right heel  weightbearing with postop shoe- trial of darco sandal PT evaluating use  ?No swelling no  ?9: prior CVA: ?10: Dyslipidemia: Continue Lipitor 40 mg daily,discussed healthy diet ?11: Acute PE: Continue Eliquis ?12: Hypertension: Continue Norvasc 10 mg daily ?Vitals:  ? 06/02/21 2009 06/03/21 0527  ?BP: 119/70 114/71  ?Pulse: 71 64  ?Resp: 18 16  ?Temp: (!) 97.5 ?F (36.4 ?C) 98.4 ?F (36.9 ?C)  ?SpO2: 100% 100%  ? Controlled 5/9 ? ?13: DM type II with peripheral neuropathy  Increase metformin to 500mg  BID  ? CBG (last 3) will order , pt states he has  no CBG monitor at home  ?Recent Labs  ?  06/02/21 ?1653 06/02/21 ?2446 06/03/21 ?9507  ?GLUCAP 136* 131* 76  ? ?5/12 CBG continues to be well controlled, continue metformin, discussed healthy diet ? ?14: Seizure disorder maintained on Keppra: Continue 500 mg daily, ? If PCP managed this in past  ?15: Constipation: last BM 5/6- needs supp today  ? -5/12 Improved, last BM yesterday, continue senna and miralax ?         ?16: Urinary retention: d/c foley ?           no cath since 5/5 , cont flomax and urecholine  ?17: Chronic diastolic congestive heart failure, euvolemic, no diuretics ?            -- Grade 1 diastolic dysfunction on recent echo ?18.  Hyponatremia ? Na improved to 134, monitor weekly ?19.  Bradykinesia , masked facies, min cogwheeling, no hx PD , no showed for appt with  Dr Jaynee Eagles- would rec f/u post hospitalization   ?Hx of remote RIght subcortical infarcts which may cause a parkinson like syndrome  , seizure d/o ?20.  Fever up to 103 F 5/2 ,  neg cx result  but was on keflex x 2 d when cx taken , abx regimen modified with addition of vanc to cover gm + , No clinical evidence of PNA or wound infection  ?Xray foot showing osseous irregularity , MRI neg for infection ,  ?ID has signed off ?21.  UTI resolved  ?22. Insomnia ? -5/12 improved with higher dose melatonin ?LOS: 28 days ?A FACE TO FACE EVALUATION WAS PERFORMED ? ?Jennye Boroughs ?06/03/2021, 9:12 AM ? ?

## 2021-06-03 NOTE — Progress Notes (Signed)
Waiting for PTAR  for patient discharged this shift  to [facility] SNF. Report given to Janett Billow, nurse. Will continue to monitor patient. ?

## 2021-06-03 NOTE — Progress Notes (Signed)
Physical Therapy Discharge Summary ? ?Patient Details  ?Name: [name] ?MRN: [id number] ?Date of Birth: 11/13/1960 ? ?Today's Date: 06/03/2021 ? ? ? ?Patient has met 8 of 10 long term goals due to improved activity tolerance, improved balance, improved postural control, increased strength, increased range of motion, decreased pain, ability to compensate for deficits, improved attention, improved awareness, and improved coordination.  Patient to discharge at an ambulatory level Vivian.   Patient's care partner unavailable to provide the necessary physical and cognitive assistance at discharge. ? ?Reasons goals not met: continued motor and endurance deficits require d/c to SNF  ? ?Recommendation:  ?Patient will benefit from ongoing skilled PT services in skilled nursing facility setting to continue to advance safe functional mobility, address ongoing impairments in balance, endurance, motor, strength, initiation, and minimize fall risk. ? ?Equipment: ?No equipment provided ? ?Reasons for discharge: treatment goals met and discharge from hospital ? ?Patient/family agrees with progress made and goals achieved: Yes ? ?PT Discharge ?Precautions/Restrictions ?Precautions ?Precautions: Fall ?Precaution Comments: baseline R HP and cognitive deficits ?Restrictions ?Weight Bearing Restrictions: Yes ?RLE Weight Bearing: Partial weight bearing ?Other Position/Activity Restrictions: heel WB with surgical shoe ? ?  ?Pain ?  0/10 ?Pain Interference ?Pain Interference ?Pain Effect on Sleep: 1. Rarely or not at all ?Pain Interference with Therapy Activities: 1. Rarely or not at all ?Pain Interference with Day-to-Day Activities: 1. Rarely or not at all ?Vision/Perception  ?Vision - History ?Ability to See in Adequate Light: 1 Impaired ?Perception ?Perception: Within Functional Limits ?Praxis ?Praxis: Impaired ?Praxis Impairment Details: Motor planning;Initiation  ? ?Sensation ?Sensation ?Light Touch: Impaired Detail ?Peripheral  sensation comments: distal mild neuropathy on the RLE ?Light Touch Impaired Details: Impaired RLE ?Hot/Cold: Appears Intact ?Proprioception: Impaired Detail (posterior bias with difficulty correcting without cuing) ?Stereognosis: Appears Intact ?Coordination ?Gross Motor Movements are Fluid and Coordinated: No ?Fine Motor Movements are Fluid and Coordinated: No ?Coordination and Movement Description: RUE/RLE ataxia, chronic HP, crouched posture and posterior bias ?Finger Nose Finger Test: mild RUE ataxia ?Heel Shin Test: ataxia on the RLE ?Motor  ?Motor ?Motor: Ataxia;Hemiplegia ?Motor - Discharge Observations: chronic mild R HP and ataxia, crouched posture with posterior bias in standing  ?Mobility ?Bed Mobility ?Bed Mobility: Supine to Sit;Rolling Right;Rolling Left;Sit to Supine ?Rolling Right: Contact Guard/Touching assist ?Rolling Left: Contact Guard/Touching assist ?Supine to Sit: Contact Guard/Touching assist ?Sit to Supine: Contact Guard/Touching assist ?Transfers ?Transfers: Stand to [employer];Sit to Stand ?Sit to Stand: Minimal Assistance - Patient > 75% ?Stand to Sit: Minimal Assistance - Patient > 75% ?Stand Pivot Transfers: Minimal Assistance - Patient > 75% ?Transfer (Assistive device): Rolling walker ?Locomotion  ?Gait ?Ambulation: Yes ?Gait Assistance: Contact Guard/Touching assist;Minimal Assistance - Patient > 75% ?Gait Distance (Feet): 150 Feet ?Assistive device: Rolling walker ?Gait ?Gait: Yes ?Gait Pattern: Ataxic;Narrow base of support;Trunk flexed ?Stairs / Additional Locomotion ?Stairs: Yes ?Stairs Assistance: Minimal Assistance - Patient > 75% ?Stair Management Technique: Two rails ?Number of Stairs: 4 ?Height of Stairs: 6 ?Wheelchair Mobility ?Wheelchair Mobility: Yes ?Wheelchair Assistance: Supervision/Verbal cueing ?Wheelchair Propulsion: Both upper extremities ?Wheelchair Parts Management: Needs assistance ?Distance: 100  ?Trunk/Postural Assessment  ?Cervical  Assessment ?Cervical Assessment: Exceptions to [facility] (forward head) ?Thoracic Assessment ?Thoracic Assessment: Exceptions to [facility] (rounded shoulders) ?Lumbar Assessment ?Lumbar Assessment: Exceptions to [facility] (posterior pelvic tilt) ?Postural Control ?Postural Control: Deficits on evaluation (posterior bias in standing)  ?Balance ?Balance ?Balance Assessed: Yes ?Dynamic Sitting Balance ?Dynamic Sitting - Level of Assistance: 5: Stand by assistance ?Static Standing Balance ?Static  Standing - Level of Assistance: 4: Min assist;5: Stand by assistance ?Dynamic Standing Balance ?Dynamic Standing - Level of Assistance: 5: Stand by assistance;4: Min assist ?Extremity Assessment  ?  ?  ?RLE Assessment ?RLE Assessment: Exceptions to [facility] ?Active Range of Motion (AROM) Comments: lacking 10deg full knee extension ?General Strength Comments: grossly 4-/5 proximal to distal ?LLE Assessment ?LLE Assessment: Exceptions to [facility] ?General Strength Comments: 4-/5 proximal to distal ? ? ? ?[name] ?06/03/2021, 1:02 PM ?

## 2021-06-03 NOTE — Plan of Care (Signed)
?  Problem: RH Balance ?Goal: LTG Patient will maintain dynamic standing balance (PT) ?Description: LTG:  Patient will maintain dynamic standing balance with assistance during mobility activities (PT) ?Outcome: Not Met (add Reason) ?Note: D/c to SNF due to endurance and motor deficits. ?  ?Problem: RH Stairs ?Goal: LTG Patient will ambulate up and down stairs w/assist (PT) ?Description: LTG: Patient will ambulate up and down # of stairs with assistance (PT) ?Outcome: Not Met (add Reason) ?Note: D/c to SNF due to endurance and motor deficits. ?  ?Problem: RH Balance ?Goal: LTG Patient will maintain dynamic sitting balance (PT) ?Description: LTG:  Patient will maintain dynamic sitting balance with assistance during mobility activities (PT) ?Outcome: Completed/Met ?  ?Problem: RH Bed Mobility ?Goal: LTG Patient will perform bed mobility with assist (PT) ?Description: LTG: Patient will perform bed mobility with assistance, with/without cues (PT). ?Outcome: Completed/Met ?  ?Problem: RH Bed to Chair Transfers ?Goal: LTG Patient will perform bed/chair transfers w/assist (PT) ?Description: LTG: Patient will perform bed to chair transfers with assistance (PT). ?Outcome: Completed/Met ?  ?Problem: RH Car Transfers ?Goal: LTG Patient will perform car transfers with assist (PT) ?Description: LTG: Patient will perform car transfers with assistance (PT). ?Outcome: Completed/Met ?  ?Problem: RH Ambulation ?Goal: LTG Patient will ambulate in controlled environment (PT) ?Description: LTG: Patient will ambulate in a controlled environment, # of feet with assistance (PT). ?Outcome: Completed/Met ?Goal: LTG Patient will ambulate in home environment (PT) ?Description: LTG: Patient will ambulate in home environment, # of feet with assistance (PT). ?Outcome: Completed/Met ?  ?Problem: RH Wheelchair Mobility ?Goal: LTG Patient will propel w/c in controlled environment (PT) ?Description: LTG: Patient will propel wheelchair in controlled  environment, # of feet with assist (PT) ?Outcome: Completed/Met ?  ?

## 2021-06-12 ENCOUNTER — Emergency Department
Admission: EM | Admit: 2021-06-12 | Discharge: 2021-06-13 | Disposition: A | Discharge status: Skilled Nursing Facility | Payer: Medicare Other | Attending: Emergency Medicine | Admitting: Emergency Medicine

## 2021-06-12 DIAGNOSIS — E119 Type 2 diabetes mellitus without complications: Secondary | ICD-10-CM | POA: Diagnosis not present

## 2021-06-12 DIAGNOSIS — G4710 Hypersomnia, unspecified: Secondary | ICD-10-CM | POA: Diagnosis present

## 2021-06-12 DIAGNOSIS — R5381 Other malaise: Secondary | ICD-10-CM

## 2021-06-12 DIAGNOSIS — Z79899 Other long term (current) drug therapy: Secondary | ICD-10-CM | POA: Insufficient documentation

## 2021-06-12 DIAGNOSIS — Z7901 Long term (current) use of anticoagulants: Secondary | ICD-10-CM | POA: Diagnosis not present

## 2021-06-12 DIAGNOSIS — Z7982 Long term (current) use of aspirin: Secondary | ICD-10-CM | POA: Insufficient documentation

## 2021-06-12 DIAGNOSIS — I10 Essential (primary) hypertension: Secondary | ICD-10-CM | POA: Insufficient documentation

## 2021-06-12 LAB — CBC
HCT: 36.7 % — ABNORMAL LOW (ref 39.0–52.0)
Hemoglobin: 11.6 g/dL — ABNORMAL LOW (ref 13.0–17.0)
MCH: 28.5 pg (ref 26.0–34.0)
MCHC: 31.6 g/dL (ref 30.0–36.0)
MCV: 90.2 fL (ref 80.0–100.0)
Platelets: 212 10*3/uL (ref 150–400)
RBC: 4.07 MIL/uL — ABNORMAL LOW (ref 4.22–5.81)
RDW: 14.1 % (ref 11.5–15.5)
WBC: 4.6 10*3/uL (ref 4.0–10.5)
nRBC: 0 % (ref 0.0–0.2)

## 2021-06-12 LAB — BASIC METABOLIC PANEL
Anion gap: 9 (ref 5–15)
BUN: 20 mg/dL (ref 6–20)
CO2: 23 mmol/L (ref 22–32)
Calcium: 9.1 mg/dL (ref 8.9–10.3)
Chloride: 102 mmol/L (ref 98–111)
Creatinine, Ser: 0.76 mg/dL (ref 0.61–1.24)
GFR, Estimated: >60 mL/min (ref >60)
Glucose, Bld: 112 mg/dL — ABNORMAL HIGH (ref 70–99)
Potassium: 4.1 mmol/L (ref 3.5–5.1)
Sodium: 134 mmol/L — ABNORMAL LOW (ref 135–145)

## 2021-06-12 NOTE — ED Notes (Signed)
Pt placed on stretcher in triage area, brief dry at this time, family at bedside.

## 2021-06-12 NOTE — ED Triage Notes (Signed)
Pt comes ems from [facility]. Family concerned that pt has been sleeping too much. Pt does not have any complaints. Pt is oriented.

## 2021-06-12 NOTE — ED Notes (Signed)
Pt resting comfortably in triage on stretcher. VS obtained. Brief dry at this time, bed alarm on and stretcher in lowest position.

## 2021-06-13 ENCOUNTER — Ambulatory Visit: Payer: Medicaid Other | Admitting: Cardiovascular Disease

## 2021-06-13 LAB — URINALYSIS, ROUTINE W REFLEX MICROSCOPIC
Bilirubin Urine: NEGATIVE
Glucose, UA: NEGATIVE mg/dL
Hgb urine dipstick: NEGATIVE
Ketones, ur: NEGATIVE mg/dL
Leukocytes,Ua: NEGATIVE
Nitrite: NEGATIVE
Protein, ur: NEGATIVE mg/dL
Specific Gravity, Urine: 1.017 (ref 1.005–1.030)
pH: 5 (ref 5.0–8.0)

## 2021-06-13 LAB — PROCALCITONIN: Procalcitonin: <0.1 ng/mL

## 2021-06-13 LAB — TROPONIN I (HIGH SENSITIVITY): Troponin I (High Sensitivity): 5 ng/L (ref <18)

## 2021-06-13 LAB — TSH: TSH: 1.253 u[IU]/mL (ref 0.350–4.500)

## 2021-06-13 NOTE — ED Notes (Signed)
This RN attempted x 3 to call [facility] for report, no answer at this time.
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
## 2021-06-13 NOTE — ED Notes (Addendum)
This RN attempted 2 more times to call report to [facility].

## 2021-06-13 NOTE — ED Notes (Signed)
This RN attempted x 4 to call report to [facility], no answer.
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
## 2021-06-13 NOTE — ED Notes (Signed)
This RN attempted 2 more times to call report to [facility]. No answer at this time.

## 2021-06-13 NOTE — ED Notes (Signed)
[facility] called to transport pt to [facility].

## 2021-06-14 ENCOUNTER — Ambulatory Visit (INDEPENDENT_AMBULATORY_CARE_PROVIDER_SITE_OTHER): Payer: MEDICAID | Admitting: Vascular Surgery

## 2021-07-01 ENCOUNTER — Ambulatory Visit (INDEPENDENT_AMBULATORY_CARE_PROVIDER_SITE_OTHER): Payer: Medicare Other | Admitting: Vascular Surgery

## 2021-07-01 ENCOUNTER — Encounter (INDEPENDENT_AMBULATORY_CARE_PROVIDER_SITE_OTHER): Payer: Self-pay | Admitting: Vascular Surgery

## 2021-07-01 VITALS — BP 120/74 | HR 74 | Resp 18

## 2021-07-01 DIAGNOSIS — I2699 Other pulmonary embolism without acute cor pulmonale: Secondary | ICD-10-CM

## 2021-07-01 DIAGNOSIS — I7025 Atherosclerosis of native arteries of other extremities with ulceration: Secondary | ICD-10-CM | POA: Diagnosis not present

## 2021-07-01 DIAGNOSIS — E1165 Type 2 diabetes mellitus with hyperglycemia: Secondary | ICD-10-CM | POA: Diagnosis not present

## 2021-07-01 DIAGNOSIS — I10 Essential (primary) hypertension: Secondary | ICD-10-CM | POA: Diagnosis not present

## 2021-07-01 NOTE — Progress Notes (Signed)
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 samples taken after fasting for at least 8 hours.  Glucose, capillary     Status: Abnormal   Collection Time: 04/25/21 11:35  AM  Result Value Ref Range   Glucose-Capillary 235 (H) 70 - 99 mg/dL  Comment: Glucose reference range applies only to samples taken after fasting for at least 8 hours.  Glucose, capillary     Status: Abnormal   Collection Time: 04/25/21  4:20 PM  Result Value Ref Range   Glucose-Capillary 137 (H) 70 - 99 mg/dL    Comment: Glucose reference range applies only to samples taken after fasting for at least 8 hours.  Glucose, capillary     Status: Abnormal   Collection Time: 04/25/21  9:31 PM  Result Value Ref Range   Glucose-Capillary 195 (H) 70 - 99 mg/dL    Comment: Glucose reference range applies only to samples taken after fasting for at least 8 hours.  CBC     Status: None   Collection Time: 04/26/21  5:08 AM  Result Value Ref Range   WBC 5.9 4.0 - 10.5 K/uL   RBC 4.85 4.22 - 5.81 MIL/uL   Hemoglobin 14.5 13.0 - 17.0 g/dL   HCT 78.0 22.1 - 94.8 %   MCV 86.2 80.0 - 100.0 fL   MCH 29.9 26.0 - 34.0 pg   MCHC 34.7 30.0 - 36.0 g/dL   RDW 08.2 39.8 - 83.0 %   Platelets 194 150 - 400 K/uL   nRBC 0.0 0.0 - 0.2 %    Comment: Performed at [facility] [address], [place], [place] [address]  Basic metabolic panel     Status: Abnormal   Collection Time: 04/26/21  5:08 AM  Result Value Ref Range   Sodium 133 (L) 135 - 145 mmol/L   Potassium 3.9 3.5 - 5.1 mmol/L   Chloride 102 98 - 111 mmol/L   CO2 23 22 - 32 mmol/L   Glucose, Bld 124 (H) 70 - 99 mg/dL    Comment: Glucose reference range applies only to samples taken after fasting for at least 8 hours.   BUN 15 6 - 20 mg/dL   Creatinine, Ser 9.97 0.61 - 1.24 mg/dL   Calcium 8.6 (L) 8.9 - 10.3 mg/dL   GFR, Estimated >16 >74 mL/min    Comment: (NOTE) Calculated using the CKD-EPI Creatinine Equation (2021)    Anion gap 8 5 - 15    Comment: Performed at [facility], [address], [place], [place] [address]  Magnesium     Status: None   Collection Time: 04/26/21  5:08 AM  Result Value Ref Range   Magnesium  2.0 1.7 - 2.4 mg/dL    Comment: Performed at [facility], [address] [place], [place] [address]  Phosphorus     Status: None   Collection Time: 04/26/21  5:08 AM  Result Value Ref Range   Phosphorus 3.0 2.5 - 4.6 mg/dL    Comment: Performed at [facility], [address] [place], [place] [address]  Heparin level (unfractionated)     Status: Abnormal   Collection Time: 04/26/21  5:08 AM  Result Value Ref Range   Heparin Unfractionated >1.10 (H) 0.30 - 0.70 IU/mL    Comment: (NOTE) The clinical reportable range upper limit is being lowered to >1.10 to align with the FDA approved guidance for the current laboratory assay.  If heparin results are below expected values, and patient dosage has  been confirmed, suggest follow up testing of antithrombin III levels. Performed at [facility], [address] [place], [place] [address]   Glucose, capillary     Status: Abnormal   Collection Time: 04/26/21  7:24 AM  Result Value Ref Range  Glucose-Capillary 115 (H) 70 - 99 mg/dL    Comment: Glucose reference range applies only to samples taken after fasting for at least 8 hours.  ECHOCARDIOGRAM COMPLETE     Status: None   Collection Time: 04/26/21  9:26 AM  Result Value Ref Range   Weight 2,542.4 oz   Height 67 in   BP 146/83 mmHg   Ao pk vel 1.24 m/s   AV Area VTI 2.91 cm2   AR max vel 3.08 cm2   AV Mean grad 3.3 mmHg   AV Peak grad 6.1 mmHg   S' Lateral 3.10 cm   AV Area mean vel 3.06 cm2   Area-P 1/2 2.92 cm2   MV VTI 2.62 cm2  Glucose, capillary     Status: Abnormal   Collection Time: 04/26/21 11:45 AM  Result Value Ref Range   Glucose-Capillary 112 (H) 70 - 99 mg/dL    Comment: Glucose reference range applies only to samples taken after fasting for at least 8 hours.  Glucose, capillary     Status: None   Collection Time: 04/26/21  4:35 PM  Result Value Ref Range   Glucose-Capillary 96 70 - 99 mg/dL    Comment: Glucose reference range  applies only to samples taken after fasting for at least 8 hours.  Glucose, capillary     Status: Abnormal   Collection Time: 04/26/21  8:36 PM  Result Value Ref Range   Glucose-Capillary 161 (H) 70 - 99 mg/dL    Comment: Glucose reference range applies only to samples taken after fasting for at least 8 hours.  CBC     Status: None   Collection Time: 04/27/21  4:20 AM  Result Value Ref Range   WBC 5.2 4.0 - 10.5 K/uL   RBC 4.83 4.22 - 5.81 MIL/uL   Hemoglobin 14.0 13.0 - 17.0 g/dL   HCT 68.6 48.5 - 98.4 %   MCV 87.0 80.0 - 100.0 fL   MCH 29.0 26.0 - 34.0 pg   MCHC 33.3 30.0 - 36.0 g/dL   RDW 88.5 84.1 - 86.7 %   Platelets 202 150 - 400 K/uL   nRBC 0.0 0.0 - 0.2 %    Comment: Performed at [facility], [address] [place], [place] [address]  Basic metabolic panel     Status: Abnormal   Collection Time: 04/27/21  4:20 AM  Result Value Ref Range   Sodium 134 (L) 135 - 145 mmol/L   Potassium 3.6 3.5 - 5.1 mmol/L   Chloride 101 98 - 111 mmol/L   CO2 26 22 - 32 mmol/L   Glucose, Bld 102 (H) 70 - 99 mg/dL    Comment: Glucose reference range applies only to samples taken after fasting for at least 8 hours.   BUN 14 6 - 20 mg/dL   Creatinine, Ser 2.82 0.61 - 1.24 mg/dL   Calcium 8.8 (L) 8.9 - 10.3 mg/dL   GFR, Estimated >05 >41 mL/min    Comment: (NOTE) Calculated using the CKD-EPI Creatinine Equation (2021)    Anion gap 7 5 - 15    Comment: Performed at [facility], [address] [place], [place] [address]  Magnesium     Status: None   Collection Time: 04/27/21  4:20 AM  Result Value Ref Range   Magnesium 2.0 1.7 - 2.4 mg/dL    Comment: Performed at [facility], [address], [place], [place] [address]  Phosphorus     Status: None  Collection Time: 04/27/21  4:20 AM  Result Value Ref Range   Phosphorus 3.8 2.5 - 4.6 mg/dL    Comment: Performed at [facility], [address] [place], [place] [address]  Glucose, capillary      Status: None   Collection Time: 04/27/21  8:28 AM  Result Value Ref Range   Glucose-Capillary 92 70 - 99 mg/dL    Comment: Glucose reference range applies only to samples taken after fasting for at least 8 hours.  Glucose, capillary     Status: Abnormal   Collection Time: 04/27/21 12:38 PM  Result Value Ref Range   Glucose-Capillary 104 (H) 70 - 99 mg/dL    Comment: Glucose reference range applies only to samples taken after fasting for at least 8 hours.  Aerobic/Anaerobic Culture w Gram Stain (surgical/deep wound)     Status: None   Collection Time: 04/27/21  1:47 PM   Specimen: Abscess; Wound  Result Value Ref Range   Specimen Description      ABSCESS Performed at [facility], [address] [place], [place] [address]    Special Requests      RIGHT FOOT Performed at [facility], [place], [place] [address]    Gram Stain      NO SQUAMOUS EPITHELIAL CELLS SEEN FEW WBC SEEN FEW GRAM POSITIVE RODS MODERATE GRAM NEGATIVE RODS MODERATE GRAM POSITIVE COCCI    Culture      NORMAL SKIN FLORA AEROBICALLY MODERATE Campylobacter ureolyticus MODERATE PEPTONIPHILUS SPECIES Performed at [facility], [place] [address], [place], [place] [address]    Report Status 04/29/2021 FINAL   Glucose, capillary     Status: Abnormal   Collection Time: 04/27/21  7:42 PM  Result Value Ref Range   Glucose-Capillary 185 (H) 70 - 99 mg/dL    Comment: Glucose reference range applies only to samples taken after fasting for at least 8 hours.  CBC     Status: None   Collection Time: 04/28/21  3:54 AM  Result Value Ref Range   WBC 4.8 4.0 - 10.5 K/uL   RBC 4.86 4.22 - 5.81 MIL/uL   Hemoglobin 14.1 13.0 - 17.0 g/dL   HCT 42.3 39.0 - 52.0 %   MCV 87.0 80.0 - 100.0 fL   MCH 29.0 26.0 - 34.0 pg   MCHC 33.3 30.0 - 36.0 g/dL   RDW 11.9 11.5 - 15.5 %   Platelets 206 150 - 400 K/uL   nRBC 0.0 0.0 - 0.2 %    Comment: Performed at [facility], [address] [place], [place] [address]  Basic metabolic panel     Status: Abnormal   Collection Time: 04/28/21  3:54 AM  Result Value Ref Range   Sodium 134 (L) 135 - 145 mmol/L   Potassium 3.7 3.5 - 5.1 mmol/L   Chloride 101 98 - 111 mmol/L   CO2 26 22 - 32 mmol/L   Glucose, Bld 125 (H) 70 - 99 mg/dL    Comment: Glucose reference range applies only to samples taken after fasting for at least 8 hours.   BUN 16 6 - 20 mg/dL   Creatinine, Ser 0.76 0.61 - 1.24 mg/dL   Calcium 9.0 8.9 - 10.3 mg/dL   GFR, Estimated >60 >60 mL/min    Comment: (NOTE) Calculated using the CKD-EPI Creatinine Equation (2021)    Anion gap 7 5 - 15    Comment: Performed at [facility], [address] [place], [place] [address]  Glucose, capillary     Status: None   Collection Time: 04/28/21  8:36 AM  Result Value Ref Range   Glucose-Capillary 95 70 - 99 mg/dL    Comment: Glucose reference range applies only to samples taken after fasting for at least 8 hours.  Glucose, capillary     Status: Abnormal   Collection Time: 04/28/21 12:07 PM  Result Value Ref Range   Glucose-Capillary 136 (H) 70 - 99 mg/dL    Comment: Glucose reference range applies only to samples taken after fasting for at least 8 hours.  CBC     Status: None   Collection Time: 04/29/21  5:14 AM  Result Value Ref Range   WBC 5.2 4.0 - 10.5 K/uL   RBC 4.85 4.22 - 5.81 MIL/uL   Hemoglobin 14.2 13.0 - 17.0 g/dL   HCT 42.3 39.0 - 52.0 %   MCV 87.2 80.0 - 100.0 fL   MCH 29.3 26.0 - 34.0 pg   MCHC 33.6 30.0 - 36.0 g/dL   RDW 11.8 11.5 - 15.5 %   Platelets 223 150 - 400 K/uL   nRBC 0.0 0.0 - 0.2 %    Comment: Performed at [facility], [address] [place], [place] [address]  Basic metabolic panel     Status: Abnormal   Collection Time: 04/29/21  5:14 AM  Result Value Ref Range   Sodium 134 (L) 135 - 145 mmol/L   Potassium 3.8 3.5 - 5.1 mmol/L   Chloride 100 98 - 111 mmol/L   CO2 25 22 - 32 mmol/L   Glucose, Bld 111 (H) 70  - 99 mg/dL    Comment: Glucose reference range applies only to samples taken after fasting for at least 8 hours.   BUN 13 6 - 20 mg/dL   Creatinine, Ser 0.71 0.61 - 1.24 mg/dL   Calcium 9.0 8.9 - 10.3 mg/dL   GFR, Estimated >60 >60 mL/min    Comment: (NOTE) Calculated using the CKD-EPI Creatinine Equation (2021)    Anion gap 9 5 - 15    Comment: Performed at [facility], [address] [place], [place] [address]  TSH     Status: None   Collection Time: 04/29/21  5:14 AM  Result Value Ref Range   TSH 2.099 0.350 - 4.500 uIU/mL    Comment: Performed by a 3rd Generation assay with a functional sensitivity of <=0.01 uIU/mL. Performed at [facility], [address] [place], [place] [address]   BUN     Status: None   Collection Time: 04/29/21  1:14 PM  Result Value Ref Range   BUN 13 6 - 20 mg/dL    Comment: Performed at [facility], [address] [place], [place] [address]  Creatinine, serum     Status: None   Collection Time: 04/29/21  1:14 PM  Result Value Ref Range   Creatinine, Ser 0.78 0.61 - 1.24 mg/dL   GFR, Estimated >60 >60 mL/min    Comment: (NOTE) Calculated using the CKD-EPI Creatinine Equation (2021) Performed at [facility], [address], [place], [place] [address]   Basic metabolic panel     Status: Abnormal   Collection Time: 05/01/21  4:34 AM  Result Value Ref Range   Sodium 132 (L) 135 - 145 mmol/L   Potassium 4.1 3.5 - 5.1 mmol/L   Chloride 99 98 - 111 mmol/L   CO2 27 22 - 32 mmol/L   Glucose,  Bld 116 (H) 70 - 99 mg/dL    Comment: Glucose reference range applies only to samples taken after fasting for at least 8 hours.   BUN 21 (H) 6 - 20 mg/dL   Creatinine, Ser 0.88 0.61 - 1.24 mg/dL   Calcium 8.7 (L) 8.9 - 10.3 mg/dL   GFR, Estimated >60 >60 mL/min    Comment: (NOTE) Calculated using the CKD-EPI Creatinine Equation (2021)    Anion gap 6 5 - 15    Comment: Performed at [facility], [address] [place], [place] [address]  Basic metabolic panel     Status: Abnormal   Collection Time: 05/02/21  3:38 AM  Result Value Ref Range   Sodium 131 (L) 135 - 145 mmol/L   Potassium 4.1 3.5 - 5.1 mmol/L   Chloride 99 98 - 111 mmol/L   CO2 27 22 - 32 mmol/L   Glucose, Bld 165 (H) 70 - 99 mg/dL    Comment: Glucose reference range applies only to samples taken after fasting for at least 8 hours.   BUN 17 6 - 20 mg/dL   Creatinine, Ser 0.79 0.61 - 1.24 mg/dL   Calcium 8.7 (L) 8.9 - 10.3 mg/dL   GFR, Estimated >60 >60 mL/min    Comment: (NOTE) Calculated using the CKD-EPI Creatinine Equation (2021)    Anion gap 5 5 - 15    Comment: Performed at [facility], [address] [place], [place] [address]  Osmolality     Status: None   Collection Time: 05/02/21  8:54 AM  Result Value Ref Range   Osmolality 280 275 - 295 mOsm/kg    Comment: Performed at [facility], [address] [place], [place] [address]  BUN     Status: None   Collection Time: 05/02/21  8:54 AM  Result Value Ref Range   BUN 16 6 - 20 mg/dL    Comment: Performed at [facility], [address] [place], [place] [address]  Creatinine, serum     Status: None   Collection Time: 05/02/21  8:54 AM  Result Value Ref Range   Creatinine, Ser 0.78 0.61 - 1.24 mg/dL   GFR, Estimated >60 >60 mL/min    Comment: (NOTE) Calculated using the CKD-EPI Creatinine Equation (2021) Performed at [facility], [address] [place],  [address]   Glucose, capillary     Status: Abnormal   Collection Time: 05/02/21  5:51 PM  Result Value Ref Range   Glucose-Capillary 111 (H) 70 - 99 mg/dL    Comment: Glucose reference range applies only to samples taken after fasting for at least 8 hours.  Basic metabolic panel     Status: Abnormal   Collection Time: 05/03/21  4:36 AM  Result Value Ref Range   Sodium 129 (L) 135 - 145 mmol/L   Potassium 4.4 3.5 - 5.1 mmol/L   Chloride 97 (L)  98 - 111 mmol/L   CO2 27 22 - 32 mmol/L   Glucose, Bld 193 (H) 70 - 99 mg/dL    Comment: Glucose reference range applies only to samples taken after fasting for at least 8 hours.   BUN 21 (H) 6 - 20 mg/dL   Creatinine, Ser 0.85 0.61 - 1.24 mg/dL   Calcium 8.7 (L) 8.9 - 10.3 mg/dL   GFR, Estimated >60 >60 mL/min    Comment: (NOTE) Calculated using the CKD-EPI Creatinine Equation (2021)    Anion gap 5 5 - 15  Comment: Performed at [facility], [address] [place], [place] [address]  Osmolality, urine     Status: None   Collection Time: 05/03/21  9:52 AM  Result Value Ref Range   Osmolality, Ur 465 300 - 900 mOsm/kg    Comment: Performed at [facility], [address] [place], [place] [address]  Sodium, urine, random     Status: None   Collection Time: 05/03/21  9:52 AM  Result Value Ref Range   Sodium, Ur 48 mmol/L    Comment: Performed at [facility], [address] [place], [place] [address]  Basic metabolic panel     Status: Abnormal   Collection Time: 05/04/21  3:14 AM  Result Value Ref Range   Sodium 130 (L) 135 - 145 mmol/L   Potassium 4.1 3.5 - 5.1 mmol/L   Chloride 97 (L) 98 - 111 mmol/L   CO2 27 22 - 32 mmol/L   Glucose, Bld 158 (H) 70 - 99 mg/dL    Comment: Glucose reference range applies only to samples taken after fasting for at least 8 hours.   BUN 15 6 - 20 mg/dL   Creatinine, Ser 0.69 0.61 - 1.24 mg/dL   Calcium 8.7 (L) 8.9 - 10.3 mg/dL   GFR, Estimated >60 >60 mL/min    Comment: (NOTE) Calculated using the CKD-EPI Creatinine Equation (2021)    Anion gap 6 5 - 15    Comment: Performed at [facility], [address] [place], [place] [address]  Magnesium     Status: None   Collection Time: 05/04/21  3:14 AM  Result Value Ref Range   Magnesium 2.0 1.7 - 2.4 mg/dL    Comment: Performed at [facility], [address] [place], [place] [address]  Surgical pcr screen     Status: None   Collection  Time: 05/04/21  2:05 PM   Specimen: Nasal Mucosa; Nasal Swab  Result Value Ref Range   MRSA, PCR NEGATIVE NEGATIVE   Staphylococcus aureus NEGATIVE NEGATIVE    Comment: (NOTE) The Xpert SA Assay (FDA approved for NASAL specimens in patients 73 years of age and older), is one component of a comprehensive surveillance program. It is not intended to diagnose infection nor to guide or monitor treatment. Performed at [facility], [address] [place], [place] [address]   Glucose, capillary     Status: None   Collection Time: 05/04/21  3:59 PM  Result Value Ref Range   Glucose-Capillary 95 70 - 99 mg/dL    Comment: Glucose reference range applies only to samples taken after fasting for at least 8 hours.  Surgical pathology     Status: None   Collection Time: 05/04/21  5:15 PM  Result Value Ref Range   SURGICAL PATHOLOGY      SURGICAL PATHOLOGY CASE: [id number] PATIENT: [place] Surgical Pathology Report     Specimen Submitted: A. Metatarsal, right first  Clinical History: Partial right ray amputation      DIAGNOSIS: A.  FOOT, RIGHT FIRST METATARSAL; RESECTION: -BONE WITH MARKED REACTIVEREPARATIVE CHANGE, FOCAL FIBROSIS AND SURFACE CHRONIC INFLAMMATION (SEE COMMENT) -MARGIN SHOWS NO EVIDENCE OF OSTEOMYELITIS  Comment: The representative sections show no evidence of acute osteomyelitis at this time.  However there are changes that can be seen chronic/treated osteomyelitis though this diagnosis requires extensive clinical and radiographic correlation.   GROSS DESCRIPTION: A. Labeled: Right foot first metatarsal, proximal margin with ink Received: Fresh Collection time: 5:15 PM on 05/04/2021  Placed into formalin time: 5:43 PM on 05/04/2021 Size: 3 x 2.2 x 1.1 cm and 2.6 x 2.6 x 2.2 cm Description of lesion(s): Received are 2 fragments of bone.  The smaller bone fragme nt is encased in tan smooth articular cartilage and tan-pink soft tissue.  The  larger bone fragment has a roughened disarticulated surface with an opposing cut surface. Proximal margin: The cut surface on the larger bone fragment is inked purple and is over inked blue.  The proximal margin is grossly viable with tan firm bone. Bone: The bone is diffusely firm. Other findings: None grossly appreciated.  Block summary: 1 - 2 - cut bone at resection margin, perpendicularly sectioned and submitted entirely 3 - representative bone fragment with surrounding by cartilage and soft tissue 4 - representative disarticulated bone opposing cut surface  Tissue decalcification: Cassettes 1-4  RB 05/05/2021  Final Diagnosis performed by Theodora Blow, MD.   Electronically signed 05/06/2021 2:08:39PM The electronic signature indicates that the named Attending Pathologist has evaluated the specimen Technical component performed at [facility], [address] [place], [place] [address] Lab: [phone] Dir: Rush Farmer, MD, MMM  Professional component performed at [facility], [address], [place], [place] [address] Lab: [phone] Dir: Kathi Simpers, MD   Glucose, capillary     Status: None   Collection Time: 05/04/21  5:46 PM  Result Value Ref Range   Glucose-Capillary 94 70 - 99 mg/dL    Comment: Glucose reference range applies only to samples taken after fasting for at least 8 hours.  Glucose, capillary     Status: Abnormal   Collection Time: 05/06/21  5:07 AM  Result Value Ref Range   Glucose-Capillary 114 (H) 70 - 99 mg/dL    Comment: Glucose reference range applies only to samples taken after fasting for at least 8 hours.  Glucose, capillary     Status: Abnormal   Collection Time: 05/06/21  7:42 AM  Result Value Ref Range   Glucose-Capillary 107 (H) 70 - 99 mg/dL    Comment: Glucose reference range applies only to samples taken after fasting for at least 8 hours.  Comprehensive metabolic panel     Status: Abnormal   Collection Time:  05/09/21  6:17 AM  Result Value Ref Range   Sodium 133 (L) 135 - 145 mmol/L   Potassium 3.9 3.5 - 5.1 mmol/L   Chloride 100 98 - 111 mmol/L   CO2 25 22 - 32 mmol/L   Glucose, Bld 143 (H) 70 - 99 mg/dL    Comment: Glucose reference range applies only to samples taken after fasting for at least 8 hours.   BUN 15 6 - 20 mg/dL   Creatinine, Ser 0.77 0.61 - 1.24 mg/dL   Calcium 9.0 8.9 - 10.3 mg/dL   Total Protein 7.9 6.5 - 8.1 g/dL   Albumin 2.9 (L) 3.5 - 5.0 g/dL   AST 24 15 - 41 U/L   ALT 28 0 - 44 U/L   Alkaline Phosphatase 60 38 - 126 U/L   Total Bilirubin 1.0 0.3 - 1.2 mg/dL   GFR, Estimated >60 >60 mL/min    Comment: (NOTE) Calculated using the CKD-EPI Creatinine Equation (2021)    Anion gap 8 5 - 15    Comment: Performed at [facility] [address] [place], [place] [address]  CBC with Differential/Platelet     Status: Abnormal   Collection Time: 05/09/21  6:17 AM  Result Value Ref Range  WBC 5.7 4.0 - 10.5 K/uL   RBC 4.28 4.22 - 5.81 MIL/uL   Hemoglobin 12.5 (L) 13.0 - 17.0 g/dL   HCT 37.8 (L) 39.0 - 52.0 %   MCV 88.3 80.0 - 100.0 fL   MCH 29.2 26.0 - 34.0 pg   MCHC 33.1 30.0 - 36.0 g/dL   RDW 12.0 11.5 - 15.5 %   Platelets 244 150 - 400 K/uL   nRBC 0.0 0.0 - 0.2 %   Neutrophils Relative % 70 %   Neutro Abs 4.0 1.7 - 7.7 K/uL   Lymphocytes Relative 21 %   Lymphs Abs 1.2 0.7 - 4.0 K/uL   Monocytes Relative 6 %   Monocytes Absolute 0.3 0.1 - 1.0 K/uL   Eosinophils Relative 2 %   Eosinophils Absolute 0.1 0.0 - 0.5 K/uL   Basophils Relative 1 %   Basophils Absolute 0.0 0.0 - 0.1 K/uL   Immature Granulocytes 0 %   Abs Immature Granulocytes 0.01 0.00 - 0.07 K/uL    Comment: Performed at [employer] [address] [place], [place] [address]  Glucose, capillary     Status: Abnormal   Collection Time: 05/11/21 11:50 AM  Result Value Ref Range   Glucose-Capillary 183 (H) 70 - 99 mg/dL    Comment: Glucose reference range applies only to samples taken  after fasting for at least 8 hours.  Glucose, capillary     Status: Abnormal   Collection Time: 05/12/21 12:07 PM  Result Value Ref Range   Glucose-Capillary 144 (H) 70 - 99 mg/dL    Comment: Glucose reference range applies only to samples taken after fasting for at least 8 hours.  Glucose, capillary     Status: Abnormal   Collection Time: 05/12/21  4:58 PM  Result Value Ref Range   Glucose-Capillary 159 (H) 70 - 99 mg/dL    Comment: Glucose reference range applies only to samples taken after fasting for at least 8 hours.  Glucose, capillary     Status: Abnormal   Collection Time: 05/12/21  9:30 PM  Result Value Ref Range   Glucose-Capillary 188 (H) 70 - 99 mg/dL    Comment: Glucose reference range applies only to samples taken after fasting for at least 8 hours.  Glucose, capillary     Status: Abnormal   Collection Time: 05/13/21 11:58 AM  Result Value Ref Range   Glucose-Capillary 159 (H) 70 - 99 mg/dL    Comment: Glucose reference range applies only to samples taken after fasting for at least 8 hours.  Glucose, capillary     Status: Abnormal   Collection Time: 05/13/21  4:43 PM  Result Value Ref Range   Glucose-Capillary 105 (H) 70 - 99 mg/dL    Comment: Glucose reference range applies only to samples taken after fasting for at least 8 hours.  Glucose, capillary     Status: Abnormal   Collection Time: 05/13/21  8:51 PM  Result Value Ref Range   Glucose-Capillary 154 (H) 70 - 99 mg/dL    Comment: Glucose reference range applies only to samples taken after fasting for at least 8 hours.  Glucose, capillary     Status: Abnormal   Collection Time: 05/14/21  6:48 AM  Result Value Ref Range   Glucose-Capillary 108 (H) 70 - 99 mg/dL    Comment: Glucose reference range applies only to samples taken after fasting for at least 8 hours.  Glucose, capillary     Status: Abnormal   Collection Time:  05/14/21 12:02 PM  Result Value Ref Range   Glucose-Capillary 139 (H) 70 - 99 mg/dL     Comment: Glucose reference range applies only to samples taken after fasting for at least 8 hours.  Glucose, capillary     Status: Abnormal   Collection Time: 05/14/21  4:48 PM  Result Value Ref Range   Glucose-Capillary 143 (H) 70 - 99 mg/dL    Comment: Glucose reference range applies only to samples taken after fasting for at least 8 hours.  Glucose, capillary     Status: Abnormal   Collection Time: 05/14/21  8:05 PM  Result Value Ref Range   Glucose-Capillary 191 (H) 70 - 99 mg/dL    Comment: Glucose reference range applies only to samples taken after fasting for at least 8 hours.  Glucose, capillary     Status: Abnormal   Collection Time: 05/14/21 11:58 PM  Result Value Ref Range   Glucose-Capillary 133 (H) 70 - 99 mg/dL    Comment: Glucose reference range applies only to samples taken after fasting for at least 8 hours.  Glucose, capillary     Status: Abnormal   Collection Time: 05/15/21  4:03 AM  Result Value Ref Range   Glucose-Capillary 107 (H) 70 - 99 mg/dL    Comment: Glucose reference range applies only to samples taken after fasting for at least 8 hours.  Glucose, capillary     Status: Abnormal   Collection Time: 05/15/21 11:56 AM  Result Value Ref Range   Glucose-Capillary 169 (H) 70 - 99 mg/dL    Comment: Glucose reference range applies only to samples taken after fasting for at least 8 hours.  Glucose, capillary     Status: Abnormal   Collection Time: 05/15/21  4:51 PM  Result Value Ref Range   Glucose-Capillary 142 (H) 70 - 99 mg/dL    Comment: Glucose reference range applies only to samples taken after fasting for at least 8 hours.  Glucose, capillary     Status: Abnormal   Collection Time: 05/15/21  9:56 PM  Result Value Ref Range   Glucose-Capillary 175 (H) 70 - 99 mg/dL    Comment: Glucose reference range applies only to samples taken after fasting for at least 8 hours.  Basic metabolic panel     Status: Abnormal   Collection Time: 05/16/21  6:13 AM  Result  Value Ref Range   Sodium 133 (L) 135 - 145 mmol/L   Potassium 4.1 3.5 - 5.1 mmol/L   Chloride 100 98 - 111 mmol/L   CO2 26 22 - 32 mmol/L   Glucose, Bld 111 (H) 70 - 99 mg/dL    Comment: Glucose reference range applies only to samples taken after fasting for at least 8 hours.   BUN 22 (H) 6 - 20 mg/dL   Creatinine, Ser 0.94 0.61 - 1.24 mg/dL   Calcium 9.0 8.9 - 10.3 mg/dL   GFR, Estimated >60 >60 mL/min    Comment: (NOTE) Calculated using the CKD-EPI Creatinine Equation (2021)    Anion gap 7 5 - 15    Comment: Performed at [facility] [address] [place], [place] [address]  Glucose, capillary     Status: Abnormal   Collection Time: 05/16/21  6:13 AM  Result Value Ref Range   Glucose-Capillary 119 (H) 70 - 99 mg/dL    Comment: Glucose reference range applies only to samples taken after fasting for at least 8 hours.  Glucose, capillary     Status:  Abnormal   Collection Time: 05/16/21 12:18 PM  Result Value Ref Range   Glucose-Capillary 133 (H) 70 - 99 mg/dL    Comment: Glucose reference range applies only to samples taken after fasting for at least 8 hours.  Glucose, capillary     Status: Abnormal   Collection Time: 05/16/21  4:30 PM  Result Value Ref Range   Glucose-Capillary 146 (H) 70 - 99 mg/dL    Comment: Glucose reference range applies only to samples taken after fasting for at least 8 hours.  Glucose, capillary     Status: Abnormal   Collection Time: 05/16/21  9:42 PM  Result Value Ref Range   Glucose-Capillary 167 (H) 70 - 99 mg/dL    Comment: Glucose reference range applies only to samples taken after fasting for at least 8 hours.  Glucose, capillary     Status: None   Collection Time: 05/17/21  6:24 AM  Result Value Ref Range   Glucose-Capillary 96 70 - 99 mg/dL    Comment: Glucose reference range applies only to samples taken after fasting for at least 8 hours.  Glucose, capillary     Status: Abnormal   Collection Time: 05/17/21 11:35 AM  Result Value  Ref Range   Glucose-Capillary 329 (H) 70 - 99 mg/dL    Comment: Glucose reference range applies only to samples taken after fasting for at least 8 hours.  Glucose, capillary     Status: Abnormal   Collection Time: 05/17/21 12:12 PM  Result Value Ref Range   Glucose-Capillary 129 (H) 70 - 99 mg/dL    Comment: Glucose reference range applies only to samples taken after fasting for at least 8 hours.  Glucose, capillary     Status: Abnormal   Collection Time: 05/17/21  5:51 PM  Result Value Ref Range   Glucose-Capillary 141 (H) 70 - 99 mg/dL    Comment: Glucose reference range applies only to samples taken after fasting for at least 8 hours.  Glucose, capillary     Status: Abnormal   Collection Time: 05/17/21  8:33 PM  Result Value Ref Range   Glucose-Capillary 208 (H) 70 - 99 mg/dL    Comment: Glucose reference range applies only to samples taken after fasting for at least 8 hours.  Glucose, capillary     Status: None   Collection Time: 05/18/21  5:43 AM  Result Value Ref Range   Glucose-Capillary 84 70 - 99 mg/dL    Comment: Glucose reference range applies only to samples taken after fasting for at least 8 hours.  Glucose, capillary     Status: Abnormal   Collection Time: 05/18/21 12:00 PM  Result Value Ref Range   Glucose-Capillary 151 (H) 70 - 99 mg/dL    Comment: Glucose reference range applies only to samples taken after fasting for at least 8 hours.  Glucose, capillary     Status: Abnormal   Collection Time: 05/18/21  4:45 PM  Result Value Ref Range   Glucose-Capillary 152 (H) 70 - 99 mg/dL    Comment: Glucose reference range applies only to samples taken after fasting for at least 8 hours.  Glucose, capillary     Status: Abnormal   Collection Time: 05/18/21  8:57 PM  Result Value Ref Range   Glucose-Capillary 152 (H) 70 - 99 mg/dL    Comment: Glucose reference range applies only to samples taken after fasting for at least 8 hours.  Glucose, capillary     Status: None    Collection  Time: 05/19/21  6:02 AM  Result Value Ref Range   Glucose-Capillary 94 70 - 99 mg/dL    Comment: Glucose reference range applies only to samples taken after fasting for at least 8 hours.  Glucose, capillary     Status: Abnormal   Collection Time: 05/19/21 11:45 AM  Result Value Ref Range   Glucose-Capillary 141 (H) 70 - 99 mg/dL    Comment: Glucose reference range applies only to samples taken after fasting for at least 8 hours.  Glucose, capillary     Status: Abnormal   Collection Time: 05/19/21  4:43 PM  Result Value Ref Range   Glucose-Capillary 165 (H) 70 - 99 mg/dL    Comment: Glucose reference range applies only to samples taken after fasting for at least 8 hours.  Glucose, capillary     Status: Abnormal   Collection Time: 05/19/21  9:10 PM  Result Value Ref Range   Glucose-Capillary 187 (H) 70 - 99 mg/dL    Comment: Glucose reference range applies only to samples taken after fasting for at least 8 hours.  Glucose, capillary     Status: None   Collection Time: 05/20/21  6:13 AM  Result Value Ref Range   Glucose-Capillary 97 70 - 99 mg/dL    Comment: Glucose reference range applies only to samples taken after fasting for at least 8 hours.  Glucose, capillary     Status: Abnormal   Collection Time: 05/20/21 12:12 PM  Result Value Ref Range   Glucose-Capillary 128 (H) 70 - 99 mg/dL    Comment: Glucose reference range applies only to samples taken after fasting for at least 8 hours.  Glucose, capillary     Status: Abnormal   Collection Time: 05/20/21  4:50 PM  Result Value Ref Range   Glucose-Capillary 197 (H) 70 - 99 mg/dL    Comment: Glucose reference range applies only to samples taken after fasting for at least 8 hours.  Glucose, capillary     Status: Abnormal   Collection Time: 05/20/21  9:09 PM  Result Value Ref Range   Glucose-Capillary 219 (H) 70 - 99 mg/dL    Comment: Glucose reference range applies only to samples taken after fasting for at least 8 hours.    Comment 1 Notify RN   Glucose, capillary     Status: Abnormal   Collection Time: 05/21/21  6:02 AM  Result Value Ref Range   Glucose-Capillary 162 (H) 70 - 99 mg/dL    Comment: Glucose reference range applies only to samples taken after fasting for at least 8 hours.  Basic metabolic panel     Status: Abnormal   Collection Time: 05/21/21  9:21 AM  Result Value Ref Range   Sodium 129 (L) 135 - 145 mmol/L   Potassium 3.7 3.5 - 5.1 mmol/L   Chloride 97 (L) 98 - 111 mmol/L   CO2 22 22 - 32 mmol/L   Glucose, Bld 197 (H) 70 - 99 mg/dL    Comment: Glucose reference range applies only to samples taken after fasting for at least 8 hours.   BUN 22 (H) 6 - 20 mg/dL   Creatinine, Ser 1.00 0.61 - 1.24 mg/dL   Calcium 8.8 (L) 8.9 - 10.3 mg/dL   GFR, Estimated >60 >60 mL/min    Comment: (NOTE) Calculated using the CKD-EPI Creatinine Equation (2021)    Anion gap 10 5 - 15    Comment: Performed at [facility] [address], [name], [place] [address]  Glucose, capillary     Status: Abnormal   Collection Time: 05/21/21 12:14 PM  Result Value Ref Range   Glucose-Capillary 168 (H) 70 - 99 mg/dL    Comment: Glucose reference range applies only to samples taken after fasting for at least 8 hours.  Urinalysis, Routine w reflex microscopic Urine, In & Out Cath     Status: Abnormal   Collection Time: 05/21/21  1:37 PM  Result Value Ref Range   Color, Urine YELLOW YELLOW   APPearance HAZY (A) CLEAR   Specific Gravity, Urine 1.020 1.005 - 1.030   pH 5.0 5.0 - 8.0   Glucose, UA NEGATIVE NEGATIVE mg/dL   Hgb urine dipstick SMALL (A) NEGATIVE   Bilirubin Urine NEGATIVE NEGATIVE   Ketones, ur NEGATIVE NEGATIVE mg/dL   Protein, ur NEGATIVE NEGATIVE mg/dL   Nitrite POSITIVE (A) NEGATIVE   Leukocytes,Ua LARGE (A) NEGATIVE   WBC, UA 21-50 0 - 5 WBC/hpf   Bacteria, UA MANY (A) NONE SEEN   Squamous Epithelial / LPF 0-5 0 - 5    Comment: Performed at [facility], [address]  [place], [place] [address]  Glucose, capillary     Status: Abnormal   Collection Time: 05/21/21  4:27 PM  Result Value Ref Range   Glucose-Capillary 185 (H) 70 - 99 mg/dL    Comment: Glucose reference range applies only to samples taken after fasting for at least 8 hours.  Glucose, capillary     Status: Abnormal   Collection Time: 05/21/21  8:59 PM  Result Value Ref Range   Glucose-Capillary 250 (H) 70 - 99 mg/dL    Comment: Glucose reference range applies only to samples taken after fasting for at least 8 hours.  Glucose, capillary     Status: Abnormal   Collection Time: 05/22/21  6:14 AM  Result Value Ref Range   Glucose-Capillary 192 (H) 70 - 99 mg/dL    Comment: Glucose reference range applies only to samples taken after fasting for at least 8 hours.   Comment 1 Notify RN   Glucose, capillary     Status: Abnormal   Collection Time: 05/22/21 11:43 AM  Result Value Ref Range   Glucose-Capillary 298 (H) 70 - 99 mg/dL    Comment: Glucose reference range applies only to samples taken after fasting for at least 8 hours.  Glucose, capillary     Status: Abnormal   Collection Time: 05/22/21  4:45 PM  Result Value Ref Range   Glucose-Capillary 213 (H) 70 - 99 mg/dL    Comment: Glucose reference range applies only to samples taken after fasting for at least 8 hours.  Glucose, capillary     Status: Abnormal   Collection Time: 05/22/21  8:22 PM  Result Value Ref Range   Glucose-Capillary 233 (H) 70 - 99 mg/dL    Comment: Glucose reference range applies only to samples taken after fasting for at least 8 hours.   Comment 1 Notify RN   Glucose, capillary     Status: Abnormal   Collection Time: 05/23/21  5:59 AM  Result Value Ref Range   Glucose-Capillary 201 (H) 70 - 99 mg/dL    Comment: Glucose reference range applies only to samples taken after fasting for at least 8 hours.  Basic metabolic panel     Status: Abnormal   Collection Time: 05/23/21  6:50 AM  Result Value Ref Range   Sodium 134  (L) 135 - 145 mmol/L   Potassium 3.5 3.5 -  5.1 mmol/L   Chloride 102 98 - 111 mmol/L   CO2 25 22 - 32 mmol/L   Glucose, Bld 176 (H) 70 - 99 mg/dL    Comment: Glucose reference range applies only to samples taken after fasting for at least 8 hours.   BUN 24 (H) 6 - 20 mg/dL   Creatinine, Ser 0.98 0.61 - 1.24 mg/dL   Calcium 8.9 8.9 - 10.3 mg/dL   GFR, Estimated >60 >60 mL/min    Comment: (NOTE) Calculated using the CKD-EPI Creatinine Equation (2021)    Anion gap 7 5 - 15    Comment: Performed at [facility] [address], [place], [place] [address]  Glucose, capillary     Status: Abnormal   Collection Time: 05/23/21 11:33 AM  Result Value Ref Range   Glucose-Capillary 208 (H) 70 - 99 mg/dL    Comment: Glucose reference range applies only to samples taken after fasting for at least 8 hours.  Glucose, capillary     Status: Abnormal   Collection Time: 05/23/21  5:04 PM  Result Value Ref Range   Glucose-Capillary 172 (H) 70 - 99 mg/dL    Comment: Glucose reference range applies only to samples taken after fasting for at least 8 hours.  Glucose, capillary     Status: Abnormal   Collection Time: 05/23/21  8:57 PM  Result Value Ref Range   Glucose-Capillary 185 (H) 70 - 99 mg/dL    Comment: Glucose reference range applies only to samples taken after fasting for at least 8 hours.  Urine Culture     Status: None   Collection Time: 05/24/21 12:00 AM   Specimen: In/Out Cath Urine  Result Value Ref Range   Specimen Description IN/OUT CATH URINE    Special Requests SAMPLE COLLECTED AND RECEIVED 05/24/21    Culture      NO GROWTH Performed at [facility], [place] [address], [place], [place] [address]    Report Status 05/25/2021 FINAL   Glucose, capillary     Status: Abnormal   Collection Time: 05/24/21  6:20 AM  Result Value Ref Range   Glucose-Capillary 137 (H) 70 - 99 mg/dL    Comment: Glucose reference range applies only to samples taken after fasting for at least 8 hours.   CBC with Differential/Platelet     Status: Abnormal   Collection Time: 05/24/21  9:09 AM  Result Value Ref Range   WBC 5.7 4.0 - 10.5 K/uL   RBC 4.00 (L) 4.22 - 5.81 MIL/uL   Hemoglobin 11.6 (L) 13.0 - 17.0 g/dL   HCT 35.2 (L) 39.0 - 52.0 %   MCV 88.0 80.0 - 100.0 fL   MCH 29.0 26.0 - 34.0 pg   MCHC 33.0 30.0 - 36.0 g/dL   RDW 13.2 11.5 - 15.5 %   Platelets 193 150 - 400 K/uL   nRBC 0.0 0.0 - 0.2 %   Neutrophils Relative % 83 %   Neutro Abs 4.8 1.7 - 7.7 K/uL   Lymphocytes Relative 11 %   Lymphs Abs 0.6 (L) 0.7 - 4.0 K/uL   Monocytes Relative 6 %   Monocytes Absolute 0.3 0.1 - 1.0 K/uL   Eosinophils Relative 0 %   Eosinophils Absolute 0.0 0.0 - 0.5 K/uL   Basophils Relative 0 %   Basophils Absolute 0.0 0.0 - 0.1 K/uL   Immature Granulocytes 0 %   Abs Immature Granulocytes 0.02 0.00 - 0.07 K/uL    Comment: Performed at [facility], [place] [address], [place], [place] [address]  Glucose, capillary     Status: Abnormal   Collection Time: 05/24/21 11:35 AM  Result Value Ref Range   Glucose-Capillary 167 (H) 70 - 99 mg/dL    Comment: Glucose reference range applies only to samples taken after fasting for at least 8 hours.  Glucose, capillary     Status: Abnormal   Collection Time: 05/24/21  4:44 PM  Result Value Ref Range   Glucose-Capillary 173 (H) 70 - 99 mg/dL    Comment: Glucose reference range applies only to samples taken after fasting for at least 8 hours.  Glucose, capillary     Status: Abnormal   Collection Time: 05/24/21  9:21 PM  Result Value Ref Range   Glucose-Capillary 210 (H) 70 - 99 mg/dL    Comment: Glucose reference range applies only to samples taken after fasting for at least 8 hours.  Culture, blood (Routine X 2) w Reflex to ID Panel     Status: None   Collection Time: 05/24/21 10:16 PM   Specimen: BLOOD  Result Value Ref Range   Specimen Description BLOOD BLOOD LEFT HAND    Special Requests      BOTTLES DRAWN AEROBIC AND ANAEROBIC Blood Culture  adequate volume   Culture      NO GROWTH 5 DAYS Performed at [facility], [place] [address], [place], [place] [address]    Report Status 05/29/2021 FINAL   Culture, blood (Routine X 2) w Reflex to ID Panel     Status: None   Collection Time: 05/24/21 10:16 PM   Specimen: BLOOD  Result Value Ref Range   Specimen Description BLOOD BLOOD RIGHT HAND    Special Requests      BOTTLES DRAWN AEROBIC AND ANAEROBIC Blood Culture adequate volume   Culture      NO GROWTH 5 DAYS Performed at [facility], [place] [address], [place], [place] [address]    Report Status 05/29/2021 FINAL   Lactic acid, plasma     Status: Abnormal   Collection Time: 05/24/21 10:24 PM  Result Value Ref Range   Lactic Acid, Venous 2.1 (HH) 0.5 - 1.9 mmol/L    Comment: CRITICAL RESULT CALLED TO, READ BACK BY AND VERIFIED WITH: Susa Raring RN 05/25/21 0123 Wiliam Ke Performed at [facility], [place] [address] [place], [place] [address]   CBC with Differential/Platelet     Status: Abnormal   Collection Time: 05/24/21 10:24 PM  Result Value Ref Range   WBC 4.7 4.0 - 10.5 K/uL   RBC 4.13 (L) 4.22 - 5.81 MIL/uL   Hemoglobin 12.3 (L) 13.0 - 17.0 g/dL   HCT 36.1 (L) 39.0 - 52.0 %   MCV 87.4 80.0 - 100.0 fL   MCH 29.8 26.0 - 34.0 pg   MCHC 34.1 30.0 - 36.0 g/dL   RDW 13.2 11.5 - 15.5 %   Platelets 191 150 - 400 K/uL   nRBC 0.0 0.0 - 0.2 %   Neutrophils Relative % 72 %   Neutro Abs 3.4 1.7 - 7.7 K/uL   Lymphocytes Relative 18 %   Lymphs Abs 0.8 0.7 - 4.0 K/uL   Monocytes Relative 8 %   Monocytes Absolute 0.4 0.1 - 1.0 K/uL   Eosinophils Relative 0 %   Eosinophils Absolute 0.0 0.0 - 0.5 K/uL   Basophils Relative 1 %   Basophils Absolute 0.0 0.0 - 0.1 K/uL   Immature Granulocytes 1 %  Abs Immature Granulocytes 0.03 0.00 - 0.07 K/uL    Comment: Performed at [facility], [place] [address], [place], [place] [address]  Procalcitonin - Baseline     Status: None   Collection Time: 05/24/21 10:24 PM   Result Value Ref Range   Procalcitonin 0.27 ng/mL    Comment:        Interpretation: PCT (Procalcitonin) <= 0.5 ng/mL: Systemic infection (sepsis) is not likely. Local bacterial infection is possible. (NOTE)       Sepsis PCT Algorithm           Lower Respiratory Tract                                      Infection PCT Algorithm    ----------------------------     ----------------------------         PCT < 0.25 ng/mL                PCT < 0.10 ng/mL          Strongly encourage             Strongly discourage   discontinuation of antibiotics    initiation of antibiotics    ----------------------------     -----------------------------       PCT 0.25 - 0.50 ng/mL            PCT 0.10 - 0.25 ng/mL               OR       >80% decrease in PCT            Discourage initiation of                                            antibiotics      Encourage discontinuation           of antibiotics    ----------------------------     -----------------------------         PCT >= 0.50 ng/mL              PCT 0.26 - 0.50 ng/mL               AND        <80% decrease in PCT             Encourage initiation of                                             antibiotics       Encourage continuation           of antibiotics    ----------------------------     -----------------------------        PCT >= 0.50 ng/mL                  PCT > 0.50 ng/mL               AND         increase in PCT                  Strongly encourage  initiation of antibiotics    Strongly encourage escalation           of antibiotics                                     -----------------------------                                           PCT <= 0.25 ng/mL                                                 OR                                        > 80% decrease in PCT                                      Discontinue / Do not initiate                                              antibiotics  Performed at [facility], [address], [place], [place] [address]   Basic metabolic panel     Status: Abnormal   Collection Time: 05/24/21 10:24 PM  Result Value Ref Range   Sodium 133 (L) 135 - 145 mmol/L   Potassium 3.6 3.5 - 5.1 mmol/L   Chloride 100 98 - 111 mmol/L   CO2 22 22 - 32 mmol/L   Glucose, Bld 218 (H) 70 - 99 mg/dL    Comment: Glucose reference range applies only to samples taken after fasting for at least 8 hours.   BUN 31 (H) 6 - 20 mg/dL   Creatinine, Ser 3.38 0.61 - 1.24 mg/dL   Calcium 8.7 (L) 8.9 - 10.3 mg/dL   GFR, Estimated >82 >66 mL/min    Comment: (NOTE) Calculated using the CKD-EPI Creatinine Equation (2021)    Anion gap 11 5 - 15    Comment: Performed at [facility], [address], [place], [place] [address]  CBC with Differential/Platelet     Status: Abnormal   Collection Time: 05/25/21  5:46 AM  Result Value Ref Range   WBC 4.6 4.0 - 10.5 K/uL   RBC 3.65 (L) 4.22 - 5.81 MIL/uL   Hemoglobin 10.7 (L) 13.0 - 17.0 g/dL   HCT 16.1 (L) 22.4 - 00.1 %   MCV 88.8 80.0 - 100.0 fL   MCH 29.3 26.0 - 34.0 pg   MCHC 33.0 30.0 - 36.0 g/dL   RDW 80.9 70.4 - 49.2 %   Platelets 178 150 - 400 K/uL   nRBC 0.0 0.0 - 0.2 %   Neutrophils Relative % 67 %   Neutro Abs 3.0 1.7 - 7.7 K/uL   Lymphocytes Relative 24 %   Lymphs Abs 1.1 0.7 - 4.0 K/uL   Monocytes Relative 8 %  Monocytes Absolute 0.4 0.1 - 1.0 K/uL   Eosinophils Relative 1 %   Eosinophils Absolute 0.0 0.0 - 0.5 K/uL   Basophils Relative 0 %   Basophils Absolute 0.0 0.0 - 0.1 K/uL   Immature Granulocytes 0 %   Abs Immature Granulocytes 0.02 0.00 - 0.07 K/uL    Comment: Performed at [facility], [place] [address], [place], [place] [address]  Sedimentation rate     Status: Abnormal   Collection Time: 05/25/21  5:46 AM  Result Value Ref Range   Sed Rate 74 (H) 0 - 16 mm/hr    Comment: Performed at [facility] [address], [place], [place] [address]  Lactic  acid, plasma     Status: None   Collection Time: 05/25/21  5:46 AM  Result Value Ref Range   Lactic Acid, Venous 1.4 0.5 - 1.9 mmol/L    Comment: Performed at [facility] [address], [place], [place] [address]  Glucose, capillary     Status: Abnormal   Collection Time: 05/25/21  5:48 AM  Result Value Ref Range   Glucose-Capillary 146 (H) 70 - 99 mg/dL    Comment: Glucose reference range applies only to samples taken after fasting for at least 8 hours.  Glucose, capillary     Status: Abnormal   Collection Time: 05/25/21 11:47 AM  Result Value Ref Range   Glucose-Capillary 171 (H) 70 - 99 mg/dL    Comment: Glucose reference range applies only to samples taken after fasting for at least 8 hours.  Glucose, capillary     Status: Abnormal   Collection Time: 05/25/21  4:26 PM  Result Value Ref Range   Glucose-Capillary 155 (H) 70 - 99 mg/dL    Comment: Glucose reference range applies only to samples taken after fasting for at least 8 hours.  Glucose, capillary     Status: Abnormal   Collection Time: 05/25/21  8:59 PM  Result Value Ref Range   Glucose-Capillary 219 (H) 70 - 99 mg/dL    Comment: Glucose reference range applies only to samples taken after fasting for at least 8 hours.  Procalcitonin     Status: None   Collection Time: 05/26/21  5:17 AM  Result Value Ref Range   Procalcitonin 0.15 ng/mL    Comment:        Interpretation: PCT (Procalcitonin) <= 0.5 ng/mL: Systemic infection (sepsis) is not likely. Local bacterial infection is possible. (NOTE)       Sepsis PCT Algorithm           Lower Respiratory Tract                                      Infection PCT Algorithm    ----------------------------     ----------------------------         PCT < 0.25 ng/mL                PCT < 0.10 ng/mL          Strongly encourage             Strongly discourage   discontinuation of antibiotics    initiation of antibiotics    ----------------------------      -----------------------------       PCT 0.25 - 0.50 ng/mL            PCT 0.10 - 0.25 ng/mL  OR       >80% decrease in PCT            Discourage initiation of                                            antibiotics      Encourage discontinuation           of antibiotics    ----------------------------     -----------------------------         PCT >= 0.50 ng/mL              PCT 0.26 - 0.50 ng/mL               AND        <80% decrease in PCT             Encourage initiation of                                             antibiotics       Encourage continuation           of antibiotics    ----------------------------     -----------------------------        PCT >= 0.50 ng/mL                  PCT > 0.50 ng/mL               AND         increase in PCT                  Strongly encourage                                      initiation of antibiotics    Strongly encourage escalation           of antibiotics                                     -----------------------------                                           PCT <= 0.25 ng/mL                                                 OR                                        > 80% decrease in PCT                                      Discontinue / Do not initiate  antibiotics  Performed at [facility], [place] [address] [place], [place] [address]   Glucose, capillary     Status: Abnormal   Collection Time: 05/26/21  5:42 AM  Result Value Ref Range   Glucose-Capillary 116 (H) 70 - 99 mg/dL    Comment: Glucose reference range applies only to samples taken after fasting for at least 8 hours.  Glucose, capillary     Status: Abnormal   Collection Time: 05/26/21 11:47 AM  Result Value Ref Range   Glucose-Capillary 106 (H) 70 - 99 mg/dL    Comment: Glucose reference range applies only to samples taken after fasting for at least 8 hours.  Glucose, capillary     Status: Abnormal    Collection Time: 05/26/21  4:43 PM  Result Value Ref Range   Glucose-Capillary 112 (H) 70 - 99 mg/dL    Comment: Glucose reference range applies only to samples taken after fasting for at least 8 hours.  Glucose, capillary     Status: Abnormal   Collection Time: 05/26/21 10:03 PM  Result Value Ref Range   Glucose-Capillary 114 (H) 70 - 99 mg/dL    Comment: Glucose reference range applies only to samples taken after fasting for at least 8 hours.  Glucose, capillary     Status: Abnormal   Collection Time: 05/27/21  5:43 AM  Result Value Ref Range   Glucose-Capillary 100 (H) 70 - 99 mg/dL    Comment: Glucose reference range applies only to samples taken after fasting for at least 8 hours.  Glucose, capillary     Status: Abnormal   Collection Time: 05/27/21 12:05 PM  Result Value Ref Range   Glucose-Capillary 116 (H) 70 - 99 mg/dL    Comment: Glucose reference range applies only to samples taken after fasting for at least 8 hours.  Glucose, capillary     Status: Abnormal   Collection Time: 05/27/21  5:06 PM  Result Value Ref Range   Glucose-Capillary 114 (H) 70 - 99 mg/dL    Comment: Glucose reference range applies only to samples taken after fasting for at least 8 hours.  Glucose, capillary     Status: None   Collection Time: 05/27/21  9:09 PM  Result Value Ref Range   Glucose-Capillary 93 70 - 99 mg/dL    Comment: Glucose reference range applies only to samples taken after fasting for at least 8 hours.  Glucose, capillary     Status: None   Collection Time: 05/28/21  6:38 AM  Result Value Ref Range   Glucose-Capillary 73 70 - 99 mg/dL    Comment: Glucose reference range applies only to samples taken after fasting for at least 8 hours.  Glucose, capillary     Status: Abnormal   Collection Time: 05/28/21 11:52 AM  Result Value Ref Range   Glucose-Capillary 124 (H) 70 - 99 mg/dL    Comment: Glucose reference range applies only to samples taken after fasting for at least 8 hours.   Glucose, capillary     Status: None   Collection Time: 05/28/21  4:58 PM  Result Value Ref Range   Glucose-Capillary 73 70 - 99 mg/dL    Comment: Glucose reference range applies only to samples taken after fasting for at least 8 hours.  Glucose, capillary     Status: Abnormal   Collection Time: 05/28/21  9:21 PM  Result Value Ref Range   Glucose-Capillary 180 (H) 70 - 99 mg/dL    Comment: Glucose reference range applies  only to samples taken after fasting for at least 8 hours.  Glucose, capillary     Status: None   Collection Time: 05/29/21  6:21 AM  Result Value Ref Range   Glucose-Capillary 95 70 - 99 mg/dL    Comment: Glucose reference range applies only to samples taken after fasting for at least 8 hours.  Glucose, capillary     Status: Abnormal   Collection Time: 05/29/21 12:25 PM  Result Value Ref Range   Glucose-Capillary 154 (H) 70 - 99 mg/dL    Comment: Glucose reference range applies only to samples taken after fasting for at least 8 hours.  Glucose, capillary     Status: Abnormal   Collection Time: 05/29/21  5:16 PM  Result Value Ref Range   Glucose-Capillary 118 (H) 70 - 99 mg/dL    Comment: Glucose reference range applies only to samples taken after fasting for at least 8 hours.  Glucose, capillary     Status: Abnormal   Collection Time: 05/29/21  8:49 PM  Result Value Ref Range   Glucose-Capillary 115 (H) 70 - 99 mg/dL    Comment: Glucose reference range applies only to samples taken after fasting for at least 8 hours.  Basic metabolic panel     Status: Abnormal   Collection Time: 05/30/21  5:44 AM  Result Value Ref Range   Sodium 133 (L) 135 - 145 mmol/L   Potassium 3.5 3.5 - 5.1 mmol/L   Chloride 103 98 - 111 mmol/L   CO2 25 22 - 32 mmol/L   Glucose, Bld 83 70 - 99 mg/dL    Comment: Glucose reference range applies only to samples taken after fasting for at least 8 hours.   BUN 19 6 - 20 mg/dL   Creatinine, Ser 0.81 0.61 - 1.24 mg/dL   Calcium 8.7 (L) 8.9 -  10.3 mg/dL   GFR, Estimated >60 >60 mL/min    Comment: (NOTE) Calculated using the CKD-EPI Creatinine Equation (2021)    Anion gap 5 5 - 15    Comment: Performed at [facility] [address] [place], [place] [address]  Glucose, capillary     Status: None   Collection Time: 05/30/21  5:47 AM  Result Value Ref Range   Glucose-Capillary 96 70 - 99 mg/dL    Comment: Glucose reference range applies only to samples taken after fasting for at least 8 hours.  Glucose, capillary     Status: Abnormal   Collection Time: 05/30/21 11:55 AM  Result Value Ref Range   Glucose-Capillary 117 (H) 70 - 99 mg/dL    Comment: Glucose reference range applies only to samples taken after fasting for at least 8 hours.  Glucose, capillary     Status: Abnormal   Collection Time: 05/30/21  5:00 PM  Result Value Ref Range   Glucose-Capillary 144 (H) 70 - 99 mg/dL    Comment: Glucose reference range applies only to samples taken after fasting for at least 8 hours.  Glucose, capillary     Status: Abnormal   Collection Time: 05/30/21  8:17 PM  Result Value Ref Range   Glucose-Capillary 141 (H) 70 - 99 mg/dL    Comment: Glucose reference range applies only to samples taken after fasting for at least 8 hours.  Glucose, capillary     Status: None   Collection Time: 05/31/21  6:37 AM  Result Value Ref Range   Glucose-Capillary 92 70 - 99 mg/dL    Comment: Glucose reference range  applies only to samples taken after fasting for at least 8 hours.  Glucose, capillary     Status: None   Collection Time: 05/31/21 11:40 AM  Result Value Ref Range   Glucose-Capillary 87 70 - 99 mg/dL    Comment: Glucose reference range applies only to samples taken after fasting for at least 8 hours.  Glucose, capillary     Status: Abnormal   Collection Time: 05/31/21  4:49 PM  Result Value Ref Range   Glucose-Capillary 103 (H) 70 - 99 mg/dL    Comment: Glucose reference range applies only to samples taken after fasting for at  least 8 hours.  Glucose, capillary     Status: Abnormal   Collection Time: 05/31/21  8:47 PM  Result Value Ref Range   Glucose-Capillary 137 (H) 70 - 99 mg/dL    Comment: Glucose reference range applies only to samples taken after fasting for at least 8 hours.  Glucose, capillary     Status: None   Collection Time: 06/01/21  6:05 AM  Result Value Ref Range   Glucose-Capillary 91 70 - 99 mg/dL    Comment: Glucose reference range applies only to samples taken after fasting for at least 8 hours.  Glucose, capillary     Status: Abnormal   Collection Time: 06/01/21 11:43 AM  Result Value Ref Range   Glucose-Capillary 110 (H) 70 - 99 mg/dL    Comment: Glucose reference range applies only to samples taken after fasting for at least 8 hours.  Glucose, capillary     Status: None   Collection Time: 06/01/21  4:42 PM  Result Value Ref Range   Glucose-Capillary 93 70 - 99 mg/dL    Comment: Glucose reference range applies only to samples taken after fasting for at least 8 hours.  Glucose, capillary     Status: Abnormal   Collection Time: 06/01/21  9:18 PM  Result Value Ref Range   Glucose-Capillary 160 (H) 70 - 99 mg/dL    Comment: Glucose reference range applies only to samples taken after fasting for at least 8 hours.  Glucose, capillary     Status: None   Collection Time: 06/02/21  6:21 AM  Result Value Ref Range   Glucose-Capillary 85 70 - 99 mg/dL    Comment: Glucose reference range applies only to samples taken after fasting for at least 8 hours.  Glucose, capillary     Status: Abnormal   Collection Time: 06/02/21 11:51 AM  Result Value Ref Range   Glucose-Capillary 114 (H) 70 - 99 mg/dL    Comment: Glucose reference range applies only to samples taken after fasting for at least 8 hours.  Glucose, capillary     Status: Abnormal   Collection Time: 06/02/21  4:53 PM  Result Value Ref Range   Glucose-Capillary 136 (H) 70 - 99 mg/dL    Comment: Glucose reference range applies only to  samples taken after fasting for at least 8 hours.  Glucose, capillary     Status: Abnormal   Collection Time: 06/02/21  8:58 PM  Result Value Ref Range   Glucose-Capillary 131 (H) 70 - 99 mg/dL    Comment: Glucose reference range applies only to samples taken after fasting for at least 8 hours.   Comment 1 Notify RN   Glucose, capillary     Status: None   Collection Time: 06/03/21  6:09 AM  Result Value Ref Range   Glucose-Capillary 76 70 - 99 mg/dL    Comment: Glucose reference  range applies only to samples taken after fasting for at least 8 hours.  CBC     Status: Abnormal   Collection Time: 06/12/21  6:49 PM  Result Value Ref Range   WBC 4.6 4.0 - 10.5 K/uL   RBC 4.07 (L) 4.22 - 5.81 MIL/uL   Hemoglobin 11.6 (L) 13.0 - 17.0 g/dL   HCT 36.7 (L) 39.0 - 52.0 %   MCV 90.2 80.0 - 100.0 fL   MCH 28.5 26.0 - 34.0 pg   MCHC 31.6 30.0 - 36.0 g/dL   RDW 14.1 11.5 - 15.5 %   Platelets 212 150 - 400 K/uL   nRBC 0.0 0.0 - 0.2 %    Comment: Performed at [facility], [address], [place], [place] [address]  Basic metabolic panel     Status: Abnormal   Collection Time: 06/12/21  6:49 PM  Result Value Ref Range   Sodium 134 (L) 135 - 145 mmol/L   Potassium 4.1 3.5 - 5.1 mmol/L   Chloride 102 98 - 111 mmol/L   CO2 23 22 - 32 mmol/L   Glucose, Bld 112 (H) 70 - 99 mg/dL    Comment: Glucose reference range applies only to samples taken after fasting for at least 8 hours.   BUN 20 6 - 20 mg/dL   Creatinine, Ser 0.76 0.61 - 1.24 mg/dL   Calcium 9.1 8.9 - 10.3 mg/dL   GFR, Estimated >60 >60 mL/min    Comment: (NOTE) Calculated using the CKD-EPI Creatinine Equation (2021)    Anion gap 9 5 - 15    Comment: Performed at [facility], [address] [place], [place] [address]  TSH     Status: None   Collection Time: 06/12/21  6:49 PM  Result Value Ref Range   TSH 1.253 0.350 - 4.500 uIU/mL    Comment: Performed by a 3rd Generation assay with a functional sensitivity of  <=0.01 uIU/mL. Performed at [facility], [address] [place], [place] [address]   Procalcitonin - Baseline     Status: None   Collection Time: 06/12/21  6:49 PM  Result Value Ref Range   Procalcitonin <0.10 ng/mL    Comment:        Interpretation: PCT (Procalcitonin) <= 0.5 ng/mL: Systemic infection (sepsis) is not likely. Local bacterial infection is possible. (NOTE)       Sepsis PCT Algorithm           Lower Respiratory Tract                                      Infection PCT Algorithm    ----------------------------     ----------------------------         PCT < 0.25 ng/mL                PCT < 0.10 ng/mL          Strongly encourage             Strongly discourage   discontinuation of antibiotics    initiation of antibiotics    ----------------------------     -----------------------------       PCT 0.25 - 0.50 ng/mL            PCT 0.10 - 0.25 ng/mL               OR       >80%  decrease in PCT            Discourage initiation of                                            antibiotics      Encourage discontinuation           of antibiotics    ----------------------------     -----------------------------         PCT >= 0.50 ng/mL              PCT 0.26 - 0.50 ng/mL               AND        <80% decrease in PCT             Encourage initiation of                                             antibiotics       Encourage continuation           of antibiotics    ----------------------------     -----------------------------        PCT >= 0.50 ng/mL                  PCT > 0.50 ng/mL               AND         increase in PCT                  Strongly encourage                                      initiation of antibiotics    Strongly encourage escalation           of antibiotics                                     -----------------------------                                           PCT <= 0.25 ng/mL                                                 OR                                         > 80% decrease in PCT                                      Discontinue / Do not initiate  antibiotics  Performed at [facility], [place], [place] [address]   Troponin I (High Sensitivity)     Status: None   Collection Time: 06/12/21 11:11 PM  Result Value Ref Range   Troponin I (High Sensitivity) 5 <18 ng/L    Comment: (NOTE) Elevated high sensitivity troponin I (hsTnI) values and significant  changes across serial measurements may suggest ACS but many other  chronic and acute conditions are known to elevate hsTnI results.  Refer to the "Links" section for chest pain algorithms and additional  guidance. Performed at [facility], [address] [place], [place] [address]   Urinalysis, Routine w reflex microscopic     Status: Abnormal   Collection Time: 06/12/21 11:47 PM  Result Value Ref Range   Color, Urine YELLOW (A) YELLOW   APPearance CLEAR (A) CLEAR   Specific Gravity, Urine 1.017 1.005 - 1.030   pH 5.0 5.0 - 8.0   Glucose, UA NEGATIVE NEGATIVE mg/dL   Hgb urine dipstick NEGATIVE NEGATIVE   Bilirubin Urine NEGATIVE NEGATIVE   Ketones, ur NEGATIVE NEGATIVE mg/dL   Protein, ur NEGATIVE NEGATIVE mg/dL   Nitrite NEGATIVE NEGATIVE   Leukocytes,Ua NEGATIVE NEGATIVE    Comment: Performed at [facility], [address] [place], [place] [address]    Assessment/Plan:  Pulmonary embolism [facility]) On anticoagulation  Essential hypertension blood pressure control important in reducing the progression of atherosclerotic disease. On appropriate oral medications.   Controlled type 2 diabetes mellitus with hyperglycemia, without long-term current use of insulin (HCC) blood glucose control important in reducing the progression of atherosclerotic disease. Also, involved in wound healing. On appropriate medications.   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  At this point, he seems to be doing pretty well.  Continue current medical regimen.  Recheck in 3 months with ABIs      [name] 07/01/2021, 11:15 AM   This note was created with Dragon medical transcription system.  Any errors from dictation are unintentional.

## 2021-07-01 NOTE — Assessment & Plan Note (Signed)
blood glucose control important in reducing the progression of atherosclerotic disease. Also, involved in wound healing. On appropriate medications.  

## 2021-07-01 NOTE — Assessment & Plan Note (Signed)
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  At this point, he seems to be doing pretty well.  Continue current medical regimen.  Recheck in 3 months with ABIs

## 2021-07-01 NOTE — Assessment & Plan Note (Signed)
blood pressure control important in reducing the progression of atherosclerotic disease. On appropriate oral medications.  

## 2021-07-01 NOTE — Assessment & Plan Note (Signed)
On anticoagulation 

## 2021-07-13 DIAGNOSIS — Z23 Encounter for immunization: Secondary | ICD-10-CM

## 2021-07-15 ENCOUNTER — Encounter: Payer: Self-pay | Admitting: Nurse Practitioner

## 2021-07-15 ENCOUNTER — Encounter: Payer: Medicare Other | Attending: Physical Medicine & Rehabilitation | Admitting: Physical Medicine & Rehabilitation

## 2021-07-15 ENCOUNTER — Ambulatory Visit (INDEPENDENT_AMBULATORY_CARE_PROVIDER_SITE_OTHER): Payer: Medicare Other | Admitting: Nurse Practitioner

## 2021-07-15 VITALS — BP 110/72 | HR 75 | Ht 66.0 in | Wt 144.5 lb

## 2021-07-15 DIAGNOSIS — E1151 Type 2 diabetes mellitus with diabetic peripheral angiopathy without gangrene: Secondary | ICD-10-CM

## 2021-07-15 DIAGNOSIS — E785 Hyperlipidemia, unspecified: Secondary | ICD-10-CM | POA: Diagnosis not present

## 2021-07-15 DIAGNOSIS — I10 Essential (primary) hypertension: Secondary | ICD-10-CM | POA: Diagnosis not present

## 2021-07-15 DIAGNOSIS — I2489 Other forms of acute ischemic heart disease: Secondary | ICD-10-CM

## 2021-07-15 DIAGNOSIS — I248 Other forms of acute ischemic heart disease: Secondary | ICD-10-CM | POA: Diagnosis not present

## 2021-07-15 DIAGNOSIS — I2699 Other pulmonary embolism without acute cor pulmonale: Secondary | ICD-10-CM

## 2021-07-15 DIAGNOSIS — I639 Cerebral infarction, unspecified: Secondary | ICD-10-CM

## 2021-07-15 DIAGNOSIS — I5189 Other ill-defined heart diseases: Secondary | ICD-10-CM | POA: Insufficient documentation

## 2021-07-15 DIAGNOSIS — I739 Peripheral vascular disease, unspecified: Secondary | ICD-10-CM

## 2021-07-19 ENCOUNTER — Inpatient Hospital Stay: Payer: Medicare Other | Admitting: Neurology

## 2021-07-26 ENCOUNTER — Encounter: Payer: Self-pay | Admitting: Emergency Medicine

## 2021-07-26 ENCOUNTER — Other Ambulatory Visit: Payer: Self-pay

## 2021-07-26 ENCOUNTER — Emergency Department
Admission: EM | Admit: 2021-07-26 | Discharge: 2021-07-26 | Disposition: A | Discharge status: Home / Self Care | Payer: Medicare Other | Attending: Emergency Medicine | Admitting: Emergency Medicine

## 2021-07-26 ENCOUNTER — Emergency Department: Payer: Medicare Other

## 2021-07-26 DIAGNOSIS — R531 Weakness: Secondary | ICD-10-CM | POA: Diagnosis present

## 2021-07-26 DIAGNOSIS — N390 Urinary tract infection, site not specified: Secondary | ICD-10-CM | POA: Diagnosis not present

## 2021-07-26 DIAGNOSIS — Y92009 Unspecified place in unspecified non-institutional (private) residence as the place of occurrence of the external cause: Secondary | ICD-10-CM | POA: Insufficient documentation

## 2021-07-26 DIAGNOSIS — W050XXA Fall from non-moving wheelchair, initial encounter: Secondary | ICD-10-CM | POA: Diagnosis not present

## 2021-07-26 LAB — URINALYSIS, ROUTINE W REFLEX MICROSCOPIC
Bilirubin Urine: NEGATIVE
Glucose, UA: 50 mg/dL — AB
Hgb urine dipstick: NEGATIVE
Ketones, ur: NEGATIVE mg/dL
Nitrite: NEGATIVE
Protein, ur: NEGATIVE mg/dL
Specific Gravity, Urine: 1.017 (ref 1.005–1.030)
Squamous Epithelial / HPF: NONE SEEN (ref 0–5)
WBC, UA: >50 WBC/hpf — ABNORMAL HIGH (ref 0–5)
pH: 5 (ref 5.0–8.0)

## 2021-07-26 LAB — CBC WITH DIFFERENTIAL/PLATELET
Abs Immature Granulocytes: 0.04 10*3/uL (ref 0.00–0.07)
Basophils Absolute: 0 10*3/uL (ref 0.0–0.1)
Basophils Relative: 0 %
Eosinophils Absolute: 0.1 10*3/uL (ref 0.0–0.5)
Eosinophils Relative: 1 %
HCT: 38.3 % — ABNORMAL LOW (ref 39.0–52.0)
Hemoglobin: 12.5 g/dL — ABNORMAL LOW (ref 13.0–17.0)
Immature Granulocytes: 0 %
Lymphocytes Relative: 18 %
Lymphs Abs: 1.6 10*3/uL (ref 0.7–4.0)
MCH: 29.8 pg (ref 26.0–34.0)
MCHC: 32.6 g/dL (ref 30.0–36.0)
MCV: 91.2 fL (ref 80.0–100.0)
Monocytes Absolute: 0.6 10*3/uL (ref 0.1–1.0)
Monocytes Relative: 6 %
Neutro Abs: 6.7 10*3/uL (ref 1.7–7.7)
Neutrophils Relative %: 75 %
Platelets: 193 10*3/uL (ref 150–400)
RBC: 4.2 MIL/uL — ABNORMAL LOW (ref 4.22–5.81)
RDW: 14.1 % (ref 11.5–15.5)
WBC: 9 10*3/uL (ref 4.0–10.5)
nRBC: 0 % (ref 0.0–0.2)

## 2021-07-26 LAB — BASIC METABOLIC PANEL
Anion gap: 7 (ref 5–15)
BUN: 16 mg/dL (ref 6–20)
CO2: 28 mmol/L (ref 22–32)
Calcium: 9.4 mg/dL (ref 8.9–10.3)
Chloride: 102 mmol/L (ref 98–111)
Creatinine, Ser: 0.73 mg/dL (ref 0.61–1.24)
GFR, Estimated: >60 mL/min (ref >60)
Glucose, Bld: 185 mg/dL — ABNORMAL HIGH (ref 70–99)
Potassium: 3.3 mmol/L — ABNORMAL LOW (ref 3.5–5.1)
Sodium: 137 mmol/L (ref 135–145)

## 2021-07-26 LAB — TROPONIN I (HIGH SENSITIVITY): Troponin I (High Sensitivity): 17 ng/L (ref <18)

## 2021-07-26 LAB — CK: Total CK: 247 U/L (ref 49–397)

## 2021-07-26 MED ORDER — CEPHALEXIN 500 MG PO CAPS: 500.0000 mg | ORAL_CAPSULE | Freq: Four times a day (QID) | ORAL | 0 refills | Status: AC

## 2021-07-26 MED ORDER — SODIUM CHLORIDE 0.9 % IV BOLUS
500.0000 mL | Freq: Once | INTRAVENOUS | Status: AC
Start: 2021-07-26 — End: 2021-07-26
  Administered 2021-07-26: 500 mL via INTRAVENOUS

## 2021-07-26 MED ORDER — CEPHALEXIN 500 MG PO CAPS: 500.0000 mg | ORAL_CAPSULE | Freq: Four times a day (QID) | ORAL | 0 refills | Status: DC

## 2021-07-26 MED ORDER — SODIUM CHLORIDE 0.9 % IV SOLN
1.0000 g | Freq: Once | INTRAVENOUS | Status: AC
  Administered 2021-07-26: 1 g via INTRAVENOUS
  Filled 2021-07-26: qty 10

## 2021-07-26 NOTE — Discharge Instructions (Signed)
Please seek medical attention for any high fevers, chest pain, shortness of breath, change in behavior, persistent vomiting, bloody stool or any other new or concerning symptoms.  

## 2021-07-26 NOTE — ED Notes (Signed)
Pt arrived to ED in urine soaked clothes. Pt cleaned up by this Probation officer and [place], NT with no issue. Pt placed on male purewick and cardiac monitor.

## 2021-07-26 NOTE — ED Notes (Signed)
Dr Archie Balboa spoke with family again and provided paper prescriptions.

## 2021-07-26 NOTE — ED Notes (Signed)
Arranging for ambulance transport to bring pt home for discharge.

## 2021-07-26 NOTE — ED Notes (Signed)
Pt resting in bed, no needs verbalized at this time

## 2021-07-26 NOTE — ED Triage Notes (Signed)
Pt here via [facility] from home with a fall last night. Pt stays downstairs and is primarily wheelchair bound but was trying to ise the bathroom and loss his balance and fell at some time during the night. Pt denies any pain. Pt has hx of 3 strokes, last one in June with right side deficits.. Pt has hx of htn-did not take medication today. Pt does have foul smelling urine.   97.4 176/94 80 99% RA 200-cbg

## 2021-07-26 NOTE — ED Notes (Signed)
Discharge paperwork provided and reviewed with patient. Followup and RX info reviewed as applicable. Pt provides verbal consent for dc at this time and declines vs at time of dc. pt to lobby alert and oriented. 

## 2021-07-26 NOTE — ED Provider Notes (Signed)
[facility] Provider Note    Event Date/Time   First MD Initiated Contact with Patient 07/26/21 [phone]     (approximate)   History   Fall   HPI  [name] is a 61 y.o. male  who, per cardiology note dated 07/15/21 who has history of stroke, PE, who presents to the emergency department today because of concern for weakness and fall. The patient says he was trying to use the bathroom last night when he fell getting out of his wheelchair. Normally he does not have a problem with that but felt weak. Was unable to get himself off the ground. The patient denies any associated chest pain or palpitations. Denies any fevers or shortness of breath. He is complaining of some neck pain, denies LOC.  Physical Exam   Triage Vital Signs: ED Triage Vitals  Enc Vitals Group     BP 07/26/21 0922 (!) 184/49     Pulse Rate 07/26/21 0919 79     Resp 07/26/21 0919 18     Temp 07/26/21 0919 98.7 F (37.1 C)     Temp Source 07/26/21 0919 Oral     SpO2 07/26/21 0919 98 %     Weight 07/26/21 0921 144 lb 6.4 oz (65.5 kg)     Height 07/26/21 0921 5\' 6"  (1.676 m)     Head Circumference --      Peak Flow --      Pain Score 07/26/21 0921 0     Pain Loc --      Pain Edu? --      Excl. in [place]? --     Most recent vital signs: Vitals:   07/26/21 0919 07/26/21 0922  BP:  (!) 184/49  Pulse: 79   Resp: 18   Temp: 98.7 F (37.1 C)   SpO2: 98%    General: Awake, alert, oriented. CV:  Good peripheral perfusion. Regular rate and rhythm. Resp:  Normal effort. Lungs clear. Abd:  No distention.     ED Results / Procedures / Treatments   Labs (all labs ordered are listed, but only abnormal results are displayed) Labs Reviewed  URINALYSIS, ROUTINE W REFLEX MICROSCOPIC - Abnormal; Notable for the following components:      Result Value   APPearance CLOUDY (*)    Glucose, UA 50 (*)    Leukocytes,Ua LARGE (*)    WBC, UA >50 (*)    Bacteria, UA MANY (*)    All other  components within normal limits  BASIC METABOLIC PANEL - Abnormal; Notable for the following components:   Potassium 3.3 (*)    Glucose, Bld 185 (*)    All other components within normal limits  CBC WITH DIFFERENTIAL/PLATELET - Abnormal; Notable for the following components:   RBC 4.20 (*)    Hemoglobin 12.5 (*)    HCT 38.3 (*)    All other components within normal limits  URINE CULTURE  CK  TROPONIN I (HIGH SENSITIVITY)  TROPONIN I (HIGH SENSITIVITY)     EKG  I, Nance Pear, attending physician, personally viewed and interpreted this EKG  EKG Time: 0951 Rate: 73 Rhythm: normal sinus rhythm Axis: normal Intervals: qtc 387 QRS: narrow ST changes: no st elevation Impression: normal ekg  RADIOLOGY I independently interpreted and visualized the ct head/cervical spine. My interpretation: No bleed. No acute abnormality Radiology interpretation:  IMPRESSION:  1. No acute intracranial abnormality. No intracranial mass,  hemorrhage or edema. No skull fracture.  2. 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  3. No fracture or acute subluxation within the cervical spine.  Scoliosis, moderate in degree.  4. Carotid atherosclerosis.     PROCEDURES:  Critical Care performed: No  Procedures   MEDICATIONS ORDERED IN ED: Medications - No data to display   IMPRESSION / MDM / [place] / ED COURSE  I reviewed the triage vital signs and the nursing notes.                              Differential diagnosis includes, but is not limited to, anemia, electrolyte abnormality, infection, ACS.  Patient's presentation is most consistent with acute presentation with potential threat to life or bodily function.  Patient presented to the emergency department today because of concerns for weakness and falling out of his wheelchair.  On exam patient is awake alert and oriented.  Sequelae of previous strokes apparent.   Patient had a head and cervical spine CT performed which did not show any acute findings.  Blood work without any leukocytosis or significant anemia.  Urine is concerning for infection.  Patient was given IV antibiotics here in the emergency department.  I did offer admission to the patient however he stated he would like to try going home.  This point given lack of leukocytosis or fever I think it is reasonable for patient to try to go home.  He does have family that lives with him that can help take care of him.  Did discuss with sister at bedside as well.  Did encourage return for any worsening or new symptoms.   FINAL CLINICAL IMPRESSION(S) / ED DIAGNOSES   Final diagnoses:  Lower urinary tract infectious disease  Weakness      Note:  This document was prepared using Dragon voice recognition software and may include unintentional dictation errors.    Nance Pear, MD 07/26/21 [phone]

## 2021-07-29 LAB — URINE CULTURE: Culture: >=100000 — AB

## 2021-09-29 ENCOUNTER — Other Ambulatory Visit (INDEPENDENT_AMBULATORY_CARE_PROVIDER_SITE_OTHER): Payer: Self-pay | Admitting: Vascular Surgery

## 2021-09-29 DIAGNOSIS — I739 Peripheral vascular disease, unspecified: Secondary | ICD-10-CM

## 2021-09-30 ENCOUNTER — Encounter (INDEPENDENT_AMBULATORY_CARE_PROVIDER_SITE_OTHER): Payer: Medicare Other

## 2021-09-30 ENCOUNTER — Encounter (INDEPENDENT_AMBULATORY_CARE_PROVIDER_SITE_OTHER): Payer: Self-pay

## 2021-09-30 ENCOUNTER — Ambulatory Visit (INDEPENDENT_AMBULATORY_CARE_PROVIDER_SITE_OTHER): Payer: MEDICAID | Admitting: Nurse Practitioner

## 2021-10-14 ENCOUNTER — Ambulatory Visit: Payer: Medicare Other | Attending: Nurse Practitioner | Admitting: Nurse Practitioner

## 2021-12-21 ENCOUNTER — Encounter: Payer: Medicare Other | Attending: Internal Medicine | Admitting: Internal Medicine

## 2021-12-21 ENCOUNTER — Ambulatory Visit (INDEPENDENT_AMBULATORY_CARE_PROVIDER_SITE_OTHER): Payer: Medicare Other

## 2021-12-21 ENCOUNTER — Ambulatory Visit (INDEPENDENT_AMBULATORY_CARE_PROVIDER_SITE_OTHER): Payer: Medicare Other | Admitting: Nurse Practitioner

## 2021-12-21 ENCOUNTER — Telehealth (INDEPENDENT_AMBULATORY_CARE_PROVIDER_SITE_OTHER): Payer: Self-pay

## 2021-12-21 ENCOUNTER — Encounter (INDEPENDENT_AMBULATORY_CARE_PROVIDER_SITE_OTHER): Payer: Self-pay | Admitting: Nurse Practitioner

## 2021-12-21 ENCOUNTER — Encounter (INDEPENDENT_AMBULATORY_CARE_PROVIDER_SITE_OTHER): Payer: Self-pay

## 2021-12-21 VITALS — BP 146/82 | HR 79 | Resp 16

## 2021-12-21 DIAGNOSIS — E1151 Type 2 diabetes mellitus with diabetic peripheral angiopathy without gangrene: Secondary | ICD-10-CM | POA: Insufficient documentation

## 2021-12-21 DIAGNOSIS — E119 Type 2 diabetes mellitus without complications: Secondary | ICD-10-CM | POA: Diagnosis not present

## 2021-12-21 DIAGNOSIS — I998 Other disorder of circulatory system: Secondary | ICD-10-CM

## 2021-12-21 DIAGNOSIS — X58XXXA Exposure to other specified factors, initial encounter: Secondary | ICD-10-CM | POA: Insufficient documentation

## 2021-12-21 DIAGNOSIS — Z9889 Other specified postprocedural states: Secondary | ICD-10-CM

## 2021-12-21 DIAGNOSIS — E1140 Type 2 diabetes mellitus with diabetic neuropathy, unspecified: Secondary | ICD-10-CM | POA: Insufficient documentation

## 2021-12-21 DIAGNOSIS — S91104A Unspecified open wound of right lesser toe(s) without damage to nail, initial encounter: Secondary | ICD-10-CM | POA: Diagnosis not present

## 2021-12-21 DIAGNOSIS — G40909 Epilepsy, unspecified, not intractable, without status epilepticus: Secondary | ICD-10-CM | POA: Insufficient documentation

## 2021-12-21 DIAGNOSIS — N186 End stage renal disease: Secondary | ICD-10-CM | POA: Diagnosis not present

## 2021-12-21 DIAGNOSIS — I509 Heart failure, unspecified: Secondary | ICD-10-CM | POA: Diagnosis not present

## 2021-12-21 DIAGNOSIS — E1122 Type 2 diabetes mellitus with diabetic chronic kidney disease: Secondary | ICD-10-CM | POA: Diagnosis not present

## 2021-12-21 DIAGNOSIS — I739 Peripheral vascular disease, unspecified: Secondary | ICD-10-CM

## 2021-12-21 DIAGNOSIS — Z89411 Acquired absence of right great toe: Secondary | ICD-10-CM | POA: Insufficient documentation

## 2021-12-21 DIAGNOSIS — I10 Essential (primary) hypertension: Secondary | ICD-10-CM

## 2021-12-21 DIAGNOSIS — I132 Hypertensive heart and chronic kidney disease with heart failure and with stage 5 chronic kidney disease, or end stage renal disease: Secondary | ICD-10-CM | POA: Insufficient documentation

## 2021-12-21 DIAGNOSIS — I70235 Atherosclerosis of native arteries of right leg with ulceration of other part of foot: Secondary | ICD-10-CM | POA: Diagnosis not present

## 2021-12-21 DIAGNOSIS — F17200 Nicotine dependence, unspecified, uncomplicated: Secondary | ICD-10-CM | POA: Diagnosis not present

## 2021-12-21 DIAGNOSIS — E11621 Type 2 diabetes mellitus with foot ulcer: Secondary | ICD-10-CM | POA: Insufficient documentation

## 2021-12-21 NOTE — Telephone Encounter (Signed)
Patient was seen today and scheduled for a RLE angio with Dr. Lucky Cowboy on 12/23/21 with [facility] 11:[facility]. Pre-procedure instructions were discussed and were handed to the patient.

## 2021-12-22 ENCOUNTER — Encounter (INDEPENDENT_AMBULATORY_CARE_PROVIDER_SITE_OTHER): Payer: Self-pay | Admitting: Nurse Practitioner
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
## 2021-12-22 NOTE — Progress Notes (Addendum)
CAMARION, WEIER ([id number]) [id number] Page 1 of 9 Visit Report for 12/21/2021 Allergy List Details Patient Name: Date of Service: [place], [name] 12/21/2021 8:45 A M Medical Record Number: [id number] Patient Account Number: [id number] Date of Birth/Sex: Treating RN: 05/09/1960 (61 y.o. [name]) Carlene Coria Primary Care Esther Bradstreet: Kerri Perches Other Clinician: Referring Swathi Dauphin: Treating Crysten Kaman/Extender: Urban Gibson in Treatment: 0 Allergies Active Allergies No Known Drug Allergies Allergy Notes Electronic Signature(s) Signed: 12/22/2021 4:40:57 PM By: Carlene Coria RN Entered By: Carlene Coria on 12/21/2021 09:00:35 -------------------------------------------------------------------------------- [place] Details Patient Name: Date of Service: [place], [name] 12/21/2021 8:45 A M Medical Record Number: [id number] Patient Account Number: [id number] Date of Birth/Sex: Treating RN: [birth year] (61 y.o. [name]) Carlene Coria Primary Care Yordan Martindale: Kerri Perches Other Clinician: Referring Navon Kotowski: Treating Delores Edelstein/Extender: Urban Gibson in Treatment: 0 Visit Information Patient Arrived: Wheel Chair Arrival Time: 08:58 Accompanied By: caregiver Transfer Assistance: None Patient Identification Verified: Yes Secondary Verification Process Completed: Yes Patient Requires Transmission-Based Precautions: No Patient Has Alerts: Yes Patient Alerts: Patient on [place], [place] ([id number]) History Since Last Visit All ordered tests and consults were completed: No Added or deleted any medications: No Any new allergies or adverse reactions: No Had a fall or experienced change in activities of daily living that may affect risk of falls: No Signs or symptoms of abuse/neglect since last visito No Hospitalized since last visit: No Implantable device outside of the clinic excluding cellular tissue  based products placed in the center since last visit: No Has Dressing in Place as Prescribed: Yes Pain Present Now: Yes [id number] Page 2 of 9 Electronic Signature(s) Signed: 12/22/2021 4:40:57 PM By: Carlene Coria RN Entered By: Carlene Coria on 12/21/2021 09:08:59 -------------------------------------------------------------------------------- Clinic Level of Care Assessment Details Patient Name: Date of Service: [place], [name] 12/21/2021 8:45 A M Medical Record Number: [id number] Patient Account Number: [id number] Date of Birth/Sex: Treating RN: 09/22/1960 (61 y.o. [name]) Carlene Coria Primary Care Sang Blount: Kerri Perches Other Clinician: Referring Khori Underberg: Treating Ramone Gander/Extender: Urban Gibson in Treatment: 0 Clinic Level of Care Assessment Items TOOL 2 Quantity Score X- 1 0 Use when only an EandM is performed on the INITIAL visit ASSESSMENTS - Nursing Assessment / Reassessment X- 1 20 General Physical Exam (combine w/ comprehensive assessment (listed just below) when performed on new pt. evals) X- 1 25 Comprehensive Assessment (HX, ROS, Risk Assessments, Wounds Hx, etc.) ASSESSMENTS - Wound and Skin A ssessment / Reassessment X - Simple Wound Assessment / Reassessment - one wound 1 5 []  - 0 Complex Wound Assessment / Reassessment - multiple wounds []  - 0 Dermatologic / Skin Assessment (not related to wound area) ASSESSMENTS - Ostomy and/or Continence Assessment and Care []  - 0 Incontinence Assessment and Management []  - 0 Ostomy Care Assessment and Management (repouching, etc.) PROCESS - Coordination of Care X - Simple Patient / Family Education for ongoing care 1 15 []  - 0 Complex (extensive) Patient / Family Education for ongoing care []  - 0 Staff obtains Programmer, systems, Records, T Results / Process Orders est []  - 0 Staff telephones HHA, Nursing Homes / Clarify orders / etc []  - 0 Routine Transfer to another Facility  (non-emergent condition) []  - 0 Routine Hospital Admission (non-emergent condition) X- 1 15 New Admissions / Biomedical engineer / Ordering NPWT Apligraf, etc. , []  - 0 Emergency Hospital Admission (emergent condition) X- 1 10 Simple Discharge Coordination []  - 0 Complex (extensive) Discharge Coordination PROCESS - Special Needs []  - 0  Pediatric / Minor Patient Management []  - 0 Isolation Patient Management []  - 0 Hearing / Language / Visual special needs []  - 0 Assessment of Community assistance (transportation, D/C planning, etc.) [place], Saralyn Pilar ([id number]) [id number] Page 3 of 9 []  - 0 Additional assistance / Altered mentation []  - 0 Support Surface(s) Assessment (bed, cushion, seat, etc.) INTERVENTIONS - Wound Cleansing / Measurement X- 1 5 Wound Imaging (photographs - any number of wounds) []  - 0 Wound Tracing (instead of photographs) X- 1 5 Simple Wound Measurement - one wound []  - 0 Complex Wound Measurement - multiple wounds X- 1 5 Simple Wound Cleansing - one wound []  - 0 Complex Wound Cleansing - multiple wounds INTERVENTIONS - Wound Dressings []  - 0 Small Wound Dressing one or multiple wounds []  - 0 Medium Wound Dressing one or multiple wounds []  - 0 Large Wound Dressing one or multiple wounds []  - 0 Application of Medications - injection INTERVENTIONS - Miscellaneous []  - 0 External ear exam []  - 0 Specimen Collection (cultures, biopsies, blood, body fluids, etc.) []  - 0 Specimen(s) / Culture(s) sent or taken to Lab for analysis []  - 0 Patient Transfer (multiple staff / Civil Service fast streamer / Similar devices) []  - 0 Simple Staple / Suture removal (25 or less) []  - 0 Complex Staple / Suture removal (26 or more) []  - 0 Hypo / Hyperglycemic Management (close monitor of Blood Glucose) X- 1 15 Ankle / Brachial Index (ABI) - do not check if billed separately Has the patient been seen at the hospital within the last three  years: Yes Total Score: 120 Level Of Care: New/Established - Level 4 Electronic Signature(s) Signed: 12/22/2021 4:40:57 PM By: Carlene Coria RN Entered By: Carlene Coria on 12/21/2021 10:45:07 -------------------------------------------------------------------------------- Encounter Discharge Information Details Patient Name: Date of Service: [place], [place] TRICK 12/21/2021 8:45 A M Medical Record Number: [id number] Patient Account Number: [id number] Date of Birth/Sex: Treating RN: 10/04/1960 (61 y.o. Oval Linsey Primary Care Clarence Cogswell: Kerri Perches Other Clinician: Referring Andreia Gandolfi: Treating Tanyia Grabbe/Extender: Urban Gibson in Treatment: 0 Encounter Discharge Information Items Discharge Condition: Stable Ambulatory Status: Wheelchair Discharge Destination: Home Transportation: Private Auto Accompanied By: sister ZACKRY, DEINES ([id number]) [id number] Page 4 of 9 Schedule Follow-up Appointment: Yes Clinical Summary of Care: Electronic Signature(s) Signed: 12/21/2021 10:47:50 AM By: Carlene Coria RN Entered By: Carlene Coria on 12/21/2021 10:47:50 -------------------------------------------------------------------------------- Lower Extremity Assessment Details Patient Name: Date of Service: [place], [name] 12/21/2021 8:45 A M Medical Record Number: [id number] Patient Account Number: [id number] Date of Birth/Sex: Treating RN: [birth year] (61 y.o. [name]) Carlene Coria Primary Care Gertrude Bucks: Kerri Perches Other Clinician: Referring Emili Mcloughlin: Treating Monie Shere/Extender: Urban Gibson in Treatment: 0 Edema Assessment Assessed: Shirlyn Goltz: No] Patrice Paradise: No] Edema: [Left: N] [Right: o] Calf Left: Right: Point of Measurement: 32 cm From Medial Instep 29 cm Ankle Left: Right: Point of Measurement: 10 cm From Medial Instep 19 cm Knee To Floor Left: Right: From Medial Instep 42 cm Vascular Assessment Pulses: Dorsalis  Pedis Palpable: [Right:No] Doppler Audible: [Right:Inaudible] Posterior Tibial Palpable: [Right:No Inaudible] Notes unable to hear pulse in foot or ankle , notified Dr Heber Tetherow, appointment made with Dr Ozella Almond office for today at11am for ultrasound Electronic Signature(s) Signed: 12/21/2021 10:47:01 AM By: Carlene Coria RN Entered By: Carlene Coria on 12/21/2021 10:47:00 [name] ([id number]) [id number] Page 5 of 9 -------------------------------------------------------------------------------- Multi Wound Chart Details Patient Name: Date of Service: [place], [name] 12/21/2021 8:45 A M Medical Record Number: [id number] Patient Account Number: [id number] Date of  Birth/Sex: Treating RN: [birth year] (61 y.o. 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 Congestive Heart Failure, N/A N/A Comorbid History: Hypertension, Peripheral Arterial Disease, Peripheral Venous Disease, Type II Diabetes, End Stage Renal Disease, Osteoarthritis, Neuropathy, Seizure Disorder 12/05/2021 N/A N/A Date Acquired: 0 N/A N/A Weeks of Treatment: Open N/A N/A Wound Status: No N/A N/A Wound Recurrence: 3x6x0.1 N/A N/A Measurements L x W x D (cm) 14.137 N/A N/A A (cm) : rea 1.414 N/A N/A Volume (cm) : Grade 1 N/A N/A Classification: None Present N/A N/A Exudate A mount: None Present (0%) N/A N/A Granulation A mount: Large (67-100%) N/A N/A Necrotic A mount: Eschar,  Adherent Slough N/A N/A Necrotic Tissue: Fascia: No N/A N/A Exposed Structures: Fat Layer (Subcutaneous Tissue): No Tendon: No Muscle: No Joint: No Bone: No None N/A N/A Epithelialization: Treatment Notes Electronic Signature(s) Signed: 12/21/2021 10:21:51 AM By: Kalman Shan DO Entered By: Kalman Shan on 12/21/2021 10:11:55 [name] ([id number]) [id number] Page 6 of 9 -------------------------------------------------------------------------------- Multi-Disciplinary Care Plan Details Patient Name: Date of Service: [place], [name] 12/21/2021 8:45 A M Medical Record Number: [id number] Patient Account Number: [id number] Date of Birth/Sex: Treating RN: 10-08-1960 (61 y.o. [name]) Carlene Coria Primary Care Scarlettrose Costilow: Kerri Perches Other Clinician: Referring Brooke Steinhilber: Treating Jacqulin Brandenburger/Extender: Urban Gibson in Treatment: 0 Active Inactive Electronic Signature(s) Signed: 12/30/2021 10:06:13 AM By: Gretta Cool, BSN, RN, CWS, Kim RN, BSN Signed: 01/12/2022 3:47:43 PM By: Carlene Coria RN Previous Signature: 12/22/2021 4:40:57 PM Version By: Carlene Coria RN Entered By: Gretta Cool, BSN, RN, CWS, Kim on 12/30/2021 10:06:13 -------------------------------------------------------------------------------- Pain Assessment Details Patient Name: Date of Service: [place], [name] 12/21/2021 8:45 A M Medical Record Number: [id number] Patient Account Number: [id number] Date of Birth/Sex: Treating RN: 07/28/1960 (61 y.o. Oval Linsey Primary Care Maiko Salais: Kerri Perches Other Clinician: Referring Sherra Kimmons: Treating Axyl Sitzman/Extender: Urban Gibson in Treatment: 0 Active Problems Location of Pain Severity and Description of Pain Patient Has Paino Yes Site Locations With Dressing Change: Yes Duration of the Pain. Constant / Intermittento Intermittent How Long Does it Lasto Hours: Minutes: 15 Rate the pain. Current  Pain Level: 3 Worst Pain Level: 4 Least Pain Level: 0 Tolerable Pain Level: [place], Alisha ([id number]) [id number] Page 7 of 9 Character of Pain Describe the Pain: Burning, Throbbing Pain Management and Medication Current Pain Management: Medication: Yes Cold Application: No Rest: Yes Massage: No Activity: No T.E.N.S.: No Heat Application: No Leg drop or elevation: No Is the Current Pain Management Adequate: Inadequate How does your wound impact your activities of daily livingo Sleep: No Bathing: No Appetite: No Relationship With Others: No Bladder Continence: No Emotions: No Bowel Continence: No Work: No Toileting: No Drive: No Dressing: No Hobbies: No Electronic Signature(s) Signed: 12/22/2021 4:40:57 PM By: Carlene Coria RN Entered By: Carlene Coria on 12/21/2021 08:59:46 -------------------------------------------------------------------------------- Patient/Caregiver Education Details Patient Name: Date of Service: [place], [place] TRICK 11/29/2023andnbsp8:45 A M Medical Record Number: [id number] Patient Account Number: [id number] Date of Birth/Gender: Treating RN: [birth year] (61 y.o. Oval Linsey Primary Care Physician: Kerri Perches Other Clinician: Referring Physician: Treating Physician/Extender: Urban Gibson in Treatment: 0 Education Assessment Education  Provided To: Patient Education Topics Provided [place]: o Methods: Explain/Verbal Responses: State content correctly Electronic Signature(s) Signed: 12/22/2021 4:40:57 PM By: Carlene Coria RN Entered By: Carlene Coria on 12/21/2021 10:45:36 [name] ([id number]) [id number] Page 8 of 9 -------------------------------------------------------------------------------- Wound Assessment Details Patient Name: Date of Service: [place], [name] 12/21/2021 8:45 A M Medical Record Number: [id number] Patient  Account Number: [id number] Date of Birth/Sex: Treating RN: 04-16-1960 (61 y.o. [name]) Carlene Coria Primary Care Shakayla Hickox: Kerri Perches Other Clinician: Referring Azya Barbero: Treating Alizaya Oshea/Extender: Urban Gibson in Treatment: 0 Wound Status Wound Number: 2 Primary Diabetic Wound/Ulcer of the Lower Extremity Etiology: Wound Location: Right, Circumferential T Third oe Wound Open Wounding Event: Gradually Appeared Status: Date Acquired: 12/05/2021 Comorbid Congestive Heart Failure, Hypertension, Peripheral Arterial Disease, Weeks Of Treatment: 0 History: Peripheral Venous Disease, Type II Diabetes, End Stage Renal Clustered Wound: No Disease, Osteoarthritis, Neuropathy, Seizure Disorder Photos Wound Measurements Length: (cm) 3 Width: (cm) 6 Depth: (cm) 0.1 Area: (cm) 14.137 Volume: (cm) 1.414 % Reduction in Area: % Reduction in Volume: Epithelialization: None Tunneling: No Undermining: No Wound Description Classification: Grade 1 Exudate Amount: None Present Foul Odor After Cleansing: No Slough/Fibrino Yes Wound Bed Granulation Amount: None Present (0%) Exposed Structure Necrotic Amount: Large (67-100%) Fascia Exposed: No Necrotic Quality: Eschar, Adherent Slough Fat Layer (Subcutaneous Tissue) Exposed: No Tendon Exposed: No Muscle Exposed: No Joint Exposed: No Bone Exposed: No Electronic Signature(s) Signed: 12/22/2021 4:40:57 PM By: Carlene Coria RN Entered By: Carlene Coria on 12/21/2021 09:27:19 [name] ([id number]) [id number] Page 9 of 9 -------------------------------------------------------------------------------- Vitals Details Patient Name: Date of Service: [place], [name] 12/21/2021 8:45 A M Medical Record Number: [id number] Patient Account Number: [id number] Date of Birth/Sex: Treating RN: Sep 09, 1960 (61 y.o. [name]) Carlene Coria Primary Care Lerlene Treadwell: Kerri Perches Other Clinician: Referring  Jereld Presti: Treating Nargis Abrams/Extender: Urban Gibson in Treatment: 0 Vital Signs Time Taken: 08:50 Temperature (F): 98.1 Height (in): 66 Pulse (bpm): 71 Source: Stated Respiratory Rate (breaths/min): 18 Weight (lbs): 141 Blood Pressure (mmHg): 139/82 Source: Stated Reference Range: 80 - 120 mg / dl Body Mass Index (BMI): 22.8 Electronic Signature(s) Signed: 12/22/2021 4:40:57 PM By: Carlene Coria RN Entered By: Carlene Coria on 12/21/2021 09:00:23

## 2021-12-22 NOTE — Progress Notes (Signed)
Gregory, [name] ([id number]) [id number] Page 1 of 5 Visit Report for 12/21/2021 Abuse Risk Screen Details Patient Name: Date of Service: [name], [name] 12/21/2021 8:45 A M Medical Record Number: [id number] Patient Account Number: [id number] Date of Birth/Sex: Treating RN: 1960-10-31 (61 y.o. [name]) Carlene Coria Primary Care Michelangelo Rindfleisch: Kerri Perches Other Clinician: Referring Chandra Asher: Treating Ollie Delano/Extender: Urban Gibson in Treatment: 0 Abuse Risk Screen Items Answer ABUSE RISK SCREEN: Has anyone close to you tried to hurt or harm you recentlyo No Do you feel uncomfortable with anyone in your familyo No Has anyone forced you do things that you didnt want to doo No Electronic Signature(s) Signed: 12/22/2021 4:40:57 PM By: Carlene Coria RN Entered By: Carlene Coria on 12/21/2021 09:03:30 -------------------------------------------------------------------------------- Activities of Daily Living Details Patient Name: Date of Service: Gregory, [name] 12/21/2021 8:45 A M Medical Record Number: [id number] Patient Account Number: [id number] Date of Birth/Sex: Treating RN: Jan 23, 1961 (61 y.o. Oval Linsey Primary Care Lyah Millirons: Kerri Perches Other Clinician: Referring Camaya Gannett: Treating Daran Favaro/Extender: Urban Gibson in Treatment: 0 Activities of Daily Living Items Answer Activities of Daily Living (Please select one for each item) Drive Automobile Not Able T Medications ake Need Assistance Use T elephone Completely Able Care for Appearance Need Assistance Use T oilet Need Assistance Bath / Shower Need Assistance Dress Self Need Assistance Feed Self Completely Able Walk Need Assistance Get In / Out Bed Need Assistance Housework [place], [place] ([id number]) [id number].pdf Page 2 of 5 Prepare Meals Not Able Handle Money Need Assistance Shop for Self  Completely Able Electronic Signature(s) Signed: 12/22/2021 4:40:57 PM By: Carlene Coria RN Entered By: Carlene Coria on 12/21/2021 09:04:16 -------------------------------------------------------------------------------- Education Screening Details Patient Name: Date of Service: Gregory, [name] 12/21/2021 8:45 A M Medical Record Number: [id number] Patient Account Number: [id number] Date of Birth/Sex: Treating RN: 1961-01-22 (61 y.o. Oval Linsey Primary Care Dat Derksen: Kerri Perches Other Clinician: Referring Casmira Cramer: Treating Humphrey Guerreiro/Extender: Urban Gibson in Treatment: 0 Primary Learner Assessed: Patient Learning Preferences/Education Level/Primary Language Learning Preference: Explanation Highest Education Level: High School Preferred Language: English Cognitive Barrier Language Barrier: No Translator Needed: No Memory Deficit: No Emotional Barrier: No Cultural/Religious Beliefs Affecting Medical Care: No Physical Barrier Impaired Vision: No Impaired Hearing: No Decreased Hand dexterity: No Knowledge/Comprehension Knowledge Level: Medium Comprehension Level: Medium Ability to understand written instructions: Medium Ability to understand verbal instructions: Medium Motivation Anxiety Level: Anxious Cooperation: Cooperative Education Importance: Acknowledges Need Interest in Health Problems: Asks Questions Perception: Coherent Willingness to Engage in Self-Management High Activities: Readiness to Engage in Self-Management High Activities: Electronic Signature(s) Signed: 12/22/2021 4:40:57 PM By: Carlene Coria RN Entered By: Carlene Coria on 12/21/2021 09:04:45 [place], Saralyn Pilar ([id number]) [id number] Page 3 of 5 -------------------------------------------------------------------------------- Fall Risk Assessment Details Patient Name: Date of Service: Gregory, [name] 12/21/2021 8:45 A M Medical Record  Number: [id number] Patient Account Number: [id number] Date of Birth/Sex: Treating RN: 1960/12/19 (61 y.o. [name]) Carlene Coria Primary Care Marlow Berenguer: Kerri Perches Other Clinician: Referring Mannie Wineland: Treating Melizza Kanode/Extender: Urban Gibson in Treatment: 0 Fall Risk Assessment Items Have you had 2 or more falls in the last 12 monthso 0 No Have you had any fall that resulted in injury in the last 12 monthso 0 No FALLS RISK SCREEN History of falling - immediate or within 3 months 0 No Secondary diagnosis (Do you have 2 or more medical diagnoseso) 15 Yes Ambulatory aid None/bed rest/wheelchair/nurse 0 No Crutches/cane/walker 15 Yes Furniture 0 No  Intravenous therapy Access/Saline/Heparin Lock 0 No Gait/Transferring Normal/ bed rest/ wheelchair 0 No Weak (short steps with or without shuffle, stooped but able to lift head while walking, may seek 10 Yes support from furniture) Impaired (short steps with shuffle, may have difficulty arising from chair, head down, impaired 0 No balance) Mental Status Oriented to own ability 0 Yes Electronic Signature(s) Signed: 12/22/2021 4:40:57 PM By: Carlene Coria RN Entered By: Carlene Coria on 12/21/2021 09:05:56 -------------------------------------------------------------------------------- Foot Assessment Details Patient Name: Date of Service: [place], [name] 12/21/2021 8:45 A M Medical Record Number: [id number] Patient Account Number: [id number] Date of Birth/Sex: Treating RN: January 19, 1961 (61 y.o. Oval Linsey Primary Care Koleman Marling: Kerri Perches Other Clinician: Referring Sible Straley: Treating Candence Sease/Extender: Urban Gibson in Treatment: 0 Foot Assessment Items Site Locations [place], [place] ([id number]) [id number] Page 4 of 5 + = Sensation present, - = Sensation absent, C = Callus, U = Ulcer R = Redness, W = Warmth, M = Maceration, PU = Pre-ulcerative lesion F =  Fissure, S = Swelling, D = Dryness Assessment Right: Left: Other Deformity: No No Prior Foot Ulcer: No Yes Prior Amputation: No Yes Charcot Joint: No No Ambulatory Status: Ambulatory With Help Assistance Device: Wheelchair Gait: Administrator, arts) Signed: 12/22/2021 4:40:57 PM By: Carlene Coria RN Entered By: Carlene Coria on 12/21/2021 09:14:04 -------------------------------------------------------------------------------- Nutrition Risk Screening Details Patient Name: Date of Service: KONECNY, [name] 12/21/2021 8:45 A M Medical Record Number: [id number] Patient Account Number: [id number] Date of Birth/Sex: Treating RN: 01-Aug-1960 (61 y.o. [name]) Carlene Coria Primary Care Rini Moffit: Kerri Perches Other Clinician: Referring Jaxzen Vanhorn: Treating Sarissa Dern/Extender: Urban Gibson in Treatment: 0 Height (in): 66 Weight (lbs): 141 Body Mass Index (BMI): 22.8 Nutrition Risk Screening Items Score Screening NUTRITION RISK SCREEN: I have an illness or condition that made me change the kind and/or amount of food I eat 0 No I eat fewer than two meals per day 0 No I eat few fruits and vegetables, or milk products 0 No I have three or more drinks of beer, liquor or wine almost every day 0 No I have tooth or mouth problems that make it hard for me to eat 0 No [place], Saralyn Pilar ([id number]) [id number] Page 5 of 5 I don't always have enough money to buy the food I need 0 No I eat alone most of the time 0 No I take three or more different prescribed or over-the-counter drugs a day 0 No Without wanting to, I have lost or gained 10 pounds in the last six months 2 Yes I am not always physically able to shop, cook and/or feed myself 2 Yes Nutrition Protocols Good Risk Protocol Provide education on elevated blood Moderate Risk Protocol 0 sugars and impact on wound healing, as applicable High Risk Proctocol Risk Level: Moderate  Risk Score: 4 Electronic Signature(s) Signed: 12/22/2021 4:40:57 PM By: Carlene Coria RN Entered By: Carlene Coria on 12/21/2021 09:06:36

## 2021-12-22 NOTE — Progress Notes (Signed)
[name], [name] ([id number]) [id number] Page 1 of 9 Visit Report for 12/21/2021 Chief Complaint Document Details Patient Name: Date of Service: [name], [name] 12/21/2021 8:45 A M Medical Record Number: [id number] Patient Account Number: [id number] Date of Birth/Sex: Treating RN: [birth year] (61 y.o. [name]) Carlene Coria Primary Care Provider: Kerri Perches Other Clinician: Referring Provider: Treating Provider/Extender: Urban Gibson in Treatment: 0 Information Obtained from: Patient Chief Complaint 12/21/2021; right third toe wound Electronic Signature(s) Signed: 12/21/2021 10:21:51 AM By: Kalman Shan DO Entered By: Kalman Shan on 12/21/2021 10:12:03 -------------------------------------------------------------------------------- HPI Details Patient Name: Date of Service: [place], [place] TRICK 12/21/2021 8:45 A M Medical Record Number: [id number] Patient Account Number: [id number] Date of Birth/Sex: Treating RN: Feb 16, 1960 (61 y.o. [name] Primary Care Provider: Kerri Perches Other Clinician: Referring Provider: Treating Provider/Extender: Urban Gibson in Treatment: 0 History of Present Illness Location: open wound at the right first metatarsal region Quality: Patient reports No Pain. Severity: Patient states wound are getting worse. Duration: Patient has had the wound for < 3 weeks prior to presenting for treatment Context: The wound appeared gradually over time Modifying Factors: Other treatment(s) tried include:has had angioplasty and amputation of his right big toe ssociated Signs and Symptoms: Patient reports having difficulty standing for long periods. 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 He is not sure how this started. He States he has been using an ointment to the area but is unclear the name He follows with vein and vascular and last saw them in June 2023. He was supposed to have follow-up with ABIs in September. He currently denies signs of infection. Electronic Signature(s) Signed: 12/21/2021 10:21:51 AM By: Rolly Salter, Saralyn Pilar ([id number]) [id number] Page 2 of 9 Signed: 12/21/2021 10:21:51 AM By: Kalman Shan DO Entered By: Kalman Shan on 12/21/2021 10:14:54 -------------------------------------------------------------------------------- Physical Exam Details Patient Name: Date of Service: [place], [place] TRICK 12/21/2021 8:45 A M Medical Record Number: [id number] Patient Account Number: [id number] Date of Birth/Sex: Treating RN: 1960/08/09 (61 y.o. [name] Primary Care Provider: Kerri Perches Other Clinician: Referring Provider: Treating Provider/Extender: Urban Gibson in Treatment: 0 Constitutional . Psychiatric . Notes Necrotic right toe. No increased warmth, erythema or purulent drainage. Unable to palpate pedal pulses. Foot is warm. Electronic Signature(s) Signed: 12/21/2021 10:21:51 AM By: Kalman Shan DO Entered By: Kalman Shan on 12/21/2021 10:15:27 -------------------------------------------------------------------------------- Physician Orders Details Patient Name: Date of Service: [place], [place] TRICK 12/21/2021 8:45 A M Medical Record Number: [id number] Patient Account Number: [id number] Date of Birth/Sex: Treating RN: 06-01-1960 (61 y.o. [name] Primary Care Provider: Kerri Perches Other Clinician: Referring Provider: Treating Provider/Extender: Urban Gibson in Treatment: 0 Verbal / Phone Orders: No Diagnosis Coding ICD-10 Coding Code Description E11.621 Type 2 diabetes  mellitus with foot ulcer Follow-up Appointments Return Appointment in 1 week. Bathing/ Shower/ Hygiene No tub bath. [name], [name] ([id number]) [id number] Page 3 of 9 Edema Control - Lymphedema / Segmental Compressive Device / Other Elevate, Exercise Daily and A void Standing for Long Periods of Time. Elevate legs to the level of the heart and pump ankles as often as possible Elevate leg(s) parallel to the floor when sitting. Wound Treatment Wound #2 - T Third oe Wound Laterality: Right, Circumferential  Topical: Betadine 1 x Per Day/30 Days Discharge Instructions: Apply betadine as directed. Electronic Signature(s) Signed: 12/21/2021 10:21:51 AM By: Kalman Shan DO Entered By: Kalman Shan on 12/21/2021 10:18:27 -------------------------------------------------------------------------------- Problem List Details Patient Name: Date of Service: [place], [place] TRICK 12/21/2021 8:45 A M Medical Record Number: [id number] Patient Account Number: [id number] Date of Birth/Sex: Treating RN: 1960/11/13 (61 y.o. 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 12/21/2021 No Yes foot E11.621 Type 2 diabetes mellitus with foot ulcer 12/21/2021 No Yes S91.104A Unspecified open wound of right lesser toe(s) without damage to nail, initial 12/21/2021 No Yes encounter Inactive Problems Resolved Problems Electronic Signature(s) Signed: 12/21/2021 10:21:51 AM By: Kalman Shan DO Entered By: Kalman Shan on 12/21/2021 10:11:50 [name] ([id number]) [id number] Page 4 of  9 -------------------------------------------------------------------------------- Progress Note Details Patient Name: Date of Service: [name], [name] 12/21/2021 8:45 A M Medical Record Number: [id number] Patient Account Number: [id number] Date of Birth/Sex: Treating RN: [birth year] (61 y.o. [name]) Carlene Coria Primary Care Provider: Kerri Perches Other Clinician: Referring Provider: Treating Provider/Extender: Urban Gibson in Treatment: 0 Subjective Chief Complaint Information obtained from Patient 12/21/2021; right third toe wound History of Present Illness (HPI) The following HPI elements were documented for the patient's wound: Location: open wound at the right first metatarsal region Quality: Patient reports No Pain. Severity: Patient states wound are getting worse. Duration: Patient has had the wound for < 3 weeks prior to presenting for treatment Context: The wound appeared gradually over time Modifying Factors: Other treatment(s) tried include:has had angioplasty and amputation of his right big toe Associated Signs and Symptoms: Patient reports having difficulty standing for long periods. 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 He is not sure how this started. He States he has been using an ointment to the area but is unclear the name He follows with vein and vascular and last saw them in June 2023. He was supposed to have follow-up with ABIs in September. He currently denies signs of infection. Patient History Information obtained from Patient. Allergies No Known Drug Allergies Family History Diabetes - Mother, No family history of Cancer, Heart Disease, Hypertension, Kidney Disease, Lung Disease, Seizures, Stroke, Thyroid  Problems. Social History Current every day smoker, Marital Status - Single, Alcohol Use - Never, Drug Use - No History, Caffeine Use - Daily. Medical History Eyes Denies history of Cataracts, Glaucoma, Optic Neuritis Ear/Nose/Mouth/Throat Denies history of Chronic sinus problems/congestion, Middle ear problems Hematologic/Lymphatic Denies history of Anemia, Hemophilia, Human Immunodeficiency Virus, Lymphedema, Sickle Cell Disease Respiratory Denies history of Aspiration, Asthma, Chronic Obstructive Pulmonary Disease (COPD), Pneumothorax, Sleep Apnea, Tuberculosis Cardiovascular Patient has history of Congestive Heart Failure, Hypertension, Peripheral Arterial Disease, Peripheral Venous Disease - unspecified Denies history of Angina, Arrhythmia, Coronary Artery Disease, Deep Vein Thrombosis, Hypotension, Myocardial Infarction, Phlebitis, Vasculitis Gastrointestinal Denies history of Cirrhosis , Colitis, Crohnoos, Hepatitis A, Hepatitis B, Hepatitis C Endocrine Patient has history of Type II Diabetes - 2015 Denies history of Type I Diabetes Genitourinary Patient has history of End Stage Renal Disease Immunological Denies history of Lupus Erythematosus, Raynaudoos,  Scleroderma Integumentary (Skin) Denies history of History of Burn, History of pressure wounds Musculoskeletal Patient has history of Osteoarthritis - shoulder Denies history of Gout, Rheumatoid Arthritis, Osteomyelitis Neurologic Patient has history of Neuropathy, Seizure Disorder PLEASANT, BENSINGER ([id number]) [id number] Page 5 of 9 Denies history of Dementia, Quadriplegia, Paraplegia Oncologic Denies history of Received Chemotherapy, Received Radiation Psychiatric Denies history of Anorexia/bulimia, Confinement Anxiety Hospitalization/Surgery History - Amputation of great toe. Medical A Surgical History Notes nd Constitutional Symptoms (General Health) Type II Diabetes Review of Systems  (ROS) Constitutional Symptoms (General Health) Denies complaints or symptoms of Fatigue, Fever, Chills, Marked Weight Change. Eyes Denies complaints or symptoms of Dry Eyes, Vision Changes, Glasses / Contacts. Ear/Nose/Mouth/Throat Denies complaints or symptoms of Difficult clearing ears, Sinusitis. Hematologic/Lymphatic Denies complaints or symptoms of Bleeding / Clotting Disorders, Human Immunodeficiency Virus. Respiratory Denies complaints or symptoms of Chronic or frequent coughs, Shortness of Breath. Cardiovascular Denies complaints or symptoms of Chest pain, LE edema. Gastrointestinal Denies complaints or symptoms of Frequent diarrhea, Nausea, Vomiting. Endocrine Denies complaints or symptoms of Hepatitis, Thyroid disease, Polydypsia (Excessive Thirst). Genitourinary Denies complaints or symptoms of Kidney failure/ Dialysis, Incontinence/dribbling. Immunological Denies complaints or symptoms of Hives, Itching. Integumentary (Skin) Complains or has symptoms of Wounds, Swelling. Musculoskeletal Denies complaints or symptoms of Muscle Pain, Muscle Weakness. Neurologic Denies complaints or symptoms of Numbness/parasthesias, Focal/Weakness. Psychiatric Denies complaints or symptoms of Anxiety, Claustrophobia. Objective Constitutional Vitals Time Taken: 8:50 AM, Height: 66 in, Source: Stated, Weight: 141 lbs, Source: Stated, BMI: 22.8, Temperature: 98.1 F, Pulse: 71 bpm, Respiratory Rate: 18 breaths/min, Blood Pressure: 139/82 mmHg. General Notes: Necrotic right toe. No increased warmth, erythema or purulent drainage. Unable to palpate pedal pulses. Foot is warm. Integumentary (Hair, Skin) Wound #2 status is Open. Original cause of wound was Gradually Appeared. The date acquired was: 12/05/2021. The wound is located on the Right,Circumferential T Third. The wound measures 3cm length x 6cm width x 0.1cm depth; 14.137cm^2 area and 1.414cm^3 volume. There is no tunneling  or oe undermining noted. There is a none present amount of drainage noted. There is no granulation within the wound bed. There is a large (67-100%) amount of necrotic tissue within the wound bed including Eschar and Adherent Slough. Assessment Active Problems ICD-10 Atherosclerosis of native arteries of right leg with ulceration of other part of foot Type 2 diabetes mellitus with foot ulcer Unspecified open wound of right lesser toe(s) without damage to nail, initial encounter Patient presents with a 1 week history of necrotic right third toe in the setting of peripheral arterial disease and type 2 diabetes. No signs of infection on exam. I recommended he follow-up with vein and vascular for reassessment of blood flow and possible amputation. We were able to schedule him an appointment today with [facility]. Information on appointment time and location was given to the patient. He expressed understanding and reports he will go to the appointment. He may follow-up here as needed. I also recommended that he if he had symptoms of increased pain, erythema, warmth or purulent drainage he go to the ED. For now he can do Betadine dressings. [name], [name] ([id number]) [id number] Page 6 of 9 Plan Follow-up Appointments: Return Appointment in 1 week. Bathing/ Shower/ Hygiene: No tub bath. Edema Control - Lymphedema / Segmental Compressive Device / Other: Elevate, Exercise Daily and Avoid Standing for Long Periods of Time. Elevate legs to the level of the heart and pump ankles as often as possible Elevate leg(s) parallel to the floor when sitting. WOUND #2: -  T Third Wound Laterality: Right, Circumferential oe Topical: Betadine 1 x Per Day/30 Days Discharge Instructions: Apply betadine as directed. 1. Betadine 2. Follow-up with vein and vascular today at 11 AM 3. Follow-up in our wound care center as needed Electronic Signature(s) Signed: 12/21/2021 10:21:51 AM By:  Kalman Shan DO Entered By: Kalman Shan on 12/21/2021 10:17:48 -------------------------------------------------------------------------------- ROS/PFSH Details Patient Name: Date of Service: [place], [place] TRICK 12/21/2021 8:45 A M Medical Record Number: [id number] Patient Account Number: [id number] Date of Birth/Sex: Treating RN: 03-Dec-1960 (61 y.o. [name] Primary Care Provider: Kerri Perches Other Clinician: Referring Provider: Treating Provider/Extender: Urban Gibson in Treatment: 0 Information Obtained From Patient Constitutional Symptoms (General Health) Complaints and Symptoms: Negative for: Fatigue; Fever; Chills; Marked Weight Change Medical History: Past Medical History Notes: Type II Diabetes Eyes Complaints and Symptoms: Negative for: Dry Eyes; Vision Changes; Glasses / Contacts Medical History: Negative for: Cataracts; Glaucoma; Optic Neuritis Ear/Nose/Mouth/Throat Complaints and Symptoms: Negative for: Difficult clearing ears; Sinusitis Medical History: Negative for: Chronic sinus problems/congestion; Middle ear problems Hematologic/Lymphatic Complaints and Symptoms: Negative for: Bleeding / Clotting Disorders; Human Immunodeficiency Virus [name], [name] ([id number]) [id number] Page 7 of 9 Medical History: Negative for: Anemia; Hemophilia; Human Immunodeficiency Virus; Lymphedema; Sickle Cell Disease Respiratory Complaints and Symptoms: Negative for: Chronic or frequent coughs; Shortness of Breath Medical History: Negative for: Aspiration; Asthma; Chronic Obstructive Pulmonary Disease (COPD); Pneumothorax; Sleep Apnea; Tuberculosis Cardiovascular Complaints and Symptoms: Negative for: Chest pain; LE edema Medical History: Positive for: Congestive Heart Failure; Hypertension; Peripheral Arterial Disease; Peripheral Venous Disease - unspecified Negative for: Angina; Arrhythmia; Coronary Artery  Disease; Deep Vein Thrombosis; Hypotension; Myocardial Infarction; Phlebitis; Vasculitis Gastrointestinal Complaints and Symptoms: Negative for: Frequent diarrhea; Nausea; Vomiting Medical History: Negative for: Cirrhosis ; Colitis; Crohns; Hepatitis A; Hepatitis B; Hepatitis C Endocrine Complaints and Symptoms: Negative for: Hepatitis; Thyroid disease; Polydypsia (Excessive Thirst) Medical History: Positive for: Type II Diabetes - 2015 Negative for: Type I Diabetes Time with diabetes: 10 Treated with: Oral agents Blood sugar tested every day: No Genitourinary Complaints and Symptoms: Negative for: Kidney failure/ Dialysis; Incontinence/dribbling Medical History: Positive for: End Stage Renal Disease Immunological Complaints and Symptoms: Negative for: Hives; Itching Medical History: Negative for: Lupus Erythematosus; Raynauds; Scleroderma Integumentary (Skin) Complaints and Symptoms: Positive for: Wounds; Swelling Medical History: Negative for: History of Burn; History of pressure wounds Musculoskeletal Complaints and Symptoms: Negative for: Muscle Pain; Muscle Weakness Medical History: Positive for: Osteoarthritis - shoulder Negative for: Gout; Rheumatoid Arthritis; Osteomyelitis Neurologic Complaints and Symptoms: Negative for: Numbness/parasthesias; Focal/Weakness Medical History: Positive for: Neuropathy; Seizure Disorder Negative for: Dementia; Quadriplegia; Paraplegia [name], [name] ([id number]) [id number] Page 8 of 9 Psychiatric Complaints and Symptoms: Negative for: Anxiety; Claustrophobia Medical History: Negative for: Anorexia/bulimia; Confinement Anxiety Oncologic Medical History: Negative for: Received Chemotherapy; Received Radiation Immunizations Pneumococcal Vaccine: Received Pneumococcal Vaccination: Yes Received Pneumococcal Vaccination On or After 60th Birthday: No Implantable Devices None Hospitalization /  Surgery History Type of Hospitalization/Surgery Amputation of great toe Family and Social History Cancer: No; Diabetes: Yes - Mother; Heart Disease: No; Hypertension: No; Kidney Disease: No; Lung Disease: No; Seizures: No; Stroke: No; Thyroid Problems: No; Current every day smoker; Marital Status - Single; Alcohol Use: Never; Drug Use: No History; Caffeine Use: Daily Electronic Signature(s) Signed: 12/21/2021 10:21:51 AM By: Kalman Shan DO Signed: 12/22/2021 4:40:57 PM By: Carlene Coria RN Entered By: Carlene Coria on 12/21/2021 09:03:19 -------------------------------------------------------------------------------- SuperBill Details Patient Name: Date of Service: [place], [place] 12/21/2021 Medical Record Number: [id number] Patient Account Number: [id number]  Date of Birth/Sex: Treating RN: 08-07-1960 (61 y.o. [name]) Carlene Coria Primary Care Provider: Kerri Perches Other Clinician: Referring Provider: Treating Provider/Extender: Urban Gibson in Treatment: 0 Diagnosis Coding ICD-10 Codes Code Description [phone] Atherosclerosis of native arteries of right leg with ulceration of other part of foot E11.621 Type 2 diabetes mellitus with foot ulcer S91.104A Unspecified open wound of right lesser toe(s) without damage to nail, initial encounter Facility Procedures : CPT4 Code: [id number] Description: [place] VISIT-LEV 3 EST PT Modifier: Quantity: 1 Physician Procedures : CPT4 Code Description Modifier [id number] - [facility] PHYS LEVEL 4 - NEW PT [name], [name] ([id number]) [id number] Quantity: 1 7.pdf Page 9 of 9 : ICD-10 Diagnosis Description I70.235 Atherosclerosis of native arteries of right leg with ulceration of other part of foot E11.621 Type 2 diabetes mellitus with foot ulcer S91.104A Unspecified open wound of right lesser toe(s) without damage to nail,  initial encounter Quantity: Electronic Signature(s) Signed:  12/21/2021 10:45:17 AM By: Carlene Coria RN Signed: 12/21/2021 12:17:49 PM By: Kalman Shan DO Previous Signature: 12/21/2021 10:21:51 AM Version By: Kalman Shan DO Entered By: Carlene Coria on 12/21/2021 10:45:16

## 2021-12-23 ENCOUNTER — Ambulatory Visit (HOSPITAL_BASED_OUTPATIENT_CLINIC_OR_DEPARTMENT_OTHER)
Admission: RE | Admit: 2021-12-23 | Discharge: 2021-12-23 | Disposition: A | Discharge status: Home / Self Care | Payer: Medicare Other | Source: Home / Self Care | Attending: Vascular Surgery | Admitting: Vascular Surgery

## 2021-12-23 ENCOUNTER — Encounter
Admission: RE | Disposition: A | Discharge status: Home / Self Care | Payer: Self-pay | Source: Home / Self Care | Attending: Vascular Surgery

## 2021-12-23 DIAGNOSIS — T82898A Other specified complication of vascular prosthetic devices, implants and grafts, initial encounter: Secondary | ICD-10-CM | POA: Diagnosis not present

## 2021-12-23 DIAGNOSIS — Z7984 Long term (current) use of oral hypoglycemic drugs: Secondary | ICD-10-CM | POA: Insufficient documentation

## 2021-12-23 DIAGNOSIS — F17210 Nicotine dependence, cigarettes, uncomplicated: Secondary | ICD-10-CM | POA: Insufficient documentation

## 2021-12-23 DIAGNOSIS — I10 Essential (primary) hypertension: Secondary | ICD-10-CM | POA: Insufficient documentation

## 2021-12-23 DIAGNOSIS — I70221 Atherosclerosis of native arteries of extremities with rest pain, right leg: Secondary | ICD-10-CM | POA: Insufficient documentation

## 2021-12-23 DIAGNOSIS — T82856A Stenosis of peripheral vascular stent, initial encounter: Secondary | ICD-10-CM

## 2021-12-23 DIAGNOSIS — E119 Type 2 diabetes mellitus without complications: Secondary | ICD-10-CM | POA: Insufficient documentation

## 2021-12-23 DIAGNOSIS — M79604 Pain in right leg: Secondary | ICD-10-CM | POA: Diagnosis not present

## 2021-12-23 DIAGNOSIS — I998 Other disorder of circulatory system: Secondary | ICD-10-CM

## 2021-12-23 DIAGNOSIS — I743 Embolism and thrombosis of arteries of the lower extremities: Secondary | ICD-10-CM

## 2021-12-23 HISTORY — PX: LOWER EXTREMITY ANGIOGRAPHY: CATH118251

## 2021-12-23 LAB — BUN: BUN: 15 mg/dL (ref 6–20)

## 2021-12-23 LAB — CREATININE, SERUM
Creatinine, Ser: 0.61 mg/dL (ref 0.61–1.24)
GFR, Estimated: >60 mL/min (ref >60)

## 2021-12-23 LAB — GLUCOSE, CAPILLARY
Glucose-Capillary: 84 mg/dL (ref 70–99)
Glucose-Capillary: 115 mg/dL — ABNORMAL HIGH (ref 70–99)

## 2021-12-23 SURGERY — LOWER EXTREMITY ANGIOGRAPHY
Anesthesia: Moderate Sedation | Site: Leg Lower | Laterality: Right

## 2021-12-23 MED ORDER — HEPARIN SODIUM (PORCINE) 1000 UNIT/ML IJ SOLN
INTRAMUSCULAR | Status: DC | PRN
  Administered 2021-12-23: 5000 [IU] via INTRAVENOUS

## 2021-12-23 MED ORDER — SODIUM CHLORIDE 0.9 % IV SOLN: INTRAVENOUS | Status: DC

## 2021-12-23 MED ORDER — CEFAZOLIN SODIUM-DEXTROSE 2-4 GM/100ML-% IV SOLN: 2.0000 g | INTRAVENOUS | Status: AC

## 2021-12-23 MED ORDER — ASPIRIN 81 MG PO TBEC: 81.0000 mg | DELAYED_RELEASE_TABLET | Freq: Every day | ORAL | 0 refills | Status: DC

## 2021-12-23 MED ORDER — NITROGLYCERIN 1 MG/10 ML FOR IR/CATH LAB
INTRA_ARTERIAL | Status: AC
  Filled 2021-12-23: qty 10

## 2021-12-23 MED ORDER — MIDAZOLAM HCL 2 MG/ML PO SYRP: 8.0000 mg | ORAL_SOLUTION | Freq: Once | ORAL | Status: DC | PRN

## 2021-12-23 MED ORDER — HYDROMORPHONE HCL 1 MG/ML IJ SOLN: 1.0000 mg | Freq: Once | INTRAMUSCULAR | Status: DC | PRN

## 2021-12-23 MED ORDER — IODIXANOL 320 MG/ML IV SOLN
INTRAVENOUS | Status: DC | PRN
  Administered 2021-12-23: 80 mL

## 2021-12-23 MED ORDER — LABETALOL HCL 5 MG/ML IV SOLN
INTRAVENOUS | Status: AC
  Filled 2021-12-23: qty 4

## 2021-12-23 MED ORDER — FAMOTIDINE 20 MG PO TABS: 40.0000 mg | ORAL_TABLET | Freq: Once | ORAL | Status: DC | PRN

## 2021-12-23 MED ORDER — METHYLPREDNISOLONE SODIUM SUCC 125 MG IJ SOLR
125.0000 mg | Freq: Once | INTRAMUSCULAR | Status: DC | PRN
Start: 2021-12-23 — End: 2021-12-25

## 2021-12-23 MED ORDER — FENTANYL CITRATE PF 50 MCG/ML IJ SOSY
PREFILLED_SYRINGE | INTRAMUSCULAR | Status: AC
  Filled 2021-12-23: qty 1

## 2021-12-23 MED ORDER — ONDANSETRON HCL 4 MG/2ML IJ SOLN: 4.0000 mg | Freq: Four times a day (QID) | INTRAMUSCULAR | Status: DC | PRN

## 2021-12-23 MED ORDER — FENTANYL CITRATE (PF) 100 MCG/2ML IJ SOLN
INTRAMUSCULAR | Status: DC | PRN
  Administered 2021-12-23 (×2): 12.5 ug via INTRAVENOUS
  Administered 2021-12-23: 25 ug via INTRAVENOUS
  Administered 2021-12-23: 50 ug via INTRAVENOUS
  Administered 2021-12-23: 25 ug via INTRAVENOUS

## 2021-12-23 MED ORDER — MIDAZOLAM HCL 2 MG/2ML IJ SOLN
INTRAMUSCULAR | Status: DC | PRN
  Administered 2021-12-23 (×4): 1 mg via INTRAVENOUS

## 2021-12-23 MED ORDER — NITROGLYCERIN 1 MG/10 ML FOR IR/CATH LAB
INTRA_ARTERIAL | Status: DC | PRN
  Administered 2021-12-23: 400 ug via INTRA_ARTERIAL

## 2021-12-23 MED ORDER — HYDRALAZINE HCL 20 MG/ML IJ SOLN
INTRAMUSCULAR | Status: DC | PRN
  Administered 2021-12-23: 10 mg via INTRAVENOUS

## 2021-12-23 MED ORDER — DIPHENHYDRAMINE HCL 50 MG/ML IJ SOLN
50.0000 mg | Freq: Once | INTRAMUSCULAR | Status: DC | PRN
Start: 2021-12-23 — End: 2021-12-25

## 2021-12-23 MED ORDER — MIDAZOLAM HCL 2 MG/2ML IJ SOLN
INTRAMUSCULAR | Status: AC
  Filled 2021-12-23: qty 2

## 2021-12-23 MED ORDER — APIXABAN 5 MG PO TABS: 5.0000 mg | ORAL_TABLET | Freq: Two times a day (BID) | ORAL | 3 refills | Status: DC

## 2021-12-23 MED ORDER — LABETALOL HCL 5 MG/ML IV SOLN
INTRAVENOUS | Status: DC | PRN
  Administered 2021-12-23: 20 mg via INTRAVENOUS

## 2021-12-23 MED ORDER — CEFAZOLIN SODIUM-DEXTROSE 2-4 GM/100ML-% IV SOLN
INTRAVENOUS | Status: AC
  Administered 2021-12-23: 2 g via INTRAVENOUS
  Filled 2021-12-23: qty 100

## 2021-12-23 MED ORDER — HEPARIN SODIUM (PORCINE) 1000 UNIT/ML IJ SOLN
INTRAMUSCULAR | Status: AC
  Filled 2021-12-23: qty 10

## 2021-12-23 MED ORDER — HYDRALAZINE HCL 20 MG/ML IJ SOLN
INTRAMUSCULAR | Status: AC
  Filled 2021-12-23: qty 1

## 2021-12-23 MED ORDER — FENTANYL CITRATE (PF) 100 MCG/2ML IJ SOLN
INTRAMUSCULAR | Status: AC
  Filled 2021-12-23: qty 2

## 2021-12-23 SURGICAL SUPPLY — 26 items
BALLN LUTONIX 018 5X300X130 (BALLOONS) ×1
BALLN LUTONIX 018 6X150X130 (BALLOONS) ×1
BALLN ULTRVRSE 018 2.5X150X150 (BALLOONS) ×1
BALLN ULTRVRSE 3X220X150 (BALLOONS) ×1
BALLOON LUTONIX 018 5X300X130 (BALLOONS) IMPLANT
BALLOON LUTONIX 018 6X150X130 (BALLOONS) IMPLANT
BALLOON ULTRVRSE 3X220X150 (BALLOONS) IMPLANT
BALLOON ULTRVS 018 2.5X150X150 (BALLOONS) IMPLANT
CATH ANGIO 5F PIGTAIL 100CM (CATHETERS) IMPLANT
CATH BEACON 5 .035 100 JB1 TIP (CATHETERS) IMPLANT
CATH ROTAREX 135 6FR (CATHETERS) IMPLANT
CATH SEEKER .035X135CM (CATHETERS) IMPLANT
CATH VERT 5FR 125CM (CATHETERS) IMPLANT
DEVICE STARCLOSE SE CLOSURE (Vascular Products) IMPLANT
DEVICE TORQUE (MISCELLANEOUS) IMPLANT
GLIDEWIRE ADV .035X260CM (WIRE) IMPLANT
GUIDEWIRE PFTE-COATED .018X300 (WIRE) IMPLANT
KIT ENCORE 26 ADVANTAGE (KITS) IMPLANT
PACK ANGIOGRAPHY (CUSTOM PROCEDURE TRAY) ×1 IMPLANT
SHEATH ANL2 6FRX45 HC (SHEATH) IMPLANT
SHEATH BRITE TIP 5FRX11 (SHEATH) IMPLANT
STENT VIABAHN 6X150X120 (Permanent Stent) IMPLANT
SYR MEDRAD MARK 7 150ML (SYRINGE) IMPLANT
TUBING CONTRAST HIGH PRESS 72 (TUBING) IMPLANT
WIRE G V18X300 ST (WIRE) IMPLANT
WIRE GUIDERIGHT .035X150 (WIRE) IMPLANT

## 2021-12-23 NOTE — OR Nursing (Signed)
Called [name] requesting an updated medication list via fax. However had to leave voice message.  Private nurse was also informed to hold his Metformin for two days post angiogram, (restart Monday)

## 2021-12-23 NOTE — Interval H&P Note (Signed)
History and Physical Interval Note:  12/23/2021 11:45 AM  [name]  has presented today for surgery, with the diagnosis of RLE Angio   BARD    Ischemic leg.  The various methods of treatment have been discussed with the patient and family. After consideration of risks, benefits and other options for treatment, the patient has consented to  Procedure(s): Lower Extremity Angiography (Right) as a surgical intervention.  The patient's history has been reviewed, patient examined, no change in status, stable for surgery.  I have reviewed the patient's chart and labs.  Questions were answered to the patient's satisfaction.     [name]

## 2021-12-23 NOTE — OR Nursing (Signed)
Private duty nurse called to verify medications. She [facility]) said he is not currently taking Eliquis or aspirin she said to get accurate medication list we would need to call [facility]. She thinks most of the medicaitons on the list sister provided have been changed. Sister said the private duty nurse fills his med box

## 2021-12-23 NOTE — Op Note (Signed)
[facility]  Percutaneous Study/Intervention Procedural Note   Date of Surgery: 12/23/2021  Surgeon(s):Braelen Sproule    Assistants:none  Pre-operative Diagnosis: PAD with rest pain right lower extremity  Post-operative diagnosis:  Same  Procedure(s) Performed:             1.  Ultrasound guidance for vascular access left femoral artery             2.  Catheter placement into right common femoral artery from left femoral approach             3.  Aortogram and selective right lower extremity angiogram             4.  Percutaneous transluminal angioplasty of right peroneal artery and tibioperoneal trunk with 2.5 mm diameter by 15 cm length angioplasty balloon             5.  Mechanical thrombectomy of the right SFA, popliteal artery, and proximal tibioperoneal trunk with the [place] Rex device  6.  Percutaneous transluminal angioplasty of the right SFA and popliteal arteries with a 5 mm diameter by 30 cm length Lutonix drug-coated angioplasty balloon  7.  Percutaneous transluminal angioplasty of the right posterior tibial artery and tibioperoneal trunk with 3 mm diameter by 22 cm length angioplasty balloon  8.  Stent placement of the right distal SFA and proximal popliteal artery with 6 mm diameter by 15 cm length Viabahn stent             9.  StarClose closure device left femoral artery  EBL: 100 cc  Contrast: 80 cc  Fluoro Time: 22.3 minutes  Moderate Conscious Sedation Time: approximately 85 minutes using 4 mg of Versed and 125 mcg of Fentanyl              Indications:  Patient is a 61 y.o.male with rest pain and severe PAD. The patient has noninvasive study showing occlusion of previous intervention. The patient is brought in for angiography for further evaluation and potential treatment.  Due to the limb threatening nature of the situation, angiogram was performed for attempted limb salvage. The patient is aware that if the procedure fails, amputation would be  expected.  The patient also understands that even with successful revascularization, amputation may still be required due to the severity of the situation.  Risks and benefits are discussed and informed consent is obtained.   Procedure:  The patient was identified and appropriate procedural time out was performed.  The patient was then placed supine on the table and prepped and draped in the usual sterile fashion. Moderate conscious sedation was administered during a face to face encounter with the patient throughout the procedure with my supervision of the RN administering medicines and monitoring the patient's vital signs, pulse oximetry, telemetry and mental status throughout from the start of the procedure until the patient was taken to the recovery room. Ultrasound was used to evaluate the left common femoral artery.  It was patent .  A digital ultrasound image was acquired.  A Seldinger needle was used to access the left common femoral artery under direct ultrasound guidance and a permanent image was performed.  A 0.035 J wire was advanced without resistance and a 5Fr sheath was placed.  Pigtail catheter was placed into the aorta and an AP aortogram was performed. This demonstrated normal renal arteries and normal aorta and iliac segments without significant stenosis. I then crossed the aortic bifurcation and advanced to the right femoral head.  Selective right lower extremity angiogram was then performed. This demonstrated mild to moderate disease in the common femoral artery and profunda femoris artery.  Superficial femoral artery was patent in the proximal and mid segment but had a high-grade stenosis and then an occlusion in the distal segment.  The previously placed popliteal stent was occluded..  There was occlusion of all 3 tibial vessels with reconstitution of the mid posterior tibial artery as the only runoff distally. It was felt that it was in the patient's best interest to proceed with  intervention after these images to avoid a second procedure and a larger amount of contrast and fluoroscopy based off of the findings from the initial angiogram. The patient was systemically heparinized and a 6 [place] Ansell sheath was then placed over the [employer] wire. I then used a Kumpe catheter and the advantage wire to navigate through the SFA and popliteal occlusion without difficulty but initially, I could only get into the peroneal artery distally.  I went ahead and got down into the peroneal artery which was occluded in the mid to distal segment but did have some collaterals and placed a V18 wire.  I ballooned the peroneal artery with a 2.5 mm diameter by 15 cm length angioplasty balloon up to the tibioperoneal trunk.  I perform mechanical thrombectomy with the [place] Rex device to debulk the chronic thrombus in the right SFA and popliteal arteries and down into the proximal tibioperoneal trunk.  2 passes were made.  Completion imaging now showed a flow channel but there was still very sluggish flow due to the poor runoff distally.  Tediously, I was eventually able to get down into the posterior tibial artery with a 0.018 advantage wire and a JB 1 catheter and then exchanged for a V18 wire.  I then performed balloon angioplasty of the posterior tibial artery and tibioperoneal trunk with a 3 mm diameter by 22 cm length angioplasty balloon.  The SFA and popliteal arteries were treated with a 5 mm diameter by 30 cm length Lutonix drug-coated angioplasty balloon.  Completion imaging showed about a 30% residual stenosis in the tibioperoneal trunk and posterior tibial artery, but there remained significant residual stenosis above the previously placed stent in the distal SFA and proximal popliteal artery.  I elected to place a 6 mm diameter by 15 cm length Viabahn stent and postdilated this with a 6 mm balloon with excellent angiographic completion result and less than 20% residual stenosis. I elected to  terminate the procedure. The sheath was removed and StarClose closure device was deployed in the left femoral artery with excellent hemostatic result. The patient was taken to the recovery room in stable condition having tolerated the procedure well.  Findings:               Aortogram:  This demonstrated normal renal arteries and normal aorta and iliac segments without significant stenosis.             Right Lower Extremity: This demonstrates mild to moderate disease in the common femoral artery and profunda femoris artery.  Superficial femoral artery was patent in the proximal and mid segment but had a high-grade stenosis and then an occlusion in the distal segment.  The previously placed popliteal stent was occluded..  There was occlusion of all 3 tibial vessels with reconstitution of the mid posterior tibial artery as the only runoff distally.   Disposition: Patient was taken to the recovery room in stable condition having tolerated the procedure well.  Complications: None  [name] 12/23/2021 2:38 PM   This note was created with Dragon Medical transcription system. Any errors in dictation are purely unintentional.

## 2021-12-23 NOTE — Discharge Instructions (Signed)
START TAKING ELIQUIS TWICE DAILY IN AM AND 81 MG ASPIRIN DAILY. DONT MISS ANY DOSES. CALL [facility] FOR FOLLOW UP APPOINTMENT ON MONDAY ( UNLESS THEY CALL YOU FIRST) [phone].

## 2021-12-26 ENCOUNTER — Inpatient Hospital Stay
Admission: EM | Admit: 2021-12-26 | Discharge: 2022-01-02 | DRG: 270 | Disposition: A | Discharge status: Skilled Nursing Facility | Payer: Medicare Other | Attending: Obstetrics and Gynecology | Admitting: Obstetrics and Gynecology

## 2021-12-26 ENCOUNTER — Other Ambulatory Visit: Payer: Self-pay

## 2021-12-26 ENCOUNTER — Emergency Department: Payer: Medicare Other

## 2021-12-26 ENCOUNTER — Telehealth (INDEPENDENT_AMBULATORY_CARE_PROVIDER_SITE_OTHER): Payer: Self-pay | Admitting: Vascular Surgery

## 2021-12-26 ENCOUNTER — Encounter: Payer: Self-pay | Admitting: Vascular Surgery

## 2021-12-26 DIAGNOSIS — I110 Hypertensive heart disease with heart failure: Secondary | ICD-10-CM | POA: Diagnosis present

## 2021-12-26 DIAGNOSIS — Z20822 Contact with and (suspected) exposure to covid-19: Secondary | ICD-10-CM | POA: Diagnosis present

## 2021-12-26 DIAGNOSIS — F17210 Nicotine dependence, cigarettes, uncomplicated: Secondary | ICD-10-CM | POA: Diagnosis present

## 2021-12-26 DIAGNOSIS — I70221 Atherosclerosis of native arteries of extremities with rest pain, right leg: Secondary | ICD-10-CM | POA: Diagnosis not present

## 2021-12-26 DIAGNOSIS — E785 Hyperlipidemia, unspecified: Secondary | ICD-10-CM | POA: Diagnosis present

## 2021-12-26 DIAGNOSIS — T82898A Other specified complication of vascular prosthetic devices, implants and grafts, initial encounter: Principal | ICD-10-CM | POA: Diagnosis present

## 2021-12-26 DIAGNOSIS — L97519 Non-pressure chronic ulcer of other part of right foot with unspecified severity: Secondary | ICD-10-CM | POA: Diagnosis present

## 2021-12-26 DIAGNOSIS — I5032 Chronic diastolic (congestive) heart failure: Secondary | ICD-10-CM | POA: Diagnosis present

## 2021-12-26 DIAGNOSIS — E876 Hypokalemia: Secondary | ICD-10-CM | POA: Diagnosis present

## 2021-12-26 DIAGNOSIS — Y828 Other medical devices associated with adverse incidents: Secondary | ICD-10-CM | POA: Diagnosis present

## 2021-12-26 DIAGNOSIS — R64 Cachexia: Secondary | ICD-10-CM | POA: Diagnosis present

## 2021-12-26 DIAGNOSIS — T796XXA Traumatic ischemia of muscle, initial encounter: Secondary | ICD-10-CM

## 2021-12-26 DIAGNOSIS — T82868A Thrombosis of vascular prosthetic devices, implants and grafts, initial encounter: Secondary | ICD-10-CM | POA: Diagnosis not present

## 2021-12-26 DIAGNOSIS — Z89611 Acquired absence of right leg above knee: Secondary | ICD-10-CM

## 2021-12-26 DIAGNOSIS — Z8249 Family history of ischemic heart disease and other diseases of the circulatory system: Secondary | ICD-10-CM | POA: Diagnosis not present

## 2021-12-26 DIAGNOSIS — M79604 Pain in right leg: Secondary | ICD-10-CM | POA: Diagnosis present

## 2021-12-26 DIAGNOSIS — D62 Acute posthemorrhagic anemia: Secondary | ICD-10-CM | POA: Diagnosis not present

## 2021-12-26 DIAGNOSIS — I70203 Unspecified atherosclerosis of native arteries of extremities, bilateral legs: Secondary | ICD-10-CM | POA: Diagnosis not present

## 2021-12-26 DIAGNOSIS — E1152 Type 2 diabetes mellitus with diabetic peripheral angiopathy with gangrene: Secondary | ICD-10-CM | POA: Diagnosis present

## 2021-12-26 DIAGNOSIS — N400 Enlarged prostate without lower urinary tract symptoms: Secondary | ICD-10-CM | POA: Diagnosis not present

## 2021-12-26 DIAGNOSIS — E11621 Type 2 diabetes mellitus with foot ulcer: Secondary | ICD-10-CM | POA: Diagnosis present

## 2021-12-26 DIAGNOSIS — Z833 Family history of diabetes mellitus: Secondary | ICD-10-CM

## 2021-12-26 DIAGNOSIS — J189 Pneumonia, unspecified organism: Secondary | ICD-10-CM | POA: Diagnosis not present

## 2021-12-26 DIAGNOSIS — Z7901 Long term (current) use of anticoagulants: Secondary | ICD-10-CM

## 2021-12-26 DIAGNOSIS — I743 Embolism and thrombosis of arteries of the lower extremities: Secondary | ICD-10-CM | POA: Diagnosis not present

## 2021-12-26 DIAGNOSIS — I69351 Hemiplegia and hemiparesis following cerebral infarction affecting right dominant side: Secondary | ICD-10-CM | POA: Diagnosis not present

## 2021-12-26 DIAGNOSIS — D638 Anemia in other chronic diseases classified elsewhere: Secondary | ICD-10-CM | POA: Diagnosis present

## 2021-12-26 DIAGNOSIS — Z7984 Long term (current) use of oral hypoglycemic drugs: Secondary | ICD-10-CM

## 2021-12-26 DIAGNOSIS — E875 Hyperkalemia: Secondary | ICD-10-CM | POA: Diagnosis present

## 2021-12-26 DIAGNOSIS — I10 Essential (primary) hypertension: Secondary | ICD-10-CM | POA: Diagnosis present

## 2021-12-26 DIAGNOSIS — E43 Unspecified severe protein-calorie malnutrition: Secondary | ICD-10-CM | POA: Diagnosis present

## 2021-12-26 DIAGNOSIS — I739 Peripheral vascular disease, unspecified: Principal | ICD-10-CM

## 2021-12-26 DIAGNOSIS — Z79899 Other long term (current) drug therapy: Secondary | ICD-10-CM

## 2021-12-26 DIAGNOSIS — E1165 Type 2 diabetes mellitus with hyperglycemia: Secondary | ICD-10-CM | POA: Diagnosis present

## 2021-12-26 DIAGNOSIS — I70261 Atherosclerosis of native arteries of extremities with gangrene, right leg: Secondary | ICD-10-CM | POA: Diagnosis present

## 2021-12-26 DIAGNOSIS — Z681 Body mass index (BMI) 19 or less, adult: Secondary | ICD-10-CM

## 2021-12-26 DIAGNOSIS — Z9889 Other specified postprocedural states: Secondary | ICD-10-CM | POA: Diagnosis not present

## 2021-12-26 DIAGNOSIS — I69322 Dysarthria following cerebral infarction: Secondary | ICD-10-CM

## 2021-12-26 DIAGNOSIS — R1310 Dysphagia, unspecified: Secondary | ICD-10-CM | POA: Diagnosis present

## 2021-12-26 DIAGNOSIS — Z86711 Personal history of pulmonary embolism: Secondary | ICD-10-CM

## 2021-12-26 DIAGNOSIS — T82856A Stenosis of peripheral vascular stent, initial encounter: Secondary | ICD-10-CM | POA: Diagnosis not present

## 2021-12-26 DIAGNOSIS — Z7982 Long term (current) use of aspirin: Secondary | ICD-10-CM

## 2021-12-26 DIAGNOSIS — I998 Other disorder of circulatory system: Secondary | ICD-10-CM | POA: Diagnosis not present

## 2021-12-26 LAB — COMPREHENSIVE METABOLIC PANEL
ALT: 18 U/L (ref 0–44)
AST: 46 U/L — ABNORMAL HIGH (ref 15–41)
Albumin: 3.5 g/dL (ref 3.5–5.0)
Alkaline Phosphatase: 78 U/L (ref 38–126)
Anion gap: 10 (ref 5–15)
BUN: 43 mg/dL — ABNORMAL HIGH (ref 6–20)
CO2: 25 mmol/L (ref 22–32)
Calcium: 8.9 mg/dL (ref 8.9–10.3)
Chloride: 102 mmol/L (ref 98–111)
Creatinine, Ser: 1.21 mg/dL (ref 0.61–1.24)
GFR, Estimated: >60 mL/min (ref >60)
Glucose, Bld: 276 mg/dL — ABNORMAL HIGH (ref 70–99)
Potassium: 3.4 mmol/L — ABNORMAL LOW (ref 3.5–5.1)
Sodium: 137 mmol/L (ref 135–145)
Total Bilirubin: 0.9 mg/dL (ref 0.3–1.2)
Total Protein: 8.8 g/dL — ABNORMAL HIGH (ref 6.5–8.1)

## 2021-12-26 LAB — CBC WITH DIFFERENTIAL/PLATELET
Abs Immature Granulocytes: 0.04 10*3/uL (ref 0.00–0.07)
Basophils Absolute: 0 10*3/uL (ref 0.0–0.1)
Basophils Relative: 0 %
Eosinophils Absolute: 0 10*3/uL (ref 0.0–0.5)
Eosinophils Relative: 0 %
HCT: 37.2 % — ABNORMAL LOW (ref 39.0–52.0)
Hemoglobin: 12 g/dL — ABNORMAL LOW (ref 13.0–17.0)
Immature Granulocytes: 0 %
Lymphocytes Relative: 10 %
Lymphs Abs: 1.1 10*3/uL (ref 0.7–4.0)
MCH: 28.6 pg (ref 26.0–34.0)
MCHC: 32.3 g/dL (ref 30.0–36.0)
MCV: 88.6 fL (ref 80.0–100.0)
Monocytes Absolute: 0.7 10*3/uL (ref 0.1–1.0)
Monocytes Relative: 7 %
Neutro Abs: 9.1 10*3/uL — ABNORMAL HIGH (ref 1.7–7.7)
Neutrophils Relative %: 83 %
Platelets: 203 10*3/uL (ref 150–400)
RBC: 4.2 MIL/uL — ABNORMAL LOW (ref 4.22–5.81)
RDW: 13.1 % (ref 11.5–15.5)
WBC: 11 10*3/uL — ABNORMAL HIGH (ref 4.0–10.5)
nRBC: 0 % (ref 0.0–0.2)

## 2021-12-26 LAB — LIPASE, BLOOD: Lipase: 28 U/L (ref 11–51)

## 2021-12-26 LAB — RESP PANEL BY RT-PCR (FLU A&B, COVID) ARPGX2
Influenza A by PCR: NEGATIVE
Influenza B by PCR: NEGATIVE
SARS Coronavirus 2 by RT PCR: NEGATIVE

## 2021-12-26 LAB — TROPONIN I (HIGH SENSITIVITY)
Troponin I (High Sensitivity): 18 ng/L — ABNORMAL HIGH (ref <18)
Troponin I (High Sensitivity): 20 ng/L — ABNORMAL HIGH (ref <18)

## 2021-12-26 LAB — CK: Total CK: 1788 U/L — ABNORMAL HIGH (ref 49–397)

## 2021-12-26 MED ORDER — LEVETIRACETAM 500 MG PO TABS
500.0000 mg | ORAL_TABLET | Freq: Two times a day (BID) | ORAL | Status: DC
  Administered 2021-12-27 – 2022-01-02 (×13): 500 mg via ORAL
  Filled 2021-12-26 (×13): qty 1

## 2021-12-26 MED ORDER — MELATONIN 5 MG PO TABS
5.0000 mg | ORAL_TABLET | Freq: Every day | ORAL | Status: DC
  Administered 2021-12-27 – 2022-01-01 (×6): 5 mg via ORAL
  Filled 2021-12-26 (×7): qty 1

## 2021-12-26 MED ORDER — HEPARIN (PORCINE) 25000 UT/250ML-% IV SOLN
1250.0000 [IU]/h | INTRAVENOUS | Status: DC
  Administered 2021-12-26: 1050 [IU]/h via INTRAVENOUS
  Administered 2021-12-27: 1250 [IU]/h via INTRAVENOUS
  Filled 2021-12-26 (×2): qty 250

## 2021-12-26 MED ORDER — FINASTERIDE 5 MG PO TABS
5.0000 mg | ORAL_TABLET | Freq: Every day | ORAL | Status: DC
  Administered 2021-12-27 – 2022-01-02 (×6): 5 mg via ORAL
  Filled 2021-12-26 (×6): qty 1

## 2021-12-26 MED ORDER — TAMSULOSIN HCL 0.4 MG PO CAPS
0.4000 mg | ORAL_CAPSULE | Freq: Every day | ORAL | Status: DC
  Administered 2021-12-27 – 2022-01-01 (×6): 0.4 mg via ORAL
  Filled 2021-12-26 (×7): qty 1

## 2021-12-26 MED ORDER — ONDANSETRON HCL 4 MG PO TABS: 4.0000 mg | ORAL_TABLET | Freq: Four times a day (QID) | ORAL | Status: DC | PRN

## 2021-12-26 MED ORDER — MORPHINE SULFATE (PF) 2 MG/ML IV SOLN
2.0000 mg | INTRAVENOUS | Status: DC | PRN
  Administered 2021-12-31: 2 mg via INTRAVENOUS
  Filled 2021-12-26: qty 1

## 2021-12-26 MED ORDER — AMLODIPINE BESYLATE 10 MG PO TABS
10.0000 mg | ORAL_TABLET | Freq: Every day | ORAL | Status: DC
  Administered 2021-12-27 – 2022-01-02 (×7): 10 mg via ORAL
  Filled 2021-12-26 (×3): qty 1
  Filled 2021-12-26: qty 2
  Filled 2021-12-26 (×3): qty 1

## 2021-12-26 MED ORDER — IOHEXOL 350 MG/ML SOLN
100.0000 mL | Freq: Once | INTRAVENOUS | Status: AC | PRN
  Administered 2021-12-26: 100 mL via INTRAVENOUS

## 2021-12-26 MED ORDER — ACETAMINOPHEN 650 MG RE SUPP: 650.0000 mg | Freq: Four times a day (QID) | RECTAL | Status: DC | PRN

## 2021-12-26 MED ORDER — ATORVASTATIN CALCIUM 20 MG PO TABS
40.0000 mg | ORAL_TABLET | Freq: Every day | ORAL | Status: DC
  Administered 2021-12-27 – 2022-01-01 (×6): 40 mg via ORAL
  Filled 2021-12-26 (×7): qty 2

## 2021-12-26 MED ORDER — SODIUM CHLORIDE 0.9 % IV BOLUS
1000.0000 mL | Freq: Once | INTRAVENOUS | Status: AC
  Administered 2021-12-26: 1000 mL via INTRAVENOUS

## 2021-12-26 MED ORDER — INSULIN ASPART 100 UNIT/ML IJ SOLN
0.0000 [IU] | INTRAMUSCULAR | Status: DC
  Administered 2021-12-27 (×2): 3 [IU] via SUBCUTANEOUS
  Administered 2021-12-27: 8 [IU] via SUBCUTANEOUS
  Administered 2021-12-28: 11 [IU] via SUBCUTANEOUS
  Filled 2021-12-26 (×3): qty 1

## 2021-12-26 MED ORDER — ASPIRIN 81 MG PO TBEC
81.0000 mg | DELAYED_RELEASE_TABLET | Freq: Every day | ORAL | Status: DC
  Administered 2021-12-27 – 2022-01-02 (×6): 81 mg via ORAL
  Filled 2021-12-26 (×6): qty 1

## 2021-12-26 MED ORDER — HYDROCODONE-ACETAMINOPHEN 5-325 MG PO TABS
1.0000 | ORAL_TABLET | ORAL | Status: DC | PRN
  Administered 2021-12-29 – 2022-01-01 (×4): 2 via ORAL
  Filled 2021-12-26 (×4): qty 2

## 2021-12-26 MED ORDER — SODIUM CHLORIDE 0.9 % IV SOLN: INTRAVENOUS | Status: DC

## 2021-12-26 MED ORDER — ACETAMINOPHEN 325 MG PO TABS
650.0000 mg | ORAL_TABLET | Freq: Four times a day (QID) | ORAL | Status: DC | PRN
  Administered 2021-12-30 – 2021-12-31 (×2): 650 mg via ORAL
  Filled 2021-12-26 (×3): qty 2

## 2021-12-26 MED ORDER — ONDANSETRON HCL 4 MG/2ML IJ SOLN: 4.0000 mg | Freq: Four times a day (QID) | INTRAMUSCULAR | Status: DC | PRN

## 2021-12-26 NOTE — ED Triage Notes (Signed)
Pt denies stroke-like symptoms and states he is here with flu-like symptoms then a minute later denied all flu symptoms; pt knows self, place; disoriented to year and situation; denies HA, slurred speech, weakness, feeling off-balance; speech is understandable but not extremely clear. Pt tracking. Provider JP assessing pt.

## 2021-12-26 NOTE — Assessment & Plan Note (Signed)
Hold Eliquis as patient currently on heparin infusion.

## 2021-12-26 NOTE — ED Provider Triage Note (Signed)
Emergency Medicine Provider Triage Evaluation Note  [name] , a 61 y.o. male  was evaluated in triage.  Pt complains of right knee pain and generalized body aches since this morning. Reprots normally uses a walker but today his leg hurt too much to move.    Patient Active Problem List   Diagnosis Date Noted   Diastolic dysfunction 81/27/5170   Hyperlipidemia LDL goal <70 07/15/2021   Atherosclerosis of native arteries of the extremities with ulceration ([place]) 07/01/2021   Fever    Osteomyelitis (HCC)    Controlled type 2 diabetes mellitus with hyperglycemia, without long-term current use of insulin (HCC)    Pulmonary embolism (HCC) 05/06/2021   Chronic diastolic CHF (congestive heart failure) ([place]) 05/02/2021   Hyponatremia 05/02/2021   Acute urinary retention 05/01/2021   Peripheral artery disease ([place]) 04/30/2021   Foot osteomyelitis, right ([place]) 04/27/2021   Open wound of right foot 04/26/2021   Generalized weakness 04/26/2021   Acute pulmonary embolism ([place]) 04/25/2021   Elevated troponin    CVA (cerebral vascular accident) ([place]) 04/23/2021   Seizure disorder ([place]) 09/16/2018   Essential hypertension 09/14/2018   TIA (transient ischemic attack) 09/14/2018   Right sided weakness 09/14/2018   Stroke ([place]) 01/27/2018   Ataxia 01/26/2018   Hypertensive urgency 01/26/2018   Diabetes mellitus type II, non insulin dependent ([place]) 01/26/2018   Hypokalemia 01/26/2018   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  Review of Systems  Positive: Body aches, hip pain, knee pain, weakness Negative: fever  Physical Exam  BP (!) 140/81 (BP Location: Right Arm)   Pulse 89   Resp 18   SpO2 97%  Gen:   Awake, no distress   Resp:  Normal effort  MSK:   Moves extremities without difficulty  Other:  Normal eyebrow raise, cheek puff, Normal UE strength, able to lift both legs off stretcher (though R knee and hip pain with AROM and PROM), no extremity drift  Medical Decision Making   Medically screening exam initiated at 4:06 PM.  Appropriate orders placed.  [name] was informed that the remainder of the evaluation will be completed by another provider, this initial triage assessment does not replace that evaluation, and the importance of remaining in the ED until their evaluation is complete.     Marquette Old, PA-C 12/26/21 1615

## 2021-12-26 NOTE — Assessment & Plan Note (Signed)
-   Continue sliding scale coverage 

## 2021-12-26 NOTE — ED Triage Notes (Signed)
First Nurse Note:  Arrives from home via [facility]. Called out for stroke.  Hx stroke.  Per EMS report, last known well was Friday.  Had preexisting right sided weakness.  2 stents placed to LE's on Friday and started on eliquis on Friday.  EMS reports global weakness, stroke scale negative.  VS wnl.  CBG:  426.  Also Hx seizures, EM.  18g Left hand.  500 ml NS

## 2021-12-26 NOTE — ED Notes (Signed)
Pt brief changed and placed on a hospital bed.

## 2021-12-26 NOTE — Telephone Encounter (Signed)
Patients sister called in wanting to let Dr know that after having procedure on 12/23/21. Patient is not moving his leg at all. Patient is wetting himself in bed and not wanting to move or get up to move that leg. Patient is only moving towards the right side only. Spoke with NP Eulogio Ditch and she stated that since patient is not moving leg at all and only using right side that patient needed to get to ED ASAP as that could possible be stroke, patients sister voiced understanding and stated that she would take patient

## 2021-12-26 NOTE — Progress Notes (Signed)
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   No Known Allergies  Patient Measurements: Height: 6' (182.9 cm) Weight: 63.5 kg (140 lb) IBW/kg (Calculated) : 77.6 Heparin Dosing Weight: 64 kg   Vital Signs: Temp: 98.9 F (37.2 C) (12/04 2230) Temp Source: Axillary (12/04 2230) BP: 157/84 (12/04 2230) Pulse Rate: 90 (12/04 2230)  Labs: Recent Labs    12/26/21 1615 12/26/21 1809  HGB 12.0*  --   HCT 37.2*  --   PLT 203  --   CREATININE 1.21  --   CKTOTAL 1,788*  --   TROPONINIHS 18* 20*    Estimated Creatinine Clearance: 59 mL/min (by C-G formula based on SCr of 1.21 mg/dL).   Medical History: Past Medical History:  Diagnosis Date   Diabetes mellitus without complication ([place])    Diastolic dysfunction    04/3327 Echo: EF 60-65%, no rwma, GrI DD, Mild AI.   Essential hypertension    Hyperlipidemia LDL goal <70    Osteomyelitis (HCC)    PAD (peripheral artery disease) ([place])    a. 04/2021 PTA: R PT, TP trunk, and stenting of R Popliteal; b. 04/2021 s/p R great toe amputation.   Pulmonary embolism ([place])    a. 04/2021 CTA Chest: PE in dist RPA and prox R upper middle and lower lobe PA branches w/o R heart strain-->eliquis.   Stroke [facility])    a. 04/2021 MRA: chronic microvascular ischemic disease and multiple remote lacunar infarcts involving the deep gray nuclei, pons, and cerebellum.    Medications:    Assessment: Pharmacy consulted to dose heparin in this 61 year old male admitted with restenosis of stent in R leg.  Pt underwent stent placement on 12/1. Pt was on Eliquis 5 mg PO BID, uncertain of last dose.  CrCl = 59 ml/min   Goal of Therapy:  Heparin level 0.3-0.7 units/ml aPTT 66 -102 seconds Monitor platelets by anticoagulation protocol: Yes   Plan:  Pt was on Eliquis 5 mg PO BID PTA, uncertain of last dose.  Will assume pt had AM dose but has not taken PM dose yet.  Will begin  heparin drip on 12/4 @ 2330 at 1050 units/hr. Will use aPTT to guide dosing until correlates with HL. Will draw 1st aPTT and HL 6 hrs after start of drip. Will check CBC daily.   Edmond Ginsberg D 12/26/2021,11:33 PM

## 2021-12-26 NOTE — Assessment & Plan Note (Addendum)
-   Continue amlodipine ?
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
## 2021-12-26 NOTE — ED Notes (Signed)
Family stating patient needs to use the bathroom.  Patient taken to triage three, turned, offered to void.  Patient unable to void.

## 2021-12-26 NOTE — ED Provider Notes (Signed)
[facility] Provider Note    Event Date/Time   First MD Initiated Contact with Patient 12/26/21 2037     (approximate)   History   No chief complaint on file.   HPI  [name] is a 61 y.o. male   Past medical history of PAD status post peripheral vascular catheterization to the right lower extremity on 12/23/2021 by vascular surgery, hypertension, TIA, right-sided weakness, prior CVA, PE on Eliquis who presents to the emergency department with right lower extremity pain and generalized weakness over the last several days.  Family is at bedside, stating the patient has baseline dysarthria and medical chart review notes CVA with right-sided weakness at baseline.  Patient and family states that he had a fall approximately 2 days ago after going home from his surgery onto his right side and has had right lower extremity pain since then.  Unknown downtime.  Patient denies other complaints on review of systems denies shortness of breath, cough, fever, chills, nausea, vomiting, diarrhea or abdominal pain.  He did not strike his head when he fell.  History was obtained via the patient and his family members at bedside.  I also reviewed external medical notes including vascular surgery procedure dated 12/23/2021 for peripheral artery disease      Physical Exam   Triage Vital Signs: ED Triage Vitals  Enc Vitals Group     BP 12/26/21 1601 (!) 140/81     Pulse Rate 12/26/21 1601 89     Resp 12/26/21 1601 18     Temp 12/26/21 1608 99 F (37.2 C)     Temp Source 12/26/21 1608 Oral     SpO2 12/26/21 1601 97 %     Weight 12/26/21 1608 140 lb (63.5 kg)     Height 12/26/21 1608 6' (1.829 m)     Head Circumference --      Peak Flow --      Pain Score 12/26/21 1606 7     Pain Loc --      Pain Edu? --      Excl. in [place]? --     Most recent vital signs: Vitals:   12/26/21 1831 12/26/21 2230  BP: 133/81 (!) 157/84  Pulse: 90 90  Resp: 18 17  Temp:  98.9 F  (37.2 C)  SpO2: 95% 95%    General: Awake, no distress.  CV:  Heart rate is normal and blood pressure is normal, mucus membranes dry Resp:  Normal effort.  Abd:  No distention.  Nontender. Other:  The right lower extremity feels warm and well-perfused but I am unable to palpate a obvious distal pulse.  He is sensate to that extremity.  He is moving his distal extremity but is hesitant to move and range at the knee and hip due to pain.  He has some tenderness to palpation of the calf, popliteal area, posterior and anterior thigh.   ED Results / Procedures / Treatments   Labs (all labs ordered are listed, but only abnormal results are displayed) Labs Reviewed  COMPREHENSIVE METABOLIC PANEL - Abnormal; Notable for the following components:      Result Value   Potassium 3.4 (*)    Glucose, Bld 276 (*)    BUN 43 (*)    Total Protein 8.8 (*)    AST 46 (*)    All other components within normal limits  CBC WITH DIFFERENTIAL/PLATELET - Abnormal; Notable for the following components:   WBC 11.0 (*)  RBC 4.20 (*)    Hemoglobin 12.0 (*)    HCT 37.2 (*)    Neutro Abs 9.1 (*)    All other components within normal limits  CK - Abnormal; Notable for the following components:   Total CK 1,788 (*)    All other components within normal limits  TROPONIN I (HIGH SENSITIVITY) - Abnormal; Notable for the following components:   Troponin I (High Sensitivity) 18 (*)    All other components within normal limits  TROPONIN I (HIGH SENSITIVITY) - Abnormal; Notable for the following components:   Troponin I (High Sensitivity) 20 (*)    All other components within normal limits  RESP PANEL BY RT-PCR (FLU A&B, COVID) [id number]  LIPASE, BLOOD  URINALYSIS, ROUTINE W REFLEX MICROSCOPIC     I reviewed labs and they are notable for blood sugar is elevated to 76 but he has a normal anion gap.  White blood cell count slightly elevated 11.  His hemoglobin is 12, and his CK is 1700.  Initial troponin 18 and  20.  EKG  ED ECG REPORT I, Lucillie Garfinkel, the attending physician, personally viewed and interpreted this ECG.   Date: 12/26/2021  EKG Time: 1617  Rate: 88  Rhythm: normal sinus rhythm  Axis: nl  Intervals:none   ST&T Change: No acute ischemic changes    RADIOLOGY I independently reviewed and interpreted CT of the head and see no obvious bleeding or midline shift   PROCEDURES:  Critical Care performed: No  Procedures   MEDICATIONS ORDERED IN ED: Medications  iohexol (OMNIPAQUE) 350 MG/ML injection 100 mL (100 mLs Intravenous Contrast Given 12/26/21 2155)  sodium chloride 0.9 % bolus 1,000 mL (1,000 mLs Intravenous New Bag/Given 12/26/21 2203)    Consultants:  I spoke with vascular surgery consultation Dr. Ronalee Belts Regarding care plan for this patient.   IMPRESSION / MDM / ASSESSMENT AND PLAN / ED COURSE  I reviewed the triage vital signs and the nursing notes.                              Differential diagnosis includes, but is not limited to, occult fracture of the hip or femur or knee, vascular reocclusion of stenosis operated on previously, rhabdomyolysis, electrolyte derangements or AKI    MDM: This is a patient with peripheral artery disease operated on a few days ago with pain to that lower extremity concerns for other restenosis or vascular problem versus traumatic injuries given his fall.  Fortunately his head scan and neck scan were normal and his x-rays were normal.  We will obtain a CT angiogram to assess for both vascular deficiencies and occult fractures.  He has rhabdomyolysis likely due to traumatic fall, give IV crystalloid infusion.  Given his profound weakness from prior and was able to walk with walker at baseline but now unable he will be admitted.  Vascular surgery Dr. Delana Meyer consulted vascular surgery Dr. Delana Meyer consulted for restenosis on CT angiogram -plan is for heparin and admission, vascular surgery to operate in the morning.  Patient's  presentation is most consistent with acute presentation with potential threat to life or bodily function.       FINAL CLINICAL IMPRESSION(S) / ED DIAGNOSES   Final diagnoses:  Right leg pain  Traumatic rhabdomyolysis, initial encounter ([place])  Peripheral artery disease ([place])     Rx / DC Orders   ED Discharge Orders     None  Note:  This document was prepared using Dragon voice recognition software and may include unintentional dictation errors.    Lucillie Garfinkel, MD 12/26/21 2252

## 2021-12-26 NOTE — Assessment & Plan Note (Addendum)
Continue finasteride and tamsulosin 

## 2021-12-26 NOTE — Assessment & Plan Note (Addendum)
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

## 2021-12-26 NOTE — ED Notes (Signed)
EKG to [place]. in person. Pt leaving for imaging.

## 2021-12-26 NOTE — ED Notes (Signed)
Attempting for EKG; having issues with lots of artifact.

## 2021-12-26 NOTE — Assessment & Plan Note (Addendum)
S/p mechanical thrombectomy, angioplasty and stent placement of right leg on 12/23/2021 PVD CTA showing occlusion of the entire right popliteal/distal femoral artery stent. Continue heparin infusion Pain control.  Continue atorvastatin and aspirin Vascular surgery seen N.p.o. from midnight in advance of procedure/angiogram in the a.m.

## 2021-12-27 ENCOUNTER — Inpatient Hospital Stay: Payer: Medicare Other

## 2021-12-27 ENCOUNTER — Encounter: Payer: Self-pay | Admitting: Internal Medicine

## 2021-12-27 DIAGNOSIS — I69351 Hemiplegia and hemiparesis following cerebral infarction affecting right dominant side: Secondary | ICD-10-CM

## 2021-12-27 DIAGNOSIS — I998 Other disorder of circulatory system: Secondary | ICD-10-CM | POA: Diagnosis not present

## 2021-12-27 DIAGNOSIS — N400 Enlarged prostate without lower urinary tract symptoms: Secondary | ICD-10-CM

## 2021-12-27 DIAGNOSIS — I70203 Unspecified atherosclerosis of native arteries of extremities, bilateral legs: Secondary | ICD-10-CM

## 2021-12-27 DIAGNOSIS — Z86711 Personal history of pulmonary embolism: Secondary | ICD-10-CM

## 2021-12-27 LAB — URINALYSIS, ROUTINE W REFLEX MICROSCOPIC
Bacteria, UA: NONE SEEN
Bilirubin Urine: NEGATIVE
Glucose, UA: 50 mg/dL — AB
Ketones, ur: NEGATIVE mg/dL
Leukocytes,Ua: NEGATIVE
Nitrite: NEGATIVE
Protein, ur: 30 mg/dL — AB
Specific Gravity, Urine: >1.046 — ABNORMAL HIGH (ref 1.005–1.030)
Squamous Epithelial / HPF: NONE SEEN (ref 0–5)
pH: 5 (ref 5.0–8.0)

## 2021-12-27 LAB — CBG MONITORING, ED
Glucose-Capillary: 196 mg/dL — ABNORMAL HIGH (ref 70–99)
Glucose-Capillary: 177 mg/dL — ABNORMAL HIGH (ref 70–99)
Glucose-Capillary: 105 mg/dL — ABNORMAL HIGH (ref 70–99)
Glucose-Capillary: 118 mg/dL — ABNORMAL HIGH (ref 70–99)

## 2021-12-27 LAB — HEPARIN LEVEL (UNFRACTIONATED)
Heparin Unfractionated: >1.1 IU/mL — ABNORMAL HIGH (ref 0.30–0.70)
Heparin Unfractionated: >1.1 IU/mL — ABNORMAL HIGH (ref 0.30–0.70)

## 2021-12-27 LAB — APTT
aPTT: 44 seconds — ABNORMAL HIGH (ref 24–36)
aPTT: 69 seconds — ABNORMAL HIGH (ref 24–36)
aPTT: 46 seconds — ABNORMAL HIGH (ref 24–36)
aPTT: 130 seconds — ABNORMAL HIGH (ref 24–36)

## 2021-12-27 LAB — GLUCOSE, CAPILLARY
Glucose-Capillary: 112 mg/dL — ABNORMAL HIGH (ref 70–99)
Glucose-Capillary: 270 mg/dL — ABNORMAL HIGH (ref 70–99)
Glucose-Capillary: 139 mg/dL — ABNORMAL HIGH (ref 70–99)

## 2021-12-27 LAB — PROTIME-INR
INR: 1.6 — ABNORMAL HIGH (ref 0.8–1.2)
Prothrombin Time: 18.6 seconds — ABNORMAL HIGH (ref 11.4–15.2)

## 2021-12-27 MED ORDER — HEPARIN BOLUS VIA INFUSION
2000.0000 [IU] | Freq: Once | INTRAVENOUS | Status: AC
  Administered 2021-12-27: 2000 [IU] via INTRAVENOUS
  Filled 2021-12-27: qty 2000

## 2021-12-27 MED ORDER — ORAL CARE MOUTH RINSE: 15.0000 mL | OROMUCOSAL | Status: DC | PRN

## 2021-12-27 MED ORDER — HEPARIN (PORCINE) 25000 UT/250ML-% IV SOLN
1200.0000 [IU]/h | INTRAVENOUS | Status: DC
  Administered 2021-12-27 – 2021-12-28 (×2): 1100 [IU]/h via INTRAVENOUS
  Filled 2021-12-27: qty 250

## 2021-12-27 MED ORDER — ORAL CARE MOUTH RINSE
15.0000 mL | OROMUCOSAL | Status: DC
  Administered 2021-12-27 – 2022-01-02 (×17): 15 mL via OROMUCOSAL
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
## 2021-12-27 NOTE — Progress Notes (Addendum)
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   No Known Allergies  Patient Measurements: Height: 6' (182.9 cm) Weight: 63.5 kg (140 lb) IBW/kg (Calculated) : 77.6 Heparin Dosing Weight: 64 kg   Vital Signs: Temp: 99.3 F (37.4 C) (12/05 1631) Temp Source: Oral (12/05 1631) BP: 144/77 (12/05 1631) Pulse Rate: 86 (12/05 1631)  Labs: Recent Labs    12/26/21 1615 12/26/21 1809 12/26/21 2337 12/26/21 2337 12/27/21 0606 12/27/21 1216 12/27/21 1907  HGB 12.0*  --   --   --   --   --   --   HCT 37.2*  --   --   --   --   --   --   PLT 203  --   --   --   --   --   --   APTT  --   --  44*   < > 69* 46* 130*  LABPROT  --   --  18.6*  --   --   --   --   INR  --   --  1.6*  --   --   --   --   HEPARINUNFRC  --   --  >1.10*  --  >1.10*  --   --   CREATININE 1.21  --   --   --   --   --   --   CKTOTAL 1,788*  --   --   --   --   --   --   TROPONINIHS 18* 20*  --   --   --   --   --    < > = values in this interval not displayed.     Estimated Creatinine Clearance: 59 mL/min (by C-G formula based on SCr of 1.21 mg/dL).   Medical History: Past Medical History:  Diagnosis Date   Diabetes mellitus without complication ([place])    Diastolic dysfunction    05/6387 Echo: EF 60-65%, no rwma, GrI DD, Mild AI.   Essential hypertension    Hyperlipidemia LDL goal <70    Osteomyelitis (H[facility])    PAD (peripheral artery disease) ([place])    a. 04/2021 PTA: R PT, TP trunk, and stenting of R Popliteal; b. 04/2021 s/p R great toe amputation.   Pulmonary embolism ([place])    a. 04/2021 CTA Chest: PE in dist RPA and prox R upper middle and lower lobe PA branches w/o R heart strain-->eliquis.   Stroke [facility])    a. 04/2021 MRA: chronic microvascular ischemic disease and multiple remote lacunar infarcts involving the deep gray nuclei, pons, and cerebellum.    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  Pt underwent stent placement on 12/1. Pt on apixaban PTA last dose 12/4 AM. Pharmacy consulted for heparin mgmt.   Date Time aPTT/HL Rate/Comment 12/5  0606 69s / >1.1 Therapeutic; 1050 units/hr 12/5 1216 46s / --  SUBtherapeutic; 1050>1250 units/hr  12/6 1907 130s/ -- SUPRAtherapeutic; 1250 units/hr > hold x 1 hr > 1100 units/hr   Baseline Labs: aPTT - 44s; INR - 1.6; Hgb - 12; Plts - 203  Goal of Therapy:  Heparin level 0.3-0.7 units/ml aPTT 66 -102 seconds Monitor platelets by anticoagulation protocol: Yes  Plan: aPTT supratherapeutic  HOLD heparin x 1 hour, then decrease to 1100 units/hr Check aPTT in 6 hours after rate change; then daily once consecutively therapeutic.  Titrate by aPTT's until lab correlation is noted, then titrate by anti-xa alone. Continue to monitor H&H and platelets daily while on heparin gtt.   Wynelle Cleveland 12/27/2021,7:49 PM

## 2021-12-27 NOTE — ED Notes (Signed)
Per speech therapy medications need to be crushed and given with apple sauce

## 2021-12-27 NOTE — Progress Notes (Addendum)
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   No Known Allergies  Patient Measurements: Height: 6' (182.9 cm) Weight: 63.5 kg (140 lb) IBW/kg (Calculated) : 77.6 Heparin Dosing Weight: 64 kg   Vital Signs: Temp: 98.1 F (36.7 C) (12/05 0916) Temp Source: Oral (12/05 0916) BP: 130/79 (12/05 1030) Pulse Rate: 85 (12/05 1030)  Labs: Recent Labs    12/26/21 1615 12/26/21 1809 12/26/21 2337 12/27/21 0606 12/27/21 1216  HGB 12.0*  --   --   --   --   HCT 37.2*  --   --   --   --   PLT 203  --   --   --   --   APTT  --   --  44* 69* 46*  LABPROT  --   --  18.6*  --   --   INR  --   --  1.6*  --   --   HEPARINUNFRC  --   --  >1.10* >1.10*  --   CREATININE 1.21  --   --   --   --   CKTOTAL 1,788*  --   --   --   --   TROPONINIHS 18* 20*  --   --   --      Estimated Creatinine Clearance: 59 mL/min (by C-G formula based on SCr of 1.21 mg/dL).   Medical History: Past Medical History:  Diagnosis Date   Diabetes mellitus without complication ([place])    Diastolic dysfunction    01/4429 Echo: EF 60-65%, no rwma, GrI DD, Mild AI.   Essential hypertension    Hyperlipidemia LDL goal <70    Osteomyelitis (HCC)    PAD (peripheral artery disease) ([place])    a. 04/2021 PTA: R PT, TP trunk, and stenting of R Popliteal; b. 04/2021 s/p R great toe amputation.   Pulmonary embolism ([place])    a. 04/2021 CTA Chest: PE in dist RPA and prox R upper middle and lower lobe PA branches w/o R heart strain-->eliquis.   Stroke [facility])    a. 04/2021 MRA: chronic microvascular ischemic disease and multiple remote lacunar infarcts involving the deep gray nuclei, pons, and cerebellum.    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  Pt underwent  stent placement on 12/1. Pt on apixaban PTA last dose 12/4 AM. Pharmacy consulted for heparin mgmt.   Date Time aPTT/HL Rate/Comment 12/5  0606 69s / >1.1 Therapeutic; 1050 un/hr 12/5 1216 46s / --- SUBtherapeutic; 1050>1250 un/hr     Baseline Labs: aPTT - 44s; INR - 1.6; Hgb - 12; Plts - 203  Goal of Therapy:  Heparin level 0.3-0.7 units/ml aPTT 66 -102 seconds Monitor platelets by anticoagulation protocol: Yes   Plan:  aPTT subtherapeutic; Last dose of eliquis 5mg  BID (last dose on 12/04 AM), but lab interference persists >1.1 this AM. Give 2000 units bolus x1; then start heparin infusion at 1250 units/hr Check aPTT in 6 hours after rate change; then daily once consecutively therapeutic.  Titrate by aPTT's until lab correlation is noted, then titrate by anti-xa alone. Continue to monitor H&H and platelets daily while on heparin gtt.  [name] 12/27/2021,1:12 PM
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
## 2021-12-27 NOTE — Hospital Course (Signed)
61 y.o. 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  Admitted for acute right lower extremity pain  12/5: Vascular surgery seen, started heparin drip and planning angiogram tomorrow

## 2021-12-27 NOTE — Progress Notes (Signed)
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   No Known Allergies  Patient Measurements: Height: 6' (182.9 cm) Weight: 63.5 kg (140 lb) IBW/kg (Calculated) : 77.6 Heparin Dosing Weight: 64 kg   Vital Signs: Temp: 99.6 F (37.6 C) (12/05 0418) Temp Source: Oral (12/05 0418) BP: 135/73 (12/05 0530) Pulse Rate: 86 (12/05 0530)  Labs: Recent Labs    12/26/21 1615 12/26/21 1809 12/26/21 2337 12/27/21 0606  HGB 12.0*  --   --   --   HCT 37.2*  --   --   --   PLT 203  --   --   --   APTT  --   --  44* 69*  LABPROT  --   --  18.6*  --   INR  --   --  1.6*  --   HEPARINUNFRC  --   --  >1.10* >1.10*  CREATININE 1.21  --   --   --   CKTOTAL 1,788*  --   --   --   TROPONINIHS 18* 20*  --   --      Estimated Creatinine Clearance: 59 mL/min (by C-G formula based on SCr of 1.21 mg/dL).   Medical History: Past Medical History:  Diagnosis Date   Diabetes mellitus without complication ([place])    Diastolic dysfunction    02/2977 Echo: EF 60-65%, no rwma, GrI DD, Mild AI.   Essential hypertension    Hyperlipidemia LDL goal <70    Osteomyelitis (HCC)    PAD (peripheral artery disease) ([place])    a. 04/2021 PTA: R PT, TP trunk, and stenting of R Popliteal; b. 04/2021 s/p R great toe amputation.   Pulmonary embolism ([place])    a. 04/2021 CTA Chest: PE in dist RPA and prox R upper middle and lower lobe PA branches w/o R heart strain-->eliquis.   Stroke [facility])    a. 04/2021 MRA: chronic microvascular ischemic disease and multiple remote lacunar infarcts involving the deep gray nuclei, pons, and cerebellum.    Medications:    Assessment: Pharmacy consulted to dose heparin in this 61 year old male admitted with restenosis of stent in R leg.  Pt underwent stent placement on 12/1. Pt was on Eliquis 5 mg PO BID, uncertain of last dose.  CrCl = 59 ml/min   Goal of Therapy:  Heparin level 0.3-0.7 units/ml aPTT 66  -102 seconds Monitor platelets by anticoagulation protocol: Yes   Plan:  12/5 @ 0606:  HL = >1.10,  aPTT = 69 HL is elevated from Eliquis PTA but aPTT is therapeutic X 1.  Will continue pt on current rate and draw confirmation aPTT in 6 hrs on 12/5 @ 1200.  Will use aPTT to guide dosing until correlating with aPTT. Will recheck HL on 12/6 with AM labs.  Will check CBC daily.   Alayssa Flinchum D 12/27/2021,7:15 AM
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
## 2021-12-27 NOTE — Progress Notes (Signed)
  Progress Note   Patient: [name] GUR:[id number] DOB: 03/19/1960 DOA: 12/26/2021     1 DOS: the patient was seen and examined on 12/27/2021   Brief hospital course: 61 y.o. 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  Admitted for acute right lower extremity pain  12/5: Vascular surgery seen, started heparin drip and planning angiogram tomorrow    Assessment and Plan: * Acute lower limb ischemia S/p mechanical thrombectomy, angioplasty and stent placement of right leg on 12/23/2021 PVD CTA showing occlusion of the entire right popliteal/distal femoral artery stent. Continue heparin infusion Pain control.  Continue atorvastatin and aspirin Vascular surgery seen N.p.o. from midnight in advance of procedure/angiogram in the a.m.  BPH (benign prostatic hyperplasia) Continue finasteride and tamsulosin.  History of pulmonary embolism Hold Eliquis as patient currently on heparin infusion.  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  Essential hypertension Continue amlodipine.  Uncontrolled type 2 diabetes mellitus with hyperglycemia, without long-term current use of insulin (HCC) Continue sliding scale coverage        Subjective: Not able to provide much history.  Patient is sleepy and keeps pointing to his right foot/leg  Physical Exam: Vitals:   12/27/21 1330 12/27/21 1500 12/27/21 1530 12/27/21 1631  BP: 126/76 (!) 148/75 (!) 150/77 (!) 144/77  Pulse: 82 82 84 86  Resp: 16 (!) 21 (!) 25 16  Temp: 98.2 F (36.8 C)   99.3 F (37.4 C)  TempSrc:    Oral  SpO2: 97% 94%  94% 100%  Weight:      Height:       61 year old frail-appearing cachectic male in no acute distress Lungs clear to auscultation bilaterally Cardiovascular regular rate and rhythm Abdomen soft, benign Neuro patient is somnolent, residual right-sided deficit from previous stroke, speaks slowly/dysarthric Extremities: Absent pulse in the right foot Data Reviewed:  MRI of the brain showed no acute intracranial abnormality  Family Communication: Sister/[name] was updated over phone  Disposition: Status is: Inpatient Remains inpatient appropriate because: Management of right lower extremity ischemia, angiogram planned for tomorrow by vascular  Planned Discharge Destination: Skilled nursing facility   DVT prophylaxis-heparin drip Time spent: 35 minutes  Author: Max Sane, MD 12/27/2021 5:21 PM  For on call review [email or web address].

## 2021-12-27 NOTE — ED Notes (Signed)
Lab will ad on A1c.

## 2021-12-27 NOTE — ED Notes (Signed)
Patient alert, tracking with eyes. Able to state first and last name. Speech very garbled. Unable to state correct year or location. Doppler pulse obtained to left DP and left PT. Doppler pulse obtained to right PT. Unable to doppler right DP pulse. Able to move bilateral feet but with extreme weakness. Right second toe discolored black. Heparin gtt infusing at 1050 units per hour. NS infusing at 75 ml/hr. Patient clean and dry at this time. Patient denies pain. NSR on monitor at 80's. 97% on room air. No distress noted.

## 2021-12-28 ENCOUNTER — Encounter
Admission: EM | Disposition: A | Discharge status: Skilled Nursing Facility | Payer: Self-pay | Source: Home / Self Care | Attending: Obstetrics and Gynecology

## 2021-12-28 DIAGNOSIS — I998 Other disorder of circulatory system: Secondary | ICD-10-CM | POA: Diagnosis not present

## 2021-12-28 DIAGNOSIS — T82856A Stenosis of peripheral vascular stent, initial encounter: Secondary | ICD-10-CM | POA: Diagnosis not present

## 2021-12-28 DIAGNOSIS — I743 Embolism and thrombosis of arteries of the lower extremities: Secondary | ICD-10-CM

## 2021-12-28 DIAGNOSIS — Z9889 Other specified postprocedural states: Secondary | ICD-10-CM | POA: Diagnosis not present

## 2021-12-28 DIAGNOSIS — E43 Unspecified severe protein-calorie malnutrition: Secondary | ICD-10-CM | POA: Insufficient documentation

## 2021-12-28 DIAGNOSIS — T82868A Thrombosis of vascular prosthetic devices, implants and grafts, initial encounter: Secondary | ICD-10-CM | POA: Diagnosis not present

## 2021-12-28 DIAGNOSIS — I70221 Atherosclerosis of native arteries of extremities with rest pain, right leg: Secondary | ICD-10-CM | POA: Diagnosis not present

## 2021-12-28 HISTORY — PX: LOWER EXTREMITY ANGIOGRAPHY: CATH118251

## 2021-12-28 LAB — CBC
HCT: 30.7 % — ABNORMAL LOW (ref 39.0–52.0)
Hemoglobin: 9.9 g/dL — ABNORMAL LOW (ref 13.0–17.0)
MCH: 28.7 pg (ref 26.0–34.0)
MCHC: 32.2 g/dL (ref 30.0–36.0)
MCV: 89 fL (ref 80.0–100.0)
Platelets: 198 10*3/uL (ref 150–400)
RBC: 3.45 MIL/uL — ABNORMAL LOW (ref 4.22–5.81)
RDW: 12.9 % (ref 11.5–15.5)
WBC: 9.6 10*3/uL (ref 4.0–10.5)
nRBC: 0 % (ref 0.0–0.2)

## 2021-12-28 LAB — GLUCOSE, CAPILLARY
Glucose-Capillary: 304 mg/dL — ABNORMAL HIGH (ref 70–99)
Glucose-Capillary: 152 mg/dL — ABNORMAL HIGH (ref 70–99)
Glucose-Capillary: 53 mg/dL — ABNORMAL LOW (ref 70–99)
Glucose-Capillary: 120 mg/dL — ABNORMAL HIGH (ref 70–99)
Glucose-Capillary: 101 mg/dL — ABNORMAL HIGH (ref 70–99)
Glucose-Capillary: 179 mg/dL — ABNORMAL HIGH (ref 70–99)
Glucose-Capillary: 302 mg/dL — ABNORMAL HIGH (ref 70–99)

## 2021-12-28 LAB — HEPARIN LEVEL (UNFRACTIONATED)
Heparin Unfractionated: 0.48 IU/mL (ref 0.30–0.70)
Heparin Unfractionated: 0.38 IU/mL (ref 0.30–0.70)
Heparin Unfractionated: 0.34 IU/mL (ref 0.30–0.70)

## 2021-12-28 LAB — HEMOGLOBIN A1C
Hgb A1c MFr Bld: 6.1 % — ABNORMAL HIGH (ref 4.8–5.6)
Mean Plasma Glucose: 128 mg/dL

## 2021-12-28 LAB — APTT: aPTT: 81 seconds — ABNORMAL HIGH (ref 24–36)

## 2021-12-28 SURGERY — LOWER EXTREMITY ANGIOGRAPHY
Anesthesia: Moderate Sedation | Laterality: Right

## 2021-12-28 MED ORDER — ADULT MULTIVITAMIN W/MINERALS CH
1.0000 | ORAL_TABLET | Freq: Every day | ORAL | Status: DC
  Administered 2021-12-28 – 2022-01-02 (×5): 1 via ORAL
  Filled 2021-12-28 (×5): qty 1

## 2021-12-28 MED ORDER — DIPHENHYDRAMINE HCL 50 MG/ML IJ SOLN: 50.0000 mg | Freq: Once | INTRAMUSCULAR | Status: DC | PRN

## 2021-12-28 MED ORDER — CEFAZOLIN SODIUM-DEXTROSE 2-4 GM/100ML-% IV SOLN
INTRAVENOUS | Status: AC
  Administered 2021-12-28: 2 g via INTRAVENOUS
  Filled 2021-12-28: qty 100

## 2021-12-28 MED ORDER — FENTANYL CITRATE (PF) 100 MCG/2ML IJ SOLN
INTRAMUSCULAR | Status: DC | PRN
  Administered 2021-12-28 (×2): 25 ug via INTRAVENOUS
  Administered 2021-12-28: 50 ug via INTRAVENOUS

## 2021-12-28 MED ORDER — ZINC SULFATE 220 (50 ZN) MG PO CAPS
220.0000 mg | ORAL_CAPSULE | Freq: Every day | ORAL | Status: DC
  Administered 2021-12-28 – 2022-01-02 (×5): 220 mg via ORAL
  Filled 2021-12-28 (×5): qty 1

## 2021-12-28 MED ORDER — HEPARIN SODIUM (PORCINE) 1000 UNIT/ML IJ SOLN
INTRAMUSCULAR | Status: AC
  Filled 2021-12-28: qty 10

## 2021-12-28 MED ORDER — MIDAZOLAM HCL 2 MG/2ML IJ SOLN
INTRAMUSCULAR | Status: DC | PRN
  Administered 2021-12-28 (×4): 1 mg via INTRAVENOUS

## 2021-12-28 MED ORDER — INSULIN ASPART 100 UNIT/ML IJ SOLN
0.0000 [IU] | Freq: Every day | INTRAMUSCULAR | Status: DC
  Administered 2021-12-28: 4 [IU] via SUBCUTANEOUS
  Administered 2021-12-30: 2 [IU] via SUBCUTANEOUS
  Administered 2022-01-01: 3 [IU] via SUBCUTANEOUS
  Filled 2021-12-28 (×3): qty 1

## 2021-12-28 MED ORDER — FAMOTIDINE 20 MG PO TABS: 40.0000 mg | ORAL_TABLET | Freq: Once | ORAL | Status: DC | PRN

## 2021-12-28 MED ORDER — INSULIN ASPART 100 UNIT/ML IJ SOLN
0.0000 [IU] | Freq: Three times a day (TID) | INTRAMUSCULAR | Status: DC
  Administered 2021-12-28: 2 [IU] via SUBCUTANEOUS
  Administered 2021-12-29: 1 [IU] via SUBCUTANEOUS
  Administered 2021-12-30: 2 [IU] via SUBCUTANEOUS
  Administered 2021-12-30 – 2021-12-31 (×3): 3 [IU] via SUBCUTANEOUS
  Administered 2021-12-31 (×2): 2 [IU] via SUBCUTANEOUS
  Administered 2022-01-01: 3 [IU] via SUBCUTANEOUS
  Administered 2022-01-01: 2 [IU] via SUBCUTANEOUS
  Administered 2022-01-01: 5 [IU] via SUBCUTANEOUS
  Administered 2022-01-02: 3 [IU] via SUBCUTANEOUS
  Administered 2022-01-02: 7 [IU] via SUBCUTANEOUS
  Filled 2021-12-28 (×13): qty 1

## 2021-12-28 MED ORDER — CHLORHEXIDINE GLUCONATE CLOTH 2 % EX PADS: 6.0000 | MEDICATED_PAD | Freq: Once | CUTANEOUS | Status: DC

## 2021-12-28 MED ORDER — SODIUM CHLORIDE 0.9 % IV SOLN: INTRAVENOUS | Status: DC

## 2021-12-28 MED ORDER — METHYLPREDNISOLONE SODIUM SUCC 125 MG IJ SOLR: 125.0000 mg | Freq: Once | INTRAMUSCULAR | Status: DC | PRN

## 2021-12-28 MED ORDER — IODIXANOL 320 MG/ML IV SOLN
INTRAVENOUS | Status: DC | PRN
  Administered 2021-12-28: 45 mL

## 2021-12-28 MED ORDER — VITAMIN C 500 MG PO TABS
500.0000 mg | ORAL_TABLET | Freq: Two times a day (BID) | ORAL | Status: DC
  Administered 2021-12-28 – 2022-01-02 (×10): 500 mg via ORAL
  Filled 2021-12-28 (×10): qty 1

## 2021-12-28 MED ORDER — MIDAZOLAM HCL 2 MG/2ML IJ SOLN
INTRAMUSCULAR | Status: AC
  Filled 2021-12-28: qty 4

## 2021-12-28 MED ORDER — ONDANSETRON HCL 4 MG/2ML IJ SOLN: 4.0000 mg | Freq: Four times a day (QID) | INTRAMUSCULAR | Status: DC | PRN

## 2021-12-28 MED ORDER — DEXTROSE 50 % IV SOLN
INTRAVENOUS | Status: AC
  Filled 2021-12-28: qty 50

## 2021-12-28 MED ORDER — HEPARIN SODIUM (PORCINE) 1000 UNIT/ML IJ SOLN
INTRAMUSCULAR | Status: DC | PRN
  Administered 2021-12-28: 5000 [IU] via INTRAVENOUS

## 2021-12-28 MED ORDER — HYDROMORPHONE HCL 1 MG/ML IJ SOLN: 1.0000 mg | Freq: Once | INTRAMUSCULAR | Status: DC | PRN

## 2021-12-28 MED ORDER — MIDAZOLAM HCL 2 MG/ML PO SYRP: 8.0000 mg | ORAL_SOLUTION | Freq: Once | ORAL | Status: DC | PRN

## 2021-12-28 MED ORDER — FENTANYL CITRATE PF 50 MCG/ML IJ SOSY
PREFILLED_SYRINGE | INTRAMUSCULAR | Status: AC
  Filled 2021-12-28: qty 2

## 2021-12-28 MED ORDER — CEFAZOLIN SODIUM-DEXTROSE 2-4 GM/100ML-% IV SOLN: 2.0000 g | INTRAVENOUS | Status: AC

## 2021-12-28 MED ORDER — ENSURE ENLIVE PO LIQD
237.0000 mL | Freq: Three times a day (TID) | ORAL | Status: DC
  Administered 2021-12-28 – 2022-01-02 (×10): 237 mL via ORAL

## 2021-12-28 MED ORDER — DEXTROSE 50 % IV SOLN
25.0000 g | INTRAVENOUS | Status: AC
  Administered 2021-12-28: 25 g via INTRAVENOUS

## 2021-12-28 SURGICAL SUPPLY — 25 items
BALLN LUTONIX DCB 6X60X130 (BALLOONS) ×1
BALLN ULTRVRSE 2.5X300X150 (BALLOONS) ×1
BALLN ULTRVRSE 6X40X130 (BALLOONS) ×1
BALLOON LUTONIX DCB 6X60X130 (BALLOONS) IMPLANT
BALLOON ULTRVRSE 2.5X300X150 (BALLOONS) IMPLANT
BALLOON ULTRVRSE 6X40X130 (BALLOONS) IMPLANT
CANISTER PENUMBRA ENGINE (MISCELLANEOUS) IMPLANT
CATH 0.018 NAVICROSS ANG 135 (CATHETERS) IMPLANT
CATH ACCU-VU SIZ PIG 5F 70CM (CATHETERS) IMPLANT
CATH LIGHTNING BOLT 7 130 (CATHETERS) IMPLANT
CATH NAVICROSS ANGLED 135CM (MICROCATHETER) IMPLANT
CATH SEEKER .018X150 (CATHETERS) IMPLANT
CATH VERT 5X100 (CATHETERS) IMPLANT
COVER DRAPE FLUORO 36X44 (DRAPES) IMPLANT
COVER PROBE ULTRASOUND 5X96 (MISCELLANEOUS) IMPLANT
DEVICE STARCLOSE SE CLOSURE (Vascular Products) IMPLANT
GLIDEWIRE ADV .035X260CM (WIRE) IMPLANT
KIT ENCORE 26 ADVANTAGE (KITS) IMPLANT
PACK ANGIOGRAPHY (CUSTOM PROCEDURE TRAY) ×1 IMPLANT
SHEATH BRITE TIP 5FRX11 (SHEATH) IMPLANT
SHEATH PINNACLE MP 7F 45CM (SHEATH) IMPLANT
SYR MEDRAD MARK 7 150ML (SYRINGE) IMPLANT
TUBING CONTRAST HIGH PRESS 72 (TUBING) IMPLANT
WIRE G V18X300CM (WIRE) IMPLANT
WIRE GUIDERIGHT .035X150 (WIRE) IMPLANT

## 2021-12-28 NOTE — Progress Notes (Addendum)
[place] for IV Heparin  Indication: DVT / restenosis of stent in R leg   Patient Measurements: Height: 6' (182.9 cm) Weight: 63.5 kg (140 lb) IBW/kg (Calculated) : 77.6 Heparin Dosing Weight: 64 kg   Labs: Recent Labs    12/26/21 1615 12/26/21 1809 12/26/21 2337 12/26/21 2337 12/27/21 0606 12/27/21 1216 12/27/21 1907 12/28/21 0612 12/28/21 1203  HGB 12.0*  --   --   --   --   --   --  9.9*  --   HCT 37.2*  --   --   --   --   --   --  30.7*  --   PLT 203  --   --   --   --   --   --  198  --   APTT  --   --  44*   < > 69* 46* 130* 81*  --   LABPROT  --   --  18.6*  --   --   --   --   --   --   INR  --   --  1.6*  --   --   --   --   --   --   HEPARINUNFRC  --   --  >1.10*   < > >1.10*  --   --  0.48 0.38  CREATININE 1.21  --   --   --   --   --   --   --   --   CKTOTAL 1,788*  --   --   --   --   --   --   --   --   TROPONINIHS 18* 20*  --   --   --   --   --   --   --    < > = values in this interval not displayed.     Estimated Creatinine Clearance: 59 mL/min (by C-G formula based on SCr of 1.21 mg/dL).   Medical History: Past Medical History:  Diagnosis Date   Diabetes mellitus without complication ([place])    Diastolic dysfunction    01/6107 Echo: EF 60-65%, no rwma, GrI DD, Mild AI.   Essential hypertension    Hyperlipidemia LDL goal <70    Osteomyelitis (HCC)    PAD (peripheral artery disease) ([place])    a. 04/2021 PTA: R PT, TP trunk, and stenting of R Popliteal; b. 04/2021 s/p R great toe amputation.   Pulmonary embolism ([place])    a. 04/2021 CTA Chest: PE in dist RPA and prox R upper middle and lower lobe PA branches w/o R heart strain-->eliquis.   Stroke [facility])    a. 04/2021 MRA: chronic microvascular ischemic disease and multiple remote lacunar infarcts involving the deep gray nuclei, pons, and cerebellum.    Medications:  PTA: eliquis 5mg  BID (last dose on 12/04 AM) Inpatient: +heparin gtt  Assessment: Pharmacy  consulted to dose heparin in this 61 year old male admitted with restenosis of stent in R leg.  Pt underwent stent placement on 12/1. Pt on apixaban PTA last dose 12/4 AM. Pharmacy consulted for heparin mgmt.   Date Time aPTT/HL Rate/Comment 1205  0606 69s / >1.1 Thera x 1; 1050 un/hr 1205 1216 46s / --  SUBthera; 1050 un/hr  12/6 1907 130s / --  SUPRAthera; 1250 un/hr  1206     0612    81s / 0.48       Thera x  1; 1100 units/hr 1206 1203 ---- / 0.38 Patient was in procedure. Drip off 0807 to 1048. Received heparin intra-op. Not reliable level  Baseline Labs: aPTT - 44s; INR - 1.6; Hgb - 12; Plts - 203  Goal of Therapy:  Heparin level 0.3-0.7 units/ml Monitor platelets by anticoagulation protocol: Yes   Plan:  --Patient had endovascular procedure today. Level showed therapeutic as above but drip was off 0807 to 1048 and patient received heparin intra-op. Level is not reliable --Continue heparin infusion at 1100 units/hr --Re-check heparin level 6 hours from heparin re-start --Daily CBC per protocol while on IV heparin   Benita Gutter 12/28/2021,12:50 PM

## 2021-12-28 NOTE — Progress Notes (Signed)
  Progress Note   Patient: [name] YYT:[id number] DOB: Sep 06, 1960 DOA: 12/26/2021     2 DOS: the patient was seen and examined on 12/28/2021   Brief hospital course: 61 y.o. 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  Admitted for acute right lower extremity pain    Assessment and Plan: * Acute lower limb ischemia S/p mechanical thrombectomy, angioplasty and stent placement of right leg on 12/23/2021 PVD CTA showing occlusion of the entire right popliteal/distal femoral artery stent. S/p angiogram with angioplasty today with vascular surgery, not successful - vascular to discuss AKA with family  BPH (benign prostatic hyperplasia) Continue finasteride and tamsulosin.  History of pulmonary embolism Hold Eliquis as patient currently on heparin infusion.  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 MRI with nothing acute. Continue Keppra.  Essential hypertension Continue amlodipine.  Uncontrolled type 2 diabetes mellitus with hyperglycemia, without long-term current use of insulin (HCC) Continue sliding scale coverage        Subjective: asleep after surgery  Physical Exam: Vitals:   12/28/21 0940 12/28/21 0945 12/28/21 1000 12/28/21 1015  BP: (!) 148/76 (!) 147/73 (!) 142/75 (!) 160/77  Pulse: 76 75 77 76  Resp: 16 16 16 16   Temp:      TempSrc:      SpO2: 97% 97% 99% 100%  Weight:      Height:       61 year old frail-appearing cachectic male in no acute distress Lungs clear to auscultation bilaterally Cardiovascular regular rate and rhythm Abdomen soft, benign Neuro patient is  somnolent, Extremities: Absent pulse in the right foot, right foot cold Data Reviewed:  MRI of the brain showed no acute intracranial abnormality  Family Communication: Sister/[name] was updated over phone 12/6  Disposition: Status is: Inpatient Remains inpatient appropriate because: Management of right lower extremity ischemia, angiogram planned for tomorrow by vascular  Planned Discharge Destination: Skilled nursing facility   DVT prophylaxis-heparin drip   Author: Desma Maxim, MD 12/28/2021 12:29 PM  For on call review [email or web address].

## 2021-12-28 NOTE — Op Note (Signed)
[facility]  Percutaneous Study/Intervention Procedural Note   Date of Surgery: 12/28/2021  Surgeon(s):Gal Smolinski    Assistants:none  Pre-operative Diagnosis: PAD with rest [name] right lower extremity  Post-operative diagnosis:  Same  Procedure(s) Performed:             1.  Ultrasound guidance for vascular access left femoral artery             2.  Catheter placement into right common femoral artery from left femoral approach             3.  Aortogram and selective right lower extremity angiogram             4.  Mechanical thrombectomy of the right SFA, popliteal arteries, tibioperoneal trunk, and proximal peroneal artery with the penumbra 7 bolt catheter             5.  Percutaneous transluminal angioplasty with 6 mm diameter Lutonix drug-coated angioplasty balloons in the mid SFA and in the popliteal artery  6.  Percutaneous transluminal angioplasty with 2.5 mm diameter angioplasty balloon in the tibioperoneal trunk and peroneal artery             7.  StarClose closure device left femoral artery  EBL: 100 cc  Contrast: 45 cc  Fluoro Time: 13.1 minutes  Moderate Conscious Sedation Time: approximately 61 minutes using 4 mg of Versed and 100 mcg of Fentanyl              Indications:  Patient is a 61 y.o.male with severe peripheral arterial disease and recurrent rest [name] of the right lower extremity after multiple previous interventions.  He is nonambulatory and essentially nonverbal and his functional status is very poor.  The patient is brought in for angiography for further evaluation and potential treatment.  Due to the limb threatening nature of the situation, angiogram was performed for attempted limb salvage. The patient is aware that if the procedure fails, amputation would be expected.  The patient also understands that even with successful revascularization, amputation may still be required due to the severity of the situation.  Risks and benefits are  discussed and informed consent is obtained.   Procedure:  The patient was identified and appropriate procedural time out was performed.  The patient was then placed supine on the table and prepped and draped in the usual sterile fashion. Moderate conscious sedation was administered during a face to face encounter with the patient throughout the procedure with my supervision of the RN administering medicines and monitoring the patient's vital signs, pulse oximetry, telemetry and mental status throughout from the start of the procedure until the patient was taken to the recovery room. Ultrasound was used to evaluate the left common femoral artery.  It was patent .  A digital ultrasound image was acquired.  A Seldinger needle was used to access the left common femoral artery under direct ultrasound guidance and a permanent image was performed.  A 0.035 J wire was advanced without resistance and a 5Fr sheath was placed.  Pigtail catheter was placed into the aorta and an AP aortogram was performed. This demonstrated normal renal arteries and normal aorta and iliac segments without significant stenosis. I then crossed the aortic bifurcation and advanced to the right femoral head. Selective right lower extremity angiogram was then performed. This demonstrated mild to moderate disease in the common femoral artery and profunda femoris artery.  The superficial femoral artery was patent proximally, but the mid segment was  occluded several centimeters above the previously placed stents.  The distal runoff was extremely sluggish and no distal flow was seen on initial imaging. It was felt that it was in the patient's best interest to proceed with intervention after these images to avoid a second procedure and a larger amount of contrast and fluoroscopy based off of the findings from the initial angiogram. The patient was systemically heparinized and a 7 [place] destination sheath was then placed over the Terumo Advantage wire. I  then used a Kumpe catheter and the advantage wire to easily navigate through the SFA and popliteal occlusions and down into the tibioperoneal trunk and proximal peroneal artery.  Even a selective injection there showed thrombosis with extremely sluggish flow distally.  There was a small posterior tibial artery that reconstituted distally but there was extensive thrombosis of the tibial vessels.  I placed a 0.018 wire and proceeded with mechanical thrombectomy.  Multiple passes were made in the right SFA and popliteal arteries and even down into the tibioperoneal trunk and proximal peroneal artery with large amounts of thrombus removed but the flow remained extremely sluggish.  There was a stenosis at the leading edge of the previously placed stent from thrombus and disease and I ballooned this area with a 6 mm diameter by 6 cm length Lutonix drug-coated angioplasty balloon.  There is also an area in the mid popliteal artery that the catheter is having trouble traversing over the wire that I ballooned with a 6 mm diameter by 4 cm length Lutonix drug-coated angioplasty balloon.  Despite this and multiple passes with thrombectomy device the flow was extremely sluggish distally.  I took a 2.5 mm diameter by 30 cm length angioplasty balloon in the peroneal artery and tibioperoneal trunk and inflated this to 16 atm for 1 minute.  Completion imaging even with the catheter down in this location showed thrombosis of the peroneal artery distally and extensive thrombosis in the tibial vessels.  There remained thrombus and stenosis in the SFA just above the previously placed stent and stenosis in the popliteal artery.  The patient could not straighten his leg and imaging was somewhat poor and at this point with extremely disadvantaged runoff and a nonambulatory patient felt our only reasonable option would be proceeding with an above-knee amputation. I elected to terminate the procedure. The sheath was removed and StarClose  closure device was deployed in the left femoral artery with excellent hemostatic result. The patient was taken to the recovery room in stable condition having tolerated the procedure well.  Findings:               Aortogram:  This demonstrated normal renal arteries and normal aorta and iliac segments without significant stenosis.             Right Lower Extremity: This demonstrated mild to moderate disease in the common femoral artery and profunda femoris artery.  The superficial femoral artery was patent proximally, but the mid segment was occluded several centimeters above the previously placed stents.  The distal runoff was extremely sluggish and no distal flow was seen on initial imaging.   Disposition: Patient was taken to the recovery room in stable condition having tolerated the procedure well.  Complications: None  [name] 12/28/2021 9:26 AM   This note was created with Dragon Medical transcription system. Any errors in dictation are purely unintentional.

## 2021-12-28 NOTE — Interval H&P Note (Signed)
History and Physical Interval Note:  12/28/2021 8:06 AM  [name]  has presented today for surgery, with the diagnosis of Leg pain.  The various methods of treatment have been discussed with the patient and family. After consideration of risks, benefits and other options for treatment, the patient has consented to  Procedure(s): Lower Extremity Angiography (Right) as a surgical intervention.  The patient's history has been reviewed, patient examined, no change in status, stable for surgery.  I have reviewed the patient's chart and labs.  Questions were answered to the patient's satisfaction.     [name]

## 2021-12-28 NOTE — Progress Notes (Signed)
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   No Known Allergies  Patient Measurements: Height: 6' (182.9 cm) Weight: 63.5 kg (140 lb) IBW/kg (Calculated) : 77.6 Heparin Dosing Weight: 64 kg   Vital Signs: Temp: 98.4 F (36.9 C) (12/06 0331) BP: 156/74 (12/06 0331) Pulse Rate: 76 (12/06 0331)  Labs: Recent Labs    12/26/21 1615 12/26/21 1809 12/26/21 2337 12/26/21 2337 12/27/21 0606 12/27/21 1216 12/27/21 1907 12/28/21 0612  HGB 12.0*  --   --   --   --   --   --  9.9*  HCT 37.2*  --   --   --   --   --   --  30.7*  PLT 203  --   --   --   --   --   --  198  APTT  --   --  44*   < > 69* 46* 130* 81*  LABPROT  --   --  18.6*  --   --   --   --   --   INR  --   --  1.6*  --   --   --   --   --   HEPARINUNFRC  --   --  >1.10*  --  >1.10*  --   --  0.48  CREATININE 1.21  --   --   --   --   --   --   --   CKTOTAL 1,788*  --   --   --   --   --   --   --   TROPONINIHS 18* 20*  --   --   --   --   --   --    < > = values in this interval not displayed.     Estimated Creatinine Clearance: 59 mL/min (by C-G formula based on SCr of 1.21 mg/dL).   Medical History: Past Medical History:  Diagnosis Date   Diabetes mellitus without complication ([place])    Diastolic dysfunction    09/3233 Echo: EF 60-65%, no rwma, GrI DD, Mild AI.   Essential hypertension    Hyperlipidemia LDL goal <70    Osteomyelitis (HCC)    PAD (peripheral artery disease) ([place])    a. 04/2021 PTA: R PT, TP trunk, and stenting of R Popliteal; b. 04/2021 s/p R great toe amputation.   Pulmonary embolism ([place])    a. 04/2021 CTA Chest: PE in dist RPA and prox R upper middle and lower lobe PA branches w/o R heart strain-->eliquis.   Stroke [facility])    a. 04/2021 MRA: chronic microvascular ischemic disease and multiple remote lacunar infarcts involving the deep gray nuclei, pons, and cerebellum.    Medications: Heparin Dosing Weight:  64 kg  PTA: eliquis 5mg  BID (last dose on 12/04 AM) Inpatient: +heparin gtt  Assessment: Pharmacy consulted to dose heparin in this 61 year old male admitted with restenosis of stent in R leg.  Pt underwent stent placement on 12/1. Pt on apixaban PTA last dose 12/4 AM. Pharmacy consulted for heparin mgmt.   Date Time aPTT/HL Rate/Comment 12/5  0606 69s / >1.1 Therapeutic; 1050 units/hr 12/5 1216 46s / --  SUBtherapeutic; 1050>1250 units/hr  12/6 1907 130s/ -- SUPRAtherapeutic; 1250 units/hr > hold x 1 hr > 1100 units/hr  12/6     0612    81s / 0.48  Therapeutic X 1 @ 1100 units/hr  Baseline Labs: aPTT - 44s; INR - 1.6; Hgb - 12; Plts - 203  Goal of Therapy:  Heparin level 0.3-0.7 units/ml aPTT 66 -102 seconds Monitor platelets by anticoagulation protocol: Yes   Plan:   12/6 @ 0612:  aPTT = 81 sec,  HL = 0.48 - HL and aPTT now correlate, aPTT and HL are both therapeutic X 1, can use HL to guide dosing from here on - Will draw confirmation HL in 6 hrs on 12/6 @ 1200.  Continue to monitor H&H and platelets daily while on heparin gtt.   Julie Nay D 12/28/2021,6:55 AM

## 2021-12-28 NOTE — Progress Notes (Signed)
[facility] for IV Heparin  Indication: DVT / restenosis of stent in R leg   Patient Measurements: Height: 6' (182.9 cm) Weight: 63.5 kg (140 lb) IBW/kg (Calculated) : 77.6 Heparin Dosing Weight: 64 kg   Labs: Recent Labs    12/26/21 1615 12/26/21 1809 12/26/21 2337 12/27/21 0606 12/27/21 1216 12/27/21 1907 12/28/21 0612 12/28/21 1203 12/28/21 1632  HGB 12.0*  --   --   --   --   --  9.9*  --   --   HCT 37.2*  --   --   --   --   --  30.7*  --   --   PLT 203  --   --   --   --   --  198  --   --   APTT  --   --  44*   < > 46* 130* 81*  --   --   LABPROT  --   --  18.6*  --   --   --   --   --   --   INR  --   --  1.6*  --   --   --   --   --   --   HEPARINUNFRC  --   --  >1.10*   < >  --   --  0.48 0.38 0.34  CREATININE 1.21  --   --   --   --   --   --   --   --   CKTOTAL 1,788*  --   --   --   --   --   --   --   --   TROPONINIHS 18* 20*  --   --   --   --   --   --   --    < > = values in this interval not displayed.     Estimated Creatinine Clearance: 59 mL/min (by C-G formula based on SCr of 1.21 mg/dL).   Medical History: Past Medical History:  Diagnosis Date   Diabetes mellitus without complication ([place])    Diastolic dysfunction    07/9022 Echo: EF 60-65%, no rwma, GrI DD, Mild AI.   Essential hypertension    Hyperlipidemia LDL goal <70    Osteomyelitis (HCC)    PAD (peripheral artery disease) ([place])    a. 04/2021 PTA: R PT, TP trunk, and stenting of R Popliteal; b. 04/2021 s/p R great toe amputation.   Pulmonary embolism ([place])    a. 04/2021 CTA Chest: PE in dist RPA and prox R upper middle and lower lobe PA branches w/o R heart strain-->eliquis.   Stroke [facility])    a. 04/2021 MRA: chronic microvascular ischemic disease and multiple remote lacunar infarcts involving the deep gray nuclei, pons, and cerebellum.    Medications:  PTA: eliquis 5mg  BID (last dose on 12/04 AM) Inpatient: +heparin gtt  Assessment: Pharmacy  consulted to dose heparin in this 61 year old male admitted with restenosis of stent in R leg.  Pt underwent stent placement on 12/1. Pt on apixaban PTA last dose 12/4 AM. Pharmacy consulted for heparin mgmt.   Date Time aPTT/HL Rate/Comment 1205  0606 69s / >1.1 Thera x 1; 1050 un/hr 1205 1216 46s / --  SUBthera; 1050 un/hr  12/6 1907 130s / --  SUPRAthera; 1250 un/hr  1206     0612    81s / 0.48  Thera x 1; 1100 units/hr 1206 1203 ---- / 0.38 Pt OTF in procedure. (Not reliable level) -- Drip off 0807 to 1048.  Received heparin intra-op. -- [phone] --- / 0.34 Thera x2; 1100 un/hr  Baseline Labs: aPTT - 44s; INR - 1.6; Hgb - 12; Plts - 203  Goal of Therapy:  Heparin level 0.3-0.7 units/ml Monitor platelets by anticoagulation protocol: Yes   Plan:  Repeated heparin level therapeutic consecutively. --Continue heparin infusion at 1100 units/hr --Switch to daily heparin level; next HL on 12/07 0500 --Daily CBC per protocol while on IV heparin   Lorna Dibble 12/28/2021,5:22 PM

## 2021-12-28 NOTE — Inpatient Diabetes Management (Signed)
Inpatient Diabetes Program Recommendations  AACE/[name]: New Consensus Statement on Inpatient Glycemic Control   Target Ranges:  Prepandial:   less than 140 mg/dL      Peak postprandial:   less than 180 mg/dL (1-2 hours)      Critically ill patients:  140 - 180 mg/dL    Latest Reference Range & Units 12/27/21 07:17 12/27/21 11:24 12/27/21 16:17 12/27/21 20:19 12/27/21 23:20 12/28/21 03:34 12/28/21 07:42 12/28/21 08:04  Glucose-Capillary 70 - 99 mg/dL 105 (H) 118 (H) 112 (H) 270 (H)  Novolog 8 units 139 (H) 304 (H)  Novolog 11 units 53 (L) 152 (H)   Review of Glycemic Control  Diabetes history: DM2 Outpatient Diabetes medications: Metformin 500 mg BID Current orders for Inpatient glycemic control: Novolog 0-15 units Q4H  Inpatient Diabetes Program Recommendations:    Insulin: Please consider decreasing Novolog to 0-9 units Q4H. Once diet ordered and patient eating, consider changing CBGs to AC&HS and Novolog 0-9 units AC&HS.  Thanks, Barnie Alderman, RN, MSN, [place] Diabetes Coordinator Inpatient Diabetes Program [phone] (Team Pager from 8am to [place])

## 2021-12-28 NOTE — Progress Notes (Signed)
Initial Nutrition Assessment  DOCUMENTATION CODES:   Severe malnutrition in context of chronic illness  INTERVENTION:   -Continue dysphagia 2 diet -Ensure Enlive po TID, each supplement provides 350 kcal and 20 grams of protein.  -MVI with minerals daily -500 mg vitamin BID -220 mg zinc sulfate daily x 14 days  NUTRITION DIAGNOSIS:   Severe Malnutrition related to chronic illness (CVA) as evidenced by moderate fat depletion, severe fat depletion, moderate muscle depletion, severe muscle depletion.  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  Pt admitted with acute lower limb ischemia  12/1- s/p thrombectomy, angioplasty, and stent placement of rt leg 12/5- s/p BSE- dysphagia 2 diet with thin liquids 12/6- s/p aortogram  Reviewed I/O's: +796 ml x 24 hours and +1.8 L since admission  UOP: 500 ml x 24 hours   Pt just returned from procedure. He was very somnolent and did not arouse to voice or touch. No supports at bedside.   Pt with history of CVA and has been recommended a dysphagia 2 diet with thin liquids in the past.   Reviewed wt hx; pt has experienced a 3.1% wt loss over the past 6 months, which is not significant for time frame.   Medications reviewed and include keppra, melatonin, heparin, and 0.9% sodium chloride infusion @ 75 ml/hr.   Labs reviewed: K: 3.4, CBGS: 120-152 (inpatient orders for glycemic control are 0-15 units insulin aspart every 4 hours).    NUTRITION - FOCUSED PHYSICAL EXAM:  Flowsheet Row Most  Recent Value  Orbital Region Severe depletion  Upper Arm Region Severe depletion  Thoracic and Lumbar Region Moderate depletion  Buccal Region Moderate depletion  Temple Region Severe depletion  Clavicle Bone Region Severe depletion  Clavicle and Acromion Bone Region Severe depletion  Scapular Bone Region Severe depletion  Dorsal Hand Moderate depletion  Patellar Region Severe depletion  Anterior Thigh Region Severe depletion  Posterior Calf Region Severe depletion  Edema (RD Assessment) None  Hair Reviewed  Eyes Reviewed  Mouth Reviewed  Skin Reviewed  Nails Reviewed       Diet Order:   Diet Order             DIET DYS 2 Room service appropriate? Yes; Fluid consistency: Thin  Diet effective now                   EDUCATION NEEDS:   Not appropriate for education at this time  Skin:  Skin Assessment: Skin Integrity Issues: Skin Integrity Issues:: Other (Comment) Other: venous stasis ulcer to rt ankle  Last BM:  Unknown  Height:   Ht Readings from Last 1 Encounters:  12/26/21 6' (1.829 m)    Weight:   Wt Readings from Last 1 Encounters:  12/26/21 63.5 kg    Ideal Body Weight:  80.9 kg  BMI:  Body mass index is 18.99 kg/m.  Estimated Nutritional Needs:   Kcal:  1900-2100  Protein:  100-115 grams  Fluid:  > 1.9 L    Loistine Chance, RD, LDN, [place] Registered Dietitian  II Certified Diabetes Care and Education Specialist Please refer to [facility] for RD and/or RD on-call/weekend/after hours pager

## 2021-12-29 ENCOUNTER — Inpatient Hospital Stay: Payer: Medicare Other | Admitting: Certified Registered"

## 2021-12-29 ENCOUNTER — Encounter: Payer: Self-pay | Admitting: Vascular Surgery

## 2021-12-29 ENCOUNTER — Encounter
Admission: EM | Disposition: A | Discharge status: Skilled Nursing Facility | Payer: Self-pay | Source: Home / Self Care | Attending: Obstetrics and Gynecology

## 2021-12-29 ENCOUNTER — Other Ambulatory Visit: Payer: Self-pay

## 2021-12-29 DIAGNOSIS — I998 Other disorder of circulatory system: Secondary | ICD-10-CM | POA: Diagnosis not present

## 2021-12-29 HISTORY — PX: AMPUTATION: SHX166

## 2021-12-29 LAB — BASIC METABOLIC PANEL
Anion gap: 6 (ref 5–15)
BUN: 15 mg/dL (ref 6–20)
CO2: 25 mmol/L (ref 22–32)
Calcium: 7.9 mg/dL — ABNORMAL LOW (ref 8.9–10.3)
Chloride: 109 mmol/L (ref 98–111)
Creatinine, Ser: 0.73 mg/dL (ref 0.61–1.24)
GFR, Estimated: >60 mL/min (ref >60)
Glucose, Bld: 143 mg/dL — ABNORMAL HIGH (ref 70–99)
Potassium: 2.6 mmol/L — CL (ref 3.5–5.1)
Sodium: 140 mmol/L (ref 135–145)

## 2021-12-29 LAB — CBC
HCT: 27.4 % — ABNORMAL LOW (ref 39.0–52.0)
Hemoglobin: 9 g/dL — ABNORMAL LOW (ref 13.0–17.0)
MCH: 28.8 pg (ref 26.0–34.0)
MCHC: 32.8 g/dL (ref 30.0–36.0)
MCV: 87.8 fL (ref 80.0–100.0)
Platelets: 186 10*3/uL (ref 150–400)
RBC: 3.12 MIL/uL — ABNORMAL LOW (ref 4.22–5.81)
RDW: 12.9 % (ref 11.5–15.5)
WBC: 8.5 10*3/uL (ref 4.0–10.5)
nRBC: 0 % (ref 0.0–0.2)

## 2021-12-29 LAB — HEPARIN LEVEL (UNFRACTIONATED): Heparin Unfractionated: 0.22 IU/mL — ABNORMAL LOW (ref 0.30–0.70)

## 2021-12-29 LAB — GLUCOSE, CAPILLARY
Glucose-Capillary: 222 mg/dL — ABNORMAL HIGH (ref 70–99)
Glucose-Capillary: 169 mg/dL — ABNORMAL HIGH (ref 70–99)
Glucose-Capillary: 131 mg/dL — ABNORMAL HIGH (ref 70–99)
Glucose-Capillary: 120 mg/dL — ABNORMAL HIGH (ref 70–99)
Glucose-Capillary: 103 mg/dL — ABNORMAL HIGH (ref 70–99)
Glucose-Capillary: 190 mg/dL — ABNORMAL HIGH (ref 70–99)

## 2021-12-29 LAB — MAGNESIUM: Magnesium: 1.9 mg/dL (ref 1.7–2.4)

## 2021-12-29 LAB — TYPE AND SCREEN
ABO/RH(D): O POS
Antibody Screen: NEGATIVE

## 2021-12-29 LAB — POTASSIUM: Potassium: 3.7 mmol/L (ref 3.5–5.1)

## 2021-12-29 SURGERY — AMPUTATION, ABOVE KNEE
Anesthesia: General | Site: Knee | Laterality: Right

## 2021-12-29 MED ORDER — SODIUM CHLORIDE 0.9 % IV SOLN: INTRAVENOUS | Status: DC

## 2021-12-29 MED ORDER — PROPOFOL 10 MG/ML IV BOLUS
INTRAVENOUS | Status: DC | PRN
  Administered 2021-12-29: 120 mg via INTRAVENOUS

## 2021-12-29 MED ORDER — OXYCODONE HCL 5 MG PO TABS: 5.0000 mg | ORAL_TABLET | Freq: Once | ORAL | Status: DC | PRN

## 2021-12-29 MED ORDER — PHENYLEPHRINE 80 MCG/ML (10ML) SYRINGE FOR IV PUSH (FOR BLOOD PRESSURE SUPPORT)
PREFILLED_SYRINGE | INTRAVENOUS | Status: AC
  Filled 2021-12-29: qty 10

## 2021-12-29 MED ORDER — FENTANYL CITRATE (PF) 100 MCG/2ML IJ SOLN
INTRAMUSCULAR | Status: DC | PRN
  Administered 2021-12-29 (×3): 50 ug via INTRAVENOUS

## 2021-12-29 MED ORDER — EPHEDRINE SULFATE (PRESSORS) 50 MG/ML IJ SOLN
INTRAMUSCULAR | Status: DC | PRN
  Administered 2021-12-29: 5 mg via INTRAVENOUS
  Administered 2021-12-29: 10 mg via INTRAVENOUS
  Administered 2021-12-29: 5 mg via INTRAVENOUS

## 2021-12-29 MED ORDER — CEFAZOLIN SODIUM 1 G IJ SOLR
INTRAMUSCULAR | Status: AC
  Filled 2021-12-29: qty 20

## 2021-12-29 MED ORDER — FENTANYL CITRATE (PF) 100 MCG/2ML IJ SOLN: 25.0000 ug | INTRAMUSCULAR | Status: DC | PRN

## 2021-12-29 MED ORDER — FENTANYL CITRATE (PF) 100 MCG/2ML IJ SOLN
INTRAMUSCULAR | Status: AC
  Filled 2021-12-29: qty 2

## 2021-12-29 MED ORDER — DEXMEDETOMIDINE HCL IN NACL 80 MCG/20ML IV SOLN
INTRAVENOUS | Status: DC | PRN
  Administered 2021-12-29: 8 ug via BUCCAL

## 2021-12-29 MED ORDER — ACETAMINOPHEN 10 MG/ML IV SOLN
INTRAVENOUS | Status: DC | PRN
  Administered 2021-12-29: 1000 mg via INTRAVENOUS

## 2021-12-29 MED ORDER — EPHEDRINE 5 MG/ML INJ
INTRAVENOUS | Status: AC
  Filled 2021-12-29: qty 5

## 2021-12-29 MED ORDER — 0.9 % SODIUM CHLORIDE (POUR BTL) OPTIME
TOPICAL | Status: DC | PRN
  Administered 2021-12-29: 1000 mL

## 2021-12-29 MED ORDER — PHENYLEPHRINE 80 MCG/ML (10ML) SYRINGE FOR IV PUSH (FOR BLOOD PRESSURE SUPPORT)
PREFILLED_SYRINGE | INTRAVENOUS | Status: DC | PRN
  Administered 2021-12-29: 80 ug via INTRAVENOUS
  Administered 2021-12-29 (×2): 160 ug via INTRAVENOUS
  Administered 2021-12-29: 80 ug via INTRAVENOUS
  Administered 2021-12-29: 160 ug via INTRAVENOUS

## 2021-12-29 MED ORDER — POTASSIUM CHLORIDE 10 MEQ/100ML IV SOLN
10.0000 meq | INTRAVENOUS | Status: AC
  Administered 2021-12-29 (×4): 10 meq via INTRAVENOUS
  Filled 2021-12-29 (×4): qty 100

## 2021-12-29 MED ORDER — POTASSIUM CHLORIDE CRYS ER 20 MEQ PO TBCR: 40.0000 meq | EXTENDED_RELEASE_TABLET | Freq: Once | ORAL | Status: DC

## 2021-12-29 MED ORDER — LIDOCAINE HCL (CARDIAC) PF 100 MG/5ML IV SOSY
PREFILLED_SYRINGE | INTRAVENOUS | Status: DC | PRN
  Administered 2021-12-29: 60 mg via INTRAVENOUS

## 2021-12-29 MED ORDER — CHLORHEXIDINE GLUCONATE CLOTH 2 % EX PADS
6.0000 | MEDICATED_PAD | Freq: Once | CUTANEOUS | Status: DC
  Administered 2021-12-29: 6 via TOPICAL

## 2021-12-29 MED ORDER — HEPARIN BOLUS VIA INFUSION
1000.0000 [IU] | Freq: Once | INTRAVENOUS | Status: AC
  Administered 2021-12-29: 1000 [IU] via INTRAVENOUS
  Filled 2021-12-29: qty 1000

## 2021-12-29 MED ORDER — MIDAZOLAM HCL 2 MG/2ML IJ SOLN
INTRAMUSCULAR | Status: AC
  Filled 2021-12-29: qty 2

## 2021-12-29 MED ORDER — POTASSIUM CHLORIDE 20 MEQ PO PACK
40.0000 meq | PACK | Freq: Once | ORAL | Status: AC
  Administered 2021-12-29: 40 meq via ORAL
  Filled 2021-12-29: qty 2

## 2021-12-29 MED ORDER — DEXMEDETOMIDINE HCL IN NACL 80 MCG/20ML IV SOLN
INTRAVENOUS | Status: AC
  Filled 2021-12-29: qty 20

## 2021-12-29 MED ORDER — CEFAZOLIN SODIUM-DEXTROSE 2-4 GM/100ML-% IV SOLN
2.0000 g | INTRAVENOUS | Status: AC
  Administered 2021-12-29: 2 g via INTRAVENOUS

## 2021-12-29 MED ORDER — ACETAMINOPHEN 10 MG/ML IV SOLN
INTRAVENOUS | Status: AC
  Filled 2021-12-29: qty 100

## 2021-12-29 MED ORDER — OXYCODONE HCL 5 MG/5ML PO SOLN: 5.0000 mg | Freq: Once | ORAL | Status: DC | PRN

## 2021-12-29 MED ORDER — ONDANSETRON HCL 4 MG/2ML IJ SOLN
INTRAMUSCULAR | Status: DC | PRN
  Administered 2021-12-29: 4 mg via INTRAVENOUS

## 2021-12-29 SURGICAL SUPPLY — 35 items
BLADE SAGITTAL WIDE XTHICK NO (BLADE) ×1 IMPLANT
BNDG COHESIVE 4X5 TAN STRL LF (GAUZE/BANDAGES/DRESSINGS) ×1 IMPLANT
BNDG ELASTIC 6X5.8 VLCR NS LF (GAUZE/BANDAGES/DRESSINGS) ×1 IMPLANT
BNDG GAUZE DERMACEA FLUFF 4 (GAUZE/BANDAGES/DRESSINGS) ×2 IMPLANT
BRUSH SCRUB EZ  4% CHG (MISCELLANEOUS) ×1
BRUSH SCRUB EZ 4% CHG (MISCELLANEOUS) ×1 IMPLANT
CHLORAPREP W/TINT 26 (MISCELLANEOUS) ×1 IMPLANT
DRAPE INCISE IOBAN 66X45 STRL (DRAPES) ×1 IMPLANT
ELECT CAUTERY BLADE 6.4 (BLADE) ×1 IMPLANT
ELECT REM PT RETURN 9FT ADLT (ELECTROSURGICAL) ×1
ELECTRODE REM PT RTRN 9FT ADLT (ELECTROSURGICAL) ×1 IMPLANT
GAUZE XEROFORM 1X8 LF (GAUZE/BANDAGES/DRESSINGS) ×2 IMPLANT
GLOVE BIO SURGEON STRL SZ7 (GLOVE) ×2 IMPLANT
GOWN STRL REUS W/ TWL LRG LVL3 (GOWN DISPOSABLE) ×2 IMPLANT
GOWN STRL REUS W/ TWL XL LVL3 (GOWN DISPOSABLE) ×1 IMPLANT
GOWN STRL REUS W/TWL LRG LVL3 (GOWN DISPOSABLE) ×2
GOWN STRL REUS W/TWL XL LVL3 (GOWN DISPOSABLE) ×1
HANDLE YANKAUER SUCT BULB TIP (MISCELLANEOUS) ×1 IMPLANT
KIT TURNOVER KIT A (KITS) ×1 IMPLANT
LABEL OR SOLS (LABEL) ×1 IMPLANT
MANIFOLD NEPTUNE II (INSTRUMENTS) ×1 IMPLANT
NS IRRIG 1000ML POUR BTL (IV SOLUTION) ×1 IMPLANT
PACK EXTREMITY ARMC (MISCELLANEOUS) ×1 IMPLANT
PAD ABD DERMACEA PRESS 5X9 (GAUZE/BANDAGES/DRESSINGS) ×2 IMPLANT
PAD PREP 24X41 OB/GYN DISP (PERSONAL CARE ITEMS) ×1 IMPLANT
SPONGE T-LAP 18X18 ~~LOC~~+RFID (SPONGE) ×2 IMPLANT
STAPLER SKIN PROX 35W (STAPLE) ×1 IMPLANT
STOCKINETTE M/LG 89821 (MISCELLANEOUS) ×1 IMPLANT
SUT SILK 2 0 (SUTURE) ×1
SUT SILK 2 0 SH (SUTURE) ×2 IMPLANT
SUT SILK 2-0 18XBRD TIE 12 (SUTURE) ×1 IMPLANT
SUT VIC AB 0 CT1 36 (SUTURE) ×2 IMPLANT
SUT VIC AB 2-0 CT1 (SUTURE) ×2 IMPLANT
TRAP FLUID SMOKE EVACUATOR (MISCELLANEOUS) ×1 IMPLANT
WATER STERILE IRR 500ML POUR (IV SOLUTION) ×1 IMPLANT

## 2021-12-29 NOTE — Anesthesia Preprocedure Evaluation (Addendum)
Anesthesia Evaluation  Patient identified by MRN, date of birth, ID band Patient awake  General Assessment Comment:Patient AOx3, but lethargic and slow to respond. Slurred speech at baseline. Poor historian  Reviewed: Allergy & Precautions, H&P , NPO status , Patient's Chart, lab work & pertinent test results, reviewed documented beta blocker date and time   History of Anesthesia Complications Negative for: history of anesthetic complications  Airway Mallampati: Unable to assess  TM Distance: >3 FB Neck ROM: full    Dental  (+) Poor Dentition, Missing, Dental Advidsory Given   Pulmonary neg shortness of breath, neg COPD, Current Smoker and Patient abstained from smoking.   Pulmonary exam normal breath sounds clear to auscultation       Cardiovascular Exercise Tolerance: Poor hypertension, On Medications + Peripheral Vascular Disease and +CHF  Normal cardiovascular exam Rhythm:Regular Rate:Normal     Neuro/Psych Seizures -,  TIACVA, Residual Symptoms  negative psych ROS   GI/Hepatic negative GI ROS, Neg liver ROS,,,  Endo/Other  diabetes, Poorly Controlled    Renal/GU negative Renal ROS  negative genitourinary   Musculoskeletal   Abdominal   Peds  Hematology negative hematology ROS (+)   Anesthesia Other Findings Per most recent anesthetic evaluation at [facility]: 61 y.o. male who is ASA 4 with the following co morbidities: 1. Cellulitis of second toe of right foot Date not yet scheduled - Request for Pre-Anesthesia Testing - Hemoglobin A1c - Sedimentation Rate - CBC w/ Differential - Basic metabolic panel  2. Osteomyelitis of ankle and foot (CMS-HCC)  - Request for Pre-Anesthesia Testing - Sedimentation Rate - CBC w/ Differential - Basic metabolic panel  3. Cerebrovascular accident (CVA), unspecified mechanism (CMS-HCC) Multiple CVA with some cognitive impairment and right sided weakness. Continues to smoke  tobacco, down to 1 ppd. Hit or miss with taking plavix as well.   CT Angio 2020  Area of ischemia in the anterior left frontal lobe. There is some  bilateral increased T-max which is likely artifactual.  2. No significant core infarct.  3. Question luxury perfusion over the left hemisphere consistent  with recent ischemia.  4. 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  5. The left vertebral artery is patent. There is flow in thedistal  right V4 segment to the posterior communicating artery.  6. 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  7. Prominent posterior communicating arteries bilaterally.  8. No other significant proximal large vessel occlusion.  9. No significant carotid stenosis in the neck. There is mild  tortuosity.  10. Minimal atherosclerotic changes at the aortic arch without  significant stenosis.   4. Type 2 diabetes mellitus with other circulatory complication, without long-term current use of insulin (CMS-HCC) On metformin, labs sent today  5. Seizure (CMS-HCC) Had witnessed seizures in 2020. Prescribed keppra, but has not taken in 2 months since he does not have medicine at home  6. HTN Prescribed amlodipine, but does not currently have any medication at home.     Reproductive/Obstetrics negative OB ROS                             Anesthesia Physical Anesthesia Plan  ASA: 3  Anesthesia Plan: General   Post-op Pain Management: Ofirmev IV (intra-op)* and Dilaudid IV   Induction: Intravenous  PONV Risk Score and Plan: 1 and Ondansetron and Propofol infusion  Airway Management Planned: LMA  Additional Equipment: None  Intra-op Plan:  Post-operative Plan: Extubation in OR  Informed Consent: I have reviewed the patients History and Physical, chart, labs and discussed the procedure including the risks, benefits and alternatives for the proposed anesthesia with the patient or  authorized representative who has indicated his/her understanding and acceptance.     Dental advisory given and Consent reviewed with POA  Plan Discussed with: CRNA, Anesthesiologist and Surgeon  Anesthesia Plan Comments: (Patient's sister [name] consented for risks of anesthesia including but not limited to:  - adverse reactions to medications - damage to eyes, teeth, lips or other oral mucosa - nerve damage due to positioning  - sore throat or hoarseness - Damage to heart, brain, nerves, lungs, other parts of body or loss of life  Patient voiced understanding.)        Anesthesia Quick Evaluation

## 2021-12-29 NOTE — Progress Notes (Signed)
[facility]  Daily Progress Note   Subjective  - POD #1              1.  Ultrasound guidance for vascular access left femoral artery             2.  Catheter placement into right common femoral artery from left                     femoral approach             3.  Aortogram and selective right lower extremity angiogram             4.  Mechanical thrombectomy of the right SFA, popliteal arteries,                   tibioperoneal trunk, and proximal peroneal artery with the                   penumbra 7 bolt catheter             5.  Percutaneous transluminal angioplasty with 6 mm diameter                    Lutonix drug-coated angioplasty balloons in the mid SFA and in                   the popliteal artery             6.  Percutaneous transluminal angioplasty with 2.5 mm diameter                   angioplasty balloon in the tibioperoneal trunk and peroneal artery             7.  StarClose closure device left femoral artery  Objective Vitals:   12/28/21 2000 12/28/21 2349 12/29/21 0343 12/29/21 0753  BP: 136/73 136/69 (!) 142/68 (!) 147/72  Pulse: 88 90 81 80  Resp: 18 18 18 17   Temp: 98.6 F (37 C) 99.5 F (37.5 C) 98.2 F (36.8 C) 99.1 F (37.3 C)  TempSrc: Oral Oral    SpO2: 100% 100% 100% 100%  Weight:      Height:       No intake or output data in the 24 hours ending 12/29/21 1139  PULM  CTAB CV  RRR VASC  Patients right lower extremity remains cold to touch with very faint doppler pulses. Leg remains Painful continuously.   Laboratory CBC    Component Value Date/Time   WBC 8.5 12/29/2021 0401   HGB 9.0 (L) 12/29/2021 0401   HGB 14.8 12/29/2013 1214   HCT 27.4 (L) 12/29/2021 0401   HCT 45.2 12/29/2013 1214   PLT 186 12/29/2021 0401   PLT 143 (L) 12/29/2013 1214    BMET    Component Value Date/Time   NA 140 12/29/2021 0401   NA 135 (L) 12/29/2013 1214   K 2.6 (LL) 12/29/2021 0401   K 3.8 12/29/2013 1214   CL 109 12/29/2021 0401    CL 99 12/29/2013 1214   CO2 25 12/29/2021 0401   CO2 28 12/29/2013 1214   GLUCOSE 143 (H) 12/29/2021 0401   GLUCOSE 234 (H) 12/29/2013 1214   BUN 15 12/29/2021 0401   BUN 8 12/29/2013 1214   CREATININE 0.73 12/29/2021 0401   CREATININE 0.86 12/29/2013 1214   CALCIUM 7.9 (L) 12/29/2021 0401   CALCIUM 8.3 (L) 12/29/2013 1214   GFRNONAA >  60 12/29/2021 0401   GFRNONAA >60 12/29/2013 1214   GFRAA >60 07/12/2019 1311   GFRAA >60 12/29/2013 1214    Assessment/Planning: POD #1   1.  Ultrasound guidance for vascular access left femoral artery             2.  Catheter placement into right common femoral artery from left                     femoral approach             3.  Aortogram and selective right lower extremity angiogram             4.  Mechanical thrombectomy of the right SFA, popliteal arteries,                   tibioperoneal trunk, and proximal peroneal artery with the                   penumbra 7 bolt catheter             5.  Percutaneous transluminal angioplasty with 6 mm diameter                    Lutonix drug-coated angioplasty balloons in the mid SFA and in                   the popliteal artery             6.  Percutaneous transluminal angioplasty with 2.5 mm diameter                   angioplasty balloon in the tibioperoneal trunk and peroneal artery             7.  StarClose closure device left femoral artery   Plan is to take the patient to the operating room today for a right above-the-knee amputation.  Patient's Sister [name] [phone]) was called for consent.  Discussed in detail with her the procedure the risks the complications and the benefits.  She verbalized her understanding.  All questions were answered.  She gives [place] consent as well as the patient consenting to this procedure.  Drema Pry  12/29/2021, 11:39 AM

## 2021-12-29 NOTE — Op Note (Signed)
  [facility]   OPERATIVE NOTE   PROCEDURE:  Right above-the-knee amputation  PRE-OPERATIVE DIAGNOSIS: Right foot gangrene  POST-OPERATIVE DIAGNOSIS: same as above  SURGEON:  [name], MD  ASSISTANT(S): Annalee Genta, NP  ANESTHESIA: general  ESTIMATED BLOOD LOSS: 100 cc  FINDING(S): none  SPECIMEN(S):  Right above-the-knee amputation  INDICATIONS:   [name] is a 61 y.o. male who presents with right lowe leg and foot gangrene.  The patient is scheduled for a right above-the-knee amputation.  I discussed in depth with the patient the risks, benefits, and alternatives to this procedure.  The patient is aware that the risk of this operation included but are not limited to:  bleeding, infection, myocardial infarction, stroke, death, failure to heal amputation wound, and possible need for more proximal amputation.  The patient is aware of the risks and agrees proceed forward with the procedure. An assistant was present during the procedure to help facilitate the exposure and expedite the procedure.   DESCRIPTION: After full informed written consent was obtained from the patient, the patient was taken to the operating room, and placed supine upon the operating table.  Prior to induction, the patient received IV antibiotics. The assistant provided retraction and mobilization to help facilitate exposure and expedite the procedure throughout the entire procedure.  This included following suture, using retractors, and optimizing lighting.  The patient was then prepped and draped in the standard fashion for a right above-the-knee amputation.  After obtaining adequate anesthesia, the patient was prepped and draped in the standard fashion for a above-the-knee amputation.  I marked out the anterior and posterior flaps for a fish-mouth type of amputation.  I made the incisions for these flaps, and then dissected through the subcutaneous tissue, fascia, and muscles  circumferentially.  I elevated  the periosteal tissue 4-5 cm more proximal than the anterior skin flap.  I then transected the femur with a power saw at this level.  Then I smoothed out the rough edges of the bone.  At this point, the specimen was passed off the field as the above-the-knee amputation.  At this point, I clamped all visibly bleeding arteries and veins using a combination of suture ligation with silk suture and electrocautery.   Bleeding continued to be controlled with electrocautery and suture ligature.  The stump was washed off with sterile normal saline and no further active bleeding was noted.  I reapproximated the anterior and posterior fascia  with interrupted stitches of 0 Vicryl.  This was completed along the entire length of anterior and posterior fascia until there were no more loose space in the fascial line. The subcutaneous tissue was then approximated with 2-0 vicryl sutures. The skin was then  reapproximated with staples.  The stump was washed off and dried.  The incision was dressed with Xeroform and ABD pads, and  then fluffs were applied.  Kerlix was wrapped around the leg and then gently an ACE wrap was applied.  A large Ioban was then placed over the ACE wrap to secure the dressing. The patient was then awakened and take to the recovery room in stable condition.   COMPLICATIONS: none  CONDITION: stable  [name]  12/29/2021, 4:50 PM   This note was created with Dragon Medical transcription system. Any errors in dictation are purely unintentional.

## 2021-12-29 NOTE — Anesthesia Postprocedure Evaluation (Signed)
Anesthesia Post Note  Patient: [name]  Procedure(s) Performed: AMPUTATION ABOVE KNEE (Right: Knee)  Patient location during evaluation: PACU Anesthesia Type: General Level of consciousness: awake and alert Pain management: pain level controlled Vital Signs Assessment: post-procedure vital signs reviewed and stable Respiratory status: spontaneous breathing, nonlabored ventilation, respiratory function stable and patient connected to nasal cannula oxygen Cardiovascular status: blood pressure returned to baseline and stable Postop Assessment: no apparent nausea or vomiting Anesthetic complications: no  No notable events documented.   Last Vitals:  Vitals:   12/29/21 1741 12/29/21 1757  BP: (!) 154/78 (!) 156/81  Pulse: 91 87  Resp: 20   Temp: (!) 38 C 37.2 C  SpO2: 99% 99%    Last Pain:  Vitals:   12/29/21 1741  TempSrc:   PainSc: 0-No pain                 Dimas Millin

## 2021-12-29 NOTE — Progress Notes (Addendum)
       CROSS COVER NOTE  NAME: Itamar Mcgowan MRN: [id number] DOB : 03-04-1960    Date of Service   12/29/2021  HPI/Events of Note   Critical potassium 2.6  Assessment and  Interventions   Assessment:  Plan: 40 meq oral 4 runs of 10  Meq IV Mag added to blood in lab     Kathlene Cote NP [facility]

## 2021-12-29 NOTE — Progress Notes (Signed)
[facility] for IV Heparin  Indication: DVT / restenosis of stent in R leg   Patient Measurements: Height: 6' (182.9 cm) Weight: 63.5 kg (140 lb) IBW/kg (Calculated) : 77.6 Heparin Dosing Weight: 64 kg   Labs: Recent Labs    12/26/21 1615 12/26/21 1809 12/26/21 2337 12/27/21 0606 12/27/21 1216 12/27/21 1907 12/28/21 0612 12/28/21 1203 12/28/21 1632 12/29/21 0401  HGB 12.0*  --   --   --   --   --  9.9*  --   --  9.0*  HCT 37.2*  --   --   --   --   --  30.7*  --   --  27.4*  PLT 203  --   --   --   --   --  198  --   --  186  APTT  --   --  44*   < > 46* 130* 81*  --   --   --   LABPROT  --   --  18.6*  --   --   --   --   --   --   --   INR  --   --  1.6*  --   --   --   --   --   --   --   HEPARINUNFRC  --   --  >1.10*   < >  --   --  0.48 0.38 0.34 0.22*  CREATININE 1.21  --   --   --   --   --   --   --   --  0.73  CKTOTAL 1,788*  --   --   --   --   --   --   --   --   --   TROPONINIHS 18* 20*  --   --   --   --   --   --   --   --    < > = values in this interval not displayed.     Estimated Creatinine Clearance: 89.3 mL/min (by C-G formula based on SCr of 0.73 mg/dL).   Medical History: Past Medical History:  Diagnosis Date   Diabetes mellitus without complication ([place])    Diastolic dysfunction    0/2409 Echo: EF 60-65%, no rwma, GrI DD, Mild AI.   Essential hypertension    Hyperlipidemia LDL goal <70    Osteomyelitis (HCC)    PAD (peripheral artery disease) ([place])    a. 04/2021 PTA: R PT, TP trunk, and stenting of R Popliteal; b. 04/2021 s/p R great toe amputation.   Pulmonary embolism ([place])    a. 04/2021 CTA Chest: PE in dist RPA and prox R upper middle and lower lobe PA branches w/o R heart strain-->eliquis.   Stroke [facility])    a. 04/2021 MRA: chronic microvascular ischemic disease and multiple remote lacunar infarcts involving the deep gray nuclei, pons, and cerebellum.    Medications:  PTA: eliquis 5mg  BID (last dose  on 12/04 AM) Inpatient: +heparin gtt  Assessment: Pharmacy consulted to dose heparin in this 61 year old male admitted with restenosis of stent in R leg.  Pt underwent stent placement on 12/1. Pt on apixaban PTA last dose 12/4 AM. Pharmacy consulted for heparin mgmt.   Date Time aPTT/HL Rate/Comment 1205  0606 69s / >1.1 Thera x 1; 1050 un/hr 1205 1216 46s / --  SUBthera; 1050 un/hr  12/6 1907 130s / --  SUPRAthera; 1250 un/hr  1206     0612    81s / 0.48       Thera x 1; 1100 units/hr 1206 1203 ---- / 0.38 Pt OTF in procedure. (Not reliable level) -- Drip off 0807 to 1048.  Received heparin intra-op. -- 1206 1632 --- / 0.34 Thera x2; 1100 un/hr 1207     0401     0.22              SUBtherapeutic   Baseline Labs: aPTT - 44s; INR - 1.6; Hgb - 12; Plts - 203  Goal of Therapy:  Heparin level 0.3-0.7 units/ml Monitor platelets by anticoagulation protocol: Yes   Plan:  12/7:  HL @ 0401 = 0.22, SUBtherapeutic  Will order heparin 1000 units IV 1 bolus and increase drip rate to 1200 units/hr.  Will recheck HL 6 hrs after rate change.  --Daily CBC per protocol while on IV heparin   Larose Batres D 12/29/2021,6:08 AM

## 2021-12-29 NOTE — Transfer of Care (Signed)
Immediate Anesthesia Transfer of Care Note  Patient: [name]  Procedure(s) Performed: AMPUTATION ABOVE KNEE (Right: Knee)  Patient Location: PACU  Anesthesia Type:General  Level of Consciousness: drowsy  Airway & Oxygen Therapy: Patient Spontanous Breathing and Patient connected to face mask oxygen  Post-op Assessment: Report given to RN and Post -op Vital signs reviewed and stable  Post vital signs: Reviewed and stable  Last Vitals:  Vitals Value Taken Time  BP 126/68 12/29/21 1715  Temp    Pulse 85 12/29/21 1718  Resp 22 12/29/21 1718  SpO2 100 % 12/29/21 1718  Vitals shown include unvalidated device data.  Last Pain:  Vitals:   12/29/21 0900  TempSrc:   PainSc: 0-No pain         Complications: No notable events documented.

## 2021-12-29 NOTE — Anesthesia Procedure Notes (Signed)
Procedure Name: LMA Insertion Date/Time: 12/29/2021 4:10 PM  Performed by: Tollie Eth, CRNAPre-anesthesia Checklist: Patient identified, Patient being monitored, Timeout performed, Emergency Drugs available and Suction available Patient Re-evaluated:Patient Re-evaluated prior to induction Oxygen Delivery Method: Circle system utilized Preoxygenation: Pre-oxygenation with 100% oxygen Induction Type: IV induction Ventilation: Mask ventilation without difficulty LMA: LMA with gastric port inserted LMA Size: 4.0 Tube type: Oral Number of attempts: 1 Placement Confirmation: positive ETCO2 and breath sounds checked- equal and bilateral Tube secured with: Tape Dental Injury: Teeth and Oropharynx as per pre-operative assessment

## 2021-12-29 NOTE — Interval H&P Note (Signed)
History and Physical Interval Note:  12/29/2021 2:42 PM  [name]  has presented today for surgery, with the diagnosis of PAD.  The various methods of treatment have been discussed with the patient and family. After consideration of risks, benefits and other options for treatment, the patient has consented to  Procedure(s): AMPUTATION ABOVE KNEE (Right) as a surgical intervention.  The patient's history has been reviewed, patient examined, no change in status, stable for surgery.  I have reviewed the patient's chart and labs.  Questions were answered to the patient's satisfaction.     [name]

## 2021-12-29 NOTE — H&P (View-Only) (Signed)
[facility]  Daily Progress Note   Subjective  - POD #1              1.  Ultrasound guidance for vascular access left femoral artery             2.  Catheter placement into right common femoral artery from left                     femoral approach             3.  Aortogram and selective right lower extremity angiogram             4.  Mechanical thrombectomy of the right SFA, popliteal arteries,                   tibioperoneal trunk, and proximal peroneal artery with the                   penumbra 7 bolt catheter             5.  Percutaneous transluminal angioplasty with 6 mm diameter                    Lutonix drug-coated angioplasty balloons in the mid SFA and in                   the popliteal artery             6.  Percutaneous transluminal angioplasty with 2.5 mm diameter                   angioplasty balloon in the tibioperoneal trunk and peroneal artery             7.  StarClose closure device left femoral artery  Objective Vitals:   12/28/21 2000 12/28/21 2349 12/29/21 0343 12/29/21 0753  BP: 136/73 136/69 (!) 142/68 (!) 147/72  Pulse: 88 90 81 80  Resp: 18 18 18 17   Temp: 98.6 F (37 C) 99.5 F (37.5 C) 98.2 F (36.8 C) 99.1 F (37.3 C)  TempSrc: Oral Oral    SpO2: 100% 100% 100% 100%  Weight:      Height:       No intake or output data in the 24 hours ending 12/29/21 1139  PULM  CTAB CV  RRR VASC  Patients right lower extremity remains cold to touch with very faint doppler pulses. Leg remains Painful continuously.   Laboratory CBC    Component Value Date/Time   WBC 8.5 12/29/2021 0401   HGB 9.0 (L) 12/29/2021 0401   HGB 14.8 12/29/2013 1214   HCT 27.4 (L) 12/29/2021 0401   HCT 45.2 12/29/2013 1214   PLT 186 12/29/2021 0401   PLT 143 (L) 12/29/2013 1214    BMET    Component Value Date/Time   NA 140 12/29/2021 0401   NA 135 (L) 12/29/2013 1214   K 2.6 (LL) 12/29/2021 0401   K 3.8 12/29/2013 1214   CL 109 12/29/2021 0401    CL 99 12/29/2013 1214   CO2 25 12/29/2021 0401   CO2 28 12/29/2013 1214   GLUCOSE 143 (H) 12/29/2021 0401   GLUCOSE 234 (H) 12/29/2013 1214   BUN 15 12/29/2021 0401   BUN 8 12/29/2013 1214   CREATININE 0.73 12/29/2021 0401   CREATININE 0.86 12/29/2013 1214   CALCIUM 7.9 (L) 12/29/2021 0401   CALCIUM 8.3 (L) 12/29/2013 1214   GFRNONAA >  60 12/29/2021 0401   GFRNONAA >60 12/29/2013 1214   GFRAA >60 07/12/2019 1311   GFRAA >60 12/29/2013 1214    Assessment/Planning: POD #1   1.  Ultrasound guidance for vascular access left femoral artery             2.  Catheter placement into right common femoral artery from left                     femoral approach             3.  Aortogram and selective right lower extremity angiogram             4.  Mechanical thrombectomy of the right SFA, popliteal arteries,                   tibioperoneal trunk, and proximal peroneal artery with the                   penumbra 7 bolt catheter             5.  Percutaneous transluminal angioplasty with 6 mm diameter                    Lutonix drug-coated angioplasty balloons in the mid SFA and in                   the popliteal artery             6.  Percutaneous transluminal angioplasty with 2.5 mm diameter                   angioplasty balloon in the tibioperoneal trunk and peroneal artery             7.  StarClose closure device left femoral artery   Plan is to take the patient to the operating room today for a right above-the-knee amputation.  Patient's Sister [name] [phone]) was called for consent.  Discussed in detail with her the procedure the risks the complications and the benefits.  She verbalized her understanding.  All questions were answered.  She gives [place] consent as well as the patient consenting to this procedure.  Drema Pry  12/29/2021, 11:39 AM

## 2021-12-29 NOTE — Care Management Important Message (Signed)
Important Message  Patient Details  Name: [name] MRN: [id number] Date of Birth: Mar 01, 1960   Medicare Important Message Given:  Yes     Dannette Barbara 12/29/2021, 2:08 PM

## 2021-12-29 NOTE — Progress Notes (Signed)
  Progress Note   Patient: [name] GOT:[id number] DOB: 1960/09/05 DOA: 12/26/2021     3 DOS: the patient was seen and examined on 12/29/2021   Brief hospital course: 61 y.o. 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  Admitted for acute right lower extremity pain    Assessment and Plan: * Acute lower limb ischemia S/p mechanical thrombectomy, angioplasty and stent placement of right leg on 12/23/2021 PVD CTA showing occlusion of the entire right popliteal/distal femoral artery stent. S/p angiogram with angioplasty 12/6 with vascular surgery, not successful - today for right AKA - will order pt/ot consults for tomorrow - if he goes home family wants hh rn, hospital bed, which I will order if plan is d/c home  BPH (benign prostatic hyperplasia) Continue finasteride and tamsulosin.  History of pulmonary embolism Hold Eliquis as patient currently on heparin infusion.  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 MRI with nothing acute. Continue Keppra.  Essential hypertension Continue amlodipine.  Uncontrolled type 2 diabetes mellitus with hyperglycemia, without long-term current use of insulin (HCC) Continue sliding scale coverage, may need basal  Hypokalemia Overnight, likely 2/2 reduced po, repleted, repeat wnl - monitor and replete as needed        Subjective: groggy, rouses  Physical Exam: Vitals:   12/28/21 2349 12/29/21 0343 12/29/21 0753 12/29/21 1145  BP: 136/69 (!) 142/68 (!) 147/72 (!) 156/75  Pulse: 90 81 80 82  Resp: 18 18 17 18   Temp: 99.5 F (37.5 C) 98.2 F (36.8 C)  99.1 F (37.3 C) 98.5 F (36.9 C)  TempSrc: Oral     SpO2: 100% 100% 100% 100%  Weight:      Height:       61 year old frail-appearing cachectic male in no acute distress Lungs clear to auscultation bilaterally Cardiovascular regular rate and rhythm Abdomen soft, benign Neuro patient is somnolent, Extremities:  right foot cold Data Reviewed:  MRI of the brain showed no acute intracranial abnormality  Family Communication: Sister/[name] was updated over phone 12/6  Disposition: Status is: Inpatient Remains inpatient appropriate because: Management of right lower extremity ischemia, angiogram planned for tomorrow by vascular  Planned Discharge Destination: Skilled nursing facility   DVT prophylaxis-heparin drip   Author: Desma Maxim, MD 12/29/2021 4:17 PM  For on call review [email or web address].

## 2021-12-30 ENCOUNTER — Encounter: Payer: Self-pay | Admitting: Vascular Surgery

## 2021-12-30 ENCOUNTER — Inpatient Hospital Stay: Payer: Medicare Other

## 2021-12-30 DIAGNOSIS — I998 Other disorder of circulatory system: Secondary | ICD-10-CM | POA: Diagnosis not present

## 2021-12-30 DIAGNOSIS — Z89611 Acquired absence of right leg above knee: Secondary | ICD-10-CM

## 2021-12-30 LAB — CBC
HCT: 25.5 % — ABNORMAL LOW (ref 39.0–52.0)
Hemoglobin: 8.5 g/dL — ABNORMAL LOW (ref 13.0–17.0)
MCH: 28.8 pg (ref 26.0–34.0)
MCHC: 33.3 g/dL (ref 30.0–36.0)
MCV: 86.4 fL (ref 80.0–100.0)
Platelets: 182 10*3/uL (ref 150–400)
RBC: 2.95 MIL/uL — ABNORMAL LOW (ref 4.22–5.81)
RDW: 12.9 % (ref 11.5–15.5)
WBC: 7.9 10*3/uL (ref 4.0–10.5)
nRBC: 0 % (ref 0.0–0.2)

## 2021-12-30 LAB — RESPIRATORY PANEL BY PCR

## 2021-12-30 LAB — URINALYSIS, ROUTINE W REFLEX MICROSCOPIC
Bilirubin Urine: NEGATIVE
Glucose, UA: >=500 mg/dL — AB
Hgb urine dipstick: NEGATIVE
Ketones, ur: NEGATIVE mg/dL
Leukocytes,Ua: NEGATIVE
Nitrite: NEGATIVE
Protein, ur: NEGATIVE mg/dL
Specific Gravity, Urine: 1.016 (ref 1.005–1.030)
pH: 5 (ref 5.0–8.0)

## 2021-12-30 LAB — BASIC METABOLIC PANEL
Anion gap: 4 — ABNORMAL LOW (ref 5–15)
BUN: 11 mg/dL (ref 6–20)
CO2: 25 mmol/L (ref 22–32)
Calcium: 7.7 mg/dL — ABNORMAL LOW (ref 8.9–10.3)
Chloride: 107 mmol/L (ref 98–111)
Creatinine, Ser: 0.64 mg/dL (ref 0.61–1.24)
GFR, Estimated: >60 mL/min (ref >60)
Glucose, Bld: 218 mg/dL — ABNORMAL HIGH (ref 70–99)
Potassium: 3.1 mmol/L — ABNORMAL LOW (ref 3.5–5.1)
Sodium: 136 mmol/L (ref 135–145)

## 2021-12-30 LAB — GLUCOSE, CAPILLARY
Glucose-Capillary: 162 mg/dL — ABNORMAL HIGH (ref 70–99)
Glucose-Capillary: 172 mg/dL — ABNORMAL HIGH (ref 70–99)
Glucose-Capillary: 185 mg/dL — ABNORMAL HIGH (ref 70–99)
Glucose-Capillary: 195 mg/dL — ABNORMAL HIGH (ref 70–99)
Glucose-Capillary: 211 mg/dL — ABNORMAL HIGH (ref 70–99)
Glucose-Capillary: 235 mg/dL — ABNORMAL HIGH (ref 70–99)
Glucose-Capillary: 246 mg/dL — ABNORMAL HIGH (ref 70–99)

## 2021-12-30 LAB — RESP PANEL BY RT-PCR (FLU A&B, COVID) ARPGX2
Influenza A by PCR: NEGATIVE
Influenza B by PCR: NEGATIVE
SARS Coronavirus 2 by RT PCR: NEGATIVE

## 2021-12-30 MED ORDER — INSULIN GLARGINE-YFGN 100 UNIT/ML ~~LOC~~ SOLN
5.0000 [IU] | Freq: Every day | SUBCUTANEOUS | Status: DC
  Administered 2021-12-30 – 2021-12-31 (×2): 5 [IU] via SUBCUTANEOUS
  Filled 2021-12-30 (×3): qty 0.05

## 2021-12-30 MED ORDER — POTASSIUM CHLORIDE CRYS ER 20 MEQ PO TBCR
60.0000 meq | EXTENDED_RELEASE_TABLET | Freq: Once | ORAL | Status: AC
  Administered 2021-12-30: 60 meq via ORAL
  Filled 2021-12-30: qty 3

## 2021-12-30 NOTE — Progress Notes (Signed)
   12/30/21 1025  Assess: MEWS Score  Temp 100.2 F (37.9 C)  BP 138/67  MAP (mmHg) 87  ECG Heart Rate (!) 104  Resp 20  SpO2 100 %  O2 Device Room Air  Assess: MEWS Score  MEWS Temp 0  MEWS Systolic 0  MEWS Pulse 1  MEWS RR 0  MEWS LOC 0  MEWS Score 1  MEWS Score Color Green  Document  Patient Outcome Stabilized after interventions  Assess: SIRS CRITERIA  SIRS Temperature  0  SIRS Pulse 1  SIRS Respirations  0  SIRS WBC 1  SIRS Score Sum  2

## 2021-12-30 NOTE — Evaluation (Signed)
Occupational Therapy Evaluation Patient Details Name: [name] MRN: [id number] DOB: 03-25-1960 Today's Date: 12/30/2021   History of Present Illness [name] is a 61 y.o. 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 He was admitted for acute right lower extremity pain. S/p angiogram with angioplasty 12/6 with vascular surgery, not successful. 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   Clinical Impression   [name] presents with generalized weakness, limited endurance, fatigue, and pain with movement. Due to pt's high level of assistance required, session conducted jointly by PT and OT. Pt reports he lives in a 2-story apt with his brother and that apt is on 2nd floor of apt building, ~ 14 [address]. Neither pt nor his brother drives, sister lives nearby and assists with transportation and shopping. Pt is unable to state clearly his level of fxl mobility PTA. He states that he uses a RW, cannot say how he transports the walker up and down the stairs into/at his apt. During today's evaluation, pt requires Max-Total A for bed mobility, requires Min-Mod A to maintain sitting balance at EOB, is unable to come into standing with +2 assist. He requires Max A for LB dressing. Pt requires Total A +2 to transfer from EOB to recliner, using a slide board. Pt left in recliner, RN and NT notified, chair alarm activated, call bell within reach. Slide board left in room. Pt will require ongoing OT during hospitalization, with DC to SNF to continue the rehab process.     Recommendations for follow up therapy are one component of a multi-disciplinary discharge planning process, led by the attending physician.  Recommendations  may be updated based on patient status, additional functional criteria and insurance authorization.   Follow Up Recommendations  Skilled nursing-short term rehab (<3 hours/day)     Assistance Recommended at Discharge Frequent or constant Supervision/Assistance  Patient can return home with the following A lot of help with walking and/or transfers;A lot of help with bathing/dressing/bathroom;Assistance with cooking/housework;Assist for transportation;Help with stairs or ramp for entrance    Functional Status Assessment  Patient has had a recent decline in their functional status and demonstrates the ability to make significant improvements in function in a reasonable and predictable amount of time.  Equipment Recommendations  None recommended by OT    Recommendations for Other Services       Precautions / Restrictions Precautions Precautions: Fall Restrictions Weight Bearing Restrictions: No Other Position/Activity Restrictions: acute R transfemoral amputation      Mobility Bed Mobility Overal bed mobility: Needs Assistance Bed Mobility: Sit to Supine       Sit to supine: +2 for safety/equipment, Max assist   General bed mobility comments: supine to sit with total A at trunk and LE +2 for lines/safety    Transfers Overall transfer level: Needs assistance Equipment used: Rolling walker (2 wheels), Sliding board Transfers: Sit to/from Stand, Bed to chair/wheelchair/BSC Sit to Stand: +2 safety/equipment, Max assist          Lateral/Scoot Transfers: +2 physical assistance, +2 safety/equipment, With slide board, From elevated surface General transfer comment: Patient attempted sit <> stand at edge of bed with RW with +2 assist but unable to successfully shift forward, place hands correctly, or coordinate motion to get buttocks  off bed. Attempted sit <> stand at edge of bed max A +2 for line/equipment/safety with PT blocking L knee and supporting patient from behind. Patient  unable to fully extend knees/hips and cried out in pain so discontinued. Patient completed bed to chair transfer with total A +2 for physical assist using slide board sliding from left to right. Patient was unable to assist in slide board transfer.      Balance Overall balance assessment: Needs assistance Sitting-balance support: Bilateral upper extremity supported, Feet unsupported, Feet supported Sitting balance-Leahy Scale: Poor Sitting balance - Comments: Patient required min A to maintain seated balance at edge of bed with B UE support and no LE support. Patient able to maintain balance in sitting for a few min at a time with L LE foot on floor. He tends to fall towards the left or backwards without support. Postural control: Posterior lean, Left lateral lean   Standing balance-Leahy Scale: Zero Standing balance comment: Patient unable to stand.                           ADL either performed or assessed with clinical judgement   ADL Overall ADL's : Needs assistance/impaired                     Lower Body Dressing: Maximal assistance Lower Body Dressing Details (indicate cue type and reason): donning sock on L foot Toilet Transfer: Total assistance Toilet Transfer Details (indicate cue type and reason): Anticipated         Functional mobility during ADLs: +2 for physical assistance;+2 for safety/equipment       Vision         Perception     Praxis      Pertinent Vitals/Pain Pain Assessment Faces Pain Scale: Hurts whole lot Breathing: occasional labored breathing, short period of hyperventilation Negative Vocalization: occasional moan/groan, low speech, negative/disapproving quality Facial Expression: facial grimacing Body Language: tense, distressed pacing, fidgeting Consolability: no need to console PAINAD Score: 5 Pain Location: right thigh when attempting to mobilize Pain Descriptors / Indicators: Crying, Guarding, Grimacing, Moaning Pain  Intervention(s): Limited activity within patient's tolerance, Repositioned     Hand Dominance     Extremity/Trunk Assessment Upper Extremity Assessment Upper Extremity Assessment: Generalized weakness;RUE deficits/detail;LUE deficits/detail RUE Deficits / Details: shoulder forward flexion limited to ~ 70* LUE Deficits / Details: shoulder forward flexion limited to ~ 90*   Lower Extremity Assessment Lower Extremity Assessment: RLE deficits/detail;LLE deficits/detail RLE Deficits / Details: limited by pain at hip and transfemoral amputation LLE Deficits / Details: Grossly 3+/5 MMT. Unable to stand on one leg. LLE Coordination: decreased gross motor   Cervical / Trunk Assessment Cervical / Trunk Assessment:  (Patient leans backwards and to the left in sitting.)   Communication Communication Communication: Expressive difficulties   Cognition Arousal/Alertness: Lethargic Behavior During Therapy: Flat affect Overall Cognitive Status: No family/caregiver present to determine baseline cognitive functioning                                       General Comments       Exercises Other Exercises Other Exercises: Educ re: PoC, DC recs, home modifications   Shoulder Instructions      Home Living Family/patient expects to be discharged to:: Private residence Living Arrangements: Other relatives Available Help at Discharge: Family;Available 24 hours/day;Available PRN/intermittently Type of  Home: Apartment Home Access: Stairs to enter [employer] of Steps: 14 Entrance Stairs-Rails: Right;Left Home Layout: Two level Alternate Level Stairs-Number of Steps: flight Alternate Level Stairs-Rails: Left;Right Bathroom Shower/Tub:  (unclear)   Bathroom Toilet: Standard     Home Equipment: Conservation officer, nature (2 wheels);Wheelchair - manual;Shower seat;Hospital bed   Additional Comments: Patient with difficulty answering questions due to dysarthria and limitations in  cognition, so history is incomplete. Pateint states he lives with his brother (who is on disability) in a 2 story apartment wtih 14 steps to enter with handrails to both floors.      Prior Functioning/Environment Prior Level of Function : Needs assist             Mobility Comments: RW for mobility ADLs Comments: He states he and his brother (who is also on disability) clean the home and cooks, sister drives and goes grocery shopping for him. Patient states he needs help getting dressed. He states he ambulates with the walker "sometimes" but is unclear how he gets up and down the steps.        OT Problem List: Decreased strength;Decreased range of motion;Decreased activity tolerance;Impaired balance (sitting and/or standing);Pain;Decreased cognition      OT Treatment/Interventions: Self-care/ADL training;Therapeutic exercise;Patient/family education;Balance training;Energy conservation;Therapeutic activities;DME and/or AE instruction    OT Goals(Current goals can be found in the care plan section) Acute Rehab OT Goals Patient Stated Goal: pt unable to state a goal OT Goal Formulation: With patient Time For Goal Achievement: 01/13/22 Potential to Achieve Goals: Good ADL Goals Pt Will Perform Lower Body Dressing: with min assist;sitting/lateral leans Pt Will Transfer to Toilet: with mod assist;with transfer board;regular height toilet Pt Will Perform Tub/Shower Transfer: shower seat;with transfer board;with mod assist Additional ADL Goal #1: Pt will demonstrate how to perform residual limb desensitization  OT Frequency: Min 2X/week    Co-evaluation PT/OT/SLP Co-Evaluation/Treatment: Yes Reason for Co-Treatment: Necessary to address cognition/behavior during functional activity;To address functional/ADL transfers;For patient/therapist safety;Complexity of the patient's impairments (multi-system involvement) PT goals addressed during session: Mobility/safety with  mobility;Balance;Proper use of DME;Strengthening/ROM OT goals addressed during session: ADL's and self-care;Proper use of Adaptive equipment and DME;Strengthening/ROM      AM-PAC OT "6 Clicks" Daily Activity     Outcome Measure Help from another person eating meals?: A Little Help from another person taking care of personal grooming?: A Little Help from another person toileting, which includes using toliet, bedpan, or urinal?: Total Help from another person bathing (including washing, rinsing, drying)?: A Lot Help from another person to put on and taking off regular upper body clothing?: A Little Help from another person to put on and taking off regular lower body clothing?: A Lot 6 Click Score: 14   End of Session Equipment Utilized During Treatment: Gait belt;Rolling walker (2 wheels);Other (comment) (sliding board) Nurse Communication: Mobility status  Activity Tolerance: Patient limited by pain;Patient limited by lethargy Patient left: in chair;with call bell/phone within reach;with chair alarm set  OT Visit Diagnosis: Unsteadiness on feet (R26.81);Other abnormalities of gait and mobility (R26.89);Muscle weakness (generalized) (M62.81);Other symptoms and signs involving cognitive function;Pain Pain - Right/Left: Right Pain - part of body: Leg                Time: 5277-8242 OT Time Calculation (min): 45 min Charges:  OT General Charges $OT Visit: 1 Visit OT Evaluation $OT Eval Moderate Complexity: 1 Mod OT Treatments $Self Care/Home Management : 8-22 mins Josiah Lobo, PhD, MS, OTR/L 12/30/21, 10:43 AM

## 2021-12-30 NOTE — Plan of Care (Signed)

## 2021-12-30 NOTE — Evaluation (Signed)
Physical Therapy Evaluation Patient Details Name: [name] MRN: [id number] DOB: Sep 22, 1960 Today's Date: 12/30/2021  History of Present Illness  [name] is a 61 y.o. 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 He was admitted for acute right lower extremity pain. S/p angiogram with angioplasty 12/6 with vascular surgery, not successful. 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   Clinical Impression  Patient evaluated with OT for safety and need for +2 assistance. Patient talking with OT upon arrival. He had difficulty with cognition and speech and history was unclear. He reports living in a 2 story apartment with 14 steps to enter and a flight of stairs to his bedroom with 2 handrails on both sets of stairs. He lives with his brother who is on disability. He and his brother clean and cook, and his sister who lives near by gets groceries and drives them around. He states he usually needs help to put on his socks and is unclear how he bathes or how his bathroom is set up except he says he has a shower chair. He reports ambulating with a walker "sometimes." Upon PT evaluation, patient required total A for supine to sit transfer, needed min A to maintain seated balance with no LE support and occasional min A for seated balance with L LE support. His trunk tends to lean left and backwards at times and he fatigues quickly. He had limited ability to use his UE for support during mobility attempts. He was not able to stand effectively at edge of bed but was able to partially lift buttocks of bed with max A +2 with PT blocking L knee and supporting from behind. He transferred bed to chair left to right with slide  board transfer total A +2. Patient has appears to have experienced a decline in functional strength and independence and is not safe to return home without SNF level care. He needs short term rehab prior to returning home to improve safety and functional strength. Patient would benefit from skilled physical therapy to address impairments and functional limitations (see PT Problem List below) to work towards stated goals and return to PLOF or maximal functional independence.       Recommendations for follow up therapy are one component of a multi-disciplinary discharge planning process, led by the attending physician.  Recommendations may be updated based on patient status, additional functional criteria and insurance authorization.  Follow Up Recommendations Skilled nursing-short term rehab (<3 hours/day) Can patient physically be transported by private vehicle: No    Assistance Recommended at Discharge Intermittent Supervision/Assistance  Patient can return home with the following  Two people to help with bathing/dressing/bathroom;Assistance with cooking/housework;Two people to help with walking and/or transfers;Assist for transportation (is dependent for all care and mobility at this point and is not safe to go home without SNF level care.)    Equipment Recommendations BSC/3in1 (hoyer lift)  Recommendations for Other Services       Functional Status Assessment Patient has had a recent decline in their functional status and demonstrates the ability to make significant improvements in function in a reasonable and predictable amount of time.     Precautions / Restrictions Precautions Precautions: Fall Restrictions Weight Bearing Restrictions: No Other Position/Activity Restrictions: acute R transfemoral amputation  Mobility  Bed Mobility Overal bed mobility: Needs Assistance Bed Mobility: Sit to Supine       Sit to supine: +2 for safety/equipment, Max assist   General bed  mobility comments: supine to sit with total A at trunk and LE +2 for lines/safety    Transfers Overall transfer level: Needs assistance Equipment used: Rolling walker (2 wheels), None, Sliding board Transfers: Sit to/from Stand, Bed to chair/wheelchair/BSC Sit to Stand: +2 safety/equipment, Max assist          Lateral/Scoot Transfers: +2 physical assistance, +2 safety/equipment, With slide board, From elevated surface General transfer comment: Patient attempted sit <> stand at edge of bed with RW with +2 assist but unable to successfully shift forward, place hands correctly, or coordinate motion to get buttocks off bed. Attempted sit <> stand at edge of bed max A +2 for line/equipment/safety with PT blocking L knee and supporting patient from behind. Patient unable to fully extend knees/hips and cried out in pain so discontinued. Patient completed bed to chair transfer with total A +2 for physical assist using slide board sliding from left to right. Patient was unable to assist in slide board transfer.    Ambulation/Gait               General Gait Details: unable  Stairs            Wheelchair Mobility    Modified Rankin (Stroke Patients Only)       Balance Overall balance assessment: Needs assistance Sitting-balance support: Bilateral upper extremity supported, Feet unsupported, Feet supported Sitting balance-Leahy Scale: Poor Sitting balance - Comments: Patient required min A to maintain seated balance at edge of bed with B UE support and no LE support. Patient able to maintain balance in sitting for a few min at a time with L LE foot on floor. He tends to fall towards the left or backwards without support. Postural control: Posterior lean, Left lateral lean   Standing balance-Leahy Scale: Zero Standing balance comment: Patient unable to stand.                             Pertinent Vitals/Pain Pain Assessment Pain Assessment: Faces Faces Pain Scale:  Hurts whole lot Pain Location: right thigh when attempting to mobilize Pain Descriptors / Indicators: Crying, Guarding, Grimacing, Moaning Pain Intervention(s): Limited activity within patient's tolerance, Monitored during session, Repositioned    Home Living Family/patient expects to be discharged to:: Private residence Living Arrangements: Other relatives Available Help at Discharge: Family;Available 24 hours/day;Available PRN/intermittently Type of Home: Apartment Home Access: Stairs to enter Entrance Stairs-Rails: Right;Left Entrance Stairs-Number of Steps: 14 Alternate Level Stairs-Number of Steps: flight Home Layout: Two level Home Equipment: Conservation officer, nature (2 wheels);Wheelchair - manual;Shower seat;Hospital bed Additional Comments: Patient with difficulty answering questions due to dysarthria and limitations in cognition, so history is incomplete. Pateint states he lives with his brother (who is on disability) in a 2 story apartment wtih 14 steps to enter with handrails to both floors.    Prior Function Prior Level of Function : Needs assist             Mobility Comments: RW for mobility ADLs Comments: He states he and his brother (who is also on disability) clean the home and cooks, sister drives and goes grocery shopping for him. Patient states he needs help getting dressed. He states he ambulates with the walker "sometimes" but is unclear how he gets up  and down the steps.     Hand Dominance        Extremity/Trunk Assessment   Upper Extremity Assessment Upper Extremity Assessment: Defer to OT evaluation    Lower Extremity Assessment Lower Extremity Assessment: RLE deficits/detail;LLE deficits/detail RLE Deficits / Details: limited by pain at hip and transfemoral amputation LLE Deficits / Details: Grossly 3+/5 MMT. Unable to stand on one leg. LLE Coordination: decreased gross motor    Cervical / Trunk Assessment Cervical / Trunk Assessment:  (Patient leans  backwards and to the left in sitting.)  Communication   Communication: Expressive difficulties  Cognition   Behavior During Therapy: Flat affect Overall Cognitive Status: No family/caregiver present to determine baseline cognitive functioning                                 General Comments: Patient has difficulty following conversation, delayed response attempting to follow commands, is unable to figure out how to use TV remote effectively after extra time given.        General Comments      Exercises     Assessment/Plan    PT Assessment Patient needs continued PT services  PT Problem List Decreased strength;Decreased range of motion;Decreased activity tolerance;Decreased balance;Decreased mobility;Decreased knowledge of use of DME;Decreased cognition;Decreased knowledge of precautions;Pain       PT Treatment Interventions DME instruction;Functional mobility training;Gait training;Stair training;Therapeutic activities;Therapeutic exercise;Cognitive remediation;Neuromuscular re-education;Wheelchair mobility training;Balance training;Patient/family education    PT Goals (Current goals can be found in the Care Plan section)  Acute Rehab PT Goals PT Goal Formulation: Patient unable to participate in goal setting    Frequency Min 2X/week     Co-evaluation PT/OT/SLP Co-Evaluation/Treatment: Yes Reason for Co-Treatment: Complexity of the patient's impairments (multi-system involvement);Necessary to address cognition/behavior during functional activity;For patient/therapist safety;To address functional/ADL transfers PT goals addressed during session: Mobility/safety with mobility;Balance;Proper use of DME;Strengthening/ROM         AM-PAC PT "6 Clicks" Mobility  Outcome Measure Help needed turning from your back to your side while in a flat bed without using bedrails?: Total Help needed moving from lying on your back to sitting on the side of a flat bed without  using bedrails?: Total Help needed moving to and from a bed to a chair (including a wheelchair)?: Total Help needed standing up from a chair using your arms (e.g., wheelchair or bedside chair)?: Total Help needed to walk in hospital room?: Total Help needed climbing 3-5 steps with a railing? : Total 6 Click Score: 6    End of Session Equipment Utilized During Treatment: Gait belt Activity Tolerance: Patient limited by pain;Patient tolerated treatment well Patient left: in chair;with call bell/phone within reach;with chair alarm set Nurse Communication: Mobility status PT Visit Diagnosis: Unsteadiness on feet (R26.81);Muscle weakness (generalized) (M62.81);Difficulty in walking, not elsewhere classified (R26.2)    Time: 4356-8616 PT Time Calculation (min) (ACUTE ONLY): 36 min   Charges:   PT Evaluation $PT Eval Moderate Complexity: 1 Mod          Jeriko Kowalke R. Graylon Good, PT, DPT 12/30/21, 9:46 AM

## 2021-12-30 NOTE — Progress Notes (Signed)
  Progress Note   Patient: [name] GNO:[id number] DOB: 10-07-1960 DOA: 12/26/2021     4 DOS: the patient was seen and examined on 12/30/2021   Brief hospital course: 61 y.o. 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  Admitted for acute right lower extremity pain    Assessment and Plan: * Acute lower limb ischemia S/p mechanical thrombectomy, angioplasty and stent placement of right leg on 12/23/2021 PVD Right above the knee amputation CTA showing occlusion of the entire right popliteal/distal femoral artery stent. S/p angiogram with angioplasty 12/6 with vascular surgery, not successful Now s/p AKA on 12/7 with vascular - PT/OT advising snf, toc consulted  Fever Overnight and this morning. No localizing s/s. - will check urinalysis with culture, blood cultures, covid and respiratory panel, cxr  BPH (benign prostatic hyperplasia) Continue finasteride and tamsulosin.  History of pulmonary embolism Hold Eliquis as patient currently on heparin infusion. 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 MRI with nothing acute. Continue Keppra.  Essential hypertension Continue amlodipine.  Uncontrolled type 2 diabetes mellitus with hyperglycemia, without long-term current use of insulin (HCC) Continue sliding scale coverage, start basal semglee 5 qhs  Hypokalemia 3.1 - replete        Subjective: groggy, rouses  Physical Exam: Vitals:   12/30/21 0319 12/30/21 0919 12/30/21 1025 12/30/21 1135  BP: (!) 161/72 (!) 148/77 138/67 139/66  Pulse: 86 96  91  Resp:  16 (!) 22 20 (!) 24  Temp: 98.9 F (37.2 C) (!) 100.6 F (38.1 C) 100.2 F (37.9 C) 99.2 F (37.3 C)  TempSrc: Oral     SpO2: 100% 100% 100% 100%  Weight:      Height:       61 year old frail-appearing cachectic male in no acute distress Lungs clear to auscultation bilaterally Cardiovascular regular rate and rhythm Abdomen soft, benign Neuro patient is somnolent, Extremities:  right foot cold Data Reviewed:  MRI of the brain showed no acute intracranial abnormality  Family Communication: Sister/[name] was updated over phone 12/6  Disposition: Status is: Inpatient Remains inpatient appropriate because: Management of right lower extremity ischemia, angiogram planned for tomorrow by vascular  Planned Discharge Destination: Skilled nursing facility   DVT prophylaxis-heparin drip   Author: Desma Maxim, MD 12/30/2021 1:48 PM  For on call review [email or web address].

## 2021-12-30 NOTE — TOC Initial Note (Addendum)
Transition of Care [facility]) - Initial/Assessment Note    Patient Details  Name: [name] MRN: [id number] Date of Birth: 1960/10/04  Transition of Care [facility]) CM/SW Contact:    Quin Hoop, LCSW Phone Number: 12/30/2021, 3:19 PM  Clinical Narrative:                 CSW met with patient to complete readmission assessment.  Pt states that he lives with his brother, [name].  He confirmed that his PCP is Dr. Fuller Song and he receives his medicines from [facility].  Patient states that he has the following DME at home:  walker, cane and wheelchair.  His brother helps get him to and from appointments.  CSW discussed SNF recommendations for discharge planning.  Patient has a bed offer from [facility] and he declined it.  Patient states that he will pay for PDN and he's ok with getting home health services.  CSW phoned patient's brother, [name], to discuss discharge and he's aware that pt doesn't want to go to [facility].  CSW also lvm for his sisters, Shauna Hugh and [name].  TOC will continue to follow for discharge planning.  Expected Discharge Plan: Skilled Nursing Facility Barriers to Discharge: Continued Medical Work up   Patient Goals and CMS Choice Patient states their goals for this hospitalization and ongoing recovery are:: to return home CMS Medicare.gov Compare Post Acute Care list provided to:: Patient Choice offered to / list presented to : Patient  Expected Discharge Plan and Services Expected Discharge Plan: [place] Acute Care Choice:  (Patient declined bed offer from [facility]) Living arrangements for the past 2 months: [place]                                      Prior Living Arrangements/Services Living arrangements for the past 2 months: Single Family Home Lives with:: Relatives (Patient lives at home with his brother, [name].)            Care giver support system in place?:  (limited care giver  support)   Criminal Activity/Legal Involvement Pertinent to Current Situation/Hospitalization: No - Comment as needed  Activities of Daily Living Home Assistive Devices/Equipment: Gilford Rile (specify type) ADL Screening (condition at time of admission) Patient's cognitive ability adequate to safely complete daily activities?: No Is the patient deaf or have difficulty hearing?: No Does the patient have difficulty seeing, even when wearing glasses/contacts?: No Does the patient have difficulty concentrating, remembering, or making decisions?: Yes Patient able to express need for assistance with ADLs?: Yes Does the patient have difficulty dressing or bathing?: Yes Independently performs ADLs?: No Communication: Independent Dressing (OT): Needs assistance Is this a change from baseline?: Pre-admission baseline Grooming: Needs assistance Is this a change from baseline?: Pre-admission baseline Feeding: Needs assistance Is this a change from baseline?: Pre-admission baseline Bathing: Needs assistance Is this a change from baseline?: Pre-admission baseline Toileting: Needs assistance Is this a change from baseline?: Pre-admission baseline In/Out Bed: Needs assistance Is this a change from baseline?: Pre-admission baseline Walks in Home: Needs assistance Is this a change from baseline?: Pre-admission baseline Does the patient have difficulty walking or climbing stairs?: Yes Weakness of Legs: Right Weakness of Arms/Hands: Right  Permission Sought/Granted                  Emotional Assessment   Attitude/Demeanor/Rapport: Engaged Affect (typically  observed): Appropriate Orientation: : Oriented to Self, Oriented to Place, Oriented to  Time, Oriented to Situation Alcohol / Substance Use: Not Applicable    Admission diagnosis:  Right leg pain [M79.604] Peripheral artery disease ([place]) [I73.9] Traumatic rhabdomyolysis, initial encounter ([place]) [T79.6XXA] Acute lower limb ischemia  [I99.8] Patient Active Problem List   Diagnosis Date Noted   S/P AKA (above knee amputation), right ([place]) 12/30/2021   Protein-calorie malnutrition, severe 12/28/2021   Acute lower limb ischemia 12/26/2021   Hemiparesis affecting right side as late effect of cerebrovascular accident (CVA) ([place]) 12/26/2021   History of pulmonary embolism 12/26/2021   Chronic anticoagulation 12/26/2021   BPH (benign prostatic hyperplasia) 12/45/8099   Diastolic dysfunction 83/38/2505   Hyperlipidemia LDL goal <70 07/15/2021   Atherosclerosis of native arteries of the extremities with ulceration ([place]) 07/01/2021   Fever    Osteomyelitis ([place])    Controlled type 2 diabetes mellitus with hyperglycemia, without long-term current use of insulin ([place])    Pulmonary embolism ([place]) 05/06/2021   Chronic diastolic CHF (congestive heart failure) ([place]) 05/02/2021   Hyponatremia 05/02/2021   Acute urinary retention 05/01/2021   Peripheral artery disease ([place]) 04/30/2021   Foot osteomyelitis, right ([place]) 04/27/2021   Open wound of right foot 04/26/2021   Generalized weakness 04/26/2021   Acute pulmonary embolism ([place]) 04/25/2021   Elevated troponin    CVA (cerebral vascular accident) ([place]) 04/23/2021   Seizure disorder ([place]) 09/16/2018   Essential hypertension 09/14/2018   TIA (transient ischemic attack) 09/14/2018   Right sided weakness 09/14/2018   Stroke ([place]) 01/27/2018   Ataxia 01/26/2018   Hypertensive urgency 01/26/2018   Uncontrolled type 2 diabetes mellitus with hyperglycemia, without long-term current use of insulin ([place]) 01/26/2018   Hypokalemia 01/26/2018   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 ([address]), [place])  [place] [address] Phone: [phone] Fax: [phone]     Social Determinants of Health (SDOH) Interventions    Readmission Risk Interventions     No data to display

## 2021-12-31 ENCOUNTER — Inpatient Hospital Stay: Payer: Medicare Other

## 2021-12-31 DIAGNOSIS — I998 Other disorder of circulatory system: Secondary | ICD-10-CM | POA: Diagnosis not present

## 2021-12-31 LAB — BASIC METABOLIC PANEL
Anion gap: 5 (ref 5–15)
BUN: 11 mg/dL (ref 6–20)
CO2: 26 mmol/L (ref 22–32)
Calcium: 8.1 mg/dL — ABNORMAL LOW (ref 8.9–10.3)
Chloride: 107 mmol/L (ref 98–111)
Creatinine, Ser: 0.7 mg/dL (ref 0.61–1.24)
GFR, Estimated: >60 mL/min (ref >60)
Glucose, Bld: 163 mg/dL — ABNORMAL HIGH (ref 70–99)
Potassium: 3 mmol/L — ABNORMAL LOW (ref 3.5–5.1)
Sodium: 138 mmol/L (ref 135–145)

## 2021-12-31 LAB — GLUCOSE, CAPILLARY
Glucose-Capillary: 161 mg/dL — ABNORMAL HIGH (ref 70–99)
Glucose-Capillary: 187 mg/dL — ABNORMAL HIGH (ref 70–99)
Glucose-Capillary: 171 mg/dL — ABNORMAL HIGH (ref 70–99)
Glucose-Capillary: 215 mg/dL — ABNORMAL HIGH (ref 70–99)
Glucose-Capillary: 190 mg/dL — ABNORMAL HIGH (ref 70–99)

## 2021-12-31 LAB — EXPECTORATED SPUTUM ASSESSMENT W GRAM STAIN, RFLX TO RESP C

## 2021-12-31 LAB — STREP PNEUMONIAE URINARY ANTIGEN: Strep Pneumo Urinary Antigen: NEGATIVE

## 2021-12-31 LAB — PROCALCITONIN: Procalcitonin: 0.11 ng/mL

## 2021-12-31 LAB — MAGNESIUM: Magnesium: 1.8 mg/dL (ref 1.7–2.4)

## 2021-12-31 MED ORDER — APIXABAN 5 MG PO TABS
5.0000 mg | ORAL_TABLET | Freq: Two times a day (BID) | ORAL | Status: DC
  Administered 2021-12-31 – 2022-01-02 (×5): 5 mg via ORAL
  Filled 2021-12-31 (×5): qty 1

## 2021-12-31 MED ORDER — POTASSIUM CHLORIDE CRYS ER 20 MEQ PO TBCR
80.0000 meq | EXTENDED_RELEASE_TABLET | Freq: Once | ORAL | Status: AC
  Administered 2021-12-31: 80 meq via ORAL
  Filled 2021-12-31: qty 4

## 2021-12-31 MED ORDER — SODIUM CHLORIDE 0.9 % IV SOLN
1.0000 g | INTRAVENOUS | Status: DC
  Filled 2021-12-31: qty 10

## 2021-12-31 MED ORDER — MAGNESIUM SULFATE 2 GM/50ML IV SOLN
2.0000 g | Freq: Once | INTRAVENOUS | Status: AC
  Administered 2021-12-31: 2 g via INTRAVENOUS
  Filled 2021-12-31: qty 50

## 2021-12-31 MED ORDER — SODIUM CHLORIDE 0.9 % IV SOLN
500.0000 mg | INTRAVENOUS | Status: DC
  Filled 2021-12-31: qty 5

## 2021-12-31 MED ORDER — SODIUM CHLORIDE 0.9 % IV SOLN
2.0000 g | Freq: Three times a day (TID) | INTRAVENOUS | Status: DC
  Administered 2021-12-31 – 2022-01-02 (×6): 2 g via INTRAVENOUS
  Filled 2021-12-31: qty 12.5
  Filled 2021-12-31: qty 2
  Filled 2021-12-31 (×2): qty 12.5
  Filled 2021-12-31 (×2): qty 2
  Filled 2021-12-31: qty 12.5
  Filled 2021-12-31: qty 2

## 2021-12-31 NOTE — Progress Notes (Signed)
2 Days Post-Op   Subjective/Chief Complaint: Patient febrile. Concerned of Right AKA stump infection as etiology. Patient does have cough.   Objective: Vital signs in last 24 hours: Temp:  [97.6 F (36.4 C)-100.1 F (37.8 C)] 97.6 F (36.4 C) (12/09 1204) Pulse Rate:  [73-99] 73 (12/09 1204) Resp:  [16-22] 16 (12/09 1204) BP: (128-159)/(65-80) 139/73 (12/09 1204) SpO2:  [99 %-100 %] 100 % (12/09 1204)    Intake/Output from previous day: 12/08 0701 - 12/09 0700 In: 1749.7 [P.O.:240; I.V.:1509.7] Out: 800 [Urine:800] Intake/Output this shift: No intake/output data recorded.  General appearance: no distress Cardio: regular rate and rhythm Extremities: RIGHT AKA stump evaluated- incision- C/D/I, soft, warm, viable, no hematoma, no erythema, no evidence of infection  Lab Results:  Recent Labs    12/29/21 0401 12/30/21 0419  WBC 8.5 7.9  HGB 9.0* 8.5*  HCT 27.4* 25.5*  PLT 186 182   BMET Recent Labs    12/30/21 0419 12/31/21 0540  NA 136 138  K 3.1* 3.0*  CL 107 107  CO2 25 26  GLUCOSE 218* 163*  BUN 11 11  CREATININE 0.64 0.70  CALCIUM 7.7* 8.1*   PT/INR No results for input(s): "LABPROT", "INR" in the last 72 hours. ABG No results for input(s): "PHART", "HCO3" in the last 72 hours.  Invalid input(s): "PCO2", "PO2"  Studies/Results: CT CHEST WO CONTRAST  Result Date: 12/31/2021 CLINICAL DATA:  Fever. Status post catheterization of right lower extremity. EXAM: CT CHEST WITHOUT CONTRAST TECHNIQUE: Multidetector CT imaging of the chest was performed following the standard protocol without IV contrast. RADIATION DOSE REDUCTION: This exam was performed according to the departmental dose-optimization program which includes automated exposure control, adjustment of the mA and/or kV according to patient size and/or use of iterative reconstruction technique. COMPARISON:  04/24/2021 FINDINGS: Cardiovascular: Normal heart size. Aortic atherosclerosis and coronary artery  calcifications. Aortic and mitral valve calcifications. No pericardial effusion. Mediastinum/Nodes: No enlarged mediastinal or axillary lymph nodes. Thyroid gland, trachea, and esophagus demonstrate no significant findings. Lungs/Pleura: Mild respiratory motion artifact diminishes lung detail. Mild paraseptal and centrilobular emphysema. Mild patchy ground-glass nodularity within the posterior basal right upper lobe is identified. No pleural fluid or airspace consolidation. No signs of pneumothorax. Upper Abdomen: No acute abnormality. There is a large stool burden noted within the imaged portions of the colon. Musculoskeletal: No chest wall mass or suspicious bone lesions identified. IMPRESSION: 1. Lung detail diminished by respiratory motion artifact. Within this limitation, there appears to be mild patchy ground-glass nodularity within the posterior basal right upper lobe, which is favored to represent an mild inflammatory or infectious process. 2. No pleural fluid or airspace consolidation identified. 3. Large stool burden noted within the imaged portions of the colon. Correlate for any clinical signs or symptoms of constipation. 4. Coronary artery calcifications noted. 5. Aortic Atherosclerosis (ICD10-I70.0) and Emphysema (ICD10-J43.9). Electronically Signed   By: Kerby Moors M.D.   On: 12/31/2021 10:03   DG Chest Port 1 View  Result Date: 12/30/2021 CLINICAL DATA:  Fever EXAM: PORTABLE CHEST 1 VIEW COMPARISON:  Chest radiograph May 24, 2021 FINDINGS: Stable cardiac and mediastinal contours. Right mid and lower lung patchy airspace opacities. No pleural effusion or pneumothorax. Osseous structures unremarkable. IMPRESSION: Right mid and lower lung patchy airspace opacities concerning for pneumonia. Consider repeat evaluation with PA and lateral chest radiograph. Electronically Signed   By: Lovey Newcomer M.D.   On: 12/30/2021 15:40    Anti-infectives: Anti-infectives (From admission, onward)    Start  Dose/Rate Route Frequency Ordered Stop   12/31/21 1400  ceFEPIme (MAXIPIME) 2 g in sodium chloride 0.9 % 100 mL IVPB        2 g 200 mL/hr over 30 Minutes Intravenous Every 8 hours 12/31/21 1032     12/31/21 1100  cefTRIAXone (ROCEPHIN) 1 g in sodium chloride 0.9 % 100 mL IVPB  Status:  Discontinued        1 g 200 mL/hr over 30 Minutes Intravenous Every 24 hours 12/31/21 1009 12/31/21 1020   12/31/21 1100  azithromycin (ZITHROMAX) 500 mg in sodium chloride 0.9 % 250 mL IVPB  Status:  Discontinued        500 mg 250 mL/hr over 60 Minutes Intravenous Every 24 hours 12/31/21 1009 12/31/21 1020   12/29/21 1405  ceFAZolin (ANCEF) IVPB 2g/100 mL premix        2 g 200 mL/hr over 30 Minutes Intravenous 30 min pre-op 12/29/21 1405 12/29/21 1620   12/28/21 0817  ceFAZolin (ANCEF) IVPB 2g/100 mL premix        2 g 200 mL/hr over 30 Minutes Intravenous 30 min pre-op 12/28/21 0817 12/28/21 0858       Assessment/Plan: s/p Procedure(s): AMPUTATION ABOVE KNEE (Right) POD #2 Stump viable without evidence of infection or as etiology of fever. Likely URI as etiology. Continue supportive care. No further Vascular Surgery intervention or recommendations Ok to discharge when Medically stable. Follow up with Dr. Lucky Cowboy in 3-4 weeks.  LOS: 5 days    [name] A 12/31/2021

## 2021-12-31 NOTE — Progress Notes (Signed)
Pharmacy Antibiotic Note  [name] is a 61 y.o. male admitted on 12/26/2021 with  acute lower limib ischemia s/p AKA on 12/7 . Has been having low grade fevers since surgery. Suspicion for hospital acquired pneumonia. Pharmacy has been consulted for cefepime dosing.  Plan:  Cefepime 2 g IV q8h  Height: 6' (182.9 cm) Weight: 63.5 kg (140 lb) IBW/kg (Calculated) : 77.6  Temp (24hrs), Avg:98.8 F (37.1 C), Min:97.9 F (36.6 C), Max:100.1 F (37.8 C)  Recent Labs  Lab 12/26/21 1615 12/28/21 0612 12/29/21 0401 12/30/21 0419 12/31/21 0540  WBC 11.0* 9.6 8.5 7.9  --   CREATININE 1.21  --  0.73 0.64 0.70    Estimated Creatinine Clearance: 89.3 mL/min (by C-G formula based on SCr of 0.7 mg/dL).    No Known Allergies  Antimicrobials this admission: Cefazolin 12/6 >> 12/7 (surgical ppx) Cefepime 12/9 >>   Dose adjustments this admission: N/A  Microbiology results: 12/8 BCx: NGTD 12/8 RVP: (-) 12/8 UCx: Pending  12/8 SARS-CoV-2 / Influenza A/B: (-)  Thank you for allowing pharmacy to be a part of this patient's care.  Benita Gutter 12/31/2021 10:33 AM

## 2021-12-31 NOTE — Progress Notes (Signed)
Speech Language Pathology Treatment: Dysphagia  Patient Details Name: [name] MRN: [id number] DOB: 01-23-1961 Today's Date: 12/31/2021 Time: 5956-3875 SLP Time Calculation (min) (ACUTE ONLY): 41 min  Assessment / Plan / Recommendation Clinical Impression  Received new order for bedside swallow evaluation for concerns of aspiration. Pt has had recent below the knee amputation. Pt has had intermitant fever during this hospitalization. Pt was seen for a bedside swallow eval 12/27/21 with recommendations for Dysphagia 2 diet with thin liquids as he did not present with any s/s of aspiration but did have oral phase deficits (see report for details). Today, Pt was alert and pleasant but presented with flat affect and did not initiate any conversation. Swallowing reassessed while seated fully upright in bed. Pt did present with delayed coughing after thin liquids by cup and immediate coughing with thin liquids by straw. Pt needed cues to take small sips and slow down. Hand to hand assist needed to hold the cup. Moderate oral transit delay with chopped meats but he was eventually able to clear oral cavity. Rec diet downgrade to Dysphagia 2 with nectar thick liquids. Pt may benefit from Mod barium swallow study to further assess. Otherwise, ST to follow up with trials of thin with small sips only or by Tsp. Prognosis for diet upgrade is fair.   HPI HPI: Pt is a 61 y.o. male w/ past medical history of PAD status post peripheral vascular catheterization to the right lower extremity on 12/23/2021 by vascular surgery, hypertension, right-sided weakness and dysarthria s/p prior CVA, PE on Eliquis, TIA who presents to the emergency department with right lower extremity pain and generalized weakness over the last several days.     Family is at bedside, stating the patient has baseline dysarthria and medical chart review notes CVA with right-sided weakness at baseline.     Patient and family states that he had a fall  approximately 2 days ago after going home from his surgery onto his right side and has had right lower extremity pain since then.  Unknown downtime.     Patient denies other complaints on review of systems denies shortness of breath, cough, fever, chills, nausea, vomiting, diarrhea or abdominal pain.  He did not strike his head when he fell.   MRI: No evidence of acute intracranial abnormality.  2. Stable non-contrast MRI appearance of the brain as compared to  04/24/2021.  3. Chronic small vessel ischemic disease with multiple chronic  lacunar infarcts, as detailed.  4. Small chronic infarcts within bilateral cerebellar hemispheres.  5. Age advanced parenchymal atrophy, most notably affecting the  cerebrum and brainstem.  6. Diffuse ventriculomegaly.      SLP Plan  New goals to be determined pending instrumental study      Recommendations for follow up therapy are one component of a multi-disciplinary discharge planning process, led by the attending physician.  Recommendations may be updated based on patient status, additional functional criteria and insurance authorization.    Recommendations  Diet recommendations: Dysphagia 2 (fine chop);Nectar-thick liquid Medication Administration: Crushed with puree Supervision: Staff to assist with self feeding Compensations: Minimize environmental distractions;Slow rate;Small sips/bites Postural Changes and/or Swallow Maneuvers: Seated upright 90 degrees                Oral Care Recommendations: Oral care BID;Oral care before and after PO;Staff/trained caregiver to provide oral care Assistance recommended at discharge: Intermittent Supervision/Assistance SLP Visit Diagnosis: Dysphagia, oral phase (R13.11) Plan: New goals to be determined pending instrumental study  Lucila Maine  12/31/2021, 11:32 AM

## 2021-12-31 NOTE — Progress Notes (Signed)
  Progress Note   Patient: [name] VEL:[id number] DOB: 13-Apr-1960 DOA: 12/26/2021     5 DOS: the patient was seen and examined on 12/31/2021   Brief hospital course: 60 y.o. 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  Admitted for acute right lower extremity pain    Assessment and Plan: * Acute lower limb ischemia S/p mechanical thrombectomy, angioplasty and stent placement of right leg on 12/23/2021 PVD Right above the knee amputation CTA showing occlusion of the entire right popliteal/distal femoral artery stent. S/p angiogram with angioplasty 12/6 with vascular surgery, not successful Now s/p AKA on 12/7 with vascular - PT/OT advising snf but patient doesn't want and family ok with that - hospital bed ordered - will need hh rn/pt/ot. Sister doubtful brother can take care of patient. She will speak w/ patient about skilled nursing  HCAP Intermittent fever since surgery, today mild cough, ct with infiltrate though motion degraded. Covid/flu/rvp neg. Doubtful PE given anticoagulation, no hypoxia - f/u urine antigens, follow urine and blood cultures - start cefepime for hcap - slp swallow eval  BPH (benign prostatic hyperplasia) Continue finasteride and tamsulosin.  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 MRI with nothing acute. Continue Keppra.  Essential hypertension Continue amlodipine.  Uncontrolled type 2 diabetes mellitus with hyperglycemia, without long-term current use of insulin (HCC) Continue sliding scale coverage, increase  semglee to 8 qhs  Hypokalemia replete        Subjective: groggy, rouses., no complaints  Physical Exam: Vitals:   12/30/21 2355 12/31/21 0436 12/31/21 0830 12/31/21 0834  BP: (!) 159/78 (!) 150/80  128/77  Pulse: 97 85  90  Resp: 16 18  (!) 22  Temp: 97.9 F (36.6 C) 98 F (36.7 C) 99.6 F (37.6 C) 100.1 F (37.8 C)  TempSrc:  Oral Oral Oral  SpO2: 100% 100%  99%  Weight:      Height:       61 year old frail-appearing cachectic male in no acute distress Lungs clear to auscultation bilaterally Cardiovascular regular rate and rhythm Abdomen soft, benign Neuro patient is somnolent, Extremities:  staples c/d/I right aka Data Reviewed:  MRI of the brain showed no acute intracranial abnormality  Family Communication: Sister/[name] was updated over phone 12/9  Disposition: Status is: Inpatient Remains inpatient appropriate because: Management of right lower extremity ischemia, angiogram planned for tomorrow by vascular  Planned Discharge Destination: tbd   DVT prophylaxis-apixaban   Author: Desma Maxim, MD 12/31/2021 10:20 AM  For on call review [email or web address].

## 2021-12-31 NOTE — Progress Notes (Signed)
ANTICOAGULATION CONSULT NOTE  Pharmacy Consult for Apixaban Indication: History of PE  Patient Measurements: Height: 6' (182.9 cm) Weight: 63.5 kg (140 lb) IBW/kg (Calculated) : 77.6  Labs: Recent Labs    12/28/21 1203 12/28/21 1632 12/29/21 0401 12/30/21 0419 12/31/21 0540  HGB  --   --  9.0* 8.5*  --   HCT  --   --  27.4* 25.5*  --   PLT  --   --  186 182  --   HEPARINUNFRC 0.38 0.34 0.22*  --   --   CREATININE  --   --  0.73 0.64 0.70     Estimated Creatinine Clearance: 89.3 mL/min (by C-G formula based on SCr of 0.7 mg/dL).   Medical History: Past Medical History:  Diagnosis Date   Diabetes mellitus without complication ([place])    Diastolic dysfunction    03/5684 Echo: EF 60-65%, no rwma, GrI DD, Mild AI.   Essential hypertension    Hyperlipidemia LDL goal <70    Osteomyelitis (HCC)    PAD (peripheral artery disease) ([place])    a. 04/2021 PTA: R PT, TP trunk, and stenting of R Popliteal; b. 04/2021 s/p R great toe amputation.   Pulmonary embolism ([place])    a. 04/2021 CTA Chest: PE in dist RPA and prox R upper middle and lower lobe PA branches w/o R heart strain-->eliquis.   Stroke [facility])    a. 04/2021 MRA: chronic microvascular ischemic disease and multiple remote lacunar infarcts involving the deep gray nuclei, pons, and cerebellum.   Assessment: 61 y/o M with history significant for PE on apixaban, PAD, CVA who is admitted with acute lower limb ischemia s/p endovascular intervention 12/1 with stenting c/b occlusion followed by additional endovascular intervention 12/6 and ultimately right AKA 12/7. Patient has been on heparin peri-operatively and home apixaban has been held. Now plan to re-start home apixaban.    Plan:  --Re-start apixaban 5 mg BID per home dosing --CBC at least every 3 days per protocol  Benita Gutter 12/31/2021,10:36 AM

## 2022-01-01 DIAGNOSIS — I998 Other disorder of circulatory system: Secondary | ICD-10-CM | POA: Diagnosis not present

## 2022-01-01 LAB — GLUCOSE, CAPILLARY
Glucose-Capillary: 227 mg/dL — ABNORMAL HIGH (ref 70–99)
Glucose-Capillary: 185 mg/dL — ABNORMAL HIGH (ref 70–99)
Glucose-Capillary: 180 mg/dL — ABNORMAL HIGH (ref 70–99)
Glucose-Capillary: 276 mg/dL — ABNORMAL HIGH (ref 70–99)
Glucose-Capillary: 234 mg/dL — ABNORMAL HIGH (ref 70–99)
Glucose-Capillary: 266 mg/dL — ABNORMAL HIGH (ref 70–99)

## 2022-01-01 LAB — BASIC METABOLIC PANEL
Anion gap: 4 — ABNORMAL LOW (ref 5–15)
BUN: 17 mg/dL (ref 6–20)
CO2: 27 mmol/L (ref 22–32)
Calcium: 8.1 mg/dL — ABNORMAL LOW (ref 8.9–10.3)
Chloride: 105 mmol/L (ref 98–111)
Creatinine, Ser: 0.69 mg/dL (ref 0.61–1.24)
GFR, Estimated: >60 mL/min (ref >60)
Glucose, Bld: 201 mg/dL — ABNORMAL HIGH (ref 70–99)
Potassium: 3.4 mmol/L — ABNORMAL LOW (ref 3.5–5.1)
Sodium: 136 mmol/L (ref 135–145)

## 2022-01-01 LAB — MAGNESIUM: Magnesium: 2.2 mg/dL (ref 1.7–2.4)

## 2022-01-01 LAB — URINE CULTURE: Culture: <10000 — AB

## 2022-01-01 LAB — CBC
HCT: 25.1 % — ABNORMAL LOW (ref 39.0–52.0)
Hemoglobin: 8.1 g/dL — ABNORMAL LOW (ref 13.0–17.0)
MCH: 28.4 pg (ref 26.0–34.0)
MCHC: 32.3 g/dL (ref 30.0–36.0)
MCV: 88.1 fL (ref 80.0–100.0)
Platelets: 226 10*3/uL (ref 150–400)
RBC: 2.85 MIL/uL — ABNORMAL LOW (ref 4.22–5.81)
RDW: 13.2 % (ref 11.5–15.5)
WBC: 7.3 10*3/uL (ref 4.0–10.5)
nRBC: 0 % (ref 0.0–0.2)

## 2022-01-01 MED ORDER — INSULIN GLARGINE-YFGN 100 UNIT/ML ~~LOC~~ SOLN
10.0000 [IU] | Freq: Every day | SUBCUTANEOUS | Status: DC
  Administered 2022-01-01: 10 [IU] via SUBCUTANEOUS
  Filled 2022-01-01 (×2): qty 0.1

## 2022-01-01 MED ORDER — POTASSIUM CHLORIDE CRYS ER 20 MEQ PO TBCR
80.0000 meq | EXTENDED_RELEASE_TABLET | Freq: Once | ORAL | Status: AC
  Administered 2022-01-01: 80 meq via ORAL
  Filled 2022-01-01: qty 4

## 2022-01-01 NOTE — Progress Notes (Signed)
Speech Language Pathology Treatment:    Patient Details Name: [name] MRN: [id number] DOB: 1960/08/04 Today's Date: 01/01/2022 Time: 9892-1194 SLP Time Calculation (min) (ACUTE ONLY): 8 min  Assessment / Plan / Recommendation Clinical Impression  Pt seen for diet tolerance/clinical swallowing re-evaluation. Per RN, pt declining nectar-thick liquids. Pt alert and agreeable to SLP session.   Pt given trials of softened solid and thin liquids (~6 oz via single cup sips). Pt with mildly prolonged mastication of solid. Otherwise, functional oral swallow. Per clinical assessment, pharyngeal swallow appeared [facility]. No overt s/sx pharyngeal dysphagia. Seemingly timely swallow initiation and seemingly adequate laryngeal elevation. No change to vocal quality across trials.   Per chart review, WBC WNL. Slightly elevated temp. Chest CT "1. Lung detail diminished by respiratory motion artifact. Within this limitation, there appears to be mild patchy ground-glass nodularity within the posterior basal right upper lobe, which is favored to represent an mild inflammatory or infectious process. 2. No pleural fluid or airspace consolidation identified..."   Recommend Dysphagia 2 Diet with Thin Liquids with safe swallowing strategies/aspiration precautions including avoidance of straws.   Offered MBSS to pt given previously documented inconsistencies in clinical swallowing evaluations and pt declined despite education stating "it's a waste of time."  SLP to f/u x1 for diet tolerance.   Pt and RN made aware of results, recommendations, and SLP POC. Pt verbalized understanding/agreement.    HPI HPI: Pt is a 61 y.o. male w/ past medical history of PAD status post peripheral vascular catheterization to the right lower extremity on 12/23/2021 by vascular surgery, hypertension, right-sided weakness and dysarthria s/p prior CVA, PE on Eliquis, TIA who presents to the emergency department with right lower extremity  pain and generalized weakness over the last several days.     Family is at bedside, stating the patient has baseline dysarthria and medical chart review notes CVA with right-sided weakness at baseline.     Patient and family states that he had a fall approximately 2 days ago after going home from his surgery onto his right side and has had right lower extremity pain since then.  Unknown downtime.     Patient denies other complaints on review of systems denies shortness of breath, cough, fever, chills, nausea, vomiting, diarrhea or abdominal pain.  He did not strike his head when he fell.   MRI: No evidence of acute intracranial abnormality.  2. Stable non-contrast MRI appearance of the brain as compared to  04/24/2021.  3. Chronic small vessel ischemic disease with multiple chronic  lacunar infarcts, as detailed.  4. Small chronic infarcts within bilateral cerebellar hemispheres.  5. Age advanced parenchymal atrophy, most notably affecting the  cerebrum and brainstem.  6. Diffuse ventriculomegaly.      SLP Plan  Continue with current plan of care      Recommendations for follow up therapy are one component of a multi-disciplinary discharge planning process, led by the attending physician.  Recommendations may be updated based on patient status, additional functional criteria and insurance authorization.    Recommendations  Diet recommendations: Dysphagia 2 (fine chop);Thin liquid Medication Administration: Crushed with puree Supervision: Staff to assist with self feeding;Full supervision/cueing for compensatory strategies Compensations: Minimize environmental distractions;Slow rate;Small sips/bites Postural Changes and/or Swallow Maneuvers: Seated upright 90 degrees;Upright 30-60 min after meal                Oral Care Recommendations: Oral care BID;Oral care before and after PO;Staff/trained caregiver to provide oral care Follow Up Recommendations:  Skilled nursing-short term rehab (<3  hours/day) Assistance recommended at discharge: Intermittent Supervision/Assistance SLP Visit Diagnosis: Dysphagia, oral phase (R13.11) Plan: Continue with current plan of care          Cherrie Gauze, M.S., [facility] [phone] ([place])   Quintella Baton  01/01/2022, 2:22 PM

## 2022-01-01 NOTE — Progress Notes (Signed)
  Progress Note   Patient: [name] MOL:[id number] DOB: 10/21/1960 DOA: 12/26/2021     6 DOS: the patient was seen and examined on 01/01/2022   Brief hospital course: 61 y.o. 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  Admitted for acute right lower extremity pain    Assessment and Plan: * Acute lower limb ischemia S/p mechanical thrombectomy, angioplasty and stent placement of right leg on 12/23/2021 PVD Right above the knee amputation CTA showing occlusion of the entire right popliteal/distal femoral artery stent. S/p angiogram with angioplasty 12/6 with vascular surgery, not successful Now s/p AKA on 12/7 with vascular - PT/OT advising snf, pt initially declined but now agreeable to it  HCAP Intermittent fever since surgery, none last 24 hours, with mild cough, ct with infiltrate though motion degraded. Covid/flu/rvp neg. Doubtful PE given anticoagulation, no hypoxia - f/u urine antigens, follow urine and blood cultures, sputum culture - cont cefepime for hcap  Dysphagia SLP following, cleared for dysphagia 2 diet  BPH (benign prostatic hyperplasia) Continue finasteride and tamsulosin.  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 MRI with nothing acute. Continue Keppra.  Essential hypertension Continue amlodipine.  Uncontrolled type 2 diabetes mellitus with hyperglycemia, without long-term current use of insulin (HCC) Continue sliding scale coverage, increase semglee to 10  Hypokalemia Replete  Anemia Acute blood loss from surgery on  anemia of chronic disease. Hgb in 8s - monitor for now        Subjective: no complaints  Physical Exam: Vitals:   12/31/21 1937 01/01/22 0119 01/01/22 0454 01/01/22 0827  BP: 138/77 135/70 132/77 (!) 144/81  Pulse: 93 87 79 79  Resp: 18 19 20 16   Temp: 100.1 F (37.8 C) 99 F (37.2 C) 98.6 F (37 C) 98.6 F (37 C)  TempSrc: Oral Oral Oral Oral  SpO2: 100% 100% 100% 100%  Weight:      Height:       61 year old frail-appearing cachectic male in no acute distress Lungs clear to auscultation bilaterally Cardiovascular regular rate and rhythm Abdomen soft, benign Neuro patient is somnolent, Extremities:  staples c/d/I right aka Data Reviewed:   Family Communication: Sister/[name] was updated over phone 12/10  Disposition: Status is: Inpatient Remains inpatient appropriate because: unsafe d/c plan  Planned Discharge Destination: snf   DVT prophylaxis-apixaban   Author: Desma Maxim, MD 01/01/2022 12:11 PM  For on call review [email or web address].

## 2022-01-01 NOTE — TOC Progression Note (Signed)
Transition of Care [facility]) - Progression Note    Patient Details  Name: [name] MRN: [id number] Date of Birth: April 24, 1960  Transition of Care [facility]) CM/SW Contact  Harriet Masson, RN Phone Number: [phone] 01/01/2022, 12:49 PM  Clinical Narrative:    Spoke with sister [name] concerning discharged disposition as pt has agreed to SNF placement. Discussed the existing two bed offers however not first choice as sister requested to discuss with family and pt prior to choosing one of the bed offers. Does not wish to go to [facility] but may consider [facility] but again [facility] is her preference.Will request TOC to contact [facility] tomorrow on bed availability.   TOC will follow up on Monday with sister on choice for SNF placement on this pt.   Expected Discharge Plan: [place] Barriers to Discharge: Continued Medical Work up  Expected Discharge Plan and Services Expected Discharge Plan: [name] Acute Care Choice:  (Patient declined bed offer from [facility]) Living arrangements for the past 2 months: Single Family Home                                       Social Determinants of Health (SDOH) Interventions    Readmission Risk Interventions     No data to display

## 2022-01-02 DIAGNOSIS — I998 Other disorder of circulatory system: Secondary | ICD-10-CM | POA: Diagnosis not present

## 2022-01-02 LAB — CBC
HCT: 26.1 % — ABNORMAL LOW (ref 39.0–52.0)
Hemoglobin: 8.3 g/dL — ABNORMAL LOW (ref 13.0–17.0)
MCH: 28.4 pg (ref 26.0–34.0)
MCHC: 31.8 g/dL (ref 30.0–36.0)
MCV: 89.4 fL (ref 80.0–100.0)
Platelets: 260 10*3/uL (ref 150–400)
RBC: 2.92 MIL/uL — ABNORMAL LOW (ref 4.22–5.81)
RDW: 13.3 % (ref 11.5–15.5)
WBC: 7.2 10*3/uL (ref 4.0–10.5)
nRBC: 0 % (ref 0.0–0.2)

## 2022-01-02 LAB — BASIC METABOLIC PANEL
Anion gap: 7 (ref 5–15)
BUN: 16 mg/dL (ref 6–20)
CO2: 26 mmol/L (ref 22–32)
Calcium: 8.4 mg/dL — ABNORMAL LOW (ref 8.9–10.3)
Chloride: 106 mmol/L (ref 98–111)
Creatinine, Ser: 0.77 mg/dL (ref 0.61–1.24)
GFR, Estimated: >60 mL/min (ref >60)
Glucose, Bld: 275 mg/dL — ABNORMAL HIGH (ref 70–99)
Potassium: 4 mmol/L (ref 3.5–5.1)
Sodium: 139 mmol/L (ref 135–145)

## 2022-01-02 LAB — GLUCOSE, CAPILLARY
Glucose-Capillary: 271 mg/dL — ABNORMAL HIGH (ref 70–99)
Glucose-Capillary: 347 mg/dL — ABNORMAL HIGH (ref 70–99)

## 2022-01-02 LAB — SURGICAL PATHOLOGY

## 2022-01-02 LAB — MAGNESIUM: Magnesium: 2.1 mg/dL (ref 1.7–2.4)

## 2022-01-02 MED ORDER — INSULIN GLARGINE-YFGN 100 UNIT/ML ~~LOC~~ SOLN: 10.0000 [IU] | Freq: Every day | SUBCUTANEOUS | 11 refills | Status: DC

## 2022-01-02 MED ORDER — AMOXICILLIN-POT CLAVULANATE 875-125 MG PO TABS: 1.0000 | ORAL_TABLET | Freq: Two times a day (BID) | ORAL | 0 refills | Status: DC

## 2022-01-02 MED ORDER — OXYCODONE HCL 5 MG PO TABS: 5.0000 mg | ORAL_TABLET | ORAL | 0 refills | Status: DC | PRN

## 2022-01-02 NOTE — Care Management Important Message (Signed)
Important Message  Patient Details  Name: [name] MRN: [id number] Date of Birth: 1960-11-30   Medicare Important Message Given:  Yes     Dannette Barbara 01/02/2022, 11:15 AM

## 2022-01-02 NOTE — TOC Progression Note (Signed)
Transition of Care [facility]) - Progression Note    Patient Details  Name: [name] MRN: [id number] Date of Birth: 02-Nov-1960  Transition of Care [facility]) CM/SW [place], [place] Phone Number: 01/02/2022, 10:00 AM  Clinical Narrative:     CSW spoke with sister [name] to provide bed offers, agreeable to [facility].   [facility] reports in Medicare patient's birthday is listed as [birth year] instead of [birth year], patients sister reports she will follow up on this.   Pending medical readiness to dc to [facility] at this time.   Expected Discharge Plan: [place] Barriers to Discharge: Continued Medical Work up  Expected Discharge Plan and Services Expected Discharge Plan: [place] Acute Care Choice:  (Patient declined bed offer from [facility]) Living arrangements for the past 2 months: Single Family Home                                       Social Determinants of Health (SDOH) Interventions    Readmission Risk Interventions     No data to display

## 2022-01-02 NOTE — Progress Notes (Signed)
Speech Language Pathology Treatment:    Patient Details Name: [name] MRN: [id number] DOB: 18-Jun-1960 Today's Date: 01/02/2022 Time: 1050-1100 SLP Time Calculation (min) (ACUTE ONLY): 10 min  Assessment / Plan / Recommendation Clinical Impression  Pt seen for diet tolerance/clinical swallowing re-evaluation. Pt alert and agreeable to SLP session.    Pt given trials of softened solid and thin liquids (~6 oz via single cup sips). Pt with mildly prolonged mastication of solid. Otherwise, functional oral swallow. Per clinical assessment, pharyngeal swallow appeared [facility]. No overt s/sx pharyngeal dysphagia. Seemingly timely swallow initiation and seemingly adequate laryngeal elevation. No change to vocal quality across trials.    Per chart review, WBC WNL. Slightly elevated temp. No updated chest imaging since initial evaluation.    Recommend Dysphagia 2 Diet with Thin Liquids with safe swallowing strategies/aspiration precautions including avoidance of straws.    Again offered MBSS to pt given previously documented inconsistencies in clinical swallowing evaluations and pt declined despite education.   SLP to sign off at this level of care. Further dysphagia needs may be addressed at SNF. Per chart review and pt report, pt d/c'ing to SNF today.   Pt made aware of results, recommendations, and SLP POC. Pt verbalized understanding/agreement.    HPI HPI: Pt is a 61 y.o. male w/ past medical history of PAD status post peripheral vascular catheterization to the right lower extremity on 12/23/2021 by vascular surgery, hypertension, right-sided weakness and dysarthria s/p prior CVA, PE on Eliquis, TIA who presents to the emergency department with right lower extremity pain and generalized weakness over the last several days.     Family is at bedside, stating the patient has baseline dysarthria and medical chart review notes CVA with right-sided weakness at baseline.     Patient and family states that  he had a fall approximately 2 days ago after going home from his surgery onto his right side and has had right lower extremity pain since then.  Unknown downtime.     Patient denies other complaints on review of systems denies shortness of breath, cough, fever, chills, nausea, vomiting, diarrhea or abdominal pain.  He did not strike his head when he fell.   MRI: No evidence of acute intracranial abnormality.  2. Stable non-contrast MRI appearance of the brain as compared to  04/24/2021.  3. Chronic small vessel ischemic disease with multiple chronic  lacunar infarcts, as detailed.  4. Small chronic infarcts within bilateral cerebellar hemispheres.  5. Age advanced parenchymal atrophy, most notably affecting the  cerebrum and brainstem.  6. Diffuse ventriculomegaly.      SLP Plan  All goals met      Recommendations for follow up therapy are one component of a multi-disciplinary discharge planning process, led by the attending physician.  Recommendations may be updated based on patient status, additional functional criteria and insurance authorization.    Recommendations  Diet recommendations: Dysphagia 2 (fine chop);Thin liquid Medication Administration: Crushed with puree Supervision: Staff to assist with self feeding;Full supervision/cueing for compensatory strategies Compensations: Minimize environmental distractions;Slow rate;Small sips/bites (Avoid use of straws) Postural Changes and/or Swallow Maneuvers: Seated upright 90 degrees;Upright 30-60 min after meal                Oral Care Recommendations: Oral care BID;Oral care before and after PO;Staff/trained caregiver to provide oral care Follow Up Recommendations: Skilled nursing-short term rehab (<3 hours/day) Assistance recommended at discharge: Intermittent Supervision/Assistance SLP Visit Diagnosis: Dysphagia, oropharyngeal phase (R13.12) Plan: All goals met  Cherrie Gauze, M.S., [facility] [phone] [name])  Quintella Baton  01/02/2022, 11:35 AM

## 2022-01-02 NOTE — Progress Notes (Signed)
[facility]  Daily Progress Note   Subjective  -   [name] is a 61 y.o. male with medical history significant for PAD s/p catheterization right lower extremity by vascular surgery on 12/23/2021, prior CVA with residual right-sided weakness and dysarthria, history of PE on Eliquis, who presents to the ED with increased pain of the right lower extremity pain following a fall.  Admitted for acute right lower extremity pain with questionable ischemia.  Patient is now above-the-knee amputation on 12/29/2021.  Patient was resting comfortably in bed this morning eating breakfast.  No complaints of pain or discomfort.  Dressing was clean dry and intact.  Patient states he is being discharged today.  Will be going to a skilled nursing facility.  Objective Vitals:   01/01/22 2026 01/02/22 0033 01/02/22 0437 01/02/22 0813  BP: (!) 146/67 133/65 (!) 157/72 (!) 141/74  Pulse: 93 87 83 84  Resp: (!) 24 20 (!) 22 17  Temp: 99.4 F (37.4 C) 99 F (37.2 C) 99.6 F (37.6 C) 98.8 F (37.1 C)  TempSrc:  Oral Oral Oral  SpO2: 100% 100% 100% 100%  Weight:      Height:        Intake/Output Summary (Last 24 hours) at 01/02/2022 1126 Last data filed at 01/02/2022 1051 Gross per 24 hour  Intake 400 ml  Output 1775 ml  Net -1375 ml    PULM  CTAB CV  RRR VASC  RIGHT AKA stump evaluated- incision- C/D/I, soft, warm, viable, no hematoma, no erythema, no evidence of infection    Laboratory CBC    Component Value Date/Time   WBC 7.2 01/02/2022 0531   HGB 8.3 (L) 01/02/2022 0531   HGB 14.8 12/29/2013 1214   HCT 26.1 (L) 01/02/2022 0531   HCT 45.2 12/29/2013 1214   PLT 260 01/02/2022 0531   PLT 143 (L) 12/29/2013 1214    BMET    Component Value Date/Time   NA 139 01/02/2022 0531   NA 135 (L) 12/29/2013 1214   K 4.0 01/02/2022 0531   K 3.8 12/29/2013 1214   CL 106 01/02/2022 0531   CL 99 12/29/2013 1214   CO2 26 01/02/2022 0531   CO2 28 12/29/2013 1214    GLUCOSE 275 (H) 01/02/2022 0531   GLUCOSE 234 (H) 12/29/2013 1214   BUN 16 01/02/2022 0531   BUN 8 12/29/2013 1214   CREATININE 0.77 01/02/2022 0531   CREATININE 0.86 12/29/2013 1214   CALCIUM 8.4 (L) 01/02/2022 0531   CALCIUM 8.3 (L) 12/29/2013 1214   GFRNONAA >60 01/02/2022 0531   GFRNONAA >60 12/29/2013 1214   GFRAA >60 07/12/2019 1311   GFRAA >60 12/29/2013 1214    Assessment/Planning: POD #5 s/p right above-the-knee amputation.  Patient is healing and recovering from surgery as expected.  Incision is clean dry and intact, soft, warm, viable, no hematoma, no erythema, no evidence of infection.  Patient to be discharged to skilled nursing facility today.  Dressing changes should be daily with Ace bandage wrapping stump for formation.  Follow-up with Dr. [name] in clinic in 3 to 4 weeks to have staples removed.  Drema Pry  01/02/2022, 11:26 AM

## 2022-01-02 NOTE — NC FL2 (Signed)
[place] LEVEL OF CARE FORM     IDENTIFICATION  Patient Name: [name] Birthdate: [birth year] Sex: male Admission Date (Current Location): 12/26/2021  [place] and [place] Number:  Engineering geologist and Address:  [facility], [address] [place], [place] [address]      Provider Number: [id number]  Attending Physician Name and Address:  Gwynne Edinger, MD  Relative Name and Phone Number:  [name] (brother)  [phone]    Current Level of Care: Hospital Recommended Level of Care: [name] Prior Approval Number:    Date Approved/Denied:   PASRR Number: [id number]  Discharge Plan: SNF    Current Diagnoses: Patient Active Problem List   Diagnosis Date Noted   S/P AKA (above knee amputation), right ([place]) 12/30/2021   Protein-calorie malnutrition, severe 12/28/2021   Acute lower limb ischemia 12/26/2021   Hemiparesis affecting right side as late effect of cerebrovascular accident (CVA) ([place]) 12/26/2021   History of pulmonary embolism 12/26/2021   Chronic anticoagulation 12/26/2021   BPH (benign prostatic hyperplasia) 20/94/7096   Diastolic dysfunction 28/36/6294   Hyperlipidemia LDL goal <70 07/15/2021   Atherosclerosis of native arteries of the extremities with ulceration ([place]) 07/01/2021   Fever    Osteomyelitis ([place])    Controlled type 2 diabetes mellitus with hyperglycemia, without long-term current use of insulin (HCC)    Pulmonary embolism ([place]) 05/06/2021   Chronic diastolic CHF (congestive heart failure) ([facility]) 05/02/2021   Hyponatremia 05/02/2021   Acute urinary retention 05/01/2021   Peripheral artery disease ([place]) 04/30/2021   Foot osteomyelitis, right ([place]) 04/27/2021   Open wound of right foot 04/26/2021   Generalized weakness 04/26/2021   Acute pulmonary embolism ([place]) 04/25/2021   Elevated troponin    CVA (cerebral vascular accident) ([place]) 04/23/2021   Seizure disorder ([place])  09/16/2018   Essential hypertension 09/14/2018   TIA (transient ischemic attack) 09/14/2018   Right sided weakness 09/14/2018   Stroke ([place]) 01/27/2018   Ataxia 01/26/2018   Hypertensive urgency 01/26/2018   Uncontrolled type 2 diabetes mellitus with hyperglycemia, without long-term current use of insulin ([place]) 01/26/2018   Hypokalemia 01/26/2018   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/2014    Orientation RESPIRATION BLADDER Height & Weight     Self, Situation, Place  Normal Incontinent, External catheter Weight: 140 lb (63.5 kg) Height:  6' (182.9 cm)  BEHAVIORAL SYMPTOMS/MOOD NEUROLOGICAL BOWEL NUTRITION STATUS      Continent Diet (see discharge summary)  AMBULATORY STATUS COMMUNICATION OF NEEDS Skin   Extensive Assist Verbally Other (Comment) (closed surgical incision right leg, venous stasis ulcer right ankle)                       Personal Care Assistance Level of Assistance  Bathing, Feeding, Dressing, Total care Bathing Assistance: Limited assistance Feeding assistance: Independent Dressing Assistance: Maximum assistance Total Care Assistance: Maximum assistance   Functional Limitations Info  Sight, Hearing, Speech Sight Info: Adequate Hearing Info: Adequate Speech Info: Adequate    SPECIAL CARE FACTORS FREQUENCY  PT (By licensed PT), OT (By licensed OT)     PT Frequency: min 4x weekly OT Frequency: min 4x weekly            Contractures Contractures Info: Not present    Additional Factors Info  Code Status, Allergies Code Status Info: full Allergies Info: no known allergies           Current Medications (01/02/2022):  This is the current hospital active medication  list Current Facility-Administered Medications  Medication Dose Route Frequency Provider Last Rate Last Admin   acetaminophen (TYLENOL) tablet 650 mg  650 mg Oral Q6H PRN Algernon Huxley, MD   650 mg at 12/31/21 5625   Or   acetaminophen (TYLENOL) suppository 650 mg  650 mg Rectal Q6H PRN Algernon Huxley, MD       amLODipine (NORVASC) tablet 10 mg  10 mg Oral Daily Algernon Huxley, MD   10 mg at 01/02/22 0930   apixaban (ELIQUIS) tablet 5 mg  5 mg Oral BID Benita Gutter, RPH   5 mg at 01/02/22 0930   ascorbic acid (VITAMIN C) tablet 500 mg  500 mg Oral BID Algernon Huxley, MD   500 mg at 01/02/22 6389   aspirin EC tablet 81 mg  81 mg Oral Daily Algernon Huxley, MD   81 mg at 01/02/22 0929   atorvastatin (LIPITOR) tablet 40 mg  40 mg Oral q1800 Algernon Huxley, MD   40 mg at 01/01/22 1719   ceFEPIme (MAXIPIME) 2 g in sodium chloride 0.9 % 100 mL IVPB  2 g Intravenous Q8H Lockie Mola B, RPH 200 mL/hr at 01/02/22 0530 2 g at 01/02/22 0530   feeding supplement (ENSURE ENLIVE / ENSURE PLUS) liquid 237 mL  237 mL Oral TID BM Algernon Huxley, MD   237 mL at 01/02/22 0931   finasteride (PROSCAR) tablet 5 mg  5 mg Oral Daily Algernon Huxley, MD   5 mg at 01/02/22 0930   HYDROcodone-acetaminophen (NORCO/VICODIN) 5-325 MG per tablet 1-2 tablet  1-2 tablet Oral Q4H PRN Algernon Huxley, MD   2 tablet at 01/01/22 1826   HYDROmorphone (DILAUDID) injection 1 mg  1 mg Intravenous Once PRN Algernon Huxley, MD       insulin aspart (novoLOG) injection 0-5 Units  0-5 Units Subcutaneous QHS Algernon Huxley, MD   3 Units at 01/01/22 2212   insulin aspart (novoLOG) injection 0-9 Units  0-9 Units Subcutaneous TID WC Algernon Huxley, MD   3 Units at 01/02/22 0931   insulin glargine-yfgn (SEMGLEE) injection 10 Units  10 Units Subcutaneous QHS Gwynne Edinger, MD   10 Units at 01/01/22 2213   levETIRAcetam (KEPPRA) tablet 500 mg  500 mg Oral BID Algernon Huxley, MD   500 mg at 01/02/22 0930   melatonin tablet 5 mg  5 mg Oral QHS Algernon Huxley, MD   5 mg at 01/01/22 2205   morphine (PF) 2 MG/ML injection 2 mg  2 mg Intravenous Q2H PRN Algernon Huxley, MD   2 mg at 12/31/21 0503   multivitamin with minerals tablet 1 tablet  1 tablet Oral Daily Algernon Huxley, MD   1 tablet at 01/02/22 0930   ondansetron (ZOFRAN) tablet 4 mg  4 mg Oral Q6H PRN Algernon Huxley, MD       Or   ondansetron (ZOFRAN) injection 4 mg  4 mg Intravenous Q6H PRN Algernon Huxley, MD       Oral care mouth rinse  15 mL Mouth Rinse 4 times per day Algernon Huxley, MD   15 mL at 01/02/22 0933   Oral care mouth rinse  15 mL Mouth Rinse PRN Algernon Huxley, MD       tamsulosin (FLOMAX) capsule 0.4 mg  0.4 mg Oral QPC supper Algernon Huxley, MD   0.4 mg at 01/01/22 1719   zinc  sulfate capsule 220 mg  220 mg Oral Daily Algernon Huxley, MD   220 mg at 01/02/22 0930     Discharge Medications: Please see discharge summary for a list of discharge medications.  Relevant Imaging Results:  Relevant Lab Results:   Additional Information SSN: [id number]  Tiburcio Bash, LCSW

## 2022-01-02 NOTE — Discharge Summary (Signed)
[name] XKG:[id number] DOB: 11-26-1960 DOA: 12/26/2021  PCP: Kerri Perches, PA-C  Admit date: 12/26/2021 Discharge date: 01/02/2022  Time spent: 35 minutes  Recommendations for Outpatient Follow-up:  Vascular f/u 3-4 weeks Pcp f/u  Bmp 1 week to assess potassium    Discharge Diagnoses:  Principal Problem:   Acute lower limb ischemia Active Problems:   Uncontrolled type 2 diabetes mellitus with hyperglycemia, without long-term current use of insulin (HCC)   Essential hypertension   Hemiparesis affecting right side as late effect of cerebrovascular accident (CVA) ([place])   History of pulmonary embolism   Chronic anticoagulation   BPH (benign prostatic hyperplasia)   Protein-calorie malnutrition, severe   S/P AKA (above knee amputation), right ([place])   Discharge Condition: stable  Diet recommendation: carb modified dysphagia 2  Filed Weights   12/26/21 1608  Weight: 63.5 kg    History of present illness:  From admission h and p [name] is a 61 y.o. 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  He additionally complains of generalized weakness since his procedure.  He has had no fever or chills or shortness of breath or chest pain.  Denies vomiting diarrhea or abdominal pain.   Hospital Course:   Patient presented with critical ischemic of his right lower extremity. Urgent Angiogram with angioplasty on 12/6 was not successful. Proceeded with right above the knee amputation by vascular surgery on 12/7. Hospital course complicated by pneumonia, mild, treated with cefepime, will transition to augmentin given concern for possible aspiration (hx cva, has baseline dysphagia). Chronic conditions stable though  did have persistent hyperkalemia, advise repeating bmp in 1 week. Also required insulin here, will d/c on lantus, advise monitoring fasting glucose and titrating as needed. Acute-on-chronic anemia after surgery, hgb stabilized in the 8s without need for transfusion. Discharged to skilled nursing  Procedures: See above   Consultations: Vascular surgery  Discharge Exam: Vitals:   01/02/22 0437 01/02/22 0813  BP: (!) 157/72 (!) 141/74  Pulse: 83 84  Resp: (!) 22 17  Temp: 99.6 F (37.6 C) 98.8 F (37.1 C)  SpO2: 100% 100%    General: NAD Cardiovascular: RRR Respiratory: CTAB  Discharge Instructions   Discharge Instructions     Diet - low sodium heart healthy   Complete by: As directed    Dysphagia 2   Discharge wound care:   Complete by: As directed    Change dressing daily   Increase activity slowly   Complete by: As directed       Allergies as of 01/02/2022   No Known Allergies      Medication List     TAKE these medications    acetaminophen 325 MG tablet Commonly known as: TYLENOL Take 650 mg by mouth every 6 (six) hours as needed.   amLODipine 10 MG tablet Commonly known as: NORVASC Take 1 tablet (10 mg total) by mouth daily.   amoxicillin-clavulanate 875-125 MG tablet Commonly known as: AUGMENTIN Take 1 tablet by mouth 2 (two) times daily.   apixaban 5 MG Tabs tablet Commonly known as: ELIQUIS Take 1 tablet (5 mg total) by mouth 2 (two) times daily.   aspirin EC 81 MG tablet Take 1 tablet (81 mg total) by mouth daily. Swallow whole.  atorvastatin 40 MG tablet Commonly known as: LIPITOR Take 1 tablet (40 mg total) by mouth daily at 6 PM.   bethanechol 10 MG tablet Commonly known as: URECHOLINE Take 1 tablet (10 mg total) by mouth 3 (three) times daily.   finasteride 5 MG tablet Commonly known as: PROSCAR Take 5 mg by mouth daily.   insulin glargine-yfgn 100 UNIT/ML injection Commonly known as: SEMGLEE Inject 0.1 mLs (10 Units total)  into the skin at bedtime.   levETIRAcetam 500 MG tablet Commonly known as: KEPPRA Take 1 tablet (500 mg total) by mouth 2 (two) times daily.   melatonin 5 MG Tabs Take 1 tablet (5 mg total) by mouth at bedtime.   metFORMIN 500 MG tablet Commonly known as: GLUCOPHAGE Take 1 tablet (500 mg total) by mouth 2 (two) times daily with a meal.   miconazole 2 % cream Commonly known as: MICOTIN Apply topically 2 (two) times daily. Apply to toes.   oxyCODONE 5 MG immediate release tablet Commonly known as: Roxicodone Take 1 tablet (5 mg total) by mouth every 4 (four) hours as needed for severe pain.   senna 8.6 MG Tabs tablet Commonly known as: SENOKOT Take 1 tablet (8.6 mg total) by mouth 2 (two) times daily.   tamsulosin 0.4 MG Caps capsule Commonly known as: FLOMAX Take 1 capsule (0.4 mg total) by mouth daily after supper.               Durable Medical Equipment  (From admission, onward)           Start     Ordered   12/30/21 1548  For home use only DME Hospital bed  Once       Question Answer Comment  Length of Need Lifetime   Patient has (list medical condition): right above the knee amputation   The above medical condition requires: Patient requires the ability to reposition frequently   Bed type Semi-electric      12/30/21 1547              Discharge Care Instructions  (From admission, onward)           Start     Ordered   01/02/22 0000  Discharge wound care:       Comments: Change dressing daily   01/02/22 1028           No Known Allergies  Follow-up Information     Kerri Perches, PA-C Follow up.   Specialty: Physician Assistant Contact information: [place] [address] [phone]         Algernon Huxley, MD Follow up.   Specialties: Vascular Surgery, Radiology, Interventional Cardiology Why: in 3-4 weeks Contact information: [place] [address] [phone]                   The results of significant diagnostics from this hospitalization (including imaging, microbiology, ancillary and laboratory) are listed below for reference.    Significant Diagnostic Studies: CT CHEST WO CONTRAST  Result Date: 12/31/2021 CLINICAL DATA:  Fever. Status post catheterization of right lower extremity. EXAM: CT CHEST WITHOUT CONTRAST TECHNIQUE: Multidetector CT imaging of the chest was performed following the standard protocol without IV contrast. RADIATION DOSE REDUCTION: This exam was performed according to the departmental dose-optimization program which includes automated exposure control, adjustment of the mA and/or kV according to patient size and/or use of iterative reconstruction technique. COMPARISON:  04/24/2021 FINDINGS: Cardiovascular: Normal heart size. Aortic  atherosclerosis and coronary artery calcifications. Aortic and mitral valve calcifications. No pericardial effusion. Mediastinum/Nodes: No enlarged mediastinal or axillary lymph nodes. Thyroid gland, trachea, and esophagus demonstrate no significant findings. Lungs/Pleura: Mild respiratory motion artifact diminishes lung detail. Mild paraseptal and centrilobular emphysema. Mild patchy ground-glass nodularity within the posterior basal right upper lobe is identified. No pleural fluid or airspace consolidation. No signs of pneumothorax. Upper Abdomen: No acute abnormality. There is a large stool burden noted within the imaged portions of the colon. Musculoskeletal: No chest wall mass or suspicious bone lesions identified. IMPRESSION: 1. Lung detail diminished by respiratory motion artifact. Within this limitation, there appears to be mild patchy ground-glass nodularity within the posterior basal right upper lobe, which is favored to represent an mild inflammatory or infectious process. 2. No pleural fluid or airspace consolidation identified. 3. Large stool burden noted within the imaged portions of the colon. Correlate for  any clinical signs or symptoms of constipation. 4. Coronary artery calcifications noted. 5. Aortic Atherosclerosis (ICD10-I70.0) and Emphysema (ICD10-J43.9). Electronically Signed   By: Kerby Moors M.D.   On: 12/31/2021 10:03   DG Chest Port 1 View  Result Date: 12/30/2021 CLINICAL DATA:  Fever EXAM: PORTABLE CHEST 1 VIEW COMPARISON:  Chest radiograph May 24, 2021 FINDINGS: Stable cardiac and mediastinal contours. Right mid and lower lung patchy airspace opacities. No pleural effusion or pneumothorax. Osseous structures unremarkable. IMPRESSION: Right mid and lower lung patchy airspace opacities concerning for pneumonia. Consider repeat evaluation with PA and lateral chest radiograph. Electronically Signed   By: Lovey Newcomer M.D.   On: 12/30/2021 15:40   PERIPHERAL VASCULAR CATHETERIZATION  Result Date: 12/28/2021 See surgical note for result.  MR BRAIN WO CONTRAST  Result Date: 12/27/2021 CLINICAL DATA:  Provided history: Mental status change, unknown cause. EXAM: MRI HEAD WITHOUT CONTRAST TECHNIQUE: Multiplanar, multiecho pulse sequences of the brain and surrounding structures were obtained without intravenous contrast. COMPARISON:  Prior head CT examinations 12/26/2021 and earlier. Brain MRI 04/24/2021. FINDINGS: Brain: Age advanced parenchymal atrophy, most notably affecting the cerebrum and brainstem. Ventriculomegaly, most notably of the lateral and third ventricles, similar to the prior brain MRI of 04/24/2021. Chronic lacunar infarcts within the right centrum semiovale, bilateral deep gray nuclei and within the pons. Background multifocal T2 FLAIR hyperintense signal abnormality within the cerebral white matter (mild) and within the pons (moderate). Findings are nonspecific, but compatible with chronic small vessel ischemic disease. Redemonstrated small chronic infarcts within the bilateral cerebellar hemispheres. Several chronic microhemorrhages within the brainstem and scattered within the  supratentorial brain. There is no acute infarct. No evidence of an intracranial mass. No extra-axial fluid collection. No midline shift. Vascular: Signal abnormality within the intracranial left vertebral artery, unchanged from the prior MRI and likely reflecting chronic vessel occlusion. Flow voids preserved elsewhere within the proximal large arterial vessels. Skull and upper cervical spine: No focal suspicious marrow lesion. Sinuses/Orbits: No mass or acute finding within the imaged orbits. Chronic medially displaced fracture deformity of the right lamina papyracea. No significant paranasal sinus disease. IMPRESSION: 1. No evidence of acute intracranial abnormality. 2. Stable non-contrast MRI appearance of the brain as compared to 04/24/2021. 3. Chronic small vessel ischemic disease with multiple chronic lacunar infarcts, as detailed. 4. Small chronic infarcts within bilateral cerebellar hemispheres. 5. Age advanced parenchymal atrophy, most notably affecting the cerebrum and brainstem. 6. Diffuse ventriculomegaly (most notably of the lateral and third ventricles). While this may be entirely related to parenchymal atrophy, a component of normal pressure hydrocephalus cannot be  excluded, and clinical correlation is recommended. 7. As before, there is signal abnormality within the intracranial left vertebral artery, and this vessel is likely chronically occluded. Electronically Signed   By: Kellie Simmering D.O.   On: 12/27/2021 12:34   CT ANGIO LOWER EXT BILAT W &/OR WO CONTRAST  Result Date: 12/26/2021 CLINICAL DATA:  Re-stenosis versus occult fracture. Right lower extremity pain. Fall. Peripheral artery disease procedure on 12/01. EXAM: CT ANGIOGRAPHY BILATERAL LOWER EXTREMITIES TECHNIQUE: Axial CT imaging of the bilateral lower extremities during arterial phase performed. Coronal and sagittal reformats as well as MIP reformats were submitted. CONTRAST:  157mL OMNIPAQUE IOHEXOL 350 MG/ML SOLN COMPARISON:  None  Available. FINDINGS: VASCULAR Pelvis: There are atherosclerotic calcifications of the aorta and iliac arteries. The bilateral common and external iliac arteries appear patent without significant stenosis, dissection or aneurysm. There are multiple areas of severe stenosis within the mid and distal right internal iliac artery. There is no significant vascular flow identified within the left internal iliac artery. Right lower extremity: The common femoral artery is patent. The proximal mid femoral artery are patent. There is thrombosis within 2.5 cm mid femoral artery proximal to the vascular stent. The entire stent throughout the popliteal and distal femoral artery is occluded. Severe atherosclerotic calcifications are seen throughout the anterior tibial, peroneal and posterior tibial arteries. Vascular flow is identified within the mid and distal posterior tibial artery to the level of the ankle. Flow can not be confirmed within the anterior tibial and peroneal arteries. Left lower extremity: The common femoral artery is patent. There is severe focal stenosis in the proximal femoral artery secondary to calcified atherosclerotic disease. Artery is otherwise patent. The popliteal artery is patent. Distal popliteal artery appears occluded, but there is revascularization of the entire posterior tibial artery and peroneal artery. No significant vascular flow identified in the anterior tibial artery. Osseous: No acute fracture or dislocation. No focal osseous lesions are identified. Right first and second toe amputations are present. Soft tissues: No evidence for abscess or soft tissue gas. No significant knee or hip joint effusions. Review of the MIP images confirms the above findings. IMPRESSION: Right lower extremity: 1. Occlusion of the entire right popliteal/distal femoral artery stent. 2. Occlusion of right femoral artery proximal to the stent (2.5 cm). 3. No significant vascular flow identified within the right  anterior tibial and peroneal arteries. Left lower extremity: 1. Occlusion of the distal left popliteal artery with revascularization of the entire posterior tibial artery and peroneal artery. 2. Severe focal stenosis in the proximal left femoral artery. Pelvis: 1. No significant vascular flow identified within the left internal iliac artery. Aortic Atherosclerosis (ICD10-I70.0). Electronically Signed   By: Ronney Asters M.D.   On: 12/26/2021 22:13   CT Cervical Spine Wo Contrast  Result Date: 12/26/2021 CLINICAL DATA:  Head trauma EXAM: CT HEAD WITHOUT CONTRAST CT CERVICAL SPINE WITHOUT CONTRAST TECHNIQUE: Multidetector CT imaging of the head and cervical spine was performed following the standard protocol without intravenous contrast. Multiplanar CT image reconstructions of the cervical spine were also generated. RADIATION DOSE REDUCTION: This exam was performed according to the departmental dose-optimization program which includes automated exposure control, adjustment of the mA and/or kV according to patient size and/or use of iterative reconstruction technique. COMPARISON:  07/26/2021 FINDINGS: CT HEAD FINDINGS Brain: No evidence of acute infarction, hemorrhage, extra-axial collection or mass lesion/mass effect. Unchanged prominence of the lateral and third ventricles. Mild periventricular white matter hypodensity. Vascular: No hyperdense vessel or unexpected calcification. Skull:  Normal. Negative for fracture or focal lesion. Sinuses/Orbits: No acute finding. Other: None. CT CERVICAL SPINE FINDINGS Alignment: Straightening of the normal cervical lordosis. Skull base and vertebrae: No acute fracture. No primary bone lesion or focal pathologic process. Soft tissues and spinal canal: No prevertebral fluid or swelling. No visible canal hematoma. Disc levels: Mild disc space height loss and osteophytosis of the lower cervical levels. Upper chest: Minimal emphysema. Other: None. IMPRESSION: 1. No acute  intracranial pathology. 2. Unchanged prominence of the lateral and third ventricles, as previously reported may be related to global cerebral atrophy or normal pressure hydrocephalus. 3. No fracture or static subluxation of the cervical spine. Electronically Signed   By: Delanna Ahmadi M.D.   On: 12/26/2021 16:55   CT Head Wo Contrast  Result Date: 12/26/2021 CLINICAL DATA:  Head trauma EXAM: CT HEAD WITHOUT CONTRAST CT CERVICAL SPINE WITHOUT CONTRAST TECHNIQUE: Multidetector CT imaging of the head and cervical spine was performed following the standard protocol without intravenous contrast. Multiplanar CT image reconstructions of the cervical spine were also generated. RADIATION DOSE REDUCTION: This exam was performed according to the departmental dose-optimization program which includes automated exposure control, adjustment of the mA and/or kV according to patient size and/or use of iterative reconstruction technique. COMPARISON:  07/26/2021 FINDINGS: CT HEAD FINDINGS Brain: No evidence of acute infarction, hemorrhage, extra-axial collection or mass lesion/mass effect. Unchanged prominence of the lateral and third ventricles. Mild periventricular white matter hypodensity. Vascular: No hyperdense vessel or unexpected calcification. Skull: Normal. Negative for fracture or focal lesion. Sinuses/Orbits: No acute finding. Other: None. CT CERVICAL SPINE FINDINGS Alignment: Straightening of the normal cervical lordosis. Skull base and vertebrae: No acute fracture. No primary bone lesion or focal pathologic process. Soft tissues and spinal canal: No prevertebral fluid or swelling. No visible canal hematoma. Disc levels: Mild disc space height loss and osteophytosis of the lower cervical levels. Upper chest: Minimal emphysema. Other: None. IMPRESSION: 1. No acute intracranial pathology. 2. Unchanged prominence of the lateral and third ventricles, as previously reported may be related to global cerebral atrophy or  normal pressure hydrocephalus. 3. No fracture or static subluxation of the cervical spine. Electronically Signed   By: Delanna Ahmadi M.D.   On: 12/26/2021 16:55   DG Hip Unilat With Pelvis 2-3 Views Right  Result Date: 12/26/2021 CLINICAL DATA:  Pain, no reported injury EXAM: DG HIP (WITH OR WITHOUT PELVIS) 2-3V RIGHT COMPARISON:  None Available. FINDINGS: Mild osteopenia. Internal rotation of the right hip somewhat limits evaluation. Within this limitation, no displaced fracture or dislocation of the pelvis or proximal right femur. Hip joint spaces are preserved. Nonobstructive pattern of overlying bowel gas. Vascular calcinosis. IMPRESSION: 1. Mild osteopenia. Internal rotation of the right hip somewhat limits evaluation. Within this limitation, no displaced fracture or dislocation of the pelvis or proximal right femur. 2. Hip joint spaces are preserved. Please note that plain radiographs are significantly insensitive for hip and pelvic fracture. Recommend CT to more sensitively evaluate if there is high clinical suspicion for fracture. Electronically Signed   By: Delanna Ahmadi M.D.   On: 12/26/2021 16:50   DG Knee Complete 4 Views Right  Result Date: 12/26/2021 CLINICAL DATA:  Knee pain.  No known injury. EXAM: RIGHT KNEE - COMPLETE 4+ VIEW COMPARISON:  None Available. FINDINGS: There is no acute fracture or subluxation. There is LATERAL and patellofemoral joint space narrowing. No joint effusion. Distal femoral/popliteal stent noted. IMPRESSION: Mild degenerative changes. No evidence for acute abnormality. Electronically Signed  By: Nolon Nations M.D.   On: 12/26/2021 16:49   PERIPHERAL VASCULAR CATHETERIZATION  Result Date: 12/23/2021 See surgical note for result.  VAS [place] LOWER EXTREMITY ARTERIAL DUPLEX  Result Date: 12/22/2021 LOWER EXTREMITY ARTERIAL DUPLEX STUDY Patient Name:  LOUIS IVERY  Date of Exam:   12/21/2021 Medical Rec #: [id number]       Accession #:    [id number] Date of  Birth: Jun 15, 1960       Patient Gender: M Patient Age:   64 years Exam Location:  [facility] Procedure:      VAS [place] LOWER EXTREMITY ARTERIAL DUPLEX Referring Phys: [name] --------------------------------------------------------------------------------  Indications: Ulceration.  Current ABI: Not Obtained Performing Technologist: Almira Coaster RVS  Examination Guidelines: A complete evaluation includes B-mode imaging, spectral Doppler, color Doppler, and power Doppler as needed of all accessible portions of each vessel. Bilateral testing is considered an integral part of a complete examination. Limited examinations for reoccurring indications may be performed as noted.  +-----------+--------+-----+--------+-------------------+--------+ RIGHT      PSV cm/sRatioStenosisWaveform           Comments +-----------+--------+-----+--------+-------------------+--------+ CFA Distal 131                  triphasic                   +-----------+--------+-----+--------+-------------------+--------+ DFA        140                  triphasic                   +-----------+--------+-----+--------+-------------------+--------+ SFA Prox   121                  triphasic                   +-----------+--------+-----+--------+-------------------+--------+ SFA Mid    32                   monophasic                  +-----------+--------+-----+--------+-------------------+--------+ SFA Distal 0                    Occluded                    +-----------+--------+-----+--------+-------------------+--------+ POP Distal 0                    Occluded                    +-----------+--------+-----+--------+-------------------+--------+ ATA Distal 0                    Occluded                    +-----------+--------+-----+--------+-------------------+--------+ PTA Distal 17                   Dampened Monophasic          +-----------+--------+-----+--------+-------------------+--------+ PERO Distal8                    Dampened Monophasic         +-----------+--------+-----+--------+-------------------+--------+  Summary: Right: Imaging and Waveforms obtained throughout in the Right Lower Extremity. There appears to be an Occlusion of the Right SFA Mid/Distal segment extending to the Tibial Arteries. Collateral flow seenin Right PTA and Peroneal Artery but non pulsatile.  See table(s) above for measurements and observations. Electronically signed by [name] MD on 12/22/2021 at 11:55:57 AM.    Final     Microbiology: Recent Results (from the past 240 hour(s))  Resp Panel by RT-PCR (Flu A&B, Covid) Anterior Nasal Swab     Status: None   Collection Time: 12/26/21  4:15 PM   Specimen: Anterior Nasal Swab  Result Value Ref Range Status   SARS Coronavirus 2 by RT PCR NEGATIVE NEGATIVE Final    Comment: (NOTE) SARS-CoV-2 target nucleic acids are NOT DETECTED.  The SARS-CoV-2 RNA is generally detectable in upper respiratory specimens during the acute phase of infection. The lowest concentration of SARS-CoV-2 viral copies this assay can detect is 138 copies/mL. A negative result does not preclude SARS-Cov-2 infection and should not be used as the sole basis for treatment or other patient management decisions. A negative result may occur with  improper specimen collection/handling, submission of specimen other than nasopharyngeal swab, presence of viral mutation(s) within the areas targeted by this assay, and inadequate number of viral copies(<138 copies/mL). A negative result must be combined with clinical observations, patient history, and epidemiological information. The expected result is Negative.  Fact Sheet for Patients:  EntrepreneurPulse.com.au  Fact Sheet for Healthcare Providers:  IncredibleEmployment.be  This test is no t yet approved or cleared by the  [place] FDA and  has been authorized for detection and/or diagnosis of SARS-CoV-2 by FDA under an Emergency Use Authorization (EUA). This EUA will remain  in effect (meaning this test can be used) for the duration of the COVID-19 declaration under Section 564(b)(1) of the Act, 21 U.S.C.section 360bbb-3(b)(1), unless the authorization is terminated  or revoked sooner.       Influenza A by PCR NEGATIVE NEGATIVE Final   Influenza B by PCR NEGATIVE NEGATIVE Final    Comment: (NOTE) The Xpert Xpress SARS-CoV-2/FLU/RSV plus assay is intended as an aid in the diagnosis of influenza from Nasopharyngeal swab specimens and should not be used as a sole basis for treatment. Nasal washings and aspirates are unacceptable for Xpert Xpress SARS-CoV-2/FLU/RSV testing.  Fact Sheet for Patients: EntrepreneurPulse.com.au  Fact Sheet for Healthcare Providers: IncredibleEmployment.be  This test is not yet approved or cleared by the [place] FDA and has been authorized for detection and/or diagnosis of SARS-CoV-2 by FDA under an Emergency Use Authorization (EUA). This EUA will remain in effect (meaning this test can be used) for the duration of the COVID-19 declaration under Section 564(b)(1) of the Act, 21 U.S.C. section 360bbb-3(b)(1), unless the authorization is terminated or revoked.  Performed at [facility], [address] [place], [place] [address]   Resp Panel by RT-PCR (Flu A&B, Covid) Anterior Nasal Swab     Status: None   Collection Time: 12/30/21  2:30 PM   Specimen: Anterior Nasal Swab  Result Value Ref Range Status   SARS Coronavirus 2 by RT PCR NEGATIVE NEGATIVE Final    Comment: (NOTE) SARS-CoV-2 target nucleic acids are NOT DETECTED.  The SARS-CoV-2 RNA is generally detectable in upper respiratory specimens during the acute phase of infection. The lowest concentration of SARS-CoV-2 viral copies this assay can detect  is 138 copies/mL. A negative result does not preclude SARS-Cov-2 infection and should not be used as the sole basis for treatment or other patient management decisions. A negative result may occur with  improper specimen collection/handling, submission of specimen other than nasopharyngeal swab, presence of viral mutation(s) within the areas targeted by this assay, and  inadequate number of viral copies(<138 copies/mL). A negative result must be combined with clinical observations, patient history, and epidemiological information. The expected result is Negative.  Fact Sheet for Patients:  EntrepreneurPulse.com.au  Fact Sheet for Healthcare Providers:  IncredibleEmployment.be  This test is no t yet approved or cleared by the [place] FDA and  has been authorized for detection and/or diagnosis of SARS-CoV-2 by FDA under an Emergency Use Authorization (EUA). This EUA will remain  in effect (meaning this test can be used) for the duration of the COVID-19 declaration under Section 564(b)(1) of the Act, 21 U.S.C.section 360bbb-3(b)(1), unless the authorization is terminated  or revoked sooner.       Influenza A by PCR NEGATIVE NEGATIVE Final   Influenza B by PCR NEGATIVE NEGATIVE Final    Comment: (NOTE) The Xpert Xpress SARS-CoV-2/FLU/RSV plus assay is intended as an aid in the diagnosis of influenza from Nasopharyngeal swab specimens and should not be used as a sole basis for treatment. Nasal washings and aspirates are unacceptable for Xpert Xpress SARS-CoV-2/FLU/RSV testing.  Fact Sheet for Patients: EntrepreneurPulse.com.au  Fact Sheet for Healthcare Providers: IncredibleEmployment.be  This test is not yet approved or cleared by the [place] FDA and has been authorized for detection and/or diagnosis of SARS-CoV-2 by FDA under an Emergency Use Authorization (EUA). This EUA will remain in effect  (meaning this test can be used) for the duration of the COVID-19 declaration under Section 564(b)(1) of the Act, 21 U.S.C. section 360bbb-3(b)(1), unless the authorization is terminated or revoked.  Performed at [facility], [place], [place] [address]   Respiratory (~20 pathogens) panel by PCR     Status: None   Collection Time: 12/30/21  2:30 PM   Specimen: Nasopharyngeal Swab; Respiratory  Result Value Ref Range Status   Adenovirus NOT DETECTED NOT DETECTED Final   Coronavirus 229E NOT DETECTED NOT DETECTED Final    Comment: (NOTE) The Coronavirus on the Respiratory Panel, DOES NOT test for the novel  Coronavirus (2019 nCoV)    Coronavirus HKU1 NOT DETECTED NOT DETECTED Final   Coronavirus NL63 NOT DETECTED NOT DETECTED Final   Coronavirus OC43 NOT DETECTED NOT DETECTED Final   Metapneumovirus NOT DETECTED NOT DETECTED Final   Rhinovirus / Enterovirus NOT DETECTED NOT DETECTED Final   Influenza A NOT DETECTED NOT DETECTED Final   Influenza B NOT DETECTED NOT DETECTED Final   Parainfluenza Virus 1 NOT DETECTED NOT DETECTED Final   Parainfluenza Virus 2 NOT DETECTED NOT DETECTED Final   Parainfluenza Virus 3 NOT DETECTED NOT DETECTED Final   Parainfluenza Virus 4 NOT DETECTED NOT DETECTED Final   Respiratory Syncytial Virus NOT DETECTED NOT DETECTED Final   Bordetella pertussis NOT DETECTED NOT DETECTED Final   Bordetella Parapertussis NOT DETECTED NOT DETECTED Final   Chlamydophila pneumoniae NOT DETECTED NOT DETECTED Final   Mycoplasma pneumoniae NOT DETECTED NOT DETECTED Final    Comment: Performed at [facility], [place]. [address] [place], [place] [address]  Urine Culture     Status: Abnormal   Collection Time: 12/30/21  2:30 PM   Specimen: Urine, Clean Catch  Result Value Ref Range Status   Specimen Description   Final    URINE, CLEAN CATCH Performed at [facility], [address], [place], [place] [address]    Special  Requests   Final    NONE Performed at [facility], [address], [place], [place] [address]    Culture (A)  Final    <10,000 COLONIES/mL INSIGNIFICANT GROWTH Performed at [place] [address], [place], [place] [address]    Report Status 01/01/2022 FINAL  Final  Culture, blood (Routine X 2) w Reflex to ID Panel     Status: None (Preliminary result)   Collection Time: 12/30/21  2:33 PM   Specimen: BLOOD  Result Value Ref Range Status   Specimen Description BLOOD BLOOD RIGHT HAND  Final   Special Requests   Final    BOTTLES DRAWN AEROBIC AND ANAEROBIC Blood Culture adequate volume   Culture   Final    NO GROWTH 3 DAYS Performed at [facility], [address], [place], [place] [address]    Report Status PENDING  Incomplete  Culture, blood (Routine X 2) w Reflex to ID Panel     Status: None (Preliminary result)   Collection Time: 12/30/21  2:33 PM   Specimen: BLOOD  Result Value Ref Range Status   Specimen Description BLOOD BLOOD LEFT HAND  Final   Special Requests   Final    BOTTLES DRAWN AEROBIC AND ANAEROBIC Blood Culture adequate volume   Culture   Final    NO GROWTH 3 DAYS Performed at [facility], [address], [place], [place] [address]    Report Status PENDING  Incomplete  Expectorated Sputum Assessment w Gram Stain, Rflx to Resp Cult     Status: None   Collection Time: 12/31/21  6:30 PM   Specimen: Sputum  Result Value Ref Range Status   Specimen Description SPUTUM  Final   Special Requests NONE  Final   Sputum evaluation   Final    THIS SPECIMEN IS ACCEPTABLE FOR SPUTUM CULTURE Performed at [facility], [address], [place], [place] [address]    Report Status 12/31/2021 FINAL  Final  Culture, Respiratory w Gram Stain     Status: None (Preliminary result)   Collection Time: 12/31/21  6:30 PM   Specimen: SPU  Result Value Ref Range Status   Specimen Description   Final    SPUTUM Performed at [facility], [address] [place], [place] [address]    Special Requests   Final    NONE Reflexed from [id number] Performed at [facility], [address] [place], [place] [address]    Gram Stain   Final    MODERATE WBC PRESENT, PREDOMINANTLY MONONUCLEAR MODERATE GRAM NEGATIVE RODS FEW GRAM POSITIVE COCCI IN PAIRS    Culture   Final    CULTURE REINCUBATED FOR BETTER GROWTH Performed at [facility], [place] [address], [place], [place] [address]    Report Status PENDING  Incomplete     Labs: Basic Metabolic Panel: Recent Labs  Lab 12/29/21 0401 12/29/21 0620 12/29/21 1117 12/30/21 0419 12/31/21 0540 01/01/22 0651 01/02/22 0531  NA 140  --   --  136 138 136 139  K 2.6*  --  3.7 3.1* 3.0* 3.4* 4.0  CL 109  --   --  107 107 105 106  CO2 25  --   --  25 26 27 26   GLUCOSE 143*  --   --  218* 163* 201* 275*  BUN 15  --   --  11 11 17 16   CREATININE 0.73  --   --  0.64 0.70 0.69 0.77  CALCIUM 7.9*  --   --  7.7* 8.1* 8.1* 8.4*  MG  --  1.9  --   --  1.8 2.2 2.1   Liver Function Tests: Recent Labs  Lab 12/26/21 1615  AST 46*  ALT 18  ALKPHOS 78  BILITOT 0.9  PROT 8.8*  ALBUMIN 3.5   Recent Labs  Lab 12/26/21 1615  LIPASE 28   No results for input(s): "AMMONIA" in the last 168 hours. CBC: Recent Labs  Lab 12/26/21 1615 12/28/21 0612 12/29/21 0401 12/30/21 0419 01/01/22 0651 01/02/22 0531  WBC 11.0* 9.6 8.5 7.9 7.3 7.2  NEUTROABS 9.1*  --   --   --   --   --   HGB 12.0* 9.9* 9.0* 8.5* 8.1* 8.3*  HCT 37.2* 30.7* 27.4* 25.5* 25.1* 26.1*  MCV 88.6 89.0 87.8 86.4 88.1 89.4  PLT 203 198 186 182 226 260   Cardiac Enzymes: Recent Labs  Lab 12/26/21 1615  CKTOTAL 1,788*   BNP: BNP (last 3 results) No results for input(s): "BNP" in the last 8760 hours.  ProBNP (last 3 results) No results for input(s): "PROBNP" in the last 8760 hours.  CBG: Recent Labs  Lab 01/01/22 0827 01/01/22 1144 01/01/22 1628 01/01/22 2029 01/02/22 0813   GLUCAP 180* 276* 234* 266* 271*       Signed:  Desma Maxim MD.  [facility] 01/02/2022, 10:33 AM

## 2022-01-02 NOTE — TOC Transition Note (Signed)
Transition of Care [facility]) - CM/SW Discharge Note   Patient Details  Name: Beckam Abdulaziz MRN: [id number] Date of Birth: 1960/06/02  Transition of Care [facility]) CM/SW Contact:  Tiburcio Bash, LCSW Phone Number: 01/02/2022, 10:59 AM   Clinical Narrative:     Patient will DC to: [facility] Anticipated DC date: 01/02/22 Family notified: sister Scientist, physiological byJohnanna Schneiders  Per MD patient ready for DC to [facility] . RN, patient, patient's family, and facility notified of DC. Discharge Summary sent to facility. RN given number for report  [phone]. DC packet on chart. Ambulance transport requested for patient.  CSW signing off.  Pricilla Riffle, LCSW    Final next level of care: Skilled Nursing Facility Barriers to Discharge: No Barriers Identified   Patient Goals and CMS Choice Patient states their goals for this hospitalization and ongoing recovery are:: to go home CMS Medicare.gov Compare Post Acute Care list provided to:: Patient Choice offered to / list presented to : Patient  Discharge Placement                       Discharge Plan and Services     Post Acute Care Choice:  (Patient declined bed offer from [facility])                               Social Determinants of Health (SDOH) Interventions     Readmission Risk Interventions     No data to display

## 2022-01-03 LAB — CULTURE, RESPIRATORY W GRAM STAIN: Culture: NORMAL

## 2022-01-04 LAB — CULTURE, BLOOD (ROUTINE X 2)
Culture: NO GROWTH
Culture: NO GROWTH
Special Requests: ADEQUATE
Special Requests: ADEQUATE

## 2022-01-04 LAB — LEGIONELLA PNEUMOPHILA SEROGP 1 UR AG: L. pneumophila Serogp 1 Ur Ag: NEGATIVE

## 2022-01-24 ENCOUNTER — Other Ambulatory Visit (INDEPENDENT_AMBULATORY_CARE_PROVIDER_SITE_OTHER): Payer: Self-pay | Admitting: Vascular Surgery

## 2022-01-24 DIAGNOSIS — Z89511 Acquired absence of right leg below knee: Secondary | ICD-10-CM

## 2022-01-24 DIAGNOSIS — Z9889 Other specified postprocedural states: Secondary | ICD-10-CM

## 2022-01-24 DIAGNOSIS — Z89611 Acquired absence of right leg above knee: Secondary | ICD-10-CM

## 2022-01-27 ENCOUNTER — Ambulatory Visit (INDEPENDENT_AMBULATORY_CARE_PROVIDER_SITE_OTHER): Payer: PRIVATE HEALTH INSURANCE

## 2022-01-27 ENCOUNTER — Encounter (INDEPENDENT_AMBULATORY_CARE_PROVIDER_SITE_OTHER): Payer: Self-pay | Admitting: Vascular Surgery

## 2022-01-27 ENCOUNTER — Ambulatory Visit (INDEPENDENT_AMBULATORY_CARE_PROVIDER_SITE_OTHER): Payer: Medicare Other | Admitting: Vascular Surgery

## 2022-01-27 VITALS — BP 132/75 | HR 81 | Resp 16

## 2022-01-27 DIAGNOSIS — I739 Peripheral vascular disease, unspecified: Secondary | ICD-10-CM | POA: Diagnosis not present

## 2022-01-27 DIAGNOSIS — I10 Essential (primary) hypertension: Secondary | ICD-10-CM

## 2022-01-27 DIAGNOSIS — Z9889 Other specified postprocedural states: Secondary | ICD-10-CM | POA: Diagnosis not present

## 2022-01-27 DIAGNOSIS — Z89611 Acquired absence of right leg above knee: Secondary | ICD-10-CM

## 2022-01-27 DIAGNOSIS — E1165 Type 2 diabetes mellitus with hyperglycemia: Secondary | ICD-10-CM

## 2022-01-27 NOTE — Assessment & Plan Note (Signed)
blood pressure control important in reducing the progression of atherosclerotic disease. On appropriate oral medications.  
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
## 2022-01-27 NOTE — Assessment & Plan Note (Signed)
Now status post AKA on the right We assessed his left lower extremity blood flow today and his left ABI being 1.23 with biphasic waveforms and normal digital pressures.

## 2022-01-27 NOTE — Assessment & Plan Note (Signed)
blood glucose control important in reducing the progression of atherosclerotic disease. Also, involved in wound healing. On appropriate medications.  

## 2022-02-10 ENCOUNTER — Ambulatory Visit (INDEPENDENT_AMBULATORY_CARE_PROVIDER_SITE_OTHER): Payer: Medicare Other | Admitting: Nurse Practitioner

## 2022-02-10 VITALS — BP 128/79 | HR 84

## 2022-02-10 DIAGNOSIS — I739 Peripheral vascular disease, unspecified: Secondary | ICD-10-CM

## 2022-02-27 ENCOUNTER — Encounter (INDEPENDENT_AMBULATORY_CARE_PROVIDER_SITE_OTHER): Payer: Self-pay | Admitting: Nurse Practitioner
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
## 2022-03-17 ENCOUNTER — Encounter (INDEPENDENT_AMBULATORY_CARE_PROVIDER_SITE_OTHER): Payer: Self-pay | Admitting: Nurse Practitioner

## 2022-03-17 ENCOUNTER — Ambulatory Visit (INDEPENDENT_AMBULATORY_CARE_PROVIDER_SITE_OTHER): Payer: Medicare Other | Admitting: Nurse Practitioner

## 2022-03-17 VITALS — BP 109/71 | HR 76 | Resp 17 | Ht 67.0 in | Wt 142.0 lb

## 2022-03-17 DIAGNOSIS — I739 Peripheral vascular disease, unspecified: Secondary | ICD-10-CM | POA: Diagnosis not present

## 2022-03-17 DIAGNOSIS — Z89611 Acquired absence of right leg above knee: Secondary | ICD-10-CM | POA: Diagnosis not present

## 2022-03-17 DIAGNOSIS — E1165 Type 2 diabetes mellitus with hyperglycemia: Secondary | ICD-10-CM | POA: Diagnosis not present

## 2022-03-17 DIAGNOSIS — I10 Essential (primary) hypertension: Secondary | ICD-10-CM | POA: Diagnosis not present

## 2022-03-23 ENCOUNTER — Encounter (INDEPENDENT_AMBULATORY_CARE_PROVIDER_SITE_OTHER): Payer: Self-pay | Admitting: Nurse Practitioner

## 2022-03-23 ENCOUNTER — Telehealth (INDEPENDENT_AMBULATORY_CARE_PROVIDER_SITE_OTHER): Payer: Self-pay | Admitting: Nurse Practitioner
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
## 2022-03-23 NOTE — Telephone Encounter (Signed)
Dr. Durene [facility] called and wants notes from 03/17/22 faxed over to them at [phone]. I checked and no notes has been put into the system as of yet.  Please advise.

## 2022-03-27 NOTE — Telephone Encounter (Signed)
Note is completed.

## 2022-03-27 NOTE — Telephone Encounter (Signed)
Note faxed.

## 2022-05-12 ENCOUNTER — Encounter (INDEPENDENT_AMBULATORY_CARE_PROVIDER_SITE_OTHER): Payer: Medicare Other

## 2022-05-12 ENCOUNTER — Ambulatory Visit (INDEPENDENT_AMBULATORY_CARE_PROVIDER_SITE_OTHER): Payer: MEDICAID | Admitting: Vascular Surgery

## 2022-05-22 ENCOUNTER — Other Ambulatory Visit (INDEPENDENT_AMBULATORY_CARE_PROVIDER_SITE_OTHER): Payer: Self-pay | Admitting: Nurse Practitioner

## 2022-05-22 DIAGNOSIS — Z9889 Other specified postprocedural states: Secondary | ICD-10-CM

## 2022-05-26 ENCOUNTER — Ambulatory Visit (INDEPENDENT_AMBULATORY_CARE_PROVIDER_SITE_OTHER): Payer: Medicare Other | Admitting: Nurse Practitioner

## 2022-05-26 ENCOUNTER — Encounter (INDEPENDENT_AMBULATORY_CARE_PROVIDER_SITE_OTHER): Payer: Self-pay | Admitting: Vascular Surgery

## 2022-05-26 ENCOUNTER — Ambulatory Visit (INDEPENDENT_AMBULATORY_CARE_PROVIDER_SITE_OTHER): Payer: Medicare Other

## 2022-05-26 VITALS — BP 120/68 | HR 71 | Resp 16

## 2022-05-26 DIAGNOSIS — I7025 Atherosclerosis of native arteries of other extremities with ulceration: Secondary | ICD-10-CM | POA: Diagnosis not present

## 2022-05-26 DIAGNOSIS — Z9889 Other specified postprocedural states: Secondary | ICD-10-CM

## 2022-05-26 DIAGNOSIS — I739 Peripheral vascular disease, unspecified: Secondary | ICD-10-CM | POA: Diagnosis not present

## 2022-05-26 DIAGNOSIS — I10 Essential (primary) hypertension: Secondary | ICD-10-CM

## 2022-05-26 DIAGNOSIS — E1165 Type 2 diabetes mellitus with hyperglycemia: Secondary | ICD-10-CM | POA: Diagnosis not present

## 2022-05-29 LAB — VAS US ABI WITH/WO TBI: Left ABI: 0.88

## 2022-06-12 ENCOUNTER — Encounter (INDEPENDENT_AMBULATORY_CARE_PROVIDER_SITE_OTHER): Payer: Self-pay | Admitting: Nurse Practitioner

## 2022-06-12 ENCOUNTER — Telehealth (INDEPENDENT_AMBULATORY_CARE_PROVIDER_SITE_OTHER): Payer: Self-pay
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
## 2022-06-12 NOTE — Telephone Encounter (Signed)
Spoke with Brandy at [facility], and the patient is scheduled with Dr. Wyn Quaker for a LLE angio on 06/26/22 with a 9:30 am arrival time to the [facility]. Pre-procedure instructions will be faxed to [facility] at [facility].

## 2022-06-26 ENCOUNTER — Ambulatory Visit
Admission: RE | Admit: 2022-06-26 | Discharge: 2022-06-26 | Disposition: A | Discharge status: Home / Self Care | Payer: Medicare Other | Attending: Vascular Surgery | Admitting: Vascular Surgery

## 2022-06-26 DIAGNOSIS — L97909 Non-pressure chronic ulcer of unspecified part of unspecified lower leg with unspecified severity: Secondary | ICD-10-CM

## 2022-06-26 MED ORDER — SODIUM CHLORIDE 0.9 % IV SOLN: INTRAVENOUS | Status: DC

## 2022-06-26 MED ORDER — CEFAZOLIN SODIUM-DEXTROSE 2-4 GM/100ML-% IV SOLN: 2.0000 g | INTRAVENOUS | Status: DC

## 2022-06-26 MED ORDER — MIDAZOLAM HCL 2 MG/ML PO SYRP: 8.0000 mg | ORAL_SOLUTION | Freq: Once | ORAL | Status: DC | PRN

## 2022-06-26 MED ORDER — METHYLPREDNISOLONE SODIUM SUCC 125 MG IJ SOLR: 125.0000 mg | Freq: Once | INTRAMUSCULAR | Status: DC | PRN

## 2022-06-26 MED ORDER — DIPHENHYDRAMINE HCL 50 MG/ML IJ SOLN: 50.0000 mg | Freq: Once | INTRAMUSCULAR | Status: DC | PRN

## 2022-06-26 MED ORDER — FAMOTIDINE 20 MG PO TABS: 40.0000 mg | ORAL_TABLET | Freq: Once | ORAL | Status: DC | PRN
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
## 2022-06-26 NOTE — Telephone Encounter (Signed)
This is from [place] [facility]: this patient ate breakfast and will need to be rescheduled thanks. he is from [facility]   The patient has been rescheduled with Dr. Wyn Quaker for 07/03/22 with a 9:30 am arrival to the [facility]. Pre-procedure instructions will be sent to [facility] at [facility].
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
## 2022-07-01 ENCOUNTER — Other Ambulatory Visit (INDEPENDENT_AMBULATORY_CARE_PROVIDER_SITE_OTHER): Payer: Self-pay | Admitting: Nurse Practitioner

## 2022-07-03 ENCOUNTER — Encounter: Payer: Self-pay | Admitting: Vascular Surgery

## 2022-07-03 ENCOUNTER — Ambulatory Visit: Admit: 2022-07-03 | Payer: Medicare Other | Admitting: Vascular Surgery

## 2022-07-03 ENCOUNTER — Other Ambulatory Visit: Payer: Self-pay

## 2022-07-03 ENCOUNTER — Ambulatory Visit
Admission: RE | Admit: 2022-07-03 | Discharge: 2022-07-03 | Disposition: A | Discharge status: Skilled Nursing Facility | Payer: Medicare Other | Source: Ambulatory Visit | Attending: Vascular Surgery | Admitting: Vascular Surgery

## 2022-07-03 ENCOUNTER — Encounter
Admission: RE | Disposition: A | Discharge status: Skilled Nursing Facility | Payer: Self-pay | Source: Ambulatory Visit | Attending: Vascular Surgery

## 2022-07-03 DIAGNOSIS — E11621 Type 2 diabetes mellitus with foot ulcer: Secondary | ICD-10-CM | POA: Insufficient documentation

## 2022-07-03 DIAGNOSIS — I10 Essential (primary) hypertension: Secondary | ICD-10-CM | POA: Diagnosis not present

## 2022-07-03 DIAGNOSIS — I70245 Atherosclerosis of native arteries of left leg with ulceration of other part of foot: Secondary | ICD-10-CM

## 2022-07-03 DIAGNOSIS — L97529 Non-pressure chronic ulcer of other part of left foot with unspecified severity: Secondary | ICD-10-CM | POA: Diagnosis not present

## 2022-07-03 DIAGNOSIS — Z89611 Acquired absence of right leg above knee: Secondary | ICD-10-CM | POA: Diagnosis not present

## 2022-07-03 DIAGNOSIS — E1151 Type 2 diabetes mellitus with diabetic peripheral angiopathy without gangrene: Secondary | ICD-10-CM | POA: Insufficient documentation

## 2022-07-03 DIAGNOSIS — F17210 Nicotine dependence, cigarettes, uncomplicated: Secondary | ICD-10-CM | POA: Insufficient documentation

## 2022-07-03 DIAGNOSIS — Z9889 Other specified postprocedural states: Secondary | ICD-10-CM | POA: Diagnosis not present

## 2022-07-03 DIAGNOSIS — E1165 Type 2 diabetes mellitus with hyperglycemia: Secondary | ICD-10-CM | POA: Diagnosis not present

## 2022-07-03 DIAGNOSIS — L97909 Non-pressure chronic ulcer of unspecified part of unspecified lower leg with unspecified severity: Secondary | ICD-10-CM

## 2022-07-03 HISTORY — PX: LOWER EXTREMITY ANGIOGRAPHY: CATH118251

## 2022-07-03 LAB — BUN: BUN: 13 mg/dL (ref 6–20)

## 2022-07-03 LAB — GLUCOSE, CAPILLARY
Glucose-Capillary: 76 mg/dL (ref 70–99)
Glucose-Capillary: 120 mg/dL — ABNORMAL HIGH (ref 70–99)

## 2022-07-03 LAB — CREATININE, SERUM
Creatinine, Ser: 0.55 mg/dL — ABNORMAL LOW (ref 0.61–1.24)
GFR, Estimated: >60 mL/min (ref >60)

## 2022-07-03 SURGERY — LOWER EXTREMITY ANGIOGRAPHY
Anesthesia: Moderate Sedation | Site: Leg Lower | Laterality: Left

## 2022-07-03 MED ORDER — SODIUM CHLORIDE 0.9 % IV SOLN: INTRAVENOUS | Status: DC

## 2022-07-03 MED ORDER — FENTANYL CITRATE (PF) 100 MCG/2ML IJ SOLN
INTRAMUSCULAR | Status: DC | PRN
  Administered 2022-07-03 (×2): 50 ug via INTRAVENOUS

## 2022-07-03 MED ORDER — CEFAZOLIN SODIUM-DEXTROSE 2-4 GM/100ML-% IV SOLN
2.0000 g | Freq: Once | INTRAVENOUS | Status: AC
  Administered 2022-07-03: 2 g via INTRAVENOUS

## 2022-07-03 MED ORDER — SODIUM CHLORIDE 0.9% FLUSH: 3.0000 mL | INTRAVENOUS | Status: DC | PRN

## 2022-07-03 MED ORDER — HEPARIN SODIUM (PORCINE) 1000 UNIT/ML IJ SOLN
INTRAMUSCULAR | Status: AC
  Filled 2022-07-03: qty 10

## 2022-07-03 MED ORDER — FENTANYL CITRATE (PF) 100 MCG/2ML IJ SOLN
INTRAMUSCULAR | Status: AC
  Filled 2022-07-03: qty 2

## 2022-07-03 MED ORDER — ACETAMINOPHEN 325 MG PO TABS: 650.0000 mg | ORAL_TABLET | ORAL | Status: DC | PRN

## 2022-07-03 MED ORDER — IODIXANOL 320 MG/ML IV SOLN
INTRAVENOUS | Status: DC | PRN
  Administered 2022-07-03: 75 mL

## 2022-07-03 MED ORDER — ONDANSETRON HCL 4 MG/2ML IJ SOLN: 4.0000 mg | Freq: Four times a day (QID) | INTRAMUSCULAR | Status: DC | PRN

## 2022-07-03 MED ORDER — DEXTROSE 50 % IV SOLN
12.5000 g | Freq: Once | INTRAVENOUS | Status: AC
  Administered 2022-07-03: 12.5 g via INTRAVENOUS

## 2022-07-03 MED ORDER — HEPARIN SODIUM (PORCINE) 1000 UNIT/ML IJ SOLN
INTRAMUSCULAR | Status: DC | PRN
  Administered 2022-07-03: 5000 [IU] via INTRAVENOUS

## 2022-07-03 MED ORDER — DEXTROSE 50 % IV SOLN
INTRAVENOUS | Status: AC
  Filled 2022-07-03: qty 50

## 2022-07-03 MED ORDER — HYDROMORPHONE HCL 1 MG/ML IJ SOLN: 1.0000 mg | Freq: Once | INTRAMUSCULAR | Status: DC | PRN

## 2022-07-03 MED ORDER — MIDAZOLAM HCL 2 MG/2ML IJ SOLN
INTRAMUSCULAR | Status: AC
  Filled 2022-07-03: qty 2

## 2022-07-03 MED ORDER — LABETALOL HCL 5 MG/ML IV SOLN: 10.0000 mg | INTRAVENOUS | Status: DC | PRN

## 2022-07-03 MED ORDER — SODIUM CHLORIDE 0.9% FLUSH: 3.0000 mL | Freq: Two times a day (BID) | INTRAVENOUS | Status: DC

## 2022-07-03 MED ORDER — SODIUM CHLORIDE 0.9 % IV SOLN: 250.0000 mL | INTRAVENOUS | Status: DC | PRN

## 2022-07-03 MED ORDER — CEFAZOLIN SODIUM-DEXTROSE 2-4 GM/100ML-% IV SOLN
INTRAVENOUS | Status: AC
  Filled 2022-07-03: qty 100

## 2022-07-03 MED ORDER — HYDRALAZINE HCL 20 MG/ML IJ SOLN: 5.0000 mg | INTRAMUSCULAR | Status: DC | PRN

## 2022-07-03 MED ORDER — MIDAZOLAM HCL 2 MG/2ML IJ SOLN
INTRAMUSCULAR | Status: DC | PRN
  Administered 2022-07-03 (×2): 1 mg via INTRAVENOUS

## 2022-07-03 SURGICAL SUPPLY — 30 items
BALLN LUTONIX 018 4X60X130 (BALLOONS) ×1
BALLN LUTONIX 018 4X80X130 (BALLOONS) ×1
BALLN LUTONIX 018 5X100X130 (BALLOONS) ×1
BALLN LUTONIX 018 5X220X130 (BALLOONS) ×1
BALLN LUTONIX 018 5X40X130 (BALLOONS) ×1
BALLN LUTONIX 018 5X60X130 (BALLOONS) ×1
BALLN ULTRVRSE 3X220X150 (BALLOONS) ×1
BALLOON LUTONIX 018 4X60X130 (BALLOONS) IMPLANT
BALLOON LUTONIX 018 4X80X130 (BALLOONS) IMPLANT
BALLOON LUTONIX 018 5X100X130 (BALLOONS) IMPLANT
BALLOON LUTONIX 018 5X220X130 (BALLOONS) IMPLANT
BALLOON LUTONIX 018 5X40X130 (BALLOONS) IMPLANT
BALLOON LUTONIX 018 5X60X130 (BALLOONS) IMPLANT
BALLOON ULTRVRSE 3X220X150 (BALLOONS) IMPLANT
CATH ANGIO 5F PIGTAIL 65CM (CATHETERS) IMPLANT
CATH BEACON 5 .038 100 VERT TP (CATHETERS) IMPLANT
CATH NAVICROSS ANGLED 135CM (MICROCATHETER) IMPLANT
COVER PROBE ULTRASOUND 5X96 (MISCELLANEOUS) IMPLANT
DEVICE STARCLOSE SE CLOSURE (Vascular Products) IMPLANT
GLIDEWIRE ADV .035X260CM (WIRE) IMPLANT
KIT ENCORE 26 ADVANTAGE (KITS) IMPLANT
PACK ANGIOGRAPHY (CUSTOM PROCEDURE TRAY) ×1 IMPLANT
SHEATH ANL2 6FRX45 HC (SHEATH) IMPLANT
SHEATH BRITE TIP 5FRX11 (SHEATH) IMPLANT
STENT LIFESTENT 5F 5X120X135 (Permanent Stent) IMPLANT
STENT VIABAHN 6X25X120 (Permanent Stent) IMPLANT
SYR MEDRAD MARK 7 150ML (SYRINGE) IMPLANT
TUBING CONTRAST HIGH PRESS 72 (TUBING) IMPLANT
WIRE COMMAND ST 018 300CM (WIRE) IMPLANT
WIRE GUIDERIGHT .035X150 (WIRE) IMPLANT

## 2022-07-03 NOTE — Op Note (Signed)
[facility]  Percutaneous Study/Intervention Procedural Note   Date of Surgery: 07/03/2022  Surgeon(s):Whyatt Klinger    Assistants:none  Pre-operative Diagnosis: PAD with ulceration left lower extremity  Post-operative diagnosis:  Same  Procedure(s) Performed:             1.  Ultrasound guidance for vascular access right femoral artery             2.  Catheter placement into left common femoral artery from right femoral approach             3.  Aortogram and selective left lower extremity angiogram             4.  Percutaneous transluminal angioplasty of left posterior tibial artery and tibioperoneal trunk with 3 mm diameter conventional and 4 mm diameter Lutonix drug-coated angioplasty balloons             5.  Percutaneous transluminal angioplasty of the left distal SFA and popliteal artery with 5 mm diameter by 22 cm length Lutonix drug-coated angioplasty balloon  6.  Percutaneous transluminal angioplasty of the proximal left SFA with a 5 mm diameter by 6 cm length Lutonix drug-coated angioplasty balloon  7.  Stent placement to the proximal left SFA with 6 mm diameter by 2.5 cm length Viabahn stent  8.  Stent placement to the left tibioperoneal trunk and proximal posterior tibial artery with 5 mm diameter by 12 cm length life stent             9.  StarClose closure device right femoral artery  EBL: 10 cc  Contrast: 75 cc  Fluoro Time: 14.5 minutes  Moderate Conscious Sedation Time: approximately 63 minutes using 2 mg of Versed and 100 mcg of Fentanyl              Indications:  Patient is a 61 y.o.male with nonhealing ulcerations of the left foot and pain with a long history of PAD having already lost the right lower extremity. The patient has noninvasive study showing a reduced left ABI. The patient is brought in for angiography for further evaluation and potential treatment.  Due to the limb threatening nature of the situation, angiogram was performed for  attempted limb salvage. The patient is aware that if the procedure fails, amputation would be expected.  The patient also understands that even with successful revascularization, amputation may still be required due to the severity of the situation.  Risks and benefits are discussed and informed consent is obtained.   Procedure:  The patient was identified and appropriate procedural time out was performed.  The patient was then placed supine on the table and prepped and draped in the usual sterile fashion. Moderate conscious sedation was administered during a face to face encounter with the patient throughout the procedure with my supervision of the RN administering medicines and monitoring the patient's vital signs, pulse oximetry, telemetry and mental status throughout from the start of the procedure until the patient was taken to the recovery room. Ultrasound was used to evaluate the right common femoral artery.  It was patent .  A digital ultrasound image was acquired.  A Seldinger needle was used to access the right common femoral artery under direct ultrasound guidance and a permanent image was performed.  A 0.035 J wire was advanced without resistance and a 5Fr sheath was placed.  Pigtail catheter was placed into the aorta and an AP aortogram was performed. This demonstrated normal renal arteries and normal aorta  and iliac segments without significant stenosis. I then crossed the aortic bifurcation and advanced to the left femoral head. Selective left lower extremity angiogram was then performed. This demonstrated stenosis at the origin of the SFA and the distal common femoral artery and the 70% range.  The SFA then normalized down to the distal SFA where there is another stenosis in the 60% range.  In the mid popliteal artery was about a 70% stenosis and then there was occlusion of the distal popliteal artery, tibioperoneal trunk, and proximal posterior tibial artery with the posterior tibial artery then  being the dominant runoff to the foot after a proximal occlusion.  The anterior tibial artery was chronically occluded without distal reconstitution.  There was reconstitution of a small peroneal artery which provided some additional runoff distally but not really any flow to the foot. It was felt that it was in the patient's best interest to proceed with intervention after these images to avoid a second procedure and a larger amount of contrast and fluoroscopy based off of the findings from the initial angiogram.  The patient is also an extremely poor surgical candidate so femoral endarterectomy for the proximal SFA and common femoral lesion was suboptimal and we did our best to avoid this with endovascular therapy.  The patient was systemically heparinized and a 6 [place] [employer] sheath was then placed over the [employer] wire. I then used a Kumpe catheter and the advantage wire to navigate down through the SFA and popliteal lesions and get down into the tibioperoneal trunk occlusion.  This was a difficult chronic occlusion was tediously crossed first with a 0.018 advantage wire and then with a command wire switching back to a 0.018 advantage wire after we regain intraluminal flow in the proximal posterior tibial artery.  The wire was then parked at the ankle.  The diagnostic catheter was removed and we proceeded with treatment.  A 3 mm diameter by 22 cm length Lutonix drug-coated angioplasty balloon was inflated in the posterior tibial artery, tibioperoneal trunk, and distal popliteal artery up to 12 atm for 1 minute.  A 4 mm diameter by 8 cm length Lutonix drug-coated angioplasty balloon was used to treat the tibioperoneal trunk and most proximal posterior tibial artery with there was suboptimal result from the 3 mm balloon.  A 5 mm diameter by 22 cm length Lutonix drug-coated angioplasty balloon was inflated throughout the popliteal artery and in the distal SFA to encompass these lesions.  This was taken up  to 10 atm for 1 minute.  The proximal SFA was treated with a 5 mm diameter by 6 cm length Lutonix drug-coated angioplasty balloon for the origin stenosis.  The origin stenosis still had a greater than 60% residual stenosis with wall irregularity so a short 6 mm diameter by 2.5 cm length Viabahn stent was placed in the proximal left SFA.  This was postdilated with 5 mm balloon with excellent angiographic completion result and less than 10% residual stenosis.  The SFA down to the mid popliteal artery had less than 30% residual stenosis after angioplasty, but the tibioperoneal trunk and proximal posterior tibial artery still remained with greater than 50% residual stenosis.  As this was the really the only runoff distally and he was a very poor surgical candidate for surgical bypass, I elected to stent down into the posterior tibial artery and through the tibioperoneal trunk.  A 5 mm diameter by 12 cm length life stent was then deployed with its distal aspect in  the posterior tibial artery and encompassing the entirety of the tibioperoneal trunk and terminating proximally in the distal popliteal artery.  This was postdilated with a 4 mm Lutonix balloon distally and a 5 mm Lutonix balloon proximally with excellent angiographic completion result and less than 10% residual stenosis. I elected to terminate the procedure. The sheath was removed and StarClose closure device was deployed in the right femoral artery with excellent hemostatic result. The patient was taken to the recovery room in stable condition having tolerated the procedure well.  Findings:               Aortogram:  This demonstrated normal renal arteries and normal aorta and iliac segments without significant stenosis.             Left Lower Extremity:  This demonstrated stenosis at the origin of the SFA and the distal common femoral artery and the 70% range.  The SFA then normalized down to the distal SFA where there is another stenosis in the 60%  range.  In the mid popliteal artery was about a 70% stenosis and then there was occlusion of the distal popliteal artery, tibioperoneal trunk, and proximal posterior tibial artery with the posterior tibial artery then being the dominant runoff to the foot after a proximal occlusion.  The anterior tibial artery was chronically occluded without distal reconstitution.  There was reconstitution of a small peroneal artery which provided some additional runoff distally but not really any flow to the foot.   Disposition: Patient was taken to the recovery room in stable condition having tolerated the procedure well.  Complications: None  Festus Barren 07/03/2022 12:32 PM   This note was created with Dragon Medical transcription system. Any errors in dictation are purely unintentional.

## 2022-07-03 NOTE — Discharge Instructions (Signed)

## 2022-07-03 NOTE — Progress Notes (Signed)
Patient did well for procedure, VSS, denies pain, sob, and nausea, dressing cdi, doppler pulses, spoke with patients sister/guardian, updated Diane on procedure and upcoming followup that's scheduled. Patient was incontinent of bowel and bladder, tolerated a whole meal and drink, transported back to nursing facility in stable condition.  Quin Hoop

## 2022-07-03 NOTE — Interval H&P Note (Signed)
History and Physical Interval Note:  07/03/2022 10:24 AM  [name]  has presented today for surgery, with the diagnosis of LLE Angio  BARD   ASO w ulceration.  The various methods of treatment have been discussed with the patient and family. After consideration of risks, benefits and other options for treatment, the patient has consented to  Procedure(s): Lower Extremity Angiography (Left) as a surgical intervention.  The patient's history has been reviewed, patient examined, no change in status, stable for surgery.  I have reviewed the patient's chart and labs.  Questions were answered to the patient's satisfaction.     Festus Barren

## 2022-07-24 ENCOUNTER — Other Ambulatory Visit (INDEPENDENT_AMBULATORY_CARE_PROVIDER_SITE_OTHER): Payer: Self-pay | Admitting: Vascular Surgery

## 2022-07-24 DIAGNOSIS — I739 Peripheral vascular disease, unspecified: Secondary | ICD-10-CM

## 2022-08-02 ENCOUNTER — Ambulatory Visit (INDEPENDENT_AMBULATORY_CARE_PROVIDER_SITE_OTHER): Payer: Medicare Other | Admitting: Nurse Practitioner

## 2022-08-02 ENCOUNTER — Encounter (INDEPENDENT_AMBULATORY_CARE_PROVIDER_SITE_OTHER): Payer: Medicare Other

## 2022-08-02 ENCOUNTER — Inpatient Hospital Stay
Admission: EM | Admit: 2022-08-02 | Discharge: 2022-08-09 | DRG: 871 | Disposition: A | Discharge status: Home Health | Payer: Medicare Other | Attending: Internal Medicine | Admitting: Internal Medicine

## 2022-08-02 ENCOUNTER — Other Ambulatory Visit: Payer: Self-pay

## 2022-08-02 ENCOUNTER — Emergency Department: Payer: Medicare Other

## 2022-08-02 DIAGNOSIS — G40909 Epilepsy, unspecified, not intractable, without status epilepticus: Secondary | ICD-10-CM | POA: Diagnosis present

## 2022-08-02 DIAGNOSIS — E785 Hyperlipidemia, unspecified: Secondary | ICD-10-CM | POA: Diagnosis present

## 2022-08-02 DIAGNOSIS — Z8673 Personal history of transient ischemic attack (TIA), and cerebral infarction without residual deficits: Secondary | ICD-10-CM | POA: Diagnosis present

## 2022-08-02 DIAGNOSIS — Z7982 Long term (current) use of aspirin: Secondary | ICD-10-CM | POA: Diagnosis not present

## 2022-08-02 DIAGNOSIS — I69351 Hemiplegia and hemiparesis following cerebral infarction affecting right dominant side: Secondary | ICD-10-CM | POA: Diagnosis not present

## 2022-08-02 DIAGNOSIS — E872 Acidosis, unspecified: Secondary | ICD-10-CM | POA: Diagnosis present

## 2022-08-02 DIAGNOSIS — R6520 Severe sepsis without septic shock: Secondary | ICD-10-CM | POA: Diagnosis present

## 2022-08-02 DIAGNOSIS — L89153 Pressure ulcer of sacral region, stage 3: Secondary | ICD-10-CM | POA: Diagnosis present

## 2022-08-02 DIAGNOSIS — Z1152 Encounter for screening for COVID-19: Secondary | ICD-10-CM

## 2022-08-02 DIAGNOSIS — Z86711 Personal history of pulmonary embolism: Secondary | ICD-10-CM | POA: Diagnosis not present

## 2022-08-02 DIAGNOSIS — R7881 Bacteremia: Secondary | ICD-10-CM

## 2022-08-02 DIAGNOSIS — Z993 Dependence on wheelchair: Secondary | ICD-10-CM | POA: Diagnosis not present

## 2022-08-02 DIAGNOSIS — I2699 Other pulmonary embolism without acute cor pulmonale: Secondary | ICD-10-CM | POA: Diagnosis present

## 2022-08-02 DIAGNOSIS — Z7901 Long term (current) use of anticoagulants: Secondary | ICD-10-CM | POA: Diagnosis not present

## 2022-08-02 DIAGNOSIS — Z89611 Acquired absence of right leg above knee: Secondary | ICD-10-CM | POA: Diagnosis not present

## 2022-08-02 DIAGNOSIS — Z7984 Long term (current) use of oral hypoglycemic drugs: Secondary | ICD-10-CM

## 2022-08-02 DIAGNOSIS — G9340 Encephalopathy, unspecified: Secondary | ICD-10-CM | POA: Diagnosis not present

## 2022-08-02 DIAGNOSIS — I10 Essential (primary) hypertension: Secondary | ICD-10-CM | POA: Diagnosis present

## 2022-08-02 DIAGNOSIS — L8990 Pressure ulcer of unspecified site, unspecified stage: Secondary | ICD-10-CM | POA: Insufficient documentation

## 2022-08-02 DIAGNOSIS — I639 Cerebral infarction, unspecified: Secondary | ICD-10-CM | POA: Diagnosis not present

## 2022-08-02 DIAGNOSIS — B9562 Methicillin resistant Staphylococcus aureus infection as the cause of diseases classified elsewhere: Secondary | ICD-10-CM

## 2022-08-02 DIAGNOSIS — I361 Nonrheumatic tricuspid (valve) insufficiency: Secondary | ICD-10-CM | POA: Diagnosis not present

## 2022-08-02 DIAGNOSIS — Z833 Family history of diabetes mellitus: Secondary | ICD-10-CM | POA: Diagnosis not present

## 2022-08-02 DIAGNOSIS — L89623 Pressure ulcer of left heel, stage 3: Secondary | ICD-10-CM | POA: Diagnosis present

## 2022-08-02 DIAGNOSIS — G9341 Metabolic encephalopathy: Secondary | ICD-10-CM | POA: Diagnosis present

## 2022-08-02 DIAGNOSIS — F17210 Nicotine dependence, cigarettes, uncomplicated: Secondary | ICD-10-CM | POA: Diagnosis present

## 2022-08-02 DIAGNOSIS — A4102 Sepsis due to Methicillin resistant Staphylococcus aureus: Principal | ICD-10-CM | POA: Diagnosis present

## 2022-08-02 DIAGNOSIS — J189 Pneumonia, unspecified organism: Secondary | ICD-10-CM | POA: Diagnosis present

## 2022-08-02 DIAGNOSIS — E1151 Type 2 diabetes mellitus with diabetic peripheral angiopathy without gangrene: Secondary | ICD-10-CM | POA: Diagnosis present

## 2022-08-02 DIAGNOSIS — E876 Hypokalemia: Secondary | ICD-10-CM | POA: Diagnosis present

## 2022-08-02 DIAGNOSIS — I350 Nonrheumatic aortic (valve) stenosis: Secondary | ICD-10-CM | POA: Diagnosis not present

## 2022-08-02 DIAGNOSIS — S91302A Unspecified open wound, left foot, initial encounter: Secondary | ICD-10-CM | POA: Diagnosis not present

## 2022-08-02 DIAGNOSIS — E1165 Type 2 diabetes mellitus with hyperglycemia: Secondary | ICD-10-CM | POA: Diagnosis present

## 2022-08-02 LAB — COMPREHENSIVE METABOLIC PANEL
ALT: 12 U/L (ref 0–44)
AST: 18 U/L (ref 15–41)
Albumin: 3 g/dL — ABNORMAL LOW (ref 3.5–5.0)
Alkaline Phosphatase: 72 U/L (ref 38–126)
Anion gap: 10 (ref 5–15)
BUN: 15 mg/dL (ref 6–20)
CO2: 26 mmol/L (ref 22–32)
Calcium: 8.4 mg/dL — ABNORMAL LOW (ref 8.9–10.3)
Chloride: 100 mmol/L (ref 98–111)
Creatinine, Ser: 0.53 mg/dL — ABNORMAL LOW (ref 0.61–1.24)
GFR, Estimated: >60 mL/min (ref >60)
Glucose, Bld: 236 mg/dL — ABNORMAL HIGH (ref 70–99)
Potassium: 2.9 mmol/L — ABNORMAL LOW (ref 3.5–5.1)
Sodium: 136 mmol/L (ref 135–145)
Total Bilirubin: 0.8 mg/dL (ref 0.3–1.2)
Total Protein: 8.1 g/dL (ref 6.5–8.1)

## 2022-08-02 LAB — CBC WITH DIFFERENTIAL/PLATELET
Abs Immature Granulocytes: 0.05 10*3/uL (ref 0.00–0.07)
Basophils Absolute: 0 10*3/uL (ref 0.0–0.1)
Basophils Relative: 0 %
Eosinophils Absolute: 0 10*3/uL (ref 0.0–0.5)
Eosinophils Relative: 0 %
HCT: 37.9 % — ABNORMAL LOW (ref 39.0–52.0)
Hemoglobin: 12.6 g/dL — ABNORMAL LOW (ref 13.0–17.0)
Immature Granulocytes: 0 %
Lymphocytes Relative: 6 %
Lymphs Abs: 0.8 10*3/uL (ref 0.7–4.0)
MCH: 29.3 pg (ref 26.0–34.0)
MCHC: 33.2 g/dL (ref 30.0–36.0)
MCV: 88.1 fL (ref 80.0–100.0)
Monocytes Absolute: 0.4 10*3/uL (ref 0.1–1.0)
Monocytes Relative: 3 %
Neutro Abs: 11.7 10*3/uL — ABNORMAL HIGH (ref 1.7–7.7)
Neutrophils Relative %: 91 %
Platelets: 278 10*3/uL (ref 150–400)
RBC: 4.3 MIL/uL (ref 4.22–5.81)
RDW: 12.1 % (ref 11.5–15.5)
WBC: 13 10*3/uL — ABNORMAL HIGH (ref 4.0–10.5)
nRBC: 0 % (ref 0.0–0.2)

## 2022-08-02 LAB — URINALYSIS, ROUTINE W REFLEX MICROSCOPIC
Bilirubin Urine: NEGATIVE
Glucose, UA: NEGATIVE mg/dL
Ketones, ur: NEGATIVE mg/dL
Nitrite: NEGATIVE
Protein, ur: 30 mg/dL — AB
Specific Gravity, Urine: 1.023 (ref 1.005–1.030)
Squamous Epithelial / HPF: NONE SEEN /HPF (ref 0–5)
pH: 5 (ref 5.0–8.0)

## 2022-08-02 LAB — LACTIC ACID, PLASMA
Lactic Acid, Venous: 2.5 mmol/L (ref 0.5–1.9)
Lactic Acid, Venous: 2.3 mmol/L (ref 0.5–1.9)

## 2022-08-02 LAB — CBG MONITORING, ED: Glucose-Capillary: 147 mg/dL — ABNORMAL HIGH (ref 70–99)

## 2022-08-02 LAB — PROTIME-INR
INR: 1.3 — ABNORMAL HIGH (ref 0.8–1.2)
Prothrombin Time: 16.3 seconds — ABNORMAL HIGH (ref 11.4–15.2)

## 2022-08-02 MED ORDER — LACTATED RINGERS IV SOLN: INTRAVENOUS | Status: DC

## 2022-08-02 MED ORDER — ACETAMINOPHEN 650 MG RE SUPP: 650.0000 mg | Freq: Four times a day (QID) | RECTAL | Status: DC | PRN

## 2022-08-02 MED ORDER — VANCOMYCIN HCL 1750 MG/350ML IV SOLN
1750.0000 mg | Freq: Once | INTRAVENOUS | Status: AC
  Administered 2022-08-02: 1750 mg via INTRAVENOUS
  Filled 2022-08-02: qty 350

## 2022-08-02 MED ORDER — POLYETHYLENE GLYCOL 3350 17 G PO PACK: 17.0000 g | PACK | Freq: Every day | ORAL | Status: DC | PRN

## 2022-08-02 MED ORDER — VANCOMYCIN HCL IN DEXTROSE 1-5 GM/200ML-% IV SOLN: 1000.0000 mg | Freq: Once | INTRAVENOUS | Status: DC

## 2022-08-02 MED ORDER — TAMSULOSIN HCL 0.4 MG PO CAPS
0.4000 mg | ORAL_CAPSULE | Freq: Every day | ORAL | Status: DC
  Administered 2022-08-03 – 2022-08-09 (×7): 0.4 mg via ORAL
  Filled 2022-08-02 (×7): qty 1

## 2022-08-02 MED ORDER — ACETAMINOPHEN 325 MG PO TABS: 650.0000 mg | ORAL_TABLET | Freq: Four times a day (QID) | ORAL | Status: DC | PRN

## 2022-08-02 MED ORDER — LACTATED RINGERS IV BOLUS
1000.0000 mL | Freq: Once | INTRAVENOUS | Status: AC
  Administered 2022-08-02: 1000 mL via INTRAVENOUS

## 2022-08-02 MED ORDER — INSULIN ASPART 100 UNIT/ML IJ SOLN
0.0000 [IU] | Freq: Three times a day (TID) | INTRAMUSCULAR | Status: DC
  Administered 2022-08-06 – 2022-08-09 (×3): 2 [IU] via SUBCUTANEOUS
  Filled 2022-08-02 (×3): qty 1

## 2022-08-02 MED ORDER — SODIUM CHLORIDE 0.9 % IV SOLN
2.0000 g | Freq: Once | INTRAVENOUS | Status: AC
  Administered 2022-08-02: 2 g via INTRAVENOUS
  Filled 2022-08-02: qty 12.5

## 2022-08-02 MED ORDER — INSULIN ASPART 100 UNIT/ML IJ SOLN
0.0000 [IU] | Freq: Every day | INTRAMUSCULAR | Status: DC
  Administered 2022-08-02: 0 [IU] via SUBCUTANEOUS
  Filled 2022-08-02: qty 1

## 2022-08-02 MED ORDER — SODIUM CHLORIDE 0.9 % IV SOLN
500.0000 mg | INTRAVENOUS | Status: DC
  Administered 2022-08-02 – 2022-08-03 (×2): 500 mg via INTRAVENOUS
  Filled 2022-08-02 (×2): qty 5

## 2022-08-02 MED ORDER — SODIUM CHLORIDE 0.9 % IV SOLN
3.0000 g | Freq: Four times a day (QID) | INTRAVENOUS | Status: AC
  Administered 2022-08-02 – 2022-08-07 (×19): 3 g via INTRAVENOUS
  Filled 2022-08-02 (×20): qty 8

## 2022-08-02 MED ORDER — FINASTERIDE 5 MG PO TABS
5.0000 mg | ORAL_TABLET | Freq: Every day | ORAL | Status: DC
  Administered 2022-08-04 – 2022-08-09 (×6): 5 mg via ORAL
  Filled 2022-08-02 (×6): qty 1

## 2022-08-02 MED ORDER — POTASSIUM CHLORIDE 10 MEQ/100ML IV SOLN
10.0000 meq | INTRAVENOUS | Status: AC
  Administered 2022-08-03 (×3): 10 meq via INTRAVENOUS
  Filled 2022-08-02 (×4): qty 100

## 2022-08-02 MED ORDER — LEVETIRACETAM IN NACL 500 MG/100ML IV SOLN
500.0000 mg | Freq: Two times a day (BID) | INTRAVENOUS | Status: DC
  Administered 2022-08-03 – 2022-08-08 (×12): 500 mg via INTRAVENOUS
  Filled 2022-08-02 (×12): qty 100

## 2022-08-02 MED ORDER — APIXABAN 5 MG PO TABS
5.0000 mg | ORAL_TABLET | Freq: Two times a day (BID) | ORAL | Status: DC
  Administered 2022-08-03 – 2022-08-09 (×13): 5 mg via ORAL
  Filled 2022-08-02 (×13): qty 1

## 2022-08-02 MED ORDER — SODIUM CHLORIDE 0.9% FLUSH
3.0000 mL | Freq: Two times a day (BID) | INTRAVENOUS | Status: DC
  Administered 2022-08-03 – 2022-08-09 (×11): 3 mL via INTRAVENOUS

## 2022-08-02 NOTE — Assessment & Plan Note (Signed)
This is a remote diagnosis, patient seems to be on Keppra chronically.  There is documentation of seizure disorder in the chart.  I will check Keppra level, start IV Keppra

## 2022-08-02 NOTE — Assessment & Plan Note (Addendum)
Chronic stroke causing light right hemiparesis.  With worsening lethargy today because of pneumonia.  Patient seems too somnolent right now to feed.  I will hold off on diet, monitor clinically. Restart diet in AM. I will also put swallow eval for this same reason.

## 2022-08-02 NOTE — Assessment & Plan Note (Signed)
This is a remote diagnosis, patient has also had CVA, and also has had critical limb ischemia, patient has therefore been maintained on apixaban chronically.  At this time patient home medication reconciliation is pending, however will resume apixaban.

## 2022-08-02 NOTE — Assessment & Plan Note (Signed)
Hold metformin, insulin sliding scale, home med collection pending

## 2022-08-02 NOTE — Assessment & Plan Note (Signed)
Testing as incidentally found lethargy at home, associated with sepsis today.  S/p vancomycin and cefepime in the ER.  I will treat the patient with Unasyn and azithromycin.  Follow-up blood cultures.  Patient is not hypoxic, however noted to be tachypneic, I will order some oxygen for him for comfort.  Monitor fever curve as well as white cell and FiO2 requirement

## 2022-08-02 NOTE — Progress Notes (Signed)
Pharmacy Antibiotic Note  [name] is a 62 y.o. male admitted on 08/02/2022 with potential aspiration pneumonia.  Pharmacy has been consulted for Unasyn dosing.  Plan: Unasyn 3 gm q6hr for 5 days per indication & renal fxn.  Pharmacy will continue to follow and will adjust abx dosing whenever warranted.  Temp (24hrs), Avg:99.8 F (37.7 C), Min:98.9 F (37.2 C), Max:100.7 F (38.2 C)   Recent Labs  Lab 08/02/22 1720 08/02/22 1946  WBC 13.0*  --   CREATININE 0.53*  --   LATICACIDVEN 2.5* 2.3*    Estimated Creatinine Clearance: 91.8 mL/min (A) (by C-G formula based on SCr of 0.53 mg/dL (L)).    No Known Allergies  Antimicrobials this admission: 7/10 Unasyn >> x 5 days 7/10 Azithromycin >> x 5 days 7/10 Cefepime >> x 1 dose 7/10 Vancomycin >> x 1 dose  Microbiology results: 7/10 BCx: Pending  Thank you for allowing pharmacy to be a part of this patient's care.  Otelia Sergeant, PharmD, [facility] 08/02/2022 10:05 PM

## 2022-08-02 NOTE — ED Provider Notes (Signed)
[facility] Provider Note    Event Date/Time   First MD Initiated Contact with Patient 08/02/22 1643     (approximate)   History   Weakness   HPI  [name] is a 62 y.o. male who presents to the emerged to department today from home because of PTs concern for increased lethargy.  Patient himself is unable to give any significant history.  Denies any pain however.  EMS noted patient to have a fever.     Physical Exam   Triage Vital Signs: ED Triage Vitals  Enc Vitals Group     BP 08/02/22 1652 (!) 149/84     Pulse Rate 08/02/22 1652 100     Resp 08/02/22 1652 (!) 30     Temp 08/02/22 1652 (!) 100.7 F (38.2 C)     Temp Source 08/02/22 1652 Oral     SpO2 08/02/22 1652 97 %     Weight 08/02/22 1653 174 lb 2.6 oz (79 kg)     Height 08/02/22 1653 5\' 7"  (1.702 m)     Head Circumference --      Peak Flow --      Pain Score 08/02/22 1653 0     Pain Loc --      Pain Edu? --      Excl. in GC? --     Most recent vital signs: Vitals:   08/02/22 1652  BP: (!) 149/84  Pulse: 100  Resp: (!) 30  Temp: (!) 100.7 F (38.2 C)  SpO2: 97%   General: Awake, alert, essentially non verbal. CV:  Good peripheral perfusion. Regular rate and rhythm. Resp:  Tachypnea. Lungs clear. Abd:  No distention.  Other:  Wound to left heel.   ED Results / Procedures / Treatments   Labs (all labs ordered are listed, but only abnormal results are displayed) Labs Reviewed  COMPREHENSIVE METABOLIC PANEL - Abnormal; Notable for the following components:      Result Value   Potassium 2.9 (*)    Glucose, Bld 236 (*)    Creatinine, Ser 0.53 (*)    Calcium 8.4 (*)    Albumin 3.0 (*)    All other components within normal limits  LACTIC ACID, PLASMA - Abnormal; Notable for the following components:   Lactic Acid, Venous 2.5 (*)    All other components within normal limits  CBC WITH DIFFERENTIAL/PLATELET - Abnormal; Notable for the following components:   WBC  13.0 (*)    Hemoglobin 12.6 (*)    HCT 37.9 (*)    Neutro Abs 11.7 (*)    All other components within normal limits  PROTIME-INR - Abnormal; Notable for the following components:   Prothrombin Time 16.3 (*)    INR 1.3 (*)    All other components within normal limits  CULTURE, BLOOD (ROUTINE X 2)  CULTURE, BLOOD (ROUTINE X 2)  LACTIC ACID, PLASMA  URINALYSIS, ROUTINE W REFLEX MICROSCOPIC    RADIOLOGY I independently interpreted and visualized the CXR. My interpretation: No pneumonia Radiology interpretation:  IMPRESSION:  Patchy left lung airspace disease concerning for pneumonia.    Low lung volumes.    I independently interpreted and visualized the left foot. My interpretation: no osseous erosion Radiology interpretation:  IMPRESSION:  No fracture or dislocation is seen. Osteopenia. There are no focal  lytic lesions. If there is clinical suspicion for osteomyelitis,  follow-up MRI may be considered.    Arterial calcifications in the soft tissues suggest arterio  sclerosis.      PROCEDURES:  Critical Care performed: Yes  CRITICAL CARE Performed by: Phineas Semen   Total critical care time: 30 minutes  Critical care time was exclusive of separately billable procedures and treating other patients.  Critical care was necessary to treat or prevent imminent or life-threatening deterioration.  Critical care was time spent personally by me on the following activities: development of treatment plan with patient and/or surrogate as well as nursing, discussions with consultants, evaluation of patient's response to treatment, examination of patient, obtaining history from patient or surrogate, ordering and performing treatments and interventions, ordering and review of laboratory studies, ordering and review of radiographic studies, pulse oximetry and re-evaluation of patient's condition.   Procedures    MEDICATIONS ORDERED IN ED: Medications - No data to  display   IMPRESSION / MDM / ASSESSMENT AND PLAN / ED COURSE  I reviewed the triage vital signs and the nursing notes.                              Differential diagnosis includes, but is not limited to, UTI, pneumonia, anemia  Patient's presentation is most consistent with acute presentation with potential threat to life or bodily function.   The patient is on the cardiac monitor to evaluate for evidence of arrhythmia and/or significant heart rate changes.  Patient presented to the emergency department today because of concerns for fever and weakness.  Patient is unable to give any history.  Patient has history of stroke.  Wound to left heel.  Patient was found to be febrile tachycardic and tachypneic here.  Did have concerns for infection.  Chest x-ray is concerning for pneumonia.  Blood work shows leukocytosis and lactic acidosis.  Patient was started on multiple broad-spectrum IV antibiotics.  Patient was started on IV fluids. Discussed findings with family at bedside. Discussed with Dr. Maryjean Ka with the hospitalist service who will evaluate the patient for admission.       FINAL CLINICAL IMPRESSION(S) / ED DIAGNOSES   Final diagnoses:  Pneumonia due to infectious organism, unspecified laterality, unspecified part of lung     Note:  This document was prepared using Dragon voice recognition software and may include unintentional dictation errors.    Phineas Semen, MD 08/02/22 2037
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
## 2022-08-02 NOTE — ED Triage Notes (Addendum)
Pt to ED via [facility] from home. Per EMS physical therapy came out to see pt, said he was lethargic and not normal self. Pt states feeling weak, denies any pain, SOB. EMS reports temp of 101.1. Pt also noted to have redness to Left eye and per EMS has an infection in Left leg. Pt hx of Right side stroke and R BKA.

## 2022-08-03 ENCOUNTER — Encounter: Payer: Self-pay | Admitting: Internal Medicine

## 2022-08-03 DIAGNOSIS — G9340 Encephalopathy, unspecified: Secondary | ICD-10-CM | POA: Diagnosis not present

## 2022-08-03 DIAGNOSIS — I69351 Hemiplegia and hemiparesis following cerebral infarction affecting right dominant side: Secondary | ICD-10-CM

## 2022-08-03 DIAGNOSIS — B9562 Methicillin resistant Staphylococcus aureus infection as the cause of diseases classified elsewhere: Secondary | ICD-10-CM | POA: Diagnosis not present

## 2022-08-03 DIAGNOSIS — J189 Pneumonia, unspecified organism: Secondary | ICD-10-CM | POA: Diagnosis not present

## 2022-08-03 DIAGNOSIS — E1151 Type 2 diabetes mellitus with diabetic peripheral angiopathy without gangrene: Secondary | ICD-10-CM | POA: Diagnosis not present

## 2022-08-03 DIAGNOSIS — R7881 Bacteremia: Secondary | ICD-10-CM

## 2022-08-03 LAB — RESPIRATORY PANEL BY PCR

## 2022-08-03 LAB — BLOOD CULTURE ID PANEL (REFLEXED) - BCID2

## 2022-08-03 LAB — BASIC METABOLIC PANEL
Anion gap: 6 (ref 5–15)
BUN: 11 mg/dL (ref 6–20)
CO2: 28 mmol/L (ref 22–32)
Calcium: 8.3 mg/dL — ABNORMAL LOW (ref 8.9–10.3)
Chloride: 106 mmol/L (ref 98–111)
Creatinine, Ser: 0.38 mg/dL — ABNORMAL LOW (ref 0.61–1.24)
GFR, Estimated: >60 mL/min (ref >60)
Glucose, Bld: 101 mg/dL — ABNORMAL HIGH (ref 70–99)
Potassium: 3 mmol/L — ABNORMAL LOW (ref 3.5–5.1)
Sodium: 140 mmol/L (ref 135–145)

## 2022-08-03 LAB — BLOOD GAS, ARTERIAL
Acid-Base Excess: 6.8 mmol/L — ABNORMAL HIGH (ref 0.0–2.0)
Bicarbonate: 31.3 mmol/L — ABNORMAL HIGH (ref 20.0–28.0)
FIO2: 21 %
O2 Saturation: 99 %
Patient temperature: 37
pCO2 arterial: 43 mmHg (ref 32–48)
pH, Arterial: 7.47 — ABNORMAL HIGH (ref 7.35–7.45)
pO2, Arterial: 88 mmHg (ref 83–108)

## 2022-08-03 LAB — CBC
HCT: 31.2 % — ABNORMAL LOW (ref 39.0–52.0)
Hemoglobin: 10.3 g/dL — ABNORMAL LOW (ref 13.0–17.0)
MCH: 29.3 pg (ref 26.0–34.0)
MCHC: 33 g/dL (ref 30.0–36.0)
MCV: 88.6 fL (ref 80.0–100.0)
Platelets: 215 10*3/uL (ref 150–400)
RBC: 3.52 MIL/uL — ABNORMAL LOW (ref 4.22–5.81)
RDW: 12.3 % (ref 11.5–15.5)
WBC: 9.7 10*3/uL (ref 4.0–10.5)
nRBC: 0 % (ref 0.0–0.2)

## 2022-08-03 LAB — PROTIME-INR
INR: 1.4 — ABNORMAL HIGH (ref 0.8–1.2)
Prothrombin Time: 17.4 seconds — ABNORMAL HIGH (ref 11.4–15.2)

## 2022-08-03 LAB — GLUCOSE, CAPILLARY
Glucose-Capillary: 113 mg/dL — ABNORMAL HIGH (ref 70–99)
Glucose-Capillary: 137 mg/dL — ABNORMAL HIGH (ref 70–99)
Glucose-Capillary: 147 mg/dL — ABNORMAL HIGH (ref 70–99)
Glucose-Capillary: 79 mg/dL (ref 70–99)
Glucose-Capillary: 81 mg/dL (ref 70–99)
Glucose-Capillary: 96 mg/dL (ref 70–99)

## 2022-08-03 LAB — SARS CORONAVIRUS 2 BY RT PCR: SARS Coronavirus 2 by RT PCR: NEGATIVE

## 2022-08-03 LAB — HIV ANTIBODY (ROUTINE TESTING W REFLEX): HIV Screen 4th Generation wRfx: NONREACTIVE

## 2022-08-03 LAB — APTT: aPTT: 49 seconds — ABNORMAL HIGH (ref 24–36)

## 2022-08-03 LAB — LACTIC ACID, PLASMA
Lactic Acid, Venous: 1.1 mmol/L (ref 0.5–1.9)
Lactic Acid, Venous: 1.2 mmol/L (ref 0.5–1.9)

## 2022-08-03 LAB — TROPONIN I (HIGH SENSITIVITY): Troponin I (High Sensitivity): 55 ng/L — ABNORMAL HIGH (ref <18)

## 2022-08-03 LAB — PROCALCITONIN: Procalcitonin: <0.1 ng/mL

## 2022-08-03 MED ORDER — AMLODIPINE BESYLATE 10 MG PO TABS
10.0000 mg | ORAL_TABLET | Freq: Every day | ORAL | Status: DC
  Administered 2022-08-04 – 2022-08-09 (×6): 10 mg via ORAL
  Filled 2022-08-03 (×6): qty 1

## 2022-08-03 MED ORDER — VANCOMYCIN HCL 750 MG/150ML IV SOLN
750.0000 mg | Freq: Two times a day (BID) | INTRAVENOUS | Status: DC
  Administered 2022-08-03 – 2022-08-05 (×5): 750 mg via INTRAVENOUS
  Filled 2022-08-03 (×5): qty 150

## 2022-08-03 MED ORDER — INSULIN GLARGINE-YFGN 100 UNIT/ML ~~LOC~~ SOLN
10.0000 [IU] | Freq: Every day | SUBCUTANEOUS | Status: DC
  Administered 2022-08-03: 10 [IU] via SUBCUTANEOUS
  Filled 2022-08-03 (×2): qty 0.1

## 2022-08-03 MED ORDER — LACTATED RINGERS IV SOLN: INTRAVENOUS | Status: DC

## 2022-08-03 MED ORDER — POTASSIUM CHLORIDE 10 MEQ/100ML IV SOLN
10.0000 meq | Freq: Once | INTRAVENOUS | Status: AC
  Administered 2022-08-03: 10 meq via INTRAVENOUS
  Filled 2022-08-03: qty 100

## 2022-08-03 MED ORDER — BETHANECHOL CHLORIDE 10 MG PO TABS
10.0000 mg | ORAL_TABLET | Freq: Three times a day (TID) | ORAL | Status: DC
  Administered 2022-08-03 – 2022-08-09 (×20): 10 mg via ORAL
  Filled 2022-08-03 (×21): qty 1

## 2022-08-03 MED ORDER — INSULIN GLARGINE-YFGN 100 UNIT/ML ~~LOC~~ SOLN
5.0000 [IU] | Freq: Every day | SUBCUTANEOUS | Status: DC
  Administered 2022-08-03 – 2022-08-08 (×6): 5 [IU] via SUBCUTANEOUS
  Filled 2022-08-03 (×7): qty 0.05

## 2022-08-03 MED ORDER — ASPIRIN 81 MG PO TBEC
81.0000 mg | DELAYED_RELEASE_TABLET | Freq: Every day | ORAL | Status: DC
  Administered 2022-08-04 – 2022-08-09 (×6): 81 mg via ORAL
  Filled 2022-08-03 (×6): qty 1

## 2022-08-03 MED ORDER — ATORVASTATIN CALCIUM 20 MG PO TABS
40.0000 mg | ORAL_TABLET | Freq: Every day | ORAL | Status: DC
  Administered 2022-08-03 – 2022-08-09 (×7): 40 mg via ORAL
  Filled 2022-08-03 (×7): qty 2

## 2022-08-03 MED ORDER — LACTATED RINGERS IV SOLN: INTRAVENOUS | Status: AC

## 2022-08-03 MED ORDER — VANCOMYCIN HCL IN DEXTROSE 1-5 GM/200ML-% IV SOLN
1000.0000 mg | INTRAVENOUS | Status: AC
  Administered 2022-08-03: 1000 mg via INTRAVENOUS
  Filled 2022-08-03: qty 200

## 2022-08-03 NOTE — TOC Initial Note (Signed)
Transition of Care [facility]) - Initial/Assessment Note    Patient Details  Name: [name] MRN: [id number] Date of Birth: [birth year]  Transition of Care [facility]) CM/SW Contact:    Chapman Fitch, RN Phone Number: 08/03/2022, 3:58 PM  Clinical Narrative:                  Patient A&O x1 Called placed to brother [name]     Admitted for: PNA Admitted from: home with brother [name] PCP: Jacqulyn Ducking.   Current home health/prior home health/DME: Hospital bed, WC, BSC, RW, cane  MD to order PT eval    Patient Goals and CMS Choice            Expected Discharge Plan and Services                                              Prior Living Arrangements/Services                       Activities of Daily Living Home Assistive Devices/Equipment: Wheelchair ADL Screening (condition at time of admission) Patient's cognitive ability adequate to safely complete daily activities?: No Is the patient deaf or have difficulty hearing?: Yes Does the patient have difficulty seeing, even when wearing glasses/contacts?: No Does the patient have difficulty concentrating, remembering, or making decisions?: Yes Patient able to express need for assistance with ADLs?: No Does the patient have difficulty dressing or bathing?: Yes Independently performs ADLs?: No Communication: Independent Dressing (OT): Needs assistance Is this a change from baseline?: Pre-admission baseline Grooming: Needs assistance Is this a change from baseline?: Pre-admission baseline Feeding: Needs assistance Is this a change from baseline?: Pre-admission baseline Bathing: Needs assistance Is this a change from baseline?: Pre-admission baseline Toileting: Needs assistance Is this a change from baseline?: Pre-admission baseline In/Out Bed: Needs assistance Is this a change from baseline?: Pre-admission baseline Walks in Home: Needs assistance Is this a change from baseline?: Pre-admission baseline Does  the patient have difficulty walking or climbing stairs?: Yes Weakness of Legs: Both (right BKA) Weakness of Arms/Hands: Right  Permission Sought/Granted                  Emotional Assessment              Admission diagnosis:  Pneumonia [J18.9] Pneumonia due to infectious organism, unspecified laterality, unspecified part of lung [J18.9] Patient Active Problem List   Diagnosis Date Noted   Pneumonia 08/02/2022   S/P AKA (above knee amputation), right (HCC) 12/30/2021   Protein-calorie malnutrition, severe 12/28/2021   Acute lower limb ischemia 12/26/2021   Hemiparesis affecting right side as late effect of cerebrovascular accident (CVA) (HCC) 12/26/2021   History of pulmonary embolism 12/26/2021   Chronic anticoagulation 12/26/2021   BPH (benign prostatic hyperplasia) 12/26/2021   Diastolic dysfunction 07/15/2021   Hyperlipidemia LDL goal <70 07/15/2021   Atherosclerosis of native arteries of the extremities with ulceration (HCC) 07/01/2021   Fever    Osteomyelitis (HCC)    Controlled type 2 diabetes mellitus with hyperglycemia, without long-term current use of insulin (HCC)    Pulmonary embolism (HCC) 05/06/2021   Chronic diastolic CHF (congestive heart failure) (HCC) 05/02/2021   Hyponatremia 05/02/2021   Acute urinary retention 05/01/2021   Peripheral artery disease (HCC) 04/30/2021   Foot osteomyelitis, right (HCC) 04/27/2021   Open wound of right  foot 04/26/2021   Generalized weakness 04/26/2021   Acute pulmonary embolism (HCC) 04/25/2021   Elevated troponin    CVA (cerebral vascular accident) (HCC) 04/23/2021   Seizure disorder (HCC) 09/16/2018   Essential hypertension 09/14/2018   TIA (transient ischemic attack) 09/14/2018   Right sided weakness 09/14/2018   Stroke (HCC) 01/27/2018   Ataxia 01/26/2018   Hypertensive urgency 01/26/2018   Uncontrolled type 2 diabetes mellitus with hyperglycemia, without long-term current use of insulin (HCC) 01/26/2018    Hypokalemia 01/26/2018   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 [place] [address] Phone: [phone] Fax: [phone]  [facility] [address]), [place] - [address] [place]) [place] [address] Phone: [phone] Fax: [phone]     Social Determinants of Health (SDOH) Social History: SDOH Screenings   Food Insecurity: No Food Insecurity (08/03/2022)  Housing: Low Risk  (08/03/2022)  Transportation Needs: No Transportation Needs (08/03/2022)  Utilities: Not At Risk (08/03/2022)  Tobacco Use: High Risk (08/03/2022)   SDOH Interventions:     Readmission Risk Interventions     No data to display

## 2022-08-03 NOTE — Progress Notes (Addendum)
Pharmacy Antibiotic Note  [name] is a 62 y.o. male admitted on 08/02/2022 with bacteremia.  Pharmacy has been consulted for Vancomycin dosing. Patient noted to have MRSA bacteremia 7/11.  Source unclear at this time but noted to have sacral and L heel wound.  He has PMH PAD with R AKA.  Currently on antibiotics for possible pneumonia.    Today, 08/03/2022 Day #1 antibiotics Renal: SCr 0.38 WBC WNL Tm 100.7 Vancomycin 1750mg  IV x 1 given 7/10 at 19:35 7/11 due to discrepancy (currently listed at 60kg but previously 79kg in June) in recent weights,  asked to reweigh patient and using mechanical lift scale his weight is 58.5 kg  Plan: Vancomycin 1gm IV x 1 now (ordered pending clarification) then 750mg  IV q12h  Estimated AUC = 469 (trough = 14) Goal AUC 400-600 Used  SCr 0.8 mg/dl, TBW (as TBW < IBW) and Vd 0.72 L/kg Monitor renal function Check vancomycin levels as appropriate Request daptomycin MIC be sent to [facility] ID consulted  Height: 5\' 7"  (170.2 cm) Weight: 60 kg (132 lb 4.4 oz) IBW/kg (Calculated) : 66.1  Temp (24hrs), Avg:99 F (37.2 C), Min:98.2 F (36.8 C), Max:100.7 F (38.2 C)  Recent Labs  Lab 08/02/22 1720 08/02/22 1946 08/02/22 2339 08/03/22 0028 08/03/22 0458  WBC 13.0*  --   --   --  9.7  CREATININE 0.53*  --   --   --  0.38*  LATICACIDVEN 2.5* 2.3* 1.1 1.2  --     Estimated Creatinine Clearance: 83.3 mL/min (A) (by C-G formula based on SCr of 0.38 mg/dL (L)).    No Known Allergies  Antimicrobials this admission: 7/10 vanc x 1 7/10 cefepime x 1 7/10 amp/sulb >> 7/10 azith >>  Dose adjustments this admission:  Microbiology results: 7/10 BCx: GPC, BCID MRSA   Thank you for allowing pharmacy to be a part of this patient's care.  Juliette Alcide, PharmD, BCPS, BCIDP Work Cell: [phone] 08/03/2022 9:40 AM
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
## 2022-08-03 NOTE — Progress Notes (Signed)
  PROGRESS NOTE    [name]  ZOX:[id number] DOB: 1960/08/07 DOA: 08/02/2022 PCP: Marya Fossa, PA-C  215A/215A-AA  LOS: 1 day   Brief hospital course:   Assessment & Plan: [name] is a 62 y.o. male with medical history significant of prior/remote stroke, right hemiparesis.  Patient also has a peripheral arterial disease, remote catheterization of the right lower extremity, patient has also had an amputation of the right lower extremity.  Patient was seen by physical therapy at home today and was noted to be markedly lethargic/somnolent.  EMS was called who subsequently found the patient to be febrile.    Severe sepsis --fever, leukocytosis on presentation, with elevated lactic acid.  Source, MRSA bacteremia.  MRSA bacteremia --possible from pressure ulcers --start IV Vanc --ID consult  * Possible Pneumonia --No hypoxia.  CXR showed "Patchy left lung airspace disease concerning for pneumonia." --started on azithro and Unasyn on admission --cont azithro and Unasyn pending ID consult  Hx of PE This is a remote diagnosis, patient has also had CVA, and also has had critical limb ischemia, patient has therefore been maintained on apixaban chronically.   --cont Eliquis  Hx of CVA (cerebral vascular accident) [facility]) This is a remote diagnosis, patient seems to be on Keppra chronically.  There is documentation of seizure disorder in the chart.   --cont Keppra as IV for now until mental status improves and better oral intake  type 2 diabetes mellitus  Hold metformin --reduce glargine to 5u nightly --ACHS and SSI  Hypokalemia --monitor and replete PRN   DVT prophylaxis: [id number] Code Status: Full code  Family Communication: sister updated on the phone today Level of care: Telemetry Medical Dispo:   The patient is from: home Anticipated d/c is to: home (sister said pt refuses SNF) Anticipated d/c date is: >3 days Patient currently is not medically ready to d/c  due to: on IV vanc for MRSA bacteremia   Subjective and Interval History:  Pt was sleeping most of the time, but 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     Objective: Vitals:   08/03/22 0842 08/03/22 0944 08/03/22 1345 08/03/22 1654  BP: (!) 153/78  133/71 126/70  Pulse: 68  70 64  Resp:      Temp: 98.2 F (36.8 C)  97.9 F (36.6 C) 98.3 F (36.8 C)  TempSrc: Axillary  Oral Axillary  SpO2: 100%  100% 100%  Weight:  58.5 kg    Height:        Intake/Output Summary (Last 24 hours) at 08/03/2022 1933 Last data filed at 08/03/2022 1657 Gross per 24 hour  Intake 4622.3 ml  Output 900 ml  Net 3722.3 ml   Filed Weights   08/02/22 1653 08/03/22 0003 08/03/22 0944  Weight: 79 kg 60 kg 58.5 kg    Examination:   Constitutional: NAD, sleepy but arousable CV: No cyanosis.   RESP: normal respiratory effort, on RA Extremities: right AKA, foams dressing over left heel. SKIN: warm, dry   Data Reviewed: I have personally reviewed labs and imaging studies  Time spent: 50 minutes  Darlin Priestly, MD [facility] If 7PM-7AM, please contact night-coverage 08/03/2022, 7:33 PM

## 2022-08-03 NOTE — Progress Notes (Signed)
PHARMACY - PHYSICIAN COMMUNICATION CRITICAL VALUE ALERT - BLOOD CULTURE IDENTIFICATION (BCID)  [name] is an 62 y.o. male who presented to [facility] on 08/02/2022 with a chief complaint of lethargic, fever.   Assessment:  Blood cultures from 7/10 currently with GPC in 1 of 4 bottles, BCID detects MRSA.  Appears patient has sacral and heel wound.    Name of physician (or Provider) Contacted: Drs Rivka Safer and Fran Lowes  Current antibiotics: ampicillin/sulbactam and azithromycin   Changes to prescribed antibiotics recommended:  Recommendations accepted by provider - auto ID consult.  Vancomycin resumed.    Results for orders placed or performed during the hospital encounter of 08/02/22  Blood Culture ID Panel (Reflexed) (Collected: 08/02/2022  5:20 PM)  Result Value Ref Range   Enterococcus faecalis NOT DETECTED NOT DETECTED   Enterococcus Faecium NOT DETECTED NOT DETECTED   Listeria monocytogenes NOT DETECTED NOT DETECTED   Staphylococcus species DETECTED (A) NOT DETECTED   Staphylococcus aureus (BCID) DETECTED (A) NOT DETECTED   Staphylococcus epidermidis NOT DETECTED NOT DETECTED   Staphylococcus lugdunensis NOT DETECTED NOT DETECTED   Streptococcus species NOT DETECTED NOT DETECTED   Streptococcus agalactiae NOT DETECTED NOT DETECTED   Streptococcus pneumoniae NOT DETECTED NOT DETECTED   Streptococcus pyogenes NOT DETECTED NOT DETECTED   A.calcoaceticus-baumannii NOT DETECTED NOT DETECTED   Bacteroides fragilis NOT DETECTED NOT DETECTED   Enterobacterales NOT DETECTED NOT DETECTED   Enterobacter cloacae complex NOT DETECTED NOT DETECTED   Escherichia coli NOT DETECTED NOT DETECTED   Klebsiella aerogenes NOT DETECTED NOT DETECTED   Klebsiella oxytoca NOT DETECTED NOT DETECTED   Klebsiella pneumoniae NOT DETECTED NOT DETECTED   Proteus species NOT DETECTED NOT DETECTED   Salmonella species NOT DETECTED NOT DETECTED   Serratia marcescens NOT DETECTED NOT DETECTED    Haemophilus influenzae NOT DETECTED NOT DETECTED   Neisseria meningitidis NOT DETECTED NOT DETECTED   Pseudomonas aeruginosa NOT DETECTED NOT DETECTED   Stenotrophomonas maltophilia NOT DETECTED NOT DETECTED   Candida albicans NOT DETECTED NOT DETECTED   Candida auris NOT DETECTED NOT DETECTED   Candida glabrata NOT DETECTED NOT DETECTED   Candida krusei NOT DETECTED NOT DETECTED   Candida parapsilosis NOT DETECTED NOT DETECTED   Candida tropicalis NOT DETECTED NOT DETECTED   Cryptococcus neoformans/gattii NOT DETECTED NOT DETECTED   Meth resistant mecA/C and MREJ DETECTED (A) NOT DETECTED    Juliette Alcide, PharmD, BCPS, BCIDP Work Cell: [phone] 08/03/2022 9:04 AM

## 2022-08-03 NOTE — Plan of Care (Signed)
  Problem: Nutritional: Goal: Maintenance of adequate nutrition will improve Outcome: Progressing   Problem: Safety: Goal: Ability to remain free from injury will improve Outcome: Progressing   Problem: Skin Integrity: Goal: Risk for impaired skin integrity will decrease Outcome: Progressing

## 2022-08-03 NOTE — Progress Notes (Signed)
Spoke with sister, Sanford Lindblad [phone]. Update given in regards to current patient status. Per [name], she is the primary decision maker with patient's care. No advanced directive created. Consulted chaplain department for AD creation with Miss Prabhu.   Madie Reno, RN
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
## 2022-08-03 NOTE — Consult Note (Signed)
WOC Nurse Consult Note: Reason for Consult:Patient with known PAD and right AKA. Sacral Stage 3 PI and left heel wound. He has been followed by Vascular Surgery, Podiatric Medicine and outpatient wound (for the right foot) in the past. Wound type:pressure, full thickness Pressure Injury POA: Yes Measurement:Bedside RN to measure and document measurements of wounds on Nursing Flow Sheet today with application of next dressing Wound bed:Per nursing flow sheet Drainage (amount, consistency, odor) (Both) scant, serosanguinous Periwound:intact Dressing procedure/placement/frequency:I have provided conservative care guidance for both wounds using a daily cleanse and dry followed by placement of an antimicrobial nonadherent gauze (xeroform). This is to be secured with silicone foam dressings. Heel is to be floated using a Prevalon boot. Turning is in place for the sacral area off loading. A pressure redistribution chair cushion is provided for OOB use.  WOC nursing team will not follow, but will remain available to this patient, the nursing and medical teams.  Please re-consult if needed.  Thank you for inviting [place] to participate in this patient's Plan of Care.  Ladona Mow, MSN, RN, CNS, GNP, Leda Min, [employer], [employer] phone:  [phone]

## 2022-08-04 DIAGNOSIS — E1151 Type 2 diabetes mellitus with diabetic peripheral angiopathy without gangrene: Secondary | ICD-10-CM | POA: Diagnosis not present

## 2022-08-04 DIAGNOSIS — B9562 Methicillin resistant Staphylococcus aureus infection as the cause of diseases classified elsewhere: Secondary | ICD-10-CM | POA: Diagnosis not present

## 2022-08-04 DIAGNOSIS — J189 Pneumonia, unspecified organism: Secondary | ICD-10-CM | POA: Diagnosis not present

## 2022-08-04 DIAGNOSIS — G9340 Encephalopathy, unspecified: Secondary | ICD-10-CM | POA: Diagnosis not present

## 2022-08-04 LAB — BASIC METABOLIC PANEL
Anion gap: 7 (ref 5–15)
BUN: 8 mg/dL (ref 6–20)
CO2: 28 mmol/L (ref 22–32)
Calcium: 8.2 mg/dL — ABNORMAL LOW (ref 8.9–10.3)
Chloride: 103 mmol/L (ref 98–111)
Creatinine, Ser: 0.37 mg/dL — ABNORMAL LOW (ref 0.61–1.24)
GFR, Estimated: >60 mL/min (ref >60)
Glucose, Bld: 76 mg/dL (ref 70–99)
Potassium: 2.6 mmol/L — CL (ref 3.5–5.1)
Sodium: 138 mmol/L (ref 135–145)

## 2022-08-04 LAB — GLUCOSE, CAPILLARY
Glucose-Capillary: 71 mg/dL (ref 70–99)
Glucose-Capillary: 89 mg/dL (ref 70–99)
Glucose-Capillary: 130 mg/dL — ABNORMAL HIGH (ref 70–99)
Glucose-Capillary: 194 mg/dL — ABNORMAL HIGH (ref 70–99)

## 2022-08-04 LAB — CBC
HCT: 28.8 % — ABNORMAL LOW (ref 39.0–52.0)
Hemoglobin: 9.8 g/dL — ABNORMAL LOW (ref 13.0–17.0)
MCH: 29.4 pg (ref 26.0–34.0)
MCHC: 34 g/dL (ref 30.0–36.0)
MCV: 86.5 fL (ref 80.0–100.0)
Platelets: 203 10*3/uL (ref 150–400)
RBC: 3.33 MIL/uL — ABNORMAL LOW (ref 4.22–5.81)
RDW: 12.1 % (ref 11.5–15.5)
WBC: 6.2 10*3/uL (ref 4.0–10.5)
nRBC: 0 % (ref 0.0–0.2)

## 2022-08-04 LAB — HEMOGLOBIN A1C
Hgb A1c MFr Bld: 6.4 % — ABNORMAL HIGH (ref 4.8–5.6)
Mean Plasma Glucose: 137 mg/dL

## 2022-08-04 LAB — MAGNESIUM: Magnesium: 1.8 mg/dL (ref 1.7–2.4)

## 2022-08-04 LAB — MRSA NEXT GEN BY PCR, NASAL: MRSA by PCR Next Gen: DETECTED — AB

## 2022-08-04 LAB — LEVETIRACETAM LEVEL: Levetiracetam Lvl: 10.3 ug/mL (ref 10.0–40.0)

## 2022-08-04 MED ORDER — POTASSIUM CHLORIDE 20 MEQ PO PACK: 40.0000 meq | PACK | Freq: Once | ORAL | Status: DC

## 2022-08-04 MED ORDER — POTASSIUM CHLORIDE CRYS ER 20 MEQ PO TBCR
40.0000 meq | EXTENDED_RELEASE_TABLET | Freq: Once | ORAL | Status: AC
  Administered 2022-08-04: 40 meq via ORAL
  Filled 2022-08-04: qty 2

## 2022-08-04 MED ORDER — MUPIROCIN 2 % EX OINT
1.0000 | TOPICAL_OINTMENT | Freq: Two times a day (BID) | CUTANEOUS | Status: AC
  Administered 2022-08-04 – 2022-08-08 (×10): 1 via NASAL
  Filled 2022-08-04: qty 22

## 2022-08-04 MED ORDER — POTASSIUM CHLORIDE 10 MEQ/100ML IV SOLN
10.0000 meq | INTRAVENOUS | Status: AC
  Administered 2022-08-04 (×3): 10 meq via INTRAVENOUS
  Filled 2022-08-04: qty 100

## 2022-08-04 MED ORDER — CHLORHEXIDINE GLUCONATE CLOTH 2 % EX PADS
6.0000 | MEDICATED_PAD | Freq: Every day | CUTANEOUS | Status: AC
  Administered 2022-08-04 – 2022-08-08 (×4): 6 via TOPICAL

## 2022-08-04 NOTE — Progress Notes (Signed)
Patient more awake today and able to answer questions appropriately. Patient's intake has been poor PTA, however, patient communicated to RN that he is hungry, but does not want not like the pureed diet or thickened liquid. Patient states he eats regular food at home and feeds himself. RN advised patient will need to consult MD for diet change/swallow reevaluation.  Madie Reno, RN

## 2022-08-04 NOTE — Progress Notes (Signed)
Pharmacy Antibiotic Note  [name] is a 62 y.o. male admitted on 08/02/2022 with bacteremia.  Pharmacy has been consulted for Vancomycin dosing. Patient noted to have MRSA bacteremia 7/11.  Source unclear at this time but noted to have sacral and L heel wound.  He has PMH PAD with R AKA.  Currently on antibiotics for possible pneumonia.    Today, 08/04/2022 Day #2 antibiotics - vancomycin also on ampicillin/sulbactam (x5 days) and azithromycin (x 3 days) for CAP Renal: SCr 0.37 - stable WBC WNL Afebrile last 24h Vancomycin 1750mg  IV x 1 given 7/10 at 19:35 7/11 due to discrepancy (currently listed at 60kg but previously 79kg in June) in recent weights,  asked to reweigh patient and using mechanical lift scale his weight is 58.5 kg  Plan: Continue Vancomycin 750mg  IV q12h  Estimated AUC = 469 (trough = 14) Goal AUC 400-600 Used  SCr 0.8 mg/dl, TBW (as TBW < IBW) and Vd 0.72 L/kg Monitor renal function Check vancomycin levels as appropriate - likely get trough this weekend Request daptomycin MIC be sent to [facility] ID consulted  Height: 5\' 7"  (170.2 cm) Weight: 58.5 kg (128 lb 15.5 oz) IBW/kg (Calculated) : 66.1  Temp (24hrs), Avg:98.4 F (36.9 C), Min:97.9 F (36.6 C), Max:98.8 F (37.1 C)  Recent Labs  Lab 08/02/22 1720 08/02/22 1946 08/02/22 2339 08/03/22 0028 08/03/22 0458 08/04/22 0519  WBC 13.0*  --   --   --  9.7 6.2  CREATININE 0.53*  --   --   --  0.38* 0.37*  LATICACIDVEN 2.5* 2.3* 1.1 1.2  --   --     Estimated Creatinine Clearance: 81.3 mL/min (A) (by C-G formula based on SCr of 0.37 mg/dL (L)).    No Known Allergies  Antimicrobials this admission: 7/10 vanc >> 7/10 cefepime x 1 7/10 amp/sulb >> (7/15) 7/10 azith >> (7/12)  Dose adjustments this admission:  Microbiology results: 7/10 BCx: GPC, BCID MRSA   Thank you for allowing pharmacy to be a part of this patient's care.  Juliette Alcide, PharmD, BCPS, BCIDP Work Cell:  [phone] 08/04/2022 10:58 AM

## 2022-08-04 NOTE — Progress Notes (Signed)
   08/04/22 1400  Spiritual Encounters  Type of Visit Initial  Care provided to: Pt and family  Referral source Family  Reason for visit Advance directives  OnCall Visit Yes  Spiritual Care Plan  Spiritual Care Issues Still Outstanding Referring to oncoming chaplain for further support (Per request, patients sister to come on Tuesday 7/16 for the completion of Advanced Directive.)   Chaplain visited with patient regarding AD. Patient requests refer to sister [name] on this topic. Per spiritual consult note, request was made to contact sister. Chaplain called and spoke with sister and she works night shift. Her first opportunity to be present in helping patient in completing these forms is Tuesday 7/16.  Made patient aware we would try our best to help complete the process, but  Notary services are not guaranteed during the time of her visit.

## 2022-08-04 NOTE — Progress Notes (Signed)
Date of Admission:  08/02/2022     ID: [name] is a 62 y.o. male  Principal Problem:   Pneumonia Active Problems:   Uncontrolled type 2 diabetes mellitus with hyperglycemia, without long-term current use of insulin (HCC)   Stroke (HCC)   CVA (cerebral vascular accident) (HCC)   Acute pulmonary embolism (HCC)    Subjective: Pt doing better  Medications:   amLODipine  10 mg Oral Daily   apixaban  5 mg Oral BID   aspirin EC  81 mg Oral Daily   atorvastatin  40 mg Oral q1800   bethanechol  10 mg Oral TID   Chlorhexidine Gluconate Cloth  6 each Topical Q0600   finasteride  5 mg Oral Daily   insulin aspart  0-15 Units Subcutaneous TID WC   insulin aspart  0-5 Units Subcutaneous QHS   insulin glargine-yfgn  5 Units Subcutaneous QHS   mupirocin ointment  1 Application Nasal BID   potassium chloride  40 mEq Oral Once   sodium chloride flush  3 mL Intravenous Q12H   tamsulosin  0.4 mg Oral QPC supper    Objective: Vital signs in last 24 hours: Patient Vitals for the past 24 hrs:  BP Temp Temp src Pulse Resp SpO2  08/04/22 0752 138/77 98.6 F (37 C) Oral 73 -- 100 %  08/04/22 0409 138/72 98.3 F (36.8 C) Oral 70 18 100 %  08/03/22 2042 134/71 98.8 F (37.1 C) -- 71 18 99 %  08/03/22 1654 126/70 98.3 F (36.8 C) Axillary 64 -- 100 %  08/03/22 1345 133/71 97.9 F (36.6 C) Oral 70 -- 100 %      PHYSICAL EXAM:  General: Alert, cooperative, no distress, oriented in person, place Lungs: b/l air entry  No Wheezing or Rhonchi. few Heart: Regular rate and rhythm, no murmur, rub or gallop. Abdomen: Soft, non-tender,not distended. Bowel sounds normal. No masses Extremities: atraumatic, no cyanosis. No edema. No clubbing Skin: No rashes or lesions. Or bruising Lymph: Cervical, supraclavicular normal. Neurologic: rt hemiparesis  Lab Results    Latest Ref Rng & Units 08/04/2022    5:19 AM 08/03/2022    4:58 AM 08/02/2022    5:20 PM  CBC  WBC 4.0 - 10.5 K/uL 6.2  9.7   13.0   Hemoglobin 13.0 - 17.0 g/dL 9.8  16.1  09.6   Hematocrit 39.0 - 52.0 % 28.8  31.2  37.9   Platelets 150 - 400 K/uL 203  215  278        Latest Ref Rng & Units 08/04/2022    5:19 AM 08/03/2022    4:58 AM 08/02/2022    5:20 PM  CMP  Glucose 70 - 99 mg/dL 76  045  409   BUN 6 - 20 mg/dL 8  11  15    Creatinine 0.61 - 1.24 mg/dL 8.11  9.14  7.82   Sodium 135 - 145 mmol/L 138  140  136   Potassium 3.5 - 5.1 mmol/L 2.6  3.0  2.9   Chloride 98 - 111 mmol/L 103  106  100   CO2 22 - 32 mmol/L 28  28  26    Calcium 8.9 - 10.3 mg/dL 8.2  8.3  8.4   Total Protein 6.5 - 8.1 g/dL   8.1   Total Bilirubin 0.3 - 1.2 mg/dL   0.8   Alkaline Phos 38 - 126 U/L   72   AST 15 - 41 U/L   18  ALT 0 - 44 U/L   12       Microbiology:  Studies/Results: DG Foot Complete Left  Result Date: 08/02/2022 CLINICAL DATA:  Skin wound in the heel EXAM: LEFT FOOT - COMPLETE 3+ VIEW COMPARISON:  None Available. FINDINGS: No displaced fracture or dislocation is seen. Osteopenia is seen involving structures. There are no focal lytic lesions. Arterial calcifications are seen in soft tissues. IMPRESSION: No fracture or dislocation is seen. Osteopenia. There are no focal lytic lesions. If there is clinical suspicion for osteomyelitis, follow-up MRI may be considered. 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 Electronically Signed   By: Ernie Avena M.D.   On: 08/02/2022 17:53   DG Chest 2 View  Result Date: 08/02/2022 CLINICAL DATA:  Fever, altered mental status EXAM: CHEST - 2 VIEW COMPARISON:  12/30/2021 FINDINGS: Low lung volumes. Patchy airspace disease in the left mid and lower lung. Right lung clear. No effusions. Heart mediastinal contours within normal limits. No acute bony abnormality. IMPRESSION: Patchy left lung airspace disease concerning for pneumonia. Low lung volumes. Electronically Signed   By: Charlett Nose M.D.   On: 08/02/2022 17:52      Impression/Recommendation ? Encephalopathy Much improved Could be related to infection   MRSA bacteremia Source could be the left heel wound. culture from the wound pending  repeat blood culture  need 2D echo Possible TEE   Left lower lobe infiltrate MRSA nares swab+   Patient is currently on Unasyn, Zithromax and vancomycin. Dc zithromax   History of right AKA   History of PAD in June he underwent angioplasty and stent placement in the left SFA, tibioperoneal trunk and posterior tibial.   Diabetes   History of CVA with right hemiparesis   ID will follow him peripherally this weekend [facility] on call this weekend.

## 2022-08-04 NOTE — NC FL2 (Signed)
[place] MEDICAID FL2 LEVEL OF CARE FORM     IDENTIFICATION  Patient Name: [name] Birthdate: Aug 04, 1960 Sex: male Admission Date (Current Location): 08/02/2022  [facility] and [place] Number:  Chiropodist and Address:         Provider Number: [id number]  Attending Physician Name and Address:  Willeen Niece, MD  Relative Name and Phone Number:       Current Level of Care: Hospital Recommended Level of Care: Skilled Nursing Facility Prior Approval Number:    Date Approved/Denied:   PASRR Number: [id number]  Discharge Plan: SNF    Current Diagnoses: Patient Active Problem List   Diagnosis Date Noted   Pneumonia 08/02/2022   S/P AKA (above knee amputation), right (HCC) 12/30/2021   Protein-calorie malnutrition, severe 12/28/2021   Acute lower limb ischemia 12/26/2021   Hemiparesis affecting right side as late effect of cerebrovascular accident (CVA) (HCC) 12/26/2021   History of pulmonary embolism 12/26/2021   Chronic anticoagulation 12/26/2021   BPH (benign prostatic hyperplasia) 12/26/2021   Diastolic dysfunction 07/15/2021   Hyperlipidemia LDL goal <70 07/15/2021   Atherosclerosis of native arteries of the extremities with ulceration (HCC) 07/01/2021   Fever    Osteomyelitis (HCC)    Controlled type 2 diabetes mellitus with hyperglycemia, without long-term current use of insulin (HCC)    Pulmonary embolism (HCC) 05/06/2021   Chronic diastolic CHF (congestive heart failure) (HCC) 05/02/2021   Hyponatremia 05/02/2021   Acute urinary retention 05/01/2021   Peripheral artery disease (HCC) 04/30/2021   Foot osteomyelitis, right (HCC) 04/27/2021   Open wound of right foot 04/26/2021   Generalized weakness 04/26/2021   Acute pulmonary embolism (HCC) 04/25/2021   Elevated troponin    CVA (cerebral vascular accident) (HCC) 04/23/2021   Seizure disorder (HCC) 09/16/2018   Essential hypertension 09/14/2018   TIA (transient ischemic attack)  09/14/2018   Right sided weakness 09/14/2018   Stroke (HCC) 01/27/2018   Ataxia 01/26/2018   Hypertensive urgency 01/26/2018   Uncontrolled type 2 diabetes mellitus with hyperglycemia, without long-term current use of insulin (HCC) 01/26/2018   Hypokalemia 01/26/2018   Toe gangrene (HCC) 10/30/2014    Orientation RESPIRATION BLADDER Height & Weight     Self, Situation  Normal Incontinent Weight: 58.5 kg Height:  5\' 7"  (170.2 cm)  BEHAVIORAL SYMPTOMS/MOOD NEUROLOGICAL BOWEL NUTRITION STATUS      Incontinent Diet (dys 1)  AMBULATORY STATUS COMMUNICATION OF NEEDS Skin   Total Care Verbally PU Stage and Appropriate Care (Dehisced area on heel)                       Personal Care Assistance Level of Assistance              Functional Limitations Info             SPECIAL CARE FACTORS FREQUENCY  PT (By licensed PT), OT (By licensed OT)                    Contractures Contractures Info: Not present    Additional Factors Info  Code Status, Allergies Code Status Info: Full Allergies Info: NKDA           Current Medications (08/04/2022):  This is the current hospital active medication list Current Facility-Administered Medications  Medication Dose Route Frequency Provider Last Rate Last Admin   acetaminophen (TYLENOL) tablet 650 mg  650 mg Oral Q6H PRN Nolberto Hanlon, MD       Or  acetaminophen (TYLENOL) suppository 650 mg  650 mg Rectal Q6H PRN Nolberto Hanlon, MD       amLODipine (NORVASC) tablet 10 mg  10 mg Oral Daily Nolberto Hanlon, MD   10 mg at 08/04/22 0854   Ampicillin-Sulbactam (UNASYN) 3 g in sodium chloride 0.9 % 100 mL IVPB  3 g Intravenous Q6H Nolberto Hanlon, MD 200 mL/hr at 08/04/22 1520 3 g at 08/04/22 1520   apixaban (ELIQUIS) tablet 5 mg  5 mg Oral BID Nolberto Hanlon, MD   5 mg at 08/04/22 0854   aspirin EC tablet 81 mg  81 mg Oral Daily Nolberto Hanlon, MD   81 mg at 08/04/22 0854   atorvastatin (LIPITOR) tablet 40 mg  40 mg Oral q1800 Nolberto Hanlon, MD   40  mg at 08/03/22 1857   bethanechol (URECHOLINE) tablet 10 mg  10 mg Oral TID Nolberto Hanlon, MD   10 mg at 08/04/22 0853   Chlorhexidine Gluconate Cloth 2 % PADS 6 each  6 each Topical Q0600 Lynn Ito, MD   6 each at 08/04/22 0412   finasteride (PROSCAR) tablet 5 mg  5 mg Oral Daily Nolberto Hanlon, MD   5 mg at 08/04/22 0854   insulin aspart (novoLOG) injection 0-15 Units  0-15 Units Subcutaneous TID WC Nolberto Hanlon, MD       insulin aspart (novoLOG) injection 0-5 Units  0-5 Units Subcutaneous QHS Nolberto Hanlon, MD   0 Units at 08/02/22 2224   insulin glargine-yfgn (SEMGLEE) injection 5 Units  5 Units Subcutaneous QHS Darlin Priestly, MD   5 Units at 08/03/22 2123   lactated ringers infusion   Intravenous Continuous Darlin Priestly, MD   Stopped at 08/04/22 1520   levETIRAcetam (KEPPRA) IVPB 500 mg/100 mL premix  500 mg Intravenous Q12H Nolberto Hanlon, MD 400 mL/hr at 08/04/22 1448 500 mg at 08/04/22 1448   mupirocin ointment (BACTROBAN) 2 % 1 Application  1 Application Nasal BID Lynn Ito, MD   1 Application at 08/04/22 0856   polyethylene glycol (MIRALAX / GLYCOLAX) packet 17 g  17 g Oral Daily PRN Nolberto Hanlon, MD       sodium chloride flush (NS) 0.9 % injection 3 mL  3 mL Intravenous Q12H Nolberto Hanlon, MD   3 mL at 08/04/22 0848   tamsulosin (FLOMAX) capsule 0.4 mg  0.4 mg Oral QPC supper Nolberto Hanlon, MD   0.4 mg at 08/03/22 1857   vancomycin (VANCOREADY) IVPB 750 mg/150 mL  750 mg Intravenous Q12H Aleda Grana, RPH 150 mL/hr at 08/04/22 1521 750 mg at 08/04/22 1521     Discharge Medications: Please see discharge summary for a list of discharge medications.  Relevant Imaging Results:  Relevant Lab Results:   Additional Information SSN: [id number]  Chapman Fitch, RN

## 2022-08-04 NOTE — Plan of Care (Signed)
  Problem: Nutritional: Goal: Maintenance of adequate nutrition will improve Outcome: Progressing   Problem: Safety: Goal: Ability to remain free from injury will improve Outcome: Progressing   Problem: Skin Integrity: Goal: Risk for impaired skin integrity will decrease Outcome: Progressing   

## 2022-08-04 NOTE — Evaluation (Signed)
Physical Therapy Evaluation Patient Details Name: [name] MRN: [id number] DOB: 03-09-1960 Today's Date: 08/04/2022  History of Present Illness  62 y.o old male with medical history significant of prior/remote stroke, right hemiparesis.  Patient also has a peripheral arterial disease, remote catheterization of the right lower extremity, R AKA. Pt admitted for severe sepsis and MRSA  Clinical Impression  Pt is a pleasant 62 year old male who was admitted for pneumonia. Pt performs bed mobility max a, heavy multimodal cueing needed for initiation and reaching for rail. Pts bed was wet due to purewick malfunction and pt had a small BM. Max a +2 for rolling with hygiene. NT replaced sacral pad and purewick. Pt demonstrates deficits with strength, mobility, balance, ROM, strength, cognition, safe DME use, transfers and pain. Throughout session Pt was Aox2 for person and place, could not state the month and year. Pt will continue to receive skilled PT services while admitted and will defer to TOC/care team for updates regarding disposition planning.       Assistance Recommended at Discharge Frequent or constant Supervision/Assistance  If plan is discharge home, recommend the following:  Can travel by private vehicle  Two people to help with walking and/or transfers;A lot of help with bathing/dressing/bathroom;Assistance with cooking/housework;Assistance with feeding;Direct supervision/assist for medications management;Assist for transportation;Help with stairs or ramp for entrance   No    Equipment Recommendations None recommended by PT  Recommendations for Other Services  OT consult    Functional Status Assessment Patient has had a recent decline in their functional status and/or demonstrates limited ability to make significant improvements in function in a reasonable and predictable amount of time     Precautions / Restrictions Precautions Precautions: Fall Restrictions Weight  Bearing Restrictions: No      Mobility  Bed Mobility Overal bed mobility: Needs Assistance Bed Mobility: Rolling Rolling: Max assist         General bed mobility comments: Max assist for cueing and physical assist b/l. Max +2 for hygeine and rolling.    Transfers                   General transfer comment: unable to assess due to patient refusal.    Ambulation/Gait               General Gait Details: Pt does not walk at baseline  Stairs            Wheelchair Mobility     Tilt Bed    Modified Rankin (Stroke Patients Only)       Balance                                             Pertinent Vitals/Pain Pain Assessment Pain Assessment: 0-10 Pain Score: 4  Pain Location: sacral wound Pain Descriptors / Indicators: Burning, Aching, Grimacing    Home Living Family/patient expects to be discharged to:: Private residence Living Arrangements: Other relatives Available Help at Discharge: Family;Available 24 hours/day;Available PRN/intermittently Type of Home: Apartment Home Access: Level entry       Home Layout: One level Home Equipment: Agricultural consultant (2 wheels);Wheelchair - manual;Shower seat;Hospital bed      Prior Function Prior Level of Function : Needs assist       Physical Assist : Mobility (physical);ADLs (physical) Mobility (physical): Bed mobility;Gait;Stairs ADLs (physical): Dressing;IADLs Mobility Comments: W/C at baseline. IND  transfers to [facility] ADLs Comments: Brother who is home 24/7 who assists with IADLs. Pt states they bath IND with sponge baths as well as grooms IND.     Hand Dominance        Extremity/Trunk Assessment   Upper Extremity Assessment Upper Extremity Assessment: Generalized weakness;RUE deficits/detail RUE Deficits / Details: prior L CVA    Lower Extremity Assessment Lower Extremity Assessment: Overall WFL for tasks assessed;RLE deficits/detail RLE Deficits / Details: R AKA        Communication   Communication: Expressive difficulties  Cognition Arousal/Alertness: Awake/alert Behavior During Therapy: Flat affect, WFL for tasks assessed/performed Overall Cognitive Status: No family/caregiver present to determine baseline cognitive functioning                                          General Comments      Exercises Other Exercises Other Exercises: rolling b/l for hygeine after small BM and purewick overload.   Assessment/Plan    PT Assessment Patient needs continued PT services  PT Problem List Decreased strength;Decreased range of motion;Decreased activity tolerance;Decreased balance;Decreased mobility;Decreased cognition;Decreased knowledge of use of DME;Decreased safety awareness;Pain       PT Treatment Interventions DME instruction;Functional mobility training;Therapeutic activities;Therapeutic exercise;Balance training;Patient/family education    PT Goals (Current goals can be found in the Care Plan section)  Acute Rehab PT Goals Patient Stated Goal: unable to state PT Goal Formulation: With patient Time For Goal Achievement: 08/18/22 Potential to Achieve Goals: Poor    Frequency Min 1X/week     Co-evaluation               AM-PAC PT "6 Clicks" Mobility  Outcome Measure Help needed turning from your back to your side while in a flat bed without using bedrails?: A Lot Help needed moving from lying on your back to sitting on the side of a flat bed without using bedrails?: A Lot Help needed moving to and from a bed to a chair (including a wheelchair)?: Total Help needed standing up from a chair using your arms (e.g., wheelchair or bedside chair)?: Total Help needed to walk in hospital room?: Total Help needed climbing 3-5 steps with a railing? : Total 6 Click Score: 8    End of Session   Activity Tolerance: Patient limited by fatigue Patient left: in bed;with call bell/phone within reach;with bed alarm set;with  nursing/sitter in room Nurse Communication: Mobility status PT Visit Diagnosis: Muscle weakness (generalized) (M62.81);Difficulty in walking, not elsewhere classified (R26.2);Pain Pain - Right/Left:  (midline) Pain - part of body:  (sacral)    Time: 1059 (1119)-1141 (start 1123) PT Time Calculation (min) (ACUTE ONLY): 42 min   Charges:   PT Evaluation $PT Eval Moderate Complexity: 1 Mod PT Treatments $Therapeutic Activity: 8-22 mins PT General Charges $$ ACUTE PT VISIT: 1 Visit         Malachi Carl, SPT   Malachi Carl 08/04/2022, 1:13 PM

## 2022-08-04 NOTE — TOC Progression Note (Signed)
Transition of Care [facility]) - Progression Note    Patient Details  Name: [name] MRN: [id number] Date of Birth: 1960-03-20  Transition of Care [facility]) CM/SW Contact  Chapman Fitch, RN Phone Number: 08/04/2022, 3:39 PM  Clinical Narrative:        Therapy recommending SNF Patient and brother agreeable to bed search The defer the decision on bed offers to sister [name]  Per MD infectious diseases consulted. Patient needs 2D echo and possible TEE.    Fl2 sent for signature Bed search initiated  Expected Discharge Plan and Services                                               Social Determinants of Health (SDOH) Interventions SDOH Screenings   Food Insecurity: No Food Insecurity (08/03/2022)  Housing: Low Risk  (08/03/2022)  Transportation Needs: No Transportation Needs (08/03/2022)  Utilities: Not At Risk (08/03/2022)  Tobacco Use: High Risk (08/03/2022)    Readmission Risk Interventions     No data to display

## 2022-08-04 NOTE — Progress Notes (Addendum)
PROGRESS NOTE    [name]  ZOX:[id number] DOB: 1960/09/29 DOA: 08/02/2022 PCP: Marya Fossa, PA-C   Brief Narrative:  This 62 yrs old male with medical history significant of prior/remote stroke, right hemiparesis.  Patient also has a peripheral arterial disease, remote catheterization of the right lower extremity, patient has also had an amputation of the right lower extremity.  Patient was seen by physical therapy at home and was noted to be markedly lethargic/somnolent.  EMS was called who subsequently found the patient to be febrile.  Patient is admitted for severe sepsis found to have MRSA bacteremia,  ID is consulted.  Patient requires TEE.  Assessment & Plan:   Principal Problem:   Pneumonia Active Problems:   Uncontrolled type 2 diabetes mellitus with hyperglycemia, without long-term current use of insulin (HCC)   Stroke (HCC)   CVA (cerebral vascular accident) (HCC)   Acute pulmonary embolism (HCC)   Severe sepsis: MRSA bacteremia: Patient presented with fever, leukocytosis on presentation, elevated lactic acid, altered mentation.   Source, MRSA bacteremia. Possibly from pressure ulcer. Started on empiric IV vancomycin. Infectious diseases consulted.  Patient needs 2D echo and possible TEE.   Possible Pneumonia: Patient is not hypoxic.  CXR showed "Patchy left lung airspace disease concerning for pneumonia." Started on azithro and Unasyn on admission Continue azithro and Unasyn   Hx of PE: This is a remote diagnosis, patient has also had CVA, and also has had critical limb ischemia, patient has therefore been maintained on apixaban chronically.   Continue Eliquis   Hx of CVA (cerebral vascular accident) [facility]): This is a remote diagnosis, patient seems to be on Keppra chronically.  There is documentation of seizure disorder in the chart.   Continue Keppra as IV for now until mental status improves and better oral intake   Type 2 diabetes mellitus: Hold  metformin --reduce glargine to 5u nightly --ACHS and SSI   Hypokalemia: Replaced, Continue to monitor.   DVT prophylaxis: Eliquis Code Status: Full code Family Communication: No family at bed side Disposition Plan:    Status is: Inpatient Remains inpatient appropriate because: Admitted for severe sepsis found to have MRSA bacteremia,  source of infection from the heel.  Infectious disease consulted,  recommended long-term antibiotics.  TEE is advised.    Consultants:  Infectious Diseases.  Cardiology  Procedures: None  Antimicrobials:  Anti-infectives (From admission, onward)    Start     Dose/Rate Route Frequency Ordered Stop   08/03/22 2200  vancomycin (VANCOREADY) IVPB 750 mg/150 mL        750 mg 150 mL/hr over 60 Minutes Intravenous Every 12 hours 08/03/22 1109     08/03/22 1030  vancomycin (VANCOCIN) IVPB 1000 mg/200 mL premix        1,000 mg 200 mL/hr over 60 Minutes Intravenous NOW 08/03/22 0936 08/03/22 1656   08/02/22 2300  Ampicillin-Sulbactam (UNASYN) 3 g in sodium chloride 0.9 % 100 mL IVPB        3 g 200 mL/hr over 30 Minutes Intravenous Every 6 hours 08/02/22 2204 08/07/22 2259   08/02/22 2200  azithromycin (ZITHROMAX) 500 mg in sodium chloride 0.9 % 250 mL IVPB        500 mg 250 mL/hr over 60 Minutes Intravenous Every 24 hours 08/02/22 2153 08/05/22 2159   08/02/22 1845  vancomycin (VANCOREADY) IVPB 1750 mg/350 mL        1,750 mg 175 mL/hr over 120 Minutes Intravenous  Once 08/02/22 1831 08/02/22 2217  08/02/22 1830  ceFEPIme (MAXIPIME) 2 g in sodium chloride 0.9 % 100 mL IVPB        2 g 200 mL/hr over 30 Minutes Intravenous  Once 08/02/22 1823 08/02/22 2217   08/02/22 1830  vancomycin (VANCOCIN) IVPB 1000 mg/200 mL premix  Status:  Discontinued        1,000 mg 200 mL/hr over 60 Minutes Intravenous  Once 08/02/22 1823 08/02/22 1831       Subjective: Patient was seen and examined at bedside.  Overnight events noted. Patient reports doing better,   still reports having pain, patient appears very deconditioned and weak, frail. Patient seems more alert and awake today following commands.  Objective: Vitals:   08/03/22 1654 08/03/22 2042 08/04/22 0409 08/04/22 0752  BP: 126/70 134/71 138/72 138/77  Pulse: 64 71 70 73  Resp:  18 18   Temp: 98.3 F (36.8 C) 98.8 F (37.1 C) 98.3 F (36.8 C) 98.6 F (37 C)  TempSrc: Axillary  Oral Oral  SpO2: 100% 99% 100% 100%  Weight:      Height:        Intake/Output Summary (Last 24 hours) at 08/04/2022 1139 Last data filed at 08/04/2022 0900 Gross per 24 hour  Intake 2971.79 ml  Output 750 ml  Net 2221.79 ml   Filed Weights   08/02/22 1653 08/03/22 0003 08/03/22 0944  Weight: 79 kg 60 kg 58.5 kg    Examination:  General exam: Appears calm and comfortable, very deconditioned, frail, old looking male. Respiratory system: Clear to auscultation. Respiratory effort normal. RR 15 Cardiovascular system: S1 & S2 heard, RRR. No JVD, murmurs, rubs, gallops or clicks. . Gastrointestinal system: Abdomen is nondistended, soft and nontender.  Normal bowel sounds heard. Central nervous system: Alert and oriented x 3. No focal neurological deficits. Extremities: Right AKA , left heel wound with dressing noted. Skin: No rashes, lesions or ulcers Psychiatry: Judgement and insight appear normal. Mood & affect appropriate.     Data Reviewed: I have personally reviewed following labs and imaging studies  CBC: Recent Labs  Lab 08/02/22 1720 08/03/22 0458 08/04/22 0519  WBC 13.0* 9.7 6.2  NEUTROABS 11.7*  --   --   HGB 12.6* 10.3* 9.8*  HCT 37.9* 31.2* 28.8*  MCV 88.1 88.6 86.5  PLT 278 215 203   Basic Metabolic Panel: Recent Labs  Lab 08/02/22 1720 08/03/22 0458 08/04/22 0519  NA 136 140 138  K 2.9* 3.0* 2.6*  CL 100 106 103  CO2 26 28 28   GLUCOSE 236* 101* 76  BUN 15 11 8   CREATININE 0.53* 0.38* 0.37*  CALCIUM 8.4* 8.3* 8.2*  MG  --   --  1.8   GFR: Estimated Creatinine  Clearance: 81.3 mL/min (A) (by C-G formula based on SCr of 0.37 mg/dL (L)). Liver Function Tests: Recent Labs  Lab 08/02/22 1720  AST 18  ALT 12  ALKPHOS 72  BILITOT 0.8  PROT 8.1  ALBUMIN 3.0*   No results for input(s): "LIPASE", "AMYLASE" in the last 168 hours. No results for input(s): "AMMONIA" in the last 168 hours. Coagulation Profile: Recent Labs  Lab 08/02/22 1747 08/03/22 0458  INR 1.3* 1.4*   Cardiac Enzymes: No results for input(s): "CKTOTAL", "CKMB", "CKMBINDEX", "TROPONINI" in the last 168 hours. BNP (last 3 results) No results for input(s): "PROBNP" in the last 8760 hours. HbA1C: Recent Labs    08/03/22 0028  HGBA1C 6.4*   CBG: Recent Labs  Lab 08/03/22 1101 08/03/22 1408 08/03/22 1651  08/03/22 2106 08/04/22 0747  GLUCAP 79 96 113* 147* 71   Lipid Profile: No results for input(s): "CHOL", "HDL", "LDLCALC", "TRIG", "CHOLHDL", "LDLDIRECT" in the last 72 hours. Thyroid Function Tests: No results for input(s): "TSH", "T4TOTAL", "FREET4", "T3FREE", "THYROIDAB" in the last 72 hours. Anemia Panel: No results for input(s): "VITAMINB12", "FOLATE", "FERRITIN", "TIBC", "IRON", "RETICCTPCT" in the last 72 hours. Sepsis Labs: Recent Labs  Lab 08/02/22 1720 08/02/22 1946 08/02/22 2339 08/03/22 0028 08/03/22 0456  PROCALCITON  --   --   --   --  <0.10  LATICACIDVEN 2.5* 2.3* 1.1 1.2  --     Recent Results (from the past 240 hour(s))  Culture, blood (Routine x 2)     Status: None (Preliminary result)   Collection Time: 08/02/22  5:20 PM   Specimen: BLOOD  Result Value Ref Range Status   Specimen Description BLOOD RIGHT ANTECUBITAL  Final   Special Requests   Final    BOTTLES DRAWN AEROBIC AND ANAEROBIC Blood Culture adequate volume   Culture  Setup Time   Final    GRAM POSITIVE COCCI ANAEROBIC BOTTLE ONLY CRITICAL VALUE NOTED.  VALUE IS CONSISTENT WITH PREVIOUSLY REPORTED AND CALLED VALUE. ASW Performed at [facility], [address] [place], [place] [address]    Culture [facility] POSITIVE COCCI  Final   Report Status PENDING  Incomplete  Culture, blood (Routine x 2)     Status: Abnormal (Preliminary result)   Collection Time: 08/02/22  5:20 PM   Specimen: BLOOD  Result Value Ref Range Status   Specimen Description   Final    BLOOD LEFT ANTECUBITAL Performed at [facility], [address], [place], [place] [address]    Special Requests   Final    BOTTLES DRAWN AEROBIC AND ANAEROBIC Blood Culture adequate volume Performed at [facility], [address] [place], [place] [address]    Culture  Setup Time   Final    IN BOTH AEROBIC AND ANAEROBIC BOTTLES GRAM POSITIVE COCCI CRITICAL RESULT CALLED TO, READ BACK BY AND VERIFIED WITH: SHEEMA HALLAJI 08/03/22 @ 0843  BY SH    Culture (A)  Final    STAPHYLOCOCCUS AUREUS SUSCEPTIBILITIES TO FOLLOW Performed at [facility], [address] [place], [place] [address]    Report Status PENDING  Incomplete  Blood Culture ID Panel (Reflexed)     Status: Abnormal   Collection Time: 08/02/22  5:20 PM  Result Value Ref Range Status   Enterococcus faecalis NOT DETECTED NOT DETECTED Final   Enterococcus Faecium NOT DETECTED NOT DETECTED Final   Listeria monocytogenes NOT DETECTED NOT DETECTED Final   Staphylococcus species DETECTED (A) NOT DETECTED Final    Comment: CRITICAL RESULT CALLED TO, READ BACK BY AND VERIFIED WITH: SHEEMA HALLAJI 08/03/22 @ 0843 BY SH    Staphylococcus aureus (BCID) DETECTED (A) NOT DETECTED Final    Comment: Methicillin (oxacillin)-resistant Staphylococcus aureus (MRSA). MRSA is predictably resistant to beta-lactam antibiotics (except ceftaroline). Preferred therapy is vancomycin unless clinically contraindicated. Patient requires contact precautions if  hospitalized. CRITICAL RESULT CALLED TO, READ BACK BY AND VERIFIED WITH: SHEEMA HALLAJI 08/03/22 @ 0843 BY SH    Staphylococcus epidermidis NOT DETECTED NOT DETECTED Final    Staphylococcus lugdunensis NOT DETECTED NOT DETECTED Final   Streptococcus species NOT DETECTED NOT DETECTED Final   Streptococcus agalactiae NOT DETECTED NOT DETECTED Final   Streptococcus pneumoniae NOT DETECTED NOT DETECTED Final   Streptococcus pyogenes NOT DETECTED NOT DETECTED Final  A.calcoaceticus-baumannii NOT DETECTED NOT DETECTED Final   Bacteroides fragilis NOT DETECTED NOT DETECTED Final   Enterobacterales NOT DETECTED NOT DETECTED Final   Enterobacter cloacae complex NOT DETECTED NOT DETECTED Final   Escherichia coli NOT DETECTED NOT DETECTED Final   Klebsiella aerogenes NOT DETECTED NOT DETECTED Final   Klebsiella oxytoca NOT DETECTED NOT DETECTED Final   Klebsiella pneumoniae NOT DETECTED NOT DETECTED Final   Proteus species NOT DETECTED NOT DETECTED Final   Salmonella species NOT DETECTED NOT DETECTED Final   Serratia marcescens NOT DETECTED NOT DETECTED Final   Haemophilus influenzae NOT DETECTED NOT DETECTED Final   Neisseria meningitidis NOT DETECTED NOT DETECTED Final   Pseudomonas aeruginosa NOT DETECTED NOT DETECTED Final   Stenotrophomonas maltophilia NOT DETECTED NOT DETECTED Final   Candida albicans NOT DETECTED NOT DETECTED Final   Candida auris NOT DETECTED NOT DETECTED Final   Candida glabrata NOT DETECTED NOT DETECTED Final   Candida krusei NOT DETECTED NOT DETECTED Final   Candida parapsilosis NOT DETECTED NOT DETECTED Final   Candida tropicalis NOT DETECTED NOT DETECTED Final   Cryptococcus neoformans/gattii NOT DETECTED NOT DETECTED Final   Meth resistant mecA/C and MREJ DETECTED (A) NOT DETECTED Final    Comment: CRITICAL RESULT CALLED TO, READ BACK BY AND VERIFIED WITH: SHEEMA HALLAJI 08/03/22 @ 0843 BY SH Performed at [facility], [address] [place], [place] [address]   Respiratory (~20 pathogens) panel by PCR     Status: None   Collection Time: 08/02/22 11:51 PM   Specimen: Nasopharyngeal Swab; Respiratory  Result Value Ref Range  Status   Adenovirus NOT DETECTED NOT DETECTED Final   Coronavirus 229E NOT DETECTED NOT DETECTED Final    Comment: (NOTE) The Coronavirus on the Respiratory Panel, DOES NOT test for the novel  Coronavirus (2019 nCoV)    Coronavirus HKU1 NOT DETECTED NOT DETECTED Final   Coronavirus NL63 NOT DETECTED NOT DETECTED Final   Coronavirus OC43 NOT DETECTED NOT DETECTED Final   Metapneumovirus NOT DETECTED NOT DETECTED Final   Rhinovirus / Enterovirus NOT DETECTED NOT DETECTED Final   Influenza A NOT DETECTED NOT DETECTED Final   Influenza B NOT DETECTED NOT DETECTED Final   Parainfluenza Virus 1 NOT DETECTED NOT DETECTED Final   Parainfluenza Virus 2 NOT DETECTED NOT DETECTED Final   Parainfluenza Virus 3 NOT DETECTED NOT DETECTED Final   Parainfluenza Virus 4 NOT DETECTED NOT DETECTED Final   Respiratory Syncytial Virus NOT DETECTED NOT DETECTED Final   Bordetella pertussis NOT DETECTED NOT DETECTED Final   Bordetella Parapertussis NOT DETECTED NOT DETECTED Final   Chlamydophila pneumoniae NOT DETECTED NOT DETECTED Final   Mycoplasma pneumoniae NOT DETECTED NOT DETECTED Final    Comment: Performed at [facility], [address] [place], [place] [address]  SARS Coronavirus 2 by RT PCR (hospital order, performed in [facility] hospital lab) *cepheid single result test*     Status: None   Collection Time: 08/02/22 11:51 PM  Result Value Ref Range Status   SARS Coronavirus 2 by RT PCR NEGATIVE NEGATIVE Final    Comment: (NOTE) SARS-CoV-2 target nucleic acids are NOT DETECTED.  The SARS-CoV-2 RNA is generally detectable in upper and lower respiratory specimens during the acute phase of infection. The lowest concentration of SARS-CoV-2 viral copies this assay can detect is 250 copies / mL. A negative result does not preclude SARS-CoV-2 infection and should not be used as the sole basis for treatment or other patient management decisions.  A negative result may occur with improper  specimen collection / handling, submission of specimen other than nasopharyngeal swab, presence of viral mutation(s) within the areas targeted by this assay, and inadequate number of viral copies (<250 copies / mL). A negative result must be combined with clinical observations, patient history, and epidemiological information.  Fact Sheet for Patients:   RoadLapTop.co.za  Fact Sheet for Healthcare Providers: http://kim-miller.com/  This test is not yet approved or  cleared by the [place] FDA and has been authorized for detection and/or diagnosis of SARS-CoV-2 by FDA under an Emergency Use Authorization (EUA).  This EUA will remain in effect (meaning this test can be used) for the duration of the COVID-19 declaration under Section 564(b)(1) of the Act, 21 U.S.C. section 360bbb-3(b)(1), unless the authorization is terminated or revoked sooner.  Performed at [facility], [address] [place], [place] [address]   Aerobic Culture w Gram Stain (superficial specimen)     Status: None (Preliminary result)   Collection Time: 08/03/22  8:28 PM   Specimen: Wound  Result Value Ref Range Status   Specimen Description   Final    WOUND Performed at [facility], [address] [place], [place] [address]    Special Requests   Final    LEFT FOOT Performed at [facility], [address] [place], [place] [address]    Gram Stain   Final    RARE WBC PRESENT, PREDOMINANTLY PMN FEW GRAM POSITIVE COCCI    Culture   Final    TOO YOUNG TO READ Performed at [facility], [address] [place], [place] [address]    Report Status PENDING  Incomplete  MRSA Next Gen by PCR, Nasal     Status: Abnormal   Collection Time: 08/03/22 10:57 PM   Specimen: Nasal Mucosa; Nasal Swab  Result Value Ref Range Status   MRSA by PCR Next Gen DETECTED (A) NOT DETECTED Final    Comment: CRITICAL RESULT CALLED TO, READ BACK  BY AND VERIFIED WITH: BILL WENDT RN @0011  08/04/22 ASW (NOTE) The GeneXpert MRSA Assay (FDA approved for NASAL specimens only), is one component of a comprehensive MRSA colonization surveillance program. It is not intended to diagnose MRSA infection nor to guide or monitor treatment for MRSA infections. Test performance is not FDA approved in patients less than 96 years old. Performed at [facility], [address] [place], [place] [address]          Radiology Studies: DG Foot Complete Left  Result Date: 08/02/2022 CLINICAL DATA:  Skin wound in the heel EXAM: LEFT FOOT - COMPLETE 3+ VIEW COMPARISON:  None Available. FINDINGS: No displaced fracture or dislocation is seen. Osteopenia is seen involving structures. There are no focal lytic lesions. Arterial calcifications are seen in soft tissues. IMPRESSION: No fracture or dislocation is seen. Osteopenia. There are no focal lytic lesions. If there is clinical suspicion for osteomyelitis, follow-up MRI may be considered. 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 Electronically Signed   By: Ernie Avena M.D.   On: 08/02/2022 17:53   DG Chest 2 View  Result Date: 08/02/2022 CLINICAL DATA:  Fever, altered mental status EXAM: CHEST - 2 VIEW COMPARISON:  12/30/2021 FINDINGS: Low lung volumes. Patchy airspace disease in the left mid and lower lung. Right lung clear. No effusions. Heart mediastinal contours within normal limits. No acute bony abnormality. IMPRESSION: Patchy left lung airspace disease concerning for pneumonia. Low lung volumes. Electronically  Signed   By: Charlett Nose M.D.   On: 08/02/2022 17:52    Scheduled Meds:  amLODipine  10 mg Oral Daily   apixaban  5 mg Oral BID   aspirin EC  81 mg Oral Daily   atorvastatin  40 mg Oral q1800   bethanechol  10 mg Oral TID   Chlorhexidine Gluconate Cloth  6 each Topical Q0600   finasteride  5 mg Oral Daily   insulin aspart  0-15 Units  Subcutaneous TID WC   insulin aspart  0-5 Units Subcutaneous QHS   insulin glargine-yfgn  5 Units Subcutaneous QHS   mupirocin ointment  1 Application Nasal BID   potassium chloride  40 mEq Oral Once   sodium chloride flush  3 mL Intravenous Q12H   tamsulosin  0.4 mg Oral QPC supper   Continuous Infusions:  ampicillin-sulbactam (UNASYN) IV 3 g (08/04/22 0411)   azithromycin Stopped (08/03/22 2228)   lactated ringers 50 mL/hr at 08/04/22 0341   levETIRAcetam Stopped (08/04/22 0045)   potassium chloride 10 mEq (08/04/22 0947)   vancomycin Stopped (08/03/22 2253)     LOS: 2 days    Time spent: 50 mins.    Willeen Niece, MD [facility]   If 7PM-7AM, please contact night-coverage

## 2022-08-05 ENCOUNTER — Inpatient Hospital Stay (HOSPITAL_COMMUNITY)
Admit: 2022-08-05 | Discharge: 2022-08-05 | Disposition: A | Discharge status: Home / Self Care | Payer: Medicare Other | Attending: Infectious Diseases | Admitting: Infectious Diseases

## 2022-08-05 DIAGNOSIS — J189 Pneumonia, unspecified organism: Secondary | ICD-10-CM | POA: Diagnosis not present

## 2022-08-05 DIAGNOSIS — R7881 Bacteremia: Secondary | ICD-10-CM

## 2022-08-05 LAB — BASIC METABOLIC PANEL
Anion gap: 7 (ref 5–15)
BUN: 9 mg/dL (ref 6–20)
CO2: 28 mmol/L (ref 22–32)
Calcium: 8.2 mg/dL — ABNORMAL LOW (ref 8.9–10.3)
Chloride: 103 mmol/L (ref 98–111)
Creatinine, Ser: 0.4 mg/dL — ABNORMAL LOW (ref 0.61–1.24)
GFR, Estimated: >60 mL/min (ref >60)
Glucose, Bld: 153 mg/dL — ABNORMAL HIGH (ref 70–99)
Potassium: 3.6 mmol/L (ref 3.5–5.1)
Sodium: 138 mmol/L (ref 135–145)

## 2022-08-05 LAB — GLUCOSE, CAPILLARY
Glucose-Capillary: 100 mg/dL — ABNORMAL HIGH (ref 70–99)
Glucose-Capillary: 125 mg/dL — ABNORMAL HIGH (ref 70–99)
Glucose-Capillary: 116 mg/dL — ABNORMAL HIGH (ref 70–99)
Glucose-Capillary: 146 mg/dL — ABNORMAL HIGH (ref 70–99)

## 2022-08-05 LAB — CULTURE, BLOOD (ROUTINE X 2): Special Requests: ADEQUATE

## 2022-08-05 LAB — CBC
HCT: 30.7 % — ABNORMAL LOW (ref 39.0–52.0)
Hemoglobin: 10.5 g/dL — ABNORMAL LOW (ref 13.0–17.0)
MCH: 29.6 pg (ref 26.0–34.0)
MCHC: 34.2 g/dL (ref 30.0–36.0)
MCV: 86.5 fL (ref 80.0–100.0)
Platelets: 233 10*3/uL (ref 150–400)
RBC: 3.55 MIL/uL — ABNORMAL LOW (ref 4.22–5.81)
RDW: 11.9 % (ref 11.5–15.5)
WBC: 5.1 10*3/uL (ref 4.0–10.5)
nRBC: 0 % (ref 0.0–0.2)

## 2022-08-05 LAB — ECHOCARDIOGRAM COMPLETE
AR max vel: 1.89 cm2
AV Area VTI: 2.11 cm2
AV Area mean vel: 1.76 cm2
AV Mean grad: 11 mmHg
AV Peak grad: 19.7 mmHg
Ao pk vel: 2.22 m/s
Area-P 1/2: 3.16 cm2
Height: 67 in
P 1/2 time: 415 msec
S' Lateral: 3.1 cm
Weight: 2063.51 oz

## 2022-08-05 LAB — MAGNESIUM: Magnesium: 2 mg/dL (ref 1.7–2.4)

## 2022-08-05 NOTE — Progress Notes (Signed)
Speech Language Pathology Treatment: Dysphagia  Patient Details Name: [name] MRN: [id number] DOB: 01-31-1960 Today's Date: 08/05/2022 Time: 1191-4782 SLP Time Calculation (min) (ACUTE ONLY): 15 min  Assessment / Plan / Recommendation Clinical Impression  Pt seen for diet tolerance and trials of upgraded textures. Pt awake, slow to respond at times. Speech is clear, but intermittently hypophonic. Congested, baseline cough noted with repositioning.  Per RN note, 7/12, "Patient more awake today and able to answer questions appropriately. Patient's intake has been poor PTA, however, patient communicated to RN that he is hungry, but does not want not like the pureed diet or thickened liquid. Patient states he eats regular food at home and feeds himself. RN advised patient will need to consult MD for diet change/swallow reevaluation."   Based on today's re-evaluation, pt presents with s/sx moderate oral dysphagia with suspected pharyngeal dysphagia. Oral phase notable for prolonged mastication which is likely secondary due dental status and exacerbated by mental status. Adequate oral clearance achieved. Pharyngeally, pt with x1 delayed, congested cough with thin liquids via straw sip. No other overt s/sx with solid, puree, or liquids via cup sip.   Recommend diet upgrade to Dysphagia 2 with Thin Liquids with safe swallowing strategies as outlined below including avoidance of straws and assistance with feeding.   SLP to f/u per POC for diet tolerance and consideration for MBSS.    HPI HPI: Pt is a 62 y.o. male with medical history significant of prior/remote stroke, right hemiparesis with resultant Cognitive impairment, some degree of aphasia. During admit in 04/2021 per chart notes, Neurology consulted and evaluated by Dr. Amada Jupiter. No focal weakness or deficits, only generalized weakness. He strongly suspected Cognitive dysfunction due to some degree of Dementia. Patient also has a  peripheral arterial disease, remote catheterization of the right lower extremity, patient has also had an amputation of the right lower extremity.  Patient was seen by physical therapy at home today and was noted to be markedly lethargic/somnolent.  EMS was called who subsequently found the patient to be febrile.  There is no report of patient having cough or tachypnea or vomiting or diarrhea or rash on his skin.  Patient is brought to ER for further evaluation.   CXR: Patchy left lung airspace disease concerning for pneumonia.  Low lung volumes.   MRI in 2023: Chronic small vessel ischemic disease with multiple chronic  lacunar infarcts, as detailed.  4. Small chronic infarcts within bilateral cerebellar hemispheres.  5. Age advanced parenchymal atrophy, most notably affecting the  cerebrum and brainstem.      SLP Plan  Continue with current plan of care      Recommendations for follow up therapy are one component of a multi-disciplinary discharge planning process, led by the attending physician.  Recommendations may be updated based on patient status, additional functional criteria and insurance authorization.    Recommendations  Diet recommendations: Dysphagia 2 (fine chop);Thin liquid Liquids provided via: Cup;No straw Medication Administration: Crushed with puree Supervision: Staff to assist with self feeding;Full supervision/cueing for compensatory strategies Compensations: Minimize environmental distractions;Slow rate;Small sips/bites Postural Changes and/or Swallow Maneuvers: Out of bed for meals;Seated upright 90 degrees;Upright 30-60 min after meal                  Oral care BID;Oral care before and after PO;Staff/trained caregiver to provide oral care   Frequent or constant Supervision/Assistance Dysphagia, oropharyngeal phase (R13.12)     Continue with current plan of care    [facility]  [name] [facility] [phone] Arnette Felts)  Woodroe Chen  08/05/2022, 9:58 AM

## 2022-08-05 NOTE — Progress Notes (Signed)
PROGRESS NOTE    [name]  ZOX:[id number] DOB: May 07, 1960 DOA: 08/02/2022 PCP: Marya Fossa, PA-C   Brief Narrative:  This 62 yrs old male with medical history significant of prior/remote stroke, right hemiparesis.  Patient also has a peripheral arterial disease, remote catheterization of the right lower extremity, patient has also had an amputation of the right lower extremity.  Patient was seen by physical therapy at home and was noted to be markedly lethargic/somnolent.  EMS was called who subsequently found the patient to be febrile.  Patient is admitted for severe sepsis found to have MRSA bacteremia,  ID is consulted.  Patient requires TEE.  Assessment & Plan:   Principal Problem:   Pneumonia Active Problems:   Uncontrolled type 2 diabetes mellitus with hyperglycemia, without long-term current use of insulin (HCC)   Stroke (HCC)   CVA (cerebral vascular accident) (HCC)   Acute pulmonary embolism (HCC)   MRSA bacteremia   Severe sepsis: MRSA bacteremia: Patient presented with fever, leukocytosis on presentation, elevated lactic acid, altered mentation.   Source, MRSA bacteremia. Possibly from pressure ulcer. Started on empiric IV vancomycin. Infectious diseases consulted.  Patient needs 2D echo and possible TEE. Encephalopathy has improved.  MRSA nares +.   Possible Pneumonia: Patient is not hypoxic.  CXR showed "Patchy left lung airspace disease concerning for pneumonia." Started on azithro and Unasyn on admission Continue Unasyn and vancomycin.  Zithromax discontinued.   Hx of PE: This is a remote diagnosis, patient has also had CVA, and also has had critical limb ischemia, Patient has therefore been maintained on apixaban chronically.   Continue Eliquis   Hx of CVA (cerebral vascular accident) [facility]): This is a remote diagnosis, patient seems to be on Keppra chronically.  There is documentation of seizure disorder in the chart.   Continue Keppra as IV for now  until mental status improves and better oral intake   Type 2 diabetes mellitus: Hold metformin --reduce glargine to 5u nightly --ACHS and SSI   Hypokalemia: Replaced, Continue to monitor.   DVT prophylaxis: Eliquis Code Status: Full code Family Communication: No family at bed side Disposition Plan:    Status is: Inpatient Remains inpatient appropriate because: Admitted for severe sepsis found to have MRSA bacteremia,  source of infection from the heel.  Infectious disease consulted,  recommended long-term antibiotics.  TEE is advised.    Consultants:  Infectious Diseases.  Cardiology  Procedures: None  Antimicrobials:  Anti-infectives (From admission, onward)    Start     Dose/Rate Route Frequency Ordered Stop   08/03/22 2200  vancomycin (VANCOREADY) IVPB 750 mg/150 mL        750 mg 150 mL/hr over 60 Minutes Intravenous Every 12 hours 08/03/22 1109     08/03/22 1030  vancomycin (VANCOCIN) IVPB 1000 mg/200 mL premix        1,000 mg 200 mL/hr over 60 Minutes Intravenous NOW 08/03/22 0936 08/03/22 1656   08/02/22 2300  Ampicillin-Sulbactam (UNASYN) 3 g in sodium chloride 0.9 % 100 mL IVPB        3 g 200 mL/hr over 30 Minutes Intravenous Every 6 hours 08/02/22 2204 08/07/22 2159   08/02/22 2200  azithromycin (ZITHROMAX) 500 mg in sodium chloride 0.9 % 250 mL IVPB  Status:  Discontinued        500 mg 250 mL/hr over 60 Minutes Intravenous Every 24 hours 08/02/22 2153 08/04/22 1531   08/02/22 1845  vancomycin (VANCOREADY) IVPB 1750 mg/350 mL  1,750 mg 175 mL/hr over 120 Minutes Intravenous  Once 08/02/22 1831 08/02/22 2217   08/02/22 1830  ceFEPIme (MAXIPIME) 2 g in sodium chloride 0.9 % 100 mL IVPB        2 g 200 mL/hr over 30 Minutes Intravenous  Once 08/02/22 1823 08/02/22 2217   08/02/22 1830  vancomycin (VANCOCIN) IVPB 1000 mg/200 mL premix  Status:  Discontinued        1,000 mg 200 mL/hr over 60 Minutes Intravenous  Once 08/02/22 1823 08/02/22 1831        Subjective: Patient was seen and examined at bedside.  Overnight events noted. Patient reports doing much better.  He still reports having pain.  Patient appears very deconditioned, weak, frail. Patient seems more alert and awake today following commands.  Objective: Vitals:   08/04/22 1619 08/04/22 2044 08/05/22 0417 08/05/22 0840  BP: 132/75 126/73 135/72 (!) 141/81  Pulse: 75 72 68 70  Resp: 18 18 18 18   Temp: 98.2 F (36.8 C) 98 F (36.7 C)  (!) 97.4 F (36.3 C)  TempSrc:      SpO2: 100% 100% 98% 100%  Weight:      Height:        Intake/Output Summary (Last 24 hours) at 08/05/2022 1245 Last data filed at 08/05/2022 1100 Gross per 24 hour  Intake 2293.9 ml  Output 600 ml  Net 1693.9 ml   Filed Weights   08/02/22 1653 08/03/22 0003 08/03/22 0944  Weight: 79 kg 60 kg 58.5 kg    Examination:  General exam: Appears deconditioned, frail, old looking male but calm and comfortable Respiratory system: CTA bilaterally. Respiratory effort normal. RR 14 Cardiovascular system: S1 & S2 heard, RRR. No JVD, murmurs, rubs, gallops or clicks. . Gastrointestinal system: Abdomen is nondistended, soft and nontender.  Normal bowel sounds heard. Central nervous system: Alert and oriented x 2. No focal neurological deficits. Extremities: Right AKA , left heel wound with dressing noted. Skin: No rashes, lesions or ulcers Psychiatry: Judgement and insight appear normal. Mood & affect appropriate.     Data Reviewed: I have personally reviewed following labs and imaging studies  CBC: Recent Labs  Lab 08/02/22 1720 08/03/22 0458 08/04/22 0519 08/05/22 0516  WBC 13.0* 9.7 6.2 5.1  NEUTROABS 11.7*  --   --   --   HGB 12.6* 10.3* 9.8* 10.5*  HCT 37.9* 31.2* 28.8* 30.7*  MCV 88.1 88.6 86.5 86.5  PLT 278 215 203 233   Basic Metabolic Panel: Recent Labs  Lab 08/02/22 1720 08/03/22 0458 08/04/22 0519 08/05/22 0516  NA 136 140 138 138  K 2.9* 3.0* 2.6* 3.6  CL 100 106 103 103   CO2 26 28 28 28   GLUCOSE 236* 101* 76 153*  BUN 15 11 8 9   CREATININE 0.53* 0.38* 0.37* 0.40*  CALCIUM 8.4* 8.3* 8.2* 8.2*  MG  --   --  1.8 2.0   GFR: Estimated Creatinine Clearance: 81.3 mL/min (A) (by C-G formula based on SCr of 0.4 mg/dL (L)). Liver Function Tests: Recent Labs  Lab 08/02/22 1720  AST 18  ALT 12  ALKPHOS 72  BILITOT 0.8  PROT 8.1  ALBUMIN 3.0*   No results for input(s): "LIPASE", "AMYLASE" in the last 168 hours. No results for input(s): "AMMONIA" in the last 168 hours. Coagulation Profile: Recent Labs  Lab 08/02/22 1747 08/03/22 0458  INR 1.3* 1.4*   Cardiac Enzymes: No results for input(s): "CKTOTAL", "CKMB", "CKMBINDEX", "TROPONINI" in the last 168 hours. BNP (  last 3 results) No results for input(s): "PROBNP" in the last 8760 hours. HbA1C: Recent Labs    08/03/22 0028  HGBA1C 6.4*   CBG: Recent Labs  Lab 08/04/22 0747 08/04/22 1153 08/04/22 1620 08/04/22 2122 08/05/22 1133  GLUCAP 71 89 130* 194* 125*   Lipid Profile: No results for input(s): "CHOL", "HDL", "LDLCALC", "TRIG", "CHOLHDL", "LDLDIRECT" in the last 72 hours. Thyroid Function Tests: No results for input(s): "TSH", "T4TOTAL", "FREET4", "T3FREE", "THYROIDAB" in the last 72 hours. Anemia Panel: No results for input(s): "VITAMINB12", "FOLATE", "FERRITIN", "TIBC", "IRON", "RETICCTPCT" in the last 72 hours. Sepsis Labs: Recent Labs  Lab 08/02/22 1720 08/02/22 1946 08/02/22 2339 08/03/22 0028 08/03/22 0456  PROCALCITON  --   --   --   --  <0.10  LATICACIDVEN 2.5* 2.3* 1.1 1.2  --     Recent Results (from the past 240 hour(s))  Culture, blood (Routine x 2)     Status: None (Preliminary result)   Collection Time: 08/02/22  5:20 PM   Specimen: BLOOD  Result Value Ref Range Status   Specimen Description   Final    BLOOD RIGHT ANTECUBITAL Performed at [facility], [address] [place], [place] [address]    Special Requests   Final    BOTTLES DRAWN  AEROBIC AND ANAEROBIC Blood Culture adequate volume Performed at [facility], [address] [place], [place] [address]    Culture  Setup Time   Final    GRAM POSITIVE COCCI ANAEROBIC BOTTLE ONLY CRITICAL VALUE NOTED.  VALUE IS CONSISTENT WITH PREVIOUSLY REPORTED AND CALLED VALUE. ASW Performed at [facility], [address] [place], [place] [address]    Culture   Final    Romie Minus POSITIVE COCCI IDENTIFICATION TO FOLLOW Performed at [facility], [address], [place], [place] [address]    Report Status PENDING  Incomplete  Culture, blood (Routine x 2)     Status: Abnormal (Preliminary result)   Collection Time: 08/02/22  5:20 PM   Specimen: BLOOD  Result Value Ref Range Status   Specimen Description   Final    BLOOD LEFT ANTECUBITAL Performed at [facility], [address] [place], [place] [address]    Special Requests   Final    BOTTLES DRAWN AEROBIC AND ANAEROBIC Blood Culture adequate volume Performed at [facility], [address] [place], [place] [address]    Culture  Setup Time   Final    IN BOTH AEROBIC AND ANAEROBIC BOTTLES GRAM POSITIVE COCCI CRITICAL RESULT CALLED TO, READ BACK BY AND VERIFIED WITH: SHEEMA HALLAJI 08/03/22 @ 0843  BY SH    Culture (A)  Final    METHICILLIN RESISTANT STAPHYLOCOCCUS AUREUS Sent to [facility] for further susceptibility testing. Performed at [facility], [address], [place], [place] [address]    Report Status PENDING  Incomplete   Organism ID, Bacteria METHICILLIN RESISTANT STAPHYLOCOCCUS AUREUS  Final      Susceptibility   Methicillin resistant staphylococcus aureus - MIC*    CIPROFLOXACIN <=0.5 SENSITIVE Sensitive     ERYTHROMYCIN >=8 RESISTANT Resistant     GENTAMICIN <=0.5 SENSITIVE Sensitive     OXACILLIN >=4 RESISTANT Resistant     TETRACYCLINE <=1 SENSITIVE Sensitive     VANCOMYCIN 1 SENSITIVE Sensitive     TRIMETH/SULFA <=10 SENSITIVE Sensitive     CLINDAMYCIN  <=0.25 SENSITIVE Sensitive     RIFAMPIN <=0.5 SENSITIVE Sensitive     Inducible Clindamycin NEGATIVE Sensitive  LINEZOLID 2 SENSITIVE Sensitive     * METHICILLIN RESISTANT STAPHYLOCOCCUS AUREUS  Blood Culture ID Panel (Reflexed)     Status: Abnormal   Collection Time: 08/02/22  5:20 PM  Result Value Ref Range Status   Enterococcus faecalis NOT DETECTED NOT DETECTED Final   Enterococcus Faecium NOT DETECTED NOT DETECTED Final   Listeria monocytogenes NOT DETECTED NOT DETECTED Final   Staphylococcus species DETECTED (A) NOT DETECTED Final    Comment: CRITICAL RESULT CALLED TO, READ BACK BY AND VERIFIED WITH: SHEEMA HALLAJI 08/03/22 @ 0843 BY SH    Staphylococcus aureus (BCID) DETECTED (A) NOT DETECTED Final    Comment: Methicillin (oxacillin)-resistant Staphylococcus aureus (MRSA). MRSA is predictably resistant to beta-lactam antibiotics (except ceftaroline). Preferred therapy is vancomycin unless clinically contraindicated. Patient requires contact precautions if  hospitalized. CRITICAL RESULT CALLED TO, READ BACK BY AND VERIFIED WITH: SHEEMA HALLAJI 08/03/22 @ 0843 BY SH    Staphylococcus epidermidis NOT DETECTED NOT DETECTED Final   Staphylococcus lugdunensis NOT DETECTED NOT DETECTED Final   Streptococcus species NOT DETECTED NOT DETECTED Final   Streptococcus agalactiae NOT DETECTED NOT DETECTED Final   Streptococcus pneumoniae NOT DETECTED NOT DETECTED Final   Streptococcus pyogenes NOT DETECTED NOT DETECTED Final   A.calcoaceticus-baumannii NOT DETECTED NOT DETECTED Final   Bacteroides fragilis NOT DETECTED NOT DETECTED Final   Enterobacterales NOT DETECTED NOT DETECTED Final   Enterobacter cloacae complex NOT DETECTED NOT DETECTED Final   Escherichia coli NOT DETECTED NOT DETECTED Final   Klebsiella aerogenes NOT DETECTED NOT DETECTED Final   Klebsiella oxytoca NOT DETECTED NOT DETECTED Final   Klebsiella pneumoniae NOT DETECTED NOT DETECTED Final   Proteus species NOT  DETECTED NOT DETECTED Final   Salmonella species NOT DETECTED NOT DETECTED Final   Serratia marcescens NOT DETECTED NOT DETECTED Final   Haemophilus influenzae NOT DETECTED NOT DETECTED Final   Neisseria meningitidis NOT DETECTED NOT DETECTED Final   Pseudomonas aeruginosa NOT DETECTED NOT DETECTED Final   Stenotrophomonas maltophilia NOT DETECTED NOT DETECTED Final   Candida albicans NOT DETECTED NOT DETECTED Final   Candida auris NOT DETECTED NOT DETECTED Final   Candida glabrata NOT DETECTED NOT DETECTED Final   Candida krusei NOT DETECTED NOT DETECTED Final   Candida parapsilosis NOT DETECTED NOT DETECTED Final   Candida tropicalis NOT DETECTED NOT DETECTED Final   Cryptococcus neoformans/gattii NOT DETECTED NOT DETECTED Final   Meth resistant mecA/C and MREJ DETECTED (A) NOT DETECTED Final    Comment: CRITICAL RESULT CALLED TO, READ BACK BY AND VERIFIED WITH: SHEEMA HALLAJI 08/03/22 @ 0843 BY SH Performed at [facility], [address] [place], [place] [address]   Respiratory (~20 pathogens) panel by PCR     Status: None   Collection Time: 08/02/22 11:51 PM   Specimen: Nasopharyngeal Swab; Respiratory  Result Value Ref Range Status   Adenovirus NOT DETECTED NOT DETECTED Final   Coronavirus 229E NOT DETECTED NOT DETECTED Final    Comment: (NOTE) The Coronavirus on the Respiratory Panel, DOES NOT test for the novel  Coronavirus (2019 nCoV)    Coronavirus HKU1 NOT DETECTED NOT DETECTED Final   Coronavirus NL63 NOT DETECTED NOT DETECTED Final   Coronavirus OC43 NOT DETECTED NOT DETECTED Final   Metapneumovirus NOT DETECTED NOT DETECTED Final   Rhinovirus / Enterovirus NOT DETECTED NOT DETECTED Final   Influenza A NOT DETECTED NOT DETECTED Final   Influenza B NOT DETECTED NOT DETECTED Final   Parainfluenza Virus 1 NOT DETECTED NOT DETECTED Final  Parainfluenza Virus 2 NOT DETECTED NOT DETECTED Final   Parainfluenza Virus 3 NOT DETECTED NOT DETECTED Final    Parainfluenza Virus 4 NOT DETECTED NOT DETECTED Final   Respiratory Syncytial Virus NOT DETECTED NOT DETECTED Final   Bordetella pertussis NOT DETECTED NOT DETECTED Final   Bordetella Parapertussis NOT DETECTED NOT DETECTED Final   Chlamydophila pneumoniae NOT DETECTED NOT DETECTED Final   Mycoplasma pneumoniae NOT DETECTED NOT DETECTED Final    Comment: Performed at [facility], [address], [place], [place] [address]  SARS Coronavirus 2 by RT PCR (hospital order, performed in [facility] hospital lab) *cepheid single result test*     Status: None   Collection Time: 08/02/22 11:51 PM  Result Value Ref Range Status   SARS Coronavirus 2 by RT PCR NEGATIVE NEGATIVE Final    Comment: (NOTE) SARS-CoV-2 target nucleic acids are NOT DETECTED.  The SARS-CoV-2 RNA is generally detectable in upper and lower respiratory specimens during the acute phase of infection. The lowest concentration of SARS-CoV-2 viral copies this assay can detect is 250 copies / mL. A negative result does not preclude SARS-CoV-2 infection and should not be used as the sole basis for treatment or other patient management decisions.  A negative result may occur with improper specimen collection / handling, submission of specimen other than nasopharyngeal swab, presence of viral mutation(s) within the areas targeted by this assay, and inadequate number of viral copies (<250 copies / mL). A negative result must be combined with clinical observations, patient history, and epidemiological information.  Fact Sheet for Patients:   RoadLapTop.co.za  Fact Sheet for Healthcare Providers: http://kim-miller.com/  This test is not yet approved or  cleared by the [place] FDA and has been authorized for detection and/or diagnosis of SARS-CoV-2 by FDA under an Emergency Use Authorization (EUA).  This EUA will remain in effect (meaning this test can be used) for the duration  of the COVID-19 declaration under Section 564(b)(1) of the Act, 21 U.S.C. section 360bbb-3(b)(1), unless the authorization is terminated or revoked sooner.  Performed at [facility], [address] [place], [place] [address]   Aerobic Culture w Gram Stain (superficial specimen)     Status: None (Preliminary result)   Collection Time: 08/03/22  8:28 PM   Specimen: Wound  Result Value Ref Range Status   Specimen Description   Final    WOUND Performed at [facility], [address], [place], [place] [address]    Special Requests   Final    LEFT FOOT Performed at [facility], [address] [place], [place] [address]    Gram Stain   Final    RARE WBC PRESENT, PREDOMINANTLY PMN FEW GRAM POSITIVE COCCI    Culture   Final    TOO YOUNG TO READ Performed at [facility], [address] [place], [place] [address]    Report Status PENDING  Incomplete  MRSA Next Gen by PCR, Nasal     Status: Abnormal   Collection Time: 08/03/22 10:57 PM   Specimen: Nasal Mucosa; Nasal Swab  Result Value Ref Range Status   MRSA by PCR Next Gen DETECTED (A) NOT DETECTED Final    Comment: CRITICAL RESULT CALLED TO, READ BACK BY AND VERIFIED WITH: BILL WENDT RN @0011  08/04/22 ASW (NOTE) The GeneXpert MRSA Assay (FDA approved for NASAL specimens only), is one component of a comprehensive MRSA colonization surveillance program. It is not intended to diagnose MRSA infection nor to  guide or monitor treatment for MRSA infections. Test performance is not FDA approved in patients less than 48 years old. Performed at [facility], [address] [place], [place] [address]   Culture, blood (Routine X 2) w Reflex to ID Panel     Status: None (Preliminary result)   Collection Time: 08/04/22  5:19 AM   Specimen: BLOOD  Result Value Ref Range Status   Specimen Description BLOOD BLOOD RIGHT ARM  Final   Special Requests   Final    BOTTLES DRAWN AEROBIC AND  ANAEROBIC Blood Culture adequate volume   Culture   Final    NO GROWTH < 24 HOURS Performed at [facility], [address], [place], [place] [address]    Report Status PENDING  Incomplete  Culture, blood (Routine X 2) w Reflex to ID Panel     Status: None (Preliminary result)   Collection Time: 08/04/22  5:19 AM   Specimen: BLOOD  Result Value Ref Range Status   Specimen Description BLOOD BLOOD RIGHT HAND  Final   Special Requests   Final    BOTTLES DRAWN AEROBIC AND ANAEROBIC Blood Culture adequate volume   Culture   Final    NO GROWTH < 24 HOURS Performed at [facility], [address] [place], [place] [address]    Report Status PENDING  Incomplete    Radiology Studies: No results found.  Scheduled Meds:  amLODipine  10 mg Oral Daily   apixaban  5 mg Oral BID   aspirin EC  81 mg Oral Daily   atorvastatin  40 mg Oral q1800   bethanechol  10 mg Oral TID   Chlorhexidine Gluconate Cloth  6 each Topical Q0600   finasteride  5 mg Oral Daily   insulin aspart  0-15 Units Subcutaneous TID WC   insulin aspart  0-5 Units Subcutaneous QHS   insulin glargine-yfgn  5 Units Subcutaneous QHS   mupirocin ointment  1 Application Nasal BID   sodium chloride flush  3 mL Intravenous Q12H   tamsulosin  0.4 mg Oral QPC supper   Continuous Infusions:  ampicillin-sulbactam (UNASYN) IV 3 g (08/05/22 4403)   levETIRAcetam 500 mg (08/05/22 1203)   vancomycin 750 mg (08/05/22 0936)     LOS: 3 days    Time spent: 35 mins.    Willeen Niece, MD [facility]   If 7PM-7AM, please contact night-coverage

## 2022-08-05 NOTE — Plan of Care (Signed)
  Problem: Coping: Goal: Ability to adjust to condition or change in health will improve Outcome: Progressing   Problem: Skin Integrity: Goal: Risk for impaired skin integrity will decrease Outcome: Progressing   Problem: Tissue Perfusion: Goal: Adequacy of tissue perfusion will improve Outcome: Progressing   Problem: Activity: Goal: Risk for activity intolerance will decrease Outcome: Progressing   Problem: Nutrition: Goal: Adequate nutrition will be maintained Outcome: Progressing   Problem: Elimination: Goal: Will not experience complications related to bowel motility Outcome: Progressing   Problem: Pain Managment: Goal: General experience of comfort will improve Outcome: Progressing   Problem: Safety: Goal: Ability to remain free from injury will improve Outcome: Progressing   Problem: Skin Integrity: Goal: Risk for impaired skin integrity will decrease Outcome: Progressing

## 2022-08-05 NOTE — TOC Progression Note (Signed)
Transition of Care [facility]) - Progression Note    Patient Details  Name: ABDULAZIZ CHAPLAIN MRN: [id number] Date of Birth: [birth year]  Transition of [facility] CM/SW Contact  Kemper Durie, RN Phone Number: 08/05/2022, 3:40 PM  Clinical Narrative:     Spoke with sister [name], presented current bed offers from [facility] and [facility], she declined both and prefer to wait for other pending offers.  She is also concerned that patient has already used his paid Medicare days and they will not be able to pay out of pocket. Patient was active with [facility] for PT, OT, RN prior to admit, confirmed with Barbara Cower, they are able to restart services if he is discharged back home.  Per sister, patient also has caregivers that come into the home several times a day and helps with bathing and providing food.  TOC will continue to follow.        Expected Discharge Plan and Services                                               Social Determinants of Health (SDOH) Interventions SDOH Screenings   Food Insecurity: No Food Insecurity (08/03/2022)  Housing: Low Risk  (08/03/2022)  Transportation Needs: No Transportation Needs (08/03/2022)  Utilities: Not At Risk (08/03/2022)  Tobacco Use: High Risk (08/03/2022)    Readmission Risk Interventions     No data to display

## 2022-08-05 NOTE — Progress Notes (Signed)
  Echocardiogram 2D Echocardiogram has been performed.  Lenor Coffin 08/05/2022, 11:22 AM

## 2022-08-06 DIAGNOSIS — J189 Pneumonia, unspecified organism: Secondary | ICD-10-CM | POA: Diagnosis not present

## 2022-08-06 DIAGNOSIS — I639 Cerebral infarction, unspecified: Secondary | ICD-10-CM | POA: Diagnosis not present

## 2022-08-06 DIAGNOSIS — R7881 Bacteremia: Secondary | ICD-10-CM | POA: Diagnosis not present

## 2022-08-06 DIAGNOSIS — E1165 Type 2 diabetes mellitus with hyperglycemia: Secondary | ICD-10-CM

## 2022-08-06 LAB — GLUCOSE, CAPILLARY
Glucose-Capillary: 75 mg/dL (ref 70–99)
Glucose-Capillary: 137 mg/dL — ABNORMAL HIGH (ref 70–99)
Glucose-Capillary: 109 mg/dL — ABNORMAL HIGH (ref 70–99)
Glucose-Capillary: 152 mg/dL — ABNORMAL HIGH (ref 70–99)

## 2022-08-06 LAB — BASIC METABOLIC PANEL
Anion gap: 6 (ref 5–15)
BUN: 6 mg/dL (ref 6–20)
CO2: 28 mmol/L (ref 22–32)
Calcium: 8.3 mg/dL — ABNORMAL LOW (ref 8.9–10.3)
Chloride: 102 mmol/L (ref 98–111)
Creatinine, Ser: 0.42 mg/dL — ABNORMAL LOW (ref 0.61–1.24)
GFR, Estimated: >60 mL/min (ref >60)
Glucose, Bld: 93 mg/dL (ref 70–99)
Potassium: 3.2 mmol/L — ABNORMAL LOW (ref 3.5–5.1)
Sodium: 136 mmol/L (ref 135–145)

## 2022-08-06 LAB — CBC
HCT: 29.2 % — ABNORMAL LOW (ref 39.0–52.0)
Hemoglobin: 9.9 g/dL — ABNORMAL LOW (ref 13.0–17.0)
MCH: 29.1 pg (ref 26.0–34.0)
MCHC: 33.9 g/dL (ref 30.0–36.0)
MCV: 85.9 fL (ref 80.0–100.0)
Platelets: 200 10*3/uL (ref 150–400)
RBC: 3.4 MIL/uL — ABNORMAL LOW (ref 4.22–5.81)
RDW: 12 % (ref 11.5–15.5)
WBC: 4.5 10*3/uL (ref 4.0–10.5)
nRBC: 0 % (ref 0.0–0.2)

## 2022-08-06 LAB — VANCOMYCIN, RANDOM: Vancomycin Rm: 8 ug/mL

## 2022-08-06 LAB — MAGNESIUM: Magnesium: 2 mg/dL (ref 1.7–2.4)

## 2022-08-06 MED ORDER — POTASSIUM CHLORIDE CRYS ER 20 MEQ PO TBCR
40.0000 meq | EXTENDED_RELEASE_TABLET | Freq: Once | ORAL | Status: AC
  Administered 2022-08-06: 40 meq via ORAL
  Filled 2022-08-06: qty 2

## 2022-08-06 MED ORDER — VANCOMYCIN HCL IN DEXTROSE 1-5 GM/200ML-% IV SOLN
1000.0000 mg | Freq: Two times a day (BID) | INTRAVENOUS | Status: DC
  Administered 2022-08-06 – 2022-08-08 (×5): 1000 mg via INTRAVENOUS
  Filled 2022-08-06 (×6): qty 200

## 2022-08-06 NOTE — Progress Notes (Signed)
    [facility] has been requested to perform a transesophageal echocardiogram on [name] for MRSA bacteremia.  After careful review of history and examination, the risks and benefits of transesophageal echocardiogram have been explained including risks of esophageal damage, perforation (1:10,000 risk), bleeding, pharyngeal hematoma as well as other potential complications associated with conscious sedation including aspiration, arrhythmia, respiratory failure and death. Alternatives to treatment were discussed, questions were answered. Patient is willing to proceed.   Kennedy Bohanon, NP 08/06/2022 12:20 PM

## 2022-08-06 NOTE — Plan of Care (Signed)
Patient is tolerating his PD this evening. Patient denies complaints and vital signs are WNL.

## 2022-08-06 NOTE — Plan of Care (Signed)

## 2022-08-06 NOTE — Progress Notes (Signed)
Pharmacy Antibiotic Note  [name] is a 62 y.o. male admitted on 08/02/2022 with bacteremia.  Pharmacy has been consulted for Vancomycin dosing. Patient noted to have MRSA bacteremia 7/11.  Source unclear at this time but noted to have sacral and L heel wound.  He has PMH PAD with R AKA.  Currently on antibiotics for possible pneumonia.    08/06/22 7/13 2142 Vancomycin level (trough) = 8 Increased Vancomycin dose to 1000 mg 12 hr Recheck Vancomycin levels as appropriate.  08/04/22 Day #2 antibiotics - vancomycin also on ampicillin/sulbactam (x5 days) and azithromycin (x 3 days) for CAP Renal: SCr 0.37 - stable WBC WNL Afebrile last 24h Vancomycin 1750mg  IV x 1 given 7/10 at 19:35 7/11 due to discrepancy (currently listed at 60kg but previously 79kg in June) in recent weights,  asked to reweigh patient and using mechanical lift scale his weight is 58.5 kg  Plan: Continue Vancomycin 750mg  IV q12h  Estimated AUC = 469 (trough = 14) Goal AUC 400-600 Used  SCr 0.8 mg/dl, TBW (as TBW < IBW) and Vd 0.72 L/kg Monitor renal function Check vancomycin levels as appropriate - likely get trough this weekend Request daptomycin MIC be sent to [facility] ID consulted  Height: 5\' 7"  (170.2 cm) Weight: 58.5 kg (128 lb 15.5 oz) IBW/kg (Calculated) : 66.1  Temp (24hrs), Avg:98.1 F (36.7 C), Min:97.4 F (36.3 C), Max:98.5 F (36.9 C)  Recent Labs  Lab 08/02/22 1720 08/02/22 1946 08/02/22 2339 08/03/22 0028 08/03/22 0458 08/04/22 0519 08/05/22 0516 08/05/22 2142  WBC 13.0*  --   --   --  9.7 6.2 5.1  --   CREATININE 0.53*  --   --   --  0.38* 0.37* 0.40*  --   LATICACIDVEN 2.5* 2.3* 1.1 1.2  --   --   --   --   VANCORANDOM  --   --   --   --   --   --   --  8    Estimated Creatinine Clearance: 81.3 mL/min (A) (by C-G formula based on SCr of 0.4 mg/dL (L)).    No Known Allergies  Antimicrobials this admission: 7/10 vanc >> 7/10 cefepime x 1 7/10 amp/sulb >> (7/15) 7/10 azith  >> (7/12)  Dose adjustments this admission:  Microbiology results: 7/10 BCx: GPC, BCID MRSA   Thank you for allowing pharmacy to be a part of this patient's care.  Otelia Sergeant, PharmD, [facility] 08/06/2022 4:58 AM

## 2022-08-06 NOTE — Progress Notes (Addendum)
PROGRESS NOTE    [name]  ZOX:[id number] DOB: [birth year] DOA: 08/02/2022 PCP: Marya Fossa, PA-C   Brief Narrative:  This 62 yrs old male with medical history significant of prior/remote stroke, right hemiparesis.  Patient also has a peripheral arterial disease, remote catheterization of the right lower extremity, patient has also had an amputation of the right lower extremity.  Patient was seen by physical therapy at home and was noted to be markedly lethargic/somnolent.  EMS was called who subsequently found the patient to be febrile.  Patient is admitted for severe sepsis found to have MRSA bacteremia,  ID is consulted.  Patient requires TEE.  7/14: Remained stable, TTE without any mentioning of valvular vegetations, cardiology was consulted for TEE  Assessment & Plan:   Principal Problem:   Pneumonia Active Problems:   Uncontrolled type 2 diabetes mellitus with hyperglycemia, without long-term current use of insulin (HCC)   Stroke (HCC)   CVA (cerebral vascular accident) (HCC)   Acute pulmonary embolism (HCC)   MRSA bacteremia   Severe sepsis: MRSA bacteremia: Patient presented with fever, leukocytosis on presentation, elevated lactic acid, altered mentation.   Source, MRSA bacteremia. Possibly from pressure ulcer. Started on empiric IV vancomycin. Infectious diseases consulted. Encephalopathy has improved.  MRSA nares +. -TTE was without any mentioning of valvular vegetation -TEE ordered -Continue with vancomycin   Possible Pneumonia: Patient is not hypoxic.  CXR showed "Patchy left lung airspace disease concerning for pneumonia." Started on azithro and Unasyn on admission Continue Unasyn and vancomycin.  Zithromax discontinued.   Hx of PE: This is a remote diagnosis, patient has also had CVA, and also has had critical limb ischemia, Patient has therefore been maintained on apixaban chronically.   Continue Eliquis   Hx of CVA (cerebral vascular accident)  [facility]): This is a remote diagnosis, patient seems to be on Keppra chronically.  There is documentation of seizure disorder in the chart.   Continue Keppra as IV for now until mental status improves and better oral intake   Type 2 diabetes mellitus: Hold metformin --reduce glargine to 5u nightly --ACHS and SSI   Hypokalemia: Potassium at 3.2 which is being repleted.  Magnesium 2  DVT prophylaxis: Eliquis Code Status: Full code Family Communication: No family at bed side Disposition Plan:    Status is: Inpatient Remains inpatient appropriate because: Admitted for severe sepsis found to have MRSA bacteremia,  source of infection from the heel.  Infectious disease consulted,  recommended long-term antibiotics.  TEE is advised.    Consultants:  Infectious Diseases.  Cardiology  Procedures: None  Antimicrobials:  Anti-infectives (From admission, onward)    Start     Dose/Rate Route Frequency Ordered Stop   08/06/22 1000  vancomycin (VANCOCIN) IVPB 1000 mg/200 mL premix        1,000 mg 200 mL/hr over 60 Minutes Intravenous Every 12 hours 08/06/22 0416     08/03/22 2200  vancomycin (VANCOREADY) IVPB 750 mg/150 mL  Status:  Discontinued        750 mg 150 mL/hr over 60 Minutes Intravenous Every 12 hours 08/03/22 1109 08/06/22 0416   08/03/22 1030  vancomycin (VANCOCIN) IVPB 1000 mg/200 mL premix        1,000 mg 200 mL/hr over 60 Minutes Intravenous NOW 08/03/22 0936 08/03/22 1656   08/02/22 2300  Ampicillin-Sulbactam (UNASYN) 3 g in sodium chloride 0.9 % 100 mL IVPB        3 g 200 mL/hr over 30 Minutes Intravenous Every 6  hours 08/02/22 2204 08/07/22 2159   08/02/22 2200  azithromycin (ZITHROMAX) 500 mg in sodium chloride 0.9 % 250 mL IVPB  Status:  Discontinued        500 mg 250 mL/hr over 60 Minutes Intravenous Every 24 hours 08/02/22 2153 08/04/22 1531   08/02/22 1845  vancomycin (VANCOREADY) IVPB 1750 mg/350 mL        1,750 mg 175 mL/hr over 120 Minutes Intravenous  Once  08/02/22 1831 08/02/22 2217   08/02/22 1830  ceFEPIme (MAXIPIME) 2 g in sodium chloride 0.9 % 100 mL IVPB        2 g 200 mL/hr over 30 Minutes Intravenous  Once 08/02/22 1823 08/02/22 2217   08/02/22 1830  vancomycin (VANCOCIN) IVPB 1000 mg/200 mL premix  Status:  Discontinued        1,000 mg 200 mL/hr over 60 Minutes Intravenous  Once 08/02/22 1823 08/02/22 1831       Subjective: Patient was seen and examined today.  No new concern.  Objective: Vitals:   08/05/22 1729 08/05/22 1929 08/06/22 0346 08/06/22 0801  BP: 134/81 133/70 (!) 159/75 (!) 146/90  Pulse: 69 68 70 66  Resp: 18 20 20 18   Temp: 98 F (36.7 C) 98.4 F (36.9 C) 98.5 F (36.9 C) 98 F (36.7 C)  TempSrc:  Oral Oral Oral  SpO2: 100% 100% 100% 100%  Weight:      Height:        Intake/Output Summary (Last 24 hours) at 08/06/2022 1455 Last data filed at 08/06/2022 0508 Gross per 24 hour  Intake 848.37 ml  Output 3050 ml  Net -2201.63 ml   Filed Weights   08/02/22 1653 08/03/22 0003 08/03/22 0944  Weight: 79 kg 60 kg 58.5 kg    Examination:  General.  Frail gentleman, in no acute distress. Pulmonary.  Lungs clear bilaterally, normal respiratory effort. CV.  Regular rate and rhythm, positive murmur Abdomen.  Soft, nontender, nondistended, BS positive. CNS.  Alert and oriented .  Right hemiparesis. Extremities.  Right AKA Psychiatry.  Judgment and insight appears normal.    Data Reviewed: I have personally reviewed following labs and imaging studies  CBC: Recent Labs  Lab 08/02/22 1720 08/03/22 0458 08/04/22 0519 08/05/22 0516 08/06/22 0552  WBC 13.0* 9.7 6.2 5.1 4.5  NEUTROABS 11.7*  --   --   --   --   HGB 12.6* 10.3* 9.8* 10.5* 9.9*  HCT 37.9* 31.2* 28.8* 30.7* 29.2*  MCV 88.1 88.6 86.5 86.5 85.9  PLT 278 215 203 233 200   Basic Metabolic Panel: Recent Labs  Lab 08/02/22 1720 08/03/22 0458 08/04/22 0519 08/05/22 0516 08/06/22 0552  NA 136 140 138 138 136  K 2.9* 3.0* 2.6* 3.6 3.2*   CL 100 106 103 103 102  CO2 26 28 28 28 28   GLUCOSE 236* 101* 76 153* 93  BUN 15 11 8 9 6   CREATININE 0.53* 0.38* 0.37* 0.40* 0.42*  CALCIUM 8.4* 8.3* 8.2* 8.2* 8.3*  MG  --   --  1.8 2.0 2.0   GFR: Estimated Creatinine Clearance: 81.3 mL/min (A) (by C-G formula based on SCr of 0.42 mg/dL (L)). Liver Function Tests: Recent Labs  Lab 08/02/22 1720  AST 18  ALT 12  ALKPHOS 72  BILITOT 0.8  PROT 8.1  ALBUMIN 3.0*   No results for input(s): "LIPASE", "AMYLASE" in the last 168 hours. No results for input(s): "AMMONIA" in the last 168 hours. Coagulation Profile: Recent Labs  Lab 08/02/22  1747 08/03/22 0458  INR 1.3* 1.4*   Cardiac Enzymes: No results for input(s): "CKTOTAL", "CKMB", "CKMBINDEX", "TROPONINI" in the last 168 hours. BNP (last 3 results) No results for input(s): "PROBNP" in the last 8760 hours. HbA1C: No results for input(s): "HGBA1C" in the last 72 hours.  CBG: Recent Labs  Lab 08/05/22 1133 08/05/22 1715 08/05/22 2107 08/06/22 0800 08/06/22 1117  GLUCAP 125* 116* 146* 75 137*   Lipid Profile: No results for input(s): "CHOL", "HDL", "LDLCALC", "TRIG", "CHOLHDL", "LDLDIRECT" in the last 72 hours. Thyroid Function Tests: No results for input(s): "TSH", "T4TOTAL", "FREET4", "T3FREE", "THYROIDAB" in the last 72 hours. Anemia Panel: No results for input(s): "VITAMINB12", "FOLATE", "FERRITIN", "TIBC", "IRON", "RETICCTPCT" in the last 72 hours. Sepsis Labs: Recent Labs  Lab 08/02/22 1720 08/02/22 1946 08/02/22 2339 08/03/22 0028 08/03/22 0456  PROCALCITON  --   --   --   --  <0.10  LATICACIDVEN 2.5* 2.3* 1.1 1.2  --     Recent Results (from the past 240 hour(s))  Culture, blood (Routine x 2)     Status: Abnormal   Collection Time: 08/02/22  5:20 PM   Specimen: BLOOD  Result Value Ref Range Status   Specimen Description   Final    BLOOD RIGHT ANTECUBITAL Performed at [facility], [address] [place], [place] [address]     Special Requests   Final    BOTTLES DRAWN AEROBIC AND ANAEROBIC Blood Culture adequate volume Performed at [facility], [address] [place], [place] [address]    Culture  Setup Time   Final    GRAM POSITIVE COCCI ANAEROBIC BOTTLE ONLY CRITICAL VALUE NOTED.  VALUE IS CONSISTENT WITH PREVIOUSLY REPORTED AND CALLED VALUE. ASW Performed at [facility], [address] [place], [place] [address]    Culture (A)  Final    STAPHYLOCOCCUS AUREUS SUSCEPTIBILITIES PERFORMED ON PREVIOUS CULTURE WITHIN THE LAST 5 DAYS. Performed at [facility], [address], [place], [place] [address]    Report Status 08/05/2022 FINAL  Final  Culture, blood (Routine x 2)     Status: Abnormal (Preliminary result)   Collection Time: 08/02/22  5:20 PM   Specimen: BLOOD  Result Value Ref Range Status   Specimen Description   Final    BLOOD LEFT ANTECUBITAL Performed at [facility], [address] [place], [place] [address]    Special Requests   Final    BOTTLES DRAWN AEROBIC AND ANAEROBIC Blood Culture adequate volume Performed at [facility], [address] [place], [place] [address]    Culture  Setup Time   Final    IN BOTH AEROBIC AND ANAEROBIC BOTTLES GRAM POSITIVE COCCI CRITICAL RESULT CALLED TO, READ BACK BY AND VERIFIED WITH: SHEEMA HALLAJI 08/03/22 @ 0843  BY SH    Culture (A)  Final    METHICILLIN RESISTANT STAPHYLOCOCCUS AUREUS Sent to [facility] for further susceptibility testing. Performed at [facility], [address] [place], [place] [address]    Report Status PENDING  Incomplete   Organism ID, Bacteria METHICILLIN RESISTANT STAPHYLOCOCCUS AUREUS  Final      Susceptibility   Methicillin resistant staphylococcus aureus - MIC*    CIPROFLOXACIN <=0.5 SENSITIVE Sensitive     ERYTHROMYCIN >=8 RESISTANT Resistant     GENTAMICIN <=0.5 SENSITIVE Sensitive     OXACILLIN >=4 RESISTANT Resistant     TETRACYCLINE <=1 SENSITIVE Sensitive      VANCOMYCIN 1 SENSITIVE Sensitive  TRIMETH/SULFA <=10 SENSITIVE Sensitive     CLINDAMYCIN <=0.25 SENSITIVE Sensitive     RIFAMPIN <=0.5 SENSITIVE Sensitive     Inducible Clindamycin NEGATIVE Sensitive     LINEZOLID 2 SENSITIVE Sensitive     * METHICILLIN RESISTANT STAPHYLOCOCCUS AUREUS  Blood Culture ID Panel (Reflexed)     Status: Abnormal   Collection Time: 08/02/22  5:20 PM  Result Value Ref Range Status   Enterococcus faecalis NOT DETECTED NOT DETECTED Final   Enterococcus Faecium NOT DETECTED NOT DETECTED Final   Listeria monocytogenes NOT DETECTED NOT DETECTED Final   Staphylococcus species DETECTED (A) NOT DETECTED Final    Comment: CRITICAL RESULT CALLED TO, READ BACK BY AND VERIFIED WITH: SHEEMA HALLAJI 08/03/22 @ 0843 BY SH    Staphylococcus aureus (BCID) DETECTED (A) NOT DETECTED Final    Comment: Methicillin (oxacillin)-resistant Staphylococcus aureus (MRSA). MRSA is predictably resistant to beta-lactam antibiotics (except ceftaroline). Preferred therapy is vancomycin unless clinically contraindicated. Patient requires contact precautions if  hospitalized. CRITICAL RESULT CALLED TO, READ BACK BY AND VERIFIED WITH: SHEEMA HALLAJI 08/03/22 @ 0843 BY SH    Staphylococcus epidermidis NOT DETECTED NOT DETECTED Final   Staphylococcus lugdunensis NOT DETECTED NOT DETECTED Final   Streptococcus species NOT DETECTED NOT DETECTED Final   Streptococcus agalactiae NOT DETECTED NOT DETECTED Final   Streptococcus pneumoniae NOT DETECTED NOT DETECTED Final   Streptococcus pyogenes NOT DETECTED NOT DETECTED Final   A.calcoaceticus-baumannii NOT DETECTED NOT DETECTED Final   Bacteroides fragilis NOT DETECTED NOT DETECTED Final   Enterobacterales NOT DETECTED NOT DETECTED Final   Enterobacter cloacae complex NOT DETECTED NOT DETECTED Final   Escherichia coli NOT DETECTED NOT DETECTED Final   Klebsiella aerogenes NOT DETECTED NOT DETECTED Final   Klebsiella oxytoca NOT DETECTED NOT  DETECTED Final   Klebsiella pneumoniae NOT DETECTED NOT DETECTED Final   Proteus species NOT DETECTED NOT DETECTED Final   Salmonella species NOT DETECTED NOT DETECTED Final   Serratia marcescens NOT DETECTED NOT DETECTED Final   Haemophilus influenzae NOT DETECTED NOT DETECTED Final   Neisseria meningitidis NOT DETECTED NOT DETECTED Final   Pseudomonas aeruginosa NOT DETECTED NOT DETECTED Final   Stenotrophomonas maltophilia NOT DETECTED NOT DETECTED Final   Candida albicans NOT DETECTED NOT DETECTED Final   Candida auris NOT DETECTED NOT DETECTED Final   Candida glabrata NOT DETECTED NOT DETECTED Final   Candida krusei NOT DETECTED NOT DETECTED Final   Candida parapsilosis NOT DETECTED NOT DETECTED Final   Candida tropicalis NOT DETECTED NOT DETECTED Final   Cryptococcus neoformans/gattii NOT DETECTED NOT DETECTED Final   Meth resistant mecA/C and MREJ DETECTED (A) NOT DETECTED Final    Comment: CRITICAL RESULT CALLED TO, READ BACK BY AND VERIFIED WITH: SHEEMA HALLAJI 08/03/22 @ 0843 BY SH Performed at [facility], [address] [place], [place] [address]   Respiratory (~20 pathogens) panel by PCR     Status: None   Collection Time: 08/02/22 11:51 PM   Specimen: Nasopharyngeal Swab; Respiratory  Result Value Ref Range Status   Adenovirus NOT DETECTED NOT DETECTED Final   Coronavirus 229E NOT DETECTED NOT DETECTED Final    Comment: (NOTE) The Coronavirus on the Respiratory Panel, DOES NOT test for the novel  Coronavirus (2019 nCoV)    Coronavirus HKU1 NOT DETECTED NOT DETECTED Final   Coronavirus NL63 NOT DETECTED NOT DETECTED Final   Coronavirus OC43 NOT DETECTED NOT DETECTED Final   Metapneumovirus NOT DETECTED NOT DETECTED Final   Rhinovirus / Enterovirus NOT DETECTED  NOT DETECTED Final   Influenza A NOT DETECTED NOT DETECTED Final   Influenza B NOT DETECTED NOT DETECTED Final   Parainfluenza Virus 1 NOT DETECTED NOT DETECTED Final   Parainfluenza Virus 2 NOT  DETECTED NOT DETECTED Final   Parainfluenza Virus 3 NOT DETECTED NOT DETECTED Final   Parainfluenza Virus 4 NOT DETECTED NOT DETECTED Final   Respiratory Syncytial Virus NOT DETECTED NOT DETECTED Final   Bordetella pertussis NOT DETECTED NOT DETECTED Final   Bordetella Parapertussis NOT DETECTED NOT DETECTED Final   Chlamydophila pneumoniae NOT DETECTED NOT DETECTED Final   Mycoplasma pneumoniae NOT DETECTED NOT DETECTED Final    Comment: Performed at [facility], [address], [place], [place] [address]  SARS Coronavirus 2 by RT PCR (hospital order, performed in [facility] hospital lab) *cepheid single result test*     Status: None   Collection Time: 08/02/22 11:51 PM  Result Value Ref Range Status   SARS Coronavirus 2 by RT PCR NEGATIVE NEGATIVE Final    Comment: (NOTE) SARS-CoV-2 target nucleic acids are NOT DETECTED.  The SARS-CoV-2 RNA is generally detectable in upper and lower respiratory specimens during the acute phase of infection. The lowest concentration of SARS-CoV-2 viral copies this assay can detect is 250 copies / mL. A negative result does not preclude SARS-CoV-2 infection and should not be used as the sole basis for treatment or other patient management decisions.  A negative result may occur with improper specimen collection / handling, submission of specimen other than nasopharyngeal swab, presence of viral mutation(s) within the areas targeted by this assay, and inadequate number of viral copies (<250 copies / mL). A negative result must be combined with clinical observations, patient history, and epidemiological information.  Fact Sheet for Patients:   RoadLapTop.co.za  Fact Sheet for Healthcare Providers: http://kim-miller.com/  This test is not yet approved or  cleared by the [place] FDA and has been authorized for detection and/or diagnosis of SARS-CoV-2 by FDA under an Emergency Use Authorization  (EUA).  This EUA will remain in effect (meaning this test can be used) for the duration of the COVID-19 declaration under Section 564(b)(1) of the Act, 21 U.S.C. section 360bbb-3(b)(1), unless the authorization is terminated or revoked sooner.  Performed at [facility], [address] [place], [place] [address]   Aerobic Culture w Gram Stain (superficial specimen)     Status: None (Preliminary result)   Collection Time: 08/03/22  8:28 PM   Specimen: Wound  Result Value Ref Range Status   Specimen Description   Final    WOUND Performed at [facility], [address], [place], [place] [address]    Special Requests   Final    LEFT FOOT Performed at [facility], [address] [place], [place] [address]    Gram Stain   Final    RARE WBC PRESENT, PREDOMINANTLY PMN FEW GRAM POSITIVE COCCI    Culture   Final    MODERATE STAPHYLOCOCCUS AUREUS RARE PROTEUS MIRABILIS CULTURE REINCUBATED FOR BETTER GROWTH Performed at [facility], [address] [place], [place] [address]    Report Status PENDING  Incomplete  MRSA Next Gen by PCR, Nasal     Status: Abnormal   Collection Time: 08/03/22 10:57 PM   Specimen: Nasal Mucosa; Nasal Swab  Result Value Ref Range Status   MRSA by PCR Next Gen DETECTED (A) NOT DETECTED Final    Comment: CRITICAL RESULT CALLED TO, READ BACK BY  AND VERIFIED WITH: BILL WENDT RN @0011  08/04/22 ASW (NOTE) The GeneXpert MRSA Assay (FDA approved for NASAL specimens only), is one component of a comprehensive MRSA colonization surveillance program. It is not intended to diagnose MRSA infection nor to guide or monitor treatment for MRSA infections. Test performance is not FDA approved in patients less than 79 years old. Performed at [facility], [address] [place], [place] [address]   Culture, blood (Routine X 2) w Reflex to ID Panel     Status: None (Preliminary result)   Collection Time: 08/04/22  5:19 AM    Specimen: BLOOD  Result Value Ref Range Status   Specimen Description BLOOD BLOOD RIGHT ARM  Final   Special Requests   Final    BOTTLES DRAWN AEROBIC AND ANAEROBIC Blood Culture adequate volume   Culture   Final    NO GROWTH 2 DAYS Performed at [facility], [address], [place], [place] [address]    Report Status PENDING  Incomplete  Culture, blood (Routine X 2) w Reflex to ID Panel     Status: None (Preliminary result)   Collection Time: 08/04/22  5:19 AM   Specimen: BLOOD  Result Value Ref Range Status   Specimen Description BLOOD BLOOD RIGHT HAND  Final   Special Requests   Final    BOTTLES DRAWN AEROBIC AND ANAEROBIC Blood Culture adequate volume   Culture   Final    NO GROWTH 2 DAYS Performed at [facility], [address], [place], [place] [address]    Report Status PENDING  Incomplete    Radiology Studies: ECHOCARDIOGRAM COMPLETE  Result Date: 08/05/2022    ECHOCARDIOGRAM REPORT   Patient Name:   MIR AMBER Date of Exam: 08/05/2022 Medical Rec #:  [id number]        Height:       67.0 in Accession #:    [id number]       Weight:       129.0 lb Date of Birth:  1960-08-18        BSA:          1.678 m Patient Age:    62 years         BP:           135/72 mmHg Patient Gender: M                HR:           73 bpm. Exam Location:  [facility] Procedure: 2D Echo Indications:     Bacteremia R78.81  History:         Patient has prior history of Echocardiogram examinations, most                  recent 04/26/2021.  Sonographer:     Overton Mam RDCS, FASE Referring Phys:  [id number] Lynn Ito Diagnosing Phys: Chilton Si MD IMPRESSIONS  1. Left ventricular ejection fraction, by estimation, is 55 to 60%. The left ventricle has normal function. The left ventricle has no regional wall motion abnormalities. There is mild concentric left ventricular hypertrophy. Left ventricular diastolic parameters are consistent with Grade I diastolic dysfunction (impaired  relaxation).  2. Right ventricular systolic function is normal. The right ventricular size is normal.  3. The mitral valve is normal in structure. Mild mitral valve regurgitation. No evidence of mitral stenosis.  4. The aortic valve is tricuspid. There is moderate calcification of the aortic valve. There is moderate thickening of the  aortic valve. Aortic valve regurgitation is mild to moderate. Mild aortic valve stenosis. Aortic regurgitation PHT measures 415 msec. Aortic valve area, by VTI measures 2.11 cm. Aortic valve mean gradient measures 11.0 mmHg. Aortic valve Vmax measures 2.22 m/s. FINDINGS  Left Ventricle: Left ventricular ejection fraction, by estimation, is 55 to 60%. The left ventricle has normal function. The left ventricle has no regional wall motion abnormalities. The left ventricular internal cavity size was normal in size. There is  mild concentric left ventricular hypertrophy. Left ventricular diastolic parameters are consistent with Grade I diastolic dysfunction (impaired relaxation). Normal left ventricular filling pressure. Right Ventricle: The right ventricular size is normal. No increase in right ventricular wall thickness. Right ventricular systolic function is normal. Left Atrium: Left atrial size was normal in size. Right Atrium: Right atrial size was normal in size. Pericardium: There is no evidence of pericardial effusion. Mitral Valve: The mitral valve is normal in structure. Mild mitral valve regurgitation. No evidence of mitral valve stenosis. Tricuspid Valve: The tricuspid valve is normal in structure. Tricuspid valve regurgitation is trivial. No evidence of tricuspid stenosis. Aortic Valve: The aortic valve is tricuspid. There is moderate calcification of the aortic valve. There is moderate thickening of the aortic valve. Aortic valve regurgitation is mild to moderate. Aortic regurgitation PHT measures 415 msec. Mild aortic stenosis is present. Aortic valve mean gradient measures  11.0 mmHg. Aortic valve peak gradient measures 19.7 mmHg. Aortic valve area, by VTI measures 2.11 cm. Pulmonic Valve: The pulmonic valve was normal in structure. Pulmonic valve regurgitation is not visualized. No evidence of pulmonic stenosis. Aorta: The aortic root is normal in size and structure. Venous: The inferior vena cava was not well visualized. IAS/Shunts: No atrial level shunt detected by color flow Doppler.  LEFT VENTRICLE PLAX 2D LVIDd:         4.40 cm   Diastology LVIDs:         3.10 cm   LV e' medial:    10.10 cm/s LV PW:         1.10 cm   LV E/e' medial:  7.8 LV IVS:        1.20 cm   LV e' lateral:   12.90 cm/s LVOT diam:     2.10 cm   LV E/e' lateral: 6.1 LV SV:         86 LV SV Index:   51 LVOT Area:     3.46 cm  RIGHT VENTRICLE RV Basal diam:  2.00 cm RV S prime:     15.70 cm/s TAPSE (M-mode): 2.2 cm LEFT ATRIUM             Index        RIGHT ATRIUM          Index LA diam:        3.00 cm 1.79 cm/m   RA Area:     9.99 cm LA Vol (A2C):   48.9 ml 29.14 ml/m  RA Volume:   20.10 ml 11.98 ml/m LA Vol (A4C):   29.7 ml 17.70 ml/m LA Biplane Vol: 37.8 ml 22.52 ml/m  AORTIC VALVE                     PULMONIC VALVE AV Area (Vmax):    1.89 cm      PV Vmax:       1.17 m/s AV Area (Vmean):   1.76 cm      PV Peak grad:  5.5 mmHg  AV Area (VTI):     2.11 cm AV Vmax:           222.00 cm/s AV Vmean:          153.000 cm/s AV VTI:            0.409 m AV Peak Grad:      19.7 mmHg AV Mean Grad:      11.0 mmHg LVOT Vmax:         121.00 cm/s LVOT Vmean:        77.800 cm/s LVOT VTI:          0.249 m LVOT/AV VTI ratio: 0.61 AI PHT:            415 msec  AORTA Ao Root diam: 3.50 cm Ao Asc diam:  3.50 cm MITRAL VALVE               TRICUSPID VALVE MV Area (PHT): 3.16 cm    TR Peak grad:   27.0 mmHg MV Decel Time: 240 msec    TR Vmax:        260.00 cm/s MV E velocity: 79.10 cm/s MV A velocity: 91.20 cm/s  SHUNTS MV E/A ratio:  0.87        Systemic VTI:  0.25 m                            Systemic Diam: 2.10 cm Chilton Si MD Electronically signed by Chilton Si MD Signature Date/Time: 08/05/2022/2:57:06 PM    Final     Scheduled Meds:  amLODipine  10 mg Oral Daily   apixaban  5 mg Oral BID   aspirin EC  81 mg Oral Daily   atorvastatin  40 mg Oral q1800   bethanechol  10 mg Oral TID   Chlorhexidine Gluconate Cloth  6 each Topical Q0600   finasteride  5 mg Oral Daily   insulin aspart  0-15 Units Subcutaneous TID WC   insulin aspart  0-5 Units Subcutaneous QHS   insulin glargine-yfgn  5 Units Subcutaneous QHS   mupirocin ointment  1 Application Nasal BID   sodium chloride flush  3 mL Intravenous Q12H   tamsulosin  0.4 mg Oral QPC supper   Continuous Infusions:  ampicillin-sulbactam (UNASYN) IV 3 g (08/06/22 0830)   levETIRAcetam 500 mg (08/06/22 1235)   vancomycin 1,000 mg (08/06/22 1058)     LOS: 4 days    Time spent: 40 mins.  This record has been created using Conservation officer, historic buildings. Errors have been sought and corrected,but may not always be located. Such creation errors do not reflect on the standard of care.   Arnetha Courser, MD [facility]   If 7PM-7AM, please contact night-coverage

## 2022-08-06 NOTE — Progress Notes (Signed)
Speech Language Pathology Treatment: Dysphagia  Patient Details Name: [name] MRN: [id number] DOB: 07/05/1960 Today's Date: 08/06/2022 Time: 4098-1191 SLP Time Calculation (min) (ACUTE ONLY): 10 min  Assessment / Plan / Recommendation Clinical Impression  Pt seen for ongoing dysphagia management and diet toleration. Pt was feeding himself dysphagia 2 lunch tray with thin liquids via straw. Pt partially reclined in bed. SLP assisted with increasing head of bed to more upright position. Skilled observation provided of pt consuming the above diet and liquid without any overt s/s of aspiration. NO delayed cough observed and no wet vocal quality observed. Secure chat sent to pt's attending and nurse with no report of observed concerns. At this time, current diet appears appropriate. ST to sign off at this time.     HPI HPI: Pt is a 62 y.o. male with medical history significant of prior/remote stroke, right hemiparesis with resultant Cognitive impairment, some degree of aphasia. During admit in 04/2021 per chart notes, Neurology consulted and evaluated by Dr. Amada Jupiter. No focal weakness or deficits, only generalized weakness. He strongly suspected Cognitive dysfunction due to some degree of Dementia. Patient also has a peripheral arterial disease, remote catheterization of the right lower extremity, patient has also had an amputation of the right lower extremity.  Patient was seen by physical therapy at home today and was noted to be markedly lethargic/somnolent.  EMS was called who subsequently found the patient to be febrile.  There is no report of patient having cough or tachypnea or vomiting or diarrhea or rash on his skin.  Patient is brought to ER for further evaluation.   CXR: Patchy left lung airspace disease concerning for pneumonia.  Low lung volumes.   MRI in 2023: Chronic small vessel ischemic disease with multiple chronic  lacunar infarcts, as detailed.  4. Small chronic infarcts within  bilateral cerebellar hemispheres.  5. Age advanced parenchymal atrophy, most notably affecting the  cerebrum and brainstem.      SLP Plan  All goals met      Recommendations for follow up therapy are one component of a multi-disciplinary discharge planning process, led by the attending physician.  Recommendations may be updated based on patient status, additional functional criteria and insurance authorization.    Recommendations  Diet recommendations: Dysphagia 2 (fine chop);Thin liquid Liquids provided via: Straw Medication Administration: Crushed with puree Supervision: Staff to assist with self feeding;Full supervision/cueing for compensatory strategies Compensations: Minimize environmental distractions;Slow rate;Small sips/bites Postural Changes and/or Swallow Maneuvers: Out of bed for meals;Seated upright 90 degrees;Upright 30-60 min after meal                  Oral care BID;Oral care before and after PO;Staff/trained caregiver to provide oral care   Frequent or constant Supervision/Assistance Dysphagia, oropharyngeal phase (R13.12)     All goals met    Hewitt Garner B. Dreama Saa, M.S., CCC-SLP, Tree surgeon Certified Brain Injury Specialist [facility]  [facility] Office [phone] Ascom [phone] Fax [phone]

## 2022-08-07 DIAGNOSIS — R7881 Bacteremia: Secondary | ICD-10-CM | POA: Diagnosis not present

## 2022-08-07 DIAGNOSIS — J189 Pneumonia, unspecified organism: Secondary | ICD-10-CM | POA: Diagnosis not present

## 2022-08-07 DIAGNOSIS — B9562 Methicillin resistant Staphylococcus aureus infection as the cause of diseases classified elsewhere: Secondary | ICD-10-CM | POA: Diagnosis not present

## 2022-08-07 DIAGNOSIS — I639 Cerebral infarction, unspecified: Secondary | ICD-10-CM | POA: Diagnosis not present

## 2022-08-07 DIAGNOSIS — E1151 Type 2 diabetes mellitus with diabetic peripheral angiopathy without gangrene: Secondary | ICD-10-CM | POA: Diagnosis not present

## 2022-08-07 DIAGNOSIS — E1165 Type 2 diabetes mellitus with hyperglycemia: Secondary | ICD-10-CM | POA: Diagnosis not present

## 2022-08-07 DIAGNOSIS — G9340 Encephalopathy, unspecified: Secondary | ICD-10-CM | POA: Diagnosis not present

## 2022-08-07 LAB — BASIC METABOLIC PANEL
Anion gap: 5 (ref 5–15)
BUN: 9 mg/dL (ref 6–20)
CO2: 29 mmol/L (ref 22–32)
Calcium: 8.4 mg/dL — ABNORMAL LOW (ref 8.9–10.3)
Chloride: 103 mmol/L (ref 98–111)
Creatinine, Ser: 0.4 mg/dL — ABNORMAL LOW (ref 0.61–1.24)
GFR, Estimated: >60 mL/min (ref >60)
Glucose, Bld: 92 mg/dL (ref 70–99)
Potassium: 3.5 mmol/L (ref 3.5–5.1)
Sodium: 137 mmol/L (ref 135–145)

## 2022-08-07 LAB — AEROBIC CULTURE W GRAM STAIN (SUPERFICIAL SPECIMEN)

## 2022-08-07 LAB — CBC
HCT: 30.8 % — ABNORMAL LOW (ref 39.0–52.0)
Hemoglobin: 10.4 g/dL — ABNORMAL LOW (ref 13.0–17.0)
MCH: 29.1 pg (ref 26.0–34.0)
MCHC: 33.8 g/dL (ref 30.0–36.0)
MCV: 86.3 fL (ref 80.0–100.0)
Platelets: 222 10*3/uL (ref 150–400)
RBC: 3.57 MIL/uL — ABNORMAL LOW (ref 4.22–5.81)
RDW: 12 % (ref 11.5–15.5)
WBC: 4.9 10*3/uL (ref 4.0–10.5)
nRBC: 0 % (ref 0.0–0.2)

## 2022-08-07 LAB — GLUCOSE, CAPILLARY
Glucose-Capillary: 74 mg/dL (ref 70–99)
Glucose-Capillary: 74 mg/dL (ref 70–99)
Glucose-Capillary: 92 mg/dL (ref 70–99)
Glucose-Capillary: 135 mg/dL — ABNORMAL HIGH (ref 70–99)
Glucose-Capillary: 183 mg/dL — ABNORMAL HIGH (ref 70–99)

## 2022-08-07 LAB — CULTURE, BLOOD (ROUTINE X 2)
Culture: NO GROWTH
Special Requests: ADEQUATE
Special Requests: ADEQUATE

## 2022-08-07 LAB — MAGNESIUM: Magnesium: 2.1 mg/dL (ref 1.7–2.4)

## 2022-08-07 MED ORDER — DEXTROSE-SODIUM CHLORIDE 5-0.9 % IV SOLN: INTRAVENOUS | Status: DC

## 2022-08-07 NOTE — Progress Notes (Signed)
PROGRESS NOTE    [name]  YNW:[id number] DOB: 10-21-1960 DOA: 08/02/2022 PCP: Marya Fossa, PA-C   Brief Narrative:  This 62 yrs old male with medical history significant of prior/remote stroke, right hemiparesis.  Patient also has a peripheral arterial disease, remote catheterization of the right lower extremity, patient has also had an amputation of the right lower extremity.  Patient was seen by physical therapy at home and was noted to be markedly lethargic/somnolent.  EMS was called who subsequently found the patient to be febrile.  Patient is admitted for severe sepsis found to have MRSA bacteremia,  ID is consulted.  Patient requires TEE.  7/14: Remained stable, TTE without any mentioning of valvular vegetations, cardiology was consulted for TEE  7/15: No new concern, TEE scheduled for tomorrow  Assessment & Plan:   Principal Problem:   Pneumonia Active Problems:   Uncontrolled type 2 diabetes mellitus with hyperglycemia, without long-term current use of insulin (HCC)   Stroke (HCC)   CVA (cerebral vascular accident) (HCC)   Acute pulmonary embolism (HCC)   MRSA bacteremia   Severe sepsis: MRSA bacteremia: Patient presented with fever, leukocytosis on presentation, elevated lactic acid, altered mentation.   Source, MRSA bacteremia. Possibly from pressure ulcer. Started on empiric IV vancomycin. Infectious diseases consulted. Encephalopathy has improved.  MRSA nares +. -TTE was without any mentioning of valvular vegetation -TEE tomorrow -Continue with vancomycin   Possible Pneumonia: Patient is not hypoxic.  CXR showed "Patchy left lung airspace disease concerning for pneumonia." Started on azithro and Unasyn on admission Continue Unasyn and vancomycin.  Zithromax discontinued.   Hx of PE: This is a remote diagnosis, patient has also had CVA, and also has had critical limb ischemia, Patient has therefore been maintained on apixaban chronically.   Continue  Eliquis   Hx of CVA (cerebral vascular accident) [facility]): This is a remote diagnosis, patient seems to be on Keppra chronically.  There is documentation of seizure disorder in the chart.   Continue Keppra as IV for now until mental status improves and better oral intake   Type 2 diabetes mellitus: Hold metformin --reduce glargine to 5u nightly --ACHS and SSI   Hypokalemia: Resolved.  Continue to monitor  DVT prophylaxis: Eliquis Code Status: Full code Family Communication: Discussed with patient Disposition Plan:    Status is: Inpatient Remains inpatient appropriate because: Admitted for severe sepsis found to have MRSA bacteremia,  source of infection from the heel.  Infectious disease consulted,  recommended long-term antibiotics.  TEE tomorrow    Consultants:  Infectious Diseases.  Cardiology  Procedures: None  Antimicrobials:  Anti-infectives (From admission, onward)    Start     Dose/Rate Route Frequency Ordered Stop   08/06/22 1000  vancomycin (VANCOCIN) IVPB 1000 mg/200 mL premix        1,000 mg 200 mL/hr over 60 Minutes Intravenous Every 12 hours 08/06/22 0416     08/03/22 2200  vancomycin (VANCOREADY) IVPB 750 mg/150 mL  Status:  Discontinued        750 mg 150 mL/hr over 60 Minutes Intravenous Every 12 hours 08/03/22 1109 08/06/22 0416   08/03/22 1030  vancomycin (VANCOCIN) IVPB 1000 mg/200 mL premix        1,000 mg 200 mL/hr over 60 Minutes Intravenous NOW 08/03/22 0936 08/03/22 1656   08/02/22 2300  Ampicillin-Sulbactam (UNASYN) 3 g in sodium chloride 0.9 % 100 mL IVPB        3 g 200 mL/hr over 30 Minutes Intravenous Every  6 hours 08/02/22 2204 08/07/22 2159   08/02/22 2200  azithromycin (ZITHROMAX) 500 mg in sodium chloride 0.9 % 250 mL IVPB  Status:  Discontinued        500 mg 250 mL/hr over 60 Minutes Intravenous Every 24 hours 08/02/22 2153 08/04/22 1531   08/02/22 1845  vancomycin (VANCOREADY) IVPB 1750 mg/350 mL        1,750 mg 175 mL/hr over 120  Minutes Intravenous  Once 08/02/22 1831 08/02/22 2217   08/02/22 1830  ceFEPIme (MAXIPIME) 2 g in sodium chloride 0.9 % 100 mL IVPB        2 g 200 mL/hr over 30 Minutes Intravenous  Once 08/02/22 1823 08/02/22 2217   08/02/22 1830  vancomycin (VANCOCIN) IVPB 1000 mg/200 mL premix  Status:  Discontinued        1,000 mg 200 mL/hr over 60 Minutes Intravenous  Once 08/02/22 1823 08/02/22 1831       Subjective: Patient was lying comfortably when seen today.  No new concern..  Objective: Vitals:   08/06/22 1930 08/07/22 0330 08/07/22 0759 08/07/22 0834  BP: 133/74 (!) 143/78 134/73 136/76  Pulse: 74 72 69 68  Resp: 16 20 16 18   Temp: 98.8 F (37.1 C) 98.4 F (36.9 C) 98.1 F (36.7 C)   TempSrc: Oral Oral Oral   SpO2: 99% 99% 100% 99%  Weight:      Height:        Intake/Output Summary (Last 24 hours) at 08/07/2022 1431 Last data filed at 08/07/2022 0400 Gross per 24 hour  Intake 1185.95 ml  Output 1350 ml  Net -164.05 ml   Filed Weights   08/02/22 1653 08/03/22 0003 08/03/22 0944  Weight: 79 kg 60 kg 58.5 kg    Examination:  General.  Frail gentleman, in no acute distress. Pulmonary.  Lungs clear bilaterally, normal respiratory effort. CV.  Regular rate and rhythm, positive murmur. Abdomen.  Soft, nontender, nondistended, BS positive. CNS.  Alert and oriented .  No focal neurologic deficit. Extremities.  No edema, right AKA Psychiatry.  Judgment and insight appears normal.     Data Reviewed: I have personally reviewed following labs and imaging studies  CBC: Recent Labs  Lab 08/02/22 1720 08/03/22 0458 08/04/22 0519 08/05/22 0516 08/06/22 0552 08/07/22 0446  WBC 13.0* 9.7 6.2 5.1 4.5 4.9  NEUTROABS 11.7*  --   --   --   --   --   HGB 12.6* 10.3* 9.8* 10.5* 9.9* 10.4*  HCT 37.9* 31.2* 28.8* 30.7* 29.2* 30.8*  MCV 88.1 88.6 86.5 86.5 85.9 86.3  PLT 278 215 203 233 200 222   Basic Metabolic Panel: Recent Labs  Lab 08/03/22 0458 08/04/22 0519  08/05/22 0516 08/06/22 0552 08/07/22 0446  NA 140 138 138 136 137  K 3.0* 2.6* 3.6 3.2* 3.5  CL 106 103 103 102 103  CO2 28 28 28 28 29   GLUCOSE 101* 76 153* 93 92  BUN 11 8 9 6 9   CREATININE 0.38* 0.37* 0.40* 0.42* 0.40*  CALCIUM 8.3* 8.2* 8.2* 8.3* 8.4*  MG  --  1.8 2.0 2.0 2.1   GFR: Estimated Creatinine Clearance: 81.3 mL/min (A) (by C-G formula based on SCr of 0.4 mg/dL (L)). Liver Function Tests: Recent Labs  Lab 08/02/22 1720  AST 18  ALT 12  ALKPHOS 72  BILITOT 0.8  PROT 8.1  ALBUMIN 3.0*   No results for input(s): "LIPASE", "AMYLASE" in the last 168 hours. No results for input(s): "AMMONIA" in  the last 168 hours. Coagulation Profile: Recent Labs  Lab 08/02/22 1747 08/03/22 0458  INR 1.3* 1.4*   Cardiac Enzymes: No results for input(s): "CKTOTAL", "CKMB", "CKMBINDEX", "TROPONINI" in the last 168 hours. BNP (last 3 results) No results for input(s): "PROBNP" in the last 8760 hours. HbA1C: No results for input(s): "HGBA1C" in the last 72 hours.  CBG: Recent Labs  Lab 08/06/22 1714 08/06/22 2108 08/07/22 0756 08/07/22 0834 08/07/22 1136  GLUCAP 109* 152* 74 74 92   Lipid Profile: No results for input(s): "CHOL", "HDL", "LDLCALC", "TRIG", "CHOLHDL", "LDLDIRECT" in the last 72 hours. Thyroid Function Tests: No results for input(s): "TSH", "T4TOTAL", "FREET4", "T3FREE", "THYROIDAB" in the last 72 hours. Anemia Panel: No results for input(s): "VITAMINB12", "FOLATE", "FERRITIN", "TIBC", "IRON", "RETICCTPCT" in the last 72 hours. Sepsis Labs: Recent Labs  Lab 08/02/22 1720 08/02/22 1946 08/02/22 2339 08/03/22 0028 08/03/22 0456  PROCALCITON  --   --   --   --  <0.10  LATICACIDVEN 2.5* 2.3* 1.1 1.2  --     Recent Results (from the past 240 hour(s))  Culture, blood (Routine x 2)     Status: Abnormal   Collection Time: 08/02/22  5:20 PM   Specimen: BLOOD  Result Value Ref Range Status   Specimen Description   Final    BLOOD RIGHT  ANTECUBITAL Performed at [facility], [address] [place], [place] [address]    Special Requests   Final    BOTTLES DRAWN AEROBIC AND ANAEROBIC Blood Culture adequate volume Performed at [facility], [address] [place], [place] [address]    Culture  Setup Time   Final    GRAM POSITIVE COCCI ANAEROBIC BOTTLE ONLY CRITICAL VALUE NOTED.  VALUE IS CONSISTENT WITH PREVIOUSLY REPORTED AND CALLED VALUE. ASW Performed at [facility], [address] [place], [place] [address]    Culture (A)  Final    STAPHYLOCOCCUS AUREUS SUSCEPTIBILITIES PERFORMED ON PREVIOUS CULTURE WITHIN THE LAST 5 DAYS. Performed at [facility], [address] [place], [place] [address]    Report Status 08/05/2022 FINAL  Final  Culture, blood (Routine x 2)     Status: Abnormal (Preliminary result)   Collection Time: 08/02/22  5:20 PM   Specimen: BLOOD  Result Value Ref Range Status   Specimen Description   Final    BLOOD LEFT ANTECUBITAL Performed at [facility], [address] [place], [place] [address]    Special Requests   Final    BOTTLES DRAWN AEROBIC AND ANAEROBIC Blood Culture adequate volume Performed at [facility], [address] [place], [place] [address]    Culture  Setup Time   Final    IN BOTH AEROBIC AND ANAEROBIC BOTTLES GRAM POSITIVE COCCI CRITICAL RESULT CALLED TO, READ BACK BY AND VERIFIED WITH: SHEEMA HALLAJI 08/03/22 @ 0843  BY SH    Culture (A)  Final    METHICILLIN RESISTANT STAPHYLOCOCCUS AUREUS Sent to [facility] for further susceptibility testing. Performed at [facility], [address], [place], [place] [address]    Report Status PENDING  Incomplete   Organism ID, Bacteria METHICILLIN RESISTANT STAPHYLOCOCCUS AUREUS  Final      Susceptibility   Methicillin resistant staphylococcus aureus - MIC*    CIPROFLOXACIN <=0.5 SENSITIVE Sensitive     ERYTHROMYCIN >=8 RESISTANT Resistant     GENTAMICIN <=0.5  SENSITIVE Sensitive     OXACILLIN >=4 RESISTANT Resistant     TETRACYCLINE <=1 SENSITIVE Sensitive  VANCOMYCIN 1 SENSITIVE Sensitive     TRIMETH/SULFA <=10 SENSITIVE Sensitive     CLINDAMYCIN <=0.25 SENSITIVE Sensitive     RIFAMPIN <=0.5 SENSITIVE Sensitive     Inducible Clindamycin NEGATIVE Sensitive     LINEZOLID 2 SENSITIVE Sensitive     * METHICILLIN RESISTANT STAPHYLOCOCCUS AUREUS  Blood Culture ID Panel (Reflexed)     Status: Abnormal   Collection Time: 08/02/22  5:20 PM  Result Value Ref Range Status   Enterococcus faecalis NOT DETECTED NOT DETECTED Final   Enterococcus Faecium NOT DETECTED NOT DETECTED Final   Listeria monocytogenes NOT DETECTED NOT DETECTED Final   Staphylococcus species DETECTED (A) NOT DETECTED Final    Comment: CRITICAL RESULT CALLED TO, READ BACK BY AND VERIFIED WITH: SHEEMA HALLAJI 08/03/22 @ 0843 BY SH    Staphylococcus aureus (BCID) DETECTED (A) NOT DETECTED Final    Comment: Methicillin (oxacillin)-resistant Staphylococcus aureus (MRSA). MRSA is predictably resistant to beta-lactam antibiotics (except ceftaroline). Preferred therapy is vancomycin unless clinically contraindicated. Patient requires contact precautions if  hospitalized. CRITICAL RESULT CALLED TO, READ BACK BY AND VERIFIED WITH: SHEEMA HALLAJI 08/03/22 @ 0843 BY SH    Staphylococcus epidermidis NOT DETECTED NOT DETECTED Final   Staphylococcus lugdunensis NOT DETECTED NOT DETECTED Final   Streptococcus species NOT DETECTED NOT DETECTED Final   Streptococcus agalactiae NOT DETECTED NOT DETECTED Final   Streptococcus pneumoniae NOT DETECTED NOT DETECTED Final   Streptococcus pyogenes NOT DETECTED NOT DETECTED Final   A.calcoaceticus-baumannii NOT DETECTED NOT DETECTED Final   Bacteroides fragilis NOT DETECTED NOT DETECTED Final   Enterobacterales NOT DETECTED NOT DETECTED Final   Enterobacter cloacae complex NOT DETECTED NOT DETECTED Final   Escherichia coli NOT DETECTED NOT DETECTED  Final   Klebsiella aerogenes NOT DETECTED NOT DETECTED Final   Klebsiella oxytoca NOT DETECTED NOT DETECTED Final   Klebsiella pneumoniae NOT DETECTED NOT DETECTED Final   Proteus species NOT DETECTED NOT DETECTED Final   Salmonella species NOT DETECTED NOT DETECTED Final   Serratia marcescens NOT DETECTED NOT DETECTED Final   Haemophilus influenzae NOT DETECTED NOT DETECTED Final   Neisseria meningitidis NOT DETECTED NOT DETECTED Final   Pseudomonas aeruginosa NOT DETECTED NOT DETECTED Final   Stenotrophomonas maltophilia NOT DETECTED NOT DETECTED Final   Candida albicans NOT DETECTED NOT DETECTED Final   Candida auris NOT DETECTED NOT DETECTED Final   Candida glabrata NOT DETECTED NOT DETECTED Final   Candida krusei NOT DETECTED NOT DETECTED Final   Candida parapsilosis NOT DETECTED NOT DETECTED Final   Candida tropicalis NOT DETECTED NOT DETECTED Final   Cryptococcus neoformans/gattii NOT DETECTED NOT DETECTED Final   Meth resistant mecA/C and MREJ DETECTED (A) NOT DETECTED Final    Comment: CRITICAL RESULT CALLED TO, READ BACK BY AND VERIFIED WITH: SHEEMA HALLAJI 08/03/22 @ 0843 BY SH Performed at [facility], [address] [place], [place] [address]   Respiratory (~20 pathogens) panel by PCR     Status: None   Collection Time: 08/02/22 11:51 PM   Specimen: Nasopharyngeal Swab; Respiratory  Result Value Ref Range Status   Adenovirus NOT DETECTED NOT DETECTED Final   Coronavirus 229E NOT DETECTED NOT DETECTED Final    Comment: (NOTE) The Coronavirus on the Respiratory Panel, DOES NOT test for the novel  Coronavirus (2019 nCoV)    Coronavirus HKU1 NOT DETECTED NOT DETECTED Final   Coronavirus NL63 NOT DETECTED NOT DETECTED Final   Coronavirus OC43 NOT DETECTED NOT DETECTED Final   Metapneumovirus NOT DETECTED NOT DETECTED  Final   Rhinovirus / Enterovirus NOT DETECTED NOT DETECTED Final   Influenza A NOT DETECTED NOT DETECTED Final   Influenza B NOT DETECTED NOT  DETECTED Final   Parainfluenza Virus 1 NOT DETECTED NOT DETECTED Final   Parainfluenza Virus 2 NOT DETECTED NOT DETECTED Final   Parainfluenza Virus 3 NOT DETECTED NOT DETECTED Final   Parainfluenza Virus 4 NOT DETECTED NOT DETECTED Final   Respiratory Syncytial Virus NOT DETECTED NOT DETECTED Final   Bordetella pertussis NOT DETECTED NOT DETECTED Final   Bordetella Parapertussis NOT DETECTED NOT DETECTED Final   Chlamydophila pneumoniae NOT DETECTED NOT DETECTED Final   Mycoplasma pneumoniae NOT DETECTED NOT DETECTED Final    Comment: Performed at [facility], [address] [place], [place] [address]  SARS Coronavirus 2 by RT PCR (hospital order, performed in [facility] hospital lab) *cepheid single result test*     Status: None   Collection Time: 08/02/22 11:51 PM  Result Value Ref Range Status   SARS Coronavirus 2 by RT PCR NEGATIVE NEGATIVE Final    Comment: (NOTE) SARS-CoV-2 target nucleic acids are NOT DETECTED.  The SARS-CoV-2 RNA is generally detectable in upper and lower respiratory specimens during the acute phase of infection. The lowest concentration of SARS-CoV-2 viral copies this assay can detect is 250 copies / mL. A negative result does not preclude SARS-CoV-2 infection and should not be used as the sole basis for treatment or other patient management decisions.  A negative result may occur with improper specimen collection / handling, submission of specimen other than nasopharyngeal swab, presence of viral mutation(s) within the areas targeted by this assay, and inadequate number of viral copies (<250 copies / mL). A negative result must be combined with clinical observations, patient history, and epidemiological information.  Fact Sheet for Patients:   RoadLapTop.co.za  Fact Sheet for Healthcare Providers: http://kim-miller.com/  This test is not yet approved or  cleared by the [place] FDA and has been  authorized for detection and/or diagnosis of SARS-CoV-2 by FDA under an Emergency Use Authorization (EUA).  This EUA will remain in effect (meaning this test can be used) for the duration of the COVID-19 declaration under Section 564(b)(1) of the Act, 21 U.S.C. section 360bbb-3(b)(1), unless the authorization is terminated or revoked sooner.  Performed at [facility] [place] [facility], [address], [place], [place] [address]   Aerobic Culture w Gram Stain (superficial specimen)     Status: None   Collection Time: 08/03/22  8:28 PM   Specimen: Wound  Result Value Ref Range Status   Specimen Description   Final    WOUND Performed at [facility], [address], [place], [place] [address]    Special Requests   Final    LEFT FOOT Performed at [facility], [address] [place], [place] [address]    Gram Stain   Final    RARE WBC PRESENT, PREDOMINANTLY PMN FEW GRAM POSITIVE COCCI    Culture   Final    MODERATE STAPHYLOCOCCUS AUREUS RARE PROTEUS MIRABILIS MODERATE CORYNEBACTERIUM STRIATUM Standardized susceptibility testing for this organism is not available. Performed at [facility], [address], [place], [place] [address]    Report Status 08/07/2022 FINAL  Final   Organism ID, Bacteria STAPHYLOCOCCUS AUREUS  Final   Organism ID, Bacteria PROTEUS MIRABILIS  Final      Susceptibility   Proteus mirabilis - MIC*    AMPICILLIN <=2 SENSITIVE Sensitive     CEFEPIME <=  0.12 SENSITIVE Sensitive     CEFTAZIDIME <=1 SENSITIVE Sensitive     CEFTRIAXONE <=0.25 SENSITIVE Sensitive     CIPROFLOXACIN <=0.25 SENSITIVE Sensitive     GENTAMICIN <=1 SENSITIVE Sensitive     IMIPENEM 2 SENSITIVE Sensitive     TRIMETH/SULFA >=320 RESISTANT Resistant     AMPICILLIN/SULBACTAM <=2 SENSITIVE Sensitive     PIP/TAZO <=4 SENSITIVE Sensitive     * RARE PROTEUS MIRABILIS   Staphylococcus aureus - MIC*    CIPROFLOXACIN <=0.5 SENSITIVE Sensitive     ERYTHROMYCIN >=8  RESISTANT Resistant     GENTAMICIN <=0.5 SENSITIVE Sensitive     OXACILLIN >=4 RESISTANT Resistant     TETRACYCLINE <=1 SENSITIVE Sensitive     VANCOMYCIN 1 SENSITIVE Sensitive     TRIMETH/SULFA <=10 SENSITIVE Sensitive     CLINDAMYCIN <=0.25 SENSITIVE Sensitive     RIFAMPIN <=0.5 SENSITIVE Sensitive     Inducible Clindamycin NEGATIVE Sensitive     LINEZOLID 2 SENSITIVE Sensitive     * MODERATE STAPHYLOCOCCUS AUREUS  MRSA Next Gen by PCR, Nasal     Status: Abnormal   Collection Time: 08/03/22 10:57 PM   Specimen: Nasal Mucosa; Nasal Swab  Result Value Ref Range Status   MRSA by PCR Next Gen DETECTED (A) NOT DETECTED Final    Comment: CRITICAL RESULT CALLED TO, READ BACK BY AND VERIFIED WITH: BILL WENDT RN @0011  08/04/22 ASW (NOTE) The GeneXpert MRSA Assay (FDA approved for NASAL specimens only), is one component of a comprehensive MRSA colonization surveillance program. It is not intended to diagnose MRSA infection nor to guide or monitor treatment for MRSA infections. Test performance is not FDA approved in patients less than 15 years old. Performed at [facility], [address] [place], [place] [address]   Culture, blood (Routine X 2) w Reflex to ID Panel     Status: None (Preliminary result)   Collection Time: 08/04/22  5:19 AM   Specimen: BLOOD  Result Value Ref Range Status   Specimen Description BLOOD BLOOD RIGHT ARM  Final   Special Requests   Final    BOTTLES DRAWN AEROBIC AND ANAEROBIC Blood Culture adequate volume   Culture   Final    NO GROWTH 3 DAYS Performed at [facility], [address], [place], [place] [address]    Report Status PENDING  Incomplete  Culture, blood (Routine X 2) w Reflex to ID Panel     Status: None (Preliminary result)   Collection Time: 08/04/22  5:19 AM   Specimen: BLOOD  Result Value Ref Range Status   Specimen Description BLOOD BLOOD RIGHT HAND  Final   Special Requests   Final    BOTTLES DRAWN AEROBIC AND  ANAEROBIC Blood Culture adequate volume   Culture   Final    NO GROWTH 3 DAYS Performed at [facility], [address], [place], [place] [address]    Report Status PENDING  Incomplete    Radiology Studies: No results found.  Scheduled Meds:  amLODipine  10 mg Oral Daily   apixaban  5 mg Oral BID   aspirin EC  81 mg Oral Daily   atorvastatin  40 mg Oral q1800   bethanechol  10 mg Oral TID   Chlorhexidine Gluconate Cloth  6 each Topical Q0600   finasteride  5 mg Oral Daily   insulin aspart  0-15 Units Subcutaneous TID WC   insulin aspart  0-5 Units Subcutaneous QHS   insulin glargine-yfgn  5 Units  Subcutaneous QHS   mupirocin ointment  1 Application Nasal BID   sodium chloride flush  3 mL Intravenous Q12H   tamsulosin  0.4 mg Oral QPC supper   Continuous Infusions:  ampicillin-sulbactam (UNASYN) IV 3 g (08/07/22 0940)   dextrose 5 % and 0.9 % NaCl 50 mL/hr at 08/07/22 0902   levETIRAcetam 500 mg (08/07/22 1224)   vancomycin 1,000 mg (08/07/22 0946)     LOS: 5 days    Time spent: 39 mins.  This record has been created using Conservation officer, historic buildings. Errors have been sought and corrected,but may not always be located. Such creation errors do not reflect on the standard of care.   Arnetha Courser, MD [facility]   If 7PM-7AM, please contact night-coverage

## 2022-08-07 NOTE — Progress Notes (Signed)
Date of Admission:  08/02/2022     ID: [name] is a 62 y.o. male  Principal Problem:   Pneumonia Active Problems:   Uncontrolled type 2 diabetes mellitus with hyperglycemia, without long-term current use of insulin (HCC)   Stroke (HCC)   CVA (cerebral vascular accident) (HCC)   Acute pulmonary embolism (HCC)   MRSA bacteremia    Subjective: Pt says he is okay  Medications:   amLODipine  10 mg Oral Daily   apixaban  5 mg Oral BID   aspirin EC  81 mg Oral Daily   atorvastatin  40 mg Oral q1800   bethanechol  10 mg Oral TID   Chlorhexidine Gluconate Cloth  6 each Topical Q0600   finasteride  5 mg Oral Daily   insulin aspart  0-15 Units Subcutaneous TID WC   insulin aspart  0-5 Units Subcutaneous QHS   insulin glargine-yfgn  5 Units Subcutaneous QHS   mupirocin ointment  1 Application Nasal BID   sodium chloride flush  3 mL Intravenous Q12H   tamsulosin  0.4 mg Oral QPC supper    Objective: Vital signs in last 24 hours: Patient Vitals for the past 24 hrs:  BP Temp Temp src Pulse Resp SpO2  08/07/22 0834 136/76 -- -- 68 18 99 %  08/07/22 0759 134/73 98.1 F (36.7 C) Oral 69 16 100 %  08/07/22 0330 (!) 143/78 98.4 F (36.9 C) Oral 72 20 99 %  08/06/22 1930 133/74 98.8 F (37.1 C) Oral 74 16 99 %  08/06/22 1542 (!) 149/77 98.2 F (36.8 C) Oral 72 18 98 %      PHYSICAL EXAM:  General: Alert, cooperative, no distress, oriented in person, place Lungs: b/l air entry  No Wheezing or Rhonchi. few Heart: Regular rate and rhythm, no murmur, rub or gallop. Abdomen: Soft, non-tender,not distended. Bowel sounds normal. No masses Extremities: atraumatic, no cyanosis. No edema. No clubbing Skin: No rashes or lesions. Or bruising Lymph: Cervical, supraclavicular normal. Neurologic: rt hemiparesis  Lab Results    Latest Ref Rng & Units 08/07/2022    4:46 AM 08/06/2022    5:52 AM 08/05/2022    5:16 AM  CBC  WBC 4.0 - 10.5 K/uL 4.9  4.5  5.1   Hemoglobin 13.0 -  17.0 g/dL 16.1  9.9  09.6   Hematocrit 39.0 - 52.0 % 30.8  29.2  30.7   Platelets 150 - 400 K/uL 222  200  233        Latest Ref Rng & Units 08/07/2022    4:46 AM 08/06/2022    5:52 AM 08/05/2022    5:16 AM  CMP  Glucose 70 - 99 mg/dL 92  93  045   BUN 6 - 20 mg/dL 9  6  9    Creatinine 0.61 - 1.24 mg/dL 4.09  8.11  9.14   Sodium 135 - 145 mmol/L 137  136  138   Potassium 3.5 - 5.1 mmol/L 3.5  3.2  3.6   Chloride 98 - 111 mmol/L 103  102  103   CO2 22 - 32 mmol/L 29  28  28    Calcium 8.9 - 10.3 mg/dL 8.4  8.3  8.2       Microbiology: 7/10 BC- MRSA 08/04/22 BC NG so far    Impression/Recommendation ? Encephalopathy Much improved    MRSA bacteremia Source  left heel wound. repeat blood culture TEE is planned for tomorrow   Left lower lobe infiltrate  MRSA nares swab+   Patient is currently on vancomycin    History of right AKA   History of PAD in June he underwent angioplasty and stent placement in the left SFA, tibioperoneal trunk and posterior tibial.   Diabetes   History of CVA with right hemiparesis   Discussed the management with the patient and care team

## 2022-08-07 NOTE — Plan of Care (Signed)

## 2022-08-07 NOTE — Plan of Care (Signed)
   Problem: Education: Goal: Ability to describe self-care measures that may prevent or decrease complications (Diabetes Survival Skills Education) will improve Outcome: Progressing   Problem: Coping: Goal: Ability to adjust to condition or change in health will improve Outcome: Progressing   Problem: Fluid Volume: Goal: Ability to maintain a balanced intake and output will improve Outcome: Progressing   

## 2022-08-08 ENCOUNTER — Other Ambulatory Visit: Payer: Self-pay

## 2022-08-08 ENCOUNTER — Encounter
Admission: EM | Disposition: A | Discharge status: Home Health | Payer: Self-pay | Source: Home / Self Care | Attending: Internal Medicine

## 2022-08-08 ENCOUNTER — Inpatient Hospital Stay (HOSPITAL_COMMUNITY)
Admit: 2022-08-08 | Discharge: 2022-08-08 | Disposition: A | Discharge status: Home / Self Care | Payer: Medicare Other | Attending: Cardiology | Admitting: Cardiology

## 2022-08-08 DIAGNOSIS — R7881 Bacteremia: Secondary | ICD-10-CM

## 2022-08-08 DIAGNOSIS — I350 Nonrheumatic aortic (valve) stenosis: Secondary | ICD-10-CM

## 2022-08-08 DIAGNOSIS — L8990 Pressure ulcer of unspecified site, unspecified stage: Secondary | ICD-10-CM | POA: Insufficient documentation

## 2022-08-08 DIAGNOSIS — E1165 Type 2 diabetes mellitus with hyperglycemia: Secondary | ICD-10-CM | POA: Diagnosis not present

## 2022-08-08 DIAGNOSIS — J189 Pneumonia, unspecified organism: Secondary | ICD-10-CM | POA: Diagnosis not present

## 2022-08-08 DIAGNOSIS — I361 Nonrheumatic tricuspid (valve) insufficiency: Secondary | ICD-10-CM | POA: Diagnosis not present

## 2022-08-08 DIAGNOSIS — I639 Cerebral infarction, unspecified: Secondary | ICD-10-CM | POA: Diagnosis not present

## 2022-08-08 HISTORY — PX: TEE WITHOUT CARDIOVERSION: SHX5443

## 2022-08-08 LAB — CBC
HCT: 30.2 % — ABNORMAL LOW (ref 39.0–52.0)
Hemoglobin: 10 g/dL — ABNORMAL LOW (ref 13.0–17.0)
MCH: 28.9 pg (ref 26.0–34.0)
MCHC: 33.1 g/dL (ref 30.0–36.0)
MCV: 87.3 fL (ref 80.0–100.0)
Platelets: 212 10*3/uL (ref 150–400)
RBC: 3.46 MIL/uL — ABNORMAL LOW (ref 4.22–5.81)
RDW: 12.2 % (ref 11.5–15.5)
WBC: 5.1 10*3/uL (ref 4.0–10.5)
nRBC: 0 % (ref 0.0–0.2)

## 2022-08-08 LAB — BASIC METABOLIC PANEL
Anion gap: 6 (ref 5–15)
BUN: 8 mg/dL (ref 6–20)
CO2: 30 mmol/L (ref 22–32)
Calcium: 8.2 mg/dL — ABNORMAL LOW (ref 8.9–10.3)
Chloride: 102 mmol/L (ref 98–111)
Creatinine, Ser: 0.45 mg/dL — ABNORMAL LOW (ref 0.61–1.24)
GFR, Estimated: >60 mL/min (ref >60)
Glucose, Bld: 102 mg/dL — ABNORMAL HIGH (ref 70–99)
Potassium: 3.5 mmol/L (ref 3.5–5.1)
Sodium: 138 mmol/L (ref 135–145)

## 2022-08-08 LAB — GLUCOSE, CAPILLARY
Glucose-Capillary: 175 mg/dL — ABNORMAL HIGH (ref 70–99)
Glucose-Capillary: 64 mg/dL — ABNORMAL LOW (ref 70–99)
Glucose-Capillary: 118 mg/dL — ABNORMAL HIGH (ref 70–99)
Glucose-Capillary: 118 mg/dL — ABNORMAL HIGH (ref 70–99)
Glucose-Capillary: 142 mg/dL — ABNORMAL HIGH (ref 70–99)

## 2022-08-08 LAB — ECHO TEE
AV Mean grad: 10 mmHg
AV Peak grad: 17 mmHg
Ao pk vel: 2.06 m/s

## 2022-08-08 LAB — MAGNESIUM: Magnesium: 1.9 mg/dL (ref 1.7–2.4)

## 2022-08-08 LAB — MISC LABCORP TEST (SEND OUT)
LabCorp test name: 96388
Labcorp test code: 96388

## 2022-08-08 SURGERY — TRANSESOPHAGEAL ECHOCARDIOGRAM (TEE)
Anesthesia: Moderate Sedation

## 2022-08-08 MED ORDER — FENTANYL CITRATE (PF) 100 MCG/2ML IJ SOLN
INTRAMUSCULAR | Status: AC
  Filled 2022-08-08: qty 2

## 2022-08-08 MED ORDER — LIDOCAINE VISCOUS HCL 2 % MT SOLN
OROMUCOSAL | Status: AC
  Filled 2022-08-08: qty 15

## 2022-08-08 MED ORDER — MIDAZOLAM HCL 2 MG/2ML IJ SOLN
INTRAMUSCULAR | Status: AC | PRN
  Administered 2022-08-08: 1 mg via INTRAVENOUS

## 2022-08-08 MED ORDER — FENTANYL CITRATE (PF) 100 MCG/2ML IJ SOLN
INTRAMUSCULAR | Status: AC | PRN
  Administered 2022-08-08: 25 ug via INTRAVENOUS

## 2022-08-08 MED ORDER — MIDAZOLAM HCL 2 MG/2ML IJ SOLN
INTRAMUSCULAR | Status: AC
  Filled 2022-08-08: qty 4

## 2022-08-08 MED ORDER — SODIUM CHLORIDE 0.9 % IV SOLN: INTRAVENOUS | Status: DC

## 2022-08-08 MED ORDER — LEVETIRACETAM 500 MG PO TABS
500.0000 mg | ORAL_TABLET | Freq: Two times a day (BID) | ORAL | Status: DC
  Administered 2022-08-08 – 2022-08-09 (×3): 500 mg via ORAL
  Filled 2022-08-08 (×3): qty 1

## 2022-08-08 MED ORDER — SODIUM CHLORIDE 0.9 % IV SOLN
8.0000 mg/kg | Freq: Every day | INTRAVENOUS | Status: DC
  Administered 2022-08-08: 450 mg via INTRAVENOUS
  Filled 2022-08-08 (×2): qty 9

## 2022-08-08 MED ORDER — DEXTROSE 50 % IV SOLN
12.5000 g | Freq: Once | INTRAVENOUS | Status: AC
  Administered 2022-08-08: 12.5 g via INTRAVENOUS
  Filled 2022-08-08: qty 50

## 2022-08-08 MED ORDER — SODIUM CHLORIDE FLUSH 0.9 % IV SOLN
INTRAVENOUS | Status: AC
  Filled 2022-08-08: qty 10

## 2022-08-08 NOTE — Progress Notes (Addendum)
Physical Therapy Treatment Patient Details Name: [name] MRN: [id number] DOB: 09-27-1960 Today's Date: 08/08/2022   History of Present Illness 62 y.o old male with medical history significant of prior/remote stroke, right hemiparesis.  Patient also has a peripheral arterial disease, remote catheterization of the right lower extremity, R AKA. Pt admitted for severe sepsis and MRSA    PT Comments  Pt presented laying in bed, current pain 5/10 in sacral area where wound is. Pt able to briefly lift LLE and R limb to assist with preparing for bed mobility. Pt willing to perform TherEx but needed some encouragement to sit EOB. Pt required MaxAx2 for supine<>sit transfers and maintain midline when sitting EOB. Pt was able to tolerate sitting EOB, but had a prominent left posterior lean and was unable to independently correct with verbal or tactile cueing.  Pt showed effort to participate throughout session but limited by UE/LE weakness and fatigue. Pain levels did not change throughout visit. Pt would benefit from continued skilled therapy to address balance deficits, mobility deficits, and decreased activity tolerance.     Assistance Recommended at Discharge    If plan is discharge home, recommend the following:  Can travel by private vehicle    Two people to help with walking and/or transfers;Assistance with cooking/housework;Assistance with feeding;Direct supervision/assist for medications management;Assist for transportation;Help with stairs or ramp for entrance;Two people to help with bathing/dressing/bathroom   No  Equipment Recommendations  None recommended by PT    Recommendations for Other Services OT consult     Precautions / Restrictions Precautions Precautions: Fall Restrictions Weight Bearing Restrictions: No     Mobility  Bed Mobility Overal bed mobility: Needs Assistance Bed Mobility: Supine to Sit     Supine to sit: Max assist, +2 for physical assistance      General bed mobility comments: Max Ax2 for verbal/tactile cueing, guide LE off EOB, and maintain trunk control    Transfers                   General transfer comment: Unable to assess due to decreased activity tolerance in sitting, Pt performs slide board transfers at baseline to [facility]    Ambulation/Gait                   Stairs             Wheelchair Mobility     Tilt Bed    Modified Rankin (Stroke Patients Only)       Balance Overall balance assessment: Needs assistance Sitting-balance support: Bilateral upper extremity supported, Feet supported Sitting balance-Leahy Scale: Zero Sitting balance - Comments: Requires maxAx2 to maintain upright sitting. PT used verbal cueing and positioning to maximize patient contribution but pt unable to correct lateral lean independently with UE. Postural control: Posterior lean, Left lateral lean     Standing balance comment: Pt does not stand/ walk at baseline. Unable to assess                            Cognition Arousal/Alertness: Awake/alert Behavior During Therapy: Flat affect, WFL for tasks assessed/performed Overall Cognitive Status: No family/caregiver present to determine baseline cognitive functioning                                          Exercises General Exercises - Lower Extremity Ankle Circles/Pumps:  AROM, Left, 20 reps, Seated (required maxA for trunk stability in sitting) Quad Sets: AROM, Left, 10 reps, Supine Heel Raises: Strengthening, Left, 10 reps, Seated (required maxA for trunk stability in sitting) Other Exercises Other Exercises: Attempted forward leaning while sitting EOB, pt showed effort to move but was unable to move trunk forward    General Comments General comments (skin integrity, edema, etc.): current L heel and sacral wound      Pertinent Vitals/Pain Pain Assessment Pain Assessment: 0-10 Pain Score: 5  Pain Location: sacral wound Pain  Descriptors / Indicators: Constant, Discomfort, Pressure Pain Intervention(s): Monitored during session    Home Living                          Prior Function            PT Goals (current goals can now be found in the care plan section) Acute Rehab PT Goals Patient Stated Goal: unable to state PT Goal Formulation: With patient Time For Goal Achievement: 08/18/22 Potential to Achieve Goals: Poor Progress towards PT goals: PT to reassess next treatment    Frequency    Min 1X/week      PT Plan Current plan remains appropriate    Co-evaluation              AM-PAC PT "6 Clicks" Mobility   Outcome Measure  Help needed turning from your back to your side while in a flat bed without using bedrails?: A Lot Help needed moving from lying on your back to sitting on the side of a flat bed without using bedrails?: A Lot Help needed moving to and from a bed to a chair (including a wheelchair)?: Total Help needed standing up from a chair using your arms (e.g., wheelchair or bedside chair)?: Total Help needed to walk in hospital room?: Total Help needed climbing 3-5 steps with a railing? : Total 6 Click Score: 8    End of Session   Activity Tolerance: Patient limited by fatigue Patient left: in bed;with call bell/phone within reach;with bed alarm set   PT Visit Diagnosis: Muscle weakness (generalized) (M62.81);Difficulty in walking, not elsewhere classified (R26.2);Pain     Time: 1404-1430 PT Time Calculation (min) (ACUTE ONLY): 26 min  Charges:    $Therapeutic Exercise: 8-22 mins $Therapeutic Activity: 8-22 mins PT General Charges $$ ACUTE PT VISIT: 1 Visit                     Nalani Andreen, PT, SPT 3:23 PM,08/08/22

## 2022-08-08 NOTE — Procedures (Addendum)
Transesophageal Echocardiogram :  Indication: bacteremia Requesting/ordering  physician:   Procedure: 10 ml of viscous lidocaine were given orally to provide local anesthesia to the oropharynx. The patient was positioned supine on the left side, bite block provided. The patient was moderately sedated with the doses of versed and fentanyl as detailed below.  Using digital technique an omniplane probe was advanced into the esophagus without incident.   Moderate sedation: 1. Sedation used:  Versed: 1mg , Fentanyl: 25 mcg 2. Time administered:  7:45 am, Time when patient started recovery: 8:07 am 3. I was face to face during this time,  See report in EPIC  for complete details: In brief, imaging revealed normal LV function with no RWMAs and no mural apical thrombus.  .  Estimated ejection fraction was 55%.  Right sided cardiac chambers were normal with no evidence of pulmonary hypertension.  There is no evidence for vegetation or endocarditis.  Imaging of the septum showed no ASD or VSD Bubble study was negative for shunt 2D and color flow confirmed no PFO  The LA was well visualized in orthogonal views.  There was no spontaneous contrast and no thrombus in the LA and LA appendage   The descending thoracic aorta had mild calcifications but no evidence of aneurysmal dilation or disection  Conclusion No evidence for endocarditis  Debbe Odea 08/08/2022 8:12 AM

## 2022-08-08 NOTE — Care Management Important Message (Signed)
Important Message  Patient Details  Name: [name] MRN: [id number] Date of Birth: [birth year]   Medicare Important Message Given:  Other (see comment)  Patient is in an isolation room and I tried calling him  [phone]) but there was no answer. I also contacted his sister, [name] [phone] that has been helping him and reviewed the Important Message from Medicare with her.. She stated he didn't want to go to Rehab.and opting to go home. I thanked her for time and wished him well.   Olegario Messier A Arnetia Bronk 08/08/2022, 2:55 PM

## 2022-08-08 NOTE — Plan of Care (Signed)

## 2022-08-08 NOTE — Progress Notes (Signed)
*  PRELIMINARY RESULTS* Echocardiogram Echocardiogram Transesophageal has been performed.  [name] 08/08/2022, 8:24 AM

## 2022-08-08 NOTE — Consult Note (Addendum)
[facility] Customer service manager [facility]) Accountable Care Organization (ACO) [facility] Liaison Note  08/08/2022  ELIZAR ALPERN [birth year] [id number]  Location: [facility] RN Hospital Liaison screened the patient remotely at [facility].  Insurance:  Medicare   [name] is a 62 y.o. male who is a Primary Care Patient of [facility]). The patient was screened for  readmission hospitalization with noted medium risk score for unplanned readmission risk with 1 IP in 6 months.   The patient was assessed for potential [facility]) Care Management service needs for post hospital transition for care coordination. Review of patient's electronic medical record reveals patient was admitted for Pneumonia. Bed search began for recommended SNF placement. Pt does not have a provider with [facility] and is not eligible for services.     [facility] Care Management/Population Health does not replace or interfere with any arrangements made by the Inpatient Transition of Care team.   For questions contact:   Elliot Cousin, RN, [facility] Liaison [place]   Population Health Office Hours MTWF  8:00 am-6:00 pm Off on Thursday [phone] mobile [phone] [Office toll free line] Office Hours are M-F 8:30 - 5 pm 24 hour nurse advise line [phone] Concierge  [email or web address]

## 2022-08-08 NOTE — Inpatient Diabetes Management (Signed)
Inpatient Diabetes Program Recommendations  AACE/[name]: New Consensus Statement on Inpatient Glycemic Control (2015)  Target Ranges:  Prepandial:   less than 140 mg/dL      Peak postprandial:   less than 180 mg/dL (1-2 hours)      Critically ill patients:  140 - 180 mg/dL   Lab Results  Component Value Date   GLUCAP 175 (H) 08/08/2022   HGBA1C 6.4 (H) 08/03/2022    Review of Glycemic Control  Latest Reference Range & Units 08/07/22 07:56 08/07/22 08:34 08/07/22 11:36 08/07/22 16:12 08/07/22 22:01 08/08/22 08:48  Glucose-Capillary 70 - 99 mg/dL 74 74 92 782 (H) 956 (H) 64 (L)  (H): Data is abnormally high (L): Data is abnormally low  Diabetes history: DM2 Outpatient Diabetes medications:  Semglee 10 units every day, Metformin 500 mg BID Current orders for Inpatient glycemic control: Semglee 5 units every day, Novolog 0-15 units TID and 0-5 units QHS  Inpatient Diabetes Program Recommendations:    Glucose is trending below hospital goal.  Might consider discontinuing basal insulin while inpatient.    Will continue to follow while inpatient.  Thank you, Dulce Sellar, MSN, CDCES Diabetes Coordinator Inpatient Diabetes Program [phone] (team pager from 8a-5p)

## 2022-08-08 NOTE — TOC Progression Note (Signed)
Transition of Care [facility]) - Progression Note    Patient Details  Name: [name] MRN: [id number] Date of Birth: 01-15-1961  Transition of Care [facility]) CM/SW Contact  Chapman Fitch, RN Phone Number: 08/08/2022, 11:54 AM  Clinical Narrative:      Follow up with sister [name] to provide additional bed offer at [facility].  She declines and states her wishes are for patient to return home at discharge She states that if IV antibiotics are indicated at discharge she will consider SNF      Expected Discharge Plan and Services                                               Social Determinants of Health (SDOH) Interventions SDOH Screenings   Food Insecurity: No Food Insecurity (08/03/2022)  Housing: Low Risk  (08/03/2022)  Transportation Needs: No Transportation Needs (08/03/2022)  Utilities: Not At Risk (08/03/2022)  Tobacco Use: High Risk (08/03/2022)    Readmission Risk Interventions     No data to display

## 2022-08-08 NOTE — TOC Progression Note (Signed)
Transition of Care [facility]) - Progression Note    Patient Details  Name: [name] MRN: [id number] Date of Birth: [birth year]  Transition of Care [facility]) CM/SW Contact  Chapman Fitch, RN Phone Number: 08/08/2022, 3:13 PM  Clinical Narrative:     MD confirmed that patient would need IV antibiotics at discharge  Met with patient in room, and with patient permission called sister [name] on speaker phone  They are both aware that patient will require IV antibiotics at discharge.  They are both in agreement that they wish for patient to go home at discharge  Per sister patients caregiver that is in the home 3 times a day as agreed to administer the IV antibiotics.  Care giver name is [name]  [phone]  They are aware that [name] will have to come to the hospital for infusion education prior to discharge  Referral made to [facility] with [name] with [facility] updated        Expected Discharge Plan and Services                                               Social Determinants of Health (SDOH) Interventions SDOH Screenings   Food Insecurity: No Food Insecurity (08/03/2022)  Housing: Low Risk  (08/03/2022)  Transportation Needs: No Transportation Needs (08/03/2022)  Utilities: Not At Risk (08/03/2022)  Tobacco Use: High Risk (08/03/2022)    Readmission Risk Interventions     No data to display

## 2022-08-08 NOTE — Progress Notes (Signed)
PROGRESS NOTE    [name]  HYQ:[id number] DOB: 12/17/1960 DOA: 08/02/2022 PCP: Marya Fossa, PA-C   Brief Narrative:  This 62 yrs old male with medical history significant of prior/remote stroke, right hemiparesis.  Patient also has a peripheral arterial disease, remote catheterization of the right lower extremity, patient has also had an amputation of the right lower extremity.  Patient was seen by physical therapy at home and was noted to be markedly lethargic/somnolent.  EMS was called who subsequently found the patient to be febrile.  Patient is admitted for severe sepsis found to have MRSA bacteremia,  ID is consulted.  Patient requires TEE.  7/14: Remained stable, TTE without any mentioning of valvular vegetations, cardiology was consulted for TEE  7/15: No new concern, TEE scheduled for tomorrow.  7/16: TEE today was negative for any concern of endocarditis.  ID is recommending IV antibiotics until 08/29/2022.  Might not be able to go to SNF as have used his days.  TOC is exploring options with family.  Patient wants to go home  Assessment & Plan:   Principal Problem:   MRSA bacteremia Active Problems:   Uncontrolled type 2 diabetes mellitus with hyperglycemia, without long-term current use of insulin (HCC)   Stroke (HCC)   CVA (cerebral vascular accident) (HCC)   Acute pulmonary embolism (HCC)   Pneumonia   Bacteremia   Pressure injury of skin   Severe sepsis: MRSA bacteremia: Patient presented with fever, leukocytosis on presentation, elevated lactic acid, altered mentation.   Source, MRSA bacteremia. Possibly from pressure ulcer. Started on empiric IV vancomycin. Infectious diseases consulted. Encephalopathy has improved.  MRSA nares +. -TTE and TEE were negative for endocarditis -Continue with vancomycin-patient will need until 08/29/2022 -PICC line ordered   Possible Pneumonia: Patient is not hypoxic.  CXR showed "Patchy left lung airspace disease concerning  for pneumonia." Started on azithro and Unasyn on admission Continue Unasyn and vancomycin.  Zithromax discontinued.   Hx of PE: This is a remote diagnosis, patient has also had CVA, and also has had critical limb ischemia, Patient has therefore been maintained on apixaban chronically.   Continue Eliquis   Hx of CVA (cerebral vascular accident) [facility]): This is a remote diagnosis, patient seems to be on Keppra chronically.  There is documentation of seizure disorder in the chart.   Continue Keppra -converting IV to p.o.  Type 2 diabetes mellitus: Hold metformin --reduce glargine to 5u nightly --ACHS and SSI   Hypokalemia: Resolved.  Continue to monitor  DVT prophylaxis: Eliquis Code Status: Full code Family Communication: Discussed with patient Disposition Plan:    Status is: Inpatient Remains inpatient appropriate because: Admitted for severe sepsis found to have MRSA bacteremia,  source of infection from the heel.  Infectious disease consulted,  recommended long-term antibiotics.     Consultants:  Infectious Diseases.  Cardiology  Procedures: TEE  Antimicrobials:  Anti-infectives (From admission, onward)    Start     Dose/Rate Route Frequency Ordered Stop   08/06/22 1000  vancomycin (VANCOCIN) IVPB 1000 mg/200 mL premix        1,000 mg 200 mL/hr over 60 Minutes Intravenous Every 12 hours 08/06/22 0416     08/03/22 2200  vancomycin (VANCOREADY) IVPB 750 mg/150 mL  Status:  Discontinued        750 mg 150 mL/hr over 60 Minutes Intravenous Every 12 hours 08/03/22 1109 08/06/22 0416   08/03/22 1030  vancomycin (VANCOCIN) IVPB 1000 mg/200 mL premix  1,000 mg 200 mL/hr over 60 Minutes Intravenous NOW 08/03/22 0936 08/03/22 1656   08/02/22 2300  Ampicillin-Sulbactam (UNASYN) 3 g in sodium chloride 0.9 % 100 mL IVPB        3 g 200 mL/hr over 30 Minutes Intravenous Every 6 hours 08/02/22 2204 08/07/22 2159   08/02/22 2200  azithromycin (ZITHROMAX) 500 mg in sodium  chloride 0.9 % 250 mL IVPB  Status:  Discontinued        500 mg 250 mL/hr over 60 Minutes Intravenous Every 24 hours 08/02/22 2153 08/04/22 1531   08/02/22 1845  vancomycin (VANCOREADY) IVPB 1750 mg/350 mL        1,750 mg 175 mL/hr over 120 Minutes Intravenous  Once 08/02/22 1831 08/02/22 2217   08/02/22 1830  ceFEPIme (MAXIPIME) 2 g in sodium chloride 0.9 % 100 mL IVPB        2 g 200 mL/hr over 30 Minutes Intravenous  Once 08/02/22 1823 08/02/22 2217   08/02/22 1830  vancomycin (VANCOCIN) IVPB 1000 mg/200 mL premix  Status:  Discontinued        1,000 mg 200 mL/hr over 60 Minutes Intravenous  Once 08/02/22 1823 08/02/22 1831       Subjective: Patient was seen after getting TEE, little lethargic due to anesthesia.  No new concern.  Wants to go home.   Objective: Vitals:   08/08/22 0830 08/08/22 0836 08/08/22 0915 08/08/22 1014  BP: 127/78  120/69 120/75  Pulse: 64 65 63 64  Resp: 19  16 17   Temp:   98.1 F (36.7 C) 98 F (36.7 C)  TempSrc:   Oral Oral  SpO2: 99% 98% 96% 99%  Weight:      Height:        Intake/Output Summary (Last 24 hours) at 08/08/2022 1436 Last data filed at 08/08/2022 1429 Gross per 24 hour  Intake 1026.92 ml  Output 1000 ml  Net 26.92 ml   Filed Weights   08/02/22 1653 08/03/22 0003 08/03/22 0944  Weight: 79 kg 60 kg 58.5 kg    Examination:  General.  Lethargic gentleman, in no acute distress. Pulmonary.  Lungs clear bilaterally, normal respiratory effort. CV.  Regular rate and rhythm,  Abdomen.  Soft, nontender, nondistended, BS positive. CNS.  Alert and oriented .  No focal neurologic deficit. Extremities.  No edema, right AKA  Data Reviewed: I have personally reviewed following labs and imaging studies  CBC: Recent Labs  Lab 08/02/22 1720 08/03/22 0458 08/04/22 0519 08/05/22 0516 08/06/22 0552 08/07/22 0446 08/08/22 0532  WBC 13.0*   < > 6.2 5.1 4.5 4.9 5.1  NEUTROABS 11.7*  --   --   --   --   --   --   HGB 12.6*   < > 9.8*  10.5* 9.9* 10.4* 10.0*  HCT 37.9*   < > 28.8* 30.7* 29.2* 30.8* 30.2*  MCV 88.1   < > 86.5 86.5 85.9 86.3 87.3  PLT 278   < > 203 233 200 222 212   < > = values in this interval not displayed.   Basic Metabolic Panel: Recent Labs  Lab 08/04/22 0519 08/05/22 0516 08/06/22 0552 08/07/22 0446 08/08/22 0532  NA 138 138 136 137 138  K 2.6* 3.6 3.2* 3.5 3.5  CL 103 103 102 103 102  CO2 28 28 28 29 30   GLUCOSE 76 153* 93 92 102*  BUN 8 9 6 9 8   CREATININE 0.37* 0.40* 0.42* 0.40* 0.45*  CALCIUM 8.2* 8.2*  8.3* 8.4* 8.2*  MG 1.8 2.0 2.0 2.1 1.9   GFR: Estimated Creatinine Clearance: 81.3 mL/min (A) (by C-G formula based on SCr of 0.45 mg/dL (L)). Liver Function Tests: Recent Labs  Lab 08/02/22 1720  AST 18  ALT 12  ALKPHOS 72  BILITOT 0.8  PROT 8.1  ALBUMIN 3.0*   No results for input(s): "LIPASE", "AMYLASE" in the last 168 hours. No results for input(s): "AMMONIA" in the last 168 hours. Coagulation Profile: Recent Labs  Lab 08/02/22 1747 08/03/22 0458  INR 1.3* 1.4*   Cardiac Enzymes: No results for input(s): "CKTOTAL", "CKMB", "CKMBINDEX", "TROPONINI" in the last 168 hours. BNP (last 3 results) No results for input(s): "PROBNP" in the last 8760 hours. HbA1C: No results for input(s): "HGBA1C" in the last 72 hours.  CBG: Recent Labs  Lab 08/07/22 1612 08/07/22 2201 08/08/22 0848 08/08/22 0925 08/08/22 1135  GLUCAP 135* 183* 64* 175* 118*   Lipid Profile: No results for input(s): "CHOL", "HDL", "LDLCALC", "TRIG", "CHOLHDL", "LDLDIRECT" in the last 72 hours. Thyroid Function Tests: No results for input(s): "TSH", "T4TOTAL", "FREET4", "T3FREE", "THYROIDAB" in the last 72 hours. Anemia Panel: No results for input(s): "VITAMINB12", "FOLATE", "FERRITIN", "TIBC", "IRON", "RETICCTPCT" in the last 72 hours. Sepsis Labs: Recent Labs  Lab 08/02/22 1720 08/02/22 1946 08/02/22 2339 08/03/22 0028 08/03/22 0456  PROCALCITON  --   --   --   --  <0.10  LATICACIDVEN  2.5* 2.3* 1.1 1.2  --     Recent Results (from the past 240 hour(s))  Culture, blood (Routine x 2)     Status: Abnormal   Collection Time: 08/02/22  5:20 PM   Specimen: BLOOD  Result Value Ref Range Status   Specimen Description   Final    BLOOD RIGHT ANTECUBITAL Performed at [facility], [address] [place], [place] [address]    Special Requests   Final    BOTTLES DRAWN AEROBIC AND ANAEROBIC Blood Culture adequate volume Performed at [facility], [address] [place], [place] [address]    Culture  Setup Time   Final    GRAM POSITIVE COCCI ANAEROBIC BOTTLE ONLY CRITICAL VALUE NOTED.  VALUE IS CONSISTENT WITH PREVIOUSLY REPORTED AND CALLED VALUE. ASW Performed at [facility], [address] [place], [place] [address]    Culture (A)  Final    STAPHYLOCOCCUS AUREUS SUSCEPTIBILITIES PERFORMED ON PREVIOUS CULTURE WITHIN THE LAST 5 DAYS. Performed at [facility], [address], [place], [place] [address]    Report Status 08/05/2022 FINAL  Final  Culture, blood (Routine x 2)     Status: Abnormal (Preliminary result)   Collection Time: 08/02/22  5:20 PM   Specimen: BLOOD  Result Value Ref Range Status   Specimen Description   Final    BLOOD LEFT ANTECUBITAL Performed at [facility], [address] [place], [place] [address]    Special Requests   Final    BOTTLES DRAWN AEROBIC AND ANAEROBIC Blood Culture adequate volume Performed at [facility], [address] [place], [place] [address]    Culture  Setup Time   Final    IN BOTH AEROBIC AND ANAEROBIC BOTTLES GRAM POSITIVE COCCI CRITICAL RESULT CALLED TO, READ BACK BY AND VERIFIED WITH: SHEEMA HALLAJI 08/03/22 @ 0843  BY SH    Culture (A)  Final    METHICILLIN RESISTANT STAPHYLOCOCCUS AUREUS Sent to [facility] for further susceptibility testing. Performed at [facility], [address], [place], [place]  [address]    Report Status PENDING  Incomplete    Organism ID, Bacteria METHICILLIN RESISTANT STAPHYLOCOCCUS AUREUS  Final      Susceptibility   Methicillin resistant staphylococcus aureus - MIC*    CIPROFLOXACIN <=0.5 SENSITIVE Sensitive     ERYTHROMYCIN >=8 RESISTANT Resistant     GENTAMICIN <=0.5 SENSITIVE Sensitive     OXACILLIN >=4 RESISTANT Resistant     TETRACYCLINE <=1 SENSITIVE Sensitive     VANCOMYCIN 1 SENSITIVE Sensitive     TRIMETH/SULFA <=10 SENSITIVE Sensitive     CLINDAMYCIN <=0.25 SENSITIVE Sensitive     RIFAMPIN <=0.5 SENSITIVE Sensitive     Inducible Clindamycin NEGATIVE Sensitive     LINEZOLID 2 SENSITIVE Sensitive     * METHICILLIN RESISTANT STAPHYLOCOCCUS AUREUS  Blood Culture ID Panel (Reflexed)     Status: Abnormal   Collection Time: 08/02/22  5:20 PM  Result Value Ref Range Status   Enterococcus faecalis NOT DETECTED NOT DETECTED Final   Enterococcus Faecium NOT DETECTED NOT DETECTED Final   Listeria monocytogenes NOT DETECTED NOT DETECTED Final   Staphylococcus species DETECTED (A) NOT DETECTED Final    Comment: CRITICAL RESULT CALLED TO, READ BACK BY AND VERIFIED WITH: SHEEMA HALLAJI 08/03/22 @ 0843 BY SH    Staphylococcus aureus (BCID) DETECTED (A) NOT DETECTED Final    Comment: Methicillin (oxacillin)-resistant Staphylococcus aureus (MRSA). MRSA is predictably resistant to beta-lactam antibiotics (except ceftaroline). Preferred therapy is vancomycin unless clinically contraindicated. Patient requires contact precautions if  hospitalized. CRITICAL RESULT CALLED TO, READ BACK BY AND VERIFIED WITH: SHEEMA HALLAJI 08/03/22 @ 0843 BY SH    Staphylococcus epidermidis NOT DETECTED NOT DETECTED Final   Staphylococcus lugdunensis NOT DETECTED NOT DETECTED Final   Streptococcus species NOT DETECTED NOT DETECTED Final   Streptococcus agalactiae NOT DETECTED NOT DETECTED Final   Streptococcus pneumoniae NOT DETECTED NOT DETECTED Final   Streptococcus pyogenes NOT DETECTED NOT DETECTED Final    A.calcoaceticus-baumannii NOT DETECTED NOT DETECTED Final   Bacteroides fragilis NOT DETECTED NOT DETECTED Final   Enterobacterales NOT DETECTED NOT DETECTED Final   Enterobacter cloacae complex NOT DETECTED NOT DETECTED Final   Escherichia coli NOT DETECTED NOT DETECTED Final   Klebsiella aerogenes NOT DETECTED NOT DETECTED Final   Klebsiella oxytoca NOT DETECTED NOT DETECTED Final   Klebsiella pneumoniae NOT DETECTED NOT DETECTED Final   Proteus species NOT DETECTED NOT DETECTED Final   Salmonella species NOT DETECTED NOT DETECTED Final   Serratia marcescens NOT DETECTED NOT DETECTED Final   Haemophilus influenzae NOT DETECTED NOT DETECTED Final   Neisseria meningitidis NOT DETECTED NOT DETECTED Final   Pseudomonas aeruginosa NOT DETECTED NOT DETECTED Final   Stenotrophomonas maltophilia NOT DETECTED NOT DETECTED Final   Candida albicans NOT DETECTED NOT DETECTED Final   Candida auris NOT DETECTED NOT DETECTED Final   Candida glabrata NOT DETECTED NOT DETECTED Final   Candida krusei NOT DETECTED NOT DETECTED Final   Candida parapsilosis NOT DETECTED NOT DETECTED Final   Candida tropicalis NOT DETECTED NOT DETECTED Final   Cryptococcus neoformans/gattii NOT DETECTED NOT DETECTED Final   Meth resistant mecA/C and MREJ DETECTED (A) NOT DETECTED Final    Comment: CRITICAL RESULT CALLED TO, READ BACK BY AND VERIFIED WITH: [facility] HALLAJI 08/03/22 @ 0843 BY [facility] Performed at [facility], [address] [place], [place] [address]   Respiratory (~20 pathogens) panel by PCR     Status: None   Collection Time: 08/02/22 11:51 PM   Specimen: Nasopharyngeal Swab; Respiratory  Result Value Ref Range Status   Adenovirus NOT DETECTED NOT DETECTED Final   Coronavirus 229E NOT DETECTED NOT DETECTED Final    Comment: (NOTE) The Coronavirus on the Respiratory Panel, DOES NOT test for the novel  Coronavirus (2019 nCoV)    Coronavirus HKU1 NOT DETECTED NOT DETECTED Final   Coronavirus NL63  NOT DETECTED NOT DETECTED Final   Coronavirus OC43 NOT DETECTED NOT DETECTED Final   Metapneumovirus NOT DETECTED NOT DETECTED Final   Rhinovirus / Enterovirus NOT DETECTED NOT DETECTED Final   Influenza A NOT DETECTED NOT DETECTED Final   Influenza B NOT DETECTED NOT DETECTED Final   Parainfluenza Virus 1 NOT DETECTED NOT DETECTED Final   Parainfluenza Virus 2 NOT DETECTED NOT DETECTED Final   Parainfluenza Virus 3 NOT DETECTED NOT DETECTED Final   Parainfluenza Virus 4 NOT DETECTED NOT DETECTED Final   Respiratory Syncytial Virus NOT DETECTED NOT DETECTED Final   Bordetella pertussis NOT DETECTED NOT DETECTED Final   Bordetella Parapertussis NOT DETECTED NOT DETECTED Final   Chlamydophila pneumoniae NOT DETECTED NOT DETECTED Final   Mycoplasma pneumoniae NOT DETECTED NOT DETECTED Final    Comment: Performed at [facility], [address], [place], [place] [address]  SARS Coronavirus 2 by RT PCR (hospital order, performed in [facility] hospital lab) *cepheid single result test*     Status: None   Collection Time: 08/02/22 11:51 PM  Result Value Ref Range Status   SARS Coronavirus 2 by RT PCR NEGATIVE NEGATIVE Final    Comment: (NOTE) SARS-CoV-2 target nucleic acids are NOT DETECTED.  The SARS-CoV-2 RNA is generally detectable in upper and lower respiratory specimens during the acute phase of infection. The lowest concentration of SARS-CoV-2 viral copies this assay can detect is 250 copies / mL. A negative result does not preclude SARS-CoV-2 infection and should not be used as the sole basis for treatment or other patient management decisions.  A negative result may occur with improper specimen collection / handling, submission of specimen other than nasopharyngeal swab, presence of viral mutation(s) within the areas targeted by this assay, and inadequate number of viral copies (<250 copies / mL). A negative result must be combined with clinical observations, patient history,  and epidemiological information.  Fact Sheet for Patients:   RoadLapTop.co.za  Fact Sheet for Healthcare Providers: http://kim-miller.com/  This test is not yet approved or  cleared by the [place] FDA and has been authorized for detection and/or diagnosis of SARS-CoV-2 by FDA under an Emergency Use Authorization (EUA).  This EUA will remain in effect (meaning this test can be used) for the duration of the COVID-19 declaration under Section 564(b)(1) of the Act, 21 U.S.C. section 360bbb-3(b)(1), unless the authorization is terminated or revoked sooner.  Performed at [facility], [address], [place], [place] [address]   Aerobic Culture w Gram Stain (superficial specimen)     Status: None   Collection Time: 08/03/22  8:28 PM   Specimen: Wound  Result Value Ref Range Status   Specimen Description   Final    WOUND Performed at [facility], [address] [place], [place] [address]    Special Requests   Final    LEFT FOOT Performed at [facility], [address] [place], [place] [address]    Gram Stain   Final    RARE WBC PRESENT, PREDOMINANTLY PMN FEW GRAM POSITIVE COCCI    Culture   Final    MODERATE STAPHYLOCOCCUS AUREUS RARE  PROTEUS MIRABILIS MODERATE CORYNEBACTERIUM STRIATUM Standardized susceptibility testing for this organism is not available. Performed at [facility], [address] [place], [place] [address]    Report Status 08/07/2022 FINAL  Final   Organism ID, Bacteria STAPHYLOCOCCUS AUREUS  Final   Organism ID, Bacteria PROTEUS MIRABILIS  Final      Susceptibility   Proteus mirabilis - MIC*    AMPICILLIN <=2 SENSITIVE Sensitive     CEFEPIME <=0.12 SENSITIVE Sensitive     CEFTAZIDIME <=1 SENSITIVE Sensitive     CEFTRIAXONE <=0.25 SENSITIVE Sensitive     CIPROFLOXACIN <=0.25 SENSITIVE Sensitive     GENTAMICIN <=1 SENSITIVE Sensitive     IMIPENEM 2 SENSITIVE  Sensitive     TRIMETH/SULFA >=320 RESISTANT Resistant     AMPICILLIN/SULBACTAM <=2 SENSITIVE Sensitive     PIP/TAZO <=4 SENSITIVE Sensitive     * RARE PROTEUS MIRABILIS   Staphylococcus aureus - MIC*    CIPROFLOXACIN <=0.5 SENSITIVE Sensitive     ERYTHROMYCIN >=8 RESISTANT Resistant     GENTAMICIN <=0.5 SENSITIVE Sensitive     OXACILLIN >=4 RESISTANT Resistant     TETRACYCLINE <=1 SENSITIVE Sensitive     VANCOMYCIN 1 SENSITIVE Sensitive     TRIMETH/SULFA <=10 SENSITIVE Sensitive     CLINDAMYCIN <=0.25 SENSITIVE Sensitive     RIFAMPIN <=0.5 SENSITIVE Sensitive     Inducible Clindamycin NEGATIVE Sensitive     LINEZOLID 2 SENSITIVE Sensitive     * MODERATE STAPHYLOCOCCUS AUREUS  MRSA Next Gen by PCR, Nasal     Status: Abnormal   Collection Time: 08/03/22 10:57 PM   Specimen: Nasal Mucosa; Nasal Swab  Result Value Ref Range Status   MRSA by PCR Next Gen DETECTED (A) NOT DETECTED Final    Comment: CRITICAL RESULT CALLED TO, READ BACK BY AND VERIFIED WITH: BILL WENDT RN @0011  08/04/22 ASW (NOTE) The GeneXpert MRSA Assay (FDA approved for NASAL specimens only), is one component of a comprehensive MRSA colonization surveillance program. It is not intended to diagnose MRSA infection nor to guide or monitor treatment for MRSA infections. Test performance is not FDA approved in patients less than 66 years old. Performed at [facility], [address] [place], [place] [address]   Culture, blood (Routine X 2) w Reflex to ID Panel     Status: None (Preliminary result)   Collection Time: 08/04/22  5:19 AM   Specimen: BLOOD  Result Value Ref Range Status   Specimen Description BLOOD BLOOD RIGHT ARM  Final   Special Requests   Final    BOTTLES DRAWN AEROBIC AND ANAEROBIC Blood Culture adequate volume   Culture   Final    NO GROWTH 4 DAYS Performed at [facility], [address] [place], [place] [address]    Report Status PENDING  Incomplete  Culture, blood (Routine  X 2) w Reflex to ID Panel     Status: None (Preliminary result)   Collection Time: 08/04/22  5:19 AM   Specimen: BLOOD  Result Value Ref Range Status   Specimen Description BLOOD BLOOD RIGHT HAND  Final   Special Requests   Final    BOTTLES DRAWN AEROBIC AND ANAEROBIC Blood Culture adequate volume   Culture   Final    NO GROWTH 4 DAYS Performed at [facility], [address], [place], [place] [address]    Report Status PENDING  Incomplete    Radiology Studies: [place] EKG SITE [facility]  Result Date: 08/08/2022 If Site [facility]  not attached, placement could not be confirmed due to current cardiac rhythm.  ECHO TEE  Result Date: 08/08/2022    TRANSESOPHOGEAL ECHO REPORT   Patient Name:   [name] Date of Exam: 08/08/2022 Medical Rec #:  [id number]        Height:       67.0 in Accession #:    [id number]       Weight:       129.0 lb Date of Birth:  1960/01/24        BSA:          1.678 m Patient Age:    62 years         BP:           122/67 mmHg Patient Gender: M                HR:           66 bpm. Exam Location:  [facility] Procedure: Transesophageal Echo, Color Doppler, Cardiac Doppler and Saline            Contrast Bubble Study Indications:     Bacteremia R78.81  History:         Patient has prior history of Echocardiogram examinations, most                  recent 08/05/2022. Stroke; Risk Factors:Hypertension and                  Diabetes.  Sonographer:     Cristela Blue Referring Phys:  [id number] SHERI HAMMOCK Diagnosing Phys: Debbe Odea MD PROCEDURE: The transesophogeal probe was passed without difficulty through the esophogus of the patient. Sedation performed by performing physician. The patient developed no complications during the procedure.  IMPRESSIONS  1. Left ventricular ejection fraction, by estimation, is 55 to 60%. The left ventricle has normal function.  2. Right ventricular systolic function is normal. The right ventricular size is normal.  3. No left atrial/left atrial  appendage thrombus was detected.  4. The mitral valve is normal in structure. Trivial mitral valve regurgitation.  5. The aortic valve is tricuspid. Aortic valve regurgitation is mild to moderate. Mild aortic valve stenosis. Aortic valve mean gradient measures 10.0 mmHg. Aortic valve Vmax measures 2.06 m/s.  6. Agitated saline contrast bubble study was negative, with no evidence of any interatrial shunt. Conclusion(s)/Recommendation(s): No evidence of vegetation/infective endocarditis on this transesophageael echocardiogram. FINDINGS  Left Ventricle: Left ventricular ejection fraction, by estimation, is 55 to 60%. The left ventricle has normal function. The left ventricular internal cavity size was normal in size. Right Ventricle: The right ventricular size is normal. No increase in right ventricular wall thickness. Right ventricular systolic function is normal. Left Atrium: Left atrial size was normal in size. No left atrial/left atrial appendage thrombus was detected. Right Atrium: Right atrial size was normal in size. Pericardium: There is no evidence of pericardial effusion. Mitral Valve: The mitral valve is normal in structure. Trivial mitral valve regurgitation. Tricuspid Valve: The tricuspid valve is normal in structure. Tricuspid valve regurgitation is mild. Aortic Valve: The aortic valve is tricuspid. Aortic valve regurgitation is mild to moderate. Mild aortic stenosis is present. Aortic valve mean gradient measures 10.0 mmHg. Aortic valve peak gradient measures 17.0 mmHg. Pulmonic Valve: The pulmonic valve was normal in structure. Pulmonic valve regurgitation is not visualized. Aorta: The aortic root is normal in size and structure. IAS/Shunts: No atrial level shunt detected by color flow Doppler. Agitated saline contrast  was given intravenously to evaluate for intracardiac shunting. Agitated saline contrast bubble study was negative, with no evidence of any interatrial shunt.  AORTIC VALVE AV Vmax:       206.00 cm/s AV Vmean:     145.000 cm/s AV VTI:       0.413 m AV Peak Grad: 17.0 mmHg AV Mean Grad: 10.0 mmHg Debbe Odea MD Electronically signed by Debbe Odea MD Signature Date/Time: 08/08/2022/10:34:07 AM    Final     Scheduled Meds:  amLODipine  10 mg Oral Daily   apixaban  5 mg Oral BID   aspirin EC  81 mg Oral Daily   atorvastatin  40 mg Oral q1800   bethanechol  10 mg Oral TID   Chlorhexidine Gluconate Cloth  6 each Topical Q0600   finasteride  5 mg Oral Daily   insulin aspart  0-15 Units Subcutaneous TID WC   insulin aspart  0-5 Units Subcutaneous QHS   insulin glargine-yfgn  5 Units Subcutaneous QHS   midazolam       sodium chloride flush  3 mL Intravenous Q12H   tamsulosin  0.4 mg Oral QPC supper   Continuous Infusions:  levETIRAcetam 500 mg (08/08/22 1225)   vancomycin 1,000 mg (08/08/22 1045)     LOS: 6 days    Time spent: 38 mins.  This record has been created using Conservation officer, historic buildings. Errors have been sought and corrected,but may not always be located. Such creation errors do not reflect on the standard of care.   Arnetha Courser, MD [facility]   If 7PM-7AM, please contact night-coverage

## 2022-08-08 NOTE — Progress Notes (Signed)
Patient one hour s/p TEE, NPO, CBG is 64. MD aware.

## 2022-08-08 NOTE — Progress Notes (Signed)
Spoke with primary RN regarding PICC line order. Made RN aware the PICC would not be placed today. Pt not discharging until possibly tomorrow, 08/09/22. Currently has a functioning PIV at this time.

## 2022-08-08 NOTE — Progress Notes (Addendum)
Pharmacy Antibiotic Note  [name] is a 62 y.o. male admitted on 08/02/2022 with bacteremia.  Pharmacy has been consulted for daptomycin dosing. Patient noted to have MRSA bacteremia 7/11.  Source unclear at this time but noted to have sacral and L heel wound.  He has PMH PAD with R AKA.   08/08/2022 Day #6 Vancomycin, s/p 5 days ampicillin/sulbactam Renal: SCr 0.45 - stable WBC WNL Afebrile   Previous vancomycin levels 7/13 2142 Vancomycin level (trough) = 8 Increased Vancomycin dose to 1000 mg 12 hr Recheck Vancomycin levels as appropriate.    Plan: Changing vancomycin to daptomycin for lower risk of nephrotoxicity and easier use at home (q24h vs q12h for vancomycin), start daptomycin 450mg  (8mg /kg) IV q24h CK with labs 7/17 then weekly On atorvastatin 40mg   Monitor renal function Awaiting final daptomycin MIC result that was sent to [facility] ID consulted - plan 4 weeks of IV antibiotics. Orders for PICC placement  Height: 5\' 7"  (170.2 cm) Weight: 58.5 kg (128 lb 15.5 oz) IBW/kg (Calculated) : 66.1  Temp (24hrs), Avg:98.2 F (36.8 C), Min:98 F (36.7 C), Max:98.4 F (36.9 C)  Recent Labs  Lab 08/02/22 1720 08/02/22 1946 08/02/22 2339 08/03/22 0028 08/03/22 0458 08/04/22 0519 08/05/22 0516 08/05/22 2142 08/06/22 0552 08/07/22 0446 08/08/22 0532  WBC 13.0*  --   --   --    < > 6.2 5.1  --  4.5 4.9 5.1  CREATININE 0.53*  --   --   --    < > 0.37* 0.40*  --  0.42* 0.40* 0.45*  LATICACIDVEN 2.5* 2.3* 1.1 1.2  --   --   --   --   --   --   --   VANCORANDOM  --   --   --   --   --   --   --  8  --   --   --    < > = values in this interval not displayed.    Estimated Creatinine Clearance: 81.3 mL/min (A) (by C-G formula based on SCr of 0.45 mg/dL (L)).    No Known Allergies  Antimicrobials this admission: 7/16 daptomycin >> 7/10 vanc >>7/16 7/10 cefepime x 1 7/10 amp/sulb >> (7/15) 7/10 azith >> (7/12)  Dose adjustments this admission:  Microbiology  results: 7/10 BCx: GPC, BCID MRSA   Thank you for allowing pharmacy to be a part of this patient's care.  Juliette Alcide, PharmD, BCPS, BCIDP Work Cell: [phone] 08/08/2022 4:44 PM

## 2022-08-09 ENCOUNTER — Encounter: Payer: Self-pay | Admitting: Cardiology

## 2022-08-09 DIAGNOSIS — S91302A Unspecified open wound, left foot, initial encounter: Secondary | ICD-10-CM

## 2022-08-09 DIAGNOSIS — I2699 Other pulmonary embolism without acute cor pulmonale: Secondary | ICD-10-CM | POA: Diagnosis not present

## 2022-08-09 DIAGNOSIS — J189 Pneumonia, unspecified organism: Secondary | ICD-10-CM | POA: Diagnosis not present

## 2022-08-09 DIAGNOSIS — I639 Cerebral infarction, unspecified: Secondary | ICD-10-CM | POA: Diagnosis not present

## 2022-08-09 DIAGNOSIS — R7881 Bacteremia: Secondary | ICD-10-CM | POA: Diagnosis not present

## 2022-08-09 DIAGNOSIS — B9562 Methicillin resistant Staphylococcus aureus infection as the cause of diseases classified elsewhere: Secondary | ICD-10-CM | POA: Diagnosis not present

## 2022-08-09 DIAGNOSIS — E1151 Type 2 diabetes mellitus with diabetic peripheral angiopathy without gangrene: Secondary | ICD-10-CM | POA: Diagnosis not present

## 2022-08-09 LAB — GLUCOSE, CAPILLARY
Glucose-Capillary: 70 mg/dL (ref 70–99)
Glucose-Capillary: 120 mg/dL — ABNORMAL HIGH (ref 70–99)
Glucose-Capillary: 125 mg/dL — ABNORMAL HIGH (ref 70–99)
Glucose-Capillary: 76 mg/dL (ref 70–99)

## 2022-08-09 LAB — CULTURE, BLOOD (ROUTINE X 2): Culture: NO GROWTH

## 2022-08-09 LAB — CK: Total CK: 29 U/L — ABNORMAL LOW (ref 49–397)

## 2022-08-09 LAB — CREATININE, SERUM
Creatinine, Ser: 0.47 mg/dL — ABNORMAL LOW (ref 0.61–1.24)
GFR, Estimated: >60 mL/min (ref >60)

## 2022-08-09 LAB — C-REACTIVE PROTEIN: CRP: 1 mg/dL — ABNORMAL HIGH (ref <1.0)

## 2022-08-09 LAB — SEDIMENTATION RATE: Sed Rate: 66 mm/hr — ABNORMAL HIGH (ref 0–20)

## 2022-08-09 MED ORDER — SODIUM CHLORIDE 0.9% FLUSH: 10.0000 mL | INTRAVENOUS | Status: DC | PRN

## 2022-08-09 MED ORDER — SODIUM CHLORIDE 0.9 % IV SOLN
500.0000 mg | Freq: Every day | INTRAVENOUS | Status: DC
  Administered 2022-08-09: 500 mg via INTRAVENOUS
  Filled 2022-08-09: qty 10

## 2022-08-09 MED ORDER — DAPTOMYCIN IV (FOR PTA / DISCHARGE USE ONLY): 500.0000 mg | INTRAVENOUS | 0 refills | Status: DC

## 2022-08-09 MED ORDER — SODIUM CHLORIDE 0.9% FLUSH: 10.0000 mL | Freq: Two times a day (BID) | INTRAVENOUS | Status: DC

## 2022-08-09 MED ORDER — CHLORHEXIDINE GLUCONATE CLOTH 2 % EX PADS: 6.0000 | MEDICATED_PAD | Freq: Every day | CUTANEOUS | Status: DC

## 2022-08-09 NOTE — Treatment Plan (Signed)
Diagnosis: MRSA bacteremia Left heel ulcer Baseline Creatinine 0.47    No Known Allergies  OPAT Orders Discharge antibiotics: Daptomycin 500mg  IV every 24 hrs Duration: 4 weeks End Date: 08/31/22  [facility] Care Per Protocol:  Labs weekly while on IV antibiotics: _X_ CBC with differential _X_CK _X_ CMP _X_ CRP _X_ ESR   _X_ Please pull PIC at completion of IV antibiotics _  Fax weekly lab results  promptly to [phone]  Clinic Follow Up Appt:with Dr.Rinnah Peppel 08/24/22 at 10.45AM ( video visit)   Call [phone] with any questions

## 2022-08-09 NOTE — Progress Notes (Signed)
Peripherally Inserted Central Catheter Placement  The IV Nurse has discussed with the patient and/or persons authorized to consent for the patient, the purpose of this procedure and the potential benefits and risks involved with this procedure.  The benefits include less needle sticks, lab draws from the catheter, and the patient may be discharged home with the catheter. Risks include, but not limited to, infection, bleeding, blood clot (thrombus formation), and puncture of an artery; nerve damage and irregular heartbeat and possibility to perform a PICC exchange if needed/ordered by physician.  Alternatives to this procedure were also discussed.  Bard Power PICC patient education guide, fact sheet on infection prevention and patient information card has been provided to patient /or left at bedside. [name], sister consented to PICC placement via telephone with primary RN, Blanchard Mane, RN. Pt noted to have a large cyst on outer lateral aspect of upper bicep. Intact.  PICC Placement Documentation  PICC Single Lumen 08/09/22 Left Basilic 43 cm 0 cm (Active)  Indication for Insertion or Continuance of Line Prolonged intravenous therapies 08/09/22 1600  Exposed Catheter (cm) 0 cm 08/09/22 1600  Site Assessment Clean, Dry, Intact 08/09/22 1600  Line Status Flushed;Saline locked;Blood return noted 08/09/22 1600  Dressing Type Transparent;Securing device 08/09/22 1600  Dressing Status Antimicrobial disc in place;Clean, Dry, Intact 08/09/22 1600  Safety Lock Not Applicable 08/09/22 1600  Line Care Connections checked and tightened 08/09/22 1600  Line Adjustment (NICU/IV Team Only) No 08/09/22 1600  Dressing Intervention New dressing 08/09/22 1600  Dressing Change Due 08/16/22 08/09/22 1600       Vernona Rieger  Keshawna Dix 08/09/2022, 4:26 PM

## 2022-08-09 NOTE — Plan of Care (Signed)

## 2022-08-09 NOTE — Progress Notes (Signed)
Pt discharged off unit via EMS to home, pt stable, PICC in place and intact. [name] (pt sister) notified of pt leaving and verified that someone will be home to receive pt.

## 2022-08-09 NOTE — Progress Notes (Addendum)
Date of Admission:  08/02/2022     ID: [name] is a 62 y.o. male  Principal Problem:   MRSA bacteremia Active Problems:   Uncontrolled type 2 diabetes mellitus with hyperglycemia, without long-term current use of insulin (HCC)   Stroke (HCC)   CVA (cerebral vascular accident) (HCC)   Acute pulmonary embolism (HCC)   Pneumonia   Bacteremia   Pressure injury of skin    Subjective: Patient has no new complaints  Medications:   amLODipine  10 mg Oral Daily   apixaban  5 mg Oral BID   aspirin EC  81 mg Oral Daily   atorvastatin  40 mg Oral q1800   bethanechol  10 mg Oral TID   Chlorhexidine Gluconate Cloth  6 each Topical Daily   finasteride  5 mg Oral Daily   insulin aspart  0-15 Units Subcutaneous TID WC   insulin aspart  0-5 Units Subcutaneous QHS   insulin glargine-yfgn  5 Units Subcutaneous QHS   levETIRAcetam  500 mg Oral BID   sodium chloride flush  10-40 mL Intracatheter Q12H   sodium chloride flush  3 mL Intravenous Q12H   tamsulosin  0.4 mg Oral QPC supper    Objective: Vital signs in last 24 hours: Patient Vitals for the past 24 hrs:  BP Temp Temp src Pulse Resp SpO2  08/09/22 1941 129/79 98.5 F (36.9 C) Oral 74 16 99 %  08/09/22 0841 124/65 98.3 F (36.8 C) -- 68 18 100 %  08/09/22 0417 122/73 98.3 F (36.8 C) -- 69 20 100 %  08/09/22 0417 122/73 98.3 F (36.8 C) Oral 70 20 100 %      PHYSICAL EXAM:  General: Awake cooperative, no distress, oriented in person, place Lungs: b/l air entry  No Wheezing or Rhonchi. few Heart: Regular rate and rhythm, no murmur, rub or gallop. Abdomen: Soft, non-tender,not distended. Bowel sounds normal. No masses Extremities: atraumatic, no cyanosis. No edema. No clubbing Skin: Right heel wound superficial covered with eschar  Sacral stage II Lymph: Cervical, supraclavicular normal. Neurologic: rt hemiparesis  Lab Results    Latest Ref Rng & Units 08/08/2022    5:32 AM 08/07/2022    4:46 AM 08/06/2022     5:52 AM  CBC  WBC 4.0 - 10.5 K/uL 5.1  4.9  4.5   Hemoglobin 13.0 - 17.0 g/dL 16.1  09.6  9.9   Hematocrit 39.0 - 52.0 % 30.2  30.8  29.2   Platelets 150 - 400 K/uL 212  222  200        Latest Ref Rng & Units 08/09/2022    5:08 AM 08/08/2022    5:32 AM 08/07/2022    4:46 AM  CMP  Glucose 70 - 99 mg/dL  045  92   BUN 6 - 20 mg/dL  8  9   Creatinine 4.09 - 1.24 mg/dL 8.11  9.14  7.82   Sodium 135 - 145 mmol/L  138  137   Potassium 3.5 - 5.1 mmol/L  3.5  3.5   Chloride 98 - 111 mmol/L  102  103   CO2 22 - 32 mmol/L  30  29   Calcium 8.9 - 10.3 mg/dL  8.2  8.4       Microbiology: 7/10 BC- MRSA 08/04/22 BC NG so far Wound culture MRSA   Impression/Recommendation ? Encephalopathy Much improved    MRSA bacteremia Source  left heel wound. repeat blood culture negative so far TEE did  not show endocarditis Patient will be discharged on IV daptomycin for 4 weeks we will follow him as outpatient   Left lower lobe infiltrate MRSA nares swab+ Was treated with vancomycin earlier    History of right AKA   History of PAD in June he underwent angioplasty and stent placement in the left SFA, tibioperoneal trunk and posterior tibial.   Diabetes   History of CVA with right hemiparesis   Discussed the management with the patient and care team

## 2022-08-09 NOTE — Discharge Summary (Signed)
Physician Discharge Summary   Patient: [name] MRN: [id number] DOB: 1960/10/29  Admit date:     08/02/2022  Discharge date: 08/09/22  Discharge Physician: Arnetha Courser   PCP: Marya Fossa, PA-C   Recommendations at discharge:  Please obtain CBC, CMP and CK levels weekly. Patient can hold statin if needed Patient is being discharged on daptomycin until 8/6, please ensure the completion of course. Follow-up with primary care provider Follow-up with infectious disease  Discharge Diagnoses: Principal Problem:   MRSA bacteremia Active Problems:   Uncontrolled type 2 diabetes mellitus with hyperglycemia, without long-term current use of insulin (HCC)   Stroke (HCC)   CVA (cerebral vascular accident) (HCC)   Acute pulmonary embolism (HCC)   Pneumonia   Bacteremia   Pressure injury of skin  Resolved Problems:   * No resolved hospital problems. [facility] Course: This 62 yrs old male with medical history significant of prior/remote stroke, right hemiparesis.  Patient also has a peripheral arterial disease, remote catheterization of the right lower extremity, patient has also had an amputation of the right lower extremity.  Patient was seen by physical therapy at home and was noted to be markedly lethargic/somnolent.  EMS was called who subsequently found the patient to be febrile.  Patient is admitted for severe sepsis found to have MRSA bacteremia,  ID is consulted.   Patient underwent TTE and TEE and both were negative for any concern of endocarditis.  Patient also has pressure ulcers which can be a potential source of infection.  Patient initially received IV vancomycin and being discharged on IV daptomycin until 07/01/2022 as advised by our infectious disease specialist.  Patient will need a monitoring of CK level as ordered.  Patient was also found to have some patchy left airspace infiltrate concerning for pneumonia, initially received azithromycin mycin and Unasyn and  later azithromycin was discontinued.  Completed a course of Unasyn.  Patient has an history of remote diagnosis of PE.  Also has an history of stroke and critical limb ischemia s/p right AKA, he will continue his on home Eliquis.  Patient will continue home Keppra for seizure disorder.  Electrolyte were monitored and corrected during hospitalization.  Patient will continue on current medications and need to have a close follow-up with his providers for further recommendations.      Consultants: Infectious disease.  Cardiology Procedures performed: TEE Disposition: Home health Diet recommendation:  Discharge Diet Orders (From admission, onward)     Start     Ordered   08/09/22 0000  Diet - low sodium heart healthy        08/09/22 1541           Cardiac and Carb modified diet DISCHARGE MEDICATION: Allergies as of 08/09/2022   No Known Allergies      Medication List     STOP taking these medications    naloxone 4 MG/0.1ML Liqd nasal spray kit Commonly known as: NARCAN       TAKE these medications    acetaminophen 325 MG tablet Commonly known as: TYLENOL Take 650 mg by mouth every 6 (six) hours as needed.   amLODipine 10 MG tablet Commonly known as: NORVASC Take 1 tablet (10 mg total) by mouth daily.   apixaban 5 MG Tabs tablet Commonly known as: ELIQUIS Take 1 tablet (5 mg total) by mouth 2 (two) times daily.   ascorbic acid 500 MG tablet Commonly known as: VITAMIN C Take 500 mg by mouth 2 (two) times daily.  aspirin EC 81 MG tablet Take 1 tablet (81 mg total) by mouth daily. Swallow whole.   atorvastatin 40 MG tablet Commonly known as: LIPITOR Take 1 tablet (40 mg total) by mouth daily at 6 PM.   bethanechol 10 MG tablet Commonly known as: URECHOLINE Take 1 tablet (10 mg total) by mouth 3 (three) times daily.   daptomycin IVPB Commonly known as: CUBICIN Inject 500 mg into the vein daily for 21 days. Indication:  MRSA bacteremia First Dose:  Yes Last Day of Therapy:  08/31/2022 Labs - Once weekly:  CBC/D, BMP, CPK, ESR and CRP Fax weekly lab results  promptly to [phone] Method of administration: IV Push Please pull PIC at completion of IV antibiotics Method of administration may be changed at the discretion of home infusion pharmacist based upon assessment of the patient and/or caregiver's ability to self-administer the medication ordered. Antibiotic to be delivered to home by home infusion company Start taking on: August 10, 2022   ferrous sulfate 325 (65 FE) MG EC tablet Take 325 mg by mouth every other day.   finasteride 5 MG tablet Commonly known as: PROSCAR Take 5 mg by mouth daily.   insulin glargine-yfgn 100 UNIT/ML injection Commonly known as: SEMGLEE Inject 0.1 mLs (10 Units total) into the skin at bedtime.   levETIRAcetam 500 MG tablet Commonly known as: KEPPRA Take 1 tablet (500 mg total) by mouth 2 (two) times daily.   Magnesium 200 MG Tabs Take 200 mg by mouth daily.   melatonin 5 MG Tabs Take 1 tablet (5 mg total) by mouth at bedtime.   metFORMIN 500 MG tablet Commonly known as: GLUCOPHAGE Take 1 tablet (500 mg total) by mouth 2 (two) times daily with a meal.   miconazole 2 % cream Commonly known as: MICOTIN Apply topically 2 (two) times daily. Apply to toes.   senna 8.6 MG Tabs tablet Commonly known as: SENOKOT Take 1 tablet (8.6 mg total) by mouth 2 (two) times daily.   tamsulosin 0.4 MG Caps capsule Commonly known as: FLOMAX Take 1 capsule (0.4 mg total) by mouth daily after supper.   Vitamin D (Ergocalciferol) 1.25 MG (50000 UNIT) Caps capsule Commonly known as: DRISDOL Take 50,000 Units by mouth every 7 (seven) days. On Wednesday               Discharge Care Instructions  (From admission, onward)           Start     Ordered   08/09/22 0000  Change dressing on IV access line weekly and PRN  (Home infusion instructions - [facility] )        08/09/22 1541    08/09/22 0000  Discharge wound care:       Comments: Wound care to sacral stage 3 and left heel wound (full thickness):  Cleanse with NS, pat dry. Cover with xeroform gauze [place] # [id number]), top with dry gauze and secure with silicone foam. Change xeroform daily, may reused silicone foam for up to 3 days. Change PRN soiling or rolling of dressing edges.   08/09/22 1541            Follow-up Information     Marya Fossa, PA-C. Schedule an appointment as soon as possible for a visit in 1 week(s).   Specialty: Physician Assistant Contact information: [address] [place] [address] [phone]         Lynn Ito, MD. Schedule an appointment as soon as possible for  a visit.   Specialty: Infectious Diseases Contact information: [address] [place] [address] [phone]                Discharge Exam: Filed Weights   08/02/22 1653 08/03/22 0003 08/03/22 0944  Weight: 79 kg 60 kg 58.5 kg   General.     In no acute distress. Pulmonary.  Lungs clear bilaterally, normal respiratory effort. CV.  Regular rate and rhythm,  Abdomen.  Soft, nontender, nondistended, BS positive. CNS.  Alert and oriented .  No focal neurologic deficit. Extremities.  No edema, right AKA  Condition at discharge: stable  The results of significant diagnostics from this hospitalization (including imaging, microbiology, ancillary and laboratory) are listed below for reference.   Imaging Studies: [place] EKG SITE RITE  Result Date: 08/08/2022 If Site Rite image not attached, placement could not be confirmed due to current cardiac rhythm.  ECHO TEE  Result Date: 08/08/2022    TRANSESOPHOGEAL ECHO REPORT   Patient Name:   ARAGON SCARANTINO Date of Exam: 08/08/2022 Medical Rec #:  [id number]        Height:       67.0 in Accession #:    [id number]       Weight:       129.0 lb Date of Birth:  06/15/1960        BSA:          1.678 m Patient Age:    62 years         BP:            122/67 mmHg Patient Gender: M                HR:           66 bpm. Exam Location:  [facility] Procedure: Transesophageal Echo, Color Doppler, Cardiac Doppler and Saline            Contrast Bubble Study Indications:     Bacteremia R78.81  History:         Patient has prior history of Echocardiogram examinations, most                  recent 08/05/2022. Stroke; Risk Factors:Hypertension and                  Diabetes.  Sonographer:     Cristela Blue Referring Phys:  [id number] SHERI HAMMOCK Diagnosing Phys: Debbe Odea MD PROCEDURE: The transesophogeal probe was passed without difficulty through the esophogus of the patient. Sedation performed by performing physician. The patient developed no complications during the procedure.  IMPRESSIONS  1. Left ventricular ejection fraction, by estimation, is 55 to 60%. The left ventricle has normal function.  2. Right ventricular systolic function is normal. The right ventricular size is normal.  3. No left atrial/left atrial appendage thrombus was detected.  4. The mitral valve is normal in structure. Trivial mitral valve regurgitation.  5. The aortic valve is tricuspid. Aortic valve regurgitation is mild to moderate. Mild aortic valve stenosis. Aortic valve mean gradient measures 10.0 mmHg. Aortic valve Vmax measures 2.06 m/s.  6. Agitated saline contrast bubble study was negative, with no evidence of any interatrial shunt. Conclusion(s)/Recommendation(s): No evidence of vegetation/infective endocarditis on this transesophageael echocardiogram. FINDINGS  Left Ventricle: Left ventricular ejection fraction, by estimation, is 55 to 60%. The left ventricle has normal function. The left ventricular internal cavity size was normal in size. Right Ventricle: The right ventricular size is normal. No increase  in right ventricular wall thickness. Right ventricular systolic function is normal. Left Atrium: Left atrial size was normal in size. No left atrial/left atrial appendage thrombus  was detected. Right Atrium: Right atrial size was normal in size. Pericardium: There is no evidence of pericardial effusion. Mitral Valve: The mitral valve is normal in structure. Trivial mitral valve regurgitation. Tricuspid Valve: The tricuspid valve is normal in structure. Tricuspid valve regurgitation is mild. Aortic Valve: The aortic valve is tricuspid. Aortic valve regurgitation is mild to moderate. Mild aortic stenosis is present. Aortic valve mean gradient measures 10.0 mmHg. Aortic valve peak gradient measures 17.0 mmHg. Pulmonic Valve: The pulmonic valve was normal in structure. Pulmonic valve regurgitation is not visualized. Aorta: The aortic root is normal in size and structure. IAS/Shunts: No atrial level shunt detected by color flow Doppler. Agitated saline contrast was given intravenously to evaluate for intracardiac shunting. Agitated saline contrast bubble study was negative, with no evidence of any interatrial shunt.  AORTIC VALVE AV Vmax:      206.00 cm/s AV Vmean:     145.000 cm/s AV VTI:       0.413 m AV Peak Grad: 17.0 mmHg AV Mean Grad: 10.0 mmHg Debbe Odea MD Electronically signed by Debbe Odea MD Signature Date/Time: 08/08/2022/10:34:07 AM    Final    ECHOCARDIOGRAM COMPLETE  Result Date: 08/05/2022    ECHOCARDIOGRAM REPORT   Patient Name:   DERRION TRITZ Date of Exam: 08/05/2022 Medical Rec #:  [id number]        Height:       67.0 in Accession #:    [id number]       Weight:       129.0 lb Date of Birth:  [birth year]        BSA:          1.678 m Patient Age:    62 years         BP:           135/72 mmHg Patient Gender: M                HR:           73 bpm. Exam Location:  [facility] Procedure: 2D Echo Indications:     Bacteremia R78.81  History:         Patient has prior history of Echocardiogram examinations, most                  recent 04/26/2021.  Sonographer:     Overton Mam RDCS, FASE Referring Phys:  [id number] Lynn Ito Diagnosing Phys: Chilton Si MD  IMPRESSIONS  1. Left ventricular ejection fraction, by estimation, is 55 to 60%. The left ventricle has normal function. The left ventricle has no regional wall motion abnormalities. There is mild concentric left ventricular hypertrophy. Left ventricular diastolic parameters are consistent with Grade I diastolic dysfunction (impaired relaxation).  2. Right ventricular systolic function is normal. The right ventricular size is normal.  3. The mitral valve is normal in structure. Mild mitral valve regurgitation. No evidence of mitral stenosis.  4. The aortic valve is tricuspid. There is moderate calcification of the aortic valve. There is moderate thickening of the aortic valve. Aortic valve regurgitation is mild to moderate. Mild aortic valve stenosis. Aortic regurgitation PHT measures 415 msec. Aortic valve area, by VTI measures 2.11 cm. Aortic valve mean gradient measures 11.0 mmHg. Aortic valve Vmax measures 2.22 m/s. FINDINGS  Left Ventricle: Left ventricular ejection fraction, by estimation, is  55 to 60%. The left ventricle has normal function. The left ventricle has no regional wall motion abnormalities. The left ventricular internal cavity size was normal in size. There is  mild concentric left ventricular hypertrophy. Left ventricular diastolic parameters are consistent with Grade I diastolic dysfunction (impaired relaxation). Normal left ventricular filling pressure. Right Ventricle: The right ventricular size is normal. No increase in right ventricular wall thickness. Right ventricular systolic function is normal. Left Atrium: Left atrial size was normal in size. Right Atrium: Right atrial size was normal in size. Pericardium: There is no evidence of pericardial effusion. Mitral Valve: The mitral valve is normal in structure. Mild mitral valve regurgitation. No evidence of mitral valve stenosis. Tricuspid Valve: The tricuspid valve is normal in structure. Tricuspid valve regurgitation is trivial. No  evidence of tricuspid stenosis. Aortic Valve: The aortic valve is tricuspid. There is moderate calcification of the aortic valve. There is moderate thickening of the aortic valve. Aortic valve regurgitation is mild to moderate. Aortic regurgitation PHT measures 415 msec. Mild aortic stenosis is present. Aortic valve mean gradient measures 11.0 mmHg. Aortic valve peak gradient measures 19.7 mmHg. Aortic valve area, by VTI measures 2.11 cm. Pulmonic Valve: The pulmonic valve was normal in structure. Pulmonic valve regurgitation is not visualized. No evidence of pulmonic stenosis. Aorta: The aortic root is normal in size and structure. Venous: The inferior vena cava was not well visualized. IAS/Shunts: No atrial level shunt detected by color flow Doppler.  LEFT VENTRICLE PLAX 2D LVIDd:         4.40 cm   Diastology LVIDs:         3.10 cm   LV e' medial:    10.10 cm/s LV PW:         1.10 cm   LV E/e' medial:  7.8 LV IVS:        1.20 cm   LV e' lateral:   12.90 cm/s LVOT diam:     2.10 cm   LV E/e' lateral: 6.1 LV SV:         86 LV SV Index:   51 LVOT Area:     3.46 cm  RIGHT VENTRICLE RV Basal diam:  2.00 cm RV S prime:     15.70 cm/s TAPSE (M-mode): 2.2 cm LEFT ATRIUM             Index        RIGHT ATRIUM          Index LA diam:        3.00 cm 1.79 cm/m   RA Area:     9.99 cm LA Vol (A2C):   48.9 ml 29.14 ml/m  RA Volume:   20.10 ml 11.98 ml/m LA Vol (A4C):   29.7 ml 17.70 ml/m LA Biplane Vol: 37.8 ml 22.52 ml/m  AORTIC VALVE                     PULMONIC VALVE AV Area (Vmax):    1.89 cm      PV Vmax:       1.17 m/s AV Area (Vmean):   1.76 cm      PV Peak grad:  5.5 mmHg AV Area (VTI):     2.11 cm AV Vmax:           222.00 cm/s AV Vmean:          153.000 cm/s AV VTI:            0.409 m  AV Peak Grad:      19.7 mmHg AV Mean Grad:      11.0 mmHg LVOT Vmax:         121.00 cm/s LVOT Vmean:        77.800 cm/s LVOT VTI:          0.249 m LVOT/AV VTI ratio: 0.61 AI PHT:            415 msec  AORTA Ao Root diam: 3.50 cm  Ao Asc diam:  3.50 cm MITRAL VALVE               TRICUSPID VALVE MV Area (PHT): 3.16 cm    TR Peak grad:   27.0 mmHg MV Decel Time: 240 msec    TR Vmax:        260.00 cm/s MV E velocity: 79.10 cm/s MV A velocity: 91.20 cm/s  SHUNTS MV E/A ratio:  0.87        Systemic VTI:  0.25 m                            Systemic Diam: 2.10 cm Chilton Si MD Electronically signed by Chilton Si MD Signature Date/Time: 08/05/2022/2:57:06 PM    Final    DG Foot Complete Left  Result Date: 08/02/2022 CLINICAL DATA:  Skin wound in the heel EXAM: LEFT FOOT - COMPLETE 3+ VIEW COMPARISON:  None Available. FINDINGS: No displaced fracture or dislocation is seen. Osteopenia is seen involving structures. There are no focal lytic lesions. Arterial calcifications are seen in soft tissues. IMPRESSION: No fracture or dislocation is seen. Osteopenia. There are no focal lytic lesions. If there is clinical suspicion for osteomyelitis, follow-up MRI may be considered. 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 Electronically Signed   By: Ernie Avena M.D.   On: 08/02/2022 17:53   DG Chest 2 View  Result Date: 08/02/2022 CLINICAL DATA:  Fever, altered mental status EXAM: CHEST - 2 VIEW COMPARISON:  12/30/2021 FINDINGS: Low lung volumes. Patchy airspace disease in the left mid and lower lung. Right lung clear. No effusions. Heart mediastinal contours within normal limits. No acute bony abnormality. IMPRESSION: Patchy left lung airspace disease concerning for pneumonia. Low lung volumes. Electronically Signed   By: Charlett Nose M.D.   On: 08/02/2022 17:52    Microbiology: Results for orders placed or performed during the hospital encounter of 08/02/22  Culture, blood (Routine x 2)     Status: Abnormal   Collection Time: 08/02/22  5:20 PM   Specimen: BLOOD  Result Value Ref Range Status   Specimen Description   Final    BLOOD RIGHT ANTECUBITAL Performed at [facility], [address], [place], [place] [address]    Special Requests   Final    BOTTLES DRAWN AEROBIC AND ANAEROBIC Blood Culture adequate volume Performed at [facility], [address] [place], [place] [address]    Culture  Setup Time   Final    GRAM POSITIVE COCCI ANAEROBIC BOTTLE ONLY CRITICAL VALUE NOTED.  VALUE IS CONSISTENT WITH PREVIOUSLY REPORTED AND CALLED VALUE. ASW Performed at [facility], [address] [place], [place] [address]    Culture (A)  Final    STAPHYLOCOCCUS AUREUS SUSCEPTIBILITIES PERFORMED ON PREVIOUS CULTURE WITHIN THE LAST 5 DAYS. Performed at [facility], [address], [place], [place] [address]    Report Status 08/05/2022 FINAL  Final  Culture, blood (Routine  x 2)     Status: Abnormal (Preliminary result)   Collection Time: 08/02/22  5:20 PM   Specimen: BLOOD  Result Value Ref Range Status   Specimen Description   Final    BLOOD LEFT ANTECUBITAL Performed at [facility], [address] [place], [place] [address]    Special Requests   Final    BOTTLES DRAWN AEROBIC AND ANAEROBIC Blood Culture adequate volume Performed at [facility], [address] [place], [place] [address]    Culture  Setup Time   Final    IN BOTH AEROBIC AND ANAEROBIC BOTTLES GRAM POSITIVE COCCI CRITICAL RESULT CALLED TO, READ BACK BY AND VERIFIED WITH: SHEEMA HALLAJI 08/03/22 @ 0843  BY SH    Culture (A)  Final    METHICILLIN RESISTANT STAPHYLOCOCCUS AUREUS Sent to [facility] for further susceptibility testing. Performed at [facility], [address] [place], [place] [address]    Report Status PENDING  Incomplete   Organism ID, Bacteria METHICILLIN RESISTANT STAPHYLOCOCCUS AUREUS  Final      Susceptibility   Methicillin resistant staphylococcus aureus - MIC*    CIPROFLOXACIN <=0.5 SENSITIVE Sensitive     ERYTHROMYCIN >=8 RESISTANT Resistant     GENTAMICIN <=0.5 SENSITIVE Sensitive     OXACILLIN >=4 RESISTANT Resistant      TETRACYCLINE <=1 SENSITIVE Sensitive     VANCOMYCIN 1 SENSITIVE Sensitive     TRIMETH/SULFA <=10 SENSITIVE Sensitive     CLINDAMYCIN <=0.25 SENSITIVE Sensitive     RIFAMPIN <=0.5 SENSITIVE Sensitive     Inducible Clindamycin NEGATIVE Sensitive     LINEZOLID 2 SENSITIVE Sensitive     * METHICILLIN RESISTANT STAPHYLOCOCCUS AUREUS  Blood Culture ID Panel (Reflexed)     Status: Abnormal   Collection Time: 08/02/22  5:20 PM  Result Value Ref Range Status   Enterococcus faecalis NOT DETECTED NOT DETECTED Final   Enterococcus Faecium NOT DETECTED NOT DETECTED Final   Listeria monocytogenes NOT DETECTED NOT DETECTED Final   Staphylococcus species DETECTED (A) NOT DETECTED Final    Comment: CRITICAL RESULT CALLED TO, READ BACK BY AND VERIFIED WITH: SHEEMA HALLAJI 08/03/22 @ 0843 BY SH    Staphylococcus aureus (BCID) DETECTED (A) NOT DETECTED Final    Comment: Methicillin (oxacillin)-resistant Staphylococcus aureus (MRSA). MRSA is predictably resistant to beta-lactam antibiotics (except ceftaroline). Preferred therapy is vancomycin unless clinically contraindicated. Patient requires contact precautions if  hospitalized. CRITICAL RESULT CALLED TO, READ BACK BY AND VERIFIED WITH: SHEEMA HALLAJI 08/03/22 @ 0843 BY SH    Staphylococcus epidermidis NOT DETECTED NOT DETECTED Final   Staphylococcus lugdunensis NOT DETECTED NOT DETECTED Final   Streptococcus species NOT DETECTED NOT DETECTED Final   Streptococcus agalactiae NOT DETECTED NOT DETECTED Final   Streptococcus pneumoniae NOT DETECTED NOT DETECTED Final   Streptococcus pyogenes NOT DETECTED NOT DETECTED Final   A.calcoaceticus-baumannii NOT DETECTED NOT DETECTED Final   Bacteroides fragilis NOT DETECTED NOT DETECTED Final   Enterobacterales NOT DETECTED NOT DETECTED Final   Enterobacter cloacae complex NOT DETECTED NOT DETECTED Final   Escherichia coli NOT DETECTED NOT DETECTED Final   Klebsiella aerogenes NOT DETECTED NOT DETECTED Final    Klebsiella oxytoca NOT DETECTED NOT DETECTED Final   Klebsiella pneumoniae NOT DETECTED NOT DETECTED Final   Proteus species NOT DETECTED NOT DETECTED Final   Salmonella species NOT DETECTED NOT DETECTED Final   Serratia marcescens NOT DETECTED NOT DETECTED Final   Haemophilus influenzae NOT DETECTED NOT DETECTED Final   Neisseria meningitidis NOT  DETECTED NOT DETECTED Final   Pseudomonas aeruginosa NOT DETECTED NOT DETECTED Final   Stenotrophomonas maltophilia NOT DETECTED NOT DETECTED Final   Candida albicans NOT DETECTED NOT DETECTED Final   Candida auris NOT DETECTED NOT DETECTED Final   Candida glabrata NOT DETECTED NOT DETECTED Final   Candida krusei NOT DETECTED NOT DETECTED Final   Candida parapsilosis NOT DETECTED NOT DETECTED Final   Candida tropicalis NOT DETECTED NOT DETECTED Final   Cryptococcus neoformans/gattii NOT DETECTED NOT DETECTED Final   Meth resistant mecA/C and MREJ DETECTED (A) NOT DETECTED Final    Comment: CRITICAL RESULT CALLED TO, READ BACK BY AND VERIFIED WITH: SHEEMA HALLAJI 08/03/22 @ 0843 BY SH Performed at [facility], [address] [place], [place] [address]   Respiratory (~20 pathogens) panel by PCR     Status: None   Collection Time: 08/02/22 11:51 PM   Specimen: Nasopharyngeal Swab; Respiratory  Result Value Ref Range Status   Adenovirus NOT DETECTED NOT DETECTED Final   Coronavirus 229E NOT DETECTED NOT DETECTED Final    Comment: (NOTE) The Coronavirus on the Respiratory Panel, DOES NOT test for the novel  Coronavirus (2019 nCoV)    Coronavirus HKU1 NOT DETECTED NOT DETECTED Final   Coronavirus NL63 NOT DETECTED NOT DETECTED Final   Coronavirus OC43 NOT DETECTED NOT DETECTED Final   Metapneumovirus NOT DETECTED NOT DETECTED Final   Rhinovirus / Enterovirus NOT DETECTED NOT DETECTED Final   Influenza A NOT DETECTED NOT DETECTED Final   Influenza B NOT DETECTED NOT DETECTED Final   Parainfluenza Virus 1 NOT DETECTED NOT DETECTED  Final   Parainfluenza Virus 2 NOT DETECTED NOT DETECTED Final   Parainfluenza Virus 3 NOT DETECTED NOT DETECTED Final   Parainfluenza Virus 4 NOT DETECTED NOT DETECTED Final   Respiratory Syncytial Virus NOT DETECTED NOT DETECTED Final   Bordetella pertussis NOT DETECTED NOT DETECTED Final   Bordetella Parapertussis NOT DETECTED NOT DETECTED Final   Chlamydophila pneumoniae NOT DETECTED NOT DETECTED Final   Mycoplasma pneumoniae NOT DETECTED NOT DETECTED Final    Comment: Performed at [facility], [address], [place], [place] [address]  SARS Coronavirus 2 by RT PCR (hospital order, performed in [facility] hospital lab) *cepheid single result test*     Status: None   Collection Time: 08/02/22 11:51 PM  Result Value Ref Range Status   SARS Coronavirus 2 by RT PCR NEGATIVE NEGATIVE Final    Comment: (NOTE) SARS-CoV-2 target nucleic acids are NOT DETECTED.  The SARS-CoV-2 RNA is generally detectable in upper and lower respiratory specimens during the acute phase of infection. The lowest concentration of SARS-CoV-2 viral copies this assay can detect is 250 copies / mL. A negative result does not preclude SARS-CoV-2 infection and should not be used as the sole basis for treatment or other patient management decisions.  A negative result may occur with improper specimen collection / handling, submission of specimen other than nasopharyngeal swab, presence of viral mutation(s) within the areas targeted by this assay, and inadequate number of viral copies (<250 copies / mL). A negative result must be combined with clinical observations, patient history, and epidemiological information.  Fact Sheet for Patients:   RoadLapTop.co.za  Fact Sheet for Healthcare Providers: http://kim-miller.com/  This test is not yet approved or  cleared by the [place] FDA and has been authorized for detection and/or diagnosis of SARS-CoV-2 by FDA  under an Emergency Use Authorization (EUA).  This EUA will remain in effect (meaning  this test can be used) for the duration of the COVID-19 declaration under Section 564(b)(1) of the Act, 21 U.S.C. section 360bbb-3(b)(1), unless the authorization is terminated or revoked sooner.  Performed at [facility], [address] [place], [place] [address]   Minimum Inhibitory Conc. (1 Drug)     Status: Abnormal (Preliminary result)   Collection Time: 08/03/22  9:00 AM  Result Value Ref Range Status   Min Inhibitory Conc (1 Drug) Preliminary report (A)  Final    Comment: (NOTE) Performed At: [facility] [address] [place], [place] [id number] Jolene Schimke MD XB:[id number]    Source CRE PENDING  Incomplete  Aerobic Culture w Gram Stain (superficial specimen)     Status: None   Collection Time: 08/03/22  8:28 PM   Specimen: Wound  Result Value Ref Range Status   Specimen Description   Final    WOUND Performed at [facility], [address], [place], [place] [address]    Special Requests   Final    LEFT FOOT Performed at [facility], [address] [place], [place] [address]    Gram Stain   Final    RARE WBC PRESENT, PREDOMINANTLY PMN FEW GRAM POSITIVE COCCI    Culture   Final    MODERATE STAPHYLOCOCCUS AUREUS RARE PROTEUS MIRABILIS MODERATE CORYNEBACTERIUM STRIATUM Standardized susceptibility testing for this organism is not available. Performed at [facility], [address], [place], [place] [address]    Report Status 08/07/2022 FINAL  Final   Organism ID, Bacteria STAPHYLOCOCCUS AUREUS  Final   Organism ID, Bacteria PROTEUS MIRABILIS  Final      Susceptibility   Proteus mirabilis - MIC*    AMPICILLIN <=2 SENSITIVE Sensitive     CEFEPIME <=0.12 SENSITIVE Sensitive     CEFTAZIDIME <=1 SENSITIVE Sensitive     CEFTRIAXONE <=0.25 SENSITIVE Sensitive     CIPROFLOXACIN <=0.25 SENSITIVE Sensitive     GENTAMICIN <=1 SENSITIVE  Sensitive     IMIPENEM 2 SENSITIVE Sensitive     TRIMETH/SULFA >=320 RESISTANT Resistant     AMPICILLIN/SULBACTAM <=2 SENSITIVE Sensitive     PIP/TAZO <=4 SENSITIVE Sensitive     * RARE PROTEUS MIRABILIS   Staphylococcus aureus - MIC*    CIPROFLOXACIN <=0.5 SENSITIVE Sensitive     ERYTHROMYCIN >=8 RESISTANT Resistant     GENTAMICIN <=0.5 SENSITIVE Sensitive     OXACILLIN >=4 RESISTANT Resistant     TETRACYCLINE <=1 SENSITIVE Sensitive     VANCOMYCIN 1 SENSITIVE Sensitive     TRIMETH/SULFA <=10 SENSITIVE Sensitive     CLINDAMYCIN <=0.25 SENSITIVE Sensitive     RIFAMPIN <=0.5 SENSITIVE Sensitive     Inducible Clindamycin NEGATIVE Sensitive     LINEZOLID 2 SENSITIVE Sensitive     * MODERATE STAPHYLOCOCCUS AUREUS  MRSA Next Gen by PCR, Nasal     Status: Abnormal   Collection Time: 08/03/22 10:57 PM   Specimen: Nasal Mucosa; Nasal Swab  Result Value Ref Range Status   MRSA by PCR Next Gen DETECTED (A) NOT DETECTED Final    Comment: CRITICAL RESULT CALLED TO, READ BACK BY AND VERIFIED WITH: BILL WENDT RN @0011  08/04/22 ASW (NOTE) The GeneXpert MRSA Assay (FDA approved for NASAL specimens only), is one component of a comprehensive MRSA colonization surveillance program. It is not intended to diagnose MRSA infection nor to guide or monitor treatment for MRSA infections. Test performance is not FDA approved in patients less than 81 years old. Performed at [facility], [address] [place], [place] [address]   Culture, blood (Routine X 2) w Reflex to ID Panel     Status: None   Collection Time: 08/04/22  5:19 AM   Specimen: BLOOD  Result Value Ref Range Status   Specimen Description BLOOD BLOOD RIGHT ARM  Final   Special Requests   Final    BOTTLES DRAWN AEROBIC AND ANAEROBIC Blood Culture adequate volume   Culture   Final    NO GROWTH 5 DAYS Performed at [facility], [address] [place], [place] [address]    Report Status 08/09/2022 FINAL  Final   Culture, blood (Routine X 2) w Reflex to ID Panel     Status: None   Collection Time: 08/04/22  5:19 AM   Specimen: BLOOD  Result Value Ref Range Status   Specimen Description BLOOD BLOOD RIGHT HAND  Final   Special Requests   Final    BOTTLES DRAWN AEROBIC AND ANAEROBIC Blood Culture adequate volume   Culture   Final    NO GROWTH 5 DAYS Performed at [facility], [address] [place], [place] [address]    Report Status 08/09/2022 FINAL  Final    Labs: CBC: Recent Labs  Lab 08/02/22 1720 08/03/22 0458 08/04/22 0519 08/05/22 0516 08/06/22 0552 08/07/22 0446 08/08/22 0532  WBC 13.0*   < > 6.2 5.1 4.5 4.9 5.1  NEUTROABS 11.7*  --   --   --   --   --   --   HGB 12.6*   < > 9.8* 10.5* 9.9* 10.4* 10.0*  HCT 37.9*   < > 28.8* 30.7* 29.2* 30.8* 30.2*  MCV 88.1   < > 86.5 86.5 85.9 86.3 87.3  PLT 278   < > 203 233 200 222 212   < > = values in this interval not displayed.   Basic Metabolic Panel: Recent Labs  Lab 08/04/22 0519 08/05/22 0516 08/06/22 0552 08/07/22 0446 08/08/22 0532 08/09/22 0508  NA 138 138 136 137 138  --   K 2.6* 3.6 3.2* 3.5 3.5  --   CL 103 103 102 103 102  --   CO2 28 28 28 29 30   --   GLUCOSE 76 153* 93 92 102*  --   BUN 8 9 6 9 8   --   CREATININE 0.37* 0.40* 0.42* 0.40* 0.45* 0.47*  CALCIUM 8.2* 8.2* 8.3* 8.4* 8.2*  --   MG 1.8 2.0 2.0 2.1 1.9  --    Liver Function Tests: Recent Labs  Lab 08/02/22 1720  AST 18  ALT 12  ALKPHOS 72  BILITOT 0.8  PROT 8.1  ALBUMIN 3.0*   CBG: Recent Labs  Lab 08/08/22 1135 08/08/22 1738 08/08/22 2120 08/09/22 0843 08/09/22 1139  GLUCAP 118* 118* 142* 70 120*    Discharge time spent: greater than 30 minutes.  This record has been created using Conservation officer, historic buildings. Errors have been sought and corrected,but may not always be located. Such creation errors do not reflect on the standard of care.   Signed: Arnetha Courser, MD [facility] 08/09/2022

## 2022-08-09 NOTE — Progress Notes (Signed)
Pt with h/o dementia, discharge/med review given to his caregiver, set up through TOC. Pt sister [name] notified of pt leaving by this RN and expressed no questions or concerns about his discharge.

## 2022-08-09 NOTE — Plan of Care (Signed)
  Problem: Fluid Volume: Goal: Ability to maintain a balanced intake and output will improve Outcome: Adequate for Discharge   Problem: Coping: Goal: Ability to adjust to condition or change in health will improve Outcome: Adequate for Discharge   Problem: Health Behavior/Discharge Planning: Goal: Ability to identify and utilize available resources and services will improve Outcome: Adequate for Discharge

## 2022-08-09 NOTE — TOC Progression Note (Signed)
Transition of Care [facility]) - Progression Note    Patient Details  Name: [name] MRN: [id number] Date of Birth: February 27, 1960  [facility]) CM/SW Contact  Chapman Fitch, RN Phone Number: 08/09/2022, 9:32 AM  Clinical Narrative:     Per MD plan for PICC line to be placed today Per Pam with [employer] plan for IV antibiotic education with caregiver Baxter Hire at 2pm        Expected Discharge Plan and Services                                               Social Determinants of Health (SDOH) Interventions SDOH Screenings   Food Insecurity: No Food Insecurity (08/03/2022)  Housing: Low Risk  (08/03/2022)  Transportation Needs: No Transportation Needs (08/03/2022)  Utilities: Not At Risk (08/03/2022)  Tobacco Use: High Risk (08/03/2022)    Readmission Risk Interventions     No data to display

## 2022-08-09 NOTE — Progress Notes (Signed)
Pharmacy Antibiotic Note  QAIS [name] is a 62 y.o. male admitted on 08/02/2022 with bacteremia.  Pharmacy has been consulted for daptomycin dosing. Patient noted to have MRSA bacteremia 7/11.  Source unclear at this time but noted to have sacral and L heel wound.  He has PMH PAD with R AKA.   08/09/2022 Day #7 Vancomycin to daptomycin, s/p 5 days ampicillin/sulbactam Renal: SCr 0.47 - stable WBC WNL Afebrile  Repeat Bcx NGTD TEE without evidence of endocarditis 7/17 CK = 29  Previous vancomycin levels 7/13 2142 Vancomycin level (trough) = 8 Increased Vancomycin dose to 1000 mg 12 hr Recheck Vancomycin levels as appropriate.  Plan: Adjust daptomycin to 500mg  (8.5mg /kg) IV q24h CK weekly Monitor on atorvastatin 40mg   Monitor renal function Awaiting final daptomycin MIC result that was sent to [facility] ID consulted  See OPAT orders PICC placement today  Height: 5\' 7"  (170.2 cm) Weight: 58.5 kg (128 lb 15.5 oz) IBW/kg (Calculated) : 66.1  Temp (24hrs), Avg:98.2 F (36.8 C), Min:98 F (36.7 C), Max:98.3 F (36.8 C)  Recent Labs  Lab 08/02/22 1720 08/02/22 1946 08/02/22 2339 08/03/22 0028 08/03/22 0458 08/04/22 0519 08/05/22 0516 08/05/22 2142 08/06/22 0552 08/07/22 0446 08/08/22 0532 08/09/22 0508  WBC 13.0*  --   --   --    < > 6.2 5.1  --  4.5 4.9 5.1  --   CREATININE 0.53*  --   --   --    < > 0.37* 0.40*  --  0.42* 0.40* 0.45* 0.47*  LATICACIDVEN 2.5* 2.3* 1.1 1.2  --   --   --   --   --   --   --   --   VANCORANDOM  --   --   --   --   --   --   --  8  --   --   --   --    < > = values in this interval not displayed.    Estimated Creatinine Clearance: 81.3 mL/min (A) (by C-G formula based on SCr of 0.47 mg/dL (L)).    No Known Allergies  Antimicrobials this admission: 7/16 daptomycin >> 7/10 vanc >>7/16 7/10 cefepime x 1 7/10 amp/sulb >> (7/15) 7/10 azith >> (7/12)  Dose adjustments this admission:  Microbiology results: 7/10 BCx: GPC, BCID  MRSA   Thank you for allowing pharmacy to be a part of this patient's care.  Juliette Alcide, PharmD, BCPS, BCIDP Work Cell: [phone] 08/09/2022 10:26 AM

## 2022-08-09 NOTE — TOC Transition Note (Addendum)
Transition of Care [facility]) - CM/SW Discharge Note   Patient Details  Name: [name] MRN: [id number] Date of Birth: April 20, 1960  [facility]) CM/SW Contact:  Chapman Fitch, RN Phone Number: 08/09/2022, 3:46 PM   Clinical Narrative:        Patient to discharge today EMS packet on chart EMS transport called through [facility] PICC line placed  Kristen patient's caregiver has completed education with Elita Quick with Leron Croak with [facility] notified  Next dose of antibiotic will be at home tomorrow  Sister [name] notified     Update: [facility] no longer able to transport [facility] transport called. 11th on list.  Sister in agreement  Patient Goals and CMS Choice      Discharge Placement                         Discharge Plan and Services Additional resources added to the After Visit Summary for                                       Social Determinants of Health (SDOH) Interventions SDOH Screenings   Food Insecurity: No Food Insecurity (08/03/2022)  Housing: Low Risk  (08/03/2022)  Transportation Needs: No Transportation Needs (08/03/2022)  Utilities: Not At Risk (08/03/2022)  Tobacco Use: High Risk (08/03/2022)     Readmission Risk Interventions     No data to display

## 2022-08-09 NOTE — Progress Notes (Signed)
PHARMACY CONSULT NOTE FOR:  OUTPATIENT  PARENTERAL ANTIBIOTIC THERAPY (OPAT)  Indication: MRSA bacteremia Regimen: daptomycin 500mg  IV q24h End date: 08/31/2022  Labs - Once weekly:  CBC/D, BMP, CPK, ESR and CRP Fax weekly lab results  promptly to [phone] Please pull PIC at completion of IV antibiotics  IV antibiotic discharge orders are pended. To discharging provider:  please sign these orders via discharge navigator,  Select New Orders & click on the button choice - Manage This Unsigned Work.     Thank you for allowing pharmacy to be a part of this patient's care.  Juliette Alcide, PharmD, BCPS, BCIDP Work Cell: [phone] 08/09/2022 10:26 AM

## 2022-08-10 LAB — MINIMUM INHIBITORY CONC. (1 DRUG)

## 2022-08-10 LAB — MIC RESULT

## 2022-08-11 LAB — CULTURE, BLOOD (ROUTINE X 2): Special Requests: ADEQUATE

## 2022-08-22 ENCOUNTER — Inpatient Hospital Stay
Admission: EM | Admit: 2022-08-22 | Discharge: 2022-08-28 | DRG: 872 | Disposition: A | Discharge status: Home Health | Payer: Medicare Other | Attending: Hospitalist | Admitting: Hospitalist

## 2022-08-22 ENCOUNTER — Other Ambulatory Visit: Payer: Self-pay

## 2022-08-22 ENCOUNTER — Emergency Department: Payer: Medicare Other

## 2022-08-22 DIAGNOSIS — Z7984 Long term (current) use of oral hypoglycemic drugs: Secondary | ICD-10-CM

## 2022-08-22 DIAGNOSIS — I10 Essential (primary) hypertension: Secondary | ICD-10-CM

## 2022-08-22 DIAGNOSIS — E785 Hyperlipidemia, unspecified: Secondary | ICD-10-CM | POA: Diagnosis present

## 2022-08-22 DIAGNOSIS — E1151 Type 2 diabetes mellitus with diabetic peripheral angiopathy without gangrene: Secondary | ICD-10-CM | POA: Diagnosis present

## 2022-08-22 DIAGNOSIS — R509 Fever, unspecified: Secondary | ICD-10-CM | POA: Diagnosis not present

## 2022-08-22 DIAGNOSIS — E1165 Type 2 diabetes mellitus with hyperglycemia: Secondary | ICD-10-CM

## 2022-08-22 DIAGNOSIS — B9562 Methicillin resistant Staphylococcus aureus infection as the cause of diseases classified elsewhere: Secondary | ICD-10-CM | POA: Diagnosis present

## 2022-08-22 DIAGNOSIS — Z794 Long term (current) use of insulin: Secondary | ICD-10-CM

## 2022-08-22 DIAGNOSIS — A419 Sepsis, unspecified organism: Secondary | ICD-10-CM | POA: Diagnosis not present

## 2022-08-22 DIAGNOSIS — I739 Peripheral vascular disease, unspecified: Secondary | ICD-10-CM

## 2022-08-22 DIAGNOSIS — Z86711 Personal history of pulmonary embolism: Secondary | ICD-10-CM

## 2022-08-22 DIAGNOSIS — Z7982 Long term (current) use of aspirin: Secondary | ICD-10-CM

## 2022-08-22 DIAGNOSIS — Z1152 Encounter for screening for COVID-19: Secondary | ICD-10-CM

## 2022-08-22 DIAGNOSIS — F0390 Unspecified dementia without behavioral disturbance: Secondary | ICD-10-CM | POA: Diagnosis not present

## 2022-08-22 DIAGNOSIS — Z89611 Acquired absence of right leg above knee: Secondary | ICD-10-CM

## 2022-08-22 DIAGNOSIS — I5032 Chronic diastolic (congestive) heart failure: Secondary | ICD-10-CM

## 2022-08-22 DIAGNOSIS — R1310 Dysphagia, unspecified: Secondary | ICD-10-CM | POA: Diagnosis present

## 2022-08-22 DIAGNOSIS — I110 Hypertensive heart disease with heart failure: Secondary | ICD-10-CM | POA: Diagnosis present

## 2022-08-22 DIAGNOSIS — Z7901 Long term (current) use of anticoagulants: Secondary | ICD-10-CM

## 2022-08-22 DIAGNOSIS — N390 Urinary tract infection, site not specified: Secondary | ICD-10-CM | POA: Diagnosis present

## 2022-08-22 DIAGNOSIS — F17210 Nicotine dependence, cigarettes, uncomplicated: Secondary | ICD-10-CM | POA: Diagnosis present

## 2022-08-22 DIAGNOSIS — I639 Cerebral infarction, unspecified: Secondary | ICD-10-CM

## 2022-08-22 DIAGNOSIS — Z833 Family history of diabetes mellitus: Secondary | ICD-10-CM

## 2022-08-22 DIAGNOSIS — Z8673 Personal history of transient ischemic attack (TIA), and cerebral infarction without residual deficits: Secondary | ICD-10-CM

## 2022-08-22 DIAGNOSIS — L89150 Pressure ulcer of sacral region, unstageable: Secondary | ICD-10-CM | POA: Diagnosis present

## 2022-08-22 DIAGNOSIS — E861 Hypovolemia: Secondary | ICD-10-CM | POA: Diagnosis present

## 2022-08-22 DIAGNOSIS — Z79899 Other long term (current) drug therapy: Secondary | ICD-10-CM

## 2022-08-22 DIAGNOSIS — E876 Hypokalemia: Secondary | ICD-10-CM | POA: Diagnosis not present

## 2022-08-22 DIAGNOSIS — I69351 Hemiplegia and hemiparesis following cerebral infarction affecting right dominant side: Secondary | ICD-10-CM

## 2022-08-22 LAB — URINALYSIS, W/ REFLEX TO CULTURE (INFECTION SUSPECTED)
Bilirubin Urine: NEGATIVE
Glucose, UA: NEGATIVE mg/dL
Ketones, ur: NEGATIVE mg/dL
Nitrite: POSITIVE — AB
Protein, ur: 30 mg/dL — AB
RBC / HPF: >50 RBC/hpf (ref 0–5)
Specific Gravity, Urine: 1.019 (ref 1.005–1.030)
WBC, UA: >50 WBC/hpf (ref 0–5)
pH: 5 (ref 5.0–8.0)

## 2022-08-22 LAB — CBC WITH DIFFERENTIAL/PLATELET
Abs Immature Granulocytes: 0.01 10*3/uL (ref 0.00–0.07)
Basophils Absolute: 0 10*3/uL (ref 0.0–0.1)
Basophils Relative: 0 %
Eosinophils Absolute: 0 10*3/uL (ref 0.0–0.5)
Eosinophils Relative: 0 %
HCT: 31.8 % — ABNORMAL LOW (ref 39.0–52.0)
Hemoglobin: 10.6 g/dL — ABNORMAL LOW (ref 13.0–17.0)
Immature Granulocytes: 0 %
Lymphocytes Relative: 11 %
Lymphs Abs: 0.6 10*3/uL — ABNORMAL LOW (ref 0.7–4.0)
MCH: 29 pg (ref 26.0–34.0)
MCHC: 33.3 g/dL (ref 30.0–36.0)
MCV: 86.9 fL (ref 80.0–100.0)
Monocytes Absolute: 0.2 10*3/uL (ref 0.1–1.0)
Monocytes Relative: 4 %
Neutro Abs: 4.8 10*3/uL (ref 1.7–7.7)
Neutrophils Relative %: 85 %
Platelets: 241 10*3/uL (ref 150–400)
RBC: 3.66 MIL/uL — ABNORMAL LOW (ref 4.22–5.81)
RDW: 12.6 % (ref 11.5–15.5)
WBC: 5.7 10*3/uL (ref 4.0–10.5)
nRBC: 0 % (ref 0.0–0.2)

## 2022-08-22 LAB — COMPREHENSIVE METABOLIC PANEL
ALT: 12 U/L (ref 0–44)
AST: 18 U/L (ref 15–41)
Albumin: 2.9 g/dL — ABNORMAL LOW (ref 3.5–5.0)
Alkaline Phosphatase: 64 U/L (ref 38–126)
Anion gap: 9 (ref 5–15)
BUN: 16 mg/dL (ref 6–20)
CO2: 24 mmol/L (ref 22–32)
Calcium: 8.3 mg/dL — ABNORMAL LOW (ref 8.9–10.3)
Chloride: 101 mmol/L (ref 98–111)
Creatinine, Ser: 0.5 mg/dL — ABNORMAL LOW (ref 0.61–1.24)
GFR, Estimated: >60 mL/min (ref >60)
Glucose, Bld: 108 mg/dL — ABNORMAL HIGH (ref 70–99)
Potassium: 3.4 mmol/L — ABNORMAL LOW (ref 3.5–5.1)
Sodium: 134 mmol/L — ABNORMAL LOW (ref 135–145)
Total Bilirubin: 0.8 mg/dL (ref 0.3–1.2)
Total Protein: 8.3 g/dL — ABNORMAL HIGH (ref 6.5–8.1)

## 2022-08-22 LAB — RESP PANEL BY RT-PCR (RSV, FLU A&B, COVID)  RVPGX2
Influenza A by PCR: NEGATIVE
Influenza B by PCR: NEGATIVE
Resp Syncytial Virus by PCR: NEGATIVE
SARS Coronavirus 2 by RT PCR: NEGATIVE

## 2022-08-22 LAB — LACTIC ACID, PLASMA: Lactic Acid, Venous: 1.9 mmol/L (ref 0.5–1.9)

## 2022-08-22 LAB — PROTIME-INR
INR: 1.5 — ABNORMAL HIGH (ref 0.8–1.2)
Prothrombin Time: 18.3 seconds — ABNORMAL HIGH (ref 11.4–15.2)

## 2022-08-22 LAB — CBG MONITORING, ED: Glucose-Capillary: 97 mg/dL (ref 70–99)

## 2022-08-22 MED ORDER — TAMSULOSIN HCL 0.4 MG PO CAPS
0.4000 mg | ORAL_CAPSULE | Freq: Every day | ORAL | Status: DC
  Administered 2022-08-23 – 2022-08-27 (×5): 0.4 mg via ORAL
  Filled 2022-08-22 (×5): qty 1

## 2022-08-22 MED ORDER — METRONIDAZOLE 500 MG/100ML IV SOLN
500.0000 mg | Freq: Once | INTRAVENOUS | Status: AC
  Administered 2022-08-22: 500 mg via INTRAVENOUS
  Filled 2022-08-22: qty 100

## 2022-08-22 MED ORDER — ONDANSETRON HCL 4 MG PO TABS: 4.0000 mg | ORAL_TABLET | Freq: Four times a day (QID) | ORAL | Status: DC | PRN

## 2022-08-22 MED ORDER — ACETAMINOPHEN 650 MG RE SUPP: 650.0000 mg | Freq: Four times a day (QID) | RECTAL | Status: DC | PRN

## 2022-08-22 MED ORDER — LACTATED RINGERS IV SOLN: INTRAVENOUS | Status: AC

## 2022-08-22 MED ORDER — POLYETHYLENE GLYCOL 3350 17 G PO PACK: 17.0000 g | PACK | Freq: Every day | ORAL | Status: DC | PRN

## 2022-08-22 MED ORDER — ATORVASTATIN CALCIUM 20 MG PO TABS
40.0000 mg | ORAL_TABLET | Freq: Every day | ORAL | Status: DC
  Administered 2022-08-23 – 2022-08-27 (×5): 40 mg via ORAL
  Filled 2022-08-22 (×5): qty 2

## 2022-08-22 MED ORDER — SODIUM CHLORIDE 0.9 % IV BOLUS
500.0000 mL | Freq: Once | INTRAVENOUS | Status: AC
  Administered 2022-08-22: 500 mL via INTRAVENOUS

## 2022-08-22 MED ORDER — BETHANECHOL CHLORIDE 10 MG PO TABS
10.0000 mg | ORAL_TABLET | Freq: Three times a day (TID) | ORAL | Status: DC
  Administered 2022-08-22 – 2022-08-28 (×17): 10 mg via ORAL
  Filled 2022-08-22 (×19): qty 1

## 2022-08-22 MED ORDER — LEVETIRACETAM 500 MG PO TABS
500.0000 mg | ORAL_TABLET | Freq: Two times a day (BID) | ORAL | Status: DC
  Administered 2022-08-22 – 2022-08-28 (×12): 500 mg via ORAL
  Filled 2022-08-22 (×12): qty 1

## 2022-08-22 MED ORDER — VANCOMYCIN HCL 1250 MG/250ML IV SOLN
1250.0000 mg | Freq: Once | INTRAVENOUS | Status: AC
  Administered 2022-08-22: 1250 mg via INTRAVENOUS
  Filled 2022-08-22: qty 250

## 2022-08-22 MED ORDER — ASPIRIN 81 MG PO TBEC
81.0000 mg | DELAYED_RELEASE_TABLET | Freq: Every day | ORAL | Status: DC
  Administered 2022-08-23 – 2022-08-28 (×6): 81 mg via ORAL
  Filled 2022-08-22 (×6): qty 1

## 2022-08-22 MED ORDER — VANCOMYCIN HCL 750 MG/150ML IV SOLN
750.0000 mg | Freq: Two times a day (BID) | INTRAVENOUS | Status: DC
  Administered 2022-08-23: 750 mg via INTRAVENOUS
  Filled 2022-08-22 (×2): qty 150

## 2022-08-22 MED ORDER — LACTATED RINGERS IV BOLUS
1000.0000 mL | Freq: Once | INTRAVENOUS | Status: AC
  Administered 2022-08-22: 1000 mL via INTRAVENOUS

## 2022-08-22 MED ORDER — ACETAMINOPHEN 325 MG PO TABS
650.0000 mg | ORAL_TABLET | Freq: Four times a day (QID) | ORAL | Status: DC | PRN
  Administered 2022-08-25 – 2022-08-27 (×5): 650 mg via ORAL
  Filled 2022-08-22 (×5): qty 2

## 2022-08-22 MED ORDER — APIXABAN 5 MG PO TABS
5.0000 mg | ORAL_TABLET | Freq: Two times a day (BID) | ORAL | Status: DC
  Administered 2022-08-22 – 2022-08-28 (×12): 5 mg via ORAL
  Filled 2022-08-22 (×12): qty 1

## 2022-08-22 MED ORDER — VANCOMYCIN HCL IN DEXTROSE 1-5 GM/200ML-% IV SOLN: 1000.0000 mg | Freq: Once | INTRAVENOUS | Status: DC

## 2022-08-22 MED ORDER — SODIUM CHLORIDE 0.9 % IV SOLN
2.0000 g | Freq: Once | INTRAVENOUS | Status: AC
  Administered 2022-08-22: 2 g via INTRAVENOUS
  Filled 2022-08-22: qty 12.5

## 2022-08-22 MED ORDER — INSULIN GLARGINE-YFGN 100 UNIT/ML ~~LOC~~ SOLN
5.0000 [IU] | Freq: Every day | SUBCUTANEOUS | Status: DC
  Administered 2022-08-22: 5 [IU] via SUBCUTANEOUS
  Filled 2022-08-22 (×2): qty 0.05

## 2022-08-22 MED ORDER — AMLODIPINE BESYLATE 10 MG PO TABS
10.0000 mg | ORAL_TABLET | Freq: Every day | ORAL | Status: DC
  Administered 2022-08-23 – 2022-08-28 (×6): 10 mg via ORAL
  Filled 2022-08-22 (×5): qty 1
  Filled 2022-08-22: qty 2

## 2022-08-22 MED ORDER — FINASTERIDE 5 MG PO TABS
5.0000 mg | ORAL_TABLET | Freq: Every day | ORAL | Status: DC
  Administered 2022-08-23 – 2022-08-28 (×6): 5 mg via ORAL
  Filled 2022-08-22 (×6): qty 1

## 2022-08-22 MED ORDER — SODIUM CHLORIDE 0.9% FLUSH
3.0000 mL | Freq: Two times a day (BID) | INTRAVENOUS | Status: DC
  Administered 2022-08-23 – 2022-08-28 (×10): 3 mL via INTRAVENOUS

## 2022-08-22 MED ORDER — SODIUM CHLORIDE 0.9 % IV SOLN
2.0000 g | INTRAVENOUS | Status: DC
  Administered 2022-08-22 – 2022-08-26 (×5): 2 g via INTRAVENOUS
  Filled 2022-08-22 (×5): qty 20

## 2022-08-22 MED ORDER — INSULIN ASPART 100 UNIT/ML IJ SOLN
0.0000 [IU] | Freq: Three times a day (TID) | INTRAMUSCULAR | Status: DC
  Administered 2022-08-24 – 2022-08-25 (×3): 2 [IU] via SUBCUTANEOUS
  Administered 2022-08-25 – 2022-08-28 (×8): 1 [IU] via SUBCUTANEOUS
  Filled 2022-08-22 (×11): qty 1

## 2022-08-22 MED ORDER — ONDANSETRON HCL 4 MG/2ML IJ SOLN: 4.0000 mg | Freq: Four times a day (QID) | INTRAMUSCULAR | Status: DC | PRN

## 2022-08-22 NOTE — Assessment & Plan Note (Signed)
Continue home Eliquis. 

## 2022-08-22 NOTE — Consult Note (Signed)
PHARMACY -  BRIEF ANTIBIOTIC NOTE   Pharmacy has received consult(s) for vancomycin and cefepime dosing from an ED provider.  The patient's profile has been reviewed for ht/wt/allergies/indication/available labs.    One time order(s) placed for vancomycin 1250 mg IV x 1 and cefepime 2 grams IV x 1.  Further antibiotics/pharmacy consults should be ordered by admitting physician if indicated.                       Thank you, Barrie Folk, PharmD 08/22/2022  3:58 PM

## 2022-08-22 NOTE — ED Notes (Signed)
Pt has not urinated yet. Pt says does not want to be catheterized at this time. Daughter at bedside. Pt has promofit.

## 2022-08-22 NOTE — Assessment & Plan Note (Addendum)
Patient is presenting with fever, tachycardia, and tachypnea.  All vital signs have normalized after receiving IV fluids.  Fever improved without Tylenol.  Although initial concern for sepsis, there is no evidence of endorgan involvement.  Differential includes underlying infection, particularly given patient was recently admitted for MRSA bacteremia.  No focal findings on exam to suggest VTE and patient is on Eliquis.  Left heel ulcer seems to be healing well.  - Telemetry monitoring - Blood cultures pending, including from peripheral and PICC line - Urinalysis with reflex to urine culture pending - Continue broad-spectrum coverage for now with vancomycin and Rocephin - IV fluids as ordered

## 2022-08-22 NOTE — Assessment & Plan Note (Addendum)
-   Hold home antiglycemic agents - SSI, sensitive - Semglee 5 units at bedtime

## 2022-08-22 NOTE — Assessment & Plan Note (Signed)
Per chart review, there has been strong suspicion for underlying dementia, and patient's family endorsed history of dementia today.  He appears at his baseline.  - Delirium precautions - Dysphagia diet 2 as per previous SLP evaluation

## 2022-08-22 NOTE — Assessment & Plan Note (Signed)
No new neurological deficits noted at this time.  - Continue home aspirin, statin and Eliquis

## 2022-08-22 NOTE — Assessment & Plan Note (Signed)
Hypertensive on presentation.  - Continue home antihypertensives
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
## 2022-08-22 NOTE — Assessment & Plan Note (Signed)
Patient appears hypovolemic at this time.  - Will monitor closely for evidence of hypervolemia in the setting of IV fluid

## 2022-08-22 NOTE — Assessment & Plan Note (Signed)
-   Continue home aspirin, statin and Eliquis

## 2022-08-22 NOTE — ED Triage Notes (Signed)
Pt to ED from home [facility] for concerns of AMS and possible sepsis. Pt has PICC line for abx, being treated for MRSA, day 12 of 21. Pt is AMS at baseline and follows commands but per family is usually talking. Pt not responding verbally. Per EMS, pt has strong urine smell and temp with them was 102.2 axillary after IVF.  Pt has R BKA. Did not receive daptomycin yet today. Pt appears diaphoretic. EDP at bedside.

## 2022-08-22 NOTE — ED Provider Notes (Signed)
[facility] Provider Note    Event Date/Time   First MD Initiated Contact with Patient 08/22/22 1500     (approximate)   History   sepsis alert   HPI  DILRAJ HERIN is a 62 y.o. male history of diabetes, osteomyelitis, PEs, stroke,  osteo, multiple ulcers on daptomycin for MRSA bacteremia infection with PICC line on day 12 of antibiotics presents to the ER for altered mental status fever.  Patient denies any pain.  Does appear chronically ill.     Physical Exam   Triage Vital Signs: ED Triage Vitals  Encounter Vitals Group     BP 08/22/22 1504 (!) 145/77     Systolic BP Percentile --      Diastolic BP Percentile --      Pulse Rate 08/22/22 1504 (!) 108     Resp 08/22/22 1504 20     Temp 08/22/22 1504 (!) 101.2 F (38.4 C)     Temp Source 08/22/22 1504 Oral     SpO2 08/22/22 1504 96 %     Weight 08/22/22 1507 130 lb 1.1 oz (59 kg)     Height 08/22/22 1507 5\' 6"  (1.676 m)     Head Circumference --      Peak Flow --      Pain Score --      Pain Loc --      Pain Education --      Exclude from Growth Chart --     Most recent vital signs: Vitals:   08/22/22 1630 08/22/22 1700  BP: (!) 155/79 135/80  Pulse: (!) 105 (!) 104  Resp: (!) 25 (!) 23  Temp:    SpO2: 98% 98%     Constitutional: Alert chronically ill appearing Eyes: Conjunctivae are normal.  Head: Atraumatic. Nose: No congestion/rhinnorhea. Mouth/Throat: Mucous membranes are moist.   Neck: Painless ROM.  Cardiovascular:   Good peripheral circulation. Respiratory: Normal respiratory effort. Mild tachypnea No retractions.  Gastrointestinal: Soft and nontender.  Skin:  Skin is warm, dry and intact. No rash noted.    ED Results / Procedures / Treatments   Labs (all labs ordered are listed, but only abnormal results are displayed) Labs Reviewed  COMPREHENSIVE METABOLIC PANEL - Abnormal; Notable for the following components:      Result Value   Sodium 134 (*)     Potassium 3.4 (*)    Glucose, Bld 108 (*)    Creatinine, Ser 0.50 (*)    Calcium 8.3 (*)    Total Protein 8.3 (*)    Albumin 2.9 (*)    All other components within normal limits  CBC WITH DIFFERENTIAL/PLATELET - Abnormal; Notable for the following components:   RBC 3.66 (*)    Hemoglobin 10.6 (*)    HCT 31.8 (*)    Lymphs Abs 0.6 (*)    All other components within normal limits  PROTIME-INR - Abnormal; Notable for the following components:   Prothrombin Time 18.3 (*)    INR 1.5 (*)    All other components within normal limits  RESP PANEL BY RT-PCR (RSV, FLU A&B, COVID)  [id number]  CULTURE, BLOOD (ROUTINE X 2)  CULTURE, BLOOD (ROUTINE X 2)  LACTIC ACID, PLASMA  URINALYSIS, W/ REFLEX TO CULTURE (INFECTION SUSPECTED)     EKG   RADIOLOGY Please see ED Course for my review and interpretation.  I personally reviewed all radiographic images ordered to evaluate for the above acute complaints and reviewed radiology reports and findings.  These findings were personally discussed with the patient.  Please see medical record for radiology report.    PROCEDURES:  Critical Care performed: No  Procedures   MEDICATIONS ORDERED IN ED: Medications  vancomycin (VANCOREADY) IVPB 1250 mg/250 mL (1,250 mg Intravenous New Bag/Given 08/22/22 1753)  ceFEPIme (MAXIPIME) 2 g in sodium chloride 0.9 % 100 mL IVPB (0 g Intravenous Stopped 08/22/22 1646)  metroNIDAZOLE (FLAGYL) IVPB 500 mg (0 mg Intravenous Stopped 08/22/22 1751)     IMPRESSION / MDM / ASSESSMENT AND PLAN / ED COURSE  I reviewed the triage vital signs and the nursing notes.                              Differential diagnosis includes, but is not limited to, sepsis, pneumonia, UTI, bacteremia, COVID, osteo-, dehydration  Patient presenting to the ER for evaluation of symptoms as described above.  Based on symptoms, risk factors and considered above differential, this presenting complaint could reflect a potentially  life-threatening illness therefore the patient will be placed on continuous pulse oximetry and telemetry for monitoring.  Laboratory evaluation will be sent to evaluate for the above complaints.  Patient with complicated past medical history with recent mission for bacteremia.  Will order sepsis protocol and broad-spectrum antibiotics.    Clinical Course as of 08/22/22 1917  Tue Aug 22, 2022  1910 COVID test is negative.  Sepsis of unknown certain source.  Given recent admission for bacteremia do feel he will need to be observed in the hospital for broad-spectrum IV antibiotics follow-up blood cultures. [PR]    Clinical Course User Index [PR] Willy Eddy, MD     FINAL CLINICAL IMPRESSION(S) / ED DIAGNOSES   Final diagnoses:  Sepsis, due to unspecified organism, unspecified whether acute organ dysfunction present [facility])     Rx / DC Orders   ED Discharge Orders     None        Note:  This document was prepared using Dragon voice recognition software and may include unintentional dictation errors.    Willy Eddy, MD 08/22/22 [phone]

## 2022-08-22 NOTE — ED Notes (Signed)
Pt advised about the need for urine. Pt still does not want to be straight cathed

## 2022-08-22 NOTE — Progress Notes (Signed)
Pharmacy Antibiotic Note  [name] is a 62 y.o. male admitted on 08/22/2022 with bacteremia.  Pharmacy has been consulted for Vancomycin dosing.  Plan: Vancomycin 1250 mg IV X 1 given in ED on 7/30 @ 1753. Vancomycin 750 mg IV Q12H ordered to start on 7/31 @ 0600.   AUC = 487.6 Vanc trough = 13.7   Height: 5\' 6"  (167.6 cm) Weight: 59 kg (130 lb 1.1 oz) IBW/kg (Calculated) : 63.8  Temp (24hrs), Avg:100.1 F (37.8 C), Min:99 F (37.2 C), Max:101.2 F (38.4 C)  Recent Labs  Lab 08/22/22 1512  WBC 5.7  CREATININE 0.50*  LATICACIDVEN 1.9    Estimated Creatinine Clearance: 81.9 mL/min (A) (by C-G formula based on SCr of 0.5 mg/dL (L)).    No Known Allergies  Antimicrobials this admission:   >>    >>   Dose adjustments this admission:   Microbiology results:  BCx:   UCx:    Sputum:    MRSA PCR:   Thank you for allowing pharmacy to be a part of this patient's care.  Jorgina Binning D 08/22/2022 10:02 PM

## 2022-08-23 ENCOUNTER — Encounter: Payer: Self-pay | Admitting: Internal Medicine

## 2022-08-23 DIAGNOSIS — E785 Hyperlipidemia, unspecified: Secondary | ICD-10-CM | POA: Diagnosis present

## 2022-08-23 DIAGNOSIS — A419 Sepsis, unspecified organism: Secondary | ICD-10-CM | POA: Diagnosis present

## 2022-08-23 DIAGNOSIS — Z833 Family history of diabetes mellitus: Secondary | ICD-10-CM | POA: Diagnosis not present

## 2022-08-23 DIAGNOSIS — L89150 Pressure ulcer of sacral region, unstageable: Secondary | ICD-10-CM | POA: Diagnosis present

## 2022-08-23 DIAGNOSIS — R509 Fever, unspecified: Secondary | ICD-10-CM | POA: Diagnosis not present

## 2022-08-23 DIAGNOSIS — B9562 Methicillin resistant Staphylococcus aureus infection as the cause of diseases classified elsewhere: Secondary | ICD-10-CM | POA: Diagnosis present

## 2022-08-23 DIAGNOSIS — F17210 Nicotine dependence, cigarettes, uncomplicated: Secondary | ICD-10-CM | POA: Diagnosis present

## 2022-08-23 DIAGNOSIS — I110 Hypertensive heart disease with heart failure: Secondary | ICD-10-CM | POA: Diagnosis present

## 2022-08-23 DIAGNOSIS — Z89611 Acquired absence of right leg above knee: Secondary | ICD-10-CM | POA: Diagnosis not present

## 2022-08-23 DIAGNOSIS — Z1152 Encounter for screening for COVID-19: Secondary | ICD-10-CM | POA: Diagnosis not present

## 2022-08-23 DIAGNOSIS — Z79899 Other long term (current) drug therapy: Secondary | ICD-10-CM | POA: Diagnosis not present

## 2022-08-23 DIAGNOSIS — Z8673 Personal history of transient ischemic attack (TIA), and cerebral infarction without residual deficits: Secondary | ICD-10-CM

## 2022-08-23 DIAGNOSIS — I5032 Chronic diastolic (congestive) heart failure: Secondary | ICD-10-CM | POA: Diagnosis present

## 2022-08-23 DIAGNOSIS — Z794 Long term (current) use of insulin: Secondary | ICD-10-CM | POA: Diagnosis not present

## 2022-08-23 DIAGNOSIS — E1165 Type 2 diabetes mellitus with hyperglycemia: Secondary | ICD-10-CM | POA: Diagnosis present

## 2022-08-23 DIAGNOSIS — Z7982 Long term (current) use of aspirin: Secondary | ICD-10-CM | POA: Diagnosis not present

## 2022-08-23 DIAGNOSIS — F0390 Unspecified dementia without behavioral disturbance: Secondary | ICD-10-CM | POA: Diagnosis present

## 2022-08-23 DIAGNOSIS — R1310 Dysphagia, unspecified: Secondary | ICD-10-CM | POA: Diagnosis present

## 2022-08-23 DIAGNOSIS — E876 Hypokalemia: Secondary | ICD-10-CM | POA: Diagnosis not present

## 2022-08-23 DIAGNOSIS — I69351 Hemiplegia and hemiparesis following cerebral infarction affecting right dominant side: Secondary | ICD-10-CM | POA: Diagnosis not present

## 2022-08-23 DIAGNOSIS — E1151 Type 2 diabetes mellitus with diabetic peripheral angiopathy without gangrene: Secondary | ICD-10-CM | POA: Diagnosis present

## 2022-08-23 DIAGNOSIS — I10 Essential (primary) hypertension: Secondary | ICD-10-CM | POA: Diagnosis not present

## 2022-08-23 DIAGNOSIS — Z7901 Long term (current) use of anticoagulants: Secondary | ICD-10-CM | POA: Diagnosis not present

## 2022-08-23 DIAGNOSIS — E861 Hypovolemia: Secondary | ICD-10-CM | POA: Diagnosis present

## 2022-08-23 DIAGNOSIS — N390 Urinary tract infection, site not specified: Secondary | ICD-10-CM | POA: Diagnosis present

## 2022-08-23 DIAGNOSIS — Z7984 Long term (current) use of oral hypoglycemic drugs: Secondary | ICD-10-CM | POA: Diagnosis not present

## 2022-08-23 DIAGNOSIS — Z86711 Personal history of pulmonary embolism: Secondary | ICD-10-CM | POA: Diagnosis not present

## 2022-08-23 DIAGNOSIS — L89159 Pressure ulcer of sacral region, unspecified stage: Secondary | ICD-10-CM | POA: Diagnosis not present

## 2022-08-23 LAB — CBG MONITORING, ED
Glucose-Capillary: 134 mg/dL — ABNORMAL HIGH (ref 70–99)
Glucose-Capillary: 65 mg/dL — ABNORMAL LOW (ref 70–99)
Glucose-Capillary: 63 mg/dL — ABNORMAL LOW (ref 70–99)
Glucose-Capillary: 112 mg/dL — ABNORMAL HIGH (ref 70–99)

## 2022-08-23 LAB — GLUCOSE, CAPILLARY
Glucose-Capillary: 117 mg/dL — ABNORMAL HIGH (ref 70–99)
Glucose-Capillary: 108 mg/dL — ABNORMAL HIGH (ref 70–99)

## 2022-08-23 LAB — CK: Total CK: 138 U/L (ref 49–397)

## 2022-08-23 MED ORDER — DEXTROSE 50 % IV SOLN
12.5000 g | INTRAVENOUS | Status: AC
  Administered 2022-08-23: 12.5 g via INTRAVENOUS
  Filled 2022-08-23: qty 50

## 2022-08-23 MED ORDER — VANCOMYCIN HCL IN DEXTROSE 1-5 GM/200ML-% IV SOLN
1000.0000 mg | Freq: Two times a day (BID) | INTRAVENOUS | Status: AC
  Administered 2022-08-23 – 2022-08-25 (×4): 1000 mg via INTRAVENOUS
  Filled 2022-08-23 (×4): qty 200

## 2022-08-23 MED ORDER — POTASSIUM CHLORIDE CRYS ER 20 MEQ PO TBCR
40.0000 meq | EXTENDED_RELEASE_TABLET | Freq: Once | ORAL | Status: AC
  Administered 2022-08-23: 40 meq via ORAL
  Filled 2022-08-23: qty 2

## 2022-08-23 MED ORDER — CHLORHEXIDINE GLUCONATE CLOTH 2 % EX PADS
6.0000 | MEDICATED_PAD | Freq: Every day | CUTANEOUS | Status: DC
  Administered 2022-08-23 – 2022-08-28 (×6): 6 via TOPICAL

## 2022-08-23 NOTE — Progress Notes (Signed)
Progress Note   Patient: [name] ZOX:[id number] DOB: 1960/11/01 DOA: 08/22/2022     0 DOS: the patient was seen and examined on 08/23/2022   Brief hospital course: Taken from H&P.   [name] is a 62 y.o. male with medical history significant of recent MRSA bacteremia 2/2 left heel ulcer on daptomycin, CVA with residual right hemiparesis, PAD s/p stent of right popliteal, previous PE on Eliquis, hypertension, hyperlipidemia, type 2 diabetes, HFpEF, dementia who presents to the ED due to concern for his sepsis.   Patient with history of significant dementia and unable to participate in meaningful history.  He was not sure why he is here.   Per chart review, patient presented via EMS from home due to elevated temperature up to 102.2 at home.  Per family, patient is altered at baseline  ED course: On arrival patient was febrile at 101.2, tachycardic and tachypneic meeting sepsis criteria.  No lactic acidosis.  COVID-19, influenza and RSV negative.  Chest x-ray negative.  UA with concern of UTI. Blood cultures were obtained and patient received cefepime, Flagyl and vancomycin, later continued on ceftriaxone and vancomycin.  7/31: Vital stable, still feeling very weak.  Preliminary blood cultures negative, pending urine cultures.  Assessment and Plan: * Febrile illness Patient is presenting with fever, tachycardia, and tachypnea.  All vital signs have normalized after receiving IV fluids.  Fever improved without Tylenol.  Although initial concern for sepsis, there is no evidence of endorgan involvement.  Differential includes underlying infection, particularly given patient was recently admitted for MRSA bacteremia.  No focal findings on exam to suggest VTE and patient is on Eliquis.  Left heel ulcer seems to be healing well.  Urine looks infected. Preliminary blood cultures negative, urine cultures pending. -Continuing ceftriaxone and vancomycin-we will de-escalate once urine  cultures are available.    Dementia [facility]) Per chart review, there has been strong suspicion for underlying dementia, and patient's family endorsed history of dementia today.  He appears at his baseline.  - Delirium precautions - Dysphagia diet 2 as per previous SLP evaluation  History of pulmonary embolism - Continue home Eliquis  Chronic diastolic CHF (congestive heart failure) (HCC) Patient appears hypovolemic at this time.  - Will monitor closely for evidence of hypervolemia in the setting of IV fluid  Peripheral artery disease (HCC) - Continue home aspirin, statin and Eliquis  History of CVA (cerebrovascular accident) No new neurological deficits noted at this time.  - Continue home aspirin, statin and Eliquis  Essential hypertension Hypertensive on presentation.  - Continue home antihypertensives  Uncontrolled type 2 diabetes mellitus with hyperglycemia, without long-term current use of insulin (HCC) - Hold home antiglycemic agents - SSI, sensitive - Semglee 5 units at bedtime      Subjective: Patient was seen and examined today.  He was feeling little weak.  No other complaints.  Physical Exam: Vitals:   08/23/22 0400 08/23/22 0745 08/23/22 1259 08/23/22 1428  BP: 124/71 116/70 133/78 126/74  Pulse: 76 73 77 89  Resp: 19 14 (!) 8 18  Temp:   98.8 F (37.1 C) 100 F (37.8 C)  TempSrc:   Oral Axillary  SpO2: 99% 98% 100% 100%  Weight:      Height:       General.  Frail gentleman, in no acute distress. Pulmonary.  Lungs clear bilaterally, normal respiratory effort. CV.  Regular rate and rhythm, no JVD, rub or murmur. Abdomen.  Soft, nontender, nondistended, BS positive. CNS.  Alert and oriented .  No focal neurologic deficit. Extremities.  No edema, right AKA Psychiatry.  Judgment and insight appears normal.   Data Reviewed: Prior data reviewed  Family Communication: Called family with no response  Disposition: Status is: Observation The  patient will require care spanning > 2 midnights and should be moved to inpatient because: severity of illness.  Planned Discharge Destination: Home  DVT prophylaxis.  Eliquis Time spent: 45 minutes  This record has been created using Conservation officer, historic buildings. Errors have been sought and corrected,but may not always be located. Such creation errors do not reflect on the standard of care.   Author: Arnetha Courser, MD 08/23/2022 3:05 PM  For on call review [email or web address].

## 2022-08-23 NOTE — Inpatient Diabetes Management (Signed)
Inpatient Diabetes Program Recommendations  AACE/[name]: New Consensus Statement on Inpatient Glycemic Control (2015)  Target Ranges:  Prepandial:   less than 140 mg/dL      Peak postprandial:   less than 180 mg/dL (1-2 hours)      Critically ill patients:  140 - 180 mg/dL   Lab Results  Component Value Date   GLUCAP 65 (L) 08/23/2022   HGBA1C 6.4 (H) 08/03/2022    Latest Reference Range & Units 08/22/22 22:08 08/23/22 08:32  Glucose-Capillary 70 - 99 mg/dL 97 65 (L)  (L): Data is abnormally low  Review of Glycemic Control  Diabetes history: DM2 Outpatient Diabetes medications: Lantus 10 units (not taking), Metformin 500 mg bid Current orders for Inpatient glycemic control: Semglee 5 units every day, Novolog 0-9 units tid  Inpatient Diabetes Program Recommendations:   Please consider: -D/C Semglee   Thank you, [name]. Shadawn Hanaway, RN, MSN, CDE  Diabetes Coordinator Inpatient Glycemic Control Team Team Pager [phone] (8am-5pm) 08/23/2022 11:54 AM

## 2022-08-23 NOTE — Progress Notes (Addendum)
Pharmacy Antibiotic Note  [name] is a 62 y.o. male admitted on 08/22/2022 with history of MSA bacteremia likely from heel ulcer on daptomycin 500mg  IV q24h prior to admission, Last dose reported to be 7/29.  He was previosly on vancomycin and had levels checked 7/13 with dose adjusted from 750mg  q12h to 1gm IV q12h for a trough = 8 mcg/ml.  Pharmacy has been consulted for Vancomycin dosing.  Today, 08/23/2022 Day #1 vancomycin and ceftriaxone (on daptomycin prior to admit) Renal: SCr 0.47 (consistent with recent admission) WBC WNL TM/24h 101.2 Blood and urine cx pending Last dose of daptomycin was scheuled to be 08/31/22 7/31 CK 138  Plan: Based on recent vancomycin levels (mentioned above), change to vancomycin 1gm IV q12h Monitor renal function and cultures CK today 138 No evidence of daptomycin eosinophilic pneumonia    Height: 5\' 6"  (167.6 cm) Weight: 59 kg (130 lb 1.1 oz) IBW/kg (Calculated) : 63.8  Temp (24hrs), Avg:99.7 F (37.6 C), Min:99 F (37.2 C), Max:101.2 F (38.4 C)  Recent Labs  Lab 08/22/22 1512 08/23/22 0415  WBC 5.7 5.8  CREATININE 0.50* 0.47*  LATICACIDVEN 1.9  --     Estimated Creatinine Clearance: 81.9 mL/min (A) (by C-G formula based on SCr of 0.47 mg/dL (L)).    No Known Allergies  Antimicrobials this admission: Daptomycin proir to admit 7/30 vancomycin >> 7/30 ceftriaxone >> 7/30 metronidazole x 1  Dose adjustments this admission:   Microbiology results: 7/30 BCx:   7/30 UCx:     Thank you for allowing pharmacy to be a part of this patient's care.  Juliette Alcide, PharmD, BCPS, BCIDP Work Cell: [phone] 08/23/2022 1:00 PM

## 2022-08-23 NOTE — Hospital Course (Addendum)
Taken from H&P.   [name] is a 62 y.o. male with medical history significant of recent MRSA bacteremia 2/2 left heel ulcer on daptomycin, CVA with residual right hemiparesis, PAD s/p stent of right popliteal, previous PE on Eliquis, hypertension, hyperlipidemia, type 2 diabetes, HFpEF, dementia who presents to the ED due to concern for his sepsis.   Patient with history of significant dementia and unable to participate in meaningful history.  He was not sure why he is here.   Per chart review, patient presented via EMS from home due to elevated temperature up to 102.2 at home.  Per family, patient is altered at baseline  ED course: On arrival patient was febrile at 101.2, tachycardic and tachypneic meeting sepsis criteria.  No lactic acidosis.  COVID-19, influenza and RSV negative.  Chest x-ray negative.  UA with concern of UTI. Blood cultures were obtained and patient received cefepime, Flagyl and vancomycin, later continued on ceftriaxone and vancomycin.  7/31: Vital stable, still feeling very weak.  Preliminary blood cultures negative, pending urine cultures.  8/1: Afebrile this morning but maximum temperature recorded 100.7 over the past 24 hours.  Urine culture with multiple species and blood cultures remain negative.

## 2022-08-24 ENCOUNTER — Telehealth: Payer: Medicare Other | Admitting: Infectious Diseases

## 2022-08-24 DIAGNOSIS — R509 Fever, unspecified: Secondary | ICD-10-CM | POA: Diagnosis not present

## 2022-08-24 DIAGNOSIS — I5032 Chronic diastolic (congestive) heart failure: Secondary | ICD-10-CM | POA: Diagnosis not present

## 2022-08-24 DIAGNOSIS — I10 Essential (primary) hypertension: Secondary | ICD-10-CM | POA: Diagnosis not present

## 2022-08-24 DIAGNOSIS — F0390 Unspecified dementia without behavioral disturbance: Secondary | ICD-10-CM | POA: Diagnosis not present

## 2022-08-24 LAB — GLUCOSE, CAPILLARY
Glucose-Capillary: 97 mg/dL (ref 70–99)
Glucose-Capillary: 116 mg/dL — ABNORMAL HIGH (ref 70–99)
Glucose-Capillary: 182 mg/dL — ABNORMAL HIGH (ref 70–99)
Glucose-Capillary: 200 mg/dL — ABNORMAL HIGH (ref 70–99)

## 2022-08-24 MED ORDER — SODIUM CHLORIDE 0.9 % IV SOLN
500.0000 mg | Freq: Every day | INTRAVENOUS | Status: DC
  Administered 2022-08-25 – 2022-08-28 (×4): 500 mg via INTRAVENOUS
  Filled 2022-08-24 (×4): qty 10

## 2022-08-24 NOTE — Progress Notes (Signed)
The patient's sister [name] would like a call every day to up date her about her brother's plan of care.

## 2022-08-24 NOTE — Progress Notes (Signed)
PHARMACY CONSULT NOTE FOR:  OUTPATIENT  PARENTERAL ANTIBIOTIC THERAPY (OPAT)  Indication: MRSA bacteremia Regimen: daptomycin 500mg  IV q24h End date: 08/31/2022  Labs - Once weekly:  CBC/D, BMP, CPK, ESR and CRP Fax weekly lab results  promptly to [phone] Please pull PIC at completion of IV antibiotics  IV antibiotic discharge orders are pended. To discharging provider:  please sign these orders via discharge navigator,  Select New Orders & click on the button choice - Manage This Unsigned Work.     Thank you for allowing pharmacy to be a part of this patient's care.  Juliette Alcide, PharmD, BCPS, BCIDP Work Cell: [phone] 08/24/2022 5:00 PM

## 2022-08-24 NOTE — TOC Initial Note (Signed)
Transition of Care [facility]) - Initial/Assessment Note    Patient Details  Name: [name] MRN: [id number] Date of Birth: Dec 30, 1960  Transition of Care [facility]) CM/SW Contact:    Margarito Liner, LCSW Phone Number: 08/24/2022, 8:59 AM  Clinical Narrative:  Patient is active with [facility] for PT, OT, RN. He is also active with [facility] for IV abx. Will follow for return home with home health. Per chart review, patient was at [facility] 01/02/22-07/07/22 (186 days).                Expected Discharge Plan: Home w Home Health Services Barriers to Discharge: Continued Medical Work up   Patient Goals and CMS Choice     Choice offered to / list presented to : NA      Expected Discharge Plan and Services       Living arrangements for the past 2 months: Apartment                           HH Arranged: RN, PT, OT, IV Antibiotics HH Agency: [facility] Date HH Agency Contacted: 08/24/22   Representative spoke with at [facility] Agency: [facility]: Feliberto Gottron, [facility]: Jeri Modena  Prior Living Arrangements/Services Living arrangements for the past 2 months: Apartment   Patient language and need for interpreter reviewed:: Yes        Need for Family Participation in Patient Care: Yes (Comment) Care giver support system in place?: Yes (comment) Current home services: DME, Home OT, Home PT, Home RN, Other (comment) (IV antibiotics) Criminal Activity/Legal Involvement Pertinent to Current Situation/Hospitalization: No - Comment as needed  Activities of Daily Living Home Assistive Devices/Equipment: Wheelchair ADL Screening (condition at time of admission) Patient's cognitive ability adequate to safely complete daily activities?: No Is the patient deaf or have difficulty hearing?: No Does the patient have difficulty seeing, even when wearing glasses/contacts?: No Does the patient have difficulty concentrating, remembering, or making  decisions?: Yes Patient able to express need for assistance with ADLs?: Yes Does the patient have difficulty dressing or bathing?: Yes Independently performs ADLs?: No Communication: Independent Dressing (OT): Needs assistance Is this a change from baseline?: Pre-admission baseline Grooming: Needs assistance Is this a change from baseline?: Pre-admission baseline Feeding: Needs assistance Is this a change from baseline?: Pre-admission baseline Bathing: Needs assistance Is this a change from baseline?: Pre-admission baseline Toileting: Needs assistance Is this a change from baseline?: Pre-admission baseline In/Out Bed: Needs assistance Is this a change from baseline?: Pre-admission baseline Walks in Home: Needs assistance Is this a change from baseline?: Pre-admission baseline Does the patient have difficulty walking or climbing stairs?: Yes Weakness of Legs: Right Weakness of Arms/Hands: None  Permission Sought/Granted         Permission granted to share info w AGENCY: [facility], [facility]        Emotional Assessment       Orientation: : Oriented to Self Alcohol / Substance Use: Not Applicable Psych Involvement: No (comment)  Admission diagnosis:  Sepsis (HCC) [A41.9] Sepsis, due to unspecified organism, unspecified whether acute organ dysfunction present [facility]) [A41.9] Febrile illness [R50.9] Patient Active Problem List   Diagnosis Date Noted   Febrile illness 08/22/2022   Dementia (HCC) 08/22/2022   Bacteremia 08/08/2022   Pressure injury of skin 08/08/2022   MRSA bacteremia 08/04/2022   Pneumonia 08/02/2022   S/P AKA (above knee amputation), right (HCC) 12/30/2021   Protein-calorie malnutrition, severe  12/28/2021   Acute lower limb ischemia 12/26/2021   Hemiparesis affecting right side as late effect of cerebrovascular accident (CVA) (HCC) 12/26/2021   History of pulmonary embolism 12/26/2021   Chronic anticoagulation 12/26/2021   BPH (benign prostatic  hyperplasia) 12/26/2021   Diastolic dysfunction 07/15/2021   Hyperlipidemia LDL goal <70 07/15/2021   Atherosclerosis of native arteries of the extremities with ulceration (HCC) 07/01/2021   Fever    Osteomyelitis (HCC)    Controlled type 2 diabetes mellitus with hyperglycemia, without long-term current use of insulin (HCC)    Pulmonary embolism (HCC) 05/06/2021   Chronic diastolic CHF (congestive heart failure) (HCC) 05/02/2021   Hyponatremia 05/02/2021   Acute urinary retention 05/01/2021   Peripheral artery disease (HCC) 04/30/2021   Foot osteomyelitis, right (HCC) 04/27/2021   Open wound of right foot 04/26/2021   Generalized weakness 04/26/2021   Acute pulmonary embolism (HCC) 04/25/2021   Elevated troponin    History of CVA (cerebrovascular accident) 04/23/2021   Seizure disorder (HCC) 09/16/2018   Essential hypertension 09/14/2018   TIA (transient ischemic attack) 09/14/2018   Right sided weakness 09/14/2018   Vitamin B12 deficiency 02/02/2018   Stroke (HCC) 01/27/2018   Ataxia 01/26/2018   Hypertensive urgency 01/26/2018   Uncontrolled type 2 diabetes mellitus with hyperglycemia, without long-term current use of insulin (HCC) 01/26/2018   Hypokalemia 01/26/2018   Peripheral polyneuropathy 03/25/2015   Toe gangrene (HCC) 10/30/2014   PCP:  Marya Fossa, PA-C Pharmacy:   [facility] - [place], [place] - [address] [place] [address] Phone: [phone] Fax: [phone]  [facility] [address]), [place] - [address] [place] [address]) [place] [address] Phone: [phone] Fax: [phone]     Social Determinants of Health (SDOH) Social History: SDOH Screenings   Food Insecurity: Patient Unable To Answer (08/23/2022)  Housing: Patient Unable To Answer (08/23/2022)  Transportation Needs: Patient Unable To Answer (08/23/2022)  Utilities: Patient Unable To Answer  (08/23/2022)  Tobacco Use: High Risk (08/23/2022)   SDOH Interventions:     Readmission Risk Interventions     No data to display

## 2022-08-24 NOTE — Plan of Care (Addendum)
The patient has  been stable. IV antibiotics currently running. Bed alarm in place. Family at bedside. Call bell at reach. Problem: Education: Goal: Ability to describe self-care measures that may prevent or decrease complications (Diabetes Survival Skills Education) will improve Outcome: Progressing Goal: Individualized Educational Video(s) Outcome: Progressing   Problem: Coping: Goal: Ability to adjust to condition or change in health will improve Outcome: Progressing   Problem: Fluid Volume: Goal: Ability to maintain a balanced intake and output will improve Outcome: Progressing   Problem: Health Behavior/Discharge Planning: Goal: Ability to identify and utilize available resources and services will improve Outcome: Progressing Goal: Ability to manage health-related needs will improve Outcome: Progressing   Problem: Metabolic: Goal: Ability to maintain appropriate glucose levels will improve Outcome: Progressing   Problem: Nutritional: Goal: Maintenance of adequate nutrition will improve Outcome: Progressing Goal: Progress toward achieving an optimal weight will improve Outcome: Progressing   Problem: Skin Integrity: Goal: Risk for impaired skin integrity will decrease Outcome: Progressing   Problem: Tissue Perfusion: Goal: Adequacy of tissue perfusion will improve Outcome: Progressing   Problem: Education: Goal: Knowledge of General Education information will improve Description: Including pain rating scale, medication(s)/side effects and non-pharmacologic comfort measures Outcome: Progressing   Problem: Health Behavior/Discharge Planning: Goal: Ability to manage health-related needs will improve Outcome: Progressing   Problem: Clinical Measurements: Goal: Ability to maintain clinical measurements within normal limits will improve Outcome: Progressing Goal: Will remain free from infection Outcome: Progressing Goal: Diagnostic test results will  improve Outcome: Progressing Goal: Respiratory complications will improve Outcome: Progressing Goal: Cardiovascular complication will be avoided Outcome: Progressing   Problem: Activity: Goal: Risk for activity intolerance will decrease Outcome: Progressing   Problem: Nutrition: Goal: Adequate nutrition will be maintained Outcome: Progressing   Problem: Coping: Goal: Level of anxiety will decrease Outcome: Progressing   Problem: Elimination: Goal: Will not experience complications related to bowel motility Outcome: Progressing Goal: Will not experience complications related to urinary retention Outcome: Progressing   Problem: Pain Managment: Goal: General experience of comfort will improve Outcome: Progressing   Problem: Safety: Goal: Ability to remain free from injury will improve Outcome: Progressing   Problem: Skin Integrity: Goal: Risk for impaired skin integrity will decrease Outcome: Progressing

## 2022-08-24 NOTE — Assessment & Plan Note (Signed)
Hypertensive on presentation.  - Continue home antihypertensives

## 2022-08-24 NOTE — Assessment & Plan Note (Signed)
Patient is presenting with fever, tachycardia, and tachypnea.  All vital signs have normalized after receiving IV fluids.  Fever improved without Tylenol.  Although initial concern for sepsis, there is no evidence of endorgan involvement.  Differential includes underlying infection, particularly given patient was recently admitted for MRSA bacteremia.  No focal findings on exam to suggest VTE and patient is on Eliquis.  Left heel ulcer seems to be healing well.  Urine looks infected, urine cultures with multiple organisms Preliminary blood cultures negative -Continuing ceftriaxone and vancomycin-still had fever in the past 24 hours.

## 2022-08-24 NOTE — Plan of Care (Signed)
  Problem: Safety: Goal: Ability to remain free from injury will improve Outcome: Progressing   Problem: Skin Integrity: Goal: Risk for impaired skin integrity will decrease Outcome: Progressing   Problem: Elimination: Goal: Will not experience complications related to urinary retention Outcome: Progressing   Problem: Elimination: Goal: Will not experience complications related to bowel motility Outcome: Progressing   Problem: Coping: Goal: Level of anxiety will decrease Outcome: Progressing   Problem: Skin Integrity: Goal: Risk for impaired skin integrity will decrease Outcome: Progressing   Problem: Health Behavior/Discharge Planning: Goal: Ability to manage health-related needs will improve Outcome: Progressing

## 2022-08-24 NOTE — Progress Notes (Signed)
Pharmacy Antibiotic Note  [name] is a 62 y.o. male admitted on 08/22/2022 with history of MSA bacteremia likely from heel ulcer on daptomycin 500mg  IV q24h prior to admission, Last dose reported to be 7/29.  He was previosly on vancomycin and had levels checked 7/13 with dose adjusted from 750mg  q12h to 1gm IV q12h for a trough = 8 mcg/ml.  Pharmacy has been consulted for Vancomycin dosing.  Today, 08/24/2022 Day #2 vancomycin and ceftriaxone (on daptomycin prior to admit) Renal: SCr 0.47 (consistent with recent admission) WBC WNL TM/24h 101.2 Blood cx NGTD urine cx: mx species Last dose of daptomycin was scheuled to be 08/31/22 7/31 CK 138  Plan: After discussion with Dr Nelson Chimes,  will resume daptomycin 500mg  IV q24h tomorrow, 8/2 at 2p. Will get vancomycin 1gm IV with last dose 8/2 at 6am Monitor renal function and cultures CK today 138. Recheck weekly No evidence or concern for daptomycin eosinophilic pneumonia  Re-pended/saved previous OPAT order for Daptomycin 500mg  IV q24h until 8/8   Height: 5\' 6"  (167.6 cm) Weight: 57.7 kg (127 lb 3.3 oz) IBW/kg (Calculated) : 63.8  Temp (24hrs), Avg:99.1 F (37.3 C), Min:98 F (36.7 C), Max:100.7 F (38.2 C)  Recent Labs  Lab 08/22/22 1512 08/23/22 0415  WBC 5.7 5.8  CREATININE 0.50* 0.47*  LATICACIDVEN 1.9  --     Estimated Creatinine Clearance: 80.1 mL/min (A) (by C-G formula based on SCr of 0.47 mg/dL (L)).    No Known Allergies  Antimicrobials this admission: Daptomycin proir to admit 7/30 vancomycin >> 7/30 ceftriaxone >> 7/30 metronidazole x 1  Dose adjustments this admission:   Microbiology results: 7/30 BCx: NGTD  7/30 UCx:  Mx species   Thank you for allowing pharmacy to be a part of this patient's care.  Juliette Alcide, PharmD, BCPS, BCIDP Work Cell: [phone] 08/24/2022 4:49 PM

## 2022-08-24 NOTE — Progress Notes (Signed)
Progress Note   Patient: [name] AVW:[id number] DOB: 1960/11/28 DOA: 08/22/2022     1 DOS: the patient was seen and examined on 08/24/2022   Brief hospital course: Taken from H&P.   SHERILL HARKER is a 62 y.o. male with medical history significant of recent MRSA bacteremia 2/2 left heel ulcer on daptomycin, CVA with residual right hemiparesis, PAD s/p stent of right popliteal, previous PE on Eliquis, hypertension, hyperlipidemia, type 2 diabetes, HFpEF, dementia who presents to the ED due to concern for his sepsis.   Patient with history of significant dementia and unable to participate in meaningful history.  He was not sure why he is here.   Per chart review, patient presented via EMS from home due to elevated temperature up to 102.2 at home.  Per family, patient is altered at baseline  ED course: On arrival patient was febrile at 101.2, tachycardic and tachypneic meeting sepsis criteria.  No lactic acidosis.  COVID-19, influenza and RSV negative.  Chest x-ray negative.  UA with concern of UTI. Blood cultures were obtained and patient received cefepime, Flagyl and vancomycin, later continued on ceftriaxone and vancomycin.  7/31: Vital stable, still feeling very weak.  Preliminary blood cultures negative, pending urine cultures.  8/1: Afebrile this morning but maximum temperature recorded 100.7 over the past 24 hours.  Urine culture with multiple species and blood cultures remain negative.  Assessment and Plan: * Febrile illness Patient is presenting with fever, tachycardia, and tachypnea.  All vital signs have normalized after receiving IV fluids.  Fever improved without Tylenol.  Although initial concern for sepsis, there is no evidence of endorgan involvement.  Differential includes underlying infection, particularly given patient was recently admitted for MRSA bacteremia.  No focal findings on exam to suggest VTE and patient is on Eliquis.  Left heel ulcer seems to be healing  well.  Urine looks infected, urine cultures with multiple organisms Preliminary blood cultures negative -Continuing ceftriaxone and vancomycin-still had fever in the past 24 hours.   Dementia [facility]) Per chart review, there has been strong suspicion for underlying dementia, and patient's family endorsed history of dementia today.  He appears at his baseline.  - Delirium precautions - Dysphagia diet 2 as per previous SLP evaluation  History of pulmonary embolism - Continue home Eliquis  Chronic diastolic CHF (congestive heart failure) (HCC) Patient appears hypovolemic at this time.  - Will monitor closely for evidence of hypervolemia in the setting of IV fluid  Peripheral artery disease (HCC) - Continue home aspirin, statin and Eliquis  History of CVA (cerebrovascular accident) No new neurological deficits noted at this time.  - Continue home aspirin, statin and Eliquis  Essential hypertension Hypertensive on presentation. - Continue home antihypertensives  Uncontrolled type 2 diabetes mellitus with hyperglycemia, without long-term current use of insulin (HCC) - Hold home antiglycemic agents - SSI, sensitive - Semglee 5 units at bedtime      Subjective: Patient continued to feel weak.  No other complaints.  Physical Exam: Vitals:   08/23/22 1428 08/23/22 2201 08/24/22 0500 08/24/22 0755  BP: 126/74 129/71 121/70 127/70  Pulse: 89 90 77 72  Resp: 18 16 16 18   Temp: 100 F (37.8 C) (!) 100.7 F (38.2 C) 98.7 F (37.1 C) 98 F (36.7 C)  TempSrc: Axillary Oral Oral Axillary  SpO2: 100% 100% 100% 100%  Weight:   57.7 kg   Height:       General.  Frail and malnourished gentleman, in no acute distress.  Pulmonary.  Lungs clear bilaterally, normal respiratory effort. CV.  Regular rate and rhythm, no JVD, rub or murmur. Abdomen.  Soft, nontender, nondistended, BS positive. CNS.  Alert and oriented .  No focal neurologic deficit. Extremities.  No edema, right  AKA Psychiatry.  Judgment and insight appears normal.   Data Reviewed: Prior data reviewed  Family Communication: Unable to reach any family member on phone  Disposition: Status is: Inpatient The patient will require care spanning > 2 midnights and should be moved to inpatient because: severity of illness.  Planned Discharge Destination: Home  DVT prophylaxis.  Eliquis Time spent: 44 minutes  This record has been created using Conservation officer, historic buildings. Errors have been sought and corrected,but may not always be located. Such creation errors do not reflect on the standard of care.   Author: Arnetha Courser, MD 08/24/2022 1:32 PM  For on call review [email or web address].

## 2022-08-25 DIAGNOSIS — R509 Fever, unspecified: Secondary | ICD-10-CM | POA: Diagnosis not present

## 2022-08-25 DIAGNOSIS — L89159 Pressure ulcer of sacral region, unspecified stage: Secondary | ICD-10-CM

## 2022-08-25 LAB — GLUCOSE, CAPILLARY
Glucose-Capillary: 165 mg/dL — ABNORMAL HIGH (ref 70–99)
Glucose-Capillary: 136 mg/dL — ABNORMAL HIGH (ref 70–99)
Glucose-Capillary: 160 mg/dL — ABNORMAL HIGH (ref 70–99)
Glucose-Capillary: 114 mg/dL — ABNORMAL HIGH (ref 70–99)

## 2022-08-25 MED ORDER — MEDIHONEY WOUND/BURN DRESSING EX PSTE
1.0000 | PASTE | Freq: Every day | CUTANEOUS | Status: DC
  Administered 2022-08-25 – 2022-08-28 (×4): 1 via TOPICAL
  Filled 2022-08-25: qty 44

## 2022-08-25 MED ORDER — ORAL CARE MOUTH RINSE: 15.0000 mL | OROMUCOSAL | Status: DC | PRN

## 2022-08-25 NOTE — Plan of Care (Signed)
Sacral wound evaluated by [facility] nurse and surgeon. Patient tolerating diet well

## 2022-08-25 NOTE — TOC Progression Note (Signed)
Transition of Care [facility]) - Progression Note    Patient Details  Name: [name] MRN: [id number] Date of Birth: July 13, 1960  [facility] of Care [facility]) CM/SW Contact  Margarito Liner, LCSW Phone Number: 08/25/2022, 11:31 AM  Clinical Narrative:   CSW acknowledges SNF consult. CSW notified MD that patient was recently discharged from SNF in June after 186 days so he would have to pay privately to admit to SNF again.  Expected Discharge Plan: Home w Home Health Services Barriers to Discharge: Continued Medical Work up  Expected Discharge Plan and Services       Living arrangements for the past 2 months: Apartment                           HH Arranged: RN, PT, OT, IV Antibiotics HH Agency: [facility] Date HH Agency Contacted: 08/24/22   Representative spoke with at [facility] Agency: [facility]: Feliberto Gottron, [facility]: Jeri Modena   Social Determinants of Health (SDOH) Interventions SDOH Screenings   Food Insecurity: Patient Unable To Answer (08/23/2022)  Housing: Patient Unable To Answer (08/23/2022)  Transportation Needs: Patient Unable To Answer (08/23/2022)  Utilities: Patient Unable To Answer (08/23/2022)  Tobacco Use: High Risk (08/23/2022)    Readmission Risk Interventions     No data to display
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
## 2022-08-25 NOTE — Plan of Care (Signed)
  Problem: Fluid Volume: Goal: Ability to maintain a balanced intake and output will improve Outcome: Progressing   Problem: Metabolic: Goal: Ability to maintain appropriate glucose levels will improve Outcome: Progressing   Problem: Skin Integrity: Goal: Risk for impaired skin integrity will decrease Outcome: Progressing   Problem: Safety: Goal: Ability to remain free from injury will improve Outcome: Progressing

## 2022-08-25 NOTE — Progress Notes (Signed)
Progress Note   Patient: [name] AOZ:[id number] DOB: 12-26-1960 DOA: 08/22/2022     2 DOS: the patient was seen and examined on 08/25/2022   Brief hospital course: In summary, patient is a 62 y.o. male with medical history significant for advanced dementia, recent MRSA bacteremia 2/2 left heel ulcer on daptomycin, CVA with residual right hemiparesis, PAD s/p stent of right popliteal, previous PE on Eliquis, hypertension, hyperlipidemia, type 2 diabetes, HFpEF, dementia who presented to the ED due to concern for sepsis. On arrival patient was febrile at 101.2, tachycardic and tachypneic meeting sepsis criteria.  No lactic acidosis.  COVID-19, influenza and RSV negative.  Chest x-ray negative.  UA with concern of UTI.Blood cultures were obtained and patient received cefepime, Flagyl and vancomycin, later continued on ceftriaxone and vancomycin.Blood cultrues thus far NGTD. Urine cultures showed mixed flora.   7/31: Vital stable, still feeling very weak.  Preliminary blood cultures negative, pending urine cultures.  8/1: Afebrile this morning but maximum temperature recorded 100.7 over the past 24 hours.  Urine culture with multiple species and blood cultures remain negative.  8/2. Found with unstagebale decubitus ulcer on exam with dark eschar. WOCN consulted and General surgery consulted to evaluate for debridement.  Assessment and Plan: Febrile illness:Source of infection likely from decubitus ulcer, unstageable. Other possible sources of infection ruled out. Patient was recently admitted for MRSA bacteremia.Urine cultures with mixed flora.Blood cultures negative Continue ceftriaxone and vancomycin pending general surgery eval and wound cultrues in possible.Discussed with Dr Everlene Farrier, no immediate plans for debridement.Further discussion with family regarding GOC.   Advanced Dementia (HCC) withiut behavioral abnormalities Patient's family endorsed history of dementia .  He appears at his  baseline. Delirium precautions. Dysphagia diet 2 as per previous SLP evaluation  History of pulmonary embolism On home Eliquis. This may be held if debridement anticipapted  Chronic diastolic CHF (congestive heart failure) (HCC) Looks compensated.Continue with GDMT  Peripheral artery disease (HCC): Continue home aspirin, statin and Eliquis  History of CVA (cerebrovascular accident) No new neurological deficits noted at this time.Continue home aspirin, statin and Eliquis  Essential hypertension: Continue home antihypertensives  Uncontrolled type 2 diabetes mellitus with hyperglycemia, without long-term current use of insulin (HCC)- Last A1c was 6.4. Continue with SSI, sensitive. Semglee 5 units at bedtime  Body mass index is 20.53 kg/m  Patient is high risk for deconditioning: PTOT to work with patient.   Palliative care consult to help with complex decision making    Subjective: Patient is a poor historian.No events reported overnight except for low grade fever of 99.13F. Sacral decubitus ulcer noted on exam  Physical Exam: Vitals:   08/24/22 1514 08/24/22 1942 08/25/22 0650 08/25/22 0745  BP: 122/72 115/71 123/69 121/69  Pulse: 83 87 75 70  Resp: 18 18  18   Temp: 98.9 F (37.2 C) 99 F (37.2 C)  99.3 F (37.4 C)  TempSrc: Oral Oral  Oral  SpO2: 98% 100% 100% 100%  Weight:      Height:       GEN: Patient was awake . Confused at baseline but appropriate in behaviour HEENT:Head atraumatic. Neck supple. No JVD CHEST: Clinically diminished bilaterally.Chest tube insitu and attached to Water seal Abdomen: Soft , NT CVS: S1,S2 wnm or gallops CNS: No focal deficits Skin: Unstageable sacral decubitus ulcer with dark eschar+  Data Reviewed:   Family Communication: No family at bedside at this time  Disposition: Status is: Inpatient Remains inpatient appropriate because: Fever, Unstageable decub ulcer thought to  be likely the source of fever  Planned Discharge  Destination:  TBD    Time spent: 32 minutes  Author: Lilia Pro, MD 08/25/2022 1:59 PM  For on call review [email or web address].

## 2022-08-25 NOTE — Consult Note (Addendum)
WOC Nurse Consult Note: Reason for Consult: Consult requested for sacrum wound.  Wound type: Unstageable pressure injury; 100% tightly adhered black eschar, 5X5cm, some odor, small amt tan drainage.  Pressure Injury POA: Yes; noted as present on admission according the the bedside nursing wound flow sheet. Pt could benefit from a surgical consult to determine if debridement is indicated. Secure chat message sent to the primary team to inform them. Dressing procedure/placement/frequency: Topical treatment orders provided for bedside nurses to perform as follows to assist with removal of nonviable tissue: Apply Medihoney to sacrum wound Q day then cover with foam dressing. Change foam dressing Q 3 days or PRN soiling. Please re-consult if further assistance is needed.  Thank-you,  Cammie Mcgee MSN, RN, CWOCN, [place], CNS [phone]

## 2022-08-25 NOTE — Care Management Important Message (Signed)
Important Message  Patient Details  Name: [name] MRN: [id number] Date of Birth: 01/06/1961   Medicare Important Message Given:  N/A - LOS <3 / Initial given by admissions     Olegario Messier A  08/25/2022, 9:02 AM

## 2022-08-26 DIAGNOSIS — R509 Fever, unspecified: Secondary | ICD-10-CM | POA: Diagnosis not present

## 2022-08-26 LAB — GLUCOSE, CAPILLARY
Glucose-Capillary: 103 mg/dL — ABNORMAL HIGH (ref 70–99)
Glucose-Capillary: 125 mg/dL — ABNORMAL HIGH (ref 70–99)
Glucose-Capillary: 121 mg/dL — ABNORMAL HIGH (ref 70–99)
Glucose-Capillary: 168 mg/dL — ABNORMAL HIGH (ref 70–99)

## 2022-08-26 NOTE — Progress Notes (Signed)
Progress Note   Patient: [name]:[id number] DOB: [birth year] DOA: 08/22/2022     3 DOS: the patient was seen and examined on 08/26/2022   Brief hospital course: In summary, patient is a 62 y.o. male with medical history significant for advanced dementia, recent MRSA bacteremia 2/2 left heel ulcer on daptomycin, CVA with residual right hemiparesis, PAD s/p stent of right popliteal, previous PE on Eliquis, hypertension, hyperlipidemia, type 2 diabetes, HFpEF, dementia who presented to the ED due to concern for sepsis. On arrival patient was febrile at 101.2, tachycardic and tachypneic meeting sepsis criteria.  No lactic acidosis.  COVID-19, influenza and RSV negative.  Chest x-ray negative.  UA with concern of UTI.Blood cultures were obtained and patient received cefepime, Flagyl and vancomycin, later continued on ceftriaxone and vancomycin.Blood cultrues thus far NGTD. Urine cultures showed mixed flora.   7/31: Vital stable, still feeling very weak.  Preliminary blood cultures negative, pending urine cultures.  8/1: Afebrile this morning but maximum temperature recorded 100.7 over the past 24 hours.  Urine culture with multiple species and blood cultures remain negative.  8/2. Found with unstagebale decubitus ulcer on exam with dark eschar. WOCN consulted and General surgery consulted to evaluate for debridement.  8/3 : Surgery recommends conservative management at this time.  Goals of care discussion with the family to be initiated.  No family by  bedside.  Continued with the current care/plan  Assessment and Plan:  Febrile illness:Source of infection likely from decubitus ulcer, unstageable. Other possible sources of infection ruled out. Patient was recently admitted for MRSA bacteremia.Urine cultures with mixed flora.Blood cultures negative Continue ceftriaxone and vancomycin pending general surgery eval and wound cultrues in possible.Discussed with Dr Everlene Farrier, no immediate plans for  debridement.Further discussion with family regarding GOC.   Advanced Dementia (HCC) withiut behavioral abnormalities Patient's family endorsed history of dementia .  He appears at his baseline. Delirium precautions. Dysphagia diet 2 as per previous SLP evaluation  History of pulmonary embolism On home Eliquis. This may be held if debridement anticipapted  Chronic diastolic CHF (congestive heart failure) (HCC) Looks compensated.Continue with GDMT  Peripheral artery disease (HCC): Continue home aspirin, statin and Eliquis  History of CVA (cerebrovascular accident) No new neurological deficits noted at this time.Continue home aspirin, statin and Eliquis  Essential hypertension: Continue home antihypertensives  Uncontrolled type 2 diabetes mellitus with hyperglycemia, without long-term current use of insulin (HCC)- Last A1c was 6.4. Continue with SSI, sensitive. Semglee 5 units at bedtime  Body mass index is 20.53 kg/m  Patient is high risk for deconditioning: PTOT to work with patient.   Palliative care consult to help with complex decision making    Subjective: Patient is a poor historian.No events reported overnight except for low grade fever of 99.30F. Sacral decubitus ulcer noted on exam  Physical Exam: Vitals:   08/25/22 1929 08/26/22 0440 08/26/22 0500 08/26/22 0800  BP: 122/69 116/70  121/68  Pulse: 78 61  72  Resp: 20 18  18   Temp: 98.3 F (36.8 C) 98.4 F (36.9 C)  98.9 F (37.2 C)  TempSrc: Oral Oral  Oral  SpO2: 100% 100%  100%  Weight:   56.1 kg   Height:       GEN: Patient was awake . Confused at baseline but appropriate in behaviour HEENT:Head atraumatic. Neck supple. No JVD CHEST: Clinically diminished bilaterally.Chest tube insitu and attached to Water seal Abdomen: Soft , NT CVS: S1,S2 wnm or gallops CNS: No focal deficits Skin: Unstageable  sacral decubitus ulcer with dark eschar+   Family Communication: No family at bedside at this  time  Disposition: Status is: Inpatient Remains inpatient appropriate because: Fever, Unstageable decub ulcer thought to be likely the source of fever  Planned Discharge Destination:  TBD    Time spent: 32 minutes  Author: Kirstie Peri, MD 08/26/2022 1:45 PM  For on call review [email or web address].

## 2022-08-26 NOTE — Plan of Care (Signed)
Wound care dressing performed. IV antibiotics given. Family has visited the patient today. The patient has not had fever during day shift. Call bell at reach.  Problem: Education: Goal: Ability to describe self-care measures that may prevent or decrease complications (Diabetes Survival Skills Education) will improve Outcome: Progressing Goal: Individualized Educational Video(s) Outcome: Progressing   Problem: Coping: Goal: Ability to adjust to condition or change in health will improve Outcome: Progressing   Problem: Fluid Volume: Goal: Ability to maintain a balanced intake and output will improve Outcome: Progressing   Problem: Health Behavior/Discharge Planning: Goal: Ability to identify and utilize available resources and services will improve Outcome: Progressing Goal: Ability to manage health-related needs will improve Outcome: Progressing   Problem: Metabolic: Goal: Ability to maintain appropriate glucose levels will improve Outcome: Progressing   Problem: Nutritional: Goal: Maintenance of adequate nutrition will improve Outcome: Progressing Goal: Progress toward achieving an optimal weight will improve Outcome: Progressing   Problem: Skin Integrity: Goal: Risk for impaired skin integrity will decrease Outcome: Progressing   Problem: Education: Goal: Knowledge of General Education information will improve Description: Including pain rating scale, medication(s)/side effects and non-pharmacologic comfort measures Outcome: Progressing   Problem: Health Behavior/Discharge Planning: Goal: Ability to manage health-related needs will improve Outcome: Progressing   Problem: Clinical Measurements: Goal: Ability to maintain clinical measurements within normal limits will improve Outcome: Progressing Goal: Will remain free from infection Outcome: Progressing Goal: Diagnostic test results will improve Outcome: Progressing Goal: Respiratory complications will  improve Outcome: Progressing Goal: Cardiovascular complication will be avoided Outcome: Progressing

## 2022-08-27 DIAGNOSIS — R509 Fever, unspecified: Secondary | ICD-10-CM | POA: Diagnosis not present

## 2022-08-27 LAB — BASIC METABOLIC PANEL
Anion gap: 10 (ref 5–15)
BUN: 12 mg/dL (ref 6–20)
CO2: 26 mmol/L (ref 22–32)
Calcium: 8.3 mg/dL — ABNORMAL LOW (ref 8.9–10.3)
Chloride: 100 mmol/L (ref 98–111)
Creatinine, Ser: 0.44 mg/dL — ABNORMAL LOW (ref 0.61–1.24)
GFR, Estimated: >60 mL/min (ref >60)
Glucose, Bld: 127 mg/dL — ABNORMAL HIGH (ref 70–99)
Potassium: 3.3 mmol/L — ABNORMAL LOW (ref 3.5–5.1)
Sodium: 136 mmol/L (ref 135–145)

## 2022-08-27 LAB — CBC
HCT: 28.5 % — ABNORMAL LOW (ref 39.0–52.0)
Hemoglobin: 9.3 g/dL — ABNORMAL LOW (ref 13.0–17.0)
MCH: 28.4 pg (ref 26.0–34.0)
MCHC: 32.6 g/dL (ref 30.0–36.0)
MCV: 87.2 fL (ref 80.0–100.0)
Platelets: 269 10*3/uL (ref 150–400)
RBC: 3.27 MIL/uL — ABNORMAL LOW (ref 4.22–5.81)
RDW: 12.6 % (ref 11.5–15.5)
WBC: 6.1 10*3/uL (ref 4.0–10.5)
nRBC: 0 % (ref 0.0–0.2)

## 2022-08-27 LAB — GLUCOSE, CAPILLARY
Glucose-Capillary: 140 mg/dL — ABNORMAL HIGH (ref 70–99)
Glucose-Capillary: 135 mg/dL — ABNORMAL HIGH (ref 70–99)
Glucose-Capillary: 159 mg/dL — ABNORMAL HIGH (ref 70–99)
Glucose-Capillary: 171 mg/dL — ABNORMAL HIGH (ref 70–99)

## 2022-08-27 LAB — MAGNESIUM: Magnesium: 2 mg/dL (ref 1.7–2.4)

## 2022-08-27 NOTE — Plan of Care (Signed)
The patient has been tolerating food diet. Q2 turns. Wound care provided to sacrum and foot. IV antibiotics given. MD notified about the patient's sister wanting to speak with the provider.  Problem: Education: Goal: Ability to describe self-care measures that may prevent or decrease complications (Diabetes Survival Skills Education) will improve Outcome: Not Progressing Goal: Individualized Educational Video(s) Outcome: Not Progressing   Problem: Coping: Goal: Ability to adjust to condition or change in health will improve Outcome: Not Progressing   Problem: Fluid Volume: Goal: Ability to maintain a balanced intake and output will improve Outcome: Not Progressing   Problem: Health Behavior/Discharge Planning: Goal: Ability to identify and utilize available resources and services will improve Outcome: Not Progressing Goal: Ability to manage health-related needs will improve Outcome: Not Progressing   Problem: Metabolic: Goal: Ability to maintain appropriate glucose levels will improve Outcome: Not Progressing   Problem: Nutritional: Goal: Maintenance of adequate nutrition will improve Outcome: Not Progressing Goal: Progress toward achieving an optimal weight will improve Outcome: Not Progressing   Problem: Skin Integrity: Goal: Risk for impaired skin integrity will decrease Outcome: Not Progressing   Problem: Tissue Perfusion: Goal: Adequacy of tissue perfusion will improve Outcome: Not Progressing   Problem: Education: Goal: Knowledge of General Education information will improve Description: Including pain rating scale, medication(s)/side effects and non-pharmacologic comfort measures Outcome: Not Progressing   Problem: Health Behavior/Discharge Planning: Goal: Ability to manage health-related needs will improve Outcome: Not Progressing   Problem: Clinical Measurements: Goal: Ability to maintain clinical measurements within normal limits will improve Outcome: Not  Progressing Goal: Will remain free from infection Outcome: Not Progressing Goal: Diagnostic test results will improve Outcome: Not Progressing Goal: Respiratory complications will improve Outcome: Not Progressing Goal: Cardiovascular complication will be avoided Outcome: Not Progressing   Problem: Activity: Goal: Risk for activity intolerance will decrease Outcome: Not Progressing   Problem: Coping: Goal: Level of anxiety will decrease Outcome: Not Progressing   Problem: Nutrition: Goal: Adequate nutrition will be maintained Outcome: Not Progressing   Problem: Elimination: Goal: Will not experience complications related to bowel motility Outcome: Not Progressing Goal: Will not experience complications related to urinary retention Outcome: Not Progressing   Problem: Safety: Goal: Ability to remain free from injury will improve Outcome: Not Progressing

## 2022-08-27 NOTE — Progress Notes (Signed)
PROGRESS NOTE    MAXIMILIANO CROMARTIE  ZOX:[id number] DOB: [birth year] DOA: 08/22/2022 PCP: Marya Fossa, PA-C  [id number]/[id number]-AA  LOS: 4 days   Brief hospital course:   Assessment & Plan: In summary, patient is a 62 y.o. male with medical history significant for advanced dementia, recent MRSA bacteremia 2/2 left heel ulcer on daptomycin, CVA with residual right hemiparesis, PAD s/p stent of right popliteal, previous PE on Eliquis, hypertension, hyperlipidemia, type 2 diabetes, HFpEF, dementia who presented to the ED due to concern for sepsis. On arrival patient was febrile at 101.2, tachycardic and tachypneic meeting sepsis criteria.  No lactic acidosis.  COVID-19, influenza and RSV negative.  Chest x-ray negative.  UA with concern of UTI.Blood cultures were obtained and patient received cefepime, Flagyl and vancomycin, later continued on ceftriaxone and vancomycin.Blood cultrues thus far NGTD. Urine cultures showed mixed flora.    7/31: Vital stable, still feeling very weak.  Preliminary blood cultures negative, pending urine cultures.   8/1: Afebrile this morning but maximum temperature recorded 100.7 over the past 24 hours.  Urine culture with multiple species and blood cultures remain negative.   8/2. Found with unstagebale decubitus ulcer on exam with dark eschar. WOCN consulted and General surgery consulted to evaluate for debridement.   8/3 : Surgery recommends conservative management at this time.  Goals of care discussion with the family to be initiated.  No family by  bedside.  Continued with the current care/plan   Assessment and Plan:   Sepsis 2/2 Unstageable sacral pressure ulcer --fever, tachycardia, with likely source of sacral pressure ulcer --started on ceftriaxone, completed 5 days --GenSurg consulted, with Dr Everlene Farrier, no plans for debridement. --wound RN consulted, wound care per order  Recent MRSA bacteremia --cont IV dapto until 08/31/22   Advanced Dementia (HCC) withiut  behavioral abnormalities He appears at his baseline. Delirium precautions. Dysphagia diet 2 as per previous SLP evaluation   History of pulmonary embolism --cont home Eliquis   Chronic diastolic CHF (congestive heart failure) (HCC) Looks compensated.   Peripheral artery disease [facility]): Continue home aspirin, statin    History of CVA (cerebrovascular accident) No new neurological deficits noted at this time. Continue home aspirin, statin    Essential hypertension:  --cont amlodipine   Uncontrolled type 2 diabetes mellitus with hyperglycemia, without long-term current use of insulin (HCC) - Last A1c was 6.4.  --cont SSI  Hypokalemia --monitor and replete PRN    DVT prophylaxis: [id number] Code Status: Full code  Family Communication: sister updated on the phone today Level of care: Telemetry Medical Dispo:   The patient is from: home Anticipated d/c is to: home Anticipated d/c date is: tomorrow   Subjective and Interval History:  Pt reported pain in his back side.  Told us that he was bed-bound at home.   Objective: Vitals:   08/26/22 1732 08/26/22 1951 08/27/22 0422 08/27/22 0816  BP: 121/70 118/67 130/80 130/72  Pulse: 73 77 78 78  Resp: 18 18 20 18   Temp: 98 F (36.7 C) 98.9 F (37.2 C) 98.3 F (36.8 C) 98 F (36.7 C)  TempSrc: Axillary Oral Oral   SpO2: 100% 100% 100% 100%  Weight:   55.5 kg   Height:        Intake/Output Summary (Last 24 hours) at 08/27/2022 1848 Last data filed at 08/27/2022 1450 Gross per 24 hour  Intake 180 ml  Output --  Net 180 ml   Filed Weights   08/24/22 0500 08/26/22 0500 08/27/22 0422  Weight: 57.7 kg 56.1 kg 55.5 kg    Examination:   Constitutional: NAD, alert, oriented to person and place HEENT: conjunctivae and lids normal, EOMI CV: No cyanosis.   RESP: normal respiratory effort, on RA Neuro: II - XII grossly intact.   Psych: subdued mood and affect.     Data Reviewed: I have personally reviewed labs and  imaging studies  Time spent: 50 minutes  Darlin Priestly, MD [facility] If 7PM-7AM, please contact night-coverage 08/27/2022, 6:48 PM

## 2022-08-28 DIAGNOSIS — R509 Fever, unspecified: Secondary | ICD-10-CM | POA: Diagnosis not present

## 2022-08-28 LAB — GLUCOSE, CAPILLARY
Glucose-Capillary: 147 mg/dL — ABNORMAL HIGH (ref 70–99)
Glucose-Capillary: 135 mg/dL — ABNORMAL HIGH (ref 70–99)

## 2022-08-28 MED ORDER — DAPTOMYCIN IV (FOR PTA / DISCHARGE USE ONLY): 500.0000 mg | INTRAVENOUS | 0 refills | Status: DC

## 2022-08-28 MED ORDER — POTASSIUM CHLORIDE CRYS ER 20 MEQ PO TBCR
40.0000 meq | EXTENDED_RELEASE_TABLET | Freq: Once | ORAL | Status: AC
  Administered 2022-08-28: 40 meq via ORAL
  Filled 2022-08-28: qty 2

## 2022-08-28 MED ORDER — MEDIHONEY WOUND/BURN DRESSING EX PSTE: 1.0000 | PASTE | Freq: Every day | CUTANEOUS | 1 refills | Status: DC

## 2022-08-28 NOTE — Progress Notes (Signed)
Pharmacy Antibiotic Note  [name] is a 62 y.o. male admitted on 08/22/2022 with history of MSA bacteremia likely from heel ulcer on daptomycin 500mg  IV q24h prior to admission, Last dose reported to be 7/29.  He was previosly on vancomycin and had levels checked 7/13 with dose adjusted from 750mg  q12h to 1gm IV q12h for a trough = 8 mcg/ml.  Pharmacy has been consulted for Vancomycin dosing - changed back to daptomycin 8/1.  Today, 08/28/2022 Renal: SCr 0.4  WBC WNL afebrile Blood cx NGTD urine cx: mx species Last dose of daptomycin was scheuled to be 08/31/22 7/31 CK 138, 8/5 103 (on atorvastatin 40mg )  Plan: Daptomycin 500mg  IV q24h until 8/8 New OPAT placed and signed to resume daptomycin as outpatient.  Dose to be given 8/5 in hospital prior to discharge. Home infusion are of planned discharge today and to resume daptomycin 8/6 as outpatient Monitor renal function and cultures CK today 103. Recheck weekly No evidence or concern for daptomycin eosinophilic pneumonia     Height: 5\' 6"  (167.6 cm) Weight: 54 kg (119 lb 0.8 oz) IBW/kg (Calculated) : 63.8  Temp (24hrs), Avg:98.8 F (37.1 C), Min:98.7 F (37.1 C), Max:98.9 F (37.2 C)  Recent Labs  Lab 08/22/22 1512 08/23/22 0415 08/25/22 0336 08/27/22 0425 08/27/22 1147 08/28/22 0351  WBC 5.7 5.8  --  6.1  --  6.9  CREATININE 0.50* 0.47* 0.45*  --  0.44* 0.40*  LATICACIDVEN 1.9  --   --   --   --   --     Estimated Creatinine Clearance: 75 mL/min (A) (by C-G formula based on SCr of 0.4 mg/dL (L)).    No Known Allergies  Antimicrobials this admission: Daptomycin proir to admit 7/30 vancomycin >> 7/30 ceftriaxone >>8/3 7/30 metronidazole x 1  Dose adjustments this admission:   Microbiology results: 7/30 BCx: NGTD  7/30 UCx:  Mx species   Thank you for allowing pharmacy to be a part of this patient's care.  Juliette Alcide, PharmD, BCPS, BCIDP Work Cell: [phone] 08/28/2022 12:17 PM

## 2022-08-28 NOTE — TOC Progression Note (Signed)
Transition of Care [facility]) - Progression Note    Patient Details  Name: [name] MRN: [id number] Date of Birth: 07/16/1960  Transition [facility] Care [facility]) CM/SW Contact  Margarito Liner, LCSW Phone Number: 08/28/2022, 9:57 AM  Clinical Narrative:  CSW will arrange EMS transport once patient finishes 11:00 antibiotic dose. [facility] and Recruitment consultant are aware of plan for discharge today. [facility] will add social worker to assist with Medicaid application and setting up personal care services. Sister is aware. She confirmed address on facesheet is correct.   Expected Discharge Plan: Home w Home Health Services Barriers to Discharge: Continued Medical Work up  Expected Discharge Plan and Services       Living arrangements for the past 2 months: Apartment Expected Discharge Date: 08/28/22                         HH Arranged: RN, PT, OT, IV Antibiotics HH Agency: [facility] Date HH Agency Contacted: 08/24/22   Representative spoke with at [facility] Agency: [facility]: Feliberto Gottron, [facility]: Jeri Modena   Social Determinants of Health (SDOH) Interventions SDOH Screenings   Food Insecurity: Patient Unable To Answer (08/23/2022)  Housing: Patient Unable To Answer (08/23/2022)  Transportation Needs: Patient Unable To Answer (08/23/2022)  Utilities: Patient Unable To Answer (08/23/2022)  Tobacco Use: High Risk (08/23/2022)    Readmission Risk Interventions     No data to display

## 2022-08-28 NOTE — Consult Note (Signed)
[facility] Customer service manager [facility]) Accountable Care Organization (ACO) [facility] Liaison Note  08/28/2022  [name] 02/11/1960 [id number]  Location: [facility] RN Hospital Liaison screened the patient remotely at [facility].  Insurance:  MCR   [name] is a 62 y.o. male who is a Primary Care Patient of Marya Fossa, [place]. The patient was screened for 30 day readmission hospitalization with noted medium risk score for unplanned readmission risk with 2 IP in 6 months.  The patient was assessed for potential [facility]) Care Management service needs for post hospital transition for care coordination. Review of patient's electronic medical record reveals patient was admitted with Sepsis. PCP not in network for care management services however RN liaison unsuccessful with outreach calls to pt, sister and the PCP office to confirmed pt being active with this provided.   [facility] Care Management/Population Health does not replace or interfere with any arrangements made by the Inpatient Transition of Care team.   For questions contact:   Elliot Cousin, RN, [facility] Liaison [place]   Population Health Office Hours MTWF  8:00 am-6:00 pm Off on Thursday [phone] mobile [phone] [Office toll free line] Office Hours are M-F 8:30 - 5 pm 24 hour nurse advise line [phone] [email or web address]  [email or web address]

## 2022-08-28 NOTE — TOC Transition Note (Signed)
Transition of Care [facility]) - CM/SW Discharge Note   Patient Details  Name: [name] MRN: [id number] Date of Birth: [birth year]  Transition of Care [facility]) CM/SW Contact:  Margarito Liner, LCSW Phone Number: 08/28/2022, 12:57 PM   Clinical Narrative:  Patient has orders to discharge home today. EMS transport has been arranged and he is 1st on the list. No further concerns. CSW signing off.   Final next level of care: Home w Home Health Services Barriers to Discharge: Barriers Resolved   Patient Goals and CMS Choice   Choice offered to / list presented to : Sibling  Discharge Placement                  Patient to be transferred to facility by: EMS Name of family member notified: [name] Patient and family notified of of transfer: 08/28/22  Discharge Plan and Services Additional resources added to the After Visit Summary for                            [facility] Arranged: RN, PT, OT, Social Work, IV Antibiotics HH Agency: [facility] Date HH Agency Contacted: 08/28/22   Representative spoke with at [facility] Agency: [facility]: Feliberto Gottron, [facility]: Jeri Modena  Social Determinants of Health (SDOH) Interventions SDOH Screenings   Food Insecurity: Patient Unable To Answer (08/23/2022)  Housing: Patient Unable To Answer (08/23/2022)  Transportation Needs: Patient Unable To Answer (08/23/2022)  Utilities: Patient Unable To Answer (08/23/2022)  Tobacco Use: High Risk (08/23/2022)     Readmission Risk Interventions     No data to display

## 2022-08-28 NOTE — Discharge Summary (Signed)
Physician Discharge Summary   [name]  male DOB: [birth year]  YQM:[id number]  PCP: Marya Fossa, PA-C  Admit date: 08/22/2022 Discharge date: 08/28/2022  Admitted From: home Disposition:  home Sister [name] (caregiver) updated on the phone prior to discharge. Home Health: Yes CODE STATUS: Full code  Discharge Instructions     Diet - low sodium heart healthy   Complete by: As directed    Discharge wound care:   Complete by: As directed    Apply Medihoney to sacrum wound every day then cover with foam dressing. Change foam dressing every 3 days or as needed when soiling. [facility] Course:  For full details, please see H&P, progress notes, consult notes and ancillary notes.  Briefly,  [name] is a 62 y.o. male with medical history significant for advanced dementia, recent MRSA bacteremia 2/2 left heel ulcer on daptomycin, CVA with residual right hemiparesis, PAD s/p stent of right popliteal, previous PE on Eliquis, hypertension, type 2 diabetes, HFpEF, who presented to the ED due to concern for sepsis.    Sepsis 2/2 Unstageable sacral pressure ulcer --fever, tachycardia, with likely source of sacral pressure ulcer --Blood cultures neg.  Urine culture showed mixed flora.  --patient started on cefepime, Flagyl and vancomycin, later continued on ceftriaxone to complete 5 days.  Fever resolved. --GenSurg consulted, with Dr Everlene Farrier, no plans for debridement. --wound RN consulted, wound care per order above.   Recent MRSA bacteremia --cont IV dapto until 08/31/22 (confirmed HHRN has been going to pt's house to administer abx).   Advanced Dementia (HCC) withiut behavioral abnormalities He appears at his baseline. Delirium precautions.  Dysphagia diet 2 as per previous SLP evaluation   History of pulmonary embolism --cont home Eliquis   Chronic diastolic CHF (congestive heart failure) (HCC) compensated.   Peripheral artery disease [facility]): Continue home  aspirin, statin    History of CVA (cerebrovascular accident) No new neurological deficits noted at this time. Continue home aspirin, statin    Essential hypertension:  --cont amlodipine   type 2 diabetes mellitus with hyperglycemia - Last A1c was 6.4.  --Has not needed long-acting insulin during this hospitalization. --resume home metformin after discharge.   Hypokalemia --monitored and repleted PRN   Unless noted above, medications under "STOP" list are ones pt was not taking PTA.  Discharge Diagnoses:  Principal Problem:   Febrile illness Active Problems:   Dementia (HCC)   Uncontrolled type 2 diabetes mellitus with hyperglycemia, without long-term current use of insulin (HCC)   Essential hypertension   History of CVA (cerebrovascular accident)   Peripheral artery disease (HCC)   Chronic diastolic CHF (congestive heart failure) (HCC)   History of pulmonary embolism   30 Day Unplanned Readmission Risk Score    Flowsheet Row ED to Hosp-Admission (Current) from 08/22/2022 in [facility]  30 Day Unplanned Readmission Risk Score (%) 19.01 Filed at 08/28/2022 0801       This score is the patient's risk of an unplanned readmission within 30 days of being discharged (0 -100%). The score is based on dignosis, age, lab data, medications, orders, and past utilization.   Low:  0-14.9   Medium: 15-21.9   High: 22-29.9   Extreme: 30 and above         Discharge Instructions:  Allergies as of 08/28/2022   No Known Allergies      Medication List     STOP taking these medications  bethanechol 10 MG tablet Commonly known as: URECHOLINE   ferrous sulfate 325 (65 FE) MG EC tablet   insulin glargine-yfgn 100 UNIT/ML injection Commonly known as: SEMGLEE   Magnesium 200 MG Tabs   melatonin 5 MG Tabs   miconazole 2 % cream Commonly known as: MICOTIN   senna 8.6 MG Tabs tablet Commonly known as: SENOKOT   Vitamin D  (Ergocalciferol) 1.25 MG (50000 UNIT) Caps capsule Commonly known as: DRISDOL       TAKE these medications    acetaminophen 325 MG tablet Commonly known as: TYLENOL Take 650 mg by mouth every 6 (six) hours as needed.   amLODipine 10 MG tablet Commonly known as: NORVASC Take 1 tablet (10 mg total) by mouth daily.   apixaban 5 MG Tabs tablet Commonly known as: ELIQUIS Take 1 tablet (5 mg total) by mouth 2 (two) times daily.   ascorbic acid 500 MG tablet Commonly known as: VITAMIN C Take 500 mg by mouth 2 (two) times daily.   aspirin EC 81 MG tablet Take 1 tablet (81 mg total) by mouth daily. Swallow whole.   atorvastatin 40 MG tablet Commonly known as: LIPITOR Take 1 tablet (40 mg total) by mouth daily at 6 PM.   daptomycin IVPB Commonly known as: CUBICIN Inject 500 mg into the vein daily for 5 days. Indication:  MRSA bacteremia First Dose: Yes Last Day of Therapy:  08/31/2022 Labs - Once weekly:  CBC/D, BMP, CPK, ESR and CRP Fax weekly lab results  promptly to [id number] Method of administration: IV Push Please pull PIC at completion of IV antibiotics Method of administration may be changed at the discretion of home infusion pharmacist based upon assessment of the patient and/or caregiver's ability to self-administer the medication ordered. Antibiotic to be delivered to home by home infusion company   finasteride 5 MG tablet Commonly known as: PROSCAR Take 5 mg by mouth daily.   leptospermum manuka honey Pste paste Apply 1 Application topically daily.   levETIRAcetam 500 MG tablet Commonly known as: KEPPRA Take 1 tablet (500 mg total) by mouth 2 (two) times daily.   metFORMIN 500 MG tablet Commonly known as: GLUCOPHAGE Take 1 tablet (500 mg total) by mouth 2 (two) times daily with a meal.   tamsulosin 0.4 MG Caps capsule Commonly known as: FLOMAX Take 1 capsule (0.4 mg total) by mouth daily after supper.               Discharge Care Instructions   (From admission, onward)           Start     Ordered   08/28/22 0000  Discharge wound care:       Comments: Apply Medihoney to sacrum wound every day then cover with foam dressing. Change foam dressing every 3 days or as needed when soiling. - -   08/28/22 8295             Follow-up Information     Marya Fossa, PA-C Follow up in 1 week(s).   Specialty: Physician Assistant Contact information: [address] [place] [address] [phone]                 No Known Allergies   The results of significant diagnostics from this hospitalization (including imaging, microbiology, ancillary and laboratory) are listed below for reference.   Consultations:   Procedures/Studies: DG Chest Port 1 View  Result Date: 08/22/2022 CLINICAL DATA:  [id number] Sepsis [facility]) [id number] EXAM: PORTABLE CHEST 1  VIEW CXR 12/30/21 FINDINGS: No pleural effusion. No pneumothorax. No focal airspace opacity. Normal cardiac and mediastinal contours. No radiographically apparent displaced rib fractures. Visualized upper abdomen is unremarkable. Prominent bilateral interstitial opacities likely represent bronchovascular crowding in the setting of low lung volumes. IMPRESSION: No focal airspace opacity. Electronically Signed   By: Lorenza Cambridge M.D.   On: 08/22/2022 16:14   [place] EKG SITE RITE  Result Date: 08/08/2022 If Site Rite image not attached, placement could not be confirmed due to current cardiac rhythm.  ECHO TEE  Result Date: 08/08/2022    TRANSESOPHOGEAL ECHO REPORT   Patient Name:   [name] Date of Exam: 08/08/2022 Medical Rec #:  [id number]        Height:       67.0 in Accession #:    [id number]       Weight:       129.0 lb Date of Birth:  Oct 22, 1960        BSA:          1.678 m Patient Age:    62 years         BP:           122/67 mmHg Patient Gender: M                HR:           66 bpm. Exam Location:  [facility] Procedure: Transesophageal Echo, Color Doppler, Cardiac  Doppler and Saline            Contrast Bubble Study Indications:     Bacteremia R78.81  History:         Patient has prior history of Echocardiogram examinations, most                  recent 08/05/2022. Stroke; Risk Factors:Hypertension and                  Diabetes.  Sonographer:     Cristela Blue Referring Phys:  [id number] SHERI HAMMOCK Diagnosing Phys: Debbe Odea MD PROCEDURE: The transesophogeal probe was passed without difficulty through the esophogus of the patient. Sedation performed by performing physician. The patient developed no complications during the procedure.  IMPRESSIONS  1. Left ventricular ejection fraction, by estimation, is 55 to 60%. The left ventricle has normal function.  2. Right ventricular systolic function is normal. The right ventricular size is normal.  3. No left atrial/left atrial appendage thrombus was detected.  4. The mitral valve is normal in structure. Trivial mitral valve regurgitation.  5. The aortic valve is tricuspid. Aortic valve regurgitation is mild to moderate. Mild aortic valve stenosis. Aortic valve mean gradient measures 10.0 mmHg. Aortic valve Vmax measures 2.06 m/s.  6. Agitated saline contrast bubble study was negative, with no evidence of any interatrial shunt. Conclusion(s)/Recommendation(s): No evidence of vegetation/infective endocarditis on this transesophageael echocardiogram. FINDINGS  Left Ventricle: Left ventricular ejection fraction, by estimation, is 55 to 60%. The left ventricle has normal function. The left ventricular internal cavity size was normal in size. Right Ventricle: The right ventricular size is normal. No increase in right ventricular wall thickness. Right ventricular systolic function is normal. Left Atrium: Left atrial size was normal in size. No left atrial/left atrial appendage thrombus was detected. Right Atrium: Right atrial size was normal in size. Pericardium: There is no evidence of pericardial effusion. Mitral Valve: The mitral  valve is normal in structure. Trivial mitral valve regurgitation. Tricuspid Valve: The tricuspid valve is normal  in structure. Tricuspid valve regurgitation is mild. Aortic Valve: The aortic valve is tricuspid. Aortic valve regurgitation is mild to moderate. Mild aortic stenosis is present. Aortic valve mean gradient measures 10.0 mmHg. Aortic valve peak gradient measures 17.0 mmHg. Pulmonic Valve: The pulmonic valve was normal in structure. Pulmonic valve regurgitation is not visualized. Aorta: The aortic root is normal in size and structure. IAS/Shunts: No atrial level shunt detected by color flow Doppler. Agitated saline contrast was given intravenously to evaluate for intracardiac shunting. Agitated saline contrast bubble study was negative, with no evidence of any interatrial shunt.  AORTIC VALVE AV Vmax:      206.00 cm/s AV Vmean:     145.000 cm/s AV VTI:       0.413 m AV Peak Grad: 17.0 mmHg AV Mean Grad: 10.0 mmHg Debbe Odea MD Electronically signed by Debbe Odea MD Signature Date/Time: 08/08/2022/10:34:07 AM    Final    ECHOCARDIOGRAM COMPLETE  Result Date: 08/05/2022    ECHOCARDIOGRAM REPORT   Patient Name:   MATISYAHU FEWKES Date of Exam: 08/05/2022 Medical Rec #:  [id number]        Height:       67.0 in Accession #:    [id number]       Weight:       129.0 lb Date of Birth:  January 03, 1961        BSA:          1.678 m Patient Age:    62 years         BP:           135/72 mmHg Patient Gender: M                HR:           73 bpm. Exam Location:  [facility] Procedure: 2D Echo Indications:     Bacteremia R78.81  History:         Patient has prior history of Echocardiogram examinations, most                  recent 04/26/2021.  Sonographer:     Overton Mam RDCS, FASE Referring Phys:  [id number] Lynn Ito Diagnosing Phys: Chilton Si MD IMPRESSIONS  1. Left ventricular ejection fraction, by estimation, is 55 to 60%. The left ventricle has normal function. The left ventricle has no  regional wall motion abnormalities. There is mild concentric left ventricular hypertrophy. Left ventricular diastolic parameters are consistent with Grade I diastolic dysfunction (impaired relaxation).  2. Right ventricular systolic function is normal. The right ventricular size is normal.  3. The mitral valve is normal in structure. Mild mitral valve regurgitation. No evidence of mitral stenosis.  4. The aortic valve is tricuspid. There is moderate calcification of the aortic valve. There is moderate thickening of the aortic valve. Aortic valve regurgitation is mild to moderate. Mild aortic valve stenosis. Aortic regurgitation PHT measures 415 msec. Aortic valve area, by VTI measures 2.11 cm. Aortic valve mean gradient measures 11.0 mmHg. Aortic valve Vmax measures 2.22 m/s. FINDINGS  Left Ventricle: Left ventricular ejection fraction, by estimation, is 55 to 60%. The left ventricle has normal function. The left ventricle has no regional wall motion abnormalities. The left ventricular internal cavity size was normal in size. There is  mild concentric left ventricular hypertrophy. Left ventricular diastolic parameters are consistent with Grade I diastolic dysfunction (impaired relaxation). Normal left ventricular filling pressure. Right Ventricle: The right ventricular size is normal. No increase in right  ventricular wall thickness. Right ventricular systolic function is normal. Left Atrium: Left atrial size was normal in size. Right Atrium: Right atrial size was normal in size. Pericardium: There is no evidence of pericardial effusion. Mitral Valve: The mitral valve is normal in structure. Mild mitral valve regurgitation. No evidence of mitral valve stenosis. Tricuspid Valve: The tricuspid valve is normal in structure. Tricuspid valve regurgitation is trivial. No evidence of tricuspid stenosis. Aortic Valve: The aortic valve is tricuspid. There is moderate calcification of the aortic valve. There is moderate  thickening of the aortic valve. Aortic valve regurgitation is mild to moderate. Aortic regurgitation PHT measures 415 msec. Mild aortic stenosis is present. Aortic valve mean gradient measures 11.0 mmHg. Aortic valve peak gradient measures 19.7 mmHg. Aortic valve area, by VTI measures 2.11 cm. Pulmonic Valve: The pulmonic valve was normal in structure. Pulmonic valve regurgitation is not visualized. No evidence of pulmonic stenosis. Aorta: The aortic root is normal in size and structure. Venous: The inferior vena cava was not well visualized. IAS/Shunts: No atrial level shunt detected by color flow Doppler.  LEFT VENTRICLE PLAX 2D LVIDd:         4.40 cm   Diastology LVIDs:         3.10 cm   LV e' medial:    10.10 cm/s LV PW:         1.10 cm   LV E/e' medial:  7.8 LV IVS:        1.20 cm   LV e' lateral:   12.90 cm/s LVOT diam:     2.10 cm   LV E/e' lateral: 6.1 LV SV:         86 LV SV Index:   51 LVOT Area:     3.46 cm  RIGHT VENTRICLE RV Basal diam:  2.00 cm RV S prime:     15.70 cm/s TAPSE (M-mode): 2.2 cm LEFT ATRIUM             Index        RIGHT ATRIUM          Index LA diam:        3.00 cm 1.79 cm/m   RA Area:     9.99 cm LA Vol (A2C):   48.9 ml 29.14 ml/m  RA Volume:   20.10 ml 11.98 ml/m LA Vol (A4C):   29.7 ml 17.70 ml/m LA Biplane Vol: 37.8 ml 22.52 ml/m  AORTIC VALVE                     PULMONIC VALVE AV Area (Vmax):    1.89 cm      PV Vmax:       1.17 m/s AV Area (Vmean):   1.76 cm      PV Peak grad:  5.5 mmHg AV Area (VTI):     2.11 cm AV Vmax:           222.00 cm/s AV Vmean:          153.000 cm/s AV VTI:            0.409 m AV Peak Grad:      19.7 mmHg AV Mean Grad:      11.0 mmHg LVOT Vmax:         121.00 cm/s LVOT Vmean:        77.800 cm/s LVOT VTI:          0.249 m LVOT/AV VTI ratio: 0.61 AI PHT:  415 msec  AORTA Ao Root diam: 3.50 cm Ao Asc diam:  3.50 cm MITRAL VALVE               TRICUSPID VALVE MV Area (PHT): 3.16 cm    TR Peak grad:   27.0 mmHg MV Decel Time: 240 msec    TR  Vmax:        260.00 cm/s MV E velocity: 79.10 cm/s MV A velocity: 91.20 cm/s  SHUNTS MV E/A ratio:  0.87        Systemic VTI:  0.25 m                            Systemic Diam: 2.10 cm Chilton Si MD Electronically signed by Chilton Si MD Signature Date/Time: 08/05/2022/2:57:06 PM    Final    DG Foot Complete Left  Result Date: 08/02/2022 CLINICAL DATA:  Skin wound in the heel EXAM: LEFT FOOT - COMPLETE 3+ VIEW COMPARISON:  None Available. FINDINGS: No displaced fracture or dislocation is seen. Osteopenia is seen involving structures. There are no focal lytic lesions. Arterial calcifications are seen in soft tissues. IMPRESSION: No fracture or dislocation is seen. Osteopenia. There are no focal lytic lesions. If there is clinical suspicion for osteomyelitis, follow-up MRI may be considered. 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 Electronically Signed   By: Ernie Avena M.D.   On: 08/02/2022 17:53   DG Chest 2 View  Result Date: 08/02/2022 CLINICAL DATA:  Fever, altered mental status EXAM: CHEST - 2 VIEW COMPARISON:  12/30/2021 FINDINGS: Low lung volumes. Patchy airspace disease in the left mid and lower lung. Right lung clear. No effusions. Heart mediastinal contours within normal limits. No acute bony abnormality. IMPRESSION: Patchy left lung airspace disease concerning for pneumonia. Low lung volumes. Electronically Signed   By: Charlett Nose M.D.   On: 08/02/2022 17:52      Labs: BNP (last 3 results) No results for input(s): "BNP" in the last 8760 hours. Basic Metabolic Panel: Recent Labs  Lab 08/22/22 1512 08/23/22 0415 08/25/22 0336 08/27/22 1147 08/28/22 0351  NA 134* 135  --  136 134*  K 3.4* 3.3*  --  3.3* 3.4*  CL 101 103  --  100 99  CO2 24 24  --  26 26  GLUCOSE 108* 78  --  127* 133*  BUN 16 17  --  12 10  CREATININE 0.50* 0.47* 0.45* 0.44* 0.40*  CALCIUM 8.3* 8.3*  --  8.3* 8.5*  MG  --   --   --  2.0 2.0   Liver Function  Tests: Recent Labs  Lab 08/22/22 1512  AST 18  ALT 12  ALKPHOS 64  BILITOT 0.8  PROT 8.3*  ALBUMIN 2.9*   No results for input(s): "LIPASE", "AMYLASE" in the last 168 hours. No results for input(s): "AMMONIA" in the last 168 hours. CBC: Recent Labs  Lab 08/22/22 1512 08/23/22 0415 08/27/22 0425 08/28/22 0351  WBC 5.7 5.8 6.1 6.9  NEUTROABS 4.8 4.4  --   --   HGB 10.6* 9.4* 9.3* 10.1*  HCT 31.8* 28.9* 28.5* 29.8*  MCV 86.9 89.2 87.2 87.1  PLT 241 215 269 290   Cardiac Enzymes: Recent Labs  Lab 08/23/22 0415 08/28/22 0351  CKTOTAL 138 103   BNP: Invalid input(s): "POCBNP" CBG: Recent Labs  Lab 08/27/22 0819 08/27/22 1131 08/27/22 1807 08/27/22 2136 08/28/22 0759  GLUCAP 140* 135* 159* 171* 147*  D-Dimer No results for input(s): "DDIMER" in the last 72 hours. Hgb A1c No results for input(s): "HGBA1C" in the last 72 hours. Lipid Profile No results for input(s): "CHOL", "HDL", "LDLCALC", "TRIG", "CHOLHDL", "LDLDIRECT" in the last 72 hours. Thyroid function studies No results for input(s): "TSH", "T4TOTAL", "T3FREE", "THYROIDAB" in the last 72 hours.  Invalid input(s): "FREET3" Anemia work up No results for input(s): "VITAMINB12", "FOLATE", "FERRITIN", "TIBC", "IRON", "RETICCTPCT" in the last 72 hours. Urinalysis    Component Value Date/Time   COLORURINE YELLOW (A) 08/22/2022 2331   APPEARANCEUR CLOUDY (A) 08/22/2022 2331   APPEARANCEUR Clear 12/29/2013 1214   LABSPEC 1.019 08/22/2022 2331   LABSPEC 1.017 12/29/2013 1214   PHURINE 5.0 08/22/2022 2331   GLUCOSEU NEGATIVE 08/22/2022 2331   GLUCOSEU >=500 12/29/2013 1214   HGBUR SMALL (A) 08/22/2022 2331   BILIRUBINUR NEGATIVE 08/22/2022 2331   BILIRUBINUR Negative 12/29/2013 1214   KETONESUR NEGATIVE 08/22/2022 2331   PROTEINUR 30 (A) 08/22/2022 2331   NITRITE POSITIVE (A) 08/22/2022 2331   LEUKOCYTESUR LARGE (A) 08/22/2022 2331   LEUKOCYTESUR Negative 12/29/2013 1214   Sepsis Labs Recent Labs   Lab 08/22/22 1512 08/23/22 0415 08/27/22 0425 08/28/22 0351  WBC 5.7 5.8 6.1 6.9   Microbiology Recent Results (from the past 240 hour(s))  Culture, blood (Routine x 2)     Status: None   Collection Time: 08/22/22  3:12 PM   Specimen: BLOOD  Result Value Ref Range Status   Specimen Description BLOOD BLOOD RIGHT ARM  Final   Special Requests   Final    BOTTLES DRAWN AEROBIC AND ANAEROBIC Blood Culture adequate volume   Culture   Final    NO GROWTH 5 DAYS Performed at [facility], [address] [place], [place] [address]    Report Status 08/27/2022 FINAL  Final  Culture, blood (Routine x 2)     Status: None   Collection Time: 08/22/22  3:12 PM   Specimen: BLOOD  Result Value Ref Range Status   Specimen Description BLOOD BLOOD LEFT ARM  Final   Special Requests   Final    BOTTLES DRAWN AEROBIC AND ANAEROBIC Blood Culture adequate volume   Culture   Final    NO GROWTH 5 DAYS Performed at [facility], [address] [place], [place] [address]    Report Status 08/27/2022 FINAL  Final  Resp panel by RT-PCR (RSV, Flu A&B, Covid) Anterior Nasal Swab     Status: None   Collection Time: 08/22/22  3:12 PM   Specimen: Anterior Nasal Swab  Result Value Ref Range Status   SARS Coronavirus 2 by RT PCR NEGATIVE NEGATIVE Final    Comment: (NOTE) SARS-CoV-2 target nucleic acids are NOT DETECTED.  The SARS-CoV-2 RNA is generally detectable in upper respiratory specimens during the acute phase of infection. The lowest concentration of SARS-CoV-2 viral copies this assay can detect is 138 copies/mL. A negative result does not preclude SARS-Cov-2 infection and should not be used as the sole basis for treatment or other patient management decisions. A negative result may occur with  improper specimen collection/handling, submission of specimen other than nasopharyngeal swab, presence of viral mutation(s) within the areas targeted by this assay, and inadequate number  of viral copies(<138 copies/mL). A negative result must be combined with clinical observations, patient history, and epidemiological information. The expected result is Negative.  Fact Sheet for Patients:  BloggerCourse.com  Fact Sheet for Healthcare Providers:  SeriousBroker.it  This test is no t yet approved  or cleared by the [place] and  has been authorized for detection and/or diagnosis of SARS-CoV-2 by FDA under an Emergency Use Authorization (EUA). This EUA will remain  in effect (meaning this test can be used) for the duration of the COVID-19 declaration under Section 564(b)(1) of the Act, 21 U.S.C.section 360bbb-3(b)(1), unless the authorization is terminated  or revoked sooner.       Influenza A by PCR NEGATIVE NEGATIVE Final   Influenza B by PCR NEGATIVE NEGATIVE Final    Comment: (NOTE) The Xpert Xpress SARS-CoV-2/FLU/RSV plus assay is intended as an aid in the diagnosis of influenza from Nasopharyngeal swab specimens and should not be used as a sole basis for treatment. Nasal washings and aspirates are unacceptable for Xpert Xpress SARS-CoV-2/FLU/RSV testing.  Fact Sheet for Patients: BloggerCourse.com  Fact Sheet for Healthcare Providers: SeriousBroker.it  This test is not yet approved or cleared by the [place] FDA and has been authorized for detection and/or diagnosis of SARS-CoV-2 by FDA under an Emergency Use Authorization (EUA). This EUA will remain in effect (meaning this test can be used) for the duration of the COVID-19 declaration under Section 564(b)(1) of the Act, 21 U.S.C. section 360bbb-3(b)(1), unless the authorization is terminated or revoked.     Resp Syncytial Virus by PCR NEGATIVE NEGATIVE Final    Comment: (NOTE) Fact Sheet for Patients: BloggerCourse.com  Fact Sheet for Healthcare  Providers: SeriousBroker.it  This test is not yet approved or cleared by the [place] FDA and has been authorized for detection and/or diagnosis of SARS-CoV-2 by FDA under an Emergency Use Authorization (EUA). This EUA will remain in effect (meaning this test can be used) for the duration of the COVID-19 declaration under Section 564(b)(1) of the Act, 21 U.S.C. section 360bbb-3(b)(1), unless the authorization is terminated or revoked.  Performed at [facility], [address] [place], [place] [address]   Blood culture (single)     Status: None   Collection Time: 08/22/22  7:36 PM   Specimen: BLOOD  Result Value Ref Range Status   Specimen Description BLOOD BLOOD RIGHT ARM  Final   Special Requests   Final    BOTTLES DRAWN AEROBIC AND ANAEROBIC Blood Culture adequate volume   Culture   Final    NO GROWTH 5 DAYS Performed at [facility], [address] [place], [place] [address]    Report Status 08/27/2022 FINAL  Final  Urine Culture     Status: Abnormal   Collection Time: 08/22/22 11:31 PM   Specimen: Urine, Random  Result Value Ref Range Status   Specimen Description   Final    URINE, RANDOM Performed at [facility], [address], [place], [place] [address]    Special Requests   Final    NONE Reflexed from [id number] Performed at [facility], [address] [place], [place] [address]    Culture MULTIPLE SPECIES PRESENT, SUGGEST RECOLLECTION (A)  Final   Report Status 08/24/2022 FINAL  Final     Total time spend on discharging this patient, including the last patient exam, discussing the hospital stay, instructions for ongoing care as it relates to all pertinent caregivers, as well as preparing the medical discharge records, prescriptions, and/or referrals as applicable, is 45 minutes.    Darlin Priestly, MD  [facility] 08/28/2022, 8:28 AM

## 2022-08-28 NOTE — Progress Notes (Signed)
Theresa Duty to be D/C'd Home per MD order.  Discussed prescriptions and follow up appointments with the patient. Prescriptions given to patient, medication list explained in detail. Pt verbalized understanding.  Allergies as of 08/28/2022   No Known Allergies      Medication List     STOP taking these medications    bethanechol 10 MG tablet Commonly known as: URECHOLINE   ferrous sulfate 325 (65 FE) MG EC tablet   insulin glargine-yfgn 100 UNIT/ML injection Commonly known as: SEMGLEE   Magnesium 200 MG Tabs   melatonin 5 MG Tabs   miconazole 2 % cream Commonly known as: MICOTIN   senna 8.6 MG Tabs tablet Commonly known as: SENOKOT   Vitamin D (Ergocalciferol) 1.25 MG (50000 UNIT) Caps capsule Commonly known as: DRISDOL       TAKE these medications    acetaminophen 325 MG tablet Commonly known as: TYLENOL Take 650 mg by mouth every 6 (six) hours as needed.   amLODipine 10 MG tablet Commonly known as: NORVASC Take 1 tablet (10 mg total) by mouth daily.   apixaban 5 MG Tabs tablet Commonly known as: ELIQUIS Take 1 tablet (5 mg total) by mouth 2 (two) times daily.   ascorbic acid 500 MG tablet Commonly known as: VITAMIN C Take 500 mg by mouth 2 (two) times daily.   aspirin EC 81 MG tablet Take 1 tablet (81 mg total) by mouth daily. Swallow whole.   atorvastatin 40 MG tablet Commonly known as: LIPITOR Take 1 tablet (40 mg total) by mouth daily at 6 PM.   daptomycin IVPB Commonly known as: CUBICIN Inject 500 mg into the vein daily for 5 days. Indication:  MRSA bacteremia First Dose: Yes Last Day of Therapy:  08/31/2022 Labs - Once weekly:  CBC/D, BMP, CPK, ESR and CRP Fax weekly lab results  promptly to [phone] Method of administration: IV Push Please pull PIC at completion of IV antibiotics Method of administration may be changed at the discretion of home infusion pharmacist based upon assessment of the patient and/or caregiver's ability to  self-administer the medication ordered. Antibiotic to be delivered to home by home infusion company   finasteride 5 MG tablet Commonly known as: PROSCAR Take 5 mg by mouth daily.   leptospermum manuka honey Pste paste Apply 1 Application topically daily.   levETIRAcetam 500 MG tablet Commonly known as: KEPPRA Take 1 tablet (500 mg total) by mouth 2 (two) times daily.   metFORMIN 500 MG tablet Commonly known as: GLUCOPHAGE Take 1 tablet (500 mg total) by mouth 2 (two) times daily with a meal.   tamsulosin 0.4 MG Caps capsule Commonly known as: FLOMAX Take 1 capsule (0.4 mg total) by mouth daily after supper.               Discharge Care Instructions  (From admission, onward)           Start     Ordered   08/28/22 0000  Discharge wound care:       Comments: Apply Medihoney to sacrum wound every day then cover with foam dressing. Change foam dressing every 3 days or as needed when soiling. - -   08/28/22 0826            Vitals:   08/28/22 0530 08/28/22 0815  BP: (!) 140/93 134/79  Pulse: 88 86  Resp: 18 18  Temp: 98.7 F (37.1 C) 98.8 F (37.1 C)  SpO2: 100% 100%    Skin  clean, dry and intact without evidence of skin break down, no evidence of skin tears noted. IV catheter discontinued intact. Site without signs and symptoms of complications. Dressing and pressure applied. Pt denies pain at this time. No complaints noted.  An After Visit Summary was printed and given to the patient. D/C home via EMS.   [name]

## 2022-08-28 NOTE — Care Management Important Message (Signed)
Important Message  Patient Details  Name: [name] MRN: [id number] Date of Birth: 1960-05-12   Medicare Important Message Given:  Yes  Reviewed Medicare IM with Gerrit Halls, sister, at [phone].  Copy of Medicare IM mailed to home address on file.     Johnell Comings 08/28/2022, 9:17 AM

## 2022-08-31 ENCOUNTER — Emergency Department: Payer: Medicare Other

## 2022-08-31 ENCOUNTER — Inpatient Hospital Stay
Admission: EM | Admit: 2022-08-31 | Discharge: 2022-09-02 | DRG: 871 | Disposition: A | Discharge status: Home Health | Payer: Medicare Other | Attending: Internal Medicine | Admitting: Internal Medicine

## 2022-08-31 ENCOUNTER — Other Ambulatory Visit: Payer: Self-pay

## 2022-08-31 DIAGNOSIS — I10 Essential (primary) hypertension: Secondary | ICD-10-CM

## 2022-08-31 DIAGNOSIS — I69351 Hemiplegia and hemiparesis following cerebral infarction affecting right dominant side: Secondary | ICD-10-CM

## 2022-08-31 DIAGNOSIS — I5032 Chronic diastolic (congestive) heart failure: Secondary | ICD-10-CM | POA: Diagnosis not present

## 2022-08-31 DIAGNOSIS — F0390 Unspecified dementia without behavioral disturbance: Secondary | ICD-10-CM | POA: Diagnosis present

## 2022-08-31 DIAGNOSIS — G40909 Epilepsy, unspecified, not intractable, without status epilepticus: Secondary | ICD-10-CM | POA: Diagnosis not present

## 2022-08-31 DIAGNOSIS — E43 Unspecified severe protein-calorie malnutrition: Secondary | ICD-10-CM | POA: Diagnosis not present

## 2022-08-31 DIAGNOSIS — Z833 Family history of diabetes mellitus: Secondary | ICD-10-CM

## 2022-08-31 DIAGNOSIS — E785 Hyperlipidemia, unspecified: Secondary | ICD-10-CM | POA: Diagnosis present

## 2022-08-31 DIAGNOSIS — Z8673 Personal history of transient ischemic attack (TIA), and cerebral infarction without residual deficits: Secondary | ICD-10-CM

## 2022-08-31 DIAGNOSIS — Z7982 Long term (current) use of aspirin: Secondary | ICD-10-CM

## 2022-08-31 DIAGNOSIS — B9562 Methicillin resistant Staphylococcus aureus infection as the cause of diseases classified elsewhere: Secondary | ICD-10-CM | POA: Diagnosis present

## 2022-08-31 DIAGNOSIS — E876 Hypokalemia: Secondary | ICD-10-CM | POA: Diagnosis present

## 2022-08-31 DIAGNOSIS — Z7984 Long term (current) use of oral hypoglycemic drugs: Secondary | ICD-10-CM | POA: Diagnosis not present

## 2022-08-31 DIAGNOSIS — Z7901 Long term (current) use of anticoagulants: Secondary | ICD-10-CM | POA: Diagnosis not present

## 2022-08-31 DIAGNOSIS — I110 Hypertensive heart disease with heart failure: Secondary | ICD-10-CM | POA: Diagnosis present

## 2022-08-31 DIAGNOSIS — J189 Pneumonia, unspecified organism: Secondary | ICD-10-CM | POA: Diagnosis not present

## 2022-08-31 DIAGNOSIS — Z89611 Acquired absence of right leg above knee: Secondary | ICD-10-CM | POA: Diagnosis not present

## 2022-08-31 DIAGNOSIS — G629 Polyneuropathy, unspecified: Secondary | ICD-10-CM

## 2022-08-31 DIAGNOSIS — Z8614 Personal history of Methicillin resistant Staphylococcus aureus infection: Secondary | ICD-10-CM | POA: Diagnosis not present

## 2022-08-31 DIAGNOSIS — L89623 Pressure ulcer of left heel, stage 3: Secondary | ICD-10-CM

## 2022-08-31 DIAGNOSIS — E1165 Type 2 diabetes mellitus with hyperglycemia: Secondary | ICD-10-CM | POA: Diagnosis not present

## 2022-08-31 DIAGNOSIS — R509 Fever, unspecified: Secondary | ICD-10-CM

## 2022-08-31 DIAGNOSIS — F17210 Nicotine dependence, cigarettes, uncomplicated: Secondary | ICD-10-CM | POA: Diagnosis present

## 2022-08-31 DIAGNOSIS — A419 Sepsis, unspecified organism: Principal | ICD-10-CM | POA: Diagnosis present

## 2022-08-31 DIAGNOSIS — Z86711 Personal history of pulmonary embolism: Secondary | ICD-10-CM | POA: Diagnosis not present

## 2022-08-31 DIAGNOSIS — Z681 Body mass index (BMI) 19 or less, adult: Secondary | ICD-10-CM | POA: Diagnosis not present

## 2022-08-31 DIAGNOSIS — L89150 Pressure ulcer of sacral region, unstageable: Secondary | ICD-10-CM | POA: Diagnosis present

## 2022-08-31 DIAGNOSIS — Z1152 Encounter for screening for COVID-19: Secondary | ICD-10-CM

## 2022-08-31 DIAGNOSIS — Z79899 Other long term (current) drug therapy: Secondary | ICD-10-CM | POA: Diagnosis not present

## 2022-08-31 DIAGNOSIS — Z8701 Personal history of pneumonia (recurrent): Secondary | ICD-10-CM

## 2022-08-31 DIAGNOSIS — N400 Enlarged prostate without lower urinary tract symptoms: Secondary | ICD-10-CM | POA: Diagnosis not present

## 2022-08-31 DIAGNOSIS — M868X7 Other osteomyelitis, ankle and foot: Secondary | ICD-10-CM

## 2022-08-31 DIAGNOSIS — E1151 Type 2 diabetes mellitus with diabetic peripheral angiopathy without gangrene: Secondary | ICD-10-CM | POA: Diagnosis present

## 2022-08-31 DIAGNOSIS — L8990 Pressure ulcer of unspecified site, unspecified stage: Secondary | ICD-10-CM | POA: Diagnosis present

## 2022-08-31 LAB — URINALYSIS, W/ REFLEX TO CULTURE (INFECTION SUSPECTED)
Bacteria, UA: NONE SEEN
Bilirubin Urine: NEGATIVE
Glucose, UA: 50 mg/dL — AB
Ketones, ur: NEGATIVE mg/dL
Nitrite: NEGATIVE
Protein, ur: 30 mg/dL — AB
Specific Gravity, Urine: 1.02 (ref 1.005–1.030)
pH: 5 (ref 5.0–8.0)

## 2022-08-31 LAB — CBC WITH DIFFERENTIAL/PLATELET
Abs Immature Granulocytes: 0.06 10*3/uL (ref 0.00–0.07)
Basophils Absolute: 0 10*3/uL (ref 0.0–0.1)
Basophils Relative: 0 %
Eosinophils Absolute: 0 10*3/uL (ref 0.0–0.5)
Eosinophils Relative: 0 %
HCT: 30.8 % — ABNORMAL LOW (ref 39.0–52.0)
Hemoglobin: 10 g/dL — ABNORMAL LOW (ref 13.0–17.0)
Immature Granulocytes: 0 %
Lymphocytes Relative: 5 %
Lymphs Abs: 0.8 10*3/uL (ref 0.7–4.0)
MCH: 28.6 pg (ref 26.0–34.0)
MCHC: 32.5 g/dL (ref 30.0–36.0)
MCV: 88 fL (ref 80.0–100.0)
Monocytes Absolute: 0.5 10*3/uL (ref 0.1–1.0)
Monocytes Relative: 4 %
Neutro Abs: 13 10*3/uL — ABNORMAL HIGH (ref 1.7–7.7)
Neutrophils Relative %: 91 %
Platelets: 332 10*3/uL (ref 150–400)
RBC: 3.5 MIL/uL — ABNORMAL LOW (ref 4.22–5.81)
RDW: 13.7 % (ref 11.5–15.5)
WBC: 14.4 10*3/uL — ABNORMAL HIGH (ref 4.0–10.5)
nRBC: 0 % (ref 0.0–0.2)

## 2022-08-31 LAB — COMPREHENSIVE METABOLIC PANEL
ALT: 20 U/L (ref 0–44)
AST: 27 U/L (ref 15–41)
Albumin: 2.6 g/dL — ABNORMAL LOW (ref 3.5–5.0)
Alkaline Phosphatase: 63 U/L (ref 38–126)
Anion gap: 10 (ref 5–15)
BUN: 43 mg/dL — ABNORMAL HIGH (ref 6–20)
CO2: 24 mmol/L (ref 22–32)
Calcium: 8.2 mg/dL — ABNORMAL LOW (ref 8.9–10.3)
Chloride: 103 mmol/L (ref 98–111)
Creatinine, Ser: 0.74 mg/dL (ref 0.61–1.24)
GFR, Estimated: >60 mL/min (ref >60)
Glucose, Bld: 291 mg/dL — ABNORMAL HIGH (ref 70–99)
Potassium: 3.6 mmol/L (ref 3.5–5.1)
Sodium: 137 mmol/L (ref 135–145)
Total Bilirubin: 0.6 mg/dL (ref 0.3–1.2)
Total Protein: 8.3 g/dL — ABNORMAL HIGH (ref 6.5–8.1)

## 2022-08-31 LAB — LACTIC ACID, PLASMA
Lactic Acid, Venous: 1.7 mmol/L (ref 0.5–1.9)
Lactic Acid, Venous: 1.9 mmol/L (ref 0.5–1.9)

## 2022-08-31 LAB — PROTIME-INR
INR: 1.9 — ABNORMAL HIGH (ref 0.8–1.2)
Prothrombin Time: 22.3 seconds — ABNORMAL HIGH (ref 11.4–15.2)

## 2022-08-31 LAB — GLUCOSE, CAPILLARY: Glucose-Capillary: 129 mg/dL — ABNORMAL HIGH (ref 70–99)

## 2022-08-31 LAB — APTT: aPTT: 51 seconds — ABNORMAL HIGH (ref 24–36)

## 2022-08-31 LAB — MRSA NEXT GEN BY PCR, NASAL: MRSA by PCR Next Gen: NOT DETECTED

## 2022-08-31 LAB — PROCALCITONIN: Procalcitonin: 0.43 ng/mL

## 2022-08-31 LAB — STREP PNEUMONIAE URINARY ANTIGEN: Strep Pneumo Urinary Antigen: NEGATIVE

## 2022-08-31 LAB — RESP PANEL BY RT-PCR (RSV, FLU A&B, COVID)  RVPGX2
Influenza A by PCR: NEGATIVE
Influenza B by PCR: NEGATIVE
Resp Syncytial Virus by PCR: NEGATIVE
SARS Coronavirus 2 by RT PCR: NEGATIVE

## 2022-08-31 LAB — CBG MONITORING, ED: Glucose-Capillary: 290 mg/dL — ABNORMAL HIGH (ref 70–99)

## 2022-08-31 MED ORDER — SODIUM CHLORIDE 0.9 % IV SOLN
2.0000 g | Freq: Three times a day (TID) | INTRAVENOUS | Status: DC
  Administered 2022-08-31 – 2022-09-01 (×2): 2 g via INTRAVENOUS
  Filled 2022-08-31 (×3): qty 12.5

## 2022-08-31 MED ORDER — SODIUM CHLORIDE 0.9% FLUSH
3.0000 mL | Freq: Two times a day (BID) | INTRAVENOUS | Status: DC
  Administered 2022-08-31 – 2022-09-02 (×4): 3 mL via INTRAVENOUS

## 2022-08-31 MED ORDER — TAMSULOSIN HCL 0.4 MG PO CAPS
0.4000 mg | ORAL_CAPSULE | Freq: Every day | ORAL | Status: DC
  Administered 2022-08-31 – 2022-09-01 (×2): 0.4 mg via ORAL
  Filled 2022-08-31 (×2): qty 1

## 2022-08-31 MED ORDER — APIXABAN 5 MG PO TABS
5.0000 mg | ORAL_TABLET | Freq: Two times a day (BID) | ORAL | Status: DC
  Administered 2022-08-31 – 2022-09-02 (×4): 5 mg via ORAL
  Filled 2022-08-31 (×4): qty 1

## 2022-08-31 MED ORDER — ACETAMINOPHEN 650 MG RE SUPP: 650.0000 mg | Freq: Four times a day (QID) | RECTAL | Status: DC | PRN

## 2022-08-31 MED ORDER — FINASTERIDE 5 MG PO TABS
5.0000 mg | ORAL_TABLET | Freq: Every day | ORAL | Status: DC
  Administered 2022-09-01 – 2022-09-02 (×2): 5 mg via ORAL
  Filled 2022-08-31 (×2): qty 1

## 2022-08-31 MED ORDER — SODIUM CHLORIDE 0.9 % IV BOLUS (SEPSIS)
1000.0000 mL | Freq: Once | INTRAVENOUS | Status: AC
  Administered 2022-08-31: 1000 mL via INTRAVENOUS

## 2022-08-31 MED ORDER — LEVETIRACETAM 500 MG PO TABS
500.0000 mg | ORAL_TABLET | Freq: Two times a day (BID) | ORAL | Status: DC
  Administered 2022-08-31 – 2022-09-02 (×4): 500 mg via ORAL
  Filled 2022-08-31 (×4): qty 1

## 2022-08-31 MED ORDER — INSULIN ASPART 100 UNIT/ML IJ SOLN
0.0000 [IU] | Freq: Three times a day (TID) | INTRAMUSCULAR | Status: DC
  Administered 2022-08-31: 9 [IU] via SUBCUTANEOUS
  Administered 2022-09-01: 2 [IU] via SUBCUTANEOUS
  Administered 2022-09-01: 3 [IU] via SUBCUTANEOUS
  Filled 2022-08-31 (×3): qty 1

## 2022-08-31 MED ORDER — VANCOMYCIN HCL IN DEXTROSE 1-5 GM/200ML-% IV SOLN
1000.0000 mg | Freq: Once | INTRAVENOUS | Status: AC
  Administered 2022-08-31: 1000 mg via INTRAVENOUS
  Filled 2022-08-31: qty 200

## 2022-08-31 MED ORDER — SODIUM CHLORIDE 0.9 % IV SOLN
500.0000 mg | INTRAVENOUS | Status: DC
  Administered 2022-08-31: 500 mg via INTRAVENOUS
  Filled 2022-08-31: qty 5

## 2022-08-31 MED ORDER — ONDANSETRON HCL 4 MG/2ML IJ SOLN: 4.0000 mg | Freq: Four times a day (QID) | INTRAMUSCULAR | Status: DC | PRN

## 2022-08-31 MED ORDER — LACTATED RINGERS IV SOLN: INTRAVENOUS | Status: DC

## 2022-08-31 MED ORDER — POLYETHYLENE GLYCOL 3350 17 G PO PACK: 17.0000 g | PACK | Freq: Every day | ORAL | Status: DC | PRN

## 2022-08-31 MED ORDER — ASPIRIN 81 MG PO TBEC
81.0000 mg | DELAYED_RELEASE_TABLET | Freq: Every day | ORAL | Status: DC
  Administered 2022-09-01 – 2022-09-02 (×2): 81 mg via ORAL
  Filled 2022-08-31 (×2): qty 1

## 2022-08-31 MED ORDER — ACETAMINOPHEN 325 MG PO TABS: 650.0000 mg | ORAL_TABLET | Freq: Four times a day (QID) | ORAL | Status: DC | PRN

## 2022-08-31 MED ORDER — SODIUM CHLORIDE 0.9 % IV SOLN
2.0000 g | Freq: Once | INTRAVENOUS | Status: AC
  Administered 2022-08-31: 2 g via INTRAVENOUS
  Filled 2022-08-31: qty 12.5

## 2022-08-31 MED ORDER — ONDANSETRON HCL 4 MG PO TABS: 4.0000 mg | ORAL_TABLET | Freq: Four times a day (QID) | ORAL | Status: DC | PRN

## 2022-08-31 MED ORDER — METRONIDAZOLE 500 MG/100ML IV SOLN
500.0000 mg | Freq: Once | INTRAVENOUS | Status: AC
  Administered 2022-08-31: 500 mg via INTRAVENOUS
  Filled 2022-08-31: qty 100

## 2022-08-31 MED ORDER — VITAMIN C 500 MG PO TABS
500.0000 mg | ORAL_TABLET | Freq: Two times a day (BID) | ORAL | Status: DC
  Administered 2022-08-31 – 2022-09-02 (×4): 500 mg via ORAL
  Filled 2022-08-31 (×4): qty 1

## 2022-08-31 MED ORDER — INSULIN ASPART 100 UNIT/ML IJ SOLN: 0.0000 [IU] | Freq: Every day | INTRAMUSCULAR | Status: DC

## 2022-08-31 MED ORDER — MEDIHONEY WOUND/BURN DRESSING EX PSTE
1.0000 | PASTE | Freq: Every day | CUTANEOUS | Status: DC
  Administered 2022-09-01 – 2022-09-02 (×2): 1 via TOPICAL
  Filled 2022-08-31: qty 44

## 2022-08-31 MED ORDER — VANCOMYCIN HCL 750 MG/150ML IV SOLN
750.0000 mg | Freq: Two times a day (BID) | INTRAVENOUS | Status: DC
  Administered 2022-09-01: 750 mg via INTRAVENOUS
  Filled 2022-08-31 (×3): qty 150

## 2022-08-31 MED ORDER — ATORVASTATIN CALCIUM 20 MG PO TABS
40.0000 mg | ORAL_TABLET | Freq: Every day | ORAL | Status: DC
  Administered 2022-08-31 – 2022-09-01 (×2): 40 mg via ORAL
  Filled 2022-08-31 (×2): qty 2

## 2022-08-31 MED ORDER — ACETAMINOPHEN 325 MG PO TABS
650.0000 mg | ORAL_TABLET | Freq: Once | ORAL | Status: AC
  Administered 2022-08-31: 650 mg via ORAL
  Filled 2022-08-31: qty 2

## 2022-08-31 NOTE — ED Notes (Signed)
This RN gave pt report, and informed RN that pt is coming to the floor.

## 2022-08-31 NOTE — Assessment & Plan Note (Addendum)
Patient met sepsis criteria with fever, mild tachycardia, tachypnea and leukocytosis.  Likely secondary to pneumonia as evident on chest x-ray.   Received IV fluid and broad-spectrum antibiotics in ED Swallow evaluation with concern of aspiration on thin liquid-dysphagia diet with thickened liquid ordered.  Preliminary blood cultures negative, strep pneumo negative, Legionella pending.  Urine cultures pending. MRSA PCR negative. Stop vancomycin -Switch antibiotics to Unasyn and Zithromax -Supportive care

## 2022-08-31 NOTE — ED Triage Notes (Signed)
Pt from home. Home health care believed pt PICC line was infected and removed pt PICC line. Pt was recently discharged from hospital for infection (thought to be from bed sores), pt did receive entirety of his ABX.

## 2022-08-31 NOTE — Assessment & Plan Note (Signed)
Patient has right hemiparesis. -Continue home aspirin and statin

## 2022-08-31 NOTE — Assessment & Plan Note (Signed)
Continue home Eliquis. 

## 2022-08-31 NOTE — Progress Notes (Signed)
Pharmacy Antibiotic Note  [name] is a 62 y.o. male admitted on 08/31/2022 with pneumonia.  Pharmacy has been consulted for vancomycin and cefepime dosing.  Patient presenting with fever and worsening cough. In ED, patient was tachycardic to 100s with WBC 14.4.  Plan: Vancomycin 1000 mg load given in ED Start vancomcyin 750 mg IV every 12 hours (eAUC 495, Scr 0.8, Vd 0.72 L/kg) Start cefepime 2 g IV every 8 hours based on current renal functions Monitor renal function, clinical status, culture data, and LOT F/u MRSA PCR and de-escalate as able  Height: 5\' 6"  (167.6 cm) Weight: 54 kg (119 lb 0.8 oz) IBW/kg (Calculated) : 63.8  Temp (24hrs), Avg:99.8 F (37.7 C), Min:98.7 F (37.1 C), Max:100.5 F (38.1 C)  Recent Labs  Lab 08/25/22 0336 08/27/22 0425 08/27/22 1147 08/28/22 0351 08/31/22 1432 08/31/22 1433  WBC  --  6.1  --  6.9 14.4*  --   CREATININE 0.45*  --  0.44* 0.40* 0.74  --   LATICACIDVEN  --   --   --   --   --  1.7    Estimated Creatinine Clearance: 75 mL/min (by C-G formula based on SCr of 0.74 mg/dL).    No Known Allergies  Antimicrobials this admission: cefepime 8/8 >>  vancomycin 8/8 >>   Dose adjustments this admission: N/A  Microbiology results: 8/8 BCx: pending 8/8 UCx: pending  8/8 MRSA PCR: pending  Thank you for involving pharmacy in this patient's care.   Rockwell Alexandria, PharmD Clinical Pharmacist 08/31/2022 5:53 PM
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
## 2022-08-31 NOTE — Assessment & Plan Note (Signed)
He appears at his baseline. Delirium precautions

## 2022-08-31 NOTE — Assessment & Plan Note (Signed)
Last A1c of 6.4.  Uses metformin at home -SSI

## 2022-08-31 NOTE — Hospital Course (Addendum)
[name] is a 62 y.o. male with medical history significant of advanced dementia, recent MRSA bacteremia 2/2 left heel ulcer on daptomycin, CVA with residual right hemiparesis, PAD s/p right AKA, previous PE on Eliquis, hypertension, hyperlipidemia, type 2 diabetes, HFpEF, and recent MRSA bacteremia, completed the course of antibiotic with daptomycin today and PICC line was removed by home health nurse this morning, was brought to ED with concern of worsening cough and lethargy for the past couple of days.   History of multiple recent hospitalizations for different infections. Recently completed the course of antibiotic with IV daptomycin for MRSA bacteremia.  Patient met sepsis criteria, likely secondary to left-sided pneumonia. Started on cefepime, Zithromax and vancomycin.  8/9: Afebrile and on room air at this morning, MRSA PCR came back negative so vancomycin is being discontinued.  Improving leukocytosis and decrease in hemoglobin, likely secondary to IV fluid as all cell lines decreased.  Potassium at 3 which is being repleted.  Preliminary blood cultures negative, strep pneumo negative.  Procalcitonin 0.43. Swallow evaluation with coughing on thin liquid, they were recommending thickened liquid with dysphagia 2 diet.  Patient with steady decline and multiple hospitalizations. Antibiotics switched with Unasyn and Zithromax. Palliative care was also consulted. PT and OT are recommending rehab.  8/10: Hemodynamically stable, remained on room air.  Unfortunately cannot go to rehab due to insurance issues and already has used his days.  Leukocytosis resolved, persistent hypokalemia which is being repleted again.  Urine cultures with no growth and blood cultures remain negative.  Patient is being discharged on Augmentin and Zithromax.  He should remain on dysphagia 2 diet with thick liquids.  Will resume home health services on discharge.  Patient will remain high risk for readmission,  deterioration and mortality based on poor underlying functional status and multiple comorbidities.  We did consulted palliative care but unable to have a family meeting due to limited time available by family member.  Patient will get benefit from seeing an outpatient palliative care to discuss further goals of care.  Patient is dependent for ADLs.  Clinically significantly improved.  Patient should follow precautions to prevent recurrent aspiration.   Patient will continue on current medications and need to have a close follow-up with his providers for further recommendations.

## 2022-08-31 NOTE — Assessment & Plan Note (Signed)
Patient with history of recent MRSA bacteremia and completed the course of antibiotics with daptomycin on 08/31/2022.  PICC line was removed.  TTE and TEE during that admission was negative.

## 2022-08-31 NOTE — Assessment & Plan Note (Signed)
Patient with history of unstageable sacral pressure injuries. Per ED provider does not look infected -Wound consult

## 2022-08-31 NOTE — Sepsis Progress Note (Signed)
Sepsis protocol monitored by eLink ?

## 2022-08-31 NOTE — Assessment & Plan Note (Signed)
Continue home Keppra. 

## 2022-08-31 NOTE — ED Provider Notes (Signed)
[facility] Provider Note    Event Date/Time   First MD Initiated Contact with Patient 08/31/22 1503     (approximate)   History   Code Sepsis   HPI  [name] is a 62 y.o. male with history of dementia, MRSA bacteremia, CVA with visual right hemiparesis, PAD, PE on Eliquis, T2DM, HTN, heart failure presenting to the ER for evaluation of possible infection.  Per report, home health team was concerned the patient's PICC line might be infected and removed it.  He had completed his antibiotic course.  Sent to the ER for further evaluation.  Flagged as a code sepsis for EMS.  I reviewed patient's discharge summary from 8/5.  He was admitted at that time for sepsis felt to be secondary to unstageable sacral decubitus ulcers.  I also reviewed discharge summary from 7/17 at which time patient was admitted for MRSA bacteremia with negative TTE and TEE for endocarditis.        Physical Exam   Triage Vital Signs: ED Triage Vitals  Encounter Vitals Group     BP 08/31/22 1435 117/76     Systolic BP Percentile --      Diastolic BP Percentile --      Pulse Rate 08/31/22 1435 100     Resp 08/31/22 1435 (!) 25     Temp 08/31/22 1435 (!) 100.5 F (38.1 C)     Temp Source 08/31/22 1435 Oral     SpO2 08/31/22 1435 93 %     Weight 08/31/22 1426 119 lb 0.8 oz (54 kg)     Height 08/31/22 1426 5\' 6"  (1.676 m)     Head Circumference --      Peak Flow --      Pain Score --      Pain Loc --      Pain Education --      Exclude from Growth Chart --     Most recent vital signs: Vitals:   08/31/22 1435  BP: 117/76  Pulse: 100  Resp: (!) 25  Temp: (!) 100.5 F (38.1 C)  SpO2: 93%     General: Awake, interactive CV:  Tachycardic with heart rate in the low 100s Resp:  Lung sounds diminished bilaterally Abd:  Soft, nondistended, not significantly tender to palpation Neuro:  Fluent speech, baseline deficits from prior stroke, oriented to self, thinks he is  in [facility], not oriented to situation MSK:  Dressing over the left foot, when taken down appears to be healing ulcers with granulation tissue.  Decubitus ulcer over the lower back, also appears to have healing tissue without significant surrounding erythema, warmth, drainage  ED Results / Procedures / Treatments   Labs (all labs ordered are listed, but only abnormal results are displayed) Labs Reviewed  COMPREHENSIVE METABOLIC PANEL - Abnormal; Notable for the following components:      Result Value   Glucose, Bld 291 (*)    BUN 43 (*)    Calcium 8.2 (*)    Total Protein 8.3 (*)    Albumin 2.6 (*)    All other components within normal limits  CBC WITH DIFFERENTIAL/PLATELET - Abnormal; Notable for the following components:   WBC 14.4 (*)    RBC 3.50 (*)    Hemoglobin 10.0 (*)    HCT 30.8 (*)    Neutro Abs 13.0 (*)    All other components within normal limits  PROTIME-INR - Abnormal; Notable for the following components:  Prothrombin Time 22.3 (*)    INR 1.9 (*)    All other components within normal limits  APTT - Abnormal; Notable for the following components:   aPTT 51 (*)    All other components within normal limits  RESP PANEL BY RT-PCR (RSV, FLU A&B, COVID)  RVPGX2  CULTURE, BLOOD (ROUTINE X 2)  CULTURE, BLOOD (ROUTINE X 2)  LACTIC ACID, PLASMA  LACTIC ACID, PLASMA  URINALYSIS, W/ REFLEX TO CULTURE (INFECTION SUSPECTED)     EKG EKG independently reviewed interpreted by myself (ER attending) demonstrates:  EKG demonstrates sinus tachycardia at a rate of 101, PR 129, QRS 85, QTc 423, no acute ST changes  RADIOLOGY Imaging independently reviewed and interpreted by myself demonstrates:    PROCEDURES:  Critical Care performed: Yes, see critical care procedure note(s)  CRITICAL CARE Performed by: Trinna Post   Total critical care time: 32 minutes  Critical care time was exclusive of separately billable procedures and treating other patients.  Critical  care was necessary to treat or prevent imminent or life-threatening deterioration.  Critical care was time spent personally by me on the following activities: development of treatment plan with patient and/or surrogate as well as nursing, discussions with consultants, evaluation of patient's response to treatment, examination of patient, obtaining history from patient or surrogate, ordering and performing treatments and interventions, ordering and review of laboratory studies, ordering and review of radiographic studies, pulse oximetry and re-evaluation of patient's condition.   Procedures   MEDICATIONS ORDERED IN ED: Medications  metroNIDAZOLE (FLAGYL) IVPB 500 mg (500 mg Intravenous New Bag/Given 08/31/22 1513)  vancomycin (VANCOCIN) IVPB 1000 mg/200 mL premix (has no administration in time range)  sodium chloride 0.9 % bolus 1,000 mL (1,000 mLs Intravenous New Bag/Given 08/31/22 1453)  ceFEPIme (MAXIPIME) 2 g in sodium chloride 0.9 % 100 mL IVPB (0 g Intravenous Stopped 08/31/22 1514)  acetaminophen (TYLENOL) tablet 650 mg (650 mg Oral Given 08/31/22 1519)     IMPRESSION / MDM / ASSESSMENT AND PLAN / ED COURSE  I reviewed the triage vital signs and the nursing notes.  Differential diagnosis includes, but is not limited to, questionable sepsis with potential infectious sources including pneumonia, UTI, decubitus ulcers, bacteremia secondary to PICC line  Patient's presentation is most consistent with acute presentation with potential threat to life or bodily function.  62 year old male presenting with fever, positive SIRS criteria with multiple recent admissions for acute infection.  Sepsis orders initiated with broad-spectrum antibiotics including cefepime, Flagyl, vancomycin.  Workup pending.  Anticipate ultimate admission.  Signed out oncoming provider at 1500 pending workup and disposition.      FINAL CLINICAL IMPRESSION(S) / ED DIAGNOSES   Final diagnoses:  Fever, unspecified fever  cause  Sepsis, due to unspecified organism, unspecified whether acute organ dysfunction present [facility])     Rx / DC Orders   ED Discharge Orders     None        Note:  This document was prepared using Dragon voice recognition software and may include unintentional dictation errors.   Trinna Post, MD 08/31/22 [phone]

## 2022-08-31 NOTE — Assessment & Plan Note (Signed)
Continue home finasteride and Flomax

## 2022-08-31 NOTE — TOC CM/SW Note (Signed)
Cm received message from [place] with [facility]. Pt is active with PT/OT/SW. Nurse with [facility] stated he has no living caregivers at home and doesn't eat a lot at home.

## 2022-08-31 NOTE — Progress Notes (Signed)
PHARMACY -  BRIEF ANTIBIOTIC NOTE   Pharmacy has received consult(s) for sepsis from an ED provider.  The patient's profile has been reviewed for ht/wt/allergies/indication/available labs.    One time order(s) placed for vancomycin and cefepime.  Further antibiotics/pharmacy consults should be ordered by admitting physician if indicated.                       Thank you for involving pharmacy in this patient's care.   Rockwell Alexandria, PharmD Clinical Pharmacist 08/31/2022 2:46 PM

## 2022-08-31 NOTE — Assessment & Plan Note (Signed)
-  Dietitian consult 

## 2022-08-31 NOTE — Assessment & Plan Note (Signed)
Appears euvolemic. -Monitor volume status as patient received IV fluid

## 2022-08-31 NOTE — Progress Notes (Signed)
CODE SEPSIS - PHARMACY COMMUNICATION  **Broad Spectrum Antibiotics should be administered within 1 hour of Sepsis diagnosis**  Time Code Sepsis Called/Page Received: 1446  Antibiotics Ordered: Cefepime, vancomycin, metronidazole  Time of 1st antibiotic administration: 1454  Additional action taken by pharmacy: None  If necessary, Name of Provider/Nurse Contacted: None   Thank you for involving pharmacy in this patient's care.   Rockwell Alexandria, PharmD Clinical Pharmacist 08/31/2022 2:59 PM

## 2022-08-31 NOTE — ED Provider Notes (Signed)
-----------------------------------------   3:03 PM on 08/31/2022 -----------------------------------------  Blood pressure 117/76, pulse 100, temperature (!) 100.5 F (38.1 C), temperature source Oral, resp. rate (!) 25, height 5\' 6"  (1.676 m), weight 54 kg, SpO2 93%.  Assuming care from Dr. Rosalia Hammers.  In short, [name] is a 62 y.o. male with a chief complaint of Code Sepsis .  Refer to the original H&P for additional details.  The current plan of care is to follow-up sepsis workup, anticipate admission.  ----------------------------------------- 4:13 PM on 08/31/2022 ----------------------------------------- Chest x-ray concerning for significant left-sided pneumonia and given other recent pneumonias I am concerned for aspiration.  Labs with leukocytosis but no significant anemia, electrolyte abnormality, or AKI, and lactic acid within normal limits.  Case discussed with hospitalist for admission.    Chesley Noon, MD 08/31/22 [phone]

## 2022-08-31 NOTE — Assessment & Plan Note (Signed)
Blood pressure currently within goal. Will hold home amlodipine today as patient is being admitted with sepsis. -We can resume if blood pressure started trending up

## 2022-09-01 DIAGNOSIS — I10 Essential (primary) hypertension: Secondary | ICD-10-CM | POA: Diagnosis not present

## 2022-09-01 DIAGNOSIS — E1165 Type 2 diabetes mellitus with hyperglycemia: Secondary | ICD-10-CM | POA: Diagnosis not present

## 2022-09-01 DIAGNOSIS — J189 Pneumonia, unspecified organism: Secondary | ICD-10-CM | POA: Diagnosis not present

## 2022-09-01 DIAGNOSIS — F0390 Unspecified dementia without behavioral disturbance: Secondary | ICD-10-CM | POA: Diagnosis not present

## 2022-09-01 LAB — MAGNESIUM: Magnesium: 1.9 mg/dL (ref 1.7–2.4)

## 2022-09-01 LAB — GLUCOSE, CAPILLARY
Glucose-Capillary: 115 mg/dL — ABNORMAL HIGH (ref 70–99)
Glucose-Capillary: 109 mg/dL — ABNORMAL HIGH (ref 70–99)
Glucose-Capillary: 185 mg/dL — ABNORMAL HIGH (ref 70–99)
Glucose-Capillary: 136 mg/dL — ABNORMAL HIGH (ref 70–99)
Glucose-Capillary: 126 mg/dL — ABNORMAL HIGH (ref 70–99)

## 2022-09-01 MED ORDER — JUVEN PO PACK
1.0000 | PACK | Freq: Two times a day (BID) | ORAL | Status: DC
  Administered 2022-09-02: 1 via ORAL

## 2022-09-01 MED ORDER — NEPRO/CARBSTEADY PO LIQD
237.0000 mL | Freq: Three times a day (TID) | ORAL | Status: DC
  Administered 2022-09-01 – 2022-09-02 (×2): 237 mL via ORAL

## 2022-09-01 MED ORDER — ENSURE ENLIVE PO LIQD: 237.0000 mL | Freq: Three times a day (TID) | ORAL | Status: DC

## 2022-09-01 MED ORDER — POTASSIUM CHLORIDE CRYS ER 20 MEQ PO TBCR
40.0000 meq | EXTENDED_RELEASE_TABLET | Freq: Once | ORAL | Status: AC
  Administered 2022-09-01: 40 meq via ORAL
  Filled 2022-09-01: qty 2

## 2022-09-01 MED ORDER — SODIUM CHLORIDE 0.9 % IV SOLN
3.0000 g | Freq: Four times a day (QID) | INTRAVENOUS | Status: DC
  Administered 2022-09-01 – 2022-09-02 (×5): 3 g via INTRAVENOUS
  Filled 2022-09-01 (×6): qty 8

## 2022-09-01 MED ORDER — AZITHROMYCIN 500 MG PO TABS
500.0000 mg | ORAL_TABLET | Freq: Every day | ORAL | Status: DC
  Administered 2022-09-01 – 2022-09-02 (×2): 500 mg via ORAL
  Filled 2022-09-01 (×2): qty 1

## 2022-09-01 NOTE — Progress Notes (Signed)
PHARMACIST - PHYSICIAN COMMUNICATION DR: Nelson Chimes CONCERNING: Antibiotic IV to Oral Route Change Policy  RECOMMENDATION: This patient is receiving azithromycin by the intravenous route.  Based on criteria approved by the Pharmacy and Therapeutics Committee, the antibiotic(s) is/are being converted to the equivalent oral dose form(s).  DESCRIPTION: These criteria include: Patient being treated for a respiratory tract infection, urinary tract infection, cellulitis or clostridium difficile associated diarrhea if on metronidazole The patient is not neutropenic and does not exhibit a GI malabsorption state The patient is eating (either orally or via tube) and/or has been taking other orally administered medications for a least 24 hours The patient is improving clinically and has a Tmax < 100.5  If you have questions about this conversion, please contact the Pharmacy Department  []   [phone] )  [facility] [x]   [phone] )  [facility] []   [phone] )  [facility]  []   [phone] )  [facility] []   [phone] )  Ilene Qua    Littie Deeds, PharmD PGY1 Pharmacy Resident

## 2022-09-01 NOTE — Consult Note (Addendum)
WOC Nurse Consult Note: Reason for Consult: Consult requested for several wounds.  Pt is familiar from recent admission on 8/2 for Unstageable pressure injury; surgical team assessed at that time and did not recommend further interventions. Sacrum  Unstageable pressure injury; 90% tightly adhered slough/eschar, 10% red, 5X5cm, some odor, small amt tan drainage.  Right lower buttock with Unstagable pressure injury; 90% slough, mod amt tan drainage, .5X.5cm, surrounded by pink dry scar tissue. Left heel with dark red-purple Deep tissue pressure injury; 5X4cm Left outer foot with Unstageable pressure injury; 50% yellow, 50% eschar, 2X5cm, mod amt tan drainage Left anterior foot with full thickness wound, 80% yellow, 20% red, .8X.8cm, small amt tan drainage Pressure Injury POA: Yes Dressing procedure/placement/frequency: Prevalon boot ordered to reduce pressure to left heel. Topical treatment orders provided for bedside nurses to perform as follows to assist with removal of nonviable tissue: Apply Medihoney to right lower buttock, sacrum, left anterior foot, left outer foot Q day, then cover with foam dressing.  Change foam dressing Q 3 days or PRN soiling. Please re-consult if further assistance is needed.  Thank-you,  Cammie Mcgee MSN, RN, CWOCN, [place], CNS [phone]

## 2022-09-01 NOTE — Assessment & Plan Note (Signed)
Potassium at 3, magnesium normal -Replete potassium and monitor

## 2022-09-01 NOTE — Progress Notes (Signed)
Physical Therapy Evaluation Patient Details Name: [name] MRN: [id number] DOB: [birth year] Today's Date: 09/01/2022  History of Present Illness  [name] is a 62 y.o. male with medical history significant of recent MRSA bacteremia 2/2 left heel ulcer, CVA with residual right hemiparesis, PAD s/p stent of right popliteal, previous PE on Eliquis, hypertension, hyperlipidemia, type 2 diabetes, HFpEF, hx of R BKA, dementia. Patient with multiple recent hospitalizations for infections. Presented to ED with concern of worsening cough and lethargy for the past couple of days.   Clinical Impression  Patient supine in bed upon arrival, agreeable to PT/OT Co-Evaluation this date. Patient verbalizes requiring assistance for transfers, mobility at home, and sponges bathes at baseline from brother. Unaware of level of assistance needed prior to admission as no family at bedside at this time. Patient require Max A - Total A +2 for all aspects of bed mobility. Patient unable to sit EOB without intermittent assistance due to impaired seated balance, tolerated for approx ~5 minutes before needing to return to supine position due to fatigue. Patient lethargic throughout session. Patient will benefit from skilled acute PT services to address impairments (See below for additional details) and maximize functional mobility to reduce caregiver burden. Patient left with all needs in reach and bed alarm set. Will continue to follow acutely.       If plan is discharge home, recommend the following: Two people to help with walking and/or transfers;Assist for transportation;Assistance with feeding;A lot of help with bathing/dressing/bathroom   Can travel by private vehicle   No    Equipment Recommendations Hoyer lift  Recommendations for Other Services       Functional Status Assessment Patient has had a recent decline in their functional status and demonstrates the ability to make significant improvements  in function in a reasonable and predictable amount of time.     Precautions / Restrictions Precautions Precautions: None Restrictions Weight Bearing Restrictions: No      Mobility  Bed Mobility Overal bed mobility: Needs Assistance Bed Mobility: Supine to Sit, Sit to Supine, Rolling Rolling: Max assist, +2 for physical assistance   Supine to sit: Total assist, +2 for physical assistance Sit to supine: Total assist, +2 for physical assistance   General bed mobility comments: Patient requiring Max to Total A +2 for all aspects of bed mobility, minimal engagement from patient, lethargic throughout session. Patient able to tolerate seated EOB for ~ 5 minutes before returning supine. +2 assist to reposition in bed.    Transfers                   General transfer comment: deferred this date    Ambulation/Gait                  Stairs            Wheelchair Mobility     Tilt Bed    Modified Rankin (Stroke Patients Only)       Balance Overall balance assessment: Needs assistance Sitting-balance support: Bilateral upper extremity supported (LLE supported) Sitting balance-Leahy Scale: Poor Sitting balance - Comments: Sitting EOB patient with intermittent loss of balance, require frequent assistance to maintain static seated balance. Patient able to hold intermittent for 5-10 seconds without PT assistance. Increased balance challenge with increasing fatigue.  Pertinent Vitals/Pain Pain Assessment Pain Assessment: No/denies pain    Home Living Family/patient expects to be discharged to:: Private residence Living Arrangements: Other relatives (brother) Available Help at Discharge: Family;Personal care attendant (private nurse daily - no family to verify this) Type of Home: Apartment Home Access: Level entry       Home Layout: One level Home Equipment: Agricultural consultant (2 wheels);Wheelchair -  manual;Shower seat;Hospital bed      Prior Function Prior Level of Function : Needs assist             Mobility Comments: Pt reports he require assist to get from bed to chair from brother - unclear how much assist. ADLs Comments: sponge bath with private nurse     Extremity/Trunk Assessment   Upper Extremity Assessment Upper Extremity Assessment: Generalized weakness    Lower Extremity Assessment Lower Extremity Assessment: Generalized weakness;LLE deficits/detail;RLE deficits/detail RLE Deficits / Details: Hx of BKA on RLE LLE Deficits / Details: ROM limitations noted on LLE, unable to obtain full extension.       Communication   Communication Communication: Difficulty communicating thoughts/reduced clarity of speech (difficulty with clarity and hearing patient speak)  Cognition Arousal: Lethargic Behavior During Therapy: Flat affect Overall Cognitive Status: History of cognitive impairments - at baseline                                          General Comments      Exercises     Assessment/Plan    PT Assessment Patient needs continued PT services  PT Problem List Decreased strength;Decreased range of motion;Decreased balance;Decreased activity tolerance;Decreased mobility       PT Treatment Interventions DME instruction;Functional mobility training;Therapeutic activities;Balance training;Therapeutic exercise;Neuromuscular re-education    PT Goals (Current goals can be found in the Care Plan section)  Acute Rehab PT Goals Patient Stated Goal:  (patient did not participate in PT goal setting due to lethargy) PT Goal Formulation: With patient Time For Goal Achievement: 09/15/22 Potential to Achieve Goals: Good    Frequency Min 1X/week     Co-evaluation PT/OT/SLP Co-Evaluation/Treatment: Yes Reason for Co-Treatment: For patient/therapist safety;To address functional/ADL transfers PT goals addressed during session: Mobility/safety with  mobility         AM-PAC PT "6 Clicks" Mobility  Outcome Measure Help needed turning from your back to your side while in a flat bed without using bedrails?: A Lot Help needed moving from lying on your back to sitting on the side of a flat bed without using bedrails?: Total Help needed moving to and from a bed to a chair (including a wheelchair)?: Total Help needed standing up from a chair using your arms (e.g., wheelchair or bedside chair)?: Total Help needed to walk in hospital room?: Total Help needed climbing 3-5 steps with a railing? : Total 6 Click Score: 7    End of Session   Activity Tolerance: Patient limited by lethargy;Patient limited by fatigue Patient left: in bed;with bed alarm set;with call bell/phone within reach Nurse Communication: Mobility status PT Visit Diagnosis: Other abnormalities of gait and mobility (R26.89);Muscle weakness (generalized) (M62.81)    Time: 4098-1191 PT Time Calculation (min) (ACUTE ONLY): 18 min   Charges:   PT Evaluation $PT Eval Moderate Complexity: 1 Mod   PT General Charges $$ ACUTE PT VISIT: 1 Visit        Howie Ill, PT, DPT 09/01/22 11:59  AM

## 2022-09-01 NOTE — Plan of Care (Signed)

## 2022-09-01 NOTE — Evaluation (Signed)
Occupational Therapy Evaluation Patient Details Name: [name] MRN: [id number] DOB: 1960-06-13 Today's Date: 09/01/2022   History of Present Illness [name] is a 62 y.o. male with medical history significant of recent MRSA bacteremia 2/2 left heel ulcer, CVA with residual right hemiparesis, PAD s/p stent of right popliteal, previous PE on Eliquis, hypertension, hyperlipidemia, type 2 diabetes, HFpEF, hx of R BKA, dementia. Patient with multiple recent hospitalizations for infections. Presented to ED with concern of worsening cough and lethargy for the past couple of days.   Clinical Impression   Pt seen for OT evaluation and cotx with PT to optimize safety for ADL/mobility. Pt lethargic  but agreeable to evaluation. Pt notes living with his brother and more recently home from long period of time at rehab. Pt notes he requires assist from his brother at baseline but unclear how much assist. Pt notes he has help from a PCA daily for sponge baths and other ADL. No family present to verify. Pt required MAX A to TOTAL A +2 for bed mobility and required assist for static sitting balance with intermittent periods of CGA. Only tolerated for ~68min before endorsing fatigue and ready for return to bed. Pt will benefit from skilled OT services to maximize safety/indep and minimize falls risk, caregiver burden, and further functional decline.     If plan is discharge home, recommend the following: Two people to help with walking and/or transfers;A lot of help with bathing/dressing/bathroom;Assistance with feeding;Assistance with cooking/housework;Assist for transportation;Help with stairs or ramp for entrance;Direct supervision/assist for medications management;Direct supervision/assist for financial management    Functional Status Assessment  Patient has had a recent decline in their functional status and demonstrates the ability to make significant improvements in function in a reasonable and  predictable amount of time.  Equipment Recommendations  Other (comment) (if he returns home would require a hoyer lift and hospital bed)    Recommendations for Other Services       Precautions / Restrictions Precautions Precautions: None Restrictions Weight Bearing Restrictions: No Other Position/Activity Restrictions: hx R AKA      Mobility Bed Mobility Overal bed mobility: Needs Assistance Bed Mobility: Supine to Sit, Sit to Supine, Rolling Rolling: Max assist, +2 for physical assistance   Supine to sit: Total assist, +2 for physical assistance Sit to supine: Total assist, +2 for physical assistance   General bed mobility comments: Patient requiring Max to Total A +2 for all aspects of bed mobility, minimal engagement from patient, lethargic throughout session. Patient able to tolerate seated EOB for ~ 5 minutes before returning supine. +2 assist to reposition in bed.    Transfers                   General transfer comment: deferred this date      Balance Overall balance assessment: Needs assistance Sitting-balance support: Bilateral upper extremity supported (LLE supported) Sitting balance-Leahy Scale: Poor Sitting balance - Comments: Sitting EOB patient with intermittent loss of balance, require frequent assistance to maintain static seated balance. Patient able to hold intermittent for 5-10 seconds without PT assistance. Increased balance challenge with increasing fatigue.                                   ADL either performed or assessed with clinical judgement   ADL Overall ADL's : Needs assistance/impaired  General ADL Comments: Pt currently requires MAX A for all LB ADL, toileting from bed level, and MOD A for UB ADL tasks. Able to bring cup to mouth with L hand with effort.     Vision         Perception         Praxis         Pertinent Vitals/Pain       Extremity/Trunk  Assessment Upper Extremity Assessment Upper Extremity Assessment: Generalized weakness;Right hand dominant (RUE slightly weaker than LUE, decreased shoulder AROM 2/2 old CVA)   Lower Extremity Assessment Lower Extremity Assessment: Generalized weakness;RLE deficits/detail;LLE deficits/detail RLE Deficits / Details: Hx of BKA on RLE LLE Deficits / Details: ROM limitations noted on LLE, unable to obtain full extension.       Communication Communication Communication: Difficulty communicating thoughts/reduced clarity of speech (difficulty with clarity and hearing patient speak)   Cognition Arousal: Lethargic Behavior During Therapy: Flat affect Overall Cognitive Status: History of cognitive impairments - at baseline                                 General Comments: pt required cues for sequencing, increased processing time     General Comments       Exercises     Shoulder Instructions      Home Living Family/patient expects to be discharged to:: Private residence Living Arrangements: Other relatives (brother) Available Help at Discharge: Family;Personal care attendant (private nurse daily - no family to verify this) Type of Home: Apartment Home Access: Level entry     Home Layout: One level     Bathroom Shower/Tub: Sponge bathes at baseline   Bathroom Toilet: Standard     Home Equipment: Agricultural consultant (2 wheels);Wheelchair - manual;Shower seat;Hospital bed          Prior Functioning/Environment Prior Level of Function : Needs assist             Mobility Comments: Pt reports he require assist to get from bed to chair from brother - unclear how much assist. ADLs Comments: sponge bath with private nurse        OT Problem List: Decreased strength;Decreased range of motion;Cardiopulmonary status limiting activity;Decreased cognition;Decreased safety awareness;Decreased activity tolerance;Impaired balance (sitting and/or standing);Decreased  knowledge of use of DME or AE;Impaired UE functional use      OT Treatment/Interventions: Therapeutic exercise;Self-care/ADL training;Therapeutic activities;Cognitive remediation/compensation;DME and/or AE instruction;Neuromuscular education;Patient/family education;Balance training;Manual therapy    OT Goals(Current goals can be found in the care plan section) Acute Rehab OT Goals Patient Stated Goal: go home OT Goal Formulation: With patient Time For Goal Achievement: 09/15/22 Potential to Achieve Goals: Fair ADL Goals Pt Will Perform Grooming: with set-up;with supervision;sitting (supported sitting) Pt Will Perform Lower Body Dressing: with mod assist;sitting/lateral leans Pt Will Transfer to Toilet: squat pivot transfer;with mod assist;bedside commode  OT Frequency: Min 1X/week    Co-evaluation PT/OT/SLP Co-Evaluation/Treatment: Yes Reason for Co-Treatment: For patient/therapist safety;To address functional/ADL transfers PT goals addressed during session: Mobility/safety with mobility OT goals addressed during session: ADL's and self-care      AM-PAC OT "6 Clicks" Daily Activity     Outcome Measure Help from another person eating meals?: A Little Help from another person taking care of personal grooming?: A Little Help from another person toileting, which includes using toliet, bedpan, or urinal?: Total Help from another person bathing (including washing, rinsing, drying)?: A Lot Help from another  person to put on and taking off regular upper body clothing?: A Lot Help from another person to put on and taking off regular lower body clothing?: A Lot 6 Click Score: 13   End of Session    Activity Tolerance: Patient limited by fatigue Patient left: in bed;with call bell/phone within reach;with bed alarm set  OT Visit Diagnosis: Other abnormalities of gait and mobility (R26.89);Muscle weakness (generalized) (M62.81)                Time: 1610-9604 OT Time Calculation (min): 17  min Charges:  OT General Charges $OT Visit: 1 Visit OT Evaluation $OT Eval Moderate Complexity: 1 Mod  Arman Filter., MPH, MS, OTR/L ascom [phone] 09/01/22, 1:28 PM

## 2022-09-01 NOTE — Progress Notes (Addendum)
Progress Note   Patient: [name] DOB: 1960-02-20 DOA: 08/31/2022     1 DOS: the patient was seen and examined on 09/01/2022   Brief hospital course: [name] is a 62 y.o. male with medical history significant of advanced dementia, recent MRSA bacteremia 2/2 left heel ulcer on daptomycin, CVA with residual right hemiparesis, PAD s/p right AKA, previous PE on Eliquis, hypertension, hyperlipidemia, type 2 diabetes, HFpEF, and recent MRSA bacteremia, completed the course of antibiotic with daptomycin today and PICC line was removed by home health nurse this morning, was brought to ED with concern of worsening cough and lethargy for the past couple of days.   History of multiple recent hospitalizations for different infections. Recently completed the course of antibiotic with IV daptomycin for MRSA bacteremia.  Patient met sepsis criteria, likely secondary to left-sided pneumonia. Started on cefepime, Zithromax and vancomycin.  8/9: Afebrile and on room air at this morning, MRSA PCR came back negative so vancomycin is being discontinued.  Improving leukocytosis and decrease in hemoglobin, likely secondary to IV fluid as all cell lines decreased.  Potassium at 3 which is being repleted.  Preliminary blood cultures negative, strep pneumo negative.  Procalcitonin 0.43. Swallow evaluation with coughing on thin liquid, they were recommending thickened liquid with dysphagia 2 diet.  Patient with steady decline and multiple hospitalizations. Antibiotics switched with Unasyn and Zithromax. Palliative care was also consulted. PT and OT are recommending rehab.  Assessment and Plan: * Sepsis due to pneumonia [facility]) Patient met sepsis criteria with fever, mild tachycardia, tachypnea and leukocytosis.  Likely secondary to pneumonia as evident on chest x-ray.   Received IV fluid and broad-spectrum antibiotics in ED Swallow evaluation with concern of aspiration on thin  liquid-dysphagia diet with thickened liquid ordered.  Preliminary blood cultures negative, strep pneumo negative, Legionella pending.  Urine cultures pending. MRSA PCR negative. Stop vancomycin -Switch antibiotics to Unasyn and Zithromax -Supportive care  MRSA bacteremia Patient with history of recent MRSA bacteremia and completed the course of antibiotics with daptomycin on 08/31/2022.  PICC line was removed.  TTE and TEE during that admission was negative.  Dementia (HCC) He appears at his baseline. Delirium precautions  Essential hypertension Blood pressure currently within goal. Will hold home amlodipine today as patient is being admitted with sepsis. -We can resume if blood pressure started trending up  Controlled type 2 diabetes mellitus with hyperglycemia, without long-term current use of insulin (HCC) Last A1c of 6.4.  Uses metformin at home -SSI   Seizure disorder (HCC) -Continue home Keppra  History of CVA (cerebrovascular accident) Patient has right hemiparesis. -Continue home aspirin and statin  Chronic diastolic CHF (congestive heart failure) (HCC) Appears euvolemic. -Monitor volume status as patient received IV fluid  History of pulmonary embolism -Continue home Eliquis  Pressure injury of skin Patient with history of unstageable sacral pressure injuries. Per ED provider does not look infected -Wound consult  BPH (benign prostatic hyperplasia) -Continue home finasteride and Flomax  Protein-calorie malnutrition, severe -Dietitian consult  Hypokalemia Potassium at 3, magnesium normal -Replete potassium and monitor      Subjective: Patient appears little improved when seen this morning.  No new concern  Physical Exam: Vitals:   09/01/22 0425 09/01/22 0735 09/01/22 0900 09/01/22 1120  BP: (!) 110/59 114/65  118/64  Pulse: 89 88  93  Resp: 16 17 (!) 22 18  Temp: 98.7 F (37.1 C) 98.5 F (36.9 C)  99.6 F (37.6 C)  TempSrc: Oral  SpO2: 100%  100%  99%  Weight:      Height:       General.  Chronically ill-appearing gentleman, in no acute distress. Pulmonary.  Lungs clear bilaterally, normal respiratory effort. CV.  Regular rate and rhythm, no JVD, rub or murmur. Abdomen.  Soft, nontender, nondistended, BS positive. CNS.  Alert and oriented .  No focal neurologic deficit. Extremities.  Right AKA, no edema left Psychiatry.  Appears to have some cognitive impairment  Data Reviewed: Prior data reviewed  Family Communication: Talked with sister on phone.  Disposition: Status is: Inpatient Remains inpatient appropriate because: Severity of illness.  Planned Discharge Destination: Skilled nursing facility  DVT prophylaxis: Eliquis Time spent: 45 minutes  This record has been created using Conservation officer, historic buildings. Errors have been sought and corrected,but may not always be located. Such creation errors do not reflect on the standard of care.   Author: Arnetha Courser, MD 09/01/2022 2:27 PM  For on call review [email or web address].

## 2022-09-01 NOTE — Progress Notes (Addendum)
Initial Nutrition Assessment  DOCUMENTATION CODES:   Severe malnutrition in context of chronic illness  INTERVENTION:  - Add Ensure Enlive po TID, each supplement provides 350 kcal and 20 grams of protein.  - Add -1 packet Juven BID, each packet provides 95 calories, 2.5 grams of protein (collagen), and 9.8 grams of carbohydrate (3 grams sugar); also contains 7 grams of L-arginine and L-glutamine, 300 mg vitamin C, 15 mg vitamin E, 1.2 mcg vitamin B-12, 9.5 mg zinc, 200 mg calcium, and 1.5 g  Calcium Beta-hydroxy-Beta-methylbutyrate to support wound healing  NUTRITION DIAGNOSIS:   Severe Malnutrition related to chronic illness as evidenced by severe muscle depletion, severe fat depletion.  GOAL:   Patient will meet greater than or equal to 90% of their needs  MONITOR:   PO intake, Supplement acceptance  REASON FOR ASSESSMENT:   Consult Assessment of nutrition requirement/status  ASSESSMENT:   62 y.o. male admits related to worsening cough and lethargy. PMH includes: DM, HTN, HLD, osteomyelitis, PAD, pulmonary embolism, stroke. Pt is currently receiving medical management related to sepsis due to pneumonia.  Meds reviewed: Vit C, lipitor, sliding scale insulin. Labs reviewed: K low, BUN elevated.   The pt was sleeping at time of assessment. Pt is currently oriented x1. Per record, pt ate 100% of his breakfast tray this am. Pt with significantly increased nutrient needs related to unstageable pressure injury and severe malnutrition. Per record, pt has experienced a 7.6% wt loss over the past month. RD will add Ensure with meals and continue to monitor PO intakes.   NUTRITION - FOCUSED PHYSICAL EXAM:  Flowsheet Row Most Recent Value  Orbital Region Severe depletion  Upper Arm Region Severe depletion  Thoracic and Lumbar Region Severe depletion  Buccal Region Severe depletion  Temple Region Severe depletion  Clavicle Bone Region Severe depletion  Clavicle and Acromion Bone  Region Severe depletion  Scapular Bone Region Unable to assess  Dorsal Hand Severe depletion  Patellar Region Severe depletion  Anterior Thigh Region Severe depletion  Posterior Calf Region Severe depletion  Edema (RD Assessment) None  Hair Reviewed  Eyes Unable to assess  Mouth Unable to assess  Skin Reviewed  Nails Reviewed       Diet Order:   Diet Order             DIET DYS 2 Room service appropriate? Yes with Assist; Fluid consistency: Nectar Thick  Diet effective now                   EDUCATION NEEDS:   Not appropriate for education at this time  Skin:  Skin Assessment: Skin Integrity Issues: Skin Integrity Issues:: Unstageable Unstageable: mid sacrum  Last BM:  PTA  Height:   Ht Readings from Last 1 Encounters:  08/31/22 5\' 6"  (1.676 m)    Weight:   Wt Readings from Last 1 Encounters:  08/31/22 54 kg    Ideal Body Weight:     BMI:  Body mass index is 19.21 kg/m.  Estimated Nutritional Needs:   Kcal:  1600-1900 kcals  Protein:  80-95 gm  Fluid:  >/= 1.6 L  Bethann Humble, RD, LDN, CNSC.

## 2022-09-01 NOTE — TOC Initial Note (Addendum)
Transition of Care [facility]) - Initial/Assessment Note    Patient Details  Name: [name] MRN: [id number] Date of Birth: 04/13/1960  [facility] CM/SW Contact:    Allena Katz, LCSW Phone Number: 09/01/2022, 2:52 PM  Clinical Narrative:                 Pt readmitted from home, discharged on 08/28/2022. Pt active with [facility]. Pt also has a caregiver that comes out to assist Care giver name is Kapone Watz [phone]. Pt was at SNF from 01/02/2022 to 07/07/2022 (186 days) and has not had his period of wellness so cannot go to rehab.    Current DME hospital bed, WC, BSC, RW, cane.   CSW spoke with sister who confirms brother stays with pt and assists with care and caregiver Baxter Hire comes in three times a day. Sister is requesting a gel overlay for patients mattress. CSW contacted Mitch from [employer] who will deliver to patients home tomorrow once order is in. Per sister medicaid application is in progress she has not heard anything yet.        Patient Goals and CMS Choice            Expected Discharge Plan and Services                                              Prior Living Arrangements/Services                       Activities of Daily Living Home Assistive Devices/Equipment: Other (Comment) ADL Screening (condition at time of admission) Patient's cognitive ability adequate to safely complete daily activities?: No Is the patient deaf or have difficulty hearing?: No Does the patient have difficulty seeing, even when wearing glasses/contacts?: No Does the patient have difficulty concentrating, remembering, or making decisions?: Yes Patient able to express need for assistance with ADLs?: Yes Does the patient have difficulty dressing or bathing?: Yes Independently performs ADLs?: No Dressing (OT): Needs assistance Grooming: Needs assistance Feeding: Needs assistance Bathing: Needs assistance Toileting: Needs assistance In/Out  Bed: Needs assistance Does the patient have difficulty walking or climbing stairs?: Yes Weakness of Legs:  (right AKA) Weakness of Arms/Hands: Right  Permission Sought/Granted                  Emotional Assessment              Admission diagnosis:  Fever, unspecified fever cause [R50.9] Sepsis due to pneumonia (HCC) [J18.9, A41.9] Sepsis, due to unspecified organism, unspecified whether acute organ dysfunction present [facility]) [A41.9] Patient Active Problem List   Diagnosis Date Noted   Sepsis due to pneumonia (HCC) 08/31/2022   Febrile illness 08/22/2022   Dementia (HCC) 08/22/2022   Bacteremia 08/08/2022   Pressure injury of skin 08/08/2022   MRSA bacteremia 08/04/2022   Pneumonia 08/02/2022   S/P AKA (above knee amputation), right (HCC) 12/30/2021   Protein-calorie malnutrition, severe 12/28/2021   Acute lower limb ischemia 12/26/2021   Hemiparesis affecting right side as late effect of cerebrovascular accident (CVA) (HCC) 12/26/2021   History of pulmonary embolism 12/26/2021   Chronic anticoagulation 12/26/2021   BPH (benign prostatic hyperplasia) 12/26/2021   Diastolic dysfunction 07/15/2021   Hyperlipidemia LDL goal <70 07/15/2021   Atherosclerosis of native arteries of the extremities with ulceration (HCC) 07/01/2021   Fever  Osteomyelitis (HCC)    Controlled type 2 diabetes mellitus with hyperglycemia, without long-term current use of insulin (HCC)    Pulmonary embolism (HCC) 05/06/2021   Chronic diastolic CHF (congestive heart failure) (HCC) 05/02/2021   Hyponatremia 05/02/2021   Acute urinary retention 05/01/2021   Peripheral artery disease (HCC) 04/30/2021   Foot osteomyelitis, right (HCC) 04/27/2021   Open wound of right foot 04/26/2021   Generalized weakness 04/26/2021   Acute pulmonary embolism (HCC) 04/25/2021   Elevated troponin    History of CVA (cerebrovascular accident) 04/23/2021   Seizure disorder (HCC) 09/16/2018   Essential hypertension  09/14/2018   TIA (transient ischemic attack) 09/14/2018   Right sided weakness 09/14/2018   Vitamin B12 deficiency 02/02/2018   Stroke (HCC) 01/27/2018   Ataxia 01/26/2018   Hypertensive urgency 01/26/2018   Uncontrolled type 2 diabetes mellitus with hyperglycemia, without long-term current use of insulin (HCC) 01/26/2018   Hypokalemia 01/26/2018   Peripheral polyneuropathy 03/25/2015   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 [place] [address] Phone: [phone] Fax: [phone]  [facility] [address]), [place] - [address]) [place] [address] Phone: [phone] Fax: [phone]     Social Determinants of Health (SDOH) Social History: SDOH Screenings   Food Insecurity: Patient Declined (08/31/2022)  Housing: Patient Declined (08/31/2022)  Transportation Needs: Patient Declined (08/31/2022)  Utilities: Patient Declined (08/31/2022)  Tobacco Use: High Risk (08/31/2022)   SDOH Interventions:     Readmission Risk Interventions    09/01/2022    2:52 PM  Readmission Risk Prevention Plan  Transportation Screening Complete  PCP or Specialist Appt within 3-5 Days Complete  HRI or Home Care Consult Complete  Palliative Care Screening Complete  Medication Review (RN Care Manager) Complete
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
## 2022-09-01 NOTE — Progress Notes (Signed)
Pharmacy Antibiotic Note  JANSSEN PULS is a 62 y.o. male admitted on 08/31/2022 with sepsis secondary to pneumonia. Patient presented with a fever and cough that has worsened over the past few days. In ED, patient was tachycardic to 100s with WBC 14.4. He has had multiple recent hospitalizations for different infections. Most recently was admitted for sepsis secondary to sacral ulcers. Prior to this, was admitted for MRSA bacteremia (negative endocarditis) and discharged home on IV daptomycin to end 08/31/22. Patient completed full course of antibiotics yesterday and PICC line was removed by home health nurse 8/8 AM (there was concern the line was infected). Pharmacy has been consulted for cefepime dosing.   Plan: Continue cefepime 2 g IV Q8H MRSA nares negative - discontinue vancomycin Monitor renal function, clinical status, culture data, and LOT  Height: 5\' 6"  (167.6 cm) Weight: 54 kg (119 lb 0.8 oz) IBW/kg (Calculated) : 63.8  Temp (24hrs), Avg:99.1 F (37.3 C), Min:98.5 F (36.9 C), Max:100.5 F (38.1 C)  Recent Labs  Lab 08/27/22 0425 08/27/22 1147 08/28/22 0351 08/31/22 1432 08/31/22 1433 08/31/22 1703 09/01/22 0506  WBC 6.1  --  6.9 14.4*  --   --  12.5*  CREATININE  --  0.44* 0.40* 0.74  --   --  0.50*  LATICACIDVEN  --   --   --   --  1.7 1.9  --     Estimated Creatinine Clearance: 75 mL/min (A) (by C-G formula based on SCr of 0.5 mg/dL (L)).    No Known Allergies  Antimicrobials this admission: Metronidazole 500 mg x 1 Vancomycin 8/8 >> 8/9 Cefepime 8/8 >>  Azithromycin 8/8 >>  Dose adjustments this admission: N/A  Microbiology results: 8/8 BCx: NG < 24h 8/8 MRSA PCR: negative 8/8 Respiratory panel: negative 8/8 UCx: pending   Thank you for involving pharmacy in this patient's care.   Littie Deeds, PharmD PGY1 Pharmacy Resident 09/01/2022 7:47 AM

## 2022-09-02 DIAGNOSIS — A419 Sepsis, unspecified organism: Secondary | ICD-10-CM | POA: Diagnosis not present

## 2022-09-02 DIAGNOSIS — Z8673 Personal history of transient ischemic attack (TIA), and cerebral infarction without residual deficits: Secondary | ICD-10-CM | POA: Diagnosis not present

## 2022-09-02 DIAGNOSIS — F0390 Unspecified dementia without behavioral disturbance: Secondary | ICD-10-CM | POA: Diagnosis not present

## 2022-09-02 DIAGNOSIS — R509 Fever, unspecified: Secondary | ICD-10-CM | POA: Diagnosis not present

## 2022-09-02 DIAGNOSIS — I10 Essential (primary) hypertension: Secondary | ICD-10-CM | POA: Diagnosis not present

## 2022-09-02 DIAGNOSIS — J189 Pneumonia, unspecified organism: Secondary | ICD-10-CM | POA: Diagnosis not present

## 2022-09-02 LAB — GLUCOSE, CAPILLARY
Glucose-Capillary: 110 mg/dL — ABNORMAL HIGH (ref 70–99)
Glucose-Capillary: 116 mg/dL — ABNORMAL HIGH (ref 70–99)

## 2022-09-02 MED ORDER — AZITHROMYCIN 500 MG PO TABS: 500.0000 mg | ORAL_TABLET | Freq: Every day | ORAL | 0 refills | Status: AC

## 2022-09-02 MED ORDER — NEPRO/CARBSTEADY PO LIQD: 237.0000 mL | Freq: Three times a day (TID) | ORAL | 11 refills | Status: DC

## 2022-09-02 MED ORDER — POTASSIUM CHLORIDE 20 MEQ PO PACK
40.0000 meq | PACK | ORAL | Status: AC
  Administered 2022-09-02: 40 meq via ORAL
  Filled 2022-09-02 (×2): qty 2

## 2022-09-02 MED ORDER — JUVEN PO PACK: 1.0000 | PACK | Freq: Two times a day (BID) | ORAL | 2 refills | Status: DC

## 2022-09-02 MED ORDER — AMOXICILLIN-POT CLAVULANATE 875-125 MG PO TABS: 1.0000 | ORAL_TABLET | Freq: Two times a day (BID) | ORAL | 0 refills | Status: AC

## 2022-09-02 NOTE — Progress Notes (Signed)
Patient is being discharged home with [facility]. AVS placed in a hospital bag along with patient's home medications. AVS reviewed with patient, no evidence of understanding. Reviewed AVS over the phone with patient's sister, Itzae Crawl. She verbalized understanding. Awaiting EMS.

## 2022-09-02 NOTE — Plan of Care (Signed)

## 2022-09-02 NOTE — Consult Note (Cosign Needed Addendum)
Consultation Note Date: 09/02/2022   Patient Name: [name]  DOB: Aug 03, 1960  MRN: [id number]  Age / Sex: 62 y.o., male  PCP: Gregory Fossa, PA-C Referring Physician: Arnetha Courser, MD  Reason for Consultation: Establishing goals of care   HPI/Brief Hospital Course: 62 y.o. male  with past medical history of dementia, CVA with residual right sided hemiparesis, PAD s/p right AKA, PE on Eliquis, HTN, HLD, T2DM, HFpEF admitted from home on 08/31/2022 with cough and lethargy for several days.  Noted recent admit  08/02/2022-08/09/2022 for severe sepsis secondary to MRSA bacteremia likely secondary to pressure ulcers (heel) TTE and TEE during that admission negative, treated with extended course of Daptomycin (PICC placed) course completed 08/31/2022  Noted recent admit 08/22/2022-08/28/2022 for sepsis secondary to sacral pressure ulcer  PICC line removed by [facility] prior to admssion, found to be febrile on their assessment and was advised to present to ED  CXR 8/8 IMPRESSION: Extensive opacities throughout the left lung suspicious for infection given the history.  Being treated appropriately with antibiotic therapy for PNA, concern for aspiration-SLP evaluation recommended dysphagia 2 minced foods with nectar thickened liquids  Palliative medicine was consulted for assisting with goals of care conversations.  Subjective:  Extensive chart review has been completed prior to meeting patient including labs, vital signs, imaging, progress notes, orders, and available advanced directive documents from current and previous encounters.  Visited with [name] at his bedside, awake, alert, sitting up in bed, attempting to feed himself breakfast. Nursing staff at bedside administering AM medications. [name] answers with "yes or no" to simple questions, minimal engagement and flat affect. He declines pain or discomfort. Unable to engage in goals of care conversations  related to underlying cognitive impairment.  Called and spoke with sister-[name].  Introduced myself as a Publishing rights manager as a member of the palliative care team. Explained palliative medicine is specialized medical care for people living with serious illness. It focuses on providing relief from the symptoms and stress of a serious illness. The goal is to improve quality of life for both the patient and the family.   [name] shares she is at work today but requests in person meeting 8/11 AM, will plan to meet at bedside.  PMT will continue to follow and support patient as needed.  Addendum: Made aware from primary team, discharge planned for today. Attempted to call sister-[name] to make aware and cancel meeting planned for tomorrow-call unanswered and unable to leave VM. Primary team to order outpatient palliative care at discharge.  Objective: Primary Diagnoses: Present on Admission:  Sepsis due to pneumonia (HCC)  Dementia (HCC)  Essential hypertension  Chronic diastolic CHF (congestive heart failure) (HCC)  History of pulmonary embolism  BPH (benign prostatic hyperplasia)  Protein-calorie malnutrition, severe  Pressure injury of skin  Controlled type 2 diabetes mellitus with hyperglycemia, without long-term current use of insulin (HCC)  MRSA bacteremia  Hypokalemia   Physical Exam Constitutional:      General: He is not in acute distress.    Appearance: He is ill-appearing.  Pulmonary:     Effort: Pulmonary effort is normal. No respiratory distress.  Neurological:     Mental Status: He is alert.     Motor: Weakness present.  Psychiatric:        Mood and Affect: Affect is flat.     Vital Signs: BP 129/63 (BP Location: Right Arm)   Pulse 74   Temp 98.6 F (37 C) (Oral)   Resp 18  Ht 5\' 6"  (1.676 m)   Wt 54 kg   SpO2 98%   BMI 19.21 kg/m  Pain Scale: 0-10   Pain Score: 0-No pain  IO: Intake/output summary:  Intake/Output Summary (Last 24 hours) at  09/02/2022 5366 Last data filed at 09/02/2022 0915 Gross per 24 hour  Intake 1994.15 ml  Output 1200 ml  Net 794.15 ml    LBM:   Baseline Weight: Weight: 54 kg Most recent weight: Weight: 54 kg      Discussed With: Nursing staff   Thank you for this consult and allowing Palliative Medicine to participate in the care of [name]. [place]. Palliative medicine will continue to follow and assist as needed.   Time Total: 40 minutes  Time spent includes: Detailed review of medical records (labs, imaging, vital signs), medically appropriate exam (mental status, respiratory, cardiac, skin), discussed with treatment team, counseling and educating patient, family and staff, documenting clinical information, medication management and coordination of care.   Signed by: Leeanne Deed, DNP, AGNP-C Palliative Medicine    Please contact Palliative Medicine Team phone at [phone] for questions and concerns.  For individual provider: See Loretha Stapler

## 2022-09-02 NOTE — TOC Transition Note (Addendum)
Transition of Care [facility]) - CM/SW Discharge Note   Patient Details  Name: [name] MRN: [id number] Date of Birth: [birth year]  [facility]) CM/SW Contact:  Allena Katz, LCSW Phone Number: 09/02/2022, 10:27 AM   Clinical Narrative:   Pt has orders to discharge back home with [facility]. Barbara Cower with [facility] notified. Gel overlay for mattress ordered through [facility]. [facility] to deliver today. Medical Necessity printed to unit. CSW to call ems. CSW confirmed with sister darlene that someone would be home to receive pt.    Final next level of care: Home w Home Health Services Barriers to Discharge: Barriers Resolved   Patient Goals and CMS Choice CMS Medicare.gov Compare Post Acute Care list provided to:: Patient    Discharge Placement                  Patient to be transferred to facility by: [facility]      Discharge Plan and Services Additional resources added to the After Visit Summary for                    DME Agency: [employer] Date DME Agency Contacted: 09/01/22   Representative spoke with at DME Agency: [facility] Arranged: RN, OT, PT, Social Work [employer] Agency: [facility] Date HH Agency Contacted: 09/01/22      Social Determinants of Health (SDOH) Interventions SDOH Screenings   Food Insecurity: Patient Declined (08/31/2022)  Housing: Patient Declined (08/31/2022)  Transportation Needs: Patient Declined (08/31/2022)  Utilities: Patient Declined (08/31/2022)  Tobacco Use: High Risk (08/31/2022)     Readmission Risk Interventions    09/01/2022    2:52 PM  Readmission Risk Prevention Plan  Transportation Screening Complete  PCP or Specialist Appt within 3-5 Days Complete  HRI or Home Care Consult Complete  Palliative Care Screening Complete  Medication Review (RN Care Manager) Complete

## 2022-09-02 NOTE — Discharge Summary (Signed)
Physician Discharge Summary   Patient: [name] MRN: [id number] DOB: 03/19/1960  Admit date:     08/31/2022  Discharge date: 09/02/22  Discharge Physician: Arnetha Courser   PCP: Marya Fossa, PA-C   Recommendations at discharge:  Please obtain CBC and BMP in 1 week Please repeat chest x-ray in 3 to 4 weeks to see the resolution of current abnormalities. Please enforce aspiration precautions, patient should be on dysphagia 2 diet with thickened liquids, as he was aspirating on thin liquids with our swallow evaluation. Follow-up with primary care provider within a week Follow-up with outpatient palliative care.  Discharge Diagnoses: Principal Problem:   Sepsis due to pneumonia [facility]) Active Problems:   MRSA bacteremia   Dementia (HCC)   Essential hypertension   Controlled type 2 diabetes mellitus with hyperglycemia, without long-term current use of insulin (HCC)   Seizure disorder (HCC)   History of CVA (cerebrovascular accident)   Chronic diastolic CHF (congestive heart failure) (HCC)   History of pulmonary embolism   Pressure injury of skin   BPH (benign prostatic hyperplasia)   Protein-calorie malnutrition, severe   Hypokalemia   Hospital Course: [name] is a 62 y.o. male with medical history significant of advanced dementia, recent MRSA bacteremia 2/2 left heel ulcer on daptomycin, CVA with residual right hemiparesis, PAD s/p right AKA, previous PE on Eliquis, hypertension, hyperlipidemia, type 2 diabetes, HFpEF, and recent MRSA bacteremia, completed the course of antibiotic with daptomycin today and PICC line was removed by home health nurse this morning, was brought to ED with concern of worsening cough and lethargy for the past couple of days.   History of multiple recent hospitalizations for different infections. Recently completed the course of antibiotic with IV daptomycin for MRSA bacteremia.  Patient met sepsis criteria, likely secondary to left-sided  pneumonia. Started on cefepime, Zithromax and vancomycin.  8/9: Afebrile and on room air at this morning, MRSA PCR came back negative so vancomycin is being discontinued.  Improving leukocytosis and decrease in hemoglobin, likely secondary to IV fluid as all cell lines decreased.  Potassium at 3 which is being repleted.  Preliminary blood cultures negative, strep pneumo negative.  Procalcitonin 0.43. Swallow evaluation with coughing on thin liquid, they were recommending thickened liquid with dysphagia 2 diet.  Patient with steady decline and multiple hospitalizations. Antibiotics switched with Unasyn and Zithromax. Palliative care was also consulted. PT and OT are recommending rehab.  8/10: Hemodynamically stable, remained on room air.  Unfortunately cannot go to rehab due to insurance issues and already has used his days.  Leukocytosis resolved, persistent hypokalemia which is being repleted again.  Urine cultures with no growth and blood cultures remain negative.  Patient is being discharged on Augmentin and Zithromax.  He should remain on dysphagia 2 diet with thick liquids.  Will resume home health services on discharge.  Patient will remain high risk for readmission, deterioration and mortality based on poor underlying functional status and multiple comorbidities.  We did consulted palliative care but unable to have a family meeting due to limited time available by family member.  Patient will get benefit from seeing an outpatient palliative care to discuss further goals of care.  Patient is dependent for ADLs.  Clinically significantly improved.  Patient should follow precautions to prevent recurrent aspiration.   Patient will continue on current medications and need to have a close follow-up with his providers for further recommendations.  Assessment and Plan: * Sepsis due to pneumonia [facility]) Patient met sepsis  criteria with fever, mild tachycardia, tachypnea and leukocytosis.  Likely  secondary to pneumonia as evident on chest x-ray.   Received IV fluid and broad-spectrum antibiotics in ED Swallow evaluation with concern of aspiration on thin liquid-dysphagia diet with thickened liquid ordered.  Preliminary blood cultures negative, strep pneumo negative, Legionella pending.  Urine cultures pending. MRSA PCR negative. Stop vancomycin -Switch antibiotics to Unasyn and Zithromax -Supportive care  MRSA bacteremia Patient with history of recent MRSA bacteremia and completed the course of antibiotics with daptomycin on 08/31/2022.  PICC line was removed.  TTE and TEE during that admission was negative.  Dementia (HCC) He appears at his baseline. Delirium precautions  Essential hypertension Blood pressure currently within goal. Will hold home amlodipine today as patient is being admitted with sepsis. -We can resume if blood pressure started trending up  Controlled type 2 diabetes mellitus with hyperglycemia, without long-term current use of insulin (HCC) Last A1c of 6.4.  Uses metformin at home -SSI   Seizure disorder (HCC) -Continue home Keppra  History of CVA (cerebrovascular accident) Patient has right hemiparesis. -Continue home aspirin and statin  Chronic diastolic CHF (congestive heart failure) (HCC) Appears euvolemic. -Monitor volume status as patient received IV fluid  History of pulmonary embolism -Continue home Eliquis  Pressure injury of skin Patient with history of unstageable sacral pressure injuries. Per ED provider does not look infected -Wound consult  BPH (benign prostatic hyperplasia) -Continue home finasteride and Flomax  Protein-calorie malnutrition, severe -Dietitian consult  Hypokalemia Potassium at 3, magnesium normal -Replete potassium and monitor       Consultants: Palliative care Procedures performed: None Disposition: Home health Diet recommendation:  Discharge Diet Orders (From admission, onward)     Start      Ordered   09/02/22 0000  Diet - low sodium heart healthy        09/02/22 1020           Dysphagia type 2 nectar thick Liquid DISCHARGE MEDICATION: Allergies as of 09/02/2022   No Known Allergies      Medication List     STOP taking these medications    amLODipine 10 MG tablet Commonly known as: NORVASC   daptomycin IVPB Commonly known as: CUBICIN       TAKE these medications    acetaminophen 325 MG tablet Commonly known as: TYLENOL Take 650 mg by mouth every 6 (six) hours as needed.   amoxicillin-clavulanate 875-125 MG tablet Commonly known as: AUGMENTIN Take 1 tablet by mouth 2 (two) times daily for 4 days.   apixaban 5 MG Tabs tablet Commonly known as: ELIQUIS Take 1 tablet (5 mg total) by mouth 2 (two) times daily.   ascorbic acid 500 MG tablet Commonly known as: VITAMIN C Take 500 mg by mouth 2 (two) times daily.   aspirin EC 81 MG tablet Take 1 tablet (81 mg total) by mouth daily. Swallow whole.   atorvastatin 40 MG tablet Commonly known as: LIPITOR Take 1 tablet (40 mg total) by mouth daily at 6 PM.   azithromycin 500 MG tablet Commonly known as: ZITHROMAX Take 1 tablet (500 mg total) by mouth daily for 3 days.   finasteride 5 MG tablet Commonly known as: PROSCAR Take 5 mg by mouth daily.   leptospermum manuka honey Pste paste Apply 1 Application topically daily.   levETIRAcetam 500 MG tablet Commonly known as: KEPPRA Take 1 tablet (500 mg total) by mouth 2 (two) times daily.   metFORMIN 500 MG tablet Commonly known as:  GLUCOPHAGE Take 1 tablet (500 mg total) by mouth 2 (two) times daily with a meal.   nutrition supplement (JUVEN) Pack Take 1 packet by mouth 2 (two) times daily between meals.   feeding supplement (NEPRO CARB STEADY) Liqd Take 237 mLs by mouth 3 (three) times daily between meals.   tamsulosin 0.4 MG Caps capsule Commonly known as: FLOMAX Take 1 capsule (0.4 mg total) by mouth daily after supper.                Durable Medical Equipment  (From admission, onward)           Start     Ordered   09/01/22 1527  For home use only DME Other see comment  Once       Comments: Gel overlay  Question:  Length of Need  Answer:  Lifetime   09/01/22 1527              Discharge Care Instructions  (From admission, onward)           Start     Ordered   09/02/22 0000  Discharge wound care:       Comments: Apply Medihoney to right lower buttock, sacrum, left anterior foot, left outer foot Q day, then cover with foam dressing.  Change foam dressing Q 3 days or PRN soiling.   09/02/22 1020            Follow-up Information     Marya Fossa, PA-C. Schedule an appointment as soon as possible for a visit in 1 week(s).   Specialty: Physician Assistant Contact information: [address] [place] [address] [phone]                Discharge Exam: Ceasar Mons Weights   08/31/22 1426  Weight: 54 kg   General. Frail gentleman, in no acute distress. Pulmonary.  Lungs clear bilaterally, normal respiratory effort. CV.  Regular rate and rhythm, no JVD, rub or murmur. Abdomen.  Soft, nontender, nondistended, BS positive. CNS.  Alert and oriented .  No focal neurologic deficit. Extremities.  Right AKA, no edema on left Psychiatry.  Appears to have some cognitive impairment.   Condition at discharge: stable  The results of significant diagnostics from this hospitalization (including imaging, microbiology, ancillary and laboratory) are listed below for reference.   Imaging Studies: DG Chest Port 1 View  Result Date: 08/31/2022 CLINICAL DATA:  Possible infected PICC line, concern for sepsis. EXAM: PORTABLE CHEST 1 VIEW COMPARISON:  Chest radiograph 08/22/2022 FINDINGS: The cardiomediastinal silhouette is normal, allowing for patient rotation. There are extensive opacities throughout the left lung which are new since the prior study. The right lung is clear. There is no  significant pleural effusion. There is no pneumothorax There is no acute osseous abnormality. IMPRESSION: Extensive opacities throughout the left lung suspicious for infection given the history. Electronically Signed   By: Lesia Hausen M.D.   On: 08/31/2022 15:14   DG Chest Port 1 View  Result Date: 08/22/2022 CLINICAL DATA:  [id number] Sepsis [facility]) [id number] EXAM: PORTABLE CHEST 1 VIEW CXR 12/30/21 FINDINGS: No pleural effusion. No pneumothorax. No focal airspace opacity. Normal cardiac and mediastinal contours. No radiographically apparent displaced rib fractures. Visualized upper abdomen is unremarkable. Prominent bilateral interstitial opacities likely represent bronchovascular crowding in the setting of low lung volumes. IMPRESSION: No focal airspace opacity. Electronically Signed   By: Lorenza Cambridge M.D.   On: 08/22/2022 16:14   [place] EKG SITE RITE  Result Date:  08/08/2022 If Site Rite image not attached, placement could not be confirmed due to current cardiac rhythm.  ECHO TEE  Result Date: 08/08/2022    TRANSESOPHOGEAL ECHO REPORT   Patient Name:   PRABHDEEP KIRKER Date of Exam: 08/08/2022 Medical Rec #:  [id number]        Height:       67.0 in Accession #:    [id number]       Weight:       129.0 lb Date of Birth:  Jul 21, 1960        BSA:          1.678 m Patient Age:    62 years         BP:           122/67 mmHg Patient Gender: M                HR:           66 bpm. Exam Location:  [facility] Procedure: Transesophageal Echo, Color Doppler, Cardiac Doppler and Saline            Contrast Bubble Study Indications:     Bacteremia R78.81  History:         Patient has prior history of Echocardiogram examinations, most                  recent 08/05/2022. Stroke; Risk Factors:Hypertension and                  Diabetes.  Sonographer:     Cristela Blue Referring Phys:  [id number] SHERI HAMMOCK Diagnosing Phys: Debbe Odea MD PROCEDURE: The transesophogeal probe was passed without difficulty through the esophogus of the  patient. Sedation performed by performing physician. The patient developed no complications during the procedure.  IMPRESSIONS  1. Left ventricular ejection fraction, by estimation, is 55 to 60%. The left ventricle has normal function.  2. Right ventricular systolic function is normal. The right ventricular size is normal.  3. No left atrial/left atrial appendage thrombus was detected.  4. The mitral valve is normal in structure. Trivial mitral valve regurgitation.  5. The aortic valve is tricuspid. Aortic valve regurgitation is mild to moderate. Mild aortic valve stenosis. Aortic valve mean gradient measures 10.0 mmHg. Aortic valve Vmax measures 2.06 m/s.  6. Agitated saline contrast bubble study was negative, with no evidence of any interatrial shunt. Conclusion(s)/Recommendation(s): No evidence of vegetation/infective endocarditis on this transesophageael echocardiogram. FINDINGS  Left Ventricle: Left ventricular ejection fraction, by estimation, is 55 to 60%. The left ventricle has normal function. The left ventricular internal cavity size was normal in size. Right Ventricle: The right ventricular size is normal. No increase in right ventricular wall thickness. Right ventricular systolic function is normal. Left Atrium: Left atrial size was normal in size. No left atrial/left atrial appendage thrombus was detected. Right Atrium: Right atrial size was normal in size. Pericardium: There is no evidence of pericardial effusion. Mitral Valve: The mitral valve is normal in structure. Trivial mitral valve regurgitation. Tricuspid Valve: The tricuspid valve is normal in structure. Tricuspid valve regurgitation is mild. Aortic Valve: The aortic valve is tricuspid. Aortic valve regurgitation is mild to moderate. Mild aortic stenosis is present. Aortic valve mean gradient measures 10.0 mmHg. Aortic valve peak gradient measures 17.0 mmHg. Pulmonic Valve: The pulmonic valve was normal in structure. Pulmonic valve  regurgitation is not visualized. Aorta: The aortic root is normal in size and structure. IAS/Shunts: No atrial level shunt detected by  color flow Doppler. Agitated saline contrast was given intravenously to evaluate for intracardiac shunting. Agitated saline contrast bubble study was negative, with no evidence of any interatrial shunt.  AORTIC VALVE AV Vmax:      206.00 cm/s AV Vmean:     145.000 cm/s AV VTI:       0.413 m AV Peak Grad: 17.0 mmHg AV Mean Grad: 10.0 mmHg Debbe Odea MD Electronically signed by Debbe Odea MD Signature Date/Time: 08/08/2022/10:34:07 AM    Final    ECHOCARDIOGRAM COMPLETE  Result Date: 08/05/2022    ECHOCARDIOGRAM REPORT   Patient Name:   JESHUA GALLIER Date of Exam: 08/05/2022 Medical Rec #:  [id number]        Height:       67.0 in Accession #:    [id number]       Weight:       129.0 lb Date of Birth:  [birth year]        BSA:          1.678 m Patient Age:    62 years         BP:           135/72 mmHg Patient Gender: M                HR:           73 bpm. Exam Location:  [facility] Procedure: 2D Echo Indications:     Bacteremia R78.81  History:         Patient has prior history of Echocardiogram examinations, most                  recent 04/26/2021.  Sonographer:     Overton Mam RDCS, FASE Referring Phys:  [id number] Lynn Ito Diagnosing Phys: Chilton Si MD IMPRESSIONS  1. Left ventricular ejection fraction, by estimation, is 55 to 60%. The left ventricle has normal function. The left ventricle has no regional wall motion abnormalities. There is mild concentric left ventricular hypertrophy. Left ventricular diastolic parameters are consistent with Grade I diastolic dysfunction (impaired relaxation).  2. Right ventricular systolic function is normal. The right ventricular size is normal.  3. The mitral valve is normal in structure. Mild mitral valve regurgitation. No evidence of mitral stenosis.  4. The aortic valve is tricuspid. There is moderate calcification  of the aortic valve. There is moderate thickening of the aortic valve. Aortic valve regurgitation is mild to moderate. Mild aortic valve stenosis. Aortic regurgitation PHT measures 415 msec. Aortic valve area, by VTI measures 2.11 cm. Aortic valve mean gradient measures 11.0 mmHg. Aortic valve Vmax measures 2.22 m/s. FINDINGS  Left Ventricle: Left ventricular ejection fraction, by estimation, is 55 to 60%. The left ventricle has normal function. The left ventricle has no regional wall motion abnormalities. The left ventricular internal cavity size was normal in size. There is  mild concentric left ventricular hypertrophy. Left ventricular diastolic parameters are consistent with Grade I diastolic dysfunction (impaired relaxation). Normal left ventricular filling pressure. Right Ventricle: The right ventricular size is normal. No increase in right ventricular wall thickness. Right ventricular systolic function is normal. Left Atrium: Left atrial size was normal in size. Right Atrium: Right atrial size was normal in size. Pericardium: There is no evidence of pericardial effusion. Mitral Valve: The mitral valve is normal in structure. Mild mitral valve regurgitation. No evidence of mitral valve stenosis. Tricuspid Valve: The tricuspid valve is normal in structure. Tricuspid valve regurgitation is trivial. No evidence of tricuspid  stenosis. Aortic Valve: The aortic valve is tricuspid. There is moderate calcification of the aortic valve. There is moderate thickening of the aortic valve. Aortic valve regurgitation is mild to moderate. Aortic regurgitation PHT measures 415 msec. Mild aortic stenosis is present. Aortic valve mean gradient measures 11.0 mmHg. Aortic valve peak gradient measures 19.7 mmHg. Aortic valve area, by VTI measures 2.11 cm. Pulmonic Valve: The pulmonic valve was normal in structure. Pulmonic valve regurgitation is not visualized. No evidence of pulmonic stenosis. Aorta: The aortic root is normal in  size and structure. Venous: The inferior vena cava was not well visualized. IAS/Shunts: No atrial level shunt detected by color flow Doppler.  LEFT VENTRICLE PLAX 2D LVIDd:         4.40 cm   Diastology LVIDs:         3.10 cm   LV e' medial:    10.10 cm/s LV PW:         1.10 cm   LV E/e' medial:  7.8 LV IVS:        1.20 cm   LV e' lateral:   12.90 cm/s LVOT diam:     2.10 cm   LV E/e' lateral: 6.1 LV SV:         86 LV SV Index:   51 LVOT Area:     3.46 cm  RIGHT VENTRICLE RV Basal diam:  2.00 cm RV S prime:     15.70 cm/s TAPSE (M-mode): 2.2 cm LEFT ATRIUM             Index        RIGHT ATRIUM          Index LA diam:        3.00 cm 1.79 cm/m   RA Area:     9.99 cm LA Vol (A2C):   48.9 ml 29.14 ml/m  RA Volume:   20.10 ml 11.98 ml/m LA Vol (A4C):   29.7 ml 17.70 ml/m LA Biplane Vol: 37.8 ml 22.52 ml/m  AORTIC VALVE                     PULMONIC VALVE AV Area (Vmax):    1.89 cm      PV Vmax:       1.17 m/s AV Area (Vmean):   1.76 cm      PV Peak grad:  5.5 mmHg AV Area (VTI):     2.11 cm AV Vmax:           222.00 cm/s AV Vmean:          153.000 cm/s AV VTI:            0.409 m AV Peak Grad:      19.7 mmHg AV Mean Grad:      11.0 mmHg LVOT Vmax:         121.00 cm/s LVOT Vmean:        77.800 cm/s LVOT VTI:          0.249 m LVOT/AV VTI ratio: 0.61 AI PHT:            415 msec  AORTA Ao Root diam: 3.50 cm Ao Asc diam:  3.50 cm MITRAL VALVE               TRICUSPID VALVE MV Area (PHT): 3.16 cm    TR Peak grad:   27.0 mmHg MV Decel Time: 240 msec    TR Vmax:  260.00 cm/s MV E velocity: 79.10 cm/s MV A velocity: 91.20 cm/s  SHUNTS MV E/A ratio:  0.87        Systemic VTI:  0.25 m                            Systemic Diam: 2.10 cm Chilton Si MD Electronically signed by Chilton Si MD Signature Date/Time: 08/05/2022/2:57:06 PM    Final     Microbiology: Results for orders placed or performed during the hospital encounter of 08/31/22  Resp panel by RT-PCR (RSV, Flu A&B, Covid) Anterior Nasal Swab      Status: None   Collection Time: 08/31/22  2:48 PM   Specimen: Anterior Nasal Swab  Result Value Ref Range Status   SARS Coronavirus 2 by RT PCR NEGATIVE NEGATIVE Final    Comment: (NOTE) SARS-CoV-2 target nucleic acids are NOT DETECTED.  The SARS-CoV-2 RNA is generally detectable in upper respiratory specimens during the acute phase of infection. The lowest concentration of SARS-CoV-2 viral copies this assay can detect is 138 copies/mL. A negative result does not preclude SARS-Cov-2 infection and should not be used as the sole basis for treatment or other patient management decisions. A negative result may occur with  improper specimen collection/handling, submission of specimen other than nasopharyngeal swab, presence of viral mutation(s) within the areas targeted by this assay, and inadequate number of viral copies(<138 copies/mL). A negative result must be combined with clinical observations, patient history, and epidemiological information. The expected result is Negative.  Fact Sheet for Patients:  BloggerCourse.com  Fact Sheet for Healthcare Providers:  SeriousBroker.it  This test is no t yet approved or cleared by the [place] FDA and  has been authorized for detection and/or diagnosis of SARS-CoV-2 by FDA under an Emergency Use Authorization (EUA). This EUA will remain  in effect (meaning this test can be used) for the duration of the COVID-19 declaration under Section 564(b)(1) of the Act, 21 U.S.C.section 360bbb-3(b)(1), unless the authorization is terminated  or revoked sooner.       Influenza A by PCR NEGATIVE NEGATIVE Final   Influenza B by PCR NEGATIVE NEGATIVE Final    Comment: (NOTE) The Xpert Xpress SARS-CoV-2/FLU/RSV plus assay is intended as an aid in the diagnosis of influenza from Nasopharyngeal swab specimens and should not be used as a sole basis for treatment. Nasal washings and aspirates are  unacceptable for Xpert Xpress SARS-CoV-2/FLU/RSV testing.  Fact Sheet for Patients: BloggerCourse.com  Fact Sheet for Healthcare Providers: SeriousBroker.it  This test is not yet approved or cleared by the [place] FDA and has been authorized for detection and/or diagnosis of SARS-CoV-2 by FDA under an Emergency Use Authorization (EUA). This EUA will remain in effect (meaning this test can be used) for the duration of the COVID-19 declaration under Section 564(b)(1) of the Act, 21 U.S.C. section 360bbb-3(b)(1), unless the authorization is terminated or revoked.     Resp Syncytial Virus by PCR NEGATIVE NEGATIVE Final    Comment: (NOTE) Fact Sheet for Patients: BloggerCourse.com  Fact Sheet for Healthcare Providers: SeriousBroker.it  This test is not yet approved or cleared by the [place] FDA and has been authorized for detection and/or diagnosis of SARS-CoV-2 by FDA under an Emergency Use Authorization (EUA). This EUA will remain in effect (meaning this test can be used) for the duration of the COVID-19 declaration under Section 564(b)(1) of the Act, 21 U.S.C. section 360bbb-3(b)(1), unless the authorization is terminated  or revoked.  Performed at [facility], [address] [place], [place] [address]   Blood Culture (routine x 2)     Status: None (Preliminary result)   Collection Time: 08/31/22  2:48 PM   Specimen: BLOOD  Result Value Ref Range Status   Specimen Description BLOOD LEFT FA  Final   Special Requests   Final    BOTTLES DRAWN AEROBIC AND ANAEROBIC Blood Culture results may not be optimal due to an excessive volume of blood received in culture bottles   Culture   Final    NO GROWTH 2 DAYS Performed at [facility], [address] [place], [place] [address]    Report Status PENDING  Incomplete  Blood Culture (routine x 2)      Status: None (Preliminary result)   Collection Time: 08/31/22  2:49 PM   Specimen: BLOOD  Result Value Ref Range Status   Specimen Description BLOOD RIGHT HAND  Final   Special Requests   Final    BOTTLES DRAWN AEROBIC AND ANAEROBIC Blood Culture adequate volume   Culture   Final    NO GROWTH 2 DAYS Performed at [facility], [address], [place], [place] [address]    Report Status PENDING  Incomplete  Urine Culture (for pregnant, neutropenic or urologic patients or patients with an indwelling urinary catheter)     Status: None   Collection Time: 08/31/22  4:43 PM   Specimen: Urine, Clean Catch  Result Value Ref Range Status   Specimen Description   Final    URINE, CLEAN CATCH Performed at [facility], [address] [place], [place] [address]    Special Requests   Final    Normal Performed at [facility], [address] [place], [place] [address]    Culture   Final    NO GROWTH Performed at [facility], [address] [place], [place] [address]    Report Status 09/01/2022 FINAL  Final  MRSA Next Gen by PCR, Nasal     Status: None   Collection Time: 08/31/22  5:32 PM   Specimen: Nasal Mucosa; Nasal Swab  Result Value Ref Range Status   MRSA by PCR Next Gen NOT DETECTED NOT DETECTED Final    Comment: (NOTE) The GeneXpert MRSA Assay (FDA approved for NASAL specimens only), is one component of a comprehensive MRSA colonization surveillance program. It is not intended to diagnose MRSA infection nor to guide or monitor treatment for MRSA infections. Test performance is not FDA approved in patients less than 24 years old. Performed at [facility], [address] [place], [place] [address]     Labs: CBC: Recent Labs  Lab 08/27/22 0425 08/28/22 0351 08/31/22 1432 09/01/22 0506 09/02/22 0501  WBC 6.1 6.9 14.4* 12.5* 9.7  NEUTROABS  --   --  13.0*  --   --   HGB 9.3* 10.1* 10.0* 8.2* 8.2*  HCT 28.5* 29.8* 30.8*  25.6* 25.9*  MCV 87.2 87.1 88.0 89.8 89.6  PLT 269 290 332 254 244   Basic Metabolic Panel: Recent Labs  Lab 08/27/22 1147 08/28/22 0351 08/31/22 1432 09/01/22 0506 09/02/22 0501  NA 136 134* 137 140 140  K 3.3* 3.4* 3.6 3.0* 3.0*  CL 100 99 103 108 109  CO2 26 26 24 24 25   GLUCOSE 127* 133* 291* 125* 112*  BUN 12 10 43* 28* 17  CREATININE 0.44* 0.40* 0.74 0.50* 0.43*  CALCIUM 8.3* 8.5* 8.2* 8.1*  8.3*  MG 2.0 2.0  --  1.9  --   PHOS  --   --   --   --  3.0   Liver Function Tests: Recent Labs  Lab 08/31/22 1432  AST 27  ALT 20  ALKPHOS 63  BILITOT 0.6  PROT 8.3*  ALBUMIN 2.6*   CBG: Recent Labs  Lab 09/01/22 0746 09/01/22 1121 09/01/22 1549 09/01/22 2120 09/02/22 0849  GLUCAP 109* 185* 136* 126* 110*    Discharge time spent: greater than 30 minutes.  This record has been created using Conservation officer, historic buildings. Errors have been sought and corrected,but may not always be located. Such creation errors do not reflect on the standard of care.   Signed: Arnetha Courser, MD [facility] 09/02/2022

## 2022-10-22 IMAGING — MR MR FOOT*R* W/O CM
5 series · 40 of 40 positions shown · non-contrast
Comparison: Small radiograph 05/25/2021, right foot MRI 04/27/2021

CLINICAL DATA: Bone mass or bone pain, foot, aggressive features on
xray

EXAM:
MRI OF THE RIGHT FOREFOOT WITHOUT CONTRAST
TECHNIQUE: Multiplanar, multisequence MR imaging of the right forefoot was
performed. No intravenous contrast was administered.

[Series 3: T1 · coronal · right · 3.0mm · 0.47mm/px · 10 of 38 slices shown (1 of 2)]
[im 1/38]
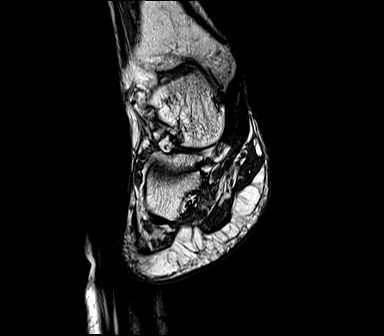
[im 5/38]
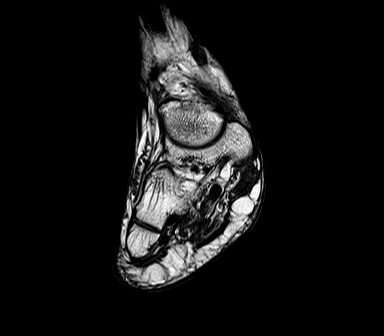
[im 9/38]
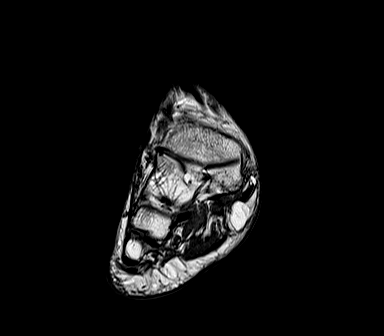
[im 13/38]
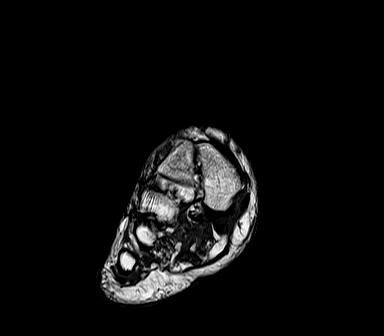
[im 17/38]
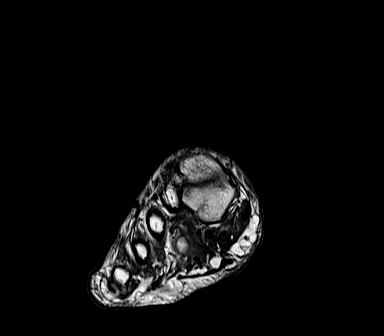
[im 21/38]
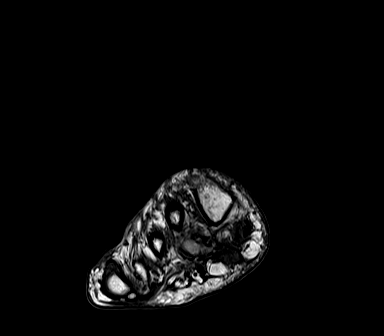
[im 25/38]
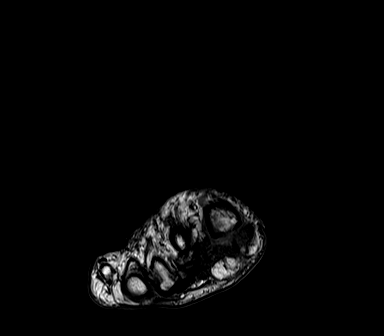
[im 29/38]
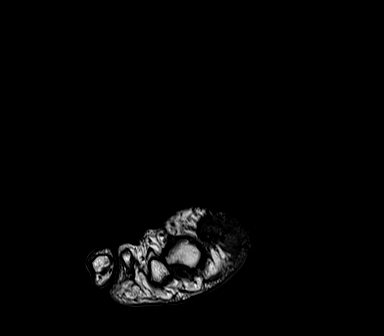
[im 33/38]
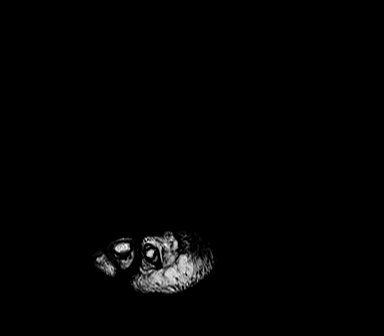
[im 38/38]
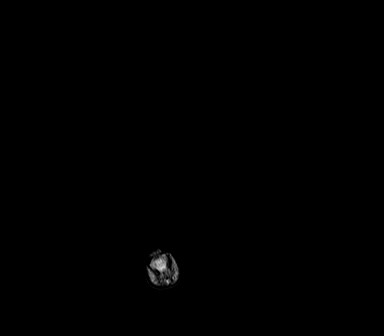

[Series 4: T2 fat-sat · coronal · right · 3.0mm · 0.49mm/px · 10 of 38 slices shown (1 of 2)]
[im 1/38]
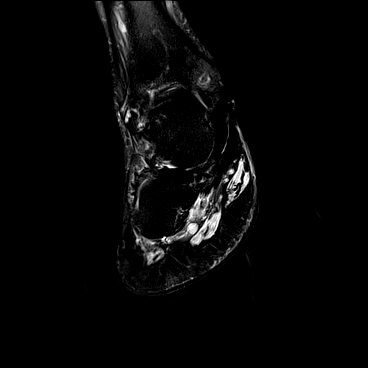
[im 5/38]
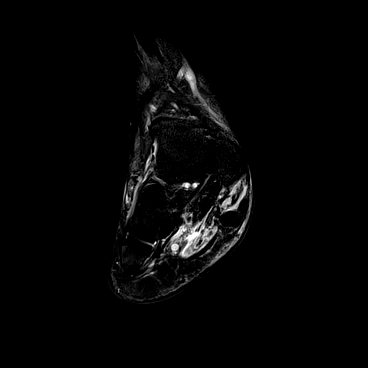
[im 9/38]
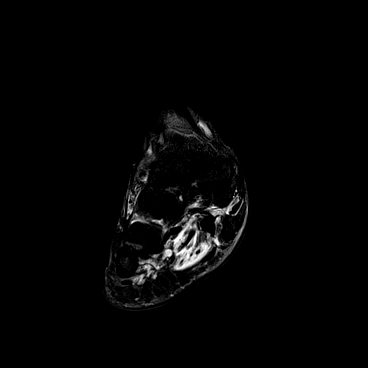
[im 13/38]
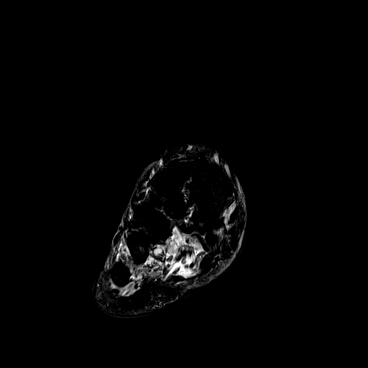
[im 17/38]
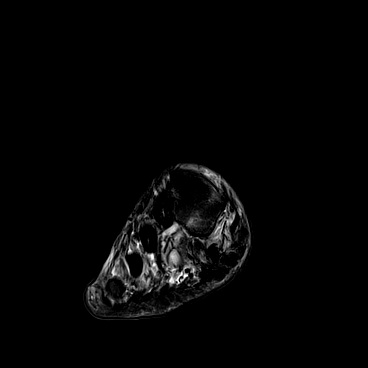
[im 21/38]
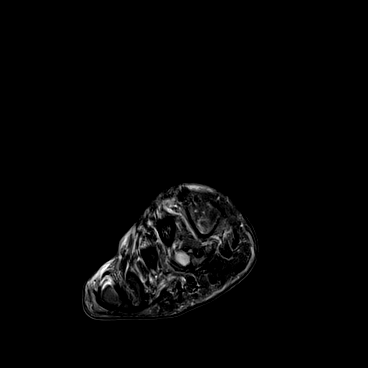
[im 25/38]
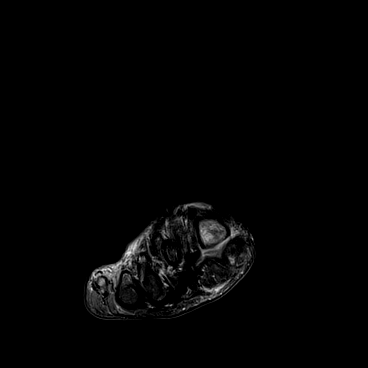
[im 29/38]
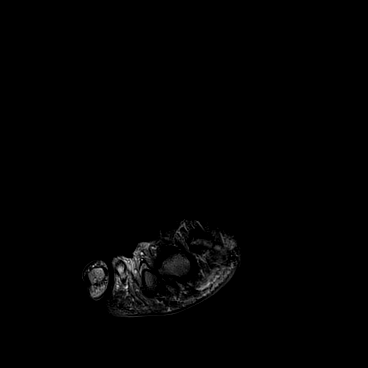
[im 33/38]
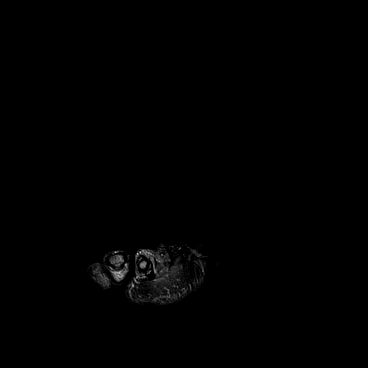
[im 38/38]
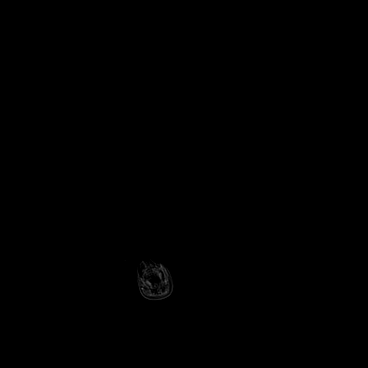

[Series 6: T1 · axial · right · 3.0mm · 0.52mm/px · z∈[-121,-44]mm · 6 of 24 slices shown (2 of 2)]
[im 1/24]
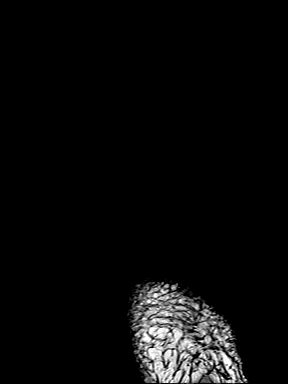
[im 5/24]
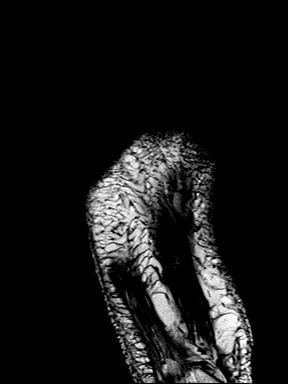
[im 10/24]
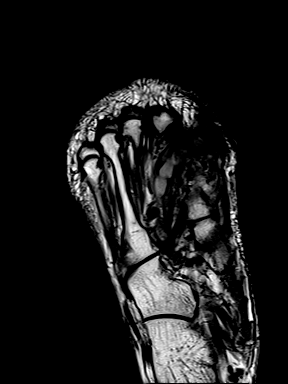
[im 14/24]
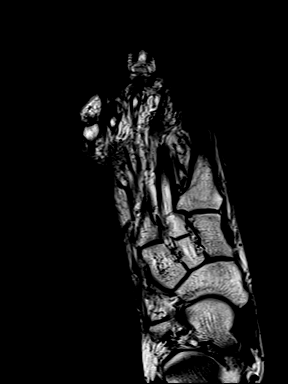
[im 19/24]
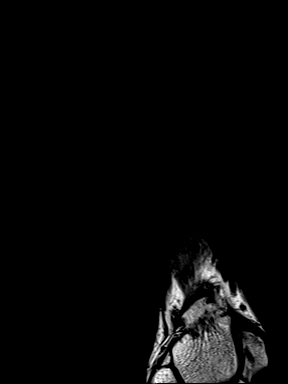
[im 24/24]
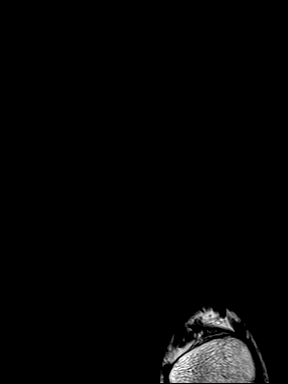

[Series 7: T2 fat-sat · axial · right · 3.0mm · 0.60mm/px · z∈[-132,-54]mm · 6 of 24 slices shown (2 of 2)]
[im 1/24]
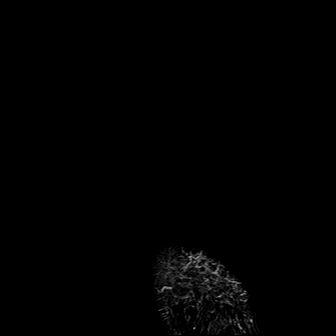
[im 5/24]
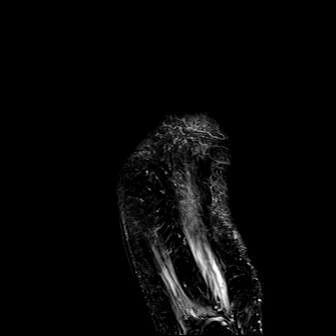
[im 10/24]
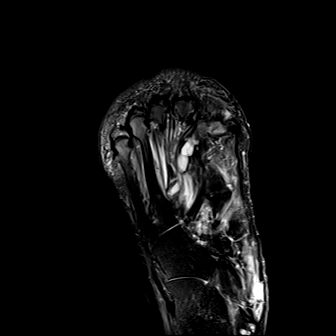
[im 14/24]
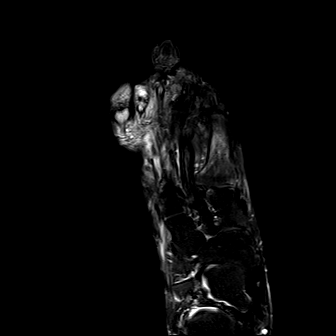
[im 19/24]
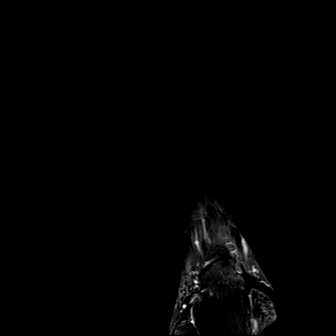
[im 24/24]
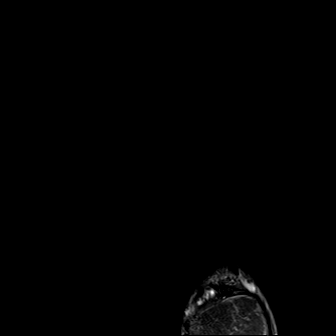

[Series 8: STIR · sagittal · right · 3.0mm · 0.70mm/px · 8 of 30 slices shown]
[im 1/30]
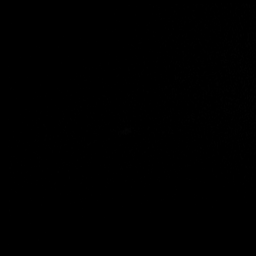
[im 5/30]
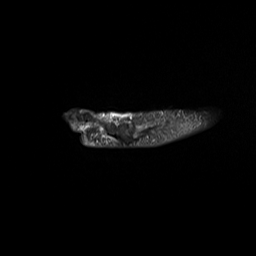
[im 9/30]
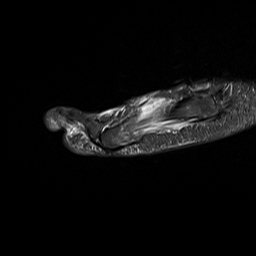
[im 13/30]
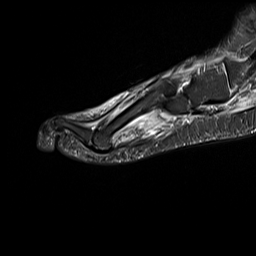
[im 17/30]
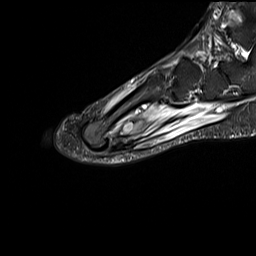
[im 21/30]
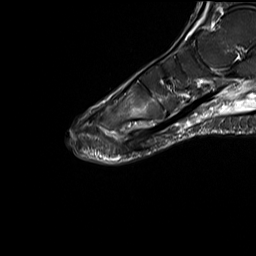
[im 25/30]
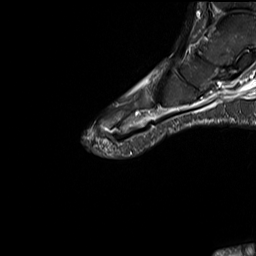
[im 30/30]
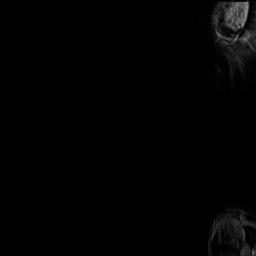

[40 of 40 positions shown; findings below may reference images not displayed]

FINDINGS: Bones/Joint/Cartilage

Prior great toe amputation and recent first metatarsal head
resection. There is marrow edema within the residual first
metatarsal, with low T1 signal at the distal resection margin.

Prior second toe amputation. Unchanged appearance of the second
metatarsal head with small erosions on the margins, and otherwise no
significant marrow edema or low T1 signal.

There is no other significant marrow signal alteration in the
forefoot.

Ligaments

Intact Lisfranc ligament.

Muscles and Tendons

Diffuse intramuscular edema muscle atrophy in the foot as is
commonly seen in diabetics. There is a collection of fluid along the
expected course of the adductor hallucis tendon, which is likely
tenosynovial fluid related to the previous first toe amputation
(series 7, image 15).

Soft tissues

Diffuse soft tissue swelling of the foot. There is no definite
drainable collection underlying the amputation site.
IMPRESSION: Prior great toe amputation and recent first metatarsal head
resection. There is marrow edema and some low T1 signal at the
distal resection margin in the residual first metatarsal, favored to
represent postsurgical changes due to recent amputation, though
developing osteomyelitis could have a similar appearance.

Prior second toe amputation. Unchanged appearance of the second
metatarsal head without evidence of osteomyelitis.

Fluid collecting along the expected course of the adductor hallucis
tendon, likely reactive tenosynovial fluid related to the recent
amputation. Forefoot soft tissue swelling without other evidence of
well-defined/drainable fluid collection underlying the amputation
site.

## 2022-11-24 DEATH — deceased
